# Patient Record
Sex: Female | Born: 1955 | Race: White | Hispanic: No | Marital: Married | Smoking: Former smoker
Health system: Southern US, Community
[De-identification: ages and names within clinical notes are randomized; demographics above are authoritative.]

## PROBLEM LIST (undated history)

## (undated) DIAGNOSIS — F17200 Nicotine dependence, unspecified, uncomplicated: Secondary | ICD-10-CM

## (undated) DIAGNOSIS — E785 Hyperlipidemia, unspecified: Secondary | ICD-10-CM

## (undated) DIAGNOSIS — M109 Gout, unspecified: Secondary | ICD-10-CM

## (undated) DIAGNOSIS — I7409 Other arterial embolism and thrombosis of abdominal aorta: Secondary | ICD-10-CM

## (undated) DIAGNOSIS — M810 Age-related osteoporosis without current pathological fracture: Secondary | ICD-10-CM

## (undated) DIAGNOSIS — E119 Type 2 diabetes mellitus without complications: Secondary | ICD-10-CM

## (undated) DIAGNOSIS — I10 Essential (primary) hypertension: Secondary | ICD-10-CM

## (undated) DIAGNOSIS — I350 Nonrheumatic aortic (valve) stenosis: Secondary | ICD-10-CM

## (undated) DIAGNOSIS — K5792 Diverticulitis of intestine, part unspecified, without perforation or abscess without bleeding: Secondary | ICD-10-CM

## (undated) DIAGNOSIS — E538 Deficiency of other specified B group vitamins: Secondary | ICD-10-CM

## (undated) DIAGNOSIS — G4730 Sleep apnea, unspecified: Secondary | ICD-10-CM

## (undated) DIAGNOSIS — K56609 Unspecified intestinal obstruction, unspecified as to partial versus complete obstruction: Secondary | ICD-10-CM

## (undated) DIAGNOSIS — R011 Cardiac murmur, unspecified: Secondary | ICD-10-CM

## (undated) DIAGNOSIS — M1990 Unspecified osteoarthritis, unspecified site: Secondary | ICD-10-CM

## (undated) HISTORY — DX: Sleep apnea, unspecified: G47.30

## (undated) HISTORY — DX: Age-related osteoporosis without current pathological fracture: M81.0

## (undated) HISTORY — DX: Deficiency of other specified B group vitamins: E53.8

## (undated) HISTORY — PX: OTHER SURGICAL HISTORY: SHX169

## (undated) HISTORY — DX: Hyperlipidemia, unspecified: E78.5

## (undated) HISTORY — DX: Nicotine dependence, unspecified, uncomplicated: F17.200

## (undated) HISTORY — PX: JOINT REPLACEMENT: SHX530

## (undated) HISTORY — DX: Essential (primary) hypertension: I10

## (undated) HISTORY — PX: ELBOW SURGERY: SHX618

## (undated) HISTORY — DX: Other arterial embolism and thrombosis of abdominal aorta: I74.09

## (undated) HISTORY — DX: Unspecified intestinal obstruction, unspecified as to partial versus complete obstruction: K56.609

## (undated) MED FILL — Ezetimibe Tab 10 MG: ORAL | Fill #4 | Status: CN

## (undated) MED FILL — Ezetimibe Tab 10 MG: ORAL | Fill #3 | Status: CN

---

## 1998-01-18 ENCOUNTER — Ambulatory Visit (HOSPITAL_COMMUNITY): Admission: RE | Admit: 1998-01-18 | Discharge: 1998-01-18 | Payer: Self-pay | Admitting: Family Medicine

## 1998-05-07 HISTORY — PX: OTHER SURGICAL HISTORY: SHX169

## 1998-07-07 ENCOUNTER — Encounter: Payer: Self-pay | Admitting: Vascular Surgery

## 1998-07-08 ENCOUNTER — Ambulatory Visit: Admission: RE | Admit: 1998-07-08 | Discharge: 1998-07-08 | Payer: Self-pay | Admitting: Vascular Surgery

## 1998-08-03 ENCOUNTER — Encounter: Payer: Self-pay | Admitting: Vascular Surgery

## 1998-08-03 ENCOUNTER — Inpatient Hospital Stay: Admission: RE | Admit: 1998-08-03 | Discharge: 1998-08-07 | Payer: Self-pay | Admitting: Vascular Surgery

## 1998-08-04 ENCOUNTER — Encounter: Payer: Self-pay | Admitting: Vascular Surgery

## 2000-06-24 ENCOUNTER — Emergency Department (HOSPITAL_COMMUNITY): Admission: EM | Admit: 2000-06-24 | Discharge: 2000-06-24 | Payer: Self-pay | Admitting: Emergency Medicine

## 2000-06-24 ENCOUNTER — Encounter: Payer: Self-pay | Admitting: Emergency Medicine

## 2002-05-03 ENCOUNTER — Inpatient Hospital Stay (HOSPITAL_COMMUNITY): Admission: EM | Admit: 2002-05-03 | Discharge: 2002-05-05 | Payer: Self-pay | Admitting: Emergency Medicine

## 2002-05-03 ENCOUNTER — Encounter: Payer: Self-pay | Admitting: Emergency Medicine

## 2002-05-04 ENCOUNTER — Encounter: Payer: Self-pay | Admitting: Surgery

## 2002-05-05 ENCOUNTER — Encounter: Payer: Self-pay | Admitting: Surgery

## 2002-07-17 ENCOUNTER — Other Ambulatory Visit: Admission: RE | Admit: 2002-07-17 | Discharge: 2002-07-17 | Payer: Self-pay | Admitting: Family Medicine

## 2002-08-02 ENCOUNTER — Ambulatory Visit (HOSPITAL_BASED_OUTPATIENT_CLINIC_OR_DEPARTMENT_OTHER): Admission: RE | Admit: 2002-08-02 | Discharge: 2002-08-02 | Payer: Self-pay | Admitting: Family Medicine

## 2002-09-21 ENCOUNTER — Ambulatory Visit (HOSPITAL_BASED_OUTPATIENT_CLINIC_OR_DEPARTMENT_OTHER): Admission: RE | Admit: 2002-09-21 | Discharge: 2002-09-21 | Payer: Self-pay | Admitting: Family Medicine

## 2004-08-02 ENCOUNTER — Encounter: Admission: RE | Admit: 2004-08-02 | Discharge: 2004-08-02 | Payer: Self-pay | Admitting: Orthopedic Surgery

## 2004-10-09 ENCOUNTER — Ambulatory Visit (HOSPITAL_COMMUNITY): Admission: RE | Admit: 2004-10-09 | Discharge: 2004-10-10 | Payer: Self-pay | Admitting: Orthopedic Surgery

## 2006-05-07 HISTORY — PX: EYE SURGERY: SHX253

## 2007-05-19 ENCOUNTER — Emergency Department (HOSPITAL_COMMUNITY): Admission: EM | Admit: 2007-05-19 | Discharge: 2007-05-19 | Payer: Self-pay | Admitting: Emergency Medicine

## 2007-05-21 ENCOUNTER — Emergency Department (HOSPITAL_COMMUNITY): Admission: EM | Admit: 2007-05-21 | Discharge: 2007-05-22 | Payer: Self-pay | Admitting: Emergency Medicine

## 2007-06-09 ENCOUNTER — Emergency Department (HOSPITAL_COMMUNITY): Admission: EM | Admit: 2007-06-09 | Discharge: 2007-06-09 | Payer: Self-pay | Admitting: Emergency Medicine

## 2007-07-09 ENCOUNTER — Other Ambulatory Visit: Admission: RE | Admit: 2007-07-09 | Discharge: 2007-07-09 | Payer: Self-pay | Admitting: Family Medicine

## 2008-06-07 ENCOUNTER — Encounter: Admission: RE | Admit: 2008-06-07 | Discharge: 2008-06-07 | Payer: Self-pay | Admitting: Family Medicine

## 2010-09-22 NOTE — H&P (Signed)
NAME:  Diane Giles, Diane Giles             ACCOUNT NO.:  [id number]   MEDICAL RECORD NO.:  [id number]                   PATIENT TYPE:  EMS   LOCATION:  ED                                   FACILITY:  [facility]   PHYSICIAN:  Thornton Park. Daphine Deutscher, M.D.             DATE OF BIRTH:  [birth year]   DATE OF ADMISSION:  05/03/2002  DATE OF DISCHARGE:                                HISTORY & PHYSICAL   CHIEF COMPLAINT:  Abdominal pain, nausea and vomiting.   HISTORY OF PRESENT ILLNESS:  Diane Giles is a 55 year old white female who  was seen in the emergency room initially coming in on Sunday, May 03, 2002 at about 10:00 a.m., with about a 24 hour history of abdominal pain,  nausea and vomiting. She has denied any flatus but has continued to have  burping and vomiting. By the time I saw her at 1500 hours, she had been  fairly well medicated and her pain was pretty well resolved. The patient was  described as generalized abdominal pain. It did not radiate into her back  and was associated with nausea and vomiting.   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 This was done by Dr. Waverly Ferrari.  Also she has a past history of tubal ligation. The open aortic bypass  followed a failed Stent placement. The patient at that time was having  claudication but denies any claudication at this time.   ALLERGIES:  No known drug allergies.   CURRENT MEDICATIONS:  None.   REVIEW OF SYSTEMS:  Negative for seizure problems, lung problems including  cough or pneumonia. Denies reflux. Denies chest pain. Denies history of  ulcer disease or bleeding per rectum or mouth. Denies any biliary tract  problems. Positive for kidney stones. Positive for previous vascular  occlusion. This is probably related to her past history of smoking. Denies  any recent unexplained weight loss. The patient has had some night sweats,  however.   PHYSICAL  EXAMINATION:  GENERAL: Slightly overweight white female.  HEENT: Head normocephalic. Eyes, sclera nonicteric. Pupils are equal, round,  and reactive to light and accommodation. Extraocular muscles intact. Nose  and throat examination unremarkable. Mucous membranes are slightly dry.  NECK: Supple.  CHEST: Clear to auscultation and percussion.  HEART: Sinus rhythm without murmur, rub, or gallop.  ABDOMEN: Mildly distended but soft. No rebound or guarding is noted. The  patient has a well healed midline incision without palpable hernia.  EXTREMITIES: Full range of motion without clubbing, cyanosis, or edema.  NEURO: Alert and oriented times three. Motor and sensory function are  grossly intact.   DIAGNOSTIC IMPRESSION:  CT scan was reviewed by me and does show some  distended loops of small bowel proximally with transition. There is some  fecal material in the distal sigmoid colon. Abdominal x-ray's show some  dilated loops of small bowel proximally.   LABORATORY DATA:  Includes hemoglobin of  14.4. WBC count 14,400.  Electrolytes normal with potassium of 4.7. Creatinine is 0.7. Liver function  studies are all within normal limits and amylase is normal at 21. UA is  negative.   IMPRESSION:  Probable partial small bowel obstruction possibly related to  her prior aortic surgery.   PLAN:  1. NG tube.  2. Fleet's enema to try to evaluate the sigmoid colon.  3. Observation.  4. Hydration.                                               Thornton Park Daphine Deutscher, M.D.    MBM/MEDQ  D:  05/03/2002  T:  05/03/2002  Job:  [id number]   cc:   Jethro Bastos, M.D.  [address]  [place]  [place] [address]  Fax: [phone]   Di Kindle. Edilia Bo, M.D.  [address]  [place]  [place] [address]  Fax: [phone]

## 2010-09-22 NOTE — Discharge Summary (Signed)
Diane Giles, Diane Giles             ACCOUNT NO.:  [id number]   MEDICAL RECORD NO.:  [id number]                   PATIENT TYPE:  INP   LOCATION:  0253                                 FACILITY:  [facility]   PHYSICIAN:  Thornton Park. Daphine Deutscher, M.D.             DATE OF BIRTH:  [birth year]   DATE OF ADMISSION:  05/03/2002  DATE OF DISCHARGE:  05/05/2002                                 DISCHARGE SUMMARY   ADMITTING CHIEF COMPLAINT:  Abdominal pain, nausea and vomiting.   DIAGNOSIS:  Probable gastroenteritis versus partial small-bowel obstruction.   DISCHARGE DIAGNOSES:  Probable gastroenteritis versus partial small-bowel  obstruction, resolved.   HOSPITAL COURSE:  For full details of admission history and physical, please  see the typed admission note.   Briefly, the patient is a 55 year old lady who was admitted through the  emergency room with a 24-hour history of abdominal pain, nausea and  vomiting.  Her past medical history was significant in that she has had a  previous aortic graft and bypass by Dr. Di Kindle. Edilia Bo.   The patient was placed on NG suction and observed.  X-rays taken on May 04, 2002 showed gas in the small bowel and in the colon and was  nondistended.  NG tube was removed and she was begun on clear liquids.  She  tolerated those fine.  Followup x-ray showed gas in the colon and the  patient continued to pass flatus.  The abdomen was flat.  Hemoglobin at the  time of discharge was 11.6 with a white count of 9700.  Impression is that this is a resolved gastroenteritis versus possible  partial small-bowel obstruction that has resolved.  The patient was apprised  of this and advised to stay on liquids for the next couple of days before  gradually resuming a regular diet.  She was also instructed to follow up  with Dr. Jethro Bastos at the [facility].   FINAL DIAGNOSIS:  Gastroenteritis, resolved, versus possible partial small-  bowel obstruction, resolved.   PLAN:  Home on full liquids, to advance as tolerated and follow up with Dr.  Dorothe Pea as needed.                                               Thornton Park Daphine Deutscher, M.D.    MBM/MEDQ  D:  05/05/2002  T:  05/05/2002  Job:  [id number]   cc:   Jethro Bastos, M.D.  [address]  [place]  [place] [address]  Fax: [phone]

## 2010-09-22 NOTE — Op Note (Signed)
Diane Giles, CERVIN NO.:  [id number]   MEDICAL RECORD NO.:  [id number]          PATIENT TYPE:  OIB   LOCATION:  2861                         FACILITY:  [facility]   PHYSICIAN:  Almedia Balls. Ranell Patrick, M.D. DATE OF BIRTH:  [birth year]   DATE OF PROCEDURE:  10/09/2004  DATE OF DISCHARGE:                                 OPERATIVE REPORT   PREOPERATIVE DIAGNOSIS:  Left elbow extensor carpi radialis brevis tear.   POSTOPERATIVE DIAGNOSIS:  Left elbow extensor carpi radialis brevis tear.   OPERATION PERFORMED:  Left elbow lateral epicondylar debridement with  extensor carpi radialis brevis repair, lateral extensor mechanism repair.   SURGEON:  Almedia Balls. Ranell Patrick, M.D.   ASSISTANT:  Donnie Coffin. Durwin Nora, P.A.   ANESTHESIA:  General.   ESTIMATED BLOOD LOSS:  Minimal.   FLUIDS REPLACED:  1200 cc crystalloid.   INSTRUMENT COUNT:  Correct.   COMPLICATIONS:  None.   ANTIBIOTICS:  Perioperative antibiotics were given.   TOURNIQUET TIME:  38 minutes.   INDICATIONS FOR PROCEDURE:  The patient is a 55 year old female who presents  with refractory left elbow pain.  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  The patient presents now for operative repair of her  lateral extensor mechanism and debridement of her lateral epicondyle.  She  has failed conservative management, risks and benefits  of surgery versus  nonsurgical treatment were discussed.  The patient desired surgical  treatment.  Informed consent obtained.   DESCRIPTION OF PROCEDURE:  After an adequate level of anesthesia was  achieved.  The patient was positioned supine on the operating table.  Radiolucent hand table was utilized.  After sterile prep and drape and  exsanguination of limb using Esmarch bandage, the tourniquet was elevated to  275 mmHg.  A longitudinal skin incision was created overlying the extensors  in the  lateral condyle.  Dissection carried down under loupe magnification,  through subcutaneous tissues.  The crossing superficial nerve branches were  identified and protected.  We identified the junction between the extensor  carpi radialis longus and extensor digitorum communis and incised that  revealing a partially torn extensor carpi radialis brevis with extensive  fibrofatty scar tissue present at the origin.  We debrided that and excised  that back to normal healthy tendinous attachment being careful to stay  anterior to the lateral ligament.  We also stayed out of the elbow joint as  that appeared pristine.  At least the capsule did from the superficial side.  We then prepared the epicondyle for reattachment with perforating the cortex  with a 1 mm drill bit in multiple places.  At this point we repaired the  common extensors in a pants over vest fashion taking the extensor digitorum  communis underneath the extensor digitorum longus again in a pants over vest  technique that placed healthy tendon directly down to bone.  This was done  with multiple interrupted mattress sutures utilizing 2-0 FiberWire suture.  At this point we closed the subcutaneous tissues using 2-0 Vicryl and 4-0  running Monocryl for skin.  Steri-Strips and sterile dressing applied  followed by a long arm splint with the wrist extended and the elbow about 80  degrees neutral forearm rotation.  The patient was taken to the recovery  room in stable condition.      SRN/MEDQ  D:  10/09/2004  T:  10/09/2004  Job:  [id number]

## 2011-01-24 LAB — DIFFERENTIAL
Basophils Absolute: 0
Basophils Absolute: 0.1
Basophils Relative: 0
Basophils Relative: 1
Eosinophils Absolute: 0.2
Eosinophils Absolute: 0.3
Eosinophils Relative: 2
Eosinophils Relative: 2
Lymphocytes Relative: 24
Lymphocytes Relative: 29
Lymphs Abs: 2.9
Lymphs Abs: 3.5
Monocytes Absolute: 0.6
Monocytes Absolute: 0.7
Monocytes Relative: 5
Monocytes Relative: 6
Neutro Abs: 7.5
Neutro Abs: 8.4 — ABNORMAL HIGH
Neutrophils Relative %: 63
Neutrophils Relative %: 69

## 2011-01-24 LAB — I-STAT 8, (EC8 V) (CONVERTED LAB)
Acid-base deficit: 1
BUN: 13
BUN: 7
Bicarbonate: 25.7 — ABNORMAL HIGH
Bicarbonate: 26.4 — ABNORMAL HIGH
Chloride: 107
Chloride: 110
Glucose, Bld: 92
Glucose, Bld: 98
HCT: 43
HCT: 46
Hemoglobin: 14.6
Hemoglobin: 15.6 — ABNORMAL HIGH
Operator id: 198171
Operator id: 257131
Potassium: 4
Potassium: 5.4 — ABNORMAL HIGH
Sodium: 137
Sodium: 139
TCO2: 27
TCO2: 28
pCO2, Ven: 46.7
pCO2, Ven: 48.1
pH, Ven: 7.336 — ABNORMAL HIGH
pH, Ven: 7.361 — ABNORMAL HIGH

## 2011-01-24 LAB — POCT CARDIAC MARKERS
CKMB, poc: <1 — ABNORMAL LOW
CKMB, poc: <1 — ABNORMAL LOW
Myoglobin, poc: 30.7
Myoglobin, poc: 41.6
Operator id: 146091
Operator id: 257131
Troponin i, poc: <0.05
Troponin i, poc: <0.05

## 2011-01-24 LAB — CBC
HCT: 39.9
HCT: 43.6
Hemoglobin: 13.6
Hemoglobin: 15.1 — ABNORMAL HIGH
MCHC: 34.2
MCHC: 34.6
MCV: 90.9
MCV: 91.6
Platelets: 336
Platelets: ADEQUATE
RBC: 4.35
RBC: 4.79
RDW: 13
RDW: 13.3
WBC: 11.9 — ABNORMAL HIGH
WBC: 12.1 — ABNORMAL HIGH

## 2011-01-24 LAB — POCT I-STAT CREATININE
Creatinine, Ser: 0.6
Creatinine, Ser: 0.9
Operator id: 198171
Operator id: 257131

## 2011-01-24 LAB — URINALYSIS, ROUTINE W REFLEX MICROSCOPIC
Bilirubin Urine: NEGATIVE
Glucose, UA: NEGATIVE
Hgb urine dipstick: NEGATIVE
Ketones, ur: NEGATIVE
Nitrite: NEGATIVE
Protein, ur: NEGATIVE
Specific Gravity, Urine: 1.004 — ABNORMAL LOW
Urobilinogen, UA: 0.2
pH: 7.5

## 2011-01-24 LAB — POTASSIUM: Potassium: 3.9

## 2011-01-26 LAB — CBC
HCT: 37.6
Hemoglobin: 13
MCHC: 34.5
MCV: 90.8
Platelets: 327
RBC: 4.14
RDW: 13.2
WBC: 10.6 — ABNORMAL HIGH

## 2011-01-26 LAB — DIFFERENTIAL
Basophils Absolute: 0.2 — ABNORMAL HIGH
Basophils Relative: 2 — ABNORMAL HIGH
Eosinophils Absolute: 0.2
Eosinophils Relative: 2
Lymphocytes Relative: 22
Lymphs Abs: 2.3
Monocytes Absolute: 0.5
Monocytes Relative: 5
Neutro Abs: 7.5
Neutrophils Relative %: 70

## 2011-01-26 LAB — URINALYSIS, ROUTINE W REFLEX MICROSCOPIC
Bilirubin Urine: NEGATIVE
Glucose, UA: NEGATIVE
Hgb urine dipstick: NEGATIVE
Ketones, ur: NEGATIVE
Nitrite: NEGATIVE
Protein, ur: NEGATIVE
Specific Gravity, Urine: 1.007
Urobilinogen, UA: 0.2
pH: 6.5

## 2011-01-26 LAB — COMPREHENSIVE METABOLIC PANEL
ALT: 15
AST: 14
Albumin: 3.2 — ABNORMAL LOW
Alkaline Phosphatase: 114
BUN: 8
CO2: 25
Calcium: 9.2
Chloride: 108
Creatinine, Ser: 0.52
GFR calc Af Amer: >60
GFR calc non Af Amer: >60
Glucose, Bld: 100 — ABNORMAL HIGH
Potassium: 4.2
Sodium: 140
Total Bilirubin: 0.4
Total Protein: 6

## 2011-01-26 LAB — CK: Total CK: 41

## 2011-01-26 LAB — SEDIMENTATION RATE: Sed Rate: 32 — ABNORMAL HIGH

## 2012-07-04 ENCOUNTER — Ambulatory Visit: Payer: 59

## 2012-07-04 ENCOUNTER — Ambulatory Visit (INDEPENDENT_AMBULATORY_CARE_PROVIDER_SITE_OTHER): Payer: 59 | Admitting: Family Medicine

## 2012-07-04 ENCOUNTER — Encounter: Payer: Self-pay | Admitting: Family Medicine

## 2012-07-04 VITALS — BP 150/72 | HR 94 | Temp 97.6°F | Resp 16 | Ht 60.0 in | Wt 211.0 lb

## 2012-07-04 DIAGNOSIS — F17200 Nicotine dependence, unspecified, uncomplicated: Secondary | ICD-10-CM

## 2012-07-04 DIAGNOSIS — M7061 Trochanteric bursitis, right hip: Secondary | ICD-10-CM

## 2012-07-04 DIAGNOSIS — M810 Age-related osteoporosis without current pathological fracture: Secondary | ICD-10-CM | POA: Insufficient documentation

## 2012-07-04 DIAGNOSIS — M549 Dorsalgia, unspecified: Secondary | ICD-10-CM

## 2012-07-04 DIAGNOSIS — M545 Low back pain: Secondary | ICD-10-CM

## 2012-07-04 DIAGNOSIS — Z1231 Encounter for screening mammogram for malignant neoplasm of breast: Secondary | ICD-10-CM

## 2012-07-04 DIAGNOSIS — G4733 Obstructive sleep apnea (adult) (pediatric): Secondary | ICD-10-CM

## 2012-07-04 DIAGNOSIS — Z0000 Encounter for general adult medical examination without abnormal findings: Secondary | ICD-10-CM

## 2012-07-04 HISTORY — DX: Nicotine dependence, unspecified, uncomplicated: F17.200

## 2012-07-04 LAB — CBC WITH DIFFERENTIAL/PLATELET
HCT: 39.6 % (ref 36.0–46.0)
Hemoglobin: 14.2 g/dL (ref 12.0–15.0)
Lymphocytes Relative: 27 % (ref 12–46)
Monocytes Absolute: 0.5 10*3/uL (ref 0.1–1.0)
Monocytes Relative: 5 % (ref 3–12)
Neutro Abs: 7.3 10*3/uL (ref 1.7–7.7)
WBC: 11.1 10*3/uL — ABNORMAL HIGH (ref 4.0–10.5)

## 2012-07-04 LAB — COMPREHENSIVE METABOLIC PANEL
ALT: 18 U/L (ref 0–35)
AST: 15 U/L (ref 0–37)
Albumin: 4 g/dL (ref 3.5–5.2)
BUN: 9 mg/dL (ref 6–23)
Calcium: 9.5 mg/dL (ref 8.4–10.5)
Chloride: 103 mEq/L (ref 96–112)
Potassium: 4.2 mEq/L (ref 3.5–5.3)

## 2012-07-04 LAB — TSH: TSH: 2.694 u[IU]/mL (ref 0.350–4.500)

## 2012-07-04 LAB — GLUCOSE, POCT (MANUAL RESULT ENTRY): POC Glucose: 82 mg/dl (ref 70–99)

## 2012-07-04 NOTE — Progress Notes (Signed)
Subjective:    Patient ID: Diane Giles, female    DOB: 1956/04/04, 57 y.o.   MRN: [id number]   Chief Complaint  Patient presents with  . Annual Exam    no pap    HPI  Last pap was 2 yrs at [facility] - was normal.  Will do next year. No h/o abnormal pap semar Also had mammogram and bone density tests done 2 yrs ago.  Was on daily forteo shots for about a yr then insurance changed and did not continue for the second yr or have bone density repeated. Colonoscopy was 2 yrs ago - removed a few polpys but not concerning - unsure when she needs repeat - prob 5 yrs.  No h/o prob w/ BP.  Is fasting now Smoking for 30 yrs Is trying vaporizer which works well at home and on weekend  - husband smokes as well as well as many people at work which is stressful.  No smoking in the house.  Insurance would cover wellbutrin and patches didn't work. Would like to loose weight  Is having hip pain in right - has been going on for over a yr - avoids walking as painful to go more than out to car.  Did have some back problems about 2 wks ago.  No falls or injuries - back just seized up. No h/o back problems or falls prev. No radiation down legs. No numbness or weakness. Back pain is getting much better but hip pain is really limiting her - esp her walking.  Did have her tetanus 2 yrs ago, never gets a flu shot, no h/o pneumovax or zoster.  Planning on a trip to [place] in about 1-2 mos so needs to change to a CPAP machine that is portable, can travel with her.  History reviewed. No pertinent family history. Osteoporosis in mother - possibly grandmother. Past Surgical History  Procedure Laterality Date  . Eye surgery  lasik  . Lower aortic bypass      per patient   No current outpatient prescriptions on file prior to visit.   No current facility-administered medications on file prior to visit.   No Known Allergies  History reviewed. No pertinent family history. History   Social History   . Marital Status: Married    Spouse Name: N/A    Number of Children: N/A  . Years of Education: N/A   Occupational History  . Tree surgeon    Social History Main Topics  . Smoking status: Current Every Day Smoker -- 1.00 packs/day    Types: Cigarettes  . Smokeless tobacco: None  . Alcohol Use: No  . Drug Use: No  . Sexually Active: Yes -- Female partner(s)   Other Topics Concern  . None   Social History Narrative   Married. Patient does not exercise. She has a Geographical information systems officer.    Review of Systems  Respiratory: Positive for apnea.   Musculoskeletal: Positive for myalgias, back pain, joint swelling, arthralgias and gait problem.  All other systems reviewed and are negative.       Objective:   Physical Exam  Constitutional: She is oriented to person, place, and time. She appears well-developed and well-nourished. No distress.  HENT:  Head: Normocephalic and atraumatic.  Right Ear: Tympanic membrane, external ear and ear canal normal.  Left Ear: Tympanic membrane, external ear and ear canal normal.  Nose: Nose normal. No mucosal edema or rhinorrhea.  Mouth/Throat: Uvula is midline, oropharynx is clear and moist  and mucous membranes are normal. No posterior oropharyngeal erythema.  Eyes: Conjunctivae and EOM are normal. Pupils are equal, round, and reactive to light. Right eye exhibits no discharge. Left eye exhibits no discharge. No scleral icterus.  Neck: Normal range of motion. Neck supple. No thyromegaly present.  Cardiovascular: Normal rate, regular rhythm, normal heart sounds and intact distal pulses.   Pulmonary/Chest: Effort normal and breath sounds normal. No respiratory distress.  Musculoskeletal: She exhibits edema and tenderness.       Right hip: She exhibits tenderness. She exhibits normal range of motion, normal strength, no swelling, no crepitus and no deformity.       Left hip: Normal.       Lumbar back: She exhibits decreased range of motion, tenderness,  bony tenderness, pain and spasm. She exhibits no swelling, no deformity and no laceration.  Severe point tenderness that replicates sxs with palpation of lateral greater trochanteric head.  Lymphadenopathy:    She has no cervical adenopathy.  Neurological: She is alert and oriented to person, place, and time. She exhibits normal muscle tone.  Skin: Skin is warm and dry. She is not diaphoretic. No erythema.  Psychiatric: She has a normal mood and affect. Her behavior is normal.      [facility] reading (PRIMARY) by  Dr. Clelia Croft. Osteoporosis. No acute lumbar abnormality. Some L5-S1 disc space narrowing.  Assessment & Plan:  elev BP - pt will check outside of office  Obstructive sleep apnea - resmed S9 AutoSet with H51 Humidifier with pressure setting 7-14 cm H2O prescribed so that pt can use while traveling  Osteoporosis, unspecified - repeat dexa  Morbid obesity -cbg nml  Routine general medical examination at a health care facility - Plan: POCT glucose (manual entry), Vitamin D 25 hydroxy, Comprehensive metabolic panel, Lipid panel, TSH, CBC with Differential, refer for mammogram, pap smear next yr, rec shingles vaccine at pharmacy.  Lumbago - suspect MSK - pt at high risk for vertebral compression fracture but luckily none seen on xray. Continue with rest, heat, gentle strethcing  Trochanteric bursitis - handout given for home exercises. Rec cortisone injection - pt will RTC for this on another date - will sched in 2 wks.  Tobacco abuse - Encouraged cessation - cont vaporizer.  No orders of the defined types were placed in this encounter.

## 2012-07-04 NOTE — Progress Notes (Signed)
Subjective:    Patient ID: Diane Giles, female    DOB: [birth year], 58 y.o.   MRN: [id number]  HPI    Review of Systems  Constitutional: Positive for activity change.  HENT: Negative.   Eyes: Negative.   Respiratory: Positive for apnea.   Cardiovascular: Negative.   Gastrointestinal: Negative.   Endocrine: Negative.   Genitourinary: Negative.   Neurological: Negative.   Hematological: Negative.   Psychiatric/Behavioral: Negative.        Objective:   Physical Exam        Assessment & Plan:

## 2012-07-04 NOTE — Patient Instructions (Addendum)
Check your blood pressure outside of the office.  If it is >140/90, please return to clinic so we can talk about trying medications.   Next time you are at the pharmacy, I recommend getting a shingles vaccine.  Keeping You Healthy  Get These Tests  Blood Pressure- Have your blood pressure checked by your healthcare provider at least once a year.  Normal blood pressure is 120/80.  Weight- Have your body mass index (BMI) calculated to screen for obesity.  BMI is a measure of body fat based on height and weight.  You can calculate your own BMI at https://www.west-esparza.com/  Cholesterol- Have your cholesterol checked every year.  Diabetes- Have your blood sugar checked every year if you have high blood pressure, high cholesterol, a family history of diabetes or if you are overweight.  Pap Smear- Have a pap smear every 1 to 3 years if you have been sexually active.  If you are older than 65 and recent pap smears have been normal you may not need additional pap smears.  In addition, if you have had a hysterectomy  For benign disease additional pap smears are not necessary.  Mammogram-Yearly mammograms are essential for early detection of breast cancer  Screening for Colon Cancer- Colonoscopy starting at age 45. Screening may begin sooner depending on your family history and other health conditions.  Follow up colonoscopy as directed by your Gastroenterologist.  Screening for Osteoporosis- Screening begins at age 51 with bone density scanning, sooner if you are at higher risk for developing Osteoporosis.  Get these medicines  Calcium with Vitamin D- Your body requires 1200-1500 mg of Calcium a day and [phone] IU of Vitamin D a day.  You can only absorb 500 mg of Calcium at a time therefore Calcium must be taken in 2 or 3 separate doses throughout the day.  Hormones- Hormone therapy has been associated with increased risk for certain cancers and heart disease.  Talk to your healthcare provider  about if you need relief from menopausal symptoms.  Aspirin- Ask your healthcare provider about taking Aspirin to prevent Heart Disease and Stroke.  Get these Immuniztions  Flu shot- Every fall  Pneumonia shot- Once after the age of 74; if you are younger ask your healthcare provider if you need a pneumonia shot.  Tetanus- Every ten years.  Zostavax- Once after the age of 71 to prevent shingles.  Take these steps  Don't smoke- Your healthcare provider can help you quit. For tips on how to quit, ask your healthcare provider or go to www.smokefree.gov or call 1-800 QUIT-NOW.  Be physically active- Exercise 5 days a week for a minimum of 30 minutes.  If you are not already physically active, start slow and gradually work up to 30 minutes of moderate physical activity.  Try walking, dancing, bike riding, swimming, etc.  Eat a healthy diet- Eat a variety of healthy foods such as fruits, vegetables, whole grains, low fat milk, low fat cheeses, yogurt, lean meats, chicken, fish, eggs, dried beans, tofu, etc.  For more information go to www.thenutritionsource.org  Dental visit- Brush and floss teeth twice daily; visit your dentist twice a year.  Eye exam- Visit your Optometrist or Ophthalmologist yearly.  Drink alcohol in moderation- Limit alcohol intake to one drink or less a day.  Never drink and drive.  Depression- Your emotional health is as important as your physical health.  If you're feeling down or losing interest in things you normally enjoy, please talk to your healthcare  provider.  Seat Belts- can save your life; always wear one  Smoke/Carbon Monoxide detectors- These detectors need to be installed on the appropriate level of your home.  Replace batteries at least once a year.  Violence- If anyone is threatening or hurting you, please tell your healthcare provider.  Living Will/ Health care power of attorney- Discuss with your healthcare provider and family.  Trochanteric  Bursitis You have hip pain due to trochanteric bursitis. Bursitis means that the sack near the outside of the hip is filled with fluid and inflamed. This sack is made up of protective soft tissue. The pain from trochanteric bursitis can be severe and keep you from sleep. It can radiate to the buttocks or down the outside of the thigh to the knee. The pain is almost always worse when rising from the seated or lying position and with walking. Pain can improve after you take a few steps. It happens more often in people with hip joint and lumbar spine problems, such as arthritis or previous surgery. Very rarely the trochanteric bursa can become infected, and antibiotics and/or surgery may be needed. Treatment often includes an injection of local anesthetic mixed with cortisone medicine. This medicine is injected into the area where it is most tender over the hip. Repeat injections may be necessary if the response to treatment is slow. You can apply ice packs over the tender area for 30 minutes every 2 hours for the next few days. Anti-inflammatory and/or narcotic pain medicine may also be helpful. Limit your activity for the next few days if the pain continues. See your caregiver in 5-10 days if you are not greatly improved.  SEEK IMMEDIATE MEDICAL CARE IF:  You develop severe pain, fever, or increased redness.  You have pain that radiates below the knee. EXERCISES STRETCHING EXERCISES - Trochantic Bursitis  These exercises may help you when beginning to rehabilitate your injury. Your symptoms may resolve with or without further involvement from your physician, physical therapist or athletic trainer. While completing these exercises, remember:   Restoring tissue flexibility helps normal motion to return to the joints. This allows healthier, less painful movement and activity.  An effective stretch should be held for at least 30 seconds.  A stretch should never be painful. You should only feel a gentle  lengthening or release in the stretched tissue. STRETCH  Iliotibial Band  On the floor or bed, lie on your side so your injured leg is on top. Bend your knee and grab your ankle.  Slowly bring your knee back so that your thigh is in line with your trunk. Keep your heel at your buttocks and gently arch your back so your head, shoulders and hips line up.  Slowly lower your leg so that your knee approaches the floor/bed until you feel a gentle stretch on the outside of your thigh. If you do not feel a stretch and your knee will not fall farther, place the heel of your opposite foot on top of your knee and pull your thigh down farther.  Hold this stretch for __________ seconds.  Repeat __________ times. Complete this exercise __________ times per day. STRETCH Hamstrings, Supine   Lie on your back. Loop a belt or towel over the ball of your foot as shown.  Straighten your knee and slowly pull on the belt to raise your injured leg. Do not allow the knee to bend. Keep your opposite leg flat on the floor.  Raise the leg until you feel a gentle  stretch behind your knee or thigh. Hold this position for __________ seconds.  Repeat __________ times. Complete this stretch __________ times per day. STRETCH - Quadriceps, Prone   Lie on your stomach on a firm surface, such as a bed or padded floor.  Bend your knee and grasp your ankle. If you are unable to reach, your ankle or pant leg, use a belt around your foot to lengthen your reach.  Gently pull your heel toward your buttocks. Your knee should not slide out to the side. You should feel a stretch in the front of your thigh and/or knee.  Hold this position for __________ seconds.  Repeat __________ times. Complete this stretch __________ times per day. STRETCHING - Hip Flexors, Lunge Half kneel with your knee on the floor and your opposite knee bent and directly over your ankle.  Keep good posture with your head over your shoulders. Tighten  your buttocks to point your tailbone downward; this will prevent your back from arching too much.  You should feel a gentle stretch in the front of your thigh and/or hip. If you do not feel any resistance, slightly slide your opposite foot forward and then slowly lunge forward so your knee once again lines up over your ankle. Be sure your tailbone remains pointed downward.  Hold this stretch for __________ seconds.  Repeat __________ times. Complete this stretch __________ times per day. STRETCH - Adductors, Lunge  While standing, spread your legs  Lean away from your injured leg by bending your opposite knee. You may rest your hands on your thigh for balance.  You should feel a stretch in your inner thigh. Hold for __________ seconds.  Repeat __________ times. Complete this exercise __________ times per day. Document Released: 05/31/2004 Document Revised: 07/16/2011 Document Reviewed: 08/05/2008 [facility] Patient Information 2013 [place], [place].   Joint Injection Care After Refer to this sheet in the next few days. These instructions provide you with information on caring for yourself after you have had a joint injection. Your caregiver also may give you more specific instructions. Your treatment has been planned according to current medical practices, but problems sometimes occur. Call your caregiver if you have any problems or questions after your procedure. After any type of joint injection, it is not uncommon to experience:  Soreness, swelling, or bruising around the injection site.  Mild numbness, tingling, or weakness around the injection site caused by the numbing medicine used before or with the injection. It also is possible to experience the following effects associated with the specific agent after injection:  Iodine-based contrast agents:  Allergic reaction (itching, hives, widespread redness, and swelling beyond the injection site).  Corticosteroids (These effects are  rare.):  Allergic reaction.  Increased blood sugar levels (If you have diabetes and you notice that your blood sugar levels have increased, notify your caregiver).  Increased blood pressure levels.  Mood swings.  Hyaluronic acid in the use of viscosupplementation.  Temporary heat or redness.  Temporary rash and itching.  Increased fluid accumulation in the injected joint. These effects all should resolve within a day after your procedure.  HOME CARE INSTRUCTIONS  Limit yourself to light activity the day of your procedure. Avoid lifting heavy objects, bending, stooping, or twisting.  Take prescription or over-the-counter pain medication as directed by your caregiver.  You may apply ice to your injection site to reduce pain and swelling the day of your procedure. Ice may be applied 3 to 4 times:  Put ice in a plastic  bag.  Place a towel between your skin and the bag.  Leave the ice on for no longer than 15 to 20 minutes each time. SEEK IMMEDIATE MEDICAL CARE IF:   Pain and swelling get worse rather than better or extend beyond the injection site.  Numbness does not go away.  Blood or fluid continues to leak from the injection site.  You have chest pain.  You have swelling of your face or tongue.  You have trouble breathing or you become dizzy.  You develop a fever, chills, or severe tenderness at the injection site that last longer than 1 day. MAKE SURE YOU:  Understand these instructions.  Watch your condition.  Get help right away if you are not doing well or if you get worse. Document Released: 01/04/2011 Document Revised: 07/16/2011 Document Reviewed: 01/04/2011 [facility] Patient Information 2013 [place], [place].

## 2012-07-05 LAB — VITAMIN D 25 HYDROXY (VIT D DEFICIENCY, FRACTURES): Vit D, 25-Hydroxy: 20 ng/mL — ABNORMAL LOW (ref 30–89)

## 2012-07-07 ENCOUNTER — Other Ambulatory Visit: Payer: Self-pay | Admitting: Family Medicine

## 2012-07-07 DIAGNOSIS — E559 Vitamin D deficiency, unspecified: Secondary | ICD-10-CM

## 2012-07-07 DIAGNOSIS — M810 Age-related osteoporosis without current pathological fracture: Secondary | ICD-10-CM

## 2012-07-07 MED ORDER — ERGOCALCIFEROL 1.25 MG (50000 UT) PO CAPS: 50000.0000 [IU] | ORAL_CAPSULE | ORAL | Status: DC

## 2012-07-18 ENCOUNTER — Ambulatory Visit (INDEPENDENT_AMBULATORY_CARE_PROVIDER_SITE_OTHER): Payer: 59 | Admitting: Family Medicine

## 2012-07-18 ENCOUNTER — Encounter: Payer: Self-pay | Admitting: Family Medicine

## 2012-07-18 VITALS — BP 146/78 | HR 103 | Temp 97.9°F | Resp 18 | Ht 58.5 in | Wt 206.6 lb

## 2012-07-18 DIAGNOSIS — M7061 Trochanteric bursitis, right hip: Secondary | ICD-10-CM

## 2012-07-18 DIAGNOSIS — M76899 Other specified enthesopathies of unspecified lower limb, excluding foot: Secondary | ICD-10-CM

## 2012-07-18 DIAGNOSIS — E559 Vitamin D deficiency, unspecified: Secondary | ICD-10-CM

## 2012-07-18 DIAGNOSIS — E785 Hyperlipidemia, unspecified: Secondary | ICD-10-CM | POA: Insufficient documentation

## 2012-07-18 DIAGNOSIS — I10 Essential (primary) hypertension: Secondary | ICD-10-CM | POA: Insufficient documentation

## 2012-07-18 DIAGNOSIS — E8881 Metabolic syndrome: Secondary | ICD-10-CM

## 2012-07-18 MED ORDER — CHLORTHALIDONE 25 MG PO TABS: 25.0000 mg | ORAL_TABLET | Freq: Every day | ORAL | Status: DC

## 2012-07-18 MED ORDER — METHYLPREDNISOLONE ACETATE 40 MG/ML IJ SUSP
40.0000 mg | Freq: Once | INTRAMUSCULAR | Status: AC
  Administered 2012-07-18: 40 mg via INTRA_ARTICULAR

## 2012-07-18 NOTE — Patient Instructions (Addendum)
You may want to consider starting a red yeast rice supplement with coenzyme q10 to help lower your cholesterol. The best thing to reduce your risk of heart disease is to stop smoking. Lets start you on a diuretic to lower your blood pressure. If your blood pressure is still running >130s or higher and your pulse is consistently in >90s, we may want to consider adding in the atenolol (which comes in a combo- pill with the diuretic chlorthalidone.)  Increase your diet in potassium since this can lower your K. Do as best as you can on your diet and hopefully increasing your exercise will help too.  If you are still having sig hip pains, lets think about getting you to PT or ortho. We will recheck your cholesterol at the beginning of July.  Foods Rich in Potassium Food / Potassium (mg)  Apricots, dried,  cup / 378 mg   Apricots, raw, 1 cup halves / 401 mg   Avocado,  / 487 mg   Banana, 1 large / 487 mg   Beef, lean, round, 3 oz / 202 mg   Cantaloupe, 1 cup cubes / 427 mg   Dates, medjool, 5 whole / 835 mg   Ham, cured, 3 oz / 212 mg   Lentils, dried,  cup / 458 mg   Lima beans, frozen,  cup / 258 mg   Orange, 1 large / 333 mg   Orange juice, 1 cup / 443 mg   Peaches, dried,  cup / 398 mg   Peas, split, cooked,  cup / 355 mg   Potato, boiled, 1 medium / 515 mg   Prunes, dried, uncooked,  cup / 318 mg   Raisins,  cup / 309 mg   Salmon, pink, raw, 3 oz / 275 mg   Sardines, canned , 3 oz / 338 mg   Tomato, raw, 1 medium / 292 mg   Tomato juice, 6 oz / 417 mg   [place], 3 oz / 349 mg  Document Released: 04/23/2005 Document Revised: 01/03/2011 Document Reviewed: 09/06/2008 [facility] Patient Information 2012 [place], [place].

## 2012-07-29 DIAGNOSIS — E8881 Metabolic syndrome: Secondary | ICD-10-CM | POA: Insufficient documentation

## 2012-07-29 DIAGNOSIS — E559 Vitamin D deficiency, unspecified: Secondary | ICD-10-CM | POA: Insufficient documentation

## 2012-07-29 NOTE — Progress Notes (Signed)
Subjective:    Patient ID: Diane Giles, female    DOB: 1955-12-08, 57 y.o.   MRN: [id number] Chief Complaint  Patient presents with  . Injections    HPI  Pt continues to have right hip pain, - esp bad with walking - really limiting her activity.  She is planning a HUGE [place] vacation in several wks and so we have decided to proceed with cortisone injection for trochanteric bursitis in hopes of more immediate relief. She has had injections into her knees prior so is somewhat familiar with the procedure.  Pt wonders if she should just start on a medication for the HPL - her LDL was 227.  She has done some research on a low chol diet like I recommended and has tried to make some changes but she is getting really sick of just eating veggies and chicken breasts - really misses red meat - used to eat a lot of it and really enjoyed it.  Past Medical History  Diagnosis Date  . Osteoporosis   . Sleep apnea    Current Outpatient Prescriptions on File Prior to Visit  Medication Sig Dispense Refill  . ergocalciferol (VITAMIN D2) 50000 UNITS capsule Take 1 capsule (50,000 Units total) by mouth once a week.  4 capsule  4   No current facility-administered medications on file prior to visit.   No Known Allergies   Review of Systems  Constitutional: Positive for activity change and fatigue. Negative for fever, chills, diaphoresis and appetite change.  Eyes: Negative for visual disturbance.  Respiratory: Negative for cough and shortness of breath.   Cardiovascular: Negative for chest pain, palpitations and leg swelling.  Genitourinary: Negative for decreased urine volume.  Musculoskeletal: Positive for myalgias, arthralgias and gait problem.  Neurological: Negative for syncope and headaches.  Hematological: Does not bruise/bleed easily.      BP 146/78  Pulse 103  Temp(Src) 97.9 F (36.6 C) (Oral)  Resp 18  Ht 4' 10.5" (1.486 m)  Wt 206 lb 9.6 oz (93.713 kg)  BMI 42.44 kg/m2  SpO2  96% Objective:   Physical Exam  Constitutional: She is oriented to person, place, and time. She appears well-developed and well-nourished. No distress.  HENT:  Head: Normocephalic and atraumatic.  Right Ear: External ear normal.  Left Ear: External ear normal.  Eyes: Conjunctivae are normal. No scleral icterus.  Neck: Normal range of motion. Neck supple. No thyromegaly present.  Cardiovascular: Normal rate, regular rhythm, normal heart sounds and intact distal pulses.   Pulmonary/Chest: Effort normal and breath sounds normal. No respiratory distress.  Musculoskeletal: She exhibits no edema.       Right hip: She exhibits tenderness and bony tenderness. She exhibits normal range of motion, no crepitus and no deformity.  Lymphadenopathy:    She has no cervical adenopathy.  Neurological: She is alert and oriented to person, place, and time.  Skin: Skin is warm and dry. She is not diaphoretic. No erythema.  Psychiatric: She has a normal mood and affect. Her behavior is normal.      Risks/benefits of injection reviewed and verbal consent obtained.  Patient positioned onto her left side with hips flexed to 45 deg and knees positioned at 90 deg flexion. Areas of most tenderness over lateral right trochanteric head identified by palpation and marked. Cleaned with betadine x 2 and alcohol x 1.  Anesthesia w/ ethyl chloride cold spray. Injected perpendicular to femur with 40mg  of DepoMedrol and 5cc of 1% plain lidocaine using 23g 1  1/2in needle without complications.  Unfortunately, this is the longest needle we hand and due to girth I was not able to touch bone with the needle. Pt tolerated procedure well. No EBL.     Assessment & Plan:  Trochanteric bursitis of right hip - Plan: methylPREDNISolone acetate (DEPO-MEDROL) injection 40 mg - Attempted bursa injection today with cortisone and lidocaine. Unfortunately, I am not sure how successful it will be as the needles that were available for use did  not allow me to reach bone thereby feeling confident that I had successfully entered the bursa. If pt continues to have pain, consider referral to ortho for injection vs PT.  Essential hypertension, benign - Plan: chlorthalidone (HYGROTON) 25 MG tablet - BP continues to be elev so start therapy with chlorthalidone. High k diet discussed. Check BP and bmp in several wks. Continue to monitor BP at home and call or RTC if over >140/90 or any symptomatic lows.  Hyperlipidemia - Pt has A LOT of potentional improvement in TLC she could make through diet, exercise, and smoking cessation.  I am reluctant to start her on a statin immed as she does not have any known heart disease and her glucose is fine so a statin may actually be more likely to trigger DM/glucose intolerance than it is likely to prevent a MI.  I want pt to cont to work on TLC and recheck lipids in 3-4 mos. Then we will again decide if she is a candidate for therapy. Currently, her Framingham score puts her at 16%.  Meds ordered this encounter  Medications  . chlorthalidone (HYGROTON) 25 MG tablet    Sig: Take 1 tablet (25 mg total) by mouth daily.    Dispense:  30 tablet    Refill:  3  . methylPREDNISolone acetate (DEPO-MEDROL) injection 40 mg    Sig:

## 2012-08-04 ENCOUNTER — Encounter: Payer: Self-pay | Admitting: Family Medicine

## 2012-08-09 ENCOUNTER — Other Ambulatory Visit: Payer: Self-pay | Admitting: Family Medicine

## 2012-08-09 ENCOUNTER — Other Ambulatory Visit: Payer: Self-pay | Admitting: *Deleted

## 2012-08-09 MED ORDER — ATENOLOL-CHLORTHALIDONE 50-25 MG PO TABS: 1.0000 | ORAL_TABLET | Freq: Every day | ORAL | Status: DC

## 2012-10-16 ENCOUNTER — Telehealth: Payer: Self-pay

## 2012-10-16 MED ORDER — ATENOLOL-CHLORTHALIDONE 50-25 MG PO TABS: 1.0000 | ORAL_TABLET | Freq: Every day | ORAL | Status: DC

## 2012-10-16 NOTE — Telephone Encounter (Signed)
Pt out of atenolol (bp med). Called today and scheduled next available appt with Dr. Clelia Croft for 7/11. Requesting one month refill  [facility]  Pt [phone]

## 2012-10-16 NOTE — Telephone Encounter (Signed)
Done

## 2012-10-17 NOTE — Telephone Encounter (Signed)
Notified pt RF sent. 

## 2012-10-17 NOTE — Telephone Encounter (Signed)
Tried to call pt. VM not set up on number pt left. Also called H # and it rang but no answ/no VM.

## 2012-11-14 ENCOUNTER — Ambulatory Visit (INDEPENDENT_AMBULATORY_CARE_PROVIDER_SITE_OTHER): Payer: 59 | Admitting: Family Medicine

## 2012-11-14 ENCOUNTER — Encounter: Payer: Self-pay | Admitting: Family Medicine

## 2012-11-14 VITALS — BP 130/84 | HR 63 | Temp 98.4°F | Resp 16 | Ht 59.0 in | Wt 188.0 lb

## 2012-11-14 DIAGNOSIS — I10 Essential (primary) hypertension: Secondary | ICD-10-CM

## 2012-11-14 DIAGNOSIS — E785 Hyperlipidemia, unspecified: Secondary | ICD-10-CM

## 2012-11-14 DIAGNOSIS — F17200 Nicotine dependence, unspecified, uncomplicated: Secondary | ICD-10-CM

## 2012-11-14 DIAGNOSIS — E8881 Metabolic syndrome: Secondary | ICD-10-CM

## 2012-11-14 DIAGNOSIS — E559 Vitamin D deficiency, unspecified: Secondary | ICD-10-CM

## 2012-11-14 DIAGNOSIS — M810 Age-related osteoporosis without current pathological fracture: Secondary | ICD-10-CM

## 2012-11-14 LAB — CBC
Hemoglobin: 14.3 g/dL (ref 12.0–15.0)
MCH: 31 pg (ref 26.0–34.0)
MCV: 88.1 fL (ref 78.0–100.0)
RBC: 4.61 MIL/uL (ref 3.87–5.11)

## 2012-11-14 MED ORDER — ATENOLOL-CHLORTHALIDONE 50-25 MG PO TABS: 1.0000 | ORAL_TABLET | Freq: Every day | ORAL | Status: DC

## 2012-11-14 NOTE — Progress Notes (Signed)
Subjective:    Patient ID: Diane Giles, female    DOB: [birth year], 57 y.o.   MRN: [id number]  HPI Saw Dr. Ranell Patrick who said she did have right trochanteric bursitis but that the additional hip pain was actually coming from her back - has started PT and she is doing really well. Still smoking about 1/2 ppd.  Just finished high dose vitamin D supp.  Not checking BP outside of the office. Doing well on low chol diet - lots of chicken and veggies.  Has lost over 20 lls since she started this.  Has not sched her mammogram and dexa scan  Yet.  About 3 yrs ago diagnosed w/ diverticulitis through ER visit but Wynelle Link she had 1 day of no appetite, nausea, horrible abd pains, and diarrhea, and then no BM since Sun (but not eating that much).  Got reminder that she needs to schedule for her colonscopy - last was 5 yrs ago.  Sees Dr. Elnoria Howard.  Past Medical History  Diagnosis Date  . Osteoporosis   . Sleep apnea    Current Outpatient Prescriptions on File Prior to Visit  Medication Sig Dispense Refill  . ergocalciferol (VITAMIN D2) 50000 UNITS capsule Take 1 capsule (50,000 Units total) by mouth once a week.  4 capsule  4   No current facility-administered medications on file prior to visit.   No Known Allergies  Review of Systems  Constitutional: Positive for activity change and appetite change. Negative for unexpected weight change.  Gastrointestinal: Positive for abdominal pain, constipation and abdominal distention. Negative for vomiting, blood in stool and anal bleeding.  Musculoskeletal: Positive for back pain and arthralgias. Negative for gait problem.      BP 130/84  Pulse 63  Temp(Src) 98.4 F (36.9 C) (Oral)  Resp 16  Ht 4\' 11"  (1.499 m)  Wt 188 lb (85.276 kg)  BMI 37.95 kg/m2  SpO2 97% Objective:   Physical Exam  Constitutional: She is oriented to person, place, and time. She appears well-developed and well-nourished. No distress.  HENT:  Head: Normocephalic and atraumatic.   Right Ear: External ear normal.  Left Ear: External ear normal.  Eyes: Conjunctivae are normal. No scleral icterus.  Neck: Normal range of motion. Neck supple. No thyromegaly present.  Cardiovascular: Normal rate, regular rhythm, normal heart sounds and intact distal pulses.   Pulmonary/Chest: Effort normal and breath sounds normal. No respiratory distress.  Musculoskeletal: She exhibits no edema.  Lymphadenopathy:    She has no cervical adenopathy.  Neurological: She is alert and oriented to person, place, and time.  Skin: Skin is warm and dry. She is not diaphoretic. No erythema.  Psychiatric: She has a normal mood and affect. Her behavior is normal.      Assessment & Plan:  Will plan to schedule bone density, mammogram, and colonoscopy.  Essential hypertension, benign - Plan: CBC, Comprehensive metabolic panel - much improved control on atenolol-chlorthalidone - cont.  Hyperlipidemia - Plan: Lipid panel - has been working very hard at [facility] and has lost a lot of weight - >20 lbs!! - check flp to see if she can cont w/ TLC or needs chol med.  Metabolic syndrome  Osteoporosis, unspecified - get dexa  Tobacco use disorder - encouraged cessation  Unspecified vitamin D deficiency - Plan: Vitamin D, 25-hydroxy  Meds ordered this encounter  Medications  . atenolol-chlorthalidone (TENORETIC) 50-25 MG per tablet    Sig: Take 1 tablet by mouth daily.    Dispense:  90 tablet  Refill:  1    Order Specific Question:  Supervising Provider    Answer:  Ethelda Chick [[id number]]

## 2012-11-14 NOTE — Patient Instructions (Signed)
Make your appointment for your bone density and mammogram before out next visit!

## 2012-11-15 LAB — COMPREHENSIVE METABOLIC PANEL
AST: 11 U/L (ref 0–37)
Albumin: 4.1 g/dL (ref 3.5–5.2)
Alkaline Phosphatase: 95 U/L (ref 39–117)
BUN: 9 mg/dL (ref 6–23)
Potassium: 3.6 mEq/L (ref 3.5–5.3)
Sodium: 138 mEq/L (ref 135–145)
Total Bilirubin: 0.3 mg/dL (ref 0.3–1.2)

## 2012-11-15 LAB — LIPID PANEL
Cholesterol: 280 mg/dL — ABNORMAL HIGH (ref 0–200)
Total CHOL/HDL Ratio: 8.5 Ratio
VLDL: 34 mg/dL (ref 0–40)

## 2012-11-17 ENCOUNTER — Encounter: Payer: Self-pay | Admitting: Family Medicine

## 2012-11-18 ENCOUNTER — Other Ambulatory Visit: Payer: Self-pay | Admitting: Family Medicine

## 2012-11-18 DIAGNOSIS — Z79899 Other long term (current) drug therapy: Secondary | ICD-10-CM

## 2012-11-18 MED ORDER — PRAVASTATIN SODIUM 40 MG PO TABS: 40.0000 mg | ORAL_TABLET | Freq: Every day | ORAL | Status: DC

## 2012-12-15 ENCOUNTER — Encounter: Payer: Self-pay | Admitting: Family Medicine

## 2012-12-25 LAB — HM COLONOSCOPY: HM Colonoscopy: 12

## 2012-12-30 ENCOUNTER — Telehealth: Payer: Self-pay

## 2012-12-30 DIAGNOSIS — E559 Vitamin D deficiency, unspecified: Secondary | ICD-10-CM

## 2012-12-30 NOTE — Telephone Encounter (Signed)
PT STATES SHE E-MAILED DR Clelia Croft THE OTHER DAY ABOUT A TEST SHE WANTED HER TO ORDER AND HASN'T HEARD FROM HER, THE TEST IS SCHEDULED FOR TOMORROW AND SHE REALLY NEED TO KNOW SOMETHING. PLEASE CALL [phone]

## 2012-12-30 NOTE — Telephone Encounter (Signed)
Put in order there is already order in for this, can you make sure the breast center has gotten them

## 2012-12-31 ENCOUNTER — Other Ambulatory Visit (INDEPENDENT_AMBULATORY_CARE_PROVIDER_SITE_OTHER): Payer: 59 | Admitting: *Deleted

## 2012-12-31 DIAGNOSIS — Z79899 Other long term (current) drug therapy: Secondary | ICD-10-CM

## 2012-12-31 NOTE — Progress Notes (Signed)
Patient here for labs only. 

## 2013-01-01 LAB — COMPREHENSIVE METABOLIC PANEL
ALT: 10 U/L (ref 0–35)
AST: 11 U/L (ref 0–37)
CO2: 31 mEq/L (ref 19–32)
Chloride: 100 mEq/L (ref 96–112)
Creat: 0.63 mg/dL (ref 0.50–1.10)
Potassium: 3.7 mEq/L (ref 3.5–5.3)
Total Bilirubin: 0.4 mg/dL (ref 0.3–1.2)
Total Protein: 6.7 g/dL (ref 6.0–8.3)

## 2013-03-12 ENCOUNTER — Other Ambulatory Visit: Payer: Self-pay

## 2013-03-18 ENCOUNTER — Encounter: Payer: Self-pay | Admitting: Family Medicine

## 2013-03-18 DIAGNOSIS — IMO0002 Reserved for concepts with insufficient information to code with codable children: Secondary | ICD-10-CM | POA: Insufficient documentation

## 2013-03-31 ENCOUNTER — Encounter: Payer: Self-pay | Admitting: Family Medicine

## 2013-05-01 ENCOUNTER — Encounter: Payer: Self-pay | Admitting: Family Medicine

## 2013-05-01 DIAGNOSIS — M810 Age-related osteoporosis without current pathological fracture: Secondary | ICD-10-CM

## 2013-05-29 ENCOUNTER — Encounter: Payer: Self-pay | Admitting: Family Medicine

## 2013-06-06 ENCOUNTER — Other Ambulatory Visit: Payer: Self-pay | Admitting: Family Medicine

## 2013-07-10 ENCOUNTER — Other Ambulatory Visit: Payer: Self-pay | Admitting: Physician Assistant

## 2013-07-13 ENCOUNTER — Encounter: Payer: Self-pay | Admitting: Family Medicine

## 2013-07-13 MED ORDER — PRAVASTATIN SODIUM 40 MG PO TABS
40.0000 mg | ORAL_TABLET | Freq: Every day | ORAL | Status: DC
Start: ? — End: 2014-03-01

## 2013-07-13 MED ORDER — ATENOLOL-CHLORTHALIDONE 50-25 MG PO TABS
1.0000 | ORAL_TABLET | Freq: Every day | ORAL | Status: DC
Start: ? — End: 2014-03-01

## 2013-07-13 NOTE — Telephone Encounter (Signed)
Dr Brigitte Pulse, do you want to RF until pt can see you in May, or have her come for f/up w/another provider?

## 2013-07-15 NOTE — Telephone Encounter (Signed)
Pt brought paperwork to office to be completed for a handicap placard, however she will need an office for a re-evaluation to have this completed.

## 2013-08-26 ENCOUNTER — Encounter (HOSPITAL_COMMUNITY): Payer: Self-pay | Admitting: Emergency Medicine

## 2013-08-26 ENCOUNTER — Emergency Department (HOSPITAL_COMMUNITY): Payer: 59

## 2013-08-26 ENCOUNTER — Inpatient Hospital Stay (HOSPITAL_COMMUNITY)
Admission: EM | Admit: 2013-08-26 | Discharge: 2013-08-28 | DRG: 389 | Disposition: A | Discharge status: Home / Self Care | Payer: 59 | Attending: Internal Medicine | Admitting: Internal Medicine

## 2013-08-26 DIAGNOSIS — G4733 Obstructive sleep apnea (adult) (pediatric): Secondary | ICD-10-CM

## 2013-08-26 DIAGNOSIS — K5732 Diverticulitis of large intestine without perforation or abscess without bleeding: Secondary | ICD-10-CM

## 2013-08-26 DIAGNOSIS — E785 Hyperlipidemia, unspecified: Secondary | ICD-10-CM

## 2013-08-26 DIAGNOSIS — R109 Unspecified abdominal pain: Secondary | ICD-10-CM | POA: Diagnosis present

## 2013-08-26 DIAGNOSIS — K56609 Unspecified intestinal obstruction, unspecified as to partial versus complete obstruction: Principal | ICD-10-CM | POA: Diagnosis present

## 2013-08-26 DIAGNOSIS — K5792 Diverticulitis of intestine, part unspecified, without perforation or abscess without bleeding: Secondary | ICD-10-CM | POA: Diagnosis present

## 2013-08-26 DIAGNOSIS — F17200 Nicotine dependence, unspecified, uncomplicated: Secondary | ICD-10-CM | POA: Diagnosis present

## 2013-08-26 DIAGNOSIS — I10 Essential (primary) hypertension: Secondary | ICD-10-CM | POA: Diagnosis present

## 2013-08-26 DIAGNOSIS — Z79899 Other long term (current) drug therapy: Secondary | ICD-10-CM

## 2013-08-26 DIAGNOSIS — K5712 Diverticulitis of small intestine without perforation or abscess without bleeding: Secondary | ICD-10-CM | POA: Diagnosis present

## 2013-08-26 DIAGNOSIS — D72829 Elevated white blood cell count, unspecified: Secondary | ICD-10-CM | POA: Diagnosis present

## 2013-08-26 DIAGNOSIS — E876 Hypokalemia: Secondary | ICD-10-CM | POA: Diagnosis present

## 2013-08-26 HISTORY — DX: Diverticulitis of intestine, part unspecified, without perforation or abscess without bleeding: K57.92

## 2013-08-26 LAB — CBC WITH DIFFERENTIAL/PLATELET
Basophils Absolute: 0 10*3/uL (ref 0.0–0.1)
Basophils Relative: 0 % (ref 0–1)
Eosinophils Absolute: 0 10*3/uL (ref 0.0–0.7)
Eosinophils Relative: 0 % (ref 0–5)
HCT: 44 % (ref 36.0–46.0)
HEMOGLOBIN: 15.3 g/dL — AB (ref 12.0–15.0)
LYMPHS ABS: 2.9 10*3/uL (ref 0.7–4.0)
LYMPHS PCT: 14 % (ref 12–46)
MCH: 31.3 pg (ref 26.0–34.0)
MCHC: 34.8 g/dL (ref 30.0–36.0)
MCV: 90 fL (ref 78.0–100.0)
MONOS PCT: 5 % (ref 3–12)
Monocytes Absolute: 1.1 10*3/uL — ABNORMAL HIGH (ref 0.1–1.0)
NEUTROS ABS: 16.7 10*3/uL — AB (ref 1.7–7.7)
NEUTROS PCT: 81 % — AB (ref 43–77)
PLATELETS: 325 10*3/uL (ref 150–400)
RBC: 4.89 MIL/uL (ref 3.87–5.11)
RDW: 13.1 % (ref 11.5–15.5)
WBC: 20.8 10*3/uL — AB (ref 4.0–10.5)

## 2013-08-26 LAB — URINALYSIS, ROUTINE W REFLEX MICROSCOPIC
BILIRUBIN URINE: NEGATIVE
Glucose, UA: NEGATIVE mg/dL
HGB URINE DIPSTICK: NEGATIVE
KETONES UR: NEGATIVE mg/dL
Leukocytes, UA: NEGATIVE
NITRITE: NEGATIVE
PH: 8 (ref 5.0–8.0)
Protein, ur: 30 mg/dL — AB
SPECIFIC GRAVITY, URINE: 1.016 (ref 1.005–1.030)
UROBILINOGEN UA: 0.2 mg/dL (ref 0.0–1.0)

## 2013-08-26 LAB — COMPREHENSIVE METABOLIC PANEL
ALK PHOS: 99 U/L (ref 39–117)
ALT: 13 U/L (ref 0–35)
AST: 11 U/L (ref 0–37)
Albumin: 3.8 g/dL (ref 3.5–5.2)
BILIRUBIN TOTAL: 0.3 mg/dL (ref 0.3–1.2)
BUN: 19 mg/dL (ref 6–23)
CHLORIDE: 95 meq/L — AB (ref 96–112)
CO2: 30 mEq/L (ref 19–32)
Calcium: 9.6 mg/dL (ref 8.4–10.5)
Creatinine, Ser: 0.65 mg/dL (ref 0.50–1.10)
GFR calc non Af Amer: >90 mL/min (ref >90)
GLUCOSE: 137 mg/dL — AB (ref 70–99)
POTASSIUM: 3.5 meq/L — AB (ref 3.7–5.3)
SODIUM: 139 meq/L (ref 137–147)
Total Protein: 7.2 g/dL (ref 6.0–8.3)

## 2013-08-26 LAB — URINE MICROSCOPIC-ADD ON

## 2013-08-26 LAB — LIPASE, BLOOD: LIPASE: 16 U/L (ref 11–59)

## 2013-08-26 MED ORDER — MORPHINE SULFATE 2 MG/ML IJ SOLN: 2.0000 mg | INTRAMUSCULAR | Status: DC | PRN

## 2013-08-26 MED ORDER — METRONIDAZOLE IN NACL 5-0.79 MG/ML-% IV SOLN: 500.0000 mg | Freq: Once | INTRAVENOUS | Status: DC

## 2013-08-26 MED ORDER — HEPARIN SODIUM (PORCINE) 5000 UNIT/ML IJ SOLN
5000.0000 [IU] | Freq: Three times a day (TID) | INTRAMUSCULAR | Status: DC
  Administered 2013-08-27 – 2013-08-28 (×4): 5000 [IU] via SUBCUTANEOUS
  Filled 2013-08-26 (×8): qty 1

## 2013-08-26 MED ORDER — SODIUM CHLORIDE 0.9 % IV BOLUS (SEPSIS)
1000.0000 mL | Freq: Once | INTRAVENOUS | Status: AC
  Administered 2013-08-26: 1000 mL via INTRAVENOUS

## 2013-08-26 MED ORDER — SODIUM CHLORIDE 0.9 % IV SOLN
40.0000 mg | Freq: Every day | INTRAVENOUS | Status: DC
  Administered 2013-08-27 (×2): 40 mg via INTRAVENOUS
  Filled 2013-08-26 (×3): qty 4

## 2013-08-26 MED ORDER — ONDANSETRON HCL 4 MG/2ML IJ SOLN
4.0000 mg | Freq: Once | INTRAMUSCULAR | Status: AC
  Administered 2013-08-26: 4 mg via INTRAVENOUS
  Filled 2013-08-26: qty 2

## 2013-08-26 MED ORDER — IOHEXOL 300 MG/ML  SOLN
100.0000 mL | Freq: Once | INTRAMUSCULAR | Status: AC | PRN
  Administered 2013-08-26: 100 mL via INTRAVENOUS

## 2013-08-26 MED ORDER — POTASSIUM CHLORIDE IN NACL 40-0.9 MEQ/L-% IV SOLN
INTRAVENOUS | Status: DC
Start: 2013-08-26 — End: 2013-08-28
  Administered 2013-08-27 – 2013-08-28 (×3): via INTRAVENOUS
  Filled 2013-08-26 (×4): qty 1000

## 2013-08-26 MED ORDER — CIPROFLOXACIN IN D5W 400 MG/200ML IV SOLN
400.0000 mg | Freq: Once | INTRAVENOUS | Status: AC
  Administered 2013-08-26: 400 mg via INTRAVENOUS
  Filled 2013-08-26: qty 200

## 2013-08-26 MED ORDER — HYDRALAZINE HCL 20 MG/ML IJ SOLN
10.0000 mg | Freq: Four times a day (QID) | INTRAMUSCULAR | Status: DC | PRN
  Filled 2013-08-26: qty 0.5

## 2013-08-26 MED ORDER — CIPROFLOXACIN IN D5W 400 MG/200ML IV SOLN
400.0000 mg | Freq: Two times a day (BID) | INTRAVENOUS | Status: DC
  Administered 2013-08-27 – 2013-08-28 (×3): 400 mg via INTRAVENOUS
  Filled 2013-08-26 (×5): qty 200

## 2013-08-26 MED ORDER — ACETAMINOPHEN 650 MG RE SUPP: 650.0000 mg | Freq: Four times a day (QID) | RECTAL | Status: DC | PRN

## 2013-08-26 MED ORDER — IOHEXOL 300 MG/ML  SOLN
50.0000 mL | Freq: Once | INTRAMUSCULAR | Status: AC | PRN
  Administered 2013-08-26: 50 mL via ORAL

## 2013-08-26 MED ORDER — ACETAMINOPHEN 325 MG PO TABS: 650.0000 mg | ORAL_TABLET | Freq: Four times a day (QID) | ORAL | Status: DC | PRN

## 2013-08-26 MED ORDER — ONDANSETRON HCL 4 MG/2ML IJ SOLN
4.0000 mg | Freq: Four times a day (QID) | INTRAMUSCULAR | Status: DC | PRN
  Administered 2013-08-27: 4 mg via INTRAVENOUS
  Filled 2013-08-26 (×2): qty 2

## 2013-08-26 MED ORDER — MORPHINE SULFATE 4 MG/ML IJ SOLN
4.0000 mg | Freq: Once | INTRAMUSCULAR | Status: AC
  Administered 2013-08-26: 4 mg via INTRAVENOUS
  Filled 2013-08-26: qty 1

## 2013-08-26 MED ORDER — MORPHINE SULFATE 2 MG/ML IJ SOLN
2.0000 mg | Freq: Once | INTRAMUSCULAR | Status: AC
  Administered 2013-08-26: 2 mg via INTRAVENOUS
  Filled 2013-08-26: qty 1

## 2013-08-26 MED ORDER — METRONIDAZOLE IN NACL 5-0.79 MG/ML-% IV SOLN
500.0000 mg | Freq: Three times a day (TID) | INTRAVENOUS | Status: DC
  Administered 2013-08-27 – 2013-08-28 (×5): 500 mg via INTRAVENOUS
  Filled 2013-08-26 (×7): qty 100

## 2013-08-26 MED ORDER — ONDANSETRON HCL 4 MG PO TABS: 4.0000 mg | ORAL_TABLET | Freq: Four times a day (QID) | ORAL | Status: DC | PRN

## 2013-08-26 NOTE — ED Notes (Signed)
US at bedside

## 2013-08-26 NOTE — ED Provider Notes (Signed)
CSN: [id number]     Arrival date & time 08/26/13  1828 History   First MD Initiated Contact with Patient 08/26/13 1843     Chief Complaint  Patient presents with  . Abdominal Pain     (Consider location/radiation/quality/duration/timing/severity/associated sxs/prior Treatment) HPI 58 year old female presents with abdominal pain that started about 9 hours ago. She points to her midline suprapubic area as the location of the main area of pain. She states it feels like when she had diverticulitis one to 2 years ago. The patient states she vomited twice, once after lunch. She's been having constant pain but it does fluctuate it seemed to worse. At this time is about a 7/10. She also feels like she's having "back labor pain". The patient has not really tried to keep down fluids. No diarrhea, blood in her stool, fevers, or dysuria.  Past Medical History  Diagnosis Date  . Osteoporosis   . Sleep apnea   . Diverticulitis    Past Surgical History  Procedure Laterality Date  . Eye surgery  lasik  . Lower aortic bypass      per patient   No family history on file. History  Substance Use Topics  . Smoking status: Current Every Day Smoker -- 1.00 packs/day    Types: Cigarettes  . Smokeless tobacco: Not on file  . Alcohol Use: No   OB History   Grav Para Term Preterm Abortions TAB SAB Ect Mult Living                 Review of Systems  Constitutional: Negative for fever.  Cardiovascular: Negative for chest pain.  Gastrointestinal: Positive for nausea, vomiting and abdominal pain. Negative for diarrhea, constipation and blood in stool.  Genitourinary: Negative for dysuria.  Musculoskeletal: Positive for back pain.  All other systems reviewed and are negative.     Allergies  Review of patient's allergies indicates no known allergies.  Home Medications   Prior to Admission medications   Medication Sig Start Date End Date Taking? Authorizing Provider  atenolol-chlorthalidone  (TENORETIC) 50-25 MG per tablet Take 1 tablet by mouth daily. PATIENT NEEDS OFFICE VISIT FOR ADDITIONAL REFILLS   Yes Theda Sers, PA-C  diclofenac (VOLTAREN) 75 MG EC tablet Take 75 mg by mouth 2 (two) times daily.   Yes Historical Provider, MD  pravastatin (PRAVACHOL) 40 MG tablet Take 1 tablet (40 mg total) by mouth daily. PATIENT NEEDS OFFICE VISIT FOR ADDITIONAL REFILLS   Yes Eleanore E Egan, PA-C   BP 142/57  Pulse 64  Temp(Src) 98.3 F (36.8 C) (Oral)  Resp 17  SpO2 96% Physical Exam  Nursing note and vitals reviewed. Constitutional: She is oriented to person, place, and time. She appears well-developed and well-nourished.  HENT:  Head: Normocephalic and atraumatic.  Right Ear: External ear normal.  Left Ear: External ear normal.  Nose: Nose normal.  Eyes: Right eye exhibits no discharge. Left eye exhibits no discharge.  Cardiovascular: Normal rate, regular rhythm and normal heart sounds.   Pulmonary/Chest: Effort normal and breath sounds normal.  Abdominal: Soft. She exhibits no distension. There is tenderness in the right upper quadrant, epigastric area and left upper quadrant.  Neurological: She is alert and oriented to person, place, and time.  Skin: Skin is warm and dry.    ED Course  Procedures (including critical care time) Labs Review Labs Reviewed  CBC WITH DIFFERENTIAL - Abnormal; Notable for the following:    WBC 20.8 (*)    Hemoglobin 15.3 (*)  Neutrophils Relative % 81 (*)    Neutro Abs 16.7 (*)    Monocytes Absolute 1.1 (*)    All other components within normal limits  COMPREHENSIVE METABOLIC PANEL - Abnormal; Notable for the following:    Potassium 3.5 (*)    Chloride 95 (*)    Glucose, Bld 137 (*)    All other components within normal limits  URINALYSIS, ROUTINE W REFLEX MICROSCOPIC - Abnormal; Notable for the following:    Protein, ur 30 (*)    All other components within normal limits  LIPASE, BLOOD  URINE MICROSCOPIC-ADD ON    Imaging  Review Ct Abdomen Pelvis W Contrast  08/26/2013   CLINICAL DATA:  Low abdominal and back pain.  EXAM: CT ABDOMEN AND PELVIS WITH CONTRAST  TECHNIQUE: Multidetector CT imaging of the abdomen and pelvis was performed using the standard protocol following bolus administration of intravenous contrast.  CONTRAST:  20mL OMNIPAQUE IOHEXOL 300 MG/ML SOLN, 153mL OMNIPAQUE IOHEXOL 300 MG/ML SOLN  COMPARISON:  05/03/2002 by report  FINDINGS: Minimal dependent atelectasis posteriorly in the lung bases. Unremarkable liver, gallbladder, spleen, adrenal glands, kidneys, pancreas. Patchy aortic plaque without stenosis or aneurysm. Small duodenal diverticulum. Stomach is physiologically distended by ingested material.  There are multiple distended mid small bowel loops in the mid abdomen with poor progression of the oral contrast material. Transition point is anteriorly in the right lower abdomen, without any discrete mass or other process. There is a mild regional mesenteric edema. The distal most loops of small bowel and colon are nondilated.  There are few scattered distal descending colon diverticula. There is a small amount of pelvic ascites. Urinary bladder is incompletely distended show normal appendix noted. Uterus and adnexal regions unremarkable. No free air. No adenopathy localized. No hydronephrosis on delayed scans. Regional bones unremarkable.  IMPRESSION: 1. Mid/distal small bowel obstruction, transition point right lower quadrant without evident etiology, suggesting adhesions.   Electronically Signed   By: Arne Cleveland M.D.   On: 08/26/2013 21:45   US Abdomen Limited  08/26/2013   CLINICAL DATA:  Right upper quadrant pain  EXAM: US ABDOMEN LIMITED - RIGHT UPPER QUADRANT  COMPARISON:  None.  FINDINGS: Gallbladder:  No wall thickening or pericholecystic fluid. A 1.0 cm gallstone is present at the neck. Comet tail artifact from the gallbladder wall is noted. Positive Murphy sign.  Common bile duct:  Diameter: 2  mm in caliber.  Liver:  Heterogeneous liver without definite focal mass.  IMPRESSION: Cholelithiasis.  Comet tail artifact from the gallbladder wall compatible with adenomyomatosis.  Heterogeneous liver compatible with diffuse hepatic parenchymal disease versus diffuse hepatic steatosis.  Positive Murphy sign. Correlate clinically as the need for nuclear medicine imaging to evaluate for acute cholecystitis.   Electronically Signed   By: Maryclare Bean M.D.   On: 08/26/2013 19:53     EKG Interpretation None      MDM   Final diagnoses:  SBO (small bowel obstruction)    Patient's evaluation start with ultrasound given that most of her tenderness is in the right upper quadrant. She has cholelithiasis with positive Murphy sign. Do this surgery was consult in. I discussed with Dr. Hassell Done who recommended CT. CT shows the small bowel obstruction. Given the degree of her white blood cell count, after discussion with the hospitalist on surgeon we'll cover with antibiotics for intra-abdominal infection causing this. We'll place NG tube and keep her n.p.o. and give her IV fluids. Surgery was in the morning and will admit to  the hospitalist.    Ephraim Hamburger, MD 08/26/13 2250

## 2013-08-26 NOTE — ED Notes (Signed)
Pt c/o lower abdominal pain that radiates to her lower back. Symptoms started this morning. Pt had a few episodes of n/v earlier. Denies nausea now. Pt states last BM today. Pt states pain is constant but varies in intensity. Pt has hx of diverticulitis and states this feels like the same pain. Pt alert, no acute distress. Skin warm and dry.

## 2013-08-27 ENCOUNTER — Encounter (HOSPITAL_COMMUNITY): Payer: Self-pay | Admitting: General Surgery

## 2013-08-27 ENCOUNTER — Inpatient Hospital Stay (HOSPITAL_COMMUNITY): Payer: 59

## 2013-08-27 DIAGNOSIS — R112 Nausea with vomiting, unspecified: Secondary | ICD-10-CM

## 2013-08-27 DIAGNOSIS — R109 Unspecified abdominal pain: Secondary | ICD-10-CM

## 2013-08-27 LAB — BASIC METABOLIC PANEL
BUN: 13 mg/dL (ref 6–23)
CO2: 29 meq/L (ref 19–32)
Calcium: 8.3 mg/dL — ABNORMAL LOW (ref 8.4–10.5)
Chloride: 100 mEq/L (ref 96–112)
Creatinine, Ser: 0.73 mg/dL (ref 0.50–1.10)
GFR calc Af Amer: >90 mL/min (ref >90)
GFR calc non Af Amer: >90 mL/min (ref >90)
Glucose, Bld: 128 mg/dL — ABNORMAL HIGH (ref 70–99)
Potassium: 3.9 mEq/L (ref 3.7–5.3)
SODIUM: 139 meq/L (ref 137–147)

## 2013-08-27 LAB — CBC
HEMATOCRIT: 41.1 % (ref 36.0–46.0)
Hemoglobin: 13.9 g/dL (ref 12.0–15.0)
MCH: 30.9 pg (ref 26.0–34.0)
MCHC: 33.8 g/dL (ref 30.0–36.0)
MCV: 91.3 fL (ref 78.0–100.0)
Platelets: 324 10*3/uL (ref 150–400)
RBC: 4.5 MIL/uL (ref 3.87–5.11)
RDW: 13.3 % (ref 11.5–15.5)
WBC: 18 10*3/uL — AB (ref 4.0–10.5)

## 2013-08-27 LAB — GLUCOSE, CAPILLARY: GLUCOSE-CAPILLARY: 208 mg/dL — AB (ref 70–99)

## 2013-08-27 NOTE — Progress Notes (Addendum)
Pt brought in her cpap unit from home with nasal mask and prefers to use it while here in the hospital.  No frays on cord or obvious defects noted.  Machine turned on and appears to be working correctly.  Call will be placed for [facility] to inspect in the morning.  Pt did not have humidity chamber with home unit.  Pt stated she doesn't need help with cpap tonight and can place it on herself at bedtime.  Pt was advised that RT is available should she need further assistance.

## 2013-08-27 NOTE — Progress Notes (Signed)
RT went to patients room to ask about her CPAP settings and patient said that she does not want to wear it tonight due to her NG tube. RT will place 2L O2 on patient when she is sleeping.

## 2013-08-27 NOTE — H&P (Signed)
[facility] History and Physical  Diane Giles HYQ:[id number] DOB: June 25, 1955 DOA: 08/26/2013  Referring physician: Dr. Criss Alvine  PCP: Diane Sorenson, MD   Chief Complaint: abd pain, nausea, vomiting  HPI: Diane Giles is a 58 y.o. female past medical history significant for hypertension, hyperlipidemia, diverticulosis (with history of diverticulitis in the past), sleep apnea and remote history of repair AAA; came to the hospital complaining of left lower quadrant abdominal pain with associated nausea and vomiting. Patient reports symptoms at the present for the last 36 hours and worsening. She denies any chest pain, shortness of breath, fever, chills, hematemesis, dysuria, melena, hematochezia or any other acute complaints. Patient reports to be unable to keep anything down secondary to ongoing pain and nausea/vomiting.  In the ED CT of abdomen demonstrated mild colonic inflammation with also signs of SBO affecting distal aspect of her small bowels; CBC with elevated WBC's (20.8K) and mild hypokalemia (K3.5). Patient reports last BM was on day of admission. Patient received pain meds, IV fluids and was started on cipro and flagyl. [facility] called to admit patient for further evaluation and treatment.  Of note, surgery service (Dr. Daphine Deutscher) consulted by ED for this moment recommendations were for NG tube placement, conservative management, [facility] admission and they will evaluate patient in am.    Review of Systems:  Negative except as mentioned on history of present illness  Past Medical History  Diagnosis Date  . Osteoporosis   . Sleep apnea   . Diverticulitis    Past Surgical History  Procedure Laterality Date  . Eye surgery  lasik  . Lower aortic bypass      per patient  . Elbow surgery Left approx 2010    tendon repair by Dr. Ranell Patrick   Social History:  reports that she has been smoking Cigarettes.  She has a 20 pack-year smoking history. She has never used smokeless  tobacco. She reports that she does not drink alcohol or use illicit drugs.  No Known Allergies  Family history: Positive for hypertension and smoking   Prior to Admission medications   Medication Sig Start Date End Date Taking? Authorizing Provider  atenolol-chlorthalidone (TENORETIC) 50-25 MG per tablet Take 1 tablet by mouth daily. PATIENT NEEDS OFFICE VISIT FOR ADDITIONAL REFILLS   Yes Godfrey Pick, PA-C  diclofenac (VOLTAREN) 75 MG EC tablet Take 75 mg by mouth 2 (two) times daily.   Yes Historical Provider, MD  pravastatin (PRAVACHOL) 40 MG tablet Take 1 tablet (40 mg total) by mouth daily. PATIENT NEEDS OFFICE VISIT FOR ADDITIONAL REFILLS   Yes Godfrey Pick, PA-C   Physical Exam: Filed Vitals:   08/26/13 1840  BP: 142/57  Pulse: 64  Temp: 98.3 F (36.8 C)  Resp: 17    BP 142/57  Pulse 64  Temp(Src) 98.3 F (36.8 C) (Oral)  Resp 17  Ht 4\' 11"  (1.499 m)  Wt 85.231 kg (187 lb 14.4 oz)  BMI 37.93 kg/m2  SpO2 96%  General:  Afebrile, uncomfortable due to pain in her abdomen; , awake and oriented x3 Eyes: PERRL, normal lids, irises & conjunctiva; no icterus, no nystagmus ENT: grossly normal hearing, lips & tongue; no erythema or exudates inside her mouth; no drainage out of her ears or nostrils Neck: no LAD, masses or thyromegaly; no bruits or JVD Cardiovascular: RRR, no m/r/g. No LE edema. Respiratory: CTA bilaterally, no w/r/r. Normal respiratory effort. Abdomen: soft, positive BS; patient with LLQ pain to palpation; NGT in place with minimal drainage  Skin: no rash or induration seen on limited exam Musculoskeletal: grossly normal tone BUE/BLE Psychiatric: grossly normal mood and affect, speech fluent and appropriate Neurologic: grossly non-focal.          Labs on Admission:  Basic Metabolic Panel:  Recent Labs Lab 08/26/13 1925  NA 139  K 3.5*  CL 95*  CO2 30  GLUCOSE 137*  BUN 19  CREATININE 0.65  CALCIUM 9.6   Liver Function Tests:  Recent  Labs Lab 08/26/13 1925  AST 11  ALT 13  ALKPHOS 99  BILITOT 0.3  PROT 7.2  ALBUMIN 3.8    Recent Labs Lab 08/26/13 1925  LIPASE 16   CBC:  Recent Labs Lab 08/26/13 1925  WBC 20.8*  NEUTROABS 16.7*  HGB 15.3*  HCT 44.0  MCV 90.0  PLT 325    Radiological Exams on Admission: Ct Abdomen Pelvis W Contrast  08/26/2013   CLINICAL DATA:  Low abdominal and back pain.  EXAM: CT ABDOMEN AND PELVIS WITH CONTRAST  TECHNIQUE: Multidetector CT imaging of the abdomen and pelvis was performed using the standard protocol following bolus administration of intravenous contrast.  CONTRAST:  50mL OMNIPAQUE IOHEXOL 300 MG/ML SOLN, OMNIPAQUE IOHEXOL 300 MG/ML SOLN  COMPARISON:  05/03/2002 by report  FINDINGS: Minimal dependent atelectasis posteriorly in the lung bases. Unremarkable liver, gallbladder, spleen, adrenal glands, kidneys, pancreas. Patchy aortic plaque without stenosis or aneurysm. Small duodenal diverticulum. Stomach is physiologically distended by ingested material.  There are multiple distended mid small bowel loops in the mid abdomen with poor progression of the oral contrast material. Transition point is anteriorly in the right lower abdomen, without any discrete mass or other process. There is a mild regional mesenteric edema. The distal most loops of small bowel and colon are nondilated.  There are few scattered distal descending colon diverticula. There is a small amount of pelvic ascites. Urinary bladder is incompletely distended show normal appendix noted. Uterus and adnexal regions unremarkable. No free air. No adenopathy localized. No hydronephrosis on delayed scans. Regional bones unremarkable.  IMPRESSION: 1. Mid/distal small bowel obstruction, transition point right lower quadrant without evident etiology, suggesting adhesions.   Electronically Signed   By: Oley Balm M.D.   On: 08/26/2013 21:45   US Abdomen Limited  08/26/2013   CLINICAL DATA:  Right upper quadrant  pain  EXAM: US ABDOMEN LIMITED - RIGHT UPPER QUADRANT  COMPARISON:  None.  FINDINGS: Gallbladder:  No wall thickening or pericholecystic fluid. A 1.0 cm gallstone is present at the neck. Comet tail artifact from the gallbladder wall is noted. Positive Murphy sign.  Common bile duct:  Diameter: 2 mm in caliber.  Liver:  Heterogeneous liver without definite focal mass.  IMPRESSION: Cholelithiasis.  Comet tail artifact from the gallbladder wall compatible with adenomyomatosis.  Heterogeneous liver compatible with diffuse hepatic parenchymal disease versus diffuse hepatic steatosis.  Positive Murphy sign. Correlate clinically as the need for nuclear medicine imaging to evaluate for acute cholecystitis.   Electronically Signed   By: Maryclare Bean M.D.   On: 08/26/2013 19:53    EKG:  None  Assessment/Plan 1-abdominal pain, nausea/vomiting: Patient presenting with left lower quadrant pain that radiates to make abdomen and right flank. Workup demonstrated colonic inflammation and SBO. Also with leukocytosis (WBCs in 21,000 range) -Due to ongoing nausea/vomiting NG tube was placed in the ED following surgery service recommendation -Will keep patient n.p.o. -Admit to MedSurg bed -Will provide IV fluid resuscitation and electrolytes repletion -Start empirical treatment for presumed  diverticulitis with Flagyl and Cipro (given leukocytosis and inflammation in colonic area) -PRN antiemetics and analgesics -Will repeat abdominal x-ray in a.m. to follow SBO -will change meds to IV -IV pepcid for GI prophylaxis  2-SBO: Plan is for conservative management as mentioned above. Surgery service has been consulted and will see patient in the morning. Will follow further recommendations.  3-Obstructive sleep apnea: will continue CPAP  4-Tobacco use disorder: Smoking cessation has been provided. Patient declined use of nicotine patch  5-Essential hypertension, benign: While n.p.o. will use as needed hydralazine. BP  currently stable.  6-Hyperlipidemia: Plan is to continue statins once able to take by mouth meds.  7-presumed diverticulitis and Leukocytosis: Patient with history of diverticulitis in the past and some colonic inflammation appreciated on CT of abdomen also with mesenteric edema. -Bowel rest has been initiated -IV Cipro and Flagyl -PRN antiemetics and analgesics  -IV fluid resuscitation    DVT and GI prophylaxis: Heparin and Pepcid   Surgery consulted by ED (Dr. Daphine Deutscher; plan is for surgical service see patient in am) please follow on this consultation   Code Status: Full Family Communication: husband at bedside  Disposition Plan: Med-surg bed, inpatient, LOS > 2 midnights  Time spent: 55 minutes  Vassie Loll [facility] Pager [phone]

## 2013-08-27 NOTE — Progress Notes (Signed)
Patient asleep, spouse in room no needs verbalized at this time

## 2013-08-27 NOTE — Consult Note (Signed)
Seen, agree with above.  Pt does not appear to have bowel obstruction.   Advance diet as tolerated.

## 2013-08-27 NOTE — Consult Note (Signed)
Diane Giles 1955/06/04  [id number].   Requesting MD: Dr. Vassie Loll Chief Complaint/Reason for Consult: SBO HPI: This is a pleasant relatively healthy, 58 yo female who began having some mid abdominal pain yesterday around 10am.  The pain did not migrate anywhere, except to her back.  She felt like she was having "back labor."  She had one episode of nausea and vomiting around 1430pm yesterday.  Denies diarrhea, fevers.  She is still passing flatus and had a normal BM yesterday.  She went to the [facility] last night for evaluation.  She had a CT scan that questioned a small bowel obstruction and maybe some mild inflammation of her sigmoid colon.  She was admitted by medicine and an NGT was placed.  We have been asked to see her for further recommendations.  ROS: Please see HPI, otherwise all other systems are negative.  History reviewed. No pertinent family history.  Past Medical History  Diagnosis Date  . Osteoporosis   . Sleep apnea   . Diverticulitis     Past Surgical History  Procedure Laterality Date  . Eye surgery  lasik  . Lower aortic bypass      per patient  . Elbow surgery Left approx 2010    tendon repair by Dr. Ranell Patrick    Social History:  reports that she has been smoking Cigarettes.  She has a 20 pack-year smoking history. She has never used smokeless tobacco. She reports that she does not drink alcohol or use illicit drugs.  Allergies: No Known Allergies  Medications Prior to Admission  Medication Sig Dispense Refill  . atenolol-chlorthalidone (TENORETIC) 50-25 MG per tablet Take 1 tablet by mouth daily. PATIENT NEEDS OFFICE VISIT FOR ADDITIONAL REFILLS  30 tablet  1  . diclofenac (VOLTAREN) 75 MG EC tablet Take 75 mg by mouth 2 (two) times daily.      . pravastatin (PRAVACHOL) 40 MG tablet Take 1 tablet (40 mg total) by mouth daily. PATIENT NEEDS OFFICE VISIT FOR ADDITIONAL REFILLS  30 tablet  1    Blood pressure 134/51, pulse 59, temperature 98.1 F (36.7 C),  temperature source Oral, resp. rate 18, height 4\' 11"  (1.499 m), weight 187 lb 14.4 oz (85.231 kg), SpO2 97.00%. Physical Exam: General: pleasant, WD, WN, obese white female who is laying in bed in NAD HEENT: head is normocephalic, atraumatic.  Sclera are noninjected.  PERRL.  Ears and nose without any masses or lesions.  Mouth is pink and moist Heart: regular, rate, and rhythm.  Normal s1,s2. No obvious murmurs, gallops, or rubs noted.  Palpable radial and pedal pulses bilaterally Lungs: CTAB, no wheezes, rhonchi, or rales noted.  Respiratory effort nonlabored Abd: soft, minimal RLQ tenderness, no guarding, rebound, ND, +BS, no masses, hernias, or organomegaly.  No NGT output MS: all 4 extremities are symmetrical with no cyanosis, clubbing, or edema. Skin: warm and dry with no masses, lesions, or rashes Psych: A&Ox3 with an appropriate affect.    Results for orders placed during the hospital encounter of 08/26/13 (from the past 48 hour(s))  CBC WITH DIFFERENTIAL     Status: Abnormal   Collection Time    08/26/13  7:25 PM      Result Value Ref Range   WBC 20.8 (*) 4.0 - 10.5 K/uL   RBC 4.89  3.87 - 5.11 MIL/uL   Hemoglobin 15.3 (*) 12.0 - 15.0 g/dL   HCT 91.4  78.2 - 95.6 %   MCV 90.0  78.0 - 100.0 fL  MCH 31.3  26.0 - 34.0 pg   MCHC 34.8  30.0 - 36.0 g/dL   RDW 16.1  09.6 - 04.5 %   Platelets 325  150 - 400 K/uL   Neutrophils Relative % 81 (*) 43 - 77 %   Neutro Abs 16.7 (*) 1.7 - 7.7 K/uL   Lymphocytes Relative 14  12 - 46 %   Lymphs Abs 2.9  0.7 - 4.0 K/uL   Monocytes Relative 5  3 - 12 %   Monocytes Absolute 1.1 (*) 0.1 - 1.0 K/uL   Eosinophils Relative 0  0 - 5 %   Eosinophils Absolute 0.0  0.0 - 0.7 K/uL   Basophils Relative 0  0 - 1 %   Basophils Absolute 0.0  0.0 - 0.1 K/uL  COMPREHENSIVE METABOLIC PANEL     Status: Abnormal   Collection Time    08/26/13  7:25 PM      Result Value Ref Range   Sodium 139  137 - 147 mEq/L   Potassium 3.5 (*) 3.7 - 5.3 mEq/L    Chloride 95 (*) 96 - 112 mEq/L   CO2 30  19 - 32 mEq/L   Glucose, Bld 137 (*) 70 - 99 mg/dL   BUN 19  6 - 23 mg/dL   Creatinine, Ser 4.09  0.50 - 1.10 mg/dL   Calcium 9.6  8.4 - 81.1 mg/dL   Total Protein 7.2  6.0 - 8.3 g/dL   Albumin 3.8  3.5 - 5.2 g/dL   AST 11  0 - 37 U/L   ALT 13  0 - 35 U/L   Alkaline Phosphatase 99  39 - 117 U/L   Total Bilirubin 0.3  0.3 - 1.2 mg/dL   GFR calc non Af Amer >90  >90 mL/min   GFR calc Af Amer >90  >90 mL/min   Comment: (NOTE)     The eGFR has been calculated using the CKD EPI equation.     This calculation has not been validated in all clinical situations.     eGFR's persistently <90 mL/min signify possible Chronic Kidney     Disease.  LIPASE, BLOOD     Status: None   Collection Time    08/26/13  7:25 PM      Result Value Ref Range   Lipase 16  11 - 59 U/L  URINALYSIS, ROUTINE W REFLEX MICROSCOPIC     Status: Abnormal   Collection Time    08/26/13  8:52 PM      Result Value Ref Range   Color, Urine YELLOW  YELLOW   APPearance CLEAR  CLEAR   Specific Gravity, Urine 1.016  1.005 - 1.030   pH 8.0  5.0 - 8.0   Glucose, UA NEGATIVE  NEGATIVE mg/dL   Hgb urine dipstick NEGATIVE  NEGATIVE   Bilirubin Urine NEGATIVE  NEGATIVE   Ketones, ur NEGATIVE  NEGATIVE mg/dL   Protein, ur 30 (*) NEGATIVE mg/dL   Urobilinogen, UA 0.2  0.0 - 1.0 mg/dL   Nitrite NEGATIVE  NEGATIVE   Leukocytes, UA NEGATIVE  NEGATIVE  URINE MICROSCOPIC-ADD ON     Status: None   Collection Time    08/26/13  8:52 PM      Result Value Ref Range   Squamous Epithelial / LPF RARE  RARE  BASIC METABOLIC PANEL     Status: Abnormal   Collection Time    08/27/13  5:00 AM      Result Value Ref Range  Sodium 139  137 - 147 mEq/L   Potassium 3.9  3.7 - 5.3 mEq/L   Chloride 100  96 - 112 mEq/L   CO2 29  19 - 32 mEq/L   Glucose, Bld 128 (*) 70 - 99 mg/dL   BUN 13  6 - 23 mg/dL   Creatinine, Ser 4.01  0.50 - 1.10 mg/dL   Calcium 8.3 (*) 8.4 - 10.5 mg/dL   GFR calc non Af Amer  >90  >90 mL/min   GFR calc Af Amer >90  >90 mL/min   Comment: (NOTE)     The eGFR has been calculated using the CKD EPI equation.     This calculation has not been validated in all clinical situations.     eGFR's persistently <90 mL/min signify possible Chronic Kidney     Disease.  CBC     Status: Abnormal   Collection Time    08/27/13  5:00 AM      Result Value Ref Range   WBC 18.0 (*) 4.0 - 10.5 K/uL   RBC 4.50  3.87 - 5.11 MIL/uL   Hemoglobin 13.9  12.0 - 15.0 g/dL   HCT 02.7  25.3 - 66.4 %   MCV 91.3  78.0 - 100.0 fL   MCH 30.9  26.0 - 34.0 pg   MCHC 33.8  30.0 - 36.0 g/dL   RDW 40.3  47.4 - 25.9 %   Platelets 324  150 - 400 K/uL  GLUCOSE, CAPILLARY     Status: Abnormal   Collection Time    08/27/13  7:45 AM      Result Value Ref Range   Glucose-Capillary 208 (*) 70 - 99 mg/dL   Abd 1 View (kub)  5/63/8756   CLINICAL DATA:  Followup small bowel obstruction  EXAM: ABDOMEN - 1 VIEW  COMPARISON:  CT, 08/26/2013  FINDINGS: There is mild small bowel dilation centrally, but overall improvement compared to the previous day's CT. Air is seen in the colon and rectum.  Vascular clips seen in the central abdomen are stable. Soft tissues are otherwise unremarkable.  IMPRESSION: Improved small bowel obstruction.   Electronically Signed   By: Amie Portland M.D.   On: 08/27/2013 08:46   Ct Abdomen Pelvis W Contrast  08/26/2013   CLINICAL DATA:  Low abdominal and back pain.  EXAM: CT ABDOMEN AND PELVIS WITH CONTRAST  TECHNIQUE: Multidetector CT imaging of the abdomen and pelvis was performed using the standard protocol following bolus administration of intravenous contrast.  CONTRAST:  50mL OMNIPAQUE IOHEXOL 300 MG/ML SOLN, OMNIPAQUE IOHEXOL 300 MG/ML SOLN  COMPARISON:  05/03/2002 by report  FINDINGS: Minimal dependent atelectasis posteriorly in the lung bases. Unremarkable liver, gallbladder, spleen, adrenal glands, kidneys, pancreas. Patchy aortic plaque without stenosis or aneurysm. Small  duodenal diverticulum. Stomach is physiologically distended by ingested material.  There are multiple distended mid small bowel loops in the mid abdomen with poor progression of the oral contrast material. Transition point is anteriorly in the right lower abdomen, without any discrete mass or other process. There is a mild regional mesenteric edema. The distal most loops of small bowel and colon are nondilated.  There are few scattered distal descending colon diverticula. There is a small amount of pelvic ascites. Urinary bladder is incompletely distended show normal appendix noted. Uterus and adnexal regions unremarkable. No free air. No adenopathy localized. No hydronephrosis on delayed scans. Regional bones unremarkable.  IMPRESSION: 1. Mid/distal small bowel obstruction, transition point right lower quadrant without  evident etiology, suggesting adhesions.   Electronically Signed   By: Oley Balm M.D.   On: 08/26/2013 21:45   US Abdomen Limited  08/26/2013   CLINICAL DATA:  Right upper quadrant pain  EXAM: US ABDOMEN LIMITED - RIGHT UPPER QUADRANT  COMPARISON:  None.  FINDINGS: Gallbladder:  No wall thickening or pericholecystic fluid. A 1.0 cm gallstone is present at the neck. Comet tail artifact from the gallbladder wall is noted. Positive Murphy sign.  Common bile duct:  Diameter: 2 mm in caliber.  Liver:  Heterogeneous liver without definite focal mass.  IMPRESSION: Cholelithiasis.  Comet tail artifact from the gallbladder wall compatible with adenomyomatosis.  Heterogeneous liver compatible with diffuse hepatic parenchymal disease versus diffuse hepatic steatosis.  Positive Murphy sign. Correlate clinically as the need for nuclear medicine imaging to evaluate for acute cholecystitis.   Electronically Signed   By: Maryclare Bean M.D.   On: 08/26/2013 19:53       Assessment/Plan 1. Abdominal pain, nausea/vomiting 2. ?PSBO vs gastroenteritis  Plan: 1. The patient has had repeat abdominal films  today that reveal a normal bowel gas pattern.  She is continuing to pass flatus and has had no NGT output.  We will dc her NGT and allow clear liquids.  Due to rapid improvement, wonder if patient had more of an enteritis type picture as opposed to a true bowel obstruction.  There are no plans for surgical intervention.  Her diet can be advanced as tolerated and discharged when felt stable by medicine.  We will sign off.  Thank you for this consultation.  Letha Cape 08/27/2013, 12:57 PM Pager: [phone]

## 2013-08-27 NOTE — Progress Notes (Signed)
Progress Note   Diane Giles NWG:[id number] DOB: [birth year] DOA: 08/26/2013 PCP: Norberto Sorenson, MD   Brief Narrative:   Diane ALDERFER is an 58 y.o. female with a PMH of hypertension, hyperlipidemia, diverticulosis/diverticulitis, OSA, and remote history of repair of AAA who was admitted 08/27/13 with abdominal pain, nausea and vomiting. Upon evaluation in the ED, CT scan of the abdomen demonstrated mild colonic inflammation with SBO and leukocytosis. Dr. Daphine Deutscher of surgery was consulted with recommendations for placement of an NG tube and conservative management.  Assessment/Plan:   Principal Problem: SBO (small bowel obstruction) / diverticulitis with nausea, vomiting and abdominal pain and leukocytosis  Being treated for empiric diverticulitis with Cipro and Flagyl given colonic inflammation seen on CT scan and leukocytosis.  Continue NG gastric decompression given findings consistent with possible small bowel obstruction.  Followup abdominal films.  Continue IV fluids, anti-emetics and analgesics as needed. Continue Pepcid. Active Problems: Obstructive sleep apnea  Continue nocturnal CPAP. Tobacco use disorder  Counseled. Declined nicotine patch. Essential hypertension, benign  Oral medications on hold. Hydralazine as needed for elevated blood pressure ordered. Hyperlipidemia  Statins currently on hold. Resume unable to take medications by mouth. DVT Prophylaxis  Continue heparin.  Code Status: Full. Family Communication: Updated at bedside. Disposition Plan: Home when stable.   IV Access:    Peripheral IV   Procedures:    None.   Medical Consultants:    None.   Other Consultants:    None.   Anti-Infectives:    Cipro 08/26/13--->  Flagyl 08/26/13--->  Subjective:    Diane Giles denies nausea, vomiting.  No NG drainage in canister.  Some flatus, no BMs.  No abdominal pain.  Objective:    Filed Vitals:   08/26/13 1840  08/26/13 2300 08/26/13 2330 08/27/13 0527  BP: 142/57 136/55  134/51  Pulse: 64 61  59  Temp: 98.3 F (36.8 C) 97.7 F (36.5 C)  98.1 F (36.7 C)  TempSrc: Oral Oral  Oral  Resp: 17 20  18   Height:  4\' 11"  (1.499 m) 4\' 11"  (1.499 m)   Weight:  85.231 kg (187 lb 14.4 oz) 85.231 kg (187 lb 14.4 oz)   SpO2: 96% 94%  97%    Intake/Output Summary (Last 24 hours) at 08/27/13 0836 Last data filed at 08/27/13 0400  Gross per 24 hour  Intake   2644 ml  Output      1 ml  Net   2643 ml    Exam: Gen:  NAD Cardiovascular:  RRR, No M/R/G Respiratory:  Lungs CTAB Gastrointestinal:  Abdomen soft, NT/ND, + BS Extremities:  No C/E/C   Data Reviewed:    Labs: Basic Metabolic Panel:  Recent Labs Lab 08/26/13 1925 08/27/13 0500  NA 139 139  K 3.5* 3.9  CL 95* 100  CO2 30 29  GLUCOSE 137* 128*  BUN 19 13  CREATININE 0.65 0.73  CALCIUM 9.6 8.3*   GFR Estimated Creatinine Clearance: 70.8 ml/min (by C-G formula based on Cr of 0.73). Liver Function Tests:  Recent Labs Lab 08/26/13 1925  AST 11  ALT 13  ALKPHOS 99  BILITOT 0.3  PROT 7.2  ALBUMIN 3.8    Recent Labs Lab 08/26/13 1925  LIPASE 16    CBC:  Recent Labs Lab 08/26/13 1925 08/27/13 0500  WBC 20.8* 18.0*  NEUTROABS 16.7*  --   HGB 15.3* 13.9  HCT 44.0 41.1  MCV 90.0 91.3  PLT 325 324   CBG:  Recent Labs Lab 08/27/13 0745  GLUCAP 208*   Sepsis Labs:  Recent Labs Lab 08/26/13 1925 08/27/13 0500  WBC 20.8* 18.0*   Microbiology No results found for this or any previous visit (from the past 240 hour(s)).   Radiographs/Studies:   Ct Abdomen Pelvis W Contrast  08/26/2013   CLINICAL DATA:  Low abdominal and back pain.  EXAM: CT ABDOMEN AND PELVIS WITH CONTRAST  TECHNIQUE: Multidetector CT imaging of the abdomen and pelvis was performed using the standard protocol following bolus administration of intravenous contrast.  CONTRAST:  50mL OMNIPAQUE IOHEXOL 300 MG/ML SOLN, OMNIPAQUE  IOHEXOL 300 MG/ML SOLN  COMPARISON:  05/03/2002 by report  FINDINGS: Minimal dependent atelectasis posteriorly in the lung bases. Unremarkable liver, gallbladder, spleen, adrenal glands, kidneys, pancreas. Patchy aortic plaque without stenosis or aneurysm. Small duodenal diverticulum. Stomach is physiologically distended by ingested material.  There are multiple distended mid small bowel loops in the mid abdomen with poor progression of the oral contrast material. Transition point is anteriorly in the right lower abdomen, without any discrete mass or other process. There is a mild regional mesenteric edema. The distal most loops of small bowel and colon are nondilated.  There are few scattered distal descending colon diverticula. There is a small amount of pelvic ascites. Urinary bladder is incompletely distended show normal appendix noted. Uterus and adnexal regions unremarkable. No free air. No adenopathy localized. No hydronephrosis on delayed scans. Regional bones unremarkable.  IMPRESSION: 1. Mid/distal small bowel obstruction, transition point right lower quadrant without evident etiology, suggesting adhesions.   Electronically Signed   By: Oley Balm M.D.   On: 08/26/2013 21:45   US Abdomen Limited  08/26/2013   CLINICAL DATA:  Right upper quadrant pain  EXAM: US ABDOMEN LIMITED - RIGHT UPPER QUADRANT  COMPARISON:  None.  FINDINGS: Gallbladder:  No wall thickening or pericholecystic fluid. A 1.0 cm gallstone is present at the neck. Comet tail artifact from the gallbladder wall is noted. Positive Murphy sign.  Common bile duct:  Diameter: 2 mm in caliber.  Liver:  Heterogeneous liver without definite focal mass.  IMPRESSION: Cholelithiasis.  Comet tail artifact from the gallbladder wall compatible with adenomyomatosis.  Heterogeneous liver compatible with diffuse hepatic parenchymal disease versus diffuse hepatic steatosis.  Positive Murphy sign. Correlate clinically as the need for nuclear medicine  imaging to evaluate for acute cholecystitis.   Electronically Signed   By: Maryclare Bean M.D.   On: 08/26/2013 19:53    Medications:   . ciprofloxacin  400 mg Intravenous Q12H  . famotidine (PEPCID) IV  40 mg Intravenous QHS  . heparin  5,000 Units Subcutaneous 3 times per day  . metronidazole  500 mg Intravenous Q8H   Continuous Infusions: . 0.9 % NaCl with KCl 40 mEq / L 75 mL/hr at 08/27/13 0008    Time spent: 35 minutes with > 50% of time discussing current diagnostic test results, clinical impression and plan of care.    LOS: 1 day   Maryruth Bun Rama  [facility] Pager [phone]. If unable to reach me by pager, please call my cell phone at [phone].  *Please refer to [email or web address], password [email or web address] to get updated schedule on who will round on this patient, as hospitalists switch teams weekly. If 7PM-7AM, please contact night-coverage at [email or web address], password [email or web address] for any overnight needs.  08/27/2013, 8:36 AM    **Disclaimer: This note was dictated with voice recognition software. Similar sounding words can inadvertently be transcribed and this note  may contain transcription errors which may not have been corrected upon publication of note.**

## 2013-08-28 DIAGNOSIS — F17200 Nicotine dependence, unspecified, uncomplicated: Secondary | ICD-10-CM

## 2013-08-28 LAB — CBC
HCT: 36.7 % (ref 36.0–46.0)
Hemoglobin: 12.4 g/dL (ref 12.0–15.0)
MCH: 31.6 pg (ref 26.0–34.0)
MCHC: 33.8 g/dL (ref 30.0–36.0)
MCV: 93.4 fL (ref 78.0–100.0)
PLATELETS: 276 10*3/uL (ref 150–400)
RBC: 3.93 MIL/uL (ref 3.87–5.11)
RDW: 13.4 % (ref 11.5–15.5)
WBC: 10.3 10*3/uL (ref 4.0–10.5)

## 2013-08-28 LAB — BASIC METABOLIC PANEL
BUN: 9 mg/dL (ref 6–23)
CHLORIDE: 101 meq/L (ref 96–112)
CO2: 25 mEq/L (ref 19–32)
CREATININE: 0.58 mg/dL (ref 0.50–1.10)
Calcium: 8.3 mg/dL — ABNORMAL LOW (ref 8.4–10.5)
GFR calc non Af Amer: >90 mL/min (ref >90)
Glucose, Bld: 86 mg/dL (ref 70–99)
Potassium: 4.1 mEq/L (ref 3.7–5.3)
Sodium: 136 mEq/L — ABNORMAL LOW (ref 137–147)

## 2013-08-28 MED ORDER — CIPROFLOXACIN HCL 500 MG PO TABS: 500.0000 mg | ORAL_TABLET | Freq: Two times a day (BID) | ORAL | Status: DC

## 2013-08-28 MED ORDER — METRONIDAZOLE 500 MG PO TABS: 500.0000 mg | ORAL_TABLET | Freq: Three times a day (TID) | ORAL | Status: DC

## 2013-08-28 NOTE — Discharge Summary (Signed)
Physician Discharge Summary  Diane Giles UYQ:[id number] DOB: [birth year] DOA: 08/26/2013  PCP: Norberto Sorenson, MD  Admit date: 08/26/2013 Discharge date: 08/28/2013   Recommendations for Outpatient Follow-Up:   1. F/U with PCP for non-resolution of symptoms.   Discharge Diagnosis:   Principal Problem:    Diverticulitis with nausea, vomiting, abdominal pain and leukocytosis Active Problems:    Obstructive sleep apnea    Tobacco use disorder    Essential hypertension, benign    Hyperlipidemia    Leukocytosis    Abdominal pain  Discharge Condition: Improved.  Diet recommendation: Low sodium, heart healthy.     History of Present Illness:   Diane Giles is an 59 y.o. female with a PMH of hypertension, hyperlipidemia, diverticulosis/diverticulitis, OSA, and remote history of repair of AAA who was admitted 08/27/13 with abdominal pain, nausea and vomiting. Upon evaluation in the ED, CT scan of the abdomen demonstrated mild colonic inflammation with SBO and leukocytosis. Dr. Daphine Deutscher of surgery was consulted with recommendations for placement of an NG tube and conservative management.  Hospital Course by Problem:   Principal Problem:  SBO (small bowel obstruction) / diverticulitis with nausea, vomiting and abdominal pain and leukocytosis  Treated for empiric diverticulitis with Cipro and Flagyl given colonic inflammation seen on CT scan and leukocytosis.  Initially placed on bowel rest/NG decompression. Followup abdominal films 08/27/13 showed a normal bowel gas pattern, so the NG tube was discontinued and her diet was advanced.  Tolerated advancement of diet with resolution of symptoms, D/C home on 7 day course of Cipro/Flagyl. Active Problems:  Obstructive sleep apnea  Continue nocturnal CPAP. Tobacco use disorder  Counseled. Declined nicotine patch. Essential hypertension, benign  Oral medications on hold. Resume at discharge. Hyperlipidemia  Statins currently on  hold. Resume at discharge.  Procedures:    None.   Medical Consultants:    Dr. Almond Lint, Surgery.   Discharge Exam:   Filed Vitals:   08/28/13 0541  BP: 123/68  Pulse: 58  Temp: 97.4 F (36.3 C)  Resp: 20   Filed Vitals:   08/27/13 0527 08/27/13 1400 08/27/13 2130 08/28/13 0541  BP: 134/51 119/72 136/53 123/68  Pulse: 59 55 58 58  Temp: 98.1 F (36.7 C) 98 F (36.7 C) 98 F (36.7 C) 97.4 F (36.3 C)  TempSrc: Oral Oral Oral Oral  Resp: 18 16 18 20   Height:      Weight:      SpO2: 97% 95% 96% 97%    Gen:  NAD Cardiovascular:  RRR, No M/R/G Respiratory: Lungs CTAB Gastrointestinal: Abdomen soft, NT/ND with normal active bowel sounds. Extremities: No C/E/C    Discharge Instructions:   Discharge Orders   Future Orders Complete By Expires   Call MD for:  persistant nausea and vomiting  As directed    Call MD for:  severe uncontrolled pain  As directed    Call MD for:  temperature >100.4  As directed    Diet - low sodium heart healthy  As directed    Discharge instructions  As directed    Increase activity slowly  As directed        Medication List         atenolol-chlorthalidone 50-25 MG per tablet  Commonly known as:  TENORETIC  Take 1 tablet by mouth daily. PATIENT NEEDS OFFICE VISIT FOR ADDITIONAL REFILLS     ciprofloxacin 500 MG tablet  Commonly known as:  CIPRO  Take 1 tablet (500 mg total) by mouth 2 (  two) times daily.     diclofenac 75 MG EC tablet  Commonly known as:  VOLTAREN  Take 75 mg by mouth 2 (two) times daily.     metroNIDAZOLE 500 MG tablet  Commonly known as:  FLAGYL  Take 1 tablet (500 mg total) by mouth 3 (three) times daily.     pravastatin 40 MG tablet  Commonly known as:  PRAVACHOL  Take 1 tablet (40 mg total) by mouth daily. PATIENT NEEDS OFFICE VISIT FOR ADDITIONAL REFILLS          The results of significant diagnostics from this hospitalization (including imaging, microbiology, ancillary and laboratory)  are listed below for reference.     Significant Diagnostic Studies:   Radiographs: Abd 1 View (kub)  08/27/2013   CLINICAL DATA:  Followup small bowel obstruction  EXAM: ABDOMEN - 1 VIEW  COMPARISON:  CT, 08/26/2013  FINDINGS: There is mild small bowel dilation centrally, but overall improvement compared to the previous day's CT. Air is seen in the colon and rectum.  Vascular clips seen in the central abdomen are stable. Soft tissues are otherwise unremarkable.  IMPRESSION: Improved small bowel obstruction.   Electronically Signed   By: Amie Portland M.D.   On: 08/27/2013 08:46   Ct Abdomen Pelvis W Contrast  08/26/2013   CLINICAL DATA:  Low abdominal and back pain.  EXAM: CT ABDOMEN AND PELVIS WITH CONTRAST  TECHNIQUE: Multidetector CT imaging of the abdomen and pelvis was performed using the standard protocol following bolus administration of intravenous contrast.  CONTRAST:  50mL OMNIPAQUE IOHEXOL 300 MG/ML SOLN, OMNIPAQUE IOHEXOL 300 MG/ML SOLN  COMPARISON:  05/03/2002 by report  FINDINGS: Minimal dependent atelectasis posteriorly in the lung bases. Unremarkable liver, gallbladder, spleen, adrenal glands, kidneys, pancreas. Patchy aortic plaque without stenosis or aneurysm. Small duodenal diverticulum. Stomach is physiologically distended by ingested material.  There are multiple distended mid small bowel loops in the mid abdomen with poor progression of the oral contrast material. Transition point is anteriorly in the right lower abdomen, without any discrete mass or other process. There is a mild regional mesenteric edema. The distal most loops of small bowel and colon are nondilated.  There are few scattered distal descending colon diverticula. There is a small amount of pelvic ascites. Urinary bladder is incompletely distended show normal appendix noted. Uterus and adnexal regions unremarkable. No free air. No adenopathy localized. No hydronephrosis on delayed scans. Regional bones  unremarkable.  IMPRESSION: 1. Mid/distal small bowel obstruction, transition point right lower quadrant without evident etiology, suggesting adhesions.   Electronically Signed   By: Oley Balm M.D.   On: 08/26/2013 21:45   US Abdomen Limited  08/26/2013   CLINICAL DATA:  Right upper quadrant pain  EXAM: US ABDOMEN LIMITED - RIGHT UPPER QUADRANT  COMPARISON:  None.  FINDINGS: Gallbladder:  No wall thickening or pericholecystic fluid. A 1.0 cm gallstone is present at the neck. Comet tail artifact from the gallbladder wall is noted. Positive Murphy sign.  Common bile duct:  Diameter: 2 mm in caliber.  Liver:  Heterogeneous liver without definite focal mass.  IMPRESSION: Cholelithiasis.  Comet tail artifact from the gallbladder wall compatible with adenomyomatosis.  Heterogeneous liver compatible with diffuse hepatic parenchymal disease versus diffuse hepatic steatosis.  Positive Murphy sign. Correlate clinically as the need for nuclear medicine imaging to evaluate for acute cholecystitis.   Electronically Signed   By: Maryclare Bean M.D.   On: 08/26/2013 19:53    Labs:  Basic Metabolic  Panel:  Recent Labs Lab 08/26/13 1925 08/27/13 0500 08/28/13 0908  NA 139 139 136*  K 3.5* 3.9 4.1  CL 95* 100 101  CO2 30 29 25   GLUCOSE 137* 128* 86  BUN 19 13 9   CREATININE 0.65 0.73 0.58  CALCIUM 9.6 8.3* 8.3*   GFR Estimated Creatinine Clearance: 70.8 ml/min (by C-G formula based on Cr of 0.58). Liver Function Tests:  Recent Labs Lab 08/26/13 1925  AST 11  ALT 13  ALKPHOS 99  BILITOT 0.3  PROT 7.2  ALBUMIN 3.8    Recent Labs Lab 08/26/13 1925  LIPASE 16   CBC:  Recent Labs Lab 08/26/13 1925 08/27/13 0500 08/28/13 0908  WBC 20.8* 18.0* 10.3  NEUTROABS 16.7*  --   --   HGB 15.3* 13.9 12.4  HCT 44.0 41.1 36.7  MCV 90.0 91.3 93.4  PLT 325 324 276   CBG:  Recent Labs Lab 08/27/13 0745  GLUCAP 208*   Microbiology No results found for this or any previous visit (from the  past 240 hour(s)).  Time coordinating discharge: 35 minutes.  SignedMaryruth Bun [place]  Pager [phone] [facility] 08/28/2013, 2:26 PM

## 2013-08-28 NOTE — Discharge Instructions (Signed)
Diverticulitis  Small pockets or "bubbles" can develop in the wall of the intestine. Diverticulitis is when those pockets become infected and inflamed. This causes stomach pain (usually on the left side).  HOME CARE   Take all medicine as told by your doctor.   Try a clear liquid diet (broth, tea, or water) for as long as told by your doctor.   Keep all follow-up visits with your doctor.   You may be put on a low-fiber diet once you start feeling better. Here are foods that have low-fiber:   White breads, cereals, rice, and pasta.   Cooked fruits and vegetables or soft fresh fruits and vegetables without the skin.   Ground or well-cooked tender beef, ham, veal, lamb, pork, or poultry.   Eggs and seafood.   After you are doing well on the low-fiber diet, you may be put on a high-fiber diet. Here are ways to increase your fiber:   Choose whole-grain breads, cereals, pasta, and brown rice.   Choose fruits and vegetables with skin on. Do not overcook the vegetables.   Choose nuts, seeds, legumes, dried peas, beans, and lentils.   Look for food products that have more than 3 grams of fiber per serving on the food label.  GET HELP RIGHT AWAY IF:   Your pain does not get better or gets worse.   You have trouble eating food.   You are not pooping (having bowel movements) like normal.   You have a temperature by mouth above 102 F (38.9 C), not controlled by medicine.   You keep throwing up (vomiting).   You have bloody or black, tarry poop (stools).   You are getting worse and not better.  MAKE SURE YOU:    Understand these instructions.   Will watch your condition.   Will get help right away if you are not doing well or get worse.  Document Released: 10/10/2007 Document Revised: 07/16/2011 Document Reviewed: 03/14/2009  [employer] Patient Information 2014 [employer].

## 2013-08-28 NOTE — Progress Notes (Signed)
Pt discharged home; discharge instructions explained to patient; patient verbalized understanding discharge instructions.

## 2014-02-19 ENCOUNTER — Other Ambulatory Visit: Payer: Self-pay

## 2014-03-01 ENCOUNTER — Ambulatory Visit (INDEPENDENT_AMBULATORY_CARE_PROVIDER_SITE_OTHER): Payer: 59 | Admitting: Family Medicine

## 2014-03-01 VITALS — BP 138/92 | HR 90 | Temp 98.6°F | Resp 16 | Ht 59.0 in | Wt 194.4 lb

## 2014-03-01 DIAGNOSIS — G44209 Tension-type headache, unspecified, not intractable: Secondary | ICD-10-CM

## 2014-03-01 DIAGNOSIS — I10 Essential (primary) hypertension: Secondary | ICD-10-CM

## 2014-03-01 DIAGNOSIS — E785 Hyperlipidemia, unspecified: Secondary | ICD-10-CM

## 2014-03-01 DIAGNOSIS — Z720 Tobacco use: Secondary | ICD-10-CM

## 2014-03-01 DIAGNOSIS — Z2821 Immunization not carried out because of patient refusal: Secondary | ICD-10-CM

## 2014-03-01 DIAGNOSIS — Z1329 Encounter for screening for other suspected endocrine disorder: Secondary | ICD-10-CM

## 2014-03-01 LAB — COMPLETE METABOLIC PANEL WITHOUT GFR
Albumin: 4.3 g/dL (ref 3.5–5.2)
Potassium: 4.4 meq/L (ref 3.5–5.3)
Total Bilirubin: 0.4 mg/dL (ref 0.2–1.2)

## 2014-03-01 LAB — LIPID PANEL
Cholesterol: 313 mg/dL — ABNORMAL HIGH (ref 0–200)
HDL: 41 mg/dL (ref >39)
LDL Cholesterol: 233 mg/dL — ABNORMAL HIGH (ref 0–99)
Total CHOL/HDL Ratio: 7.6 ratio
Triglycerides: 197 mg/dL — ABNORMAL HIGH (ref <150)
VLDL: 39 mg/dL (ref 0–40)

## 2014-03-01 LAB — POCT CBC
Granulocyte percent: 71.8 % (ref 37–80)
HCT, POC: 45.5 % (ref 37.7–47.9)
Hemoglobin: 14.7 g/dL (ref 12.2–16.2)
Lymph, poc: 2.7 (ref 0.6–3.4)
MCH, POC: 29.9 pg (ref 27–31.2)
MCHC: 32.2 g/dL (ref 31.8–35.4)
MCV: 92.7 fL (ref 80–97)
MID (cbc): 0.7 (ref 0–0.9)
MPV: 7.2 fL (ref 0–99.8)
POC Granulocyte: 8.5 — AB (ref 2–6.9)
POC LYMPH PERCENT: 22.3 %L (ref 10–50)
POC MID %: 5.9 %M (ref 0–12)
Platelet Count, POC: 347 10*3/uL (ref 142–424)
RBC: 4.91 M/uL (ref 4.04–5.48)
RDW, POC: 13.6 %
WBC: 11.9 10*3/uL — AB (ref 4.6–10.2)

## 2014-03-01 LAB — COMPLETE METABOLIC PANEL WITH GFR
ALT: 14 U/L (ref 0–35)
AST: 12 U/L (ref 0–37)
Alkaline Phosphatase: 99 U/L (ref 39–117)
BUN: 12 mg/dL (ref 6–23)
CO2: 24 mEq/L (ref 19–32)
Calcium: 9.5 mg/dL (ref 8.4–10.5)
Chloride: 104 mEq/L (ref 96–112)
Creat: 0.62 mg/dL (ref 0.50–1.10)
GFR, Est African American: >89 mL/min
GFR, Est Non African American: >89 mL/min
Glucose, Bld: 102 mg/dL — ABNORMAL HIGH (ref 70–99)
Sodium: 136 mEq/L (ref 135–145)
Total Protein: 7.4 g/dL (ref 6.0–8.3)

## 2014-03-01 LAB — TSH: TSH: 1.835 u[IU]/mL (ref 0.350–4.500)

## 2014-03-01 MED ORDER — ATENOLOL-CHLORTHALIDONE 50-25 MG PO TABS: 1.0000 | ORAL_TABLET | Freq: Every day | ORAL | Status: DC

## 2014-03-01 MED ORDER — PRAVASTATIN SODIUM 40 MG PO TABS: 40.0000 mg | ORAL_TABLET | Freq: Every day | ORAL | Status: DC

## 2014-03-01 NOTE — Progress Notes (Signed)
Chief Complaint:  Chief Complaint  Patient presents with  . Anxiety    for over a week---feels like her heart beating faster lately--dull headache    HPI: Diane Giles is a 58 y.o. 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 She has frontal stress HA, deneis rashes, confusion, neck rigidity, n/v/abd pain, vision cahnges She is in front of the screen a lot. She feels stressed She is here though primarily to get refills on her medications, HTN and Hyperlipidemia She states she has been feeling a pulsatile feeling/buzzing on the left side of neck for the last few days. She is a smoker She has no other sxs, ie temporal pain, confusion/AMS She has HTN and also high cholesterol , has not been taking meds, she has been out of meds since May, when she is taking her meds it is in the 110-20s She has osteoporosis and has right hip pain, she needs a hip replacement, she uses a cane   Past Medical History  Diagnosis Date  . Osteoporosis   . Sleep apnea   . Diverticulitis   . Hypertension    Past Surgical History  Procedure Laterality Date  . Eye surgery  lasik  . Lower aortic bypass      per patient  . Elbow surgery Left approx 2010    tendon repair by Dr. Veverly Fells   History   Social History  . Marital Status: Married    Spouse Name: N/A    Number of Children: N/A  . Years of Education: N/A   Occupational History  . Art gallery manager    Social History Main Topics  . Smoking status: Current Every Day Smoker -- 0.50 packs/day for 40 years    Types: Cigarettes  . Smokeless tobacco: Never Used  . Alcohol Use: No  . Drug Use: No  . Sexual Activity: Yes    Partners: Male   Other Topics Concern  . None   Social History Narrative   Married. Patient does not exercise. She has a Gaffer.    History reviewed. No pertinent family history. No Known Allergies Prior to Admission medications   Medication Sig Start Date End Date Taking? Authorizing Provider  atenolol-chlorthalidone (TENORETIC) 50-25 MG per tablet Take 1 tablet by mouth daily. PATIENT NEEDS OFFICE VISIT FOR ADDITIONAL REFILLS    Theda Sers, PA-C  ciprofloxacin (CIPRO) 500 MG tablet Take 1 tablet (500 mg total) by mouth 2 (two) times daily. 08/28/13   Venetia Maxon Rama, MD  diclofenac (VOLTAREN) 75 MG EC tablet Take 75 mg by mouth 2 (two) times daily.    Historical Provider, MD  metroNIDAZOLE (FLAGYL) 500 MG tablet Take 1 tablet (500 mg total) by mouth 3 (three) times daily. 08/28/13   Venetia Maxon Rama, MD  pravastatin (PRAVACHOL) 40 MG tablet Take 1 tablet (40 mg total) by mouth daily. PATIENT NEEDS OFFICE VISIT FOR ADDITIONAL REFILLS    Theda Sers, PA-C     ROS: The patient denies fevers, chills, night sweats, unintentional weight loss, chest pain, palpitations, wheezing, dyspnea on exertion, nausea, vomiting, abdominal pain, dysuria, hematuria, melena, numbness, weakness, or tingling.   All other systems have been reviewed and were otherwise negative with the exception of those mentioned in the  HPI and as above.    PHYSICAL EXAM: Filed Vitals:   03/01/14 0910  BP: 138/92  Pulse: 90  Temp: 98.6 F (37 C)  Resp: 16   Filed Vitals:   03/01/14 0910  Height: 4\' 11"  (1.499 m)  Weight: 194 lb 6.4 oz (88.179 kg)   Body mass index is 39.24 kg/(m^2).  General: Alert, no acute distress HEENT:  Normocephalic, atraumatic, oropharynx patent. EOMI, PERRLA, fundo exam normal Cardiovascular:  Regular rate and rhythm, no rubs murmurs or gallops.  No appreciable Carotid bruits, radial pulse intact. No pedal edema.  Respiratory: Clear to auscultation bilaterally.  No wheezes, rales, or rhonchi.  No cyanosis, no use of accessory musculature GI: No organomegaly, abdomen is soft and non-tender, positive bowel sounds.   No masses. Skin: No rashes. Neurologic: Facial musculature symmetric. Psychiatric: Patient is appropriate throughout our interaction. Lymphatic: No cervical lymphadenopathy Musculoskeletal: Gait intact with cane  UE and Baylon Santelli strength NL    LABS: Results for orders placed in visit on 03/01/14  TSH      Result Value Ref Range   TSH 1.835  0.350 - 4.500 uIU/mL  LIPID PANEL      Result Value Ref Range   Cholesterol 313 (*) 0 - 200 mg/dL   Triglycerides 197 (*) <150 mg/dL   HDL 41  >39 mg/dL   Total CHOL/HDL Ratio 7.6     VLDL 39  0 - 40 mg/dL   LDL Cholesterol 233 (*) 0 - 99 mg/dL  COMPLETE METABOLIC PANEL WITH GFR      Result Value Ref Range   Sodium 136  135 - 145 mEq/L   Potassium 4.4  3.5 - 5.3 mEq/L   Chloride 104  96 - 112 mEq/L   CO2 24  19 - 32 mEq/L   Glucose, Bld 102 (*) 70 - 99 mg/dL   BUN 12  6 - 23 mg/dL   Creat 0.62  0.50 - 1.10 mg/dL   Total Bilirubin 0.4  0.2 - 1.2 mg/dL   Alkaline Phosphatase 99  39 - 117 U/L   AST 12  0 - 37 U/L   ALT 14  0 - 35 U/L   Total Protein 7.4  6.0 - 8.3 g/dL   Albumin 4.3  3.5 - 5.2 g/dL   Calcium 9.5  8.4 - 10.5 mg/dL   GFR, Est African American >89     GFR, Est Non African American >89    POCT CBC      Result Value Ref Range   WBC 11.9 (*) 4.6 - 10.2 K/uL   Lymph, poc 2.7  0.6 - 3.4   POC LYMPH PERCENT 22.3  10 - 50 %L   MID (cbc) 0.7  0 - 0.9   POC MID % 5.9  0 - 12 %M   POC Granulocyte 8.5 (*) 2 - 6.9   Granulocyte percent 71.8  37 - 80 %G   RBC 4.91  4.04 - 5.48 M/uL   Hemoglobin 14.7  12.2 - 16.2 g/dL   HCT, POC 45.5  37.7 - 47.9 %   MCV 92.7  80 - 97 fL   MCH, POC 29.9  27 - 31.2 pg   MCHC 32.2  31.8 - 35.4 g/dL   RDW, POC 13.6     Platelet Count, POC 347  142 - 424 K/uL   MPV 7.2  0 - 99.8 fL     EKG/XRAY:   Primary read interpreted by Dr. Marin Comment at [facility].  ASSESSMENT/PLAN: Encounter Diagnoses  Name Primary?  . Essential hypertension Yes  . Hyperlipidemia   . Tobacco use   . Tension-type headache, not  intractable, unspecified chronicity pattern   . Screening for hypothyroidism   . Influenza vaccination declined     Labs pending Refilled HTN and Statin meds If continues to have pulsatile sentation then will get carotid doppler, hx of smoking and circulation issues in the past?  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 Patient is aware that all medications have potential sideeffects and we are unable to predict every sideeffect or drug-drug interaction that may occur.  Lalonnie Shaffer, [place], DO 03/02/2014 5:14 PM

## 2014-05-14 ENCOUNTER — Encounter: Payer: Self-pay | Admitting: Family Medicine

## 2014-05-14 ENCOUNTER — Ambulatory Visit (INDEPENDENT_AMBULATORY_CARE_PROVIDER_SITE_OTHER): Payer: 59 | Admitting: Family Medicine

## 2014-05-14 VITALS — BP 111/72 | HR 76 | Temp 98.0°F | Resp 16 | Ht 59.5 in | Wt 199.0 lb

## 2014-05-14 DIAGNOSIS — E559 Vitamin D deficiency, unspecified: Secondary | ICD-10-CM

## 2014-05-14 DIAGNOSIS — E785 Hyperlipidemia, unspecified: Secondary | ICD-10-CM

## 2014-05-14 DIAGNOSIS — M8580 Other specified disorders of bone density and structure, unspecified site: Secondary | ICD-10-CM

## 2014-05-14 DIAGNOSIS — R102 Pelvic and perineal pain: Secondary | ICD-10-CM

## 2014-05-14 DIAGNOSIS — M1990 Unspecified osteoarthritis, unspecified site: Secondary | ICD-10-CM

## 2014-05-14 DIAGNOSIS — R609 Edema, unspecified: Secondary | ICD-10-CM

## 2014-05-14 DIAGNOSIS — Z0000 Encounter for general adult medical examination without abnormal findings: Secondary | ICD-10-CM

## 2014-05-14 DIAGNOSIS — B9689 Other specified bacterial agents as the cause of diseases classified elsewhere: Secondary | ICD-10-CM

## 2014-05-14 DIAGNOSIS — F17200 Nicotine dependence, unspecified, uncomplicated: Secondary | ICD-10-CM

## 2014-05-14 DIAGNOSIS — M1610 Unilateral primary osteoarthritis, unspecified hip: Secondary | ICD-10-CM

## 2014-05-14 DIAGNOSIS — I10 Essential (primary) hypertension: Secondary | ICD-10-CM

## 2014-05-14 DIAGNOSIS — N760 Acute vaginitis: Secondary | ICD-10-CM

## 2014-05-14 DIAGNOSIS — Z79899 Other long term (current) drug therapy: Secondary | ICD-10-CM

## 2014-05-14 DIAGNOSIS — B373 Candidiasis of vulva and vagina: Secondary | ICD-10-CM

## 2014-05-14 DIAGNOSIS — A499 Bacterial infection, unspecified: Secondary | ICD-10-CM

## 2014-05-14 DIAGNOSIS — B3731 Acute candidiasis of vulva and vagina: Secondary | ICD-10-CM

## 2014-05-14 DIAGNOSIS — H6123 Impacted cerumen, bilateral: Secondary | ICD-10-CM

## 2014-05-14 DIAGNOSIS — Z720 Tobacco use: Secondary | ICD-10-CM

## 2014-05-14 LAB — POCT URINALYSIS DIPSTICK
BILIRUBIN UA: NEGATIVE
GLUCOSE UA: NEGATIVE
Ketones, UA: NEGATIVE
LEUKOCYTES UA: NEGATIVE
Nitrite, UA: NEGATIVE
Protein, UA: 100
Spec Grav, UA: 1.015
UROBILINOGEN UA: 1
pH, UA: 7.5

## 2014-05-14 LAB — COMPLETE METABOLIC PANEL WITH GFR
ALT: 11 U/L (ref 0–35)
AST: 12 U/L (ref 0–37)
Albumin: 4.1 g/dL (ref 3.5–5.2)
Alkaline Phosphatase: 120 U/L — ABNORMAL HIGH (ref 39–117)
BUN: 10 mg/dL (ref 6–23)
CO2: 29 meq/L (ref 19–32)
Calcium: 9.7 mg/dL (ref 8.4–10.5)
Chloride: 99 mEq/L (ref 96–112)
Creat: 0.59 mg/dL (ref 0.50–1.10)
Glucose, Bld: 97 mg/dL (ref 70–99)
POTASSIUM: 4 meq/L (ref 3.5–5.3)
Sodium: 137 mEq/L (ref 135–145)
Total Bilirubin: 0.5 mg/dL (ref 0.2–1.2)
Total Protein: 7.1 g/dL (ref 6.0–8.3)

## 2014-05-14 LAB — POCT WET PREP WITH KOH
KOH Prep POC: POSITIVE
Trichomonas, UA: NEGATIVE
Yeast Wet Prep HPF POC: POSITIVE

## 2014-05-14 MED ORDER — PRAVASTATIN SODIUM 80 MG PO TABS: 80.0000 mg | ORAL_TABLET | Freq: Every day | ORAL | Status: DC

## 2014-05-14 MED ORDER — NABUMETONE 500 MG PO TABS: 500.0000 mg | ORAL_TABLET | Freq: Two times a day (BID) | ORAL | Status: DC | PRN

## 2014-05-14 MED ORDER — HYDROCORTISONE-ACETIC ACID 1-2 % OT SOLN: 3.0000 [drp] | Freq: Three times a day (TID) | OTIC | Status: DC

## 2014-05-14 MED ORDER — FUROSEMIDE 20 MG PO TABS: 20.0000 mg | ORAL_TABLET | Freq: Every day | ORAL | Status: DC

## 2014-05-14 NOTE — Patient Instructions (Addendum)
Make sure you are getting in at least 1223m of calcium daily (normally in two divided doses - calcium supplements - gummies are great for this) as well as 1000-2000u of vitamin D daily.  You will be due for your next bone scan in August 2014. You should try some compression hose or socks - likely a medium 20-30 mmHg - you can find these on-line or at a pharmacy or medical supply store.  Put them on in the morning and take off at night. Try keeping your feet elevated overnight. Do NOT use any over-the-counter medications without checking with either me or your pharmacist. Start a fish oil or omega-3 supplement daily. THe lasix will decrease the potassium in your blood which will cause muscle aches so make sure you are getting plenty of potassium in your diet.  Potassium Content of Foods Potassium is a mineral found in many foods and drinks. It helps keep fluids and minerals balanced in your body and affects how steadily your heart beats. Potassium also helps control your blood pressure and keep your muscles and nervous system healthy. Certain health conditions and medicines may change the balance of potassium in your body. When this happens, you can help balance your level of potassium through the foods that you do or do not eat. Your health care provider or dietitian may recommend an amount of potassium that you should have each day. The following lists of foods provide the amount of potassium (in parentheses) per serving in each item. HIGH IN POTASSIUM  The following foods and beverages have 200 mg or more of potassium per serving:  Apricots, 2 raw or 5 dry (200 mg).  Artichoke, 1 medium (345 mg).  Avocado, raw,  each (245 mg).  Banana, 1 medium (425 mg).  Beans, lima, or baked beans, canned,  cup (280 mg).  Beans, white, canned,  cup (595 mg).  Beef roast, 3 oz (320 mg).  Beef, ground, 3 oz (270 mg).  Beets, raw or cooked,  cup (260 mg).  Bran muffin, 2 oz (300 mg).  Broccoli,   cup (230 mg).  Brussels sprouts,  cup (250 mg).  Cantaloupe,  cup (215 mg).  Cereal, 100% bran,  cup (200-400 mg).  Cheeseburger, single, fast food, 1 each (225-400 mg).  Chicken, 3 oz (220 mg).  Clams, canned, 3 oz (535 mg).  Crab, 3 oz (225 mg).  Dates, 5 each (270 mg).  Dried beans and peas,  cup (300-475 mg).  Figs, dried, 2 each (260 mg).  Fish: halibut, tuna, cod, snapper, 3 oz (480 mg).  Fish: salmon, haddock, swordfish, perch, 3 oz (300 mg).  Fish, tuna, canned 3 oz (200 mg).  FP[place]fries, fast food, 3 oz (470 mg).  Granola with fruit and nuts,  cup (200 mg).  Grapefruit juice,  cup (200 mg).  Greens, beet,  cup (655 mg).  Honeydew melon,  cup (200 mg).  Kale, raw, 1 cup (300 mg).  Kiwi, 1 medium (240 mg).  Kohlrabi, rutabaga, parsnips,  cup (280 mg).  Lentils,  cup (365 mg).  Mango, 1 each (325 mg).  Milk, chocolate, 1 cup (420 mg).  Milk: nonfat, low-fat, whole, buttermilk, 1 cup (350-380 mg).  Molasses, 1 Tbsp (295 mg).  Mushrooms,  cup (280) mg.  Nectarine, 1 each (275 mg).  Nuts: almonds, peanuts, hazelnuts, BBolivia cashew, mixed, 1 oz (200 mg).  Nuts, pistachios, 1 oz (295 mg).  Orange, 1 each (240 mg).  Orange juice,  cup (235 mg).  Papaya, medium,  fruit (390 mg).  Peanut butter, chunky, 2 Tbsp (240 mg).  Peanut butter, smooth, 2 Tbsp (210 mg).  Pear, 1 medium (200 mg).  Pomegranate, 1 whole (400 mg).  Pomegranate juice,  cup (215 mg).  Pork, 3 oz (350 mg).  Potato chips, salted, 1 oz (465 mg).  Potato, baked with skin, 1 medium (925 mg).  Potatoes, boiled,  cup (255 mg).  Potatoes, mashed,  cup (330 mg).  Prune juice,  cup (370 mg).  Prunes, 5 each (305 mg).  Pudding, chocolate,  cup (230 mg).  Pumpkin, canned,  cup (250 mg).  Raisins, seedless,  cup (270 mg).  Seeds, sunflower or pumpkin, 1 oz (240 mg).  Soy milk, 1 cup (300 mg).  Spinach,  cup (420 mg).  Spinach, canned,   cup (370 mg).  Sweet potato, baked with skin, 1 medium (450 mg).  Swiss chard,  cup (480 mg).  Tomato or vegetable juice,  cup (275 mg).  Tomato sauce or puree,  cup (400-550 mg).  Tomato, raw, 1 medium (290 mg).  Tomatoes, canned,  cup (200-300 mg).  [place], 3 oz (250 mg).  Wheat germ, 1 oz (250 mg).  Winter squash,  cup (250 mg).  Yogurt, plain or fruited, 6 oz (260-435 mg).  Zucchini,  cup (220 mg). MODERATE IN POTASSIUM The following foods and beverages have 50-200 mg of potassium per serving:  Apple, 1 each (150 mg).  Apple juice,  cup (150 mg).  Applesauce,  cup (90 mg).  Apricot nectar,  cup (140 mg).  Asparagus, small spears,  cup or 6 spears (155 mg).  Bagel, cinnamon raisin, 1 each (130 mg).  Bagel, egg or plain, 4 in., 1 each (70 mg).  Beans, green,  cup (90 mg).  Beans, yellow,  cup (190 mg).  Beer, regular, 12 oz (100 mg).  Beets, canned,  cup (125 mg).  Blackberries,  cup (115 mg).  Blueberries,  cup (60 mg).  Bread, whole wheat, 1 slice (70 mg).  Broccoli, raw,  cup (145 mg).  Cabbage,  cup (150 mg).  Carrots, cooked or raw,  cup (180 mg).  Cauliflower, raw,  cup (150 mg).  Celery, raw,  cup (155 mg).  Cereal, bran flakes, cup (120-150 mg).  Cheese, cottage,  cup (110 mg).  Cherries, 10 each (150 mg).  Chocolate, 1 oz bar (165 mg).  Coffee, brewed 6 oz (90 mg).  Corn,  cup or 1 ear (195 mg).  Cucumbers,  cup (80 mg).  Egg, large, 1 each (60 mg).  Eggplant,  cup (60 mg).  Endive, raw, cup (80 mg).  English muffin, 1 each (65 mg).  Fish, orange roughy, 3 oz (150 mg).  Frankfurter, beef or pork, 1 each (75 mg).  Fruit cocktail,  cup (115 mg).  Grape juice,  cup (170 mg).  Grapefruit,  fruit (175 mg).  Grapes,  cup (155 mg).  Greens: kale, turnip, collard,  cup (110-150 mg).  Ice cream or frozen yogurt, chocolate,  cup (175 mg).  Ice cream or frozen yogurt, vanilla,   cup (120-150 mg).  Lemons, limes, 1 each (80 mg).  Lettuce, all types, 1 cup (100 mg).  Mixed vegetables,  cup (150 mg).  Mushrooms, raw,  cup (110 mg).  Nuts: walnuts, pecans, or macadamia, 1 oz (125 mg).  Oatmeal,  cup (80 mg).  Okra,  cup (110 mg).  Onions, raw,  cup (120 mg).  Peach, 1 each (185 mg).  Peaches, canned,  cup (  120 mg).  Pears, canned,  cup (120 mg).  Peas, green, frozen,  cup (90 mg).  Peppers, green,  cup (130 mg).  Peppers, red,  cup (160 mg).  Pineapple juice,  cup (165 mg).  Pineapple, fresh or canned,  cup (100 mg).  Plums, 1 each (105 mg).  Pudding, vanilla,  cup (150 mg).  Raspberries,  cup (90 mg).  Rhubarb,  cup (115 mg).  Rice, wild,  cup (80 mg).  Shrimp, 3 oz (155 mg).  Spinach, raw, 1 cup (170 mg).  Strawberries,  cup (125 mg).  Summer squash  cup (175-200 mg).  Swiss chard, raw, 1 cup (135 mg).  Tangerines, 1 each (140 mg).  Tea, brewed, 6 oz (65 mg).  Turnips,  cup (140 mg).  Watermelon,  cup (85 mg).  Wine, red, table, 5 oz (180 mg).  Wine, white, table, 5 oz (100 mg). LOW IN POTASSIUM The following foods and beverages have less than 50 mg of potassium per serving.  Bread, white, 1 slice (30 mg).  Carbonated beverages, 12 oz (less than 5 mg).  Cheese, 1 oz (20-30 mg).  Cranberries,  cup (45 mg).  Cranberry juice cocktail,  cup (20 mg).  Fats and oils, 1 Tbsp (less than 5 mg).  Hummus, 1 Tbsp (32 mg).  Nectar: papaya, mango, or pear,  cup (35 mg).  Rice, white or brown,  cup (50 mg).  Spaghetti or macaroni,  cup cooked (30 mg).  Tortilla, flour or corn, 1 each (50 mg).  Waffle, 4 in., 1 each (50 mg).  Water chestnuts,  cup (40 mg). Document Released: 12/05/2004 Document Revised: 04/28/2013 Document Reviewed: 03/20/2013 [facility] Patient Information 2015 [place], [place]. This information is not intended to replace advice given to you by your health care provider. Make  sure you discuss any questions you have with your health care provider.   Hypokalemia Hypokalemia means that the amount of potassium in the blood is lower than normal.Potassium is a chemical, called an electrolyte, that helps regulate the amount of fluid in the body. It also stimulates muscle contraction and helps nerves function properly.Most of the body's potassium is inside of cells, and only a very small amount is in the blood. Because the amount in the blood is so small, minor changes can be life-threatening. CAUSES  Antibiotics.  Diarrhea or vomiting.  Using laxatives too much, which can cause diarrhea.  Chronic kidney disease.  Water pills (diuretics).  Eating disorders (bulimia).  Low magnesium level.  Sweating a lot. SIGNS AND SYMPTOMS  Weakness.  Constipation.  Fatigue.  Muscle cramps.  Mental confusion.  Skipped heartbeats or irregular heartbeat (palpitations).  Tingling or numbness. DIAGNOSIS  Your health care provider can diagnose hypokalemia with blood tests. In addition to checking your potassium level, your health care provider may also check other lab tests. TREATMENT Hypokalemia can be treated with potassium supplements taken by mouth or adjustments in your current medicines. If your potassium level is very low, you may need to get potassium through a vein (IV) and be monitored in the hospital. A diet high in potassium is also helpful. Foods high in potassium are:  Nuts, such as peanuts and pistachios.  Seeds, such as sunflower seeds and pumpkin seeds.  Peas, lentils, and lima beans.  Whole grain and bran cereals and breads.  Fresh fruit and vegetables, such as apricots, avocado, bananas, cantaloupe, kiwi, oranges, tomatoes, asparagus, and potatoes.  Orange and tomato juices.  Red meats.  Fruit yogurt. HOME CARE INSTRUCTIONS  Take all medicines as prescribed by your health care provider.  Maintain a healthy diet by including nutritious  food, such as fruits, vegetables, nuts, whole grains, and lean meats.  If you are taking a laxative, be sure to follow the directions on the label. SEEK MEDICAL CARE IF:  Your weakness gets worse.  You feel your heart pounding or racing.  You are vomiting or having diarrhea.  You are diabetic and having trouble keeping your blood glucose in the normal range. SEEK IMMEDIATE MEDICAL CARE IF:  You have chest pain, shortness of breath, or dizziness.  You are vomiting or having diarrhea for more than 2 days.  You faint. MAKE SURE YOU:   Understand these instructions.  Will watch your condition.  Will get help right away if you are not doing well or get worse. Document Released: 04/23/2005 Document Revised: 02/11/2013 Document Reviewed: 10/24/2012 [facility] Patient Information 2015 [place], [place]. This information is not intended to replace advice given to you by your health care provider. Make sure you discuss any questions you have with your health care provider.    Bone Health Our bones do many things. They provide structure, protect organs, anchor muscles, and store calcium. Adequate calcium in your diet and weight-bearing physical activity help build strong bones, improve bone amounts, and may reduce the risk of weakening of bones (osteoporosis) later in life. PEAK BONE MASS By age 61, the average woman has acquired most of her skeletal bone mass. A large decline occurs in older adults which increases the risk of osteoporosis. In women this occurs around the time of menopause. It is important for young girls to reach their peak bone mass in order to maintain bone health throughout life. A person with high bone mass as a young adult will be more likely to have a higher bone mass later in life. Not enough calcium consumption and physical activity early on could result in a failure to achieve optimum bone mass in adulthood. OSTEOPOROSIS Osteoporosis is a disease of the bones. It is  defined as low bone mass with deterioration of bone structure. Osteoporosis leads to an increase risk of fractures with falls. These fractures commonly happen in the wrist, hip, and spine. While men and women of all ages and background can develop osteoporosis, some of the risk factors for osteoporosis are:  Female.  White.  Postmenopausal.  Older adults.  Small in body size.  Eating a diet low in calcium.  Physically inactive.  Smoking.  Use of some medications.  Family history. CALCIUM Calcium is a mineral needed by the body for healthy bones, teeth, and proper function of the heart, muscles, and nerves. The body cannot produce calcium so it must be absorbed through food. Good sources of calcium include:  Dairy products (low fat or nonfat milk, cheese, and yogurt).  Dark green leafy vegetables (bok choy and broccoli).  Calcium fortified foods (orange juice, cereal, bread, soy beverages, and tofu products).  Nuts (almonds). Recommended amounts of calcium vary for individuals. RECOMMENDED CALCIUM INTAKES Age and Amount in mg per day  Children 1 to 3 years / 700 mg  Children 4 to 8 years / 1,000 mg  Children 9 to 13 years / 1,300 mg  Teens 14 to 18 years / 1,300 mg  Adults 19 to 50 years / 1,000 mg  Adult women 51 to 70 years / 1,200 mg  Adults 71 years and older / 1,200 mg  Pregnant and breastfeeding teens / 1,300 mg  Pregnant  and breastfeeding adults / 1,000 mg Vitamin D also plays an important role in healthy bone development. Vitamin D helps in the absorption of calcium. WEIGHT-BEARING PHYSICAL ACTIVITY Regular physical activity has many positive health benefits. Benefits include strong bones. Weight-bearing physical activity early in life is important in reaching peak bone mass. Weight-bearing physical activities cause muscles and bones to work against gravity. Some examples of weight bearing physical activities include:  Walking, jogging, or  running.  [employer].  Jumping rope.  Dancing.  Soccer.  Tennis or Racquetball.  Stair climbing.  Basketball.  Hiking.  Weight lifting.  Aerobic fitness classes. Including weight-bearing physical activity into an exercise plan is a great way to keep bones healthy. Adults: Engage in at least 30 minutes of moderate physical activity on most, preferably all, days of the week. Children: Engage in at least 60 minutes of moderate physical activity on most, preferably all, days of the week. FOR MORE INFORMATION [place] Web designer, Soil scientist for [employer] and Promotion: www.cnpp.usda.[place]: EquipmentWeekly.com.ee Document Released: 07/14/2003 Document Revised: 08/18/2012 Document Reviewed: 10/13/2008 [facility] Patient Information 2015 [place], [place]. This information is not intended to replace advice given to you by your health care provider. Make sure you discuss any questions you have with your health care provider.    Exercise to Stay Healthy Exercise helps you become and stay healthy. EXERCISE IDEAS AND TIPS Choose exercises that:  You enjoy.  Fit into your day. You do not need to exercise really hard to be healthy. You can do exercises at a slow or medium level and stay healthy. You can:  Stretch before and after working out.  Try yoga, Pilates, or tai chi.  Lift weights.  Walk fast, swim, jog, run, climb stairs, bicycle, dance, or rollerskate.  Take aerobic classes. Exercises that burn about 150 calories:  Running 1  miles in 15 minutes.  Playing volleyball for 45 to 60 minutes.  Washing and waxing a car for 45 to 60 minutes.  Playing touch football for 45 minutes.  Walking 1  miles in 35 minutes.  Pushing a stroller 1  miles in 30 minutes.  Playing basketball for 30 minutes.  Raking leaves for 30 minutes.  Bicycling 5 miles in 30 minutes.  Walking 2 miles in 30 minutes.  Dancing for 30  minutes.  Shoveling snow for 15 minutes.  Swimming laps for 20 minutes.  Walking up stairs for 15 minutes.  Bicycling 4 miles in 15 minutes.  Gardening for 30 to 45 minutes.  Jumping rope for 15 minutes.  Washing windows or floors for 45 to 60 minutes. Document Released: 05/26/2010 Document Revised: 07/16/2011 Document Reviewed: 05/26/2010 [facility] Patient Information 2015 [place], [place]. This information is not intended to replace advice given to you by your health care provider. Make sure you discuss any questions you have with your health care provider. '  Health Maintenance Adopting a healthy lifestyle and getting preventive care can go a long way to promote health and wellness. Talk with your health care provider about what schedule of regular examinations is right for you. This is a good chance for you to check in with your provider about disease prevention and staying healthy. In between checkups, there are plenty of things you can do on your own. Experts have done a lot of research about which lifestyle changes and preventive measures are most likely to keep you healthy. Ask your health care provider for more information. WEIGHT AND DIET  Eat a  healthy diet  Be sure to include plenty of vegetables, fruits, low-fat dairy products, and lean protein.  Do not eat a lot of foods high in solid fats, added sugars, or salt.  Get regular exercise. This is one of the most important things you can do for your health.  Most adults should exercise for at least 150 minutes each week. The exercise should increase your heart rate and make you sweat (moderate-intensity exercise).  Most adults should also do strengthening exercises at least twice a week. This is in addition to the moderate-intensity exercise.  Maintain a healthy weight  Body mass index (BMI) is a measurement that can be used to identify possible weight problems. It estimates body fat based on height and weight. Your  health care provider can help determine your BMI and help you achieve or maintain a healthy weight.  For females 48 years of age and older:   A BMI below 18.5 is considered underweight.  A BMI of 18.5 to 24.9 is normal.  A BMI of 25 to 29.9 is considered overweight.  A BMI of 30 and above is considered obese.  Watch levels of cholesterol and blood lipids  You should start having your blood tested for lipids and cholesterol at 61 years of age, then have this test every 5 years.  You may need to have your cholesterol levels checked more often if:  Your lipid or cholesterol levels are high.  You are older than 60 years of age.  You are at high risk for heart disease.  CANCER SCREENING   Lung Cancer  Lung cancer screening is recommended for adults 64-7 years old who are at high risk for lung cancer because of a history of smoking.  A yearly low-dose CT scan of the lungs is recommended for people who:  Currently smoke.  Have quit within the past 15 years.  Have at least a 30-pack-year history of smoking. A pack year is smoking an average of one pack of cigarettes a day for 1 year.  Yearly screening should continue until it has been 15 years since you quit.  Yearly screening should stop if you develop a health problem that would prevent you from having lung cancer treatment.  Breast Cancer  Practice breast self-awareness. This means understanding how your breasts normally appear and feel.  It also means doing regular breast self-exams. Let your health care provider know about any changes, no matter how small.  If you are in your 20s or 30s, you should have a clinical breast exam (CBE) by a health care provider every 1-3 years as part of a regular health exam.  If you are 8 or older, have a CBE every year. Also consider having a breast X-ray (mammogram) every year.  If you have a family history of breast cancer, talk to your health care provider about genetic  screening.  If you are at high risk for breast cancer, talk to your health care provider about having an MRI and a mammogram every year.  Breast cancer gene (BRCA) assessment is recommended for women who have family members with BRCA-related cancers. BRCA-related cancers include:  Breast.  Ovarian.  Tubal.  Peritoneal cancers.  Results of the assessment will determine the need for genetic counseling and BRCA1 and BRCA2 testing. Cervical Cancer Routine pelvic examinations to screen for cervical cancer are no longer recommended for nonpregnant women who are considered low risk for cancer of the pelvic organs (ovaries, uterus, and vagina) and who do not  have symptoms. A pelvic examination may be necessary if you have symptoms including those associated with pelvic infections. Ask your health care provider if a screening pelvic exam is right for you.   The Pap test is the screening test for cervical cancer for women who are considered at risk.  If you had a hysterectomy for a problem that was not cancer or a condition that could lead to cancer, then you no longer need Pap tests.  If you are older than 65 years, and you have had normal Pap tests for the past 10 years, you no longer need to have Pap tests.  If you have had past treatment for cervical cancer or a condition that could lead to cancer, you need Pap tests and screening for cancer for at least 20 years after your treatment.  If you no longer get a Pap test, assess your risk factors if they change (such as having a new sexual partner). This can affect whether you should start being screened again.  Some women have medical problems that increase their chance of getting cervical cancer. If this is the case for you, your health care provider may recommend more frequent screening and Pap tests.  The human papillomavirus (HPV) test is another test that may be used for cervical cancer screening. The HPV test looks for the virus that can  cause cell changes in the cervix. The cells collected during the Pap test can be tested for HPV.  The HPV test can be used to screen women 38 years of age and older. Getting tested for HPV can extend the interval between normal Pap tests from three to five years.  An HPV test also should be used to screen women of any age who have unclear Pap test results.  After 62 years of age, women should have HPV testing as often as Pap tests.  Colorectal Cancer  This type of cancer can be detected and often prevented.  Routine colorectal cancer screening usually begins at 61 years of age and continues through 61 years of age.  Your health care provider may recommend screening at an earlier age if you have risk factors for colon cancer.  Your health care provider may also recommend using home test kits to check for hidden blood in the stool.  A small camera at the end of a tube can be used to examine your colon directly (sigmoidoscopy or colonoscopy). This is done to check for the earliest forms of colorectal cancer.  Routine screening usually begins at age 4.  Direct examination of the colon should be repeated every 5-10 years through 61 years of age. However, you may need to be screened more often if early forms of precancerous polyps or small growths are found. Skin Cancer  Check your skin from head to toe regularly.  Tell your health care provider about any new moles or changes in moles, especially if there is a change in a mole's shape or color.  Also tell your health care provider if you have a mole that is larger than the size of a pencil eraser.  Always use sunscreen. Apply sunscreen liberally and repeatedly throughout the day.  Protect yourself by wearing long sleeves, pants, a wide-brimmed hat, and sunglasses whenever you are outside. HEART DISEASE, DIABETES, AND HIGH BLOOD PRESSURE   Have your blood pressure checked at least every 1-2 years. High blood pressure causes heart  disease and increases the risk of stroke.  If you are between 55 years and  72 years old, ask your health care provider if you should take aspirin to prevent strokes.  Have regular diabetes screenings. This involves taking a blood sample to check your fasting blood sugar level.  If you are at a normal weight and have a low risk for diabetes, have this test once every three years after 61 years of age.  If you are overweight and have a high risk for diabetes, consider being tested at a younger age or more often. PREVENTING INFECTION  Hepatitis B  If you have a higher risk for hepatitis B, you should be screened for this virus. You are considered at high risk for hepatitis B if:  You were born in a country where hepatitis B is common. Ask your health care provider which countries are considered high risk.  Your parents were born in a high-risk country, and you have not been immunized against hepatitis B (hepatitis B vaccine).  You have HIV or AIDS.  You use needles to inject street drugs.  You live with someone who has hepatitis B.  You have had sex with someone who has hepatitis B.  You get hemodialysis treatment.  You take certain medicines for conditions, including cancer, organ transplantation, and autoimmune conditions. Hepatitis C  Blood testing is recommended for:  Everyone born from [birth year] through 1967.  Anyone with known risk factors for hepatitis C. Sexually transmitted infections (STIs)  You should be screened for sexually transmitted infections (STIs) including gonorrhea and chlamydia if:  You are sexually active and are younger than 61 years of age.  You are older than 61 years of age and your health care provider tells you that you are at risk for this type of infection.  Your sexual activity has changed since you were last screened and you are at an increased risk for chlamydia or gonorrhea. Ask your health care provider if you are at risk.  If you do not have  HIV, but are at risk, it may be recommended that you take a prescription medicine daily to prevent HIV infection. This is called pre-exposure prophylaxis (PrEP). You are considered at risk if:  You are sexually active and do not regularly use condoms or know the HIV status of your partner(s).  You take drugs by injection.  You are sexually active with a partner who has HIV. Talk with your health care provider about whether you are at high risk of being infected with HIV. If you choose to begin PrEP, you should first be tested for HIV. You should then be tested every 3 months for as long as you are taking PrEP.  PREGNANCY   If you are premenopausal and you may become pregnant, ask your health care provider about preconception counseling.  If you may become pregnant, take 400 to 800 micrograms (mcg) of folic acid every day.  If you want to prevent pregnancy, talk to your health care provider about birth control (contraception). OSTEOPOROSIS AND MENOPAUSE   Osteoporosis is a disease in which the bones lose minerals and strength with aging. This can result in serious bone fractures. Your risk for osteoporosis can be identified using a bone density scan.  If you are 5 years of age or older, or if you are at risk for osteoporosis and fractures, ask your health care provider if you should be screened.  Ask your health care provider whether you should take a calcium or vitamin D supplement to lower your risk for osteoporosis.  Menopause may have certain physical symptoms  and risks.  Hormone replacement therapy may reduce some of these symptoms and risks. Talk to your health care provider about whether hormone replacement therapy is right for you.  HOME CARE INSTRUCTIONS   Schedule regular health, dental, and eye exams.  Stay current with your immunizations.   Do not use any tobacco products including cigarettes, chewing tobacco, or electronic cigarettes.  If you are pregnant, do not  drink alcohol.  If you are breastfeeding, limit how much and how often you drink alcohol.  Limit alcohol intake to no more than 1 drink per day for nonpregnant women. One drink equals 12 ounces of beer, 5 ounces of wine, or 1 ounces of hard liquor.  Do not use street drugs.  Do not share needles.  Ask your health care provider for help if you need support or information about quitting drugs.  Tell your health care provider if you often feel depressed.  Tell your health care provider if you have ever been abused or do not feel safe at home. Document Released: 11/06/2010 Document Revised: 09/07/2013 Document Reviewed: 03/25/2013 [facility] Patient Information 2015 [place], [place]. This information is not intended to replace advice given to you by your health care provider. Make sure you discuss any questions you have with your health care provider. Cerumen Impaction A cerumen impaction is when the wax in your ear forms a plug. This plug usually causes reduced hearing. Sometimes it also causes an earache or dizziness. Removing a cerumen impaction can be difficult and painful. The wax sticks to the ear canal. The canal is sensitive and bleeds easily. If you try to remove a heavy wax buildup with a cotton tipped swab, you may push it in further. Irrigation with water, suction, and small ear curettes may be used to clear out the wax. If the impaction is fixed to the skin in the ear canal, ear drops may be needed for a few days to loosen the wax. People who build up a lot of wax frequently can use ear wax removal products available in your local drugstore. SEEK MEDICAL CARE IF:  You develop an earache, increased hearing loss, or marked dizziness. Document Released: 05/31/2004 Document Revised: 07/16/2011 Document Reviewed: 07/21/2009 [facility] Patient Information 2015 [place], [place]. This information is not intended to replace advice given to you by your health care provider. Make sure you discuss any  questions you have with your health care provider.

## 2014-05-14 NOTE — Progress Notes (Signed)
Subjective:  This chart was scribed for Norberto Sorenson, MD by Carl Best, Medical Scribe. This patient was seen in Room 25 and the patient's care was started at 1:31 PM.   Patient ID: Diane Giles, female    DOB: December 03, 1955, 59 y.o.   MRN: [id number] Chief Complaint  Patient presents with  . Annual Exam  . Gynecologic Exam    HPI HPI Comments: Diane Giles is a 59 y.o. female who presents to the [facility] for an annual exam.  She was seen three months ago by Dr. Conley Rolls and was doing well at that time.  Her orthopedist is Dr. Vallery Sa and she is scheduled for a hip replacement this year.  She had an MRI done at her last visit where Dr. Vallery Sa diagnosed her with torn cartilage in her hip.  He is in the process of reviewing her records to determine if she is eligible for an arthroscopic surgery rather than a hip replacement.  She had a Dexa scan done in August 2014 which revealed severe osteopenia.  She was very close to developing osteoporosis and elevated FRAX.  However she had improved significantly since 2009 so she was recommended to continue Calcium, Vitamin D, and regular weightbearing exercises with repeat scan in August 2016.    She is also complaining of a faint odor of sebaceous material coming from her left ear.  She is concerned that this may be another cyst or she has a cerumen impaction.  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    She takes Pravastatin 40mg  nightly and Atenolol.  She denies urinary symptoms but states that she has noticed increased swelling to her legs bilaterally.  She is not taking any medications for joint pain.  She does not take Forteo.  She is not taking any calcium or Vitamin D.  She does not use any OTC arthritic pain medications.   She had a colonoscopy in 2014 and she was told to return every 3 years.  She does not have a history of hysterectomy and she does not remember when her last pap smear was.  She does not have a  history of abnormal pap smears.  Her last Tdap was in 2012.  She has not received a pneumonia or flu shot this year.  She has not received a shingles vaccine.    She is still smoking.  She has a history of OSA and uses her CPAP regularly.  She denies SOB when sleeping as an associated symptom.  She sleeps with only one pillow.  She has a history of Vitamin D deficiency as of 2 years ago which was treated with an increase to 55.    Past Medical History  Diagnosis Date  . Osteoporosis   . Sleep apnea   . Diverticulitis   . Hypertension    Past Surgical History  Procedure Laterality Date  . Eye surgery  lasik  . Lower aortic bypass      per patient  . Elbow surgery Left approx 2010    tendon repair by Dr. Ranell Patrick   History reviewed. No pertinent family history. History   Social History  . Marital Status: Married    Spouse Name: N/A    Number of Children: N/A  . Years of Education: N/A   Occupational History  . Tree surgeon    Social History Main Topics  . Smoking status: Current Every Day Smoker -- 0.50 packs/day for  40 years    Types: Cigarettes  . Smokeless tobacco: Never Used  . Alcohol Use: No  . Drug Use: No  . Sexual Activity:    Partners: Male   Other Topics Concern  . Not on file   Social History Narrative   Married. Patient does not exercise. She has a Geographical information systems officer.   No Known Allergies  Review of Systems  Cardiovascular: Positive for leg swelling.  Genitourinary: Negative for dysuria, urgency, hematuria, decreased urine volume, vaginal bleeding, enuresis and difficulty urinating.  Musculoskeletal: Positive for arthralgias and gait problem.    Objective:  BP 111/72 mmHg  Pulse 76  Temp(Src) 98 F (36.7 C)  Resp 16  Ht 4' 11.5" (1.511 m)  Wt 199 lb (90.266 kg)  BMI 39.54 kg/m2  SpO2 96%  Physical Exam  Constitutional: She is oriented to person, place, and time. She appears well-developed and well-nourished.  HENT:  Head: Normocephalic and  atraumatic.  Right Ear: Hearing, tympanic membrane, external ear and ear canal normal.  Left Ear: Hearing, tympanic membrane, external ear and ear canal normal.  Cerumen present in both ears.  Bilateral cerumen impaction failed with [place].  I removed with forceps and curet bilaterally.   Eyes: EOM are normal.  Neck: Normal range of motion. Neck supple. No thyromegaly present.  Cardiovascular: Normal rate, regular rhythm, S1 normal, S2 normal and normal heart sounds.  Exam reveals no gallop and no friction rub.   No murmur heard. Pulmonary/Chest: Effort normal. No respiratory distress. She has decreased breath sounds. She has no wheezes. She has no rhonchi. She has no rales.  Musculoskeletal: Normal range of motion. She exhibits edema (1+ pitting edema bilaterally).  Macular erythema on bilateral inner, upper thighs and perineum.    Lymphadenopathy:    She has no cervical adenopathy.  Neurological: She is alert and oriented to person, place, and time.  Skin: Skin is warm and dry.  Psychiatric: She has a normal mood and affect. Her behavior is normal.  Nursing note and vitals reviewed.    Assessment & Plan:   Routine general medical examination at a health care facility - Plan: Vit D  25 hydroxy (rtn osteoporosis monitoring), COMPLETE METABOLIC PANEL WITH GFR, Pap IG and HPV (high risk) DNA detection, CANCELED: TSH  Hyperlipidemia - Plan: POCT glycosylated hemoglobin (Hb A1C), pravastatin (PRAVACHOL) 80 MG tablet - increase pravastatin from 40 to 80. Start omega-3 supp like fish oil.  Tobacco use disorder -  Encouraged cessation  Pelvic pain in female - Plan: POCT Wet Prep with KOH, POCT urinalysis dipstick, US Pelvis Complete, US Transvaginal Non-OB  Essential hypertension, benign - Plan: POCT glycosylated hemoglobin (Hb A1C)  Encounter for long-term (current) use of medications - Plan: POCT glycosylated hemoglobin (Hb A1C)  Peripheral edema - on chlorthalidone 25 so cont, add in  low dose lasix 20 w/ high K diet. Recheck bmp at f/u. Recommend compression hose/socks during day.  Increase water, decrease salt.  Hip arthritis - followed by Dr. Charlann Boxer - receiving another cortisone injection next mo then reports he is deciding whether she should have arthroscopy for cartilage clean up vs hip replacement. Using cane at all times - ok of needs handicap placard. Pain severely limiting exercise - discuss poss pilates, water therapy, reclining bike at f/u  Morbid obesity  Cerumen impaction, bilateral - removed by curette and alligator forceps by myself bilaterally after lavage failed. Start vosol-hc for pain and drainage from canal pt is complaining of. Reviewed steam/nasal saline treatments  for eustachian tube dysfunction  Bacterial vaginosis - flagyl 500 bid x 7d  Vaginal yeast infection - fluconazole and topical external nystatin powder  Vit D def - start high dose replacement x 6 mos - recheck at f/u  Osteopenia - due for repeat dexa 12/2014. Restart calcium/vit D supp and try weight bearing exercise.  Meds ordered this encounter  Medications  . furosemide (LASIX) 20 MG tablet    Sig: Take 1 tablet (20 mg total) by mouth daily.    Dispense:  30 tablet    Refill:  3  . nabumetone (RELAFEN) 500 MG tablet    Sig: Take 1 tablet (500 mg total) by mouth 2 (two) times daily as needed for mild pain.    Dispense:  60 tablet    Refill:  2  . pravastatin (PRAVACHOL) 80 MG tablet    Sig: Take 1 tablet (80 mg total) by mouth daily.    Dispense:  90 tablet    Refill:  1  . acetic acid-hydrocortisone (VOSOL-HC) otic solution    Sig: Place 3 drops into both ears 3 (three) times daily.    Dispense:  10 mL    Refill:  0  . metroNIDAZOLE (FLAGYL) 500 MG tablet    Sig: Take 1 tablet (500 mg total) by mouth 2 (two) times daily.    Dispense:  14 tablet    Refill:  0  . fluconazole (DIFLUCAN) 150 MG tablet    Sig: Take 1 tablet (150 mg total) by mouth once. Repeat if needed after  3days.    Dispense:  2 tablet    Refill:  0    I personally performed the services described in this documentation, which was scribed in my presence. The recorded information has been reviewed and considered, and addended by me as needed.  Norberto Sorenson, MD MPH   Results for orders placed or performed in visit on 05/14/14  Vit D  25 hydroxy (rtn osteoporosis monitoring)  Result Value Ref Range   Vit D, 25-Hydroxy 17 (L) 30 - 100 ng/mL  COMPLETE METABOLIC PANEL WITH GFR  Result Value Ref Range   Sodium 137 135 - 145 mEq/L   Potassium 4.0 3.5 - 5.3 mEq/L   Chloride 99 96 - 112 mEq/L   CO2 29 19 - 32 mEq/L   Glucose, Bld 97 70 - 99 mg/dL   BUN 10 6 - 23 mg/dL   Creat 1.61 0.96 - 0.45 mg/dL   Total Bilirubin 0.5 0.2 - 1.2 mg/dL   Alkaline Phosphatase 120 (H) 39 - 117 U/L   AST 12 0 - 37 U/L   ALT 11 0 - 35 U/L   Total Protein 7.1 6.0 - 8.3 g/dL   Albumin 4.1 3.5 - 5.2 g/dL   Calcium 9.7 8.4 - 40.9 mg/dL   GFR, Est African American >89 mL/min   GFR, Est Non African American >89 mL/min  POCT Wet Prep with KOH  Result Value Ref Range   Trichomonas, UA Negative    Clue Cells Wet Prep HPF POC 15-25    Epithelial Wet Prep HPF POC 15-25    Yeast Wet Prep HPF POC positive    Bacteria Wet Prep HPF POC 4+    RBC Wet Prep HPF POC 0-3    WBC Wet Prep HPF POC 2-7    KOH Prep POC Positive   POCT urinalysis dipstick  Result Value Ref Range   Color, UA Yellow    Clarity, UA Clear  Glucose, UA neg    Bilirubin, UA neg    Ketones, UA neg    Spec Grav, UA 1.015    Blood, UA mod    pH, UA 7.5    Protein, UA 100    Urobilinogen, UA 1.0    Nitrite, UA neg    Leukocytes, UA Negative

## 2014-05-15 LAB — VITAMIN D 25 HYDROXY (VIT D DEFICIENCY, FRACTURES): Vit D, 25-Hydroxy: 17 ng/mL — ABNORMAL LOW (ref 30–100)

## 2014-05-15 MED ORDER — NYSTATIN 100000 UNIT/GM EX POWD: 5.0000 g | Freq: Every day | CUTANEOUS | Status: DC

## 2014-05-15 MED ORDER — METRONIDAZOLE 500 MG PO TABS: 500.0000 mg | ORAL_TABLET | Freq: Two times a day (BID) | ORAL | Status: DC

## 2014-05-15 MED ORDER — FLUCONAZOLE 150 MG PO TABS: 150.0000 mg | ORAL_TABLET | Freq: Once | ORAL | Status: DC

## 2014-05-15 MED ORDER — VITAMIN D (ERGOCALCIFEROL) 1.25 MG (50000 UNIT) PO CAPS: 50000.0000 [IU] | ORAL_CAPSULE | ORAL | Status: DC

## 2014-05-17 NOTE — Addendum Note (Signed)
Addended by: Constance Goltz on: 05/17/2014 12:10 PM   Modules accepted: Orders

## 2014-05-18 ENCOUNTER — Other Ambulatory Visit (INDEPENDENT_AMBULATORY_CARE_PROVIDER_SITE_OTHER): Payer: 59 | Admitting: Radiology

## 2014-05-18 ENCOUNTER — Encounter: Payer: Self-pay | Admitting: Family Medicine

## 2014-05-18 DIAGNOSIS — E119 Type 2 diabetes mellitus without complications: Secondary | ICD-10-CM | POA: Insufficient documentation

## 2014-05-18 DIAGNOSIS — H6123 Impacted cerumen, bilateral: Secondary | ICD-10-CM

## 2014-05-18 DIAGNOSIS — E785 Hyperlipidemia, unspecified: Secondary | ICD-10-CM

## 2014-05-18 DIAGNOSIS — I10 Essential (primary) hypertension: Secondary | ICD-10-CM

## 2014-05-18 LAB — POCT GLYCOSYLATED HEMOGLOBIN (HGB A1C): Hemoglobin A1C: 6.2

## 2014-05-18 NOTE — Progress Notes (Signed)
Pt here for labs only. 

## 2014-05-19 LAB — PAP IG AND HPV HIGH-RISK: HPV DNA High Risk: NOT DETECTED

## 2014-05-21 ENCOUNTER — Ambulatory Visit
Admission: RE | Admit: 2014-05-21 | Discharge: 2014-05-21 | Disposition: A | Discharge status: Home / Self Care | Payer: 59 | Source: Ambulatory Visit | Attending: Family Medicine | Admitting: Family Medicine

## 2014-05-21 DIAGNOSIS — R102 Pelvic and perineal pain: Secondary | ICD-10-CM

## 2014-05-24 ENCOUNTER — Encounter: Payer: Self-pay | Admitting: Family Medicine

## 2014-05-24 ENCOUNTER — Other Ambulatory Visit: Payer: Self-pay | Admitting: Family Medicine

## 2014-05-24 DIAGNOSIS — N949 Unspecified condition associated with female genital organs and menstrual cycle: Secondary | ICD-10-CM

## 2014-05-24 DIAGNOSIS — R9389 Abnormal findings on diagnostic imaging of other specified body structures: Secondary | ICD-10-CM

## 2014-06-22 ENCOUNTER — Encounter: Payer: Self-pay | Admitting: Family Medicine

## 2014-06-28 ENCOUNTER — Encounter: Payer: Self-pay | Admitting: Family Medicine

## 2014-07-16 NOTE — Patient Instructions (Addendum)
Diane Giles  07/16/2014   Your procedure is scheduled on: Tuesday 07/27/14  Report to [facility] [address]  Entrance and follow signs to               [place] at 07:05 AM.  Call this number if you have problems the morning of surgery [phone]   Remember: Please bring CPAP mask and tubing on day of surgery.   Do not eat food or drink liquids :After Midnight.                               You may not have any metal on your body including hair pins and              piercings  Do not wear jewelry, make-up, lotions, powders or perfumes.             Do not wear nail polish.  Do not shave  48 hours prior to surgery.              Men may shave face and neck.  Do not bring valuables to the hospital. [place].  Contacts, dentures or bridgework may not be worn into surgery.  Leave suitcase in the car. After surgery it may be brought to your room.              Please read over the following fact sheets you were given: MRSA information _____________________________________________________________________            [facility] - Preparing for Surgery Before surgery, you can play an important role.  Because skin is not sterile, your skin needs to be as free of germs as possible.  You can reduce the number of germs on your skin by washing with CHG (chlorahexidine gluconate) soap before surgery.  CHG is an antiseptic cleaner which kills germs and bonds with the skin to continue killing germs even after washing. Please DO NOT use if you have an allergy to CHG or antibacterial soaps.  If your skin becomes reddened/irritated stop using the CHG and inform your nurse when you arrive at Short Stay. Do not shave (including legs and underarms) for at least 48 hours prior to the first CHG shower.  You may shave your face/neck. Please follow these instructions carefully:  1.  Shower with CHG Soap the night before surgery and  the  morning of Surgery.  2.  If you choose to wash your hair, wash your hair first as usual with your  normal  shampoo.  3.  After you shampoo, rinse your hair and body thoroughly to remove the  shampoo.                            4.  Use CHG as you would any other liquid soap.  You can apply chg directly  to the skin and wash                       Gently with a scrungie or clean washcloth.  5.  Apply the CHG Soap to your body ONLY FROM THE NECK DOWN.   Do not use on face/ open  Wound or open sores. Avoid contact with eyes, ears mouth and genitals (private parts).                       Wash face,  Genitals (private parts) with your normal soap.             6.  Wash thoroughly, paying special attention to the area where your surgery  will be performed.  7.  Thoroughly rinse your body with warm water from the neck down.  8.  DO NOT shower/wash with your normal soap after using and rinsing off  the CHG Soap.                9.  Pat yourself dry with a clean towel.            10.  Wear clean pajamas.            11.  Place clean sheets on your bed the night of your first shower and do not  sleep with pets. Day of Surgery : Do not apply any lotions/deodorants the morning of surgery.  Please wear clean clothes to the hospital/surgery center.  FAILURE TO FOLLOW THESE INSTRUCTIONS MAY RESULT IN THE CANCELLATION OF YOUR SURGERY PATIENT SIGNATURE_________________________________  NURSE SIGNATURE__________________________________  ________________________________________________________________________  WHAT IS A BLOOD TRANSFUSION? Blood Transfusion Information  A transfusion is the replacement of blood or some of its parts. Blood is made up of multiple cells which provide different functions.  Red blood cells carry oxygen and are used for blood loss replacement.  White blood cells fight against infection.  Platelets control bleeding.  Plasma helps clot blood.  Other  blood products are available for specialized needs, such as hemophilia or other clotting disorders. BEFORE THE TRANSFUSION  Who gives blood for transfusions?   Healthy volunteers who are fully evaluated to make sure their blood is safe. This is blood bank blood. Transfusion therapy is the safest it has ever been in the practice of medicine. Before blood is taken from a donor, a complete history is taken to make sure that person has no history of diseases nor engages in risky social behavior (examples are intravenous drug use or sexual activity with multiple partners). The donor's travel history is screened to minimize risk of transmitting infections, such as malaria. The donated blood is tested for signs of infectious diseases, such as HIV and hepatitis. The blood is then tested to be sure it is compatible with you in order to minimize the chance of a transfusion reaction. If you or a relative donates blood, this is often done in anticipation of surgery and is not appropriate for emergency situations. It takes many days to process the donated blood. RISKS AND COMPLICATIONS Although transfusion therapy is very safe and saves many lives, the main dangers of transfusion include:  1. Getting an infectious disease. 2. Developing a transfusion reaction. This is an allergic reaction to something in the blood you were given. Every precaution is taken to prevent this. The decision to have a blood transfusion has been considered carefully by your caregiver before blood is given. Blood is not given unless the benefits outweigh the risks. AFTER THE TRANSFUSION  Right after receiving a blood transfusion, you will usually feel much better and more energetic. This is especially true if your red blood cells have gotten low (anemic). The transfusion raises the level of the red blood cells which carry oxygen, and this usually causes an energy increase.  The  nurse administering the transfusion will monitor you carefully  for complications. HOME CARE INSTRUCTIONS  No special instructions are needed after a transfusion. You may find your energy is better. Speak with your caregiver about any limitations on activity for underlying diseases you may have. SEEK MEDICAL CARE IF:   Your condition is not improving after your transfusion.  You develop redness or irritation at the intravenous (IV) site. SEEK IMMEDIATE MEDICAL CARE IF:  Any of the following symptoms occur over the next 12 hours:  Shaking chills.  You have a temperature by mouth above 102 F (38.9 C), not controlled by medicine.  Chest, back, or muscle pain.  People around you feel you are not acting correctly or are confused.  Shortness of breath or difficulty breathing.  Dizziness and fainting.  You get a rash or develop hives.  You have a decrease in urine output.  Your urine turns a dark color or changes to pink, red, or brown. Any of the following symptoms occur over the next 10 days:  You have a temperature by mouth above 102 F (38.9 C), not controlled by medicine.  Shortness of breath.  Weakness after normal activity.  The white part of the eye turns yellow (jaundice).  You have a decrease in the amount of urine or are urinating less often.  Your urine turns a dark color or changes to pink, red, or brown. Document Released: 04/20/2000 Document Revised: 07/16/2011 Document Reviewed: 12/08/2007 ExitCare Patient Information 2014 [place].  _______________________________________________________________________  Incentive Spirometer  An incentive spirometer is a tool that can help keep your lungs clear and active. This tool measures how well you are filling your lungs with each breath. Taking long deep breaths may help reverse or decrease the chance of developing breathing (pulmonary) problems (especially infection) following:  A long period of time when you are unable to move or be active. BEFORE THE PROCEDURE    If the spirometer includes an indicator to show your best effort, your nurse or respiratory therapist will set it to a desired goal.  If possible, sit up straight or lean slightly forward. Try not to slouch.  Hold the incentive spirometer in an upright position. INSTRUCTIONS FOR USE  3. Sit on the edge of your bed if possible, or sit up as far as you can in bed or on a chair. 4. Hold the incentive spirometer in an upright position. 5. Breathe out normally. 6. Place the mouthpiece in your mouth and seal your lips tightly around it. 7. Breathe in slowly and as deeply as possible, raising the piston or the ball toward the top of the column. 8. Hold your breath for 3-5 seconds or for as long as possible. Allow the piston or ball to fall to the bottom of the column. 9. Remove the mouthpiece from your mouth and breathe out normally. 10. Rest for a few seconds and repeat Steps 1 through 7 at least 10 times every 1-2 hours when you are awake. Take your time and take a few normal breaths between deep breaths. 11. The spirometer may include an indicator to show your best effort. Use the indicator as a goal to work toward during each repetition. 12. After each set of 10 deep breaths, practice coughing to be sure your lungs are clear. If you have an incision (the cut made at the time of surgery), support your incision when coughing by placing a pillow or rolled up towels firmly against it. Once you are able to get  out of bed, walk around indoors and cough well. You may stop using the incentive spirometer when instructed by your caregiver.  RISKS AND COMPLICATIONS  Take your time so you do not get dizzy or light-headed.  If you are in pain, you may need to take or ask for pain medication before doing incentive spirometry. It is harder to take a deep breath if you are having pain. AFTER USE  Rest and breathe slowly and easily.  It can be helpful to keep track of a log of your progress. Your caregiver  can provide you with a simple table to help with this. If you are using the spirometer at home, follow these instructions: [place] IF:   You are having difficultly using the spirometer.  You have trouble using the spirometer as often as instructed.  Your pain medication is not giving enough relief while using the spirometer.  You develop fever of 100.5 F (38.1 C) or higher. SEEK IMMEDIATE MEDICAL CARE IF:   You cough up bloody sputum that had not been present before.  You develop fever of 102 F (38.9 C) or greater.  You develop worsening pain at or near the incision site. MAKE SURE YOU:   Understand these instructions.  Will watch your condition.  Will get help right away if you are not doing well or get worse. Document Released: 09/03/2006 Document Revised: 07/16/2011 Document Reviewed: 11/04/2006 [facility] Patient Information 2014 [place], [place].   ________________________________________________________________________

## 2014-07-16 NOTE — Progress Notes (Signed)
Surgery clearance note 05/14/14 Dr. Brigitte Pulse stating to repeat CBC, CMET and EKG prior to surgery. Note on chart.

## 2014-07-19 ENCOUNTER — Encounter (HOSPITAL_COMMUNITY): Payer: Self-pay

## 2014-07-19 ENCOUNTER — Encounter (HOSPITAL_COMMUNITY)
Admission: RE | Admit: 2014-07-19 | Discharge: 2014-07-19 | Disposition: A | Discharge status: Home / Self Care | Payer: 59 | Source: Ambulatory Visit | Attending: Orthopedic Surgery | Admitting: Orthopedic Surgery

## 2014-07-19 DIAGNOSIS — Z01810 Encounter for preprocedural cardiovascular examination: Secondary | ICD-10-CM | POA: Insufficient documentation

## 2014-07-19 DIAGNOSIS — Z01812 Encounter for preprocedural laboratory examination: Secondary | ICD-10-CM | POA: Insufficient documentation

## 2014-07-19 HISTORY — DX: Unspecified osteoarthritis, unspecified site: M19.90

## 2014-07-19 LAB — SURGICAL PCR SCREEN
MRSA, PCR: NEGATIVE
STAPHYLOCOCCUS AUREUS: NEGATIVE

## 2014-07-19 LAB — PROTIME-INR
INR: 0.87 (ref 0.00–1.49)
Prothrombin Time: 12 seconds (ref 11.6–15.2)

## 2014-07-19 LAB — CBC
HCT: 40.7 % (ref 36.0–46.0)
HEMOGLOBIN: 13.7 g/dL (ref 12.0–15.0)
MCH: 31.5 pg (ref 26.0–34.0)
MCHC: 33.7 g/dL (ref 30.0–36.0)
MCV: 93.6 fL (ref 78.0–100.0)
Platelets: 261 10*3/uL (ref 150–400)
RBC: 4.35 MIL/uL (ref 3.87–5.11)
RDW: 13.1 % (ref 11.5–15.5)
WBC: 11.1 10*3/uL — ABNORMAL HIGH (ref 4.0–10.5)

## 2014-07-19 LAB — URINE MICROSCOPIC-ADD ON

## 2014-07-19 LAB — URINALYSIS, ROUTINE W REFLEX MICROSCOPIC
Bilirubin Urine: NEGATIVE
Glucose, UA: NEGATIVE mg/dL
Ketones, ur: NEGATIVE mg/dL
Leukocytes, UA: NEGATIVE
Nitrite: NEGATIVE
PH: 7.5 (ref 5.0–8.0)
Protein, ur: NEGATIVE mg/dL
SPECIFIC GRAVITY, URINE: 1.011 (ref 1.005–1.030)
Urobilinogen, UA: 0.2 mg/dL (ref 0.0–1.0)

## 2014-07-19 LAB — COMPREHENSIVE METABOLIC PANEL
ALT: 19 U/L (ref 0–35)
ANION GAP: 6 (ref 5–15)
AST: 18 U/L (ref 0–37)
Albumin: 3.7 g/dL (ref 3.5–5.2)
Alkaline Phosphatase: 99 U/L (ref 39–117)
BILIRUBIN TOTAL: 0.4 mg/dL (ref 0.3–1.2)
BUN: 15 mg/dL (ref 6–23)
CO2: 31 mmol/L (ref 19–32)
Calcium: 9 mg/dL (ref 8.4–10.5)
Chloride: 102 mmol/L (ref 96–112)
Creatinine, Ser: 0.75 mg/dL (ref 0.50–1.10)
GFR calc non Af Amer: 90 mL/min — ABNORMAL LOW (ref >90)
GLUCOSE: 153 mg/dL — AB (ref 70–99)
POTASSIUM: 3.6 mmol/L (ref 3.5–5.1)
SODIUM: 139 mmol/L (ref 135–145)
Total Protein: 7.1 g/dL (ref 6.0–8.3)

## 2014-07-19 LAB — APTT: aPTT: 29 seconds (ref 24–37)

## 2014-07-22 NOTE — H&P (Signed)
TOTAL HIP ADMISSION H&P  Patient is admitted for right total hip arthroplasty, anterior approach.  Subjective:  Chief Complaint:  Right hip primary OA / pain  HPI: Diane Giles, 59 y.o. female, has a history of pain and functional disability in the right hip(s) due to arthritis and patient has failed non-surgical conservative treatments for greater than 12 weeks to include NSAID's and/or analgesics, corticosteriod injections, use of assistive devices and activity modification.  Onset of symptoms was gradual starting 4+ years ago with gradually worsening course since that time.The patient noted no past surgery on the right hip(s).  Patient currently rates pain in the right hip at 10 out of 10 with activity. Patient has worsening of pain with activity and weight bearing, trendelenberg gait, pain that interfers with activities of daily living, pain with passive range of motion, crepitus and joint swelling. Patient has evidence of periarticular osteophytes and joint space narrowing by imaging studies. This condition presents safety issues increasing the risk of falls.  There is no current active infection.  Risks, benefits and expectations were discussed with the patient.  Risks including but not limited to the risk of anesthesia, blood clots, nerve damage, blood vessel damage, failure of the prosthesis, infection and up to and including death.  Patient understand the risks, benefits and expectations and wishes to proceed with surgery.   PCP: Delman Cheadle, MD  D/C Plans:      Home with HHPT  Post-op Meds:       No Rx given  Tranexamic Acid:      To be given - IV   Decadron:      Is to be given  FYI:     ASA post-op  Norco post-op    Patient Active Problem List   Diagnosis Date Noted  . Prediabetes 05/18/2014  . Diverticulitis 08/26/2013  . Leukocytosis 08/26/2013  . Abdominal pain 08/26/2013  . DDD (degenerative disc disease) 03/18/2013  . Metabolic syndrome 81/82/9937  . Unspecified  vitamin D deficiency 07/29/2012  . Essential hypertension, benign 07/18/2012  . Hyperlipidemia 07/18/2012  . Obstructive sleep apnea 07/04/2012  . Osteoporosis, unspecified 07/04/2012  . Morbid obesity 07/04/2012  . Tobacco use disorder 07/04/2012  . Trochanteric bursitis of right hip 07/04/2012   Past Medical History  Diagnosis Date  . Osteoporosis   . Sleep apnea   . Diverticulitis   . Hypertension   . Arthritis     Past Surgical History  Procedure Laterality Date  . Lower aortic bypass  2000    per patient  . Elbow surgery Left ~2007    tendon repair by Dr. Veverly Fells  . Eye surgery Bilateral 2008    lasik    No prescriptions prior to admission   Allergies  Allergen Reactions  . Codeine Itching    History  Substance Use Topics  . Smoking status: Current Every Day Smoker -- 0.50 packs/day for 40 years    Types: Cigarettes  . Smokeless tobacco: Never Used  . Alcohol Use: Yes     Comment: very rare       Review of Systems  Constitutional: Negative.   HENT: Negative.   Eyes: Negative.   Respiratory: Negative.   Cardiovascular: Negative.   Gastrointestinal: Negative.   Genitourinary: Negative.   Musculoskeletal: Positive for joint pain.  Skin: Negative.   Neurological: Negative.   Endo/Heme/Allergies: Negative.   Psychiatric/Behavioral: Negative.     Objective:  Physical Exam  Constitutional: She is oriented to person, place, and time. She appears  well-developed and well-nourished.  HENT:  Head: Normocephalic and atraumatic.  Eyes: Pupils are equal, round, and reactive to light.  Neck: Neck supple. No JVD present. No tracheal deviation present. No thyromegaly present.  Cardiovascular: Normal rate, regular rhythm, normal heart sounds and intact distal pulses.   Respiratory: Effort normal and breath sounds normal. No stridor. No respiratory distress. She has no wheezes.  GI: Soft. There is no tenderness. There is no guarding.  Musculoskeletal:       Right  hip: She exhibits decreased range of motion, decreased strength, tenderness and bony tenderness. She exhibits no swelling, no deformity and no laceration.  Lymphadenopathy:    She has no cervical adenopathy.  Neurological: She is alert and oriented to person, place, and time.  Skin: Skin is warm and dry.  Psychiatric: She has a normal mood and affect.      Labs:  Estimated body mass index is 39.54 kg/(m^2) as calculated from the following:   Height as of 05/14/14: 4' 11.5" (1.511 m).   Weight as of 05/14/14: 90.266 kg (199 lb).   Imaging Review Plain radiographs demonstrate severe degenerative joint disease of the right hip(s). The bone quality appears to be good for age and reported activity level.  Assessment/Plan:  End stage arthritis, right hip(s)  The patient history, physical examination, clinical judgement of the provider and imaging studies are consistent with end stage degenerative joint disease of the right hip(s) and total hip arthroplasty is deemed medically necessary. The treatment options including medical management, injection therapy, arthroscopy and arthroplasty were discussed at length. The risks and benefits of total hip arthroplasty were presented and reviewed. The risks due to aseptic loosening, infection, stiffness, dislocation/subluxation,  thromboembolic complications and other imponderables were discussed.  The patient acknowledged the explanation, agreed to proceed with the plan and consent was signed. Patient is being admitted for inpatient treatment for surgery, pain control, PT, OT, prophylactic antibiotics, VTE prophylaxis, progressive ambulation and ADL's and discharge planning.The patient is planning to be discharged home with home health services.      West Pugh Kirat Mezquita   PA-C  07/22/2014, 10:02 AM

## 2014-07-27 ENCOUNTER — Inpatient Hospital Stay (HOSPITAL_COMMUNITY): Payer: 59 | Admitting: Anesthesiology

## 2014-07-27 ENCOUNTER — Encounter (HOSPITAL_COMMUNITY): Payer: Self-pay

## 2014-07-27 ENCOUNTER — Inpatient Hospital Stay (HOSPITAL_COMMUNITY): Payer: 59

## 2014-07-27 ENCOUNTER — Inpatient Hospital Stay (HOSPITAL_COMMUNITY)
Admission: RE | Admit: 2014-07-27 | Discharge: 2014-07-28 | DRG: 470 | Disposition: A | Discharge status: Home Health | Payer: 59 | Source: Ambulatory Visit | Attending: Orthopedic Surgery | Admitting: Orthopedic Surgery

## 2014-07-27 ENCOUNTER — Encounter (HOSPITAL_COMMUNITY)
Admission: RE | Disposition: A | Discharge status: Home Health | Payer: Self-pay | Source: Ambulatory Visit | Attending: Orthopedic Surgery

## 2014-07-27 DIAGNOSIS — Z6841 Body Mass Index (BMI) 40.0 and over, adult: Secondary | ICD-10-CM

## 2014-07-27 DIAGNOSIS — F17210 Nicotine dependence, cigarettes, uncomplicated: Secondary | ICD-10-CM | POA: Diagnosis present

## 2014-07-27 DIAGNOSIS — M810 Age-related osteoporosis without current pathological fracture: Secondary | ICD-10-CM | POA: Diagnosis present

## 2014-07-27 DIAGNOSIS — E785 Hyperlipidemia, unspecified: Secondary | ICD-10-CM | POA: Diagnosis present

## 2014-07-27 DIAGNOSIS — G4733 Obstructive sleep apnea (adult) (pediatric): Secondary | ICD-10-CM | POA: Diagnosis present

## 2014-07-27 DIAGNOSIS — I10 Essential (primary) hypertension: Secondary | ICD-10-CM | POA: Diagnosis present

## 2014-07-27 DIAGNOSIS — M1611 Unilateral primary osteoarthritis, right hip: Principal | ICD-10-CM | POA: Diagnosis present

## 2014-07-27 DIAGNOSIS — Z01812 Encounter for preprocedural laboratory examination: Secondary | ICD-10-CM

## 2014-07-27 DIAGNOSIS — Z96649 Presence of unspecified artificial hip joint: Secondary | ICD-10-CM

## 2014-07-27 DIAGNOSIS — M25551 Pain in right hip: Secondary | ICD-10-CM | POA: Diagnosis present

## 2014-07-27 HISTORY — PX: TOTAL HIP ARTHROPLASTY: SHX124

## 2014-07-27 LAB — TYPE AND SCREEN
ABO/RH(D): A NEG
ANTIBODY SCREEN: NEGATIVE

## 2014-07-27 LAB — ABO/RH: ABO/RH(D): A NEG

## 2014-07-27 SURGERY — ARTHROPLASTY, HIP, TOTAL, ANTERIOR APPROACH
Anesthesia: Spinal | Site: Hip | Laterality: Right

## 2014-07-27 MED ORDER — DEXAMETHASONE SODIUM PHOSPHATE 10 MG/ML IJ SOLN
10.0000 mg | Freq: Once | INTRAMUSCULAR | Status: AC
  Administered 2014-07-27: 10 mg via INTRAVENOUS

## 2014-07-27 MED ORDER — EPHEDRINE SULFATE 50 MG/ML IJ SOLN
INTRAMUSCULAR | Status: AC
  Filled 2014-07-27: qty 1

## 2014-07-27 MED ORDER — POTASSIUM CHLORIDE 2 MEQ/ML IV SOLN
100.0000 mL/h | INTRAVENOUS | Status: DC
  Administered 2014-07-27 – 2014-07-28 (×2): 100 mL/h via INTRAVENOUS
  Filled 2014-07-27 (×4): qty 1000

## 2014-07-27 MED ORDER — SODIUM CHLORIDE 0.9 % IR SOLN
Status: DC | PRN
  Administered 2014-07-27: 1000 mL

## 2014-07-27 MED ORDER — ASPIRIN EC 325 MG PO TBEC
325.0000 mg | DELAYED_RELEASE_TABLET | Freq: Two times a day (BID) | ORAL | Status: DC
  Administered 2014-07-28: 325 mg via ORAL
  Filled 2014-07-27 (×3): qty 1

## 2014-07-27 MED ORDER — SODIUM CHLORIDE 0.9 % IJ SOLN
INTRAMUSCULAR | Status: AC
Start: 2014-07-27 — End: 2014-07-27
  Filled 2014-07-27: qty 10

## 2014-07-27 MED ORDER — PROPOFOL 10 MG/ML IV EMUL
INTRAVENOUS | Status: DC | PRN
  Administered 2014-07-27: 25 mg via INTRAVENOUS

## 2014-07-27 MED ORDER — CEFAZOLIN SODIUM-DEXTROSE 2-3 GM-% IV SOLR
2.0000 g | Freq: Four times a day (QID) | INTRAVENOUS | Status: AC
  Administered 2014-07-27 (×2): 2 g via INTRAVENOUS
  Filled 2014-07-27 (×2): qty 50

## 2014-07-27 MED ORDER — FERROUS SULFATE 325 (65 FE) MG PO TABS
325.0000 mg | ORAL_TABLET | Freq: Three times a day (TID) | ORAL | Status: DC
  Filled 2014-07-27 (×5): qty 1

## 2014-07-27 MED ORDER — LIDOCAINE HCL (CARDIAC) 20 MG/ML IV SOLN
INTRAVENOUS | Status: AC
Start: 2014-07-27 — End: 2014-07-27
  Filled 2014-07-27: qty 5

## 2014-07-27 MED ORDER — MENTHOL 3 MG MT LOZG: 1.0000 | LOZENGE | OROMUCOSAL | Status: DC | PRN

## 2014-07-27 MED ORDER — ALUM & MAG HYDROXIDE-SIMETH 200-200-20 MG/5ML PO SUSP: 30.0000 mL | ORAL | Status: DC | PRN

## 2014-07-27 MED ORDER — PHENYLEPHRINE HCL 10 MG/ML IJ SOLN
10.0000 mg | INTRAVENOUS | Status: DC | PRN
  Administered 2014-07-27: 20 ug/min via INTRAVENOUS

## 2014-07-27 MED ORDER — MIDAZOLAM HCL 5 MG/5ML IJ SOLN
INTRAMUSCULAR | Status: DC | PRN
  Administered 2014-07-27 (×2): 1 mg via INTRAVENOUS

## 2014-07-27 MED ORDER — ATENOLOL 50 MG PO TABS
50.0000 mg | ORAL_TABLET | Freq: Once | ORAL | Status: AC
  Administered 2014-07-27: 50 mg via ORAL
  Filled 2014-07-27: qty 1

## 2014-07-27 MED ORDER — DEXAMETHASONE SODIUM PHOSPHATE 10 MG/ML IJ SOLN
10.0000 mg | Freq: Once | INTRAMUSCULAR | Status: AC
  Administered 2014-07-28: 10 mg via INTRAVENOUS
  Filled 2014-07-27: qty 1

## 2014-07-27 MED ORDER — PHENOL 1.4 % MT LIQD
1.0000 | OROMUCOSAL | Status: DC | PRN
  Filled 2014-07-27: qty 177

## 2014-07-27 MED ORDER — DIPHENHYDRAMINE HCL 25 MG PO CAPS: 25.0000 mg | ORAL_CAPSULE | Freq: Four times a day (QID) | ORAL | Status: DC | PRN

## 2014-07-27 MED ORDER — HYDROMORPHONE HCL 1 MG/ML IJ SOLN
0.2500 mg | INTRAMUSCULAR | Status: DC | PRN
  Administered 2014-07-27 (×2): 0.5 mg via INTRAVENOUS

## 2014-07-27 MED ORDER — LACTATED RINGERS IV SOLN
INTRAVENOUS | Status: DC
  Administered 2014-07-27: 1000 mL via INTRAVENOUS

## 2014-07-27 MED ORDER — HYDROCODONE-ACETAMINOPHEN 7.5-325 MG PO TABS
1.0000 | ORAL_TABLET | ORAL | Status: DC
  Administered 2014-07-27: 1 via ORAL
  Administered 2014-07-27 (×2): 2 via ORAL
  Administered 2014-07-28: 1 via ORAL
  Administered 2014-07-28: 2 via ORAL
  Filled 2014-07-27 (×2): qty 2
  Filled 2014-07-27: qty 1
  Filled 2014-07-27 (×2): qty 2

## 2014-07-27 MED ORDER — CEFAZOLIN SODIUM-DEXTROSE 2-3 GM-% IV SOLR
2.0000 g | INTRAVENOUS | Status: AC
  Administered 2014-07-27: 2 g via INTRAVENOUS

## 2014-07-27 MED ORDER — POLYETHYLENE GLYCOL 3350 17 G PO PACK
17.0000 g | PACK | Freq: Two times a day (BID) | ORAL | Status: DC
  Administered 2014-07-27: 17 g via ORAL

## 2014-07-27 MED ORDER — PROPOFOL 10 MG/ML IV BOLUS
INTRAVENOUS | Status: AC
  Filled 2014-07-27: qty 20

## 2014-07-27 MED ORDER — METHOCARBAMOL 1000 MG/10ML IJ SOLN
500.0000 mg | Freq: Four times a day (QID) | INTRAVENOUS | Status: DC | PRN
  Administered 2014-07-27: 500 mg via INTRAVENOUS
  Filled 2014-07-27 (×2): qty 5

## 2014-07-27 MED ORDER — PRAVASTATIN SODIUM 80 MG PO TABS
80.0000 mg | ORAL_TABLET | Freq: Every day | ORAL | Status: DC
  Administered 2014-07-27: 80 mg via ORAL
  Filled 2014-07-27 (×2): qty 1

## 2014-07-27 MED ORDER — METHOCARBAMOL 500 MG PO TABS
500.0000 mg | ORAL_TABLET | Freq: Four times a day (QID) | ORAL | Status: DC | PRN
  Administered 2014-07-27 – 2014-07-28 (×2): 500 mg via ORAL
  Filled 2014-07-27 (×2): qty 1

## 2014-07-27 MED ORDER — DEXAMETHASONE SODIUM PHOSPHATE 10 MG/ML IJ SOLN
INTRAMUSCULAR | Status: AC
Start: 2014-07-27 — End: 2014-07-27
  Filled 2014-07-27: qty 1

## 2014-07-27 MED ORDER — CELECOXIB 200 MG PO CAPS
200.0000 mg | ORAL_CAPSULE | Freq: Two times a day (BID) | ORAL | Status: DC
  Administered 2014-07-27: 200 mg via ORAL
  Filled 2014-07-27 (×3): qty 1

## 2014-07-27 MED ORDER — LACTATED RINGERS IV SOLN
INTRAVENOUS | Status: DC | PRN
  Administered 2014-07-27 (×2): via INTRAVENOUS

## 2014-07-27 MED ORDER — PROMETHAZINE HCL 25 MG/ML IJ SOLN: 6.2500 mg | INTRAMUSCULAR | Status: DC | PRN

## 2014-07-27 MED ORDER — NICOTINE 14 MG/24HR TD PT24
14.0000 mg | MEDICATED_PATCH | Freq: Every day | TRANSDERMAL | Status: DC | PRN
Start: 2014-07-27 — End: 2014-07-28
  Filled 2014-07-27: qty 1

## 2014-07-27 MED ORDER — CHLORTHALIDONE 25 MG PO TABS
25.0000 mg | ORAL_TABLET | Freq: Every day | ORAL | Status: DC
  Filled 2014-07-27 (×2): qty 1

## 2014-07-27 MED ORDER — CEFAZOLIN SODIUM-DEXTROSE 2-3 GM-% IV SOLR
INTRAVENOUS | Status: AC
  Filled 2014-07-27: qty 50

## 2014-07-27 MED ORDER — BISACODYL 10 MG RE SUPP: 10.0000 mg | Freq: Every day | RECTAL | Status: DC | PRN

## 2014-07-27 MED ORDER — METOCLOPRAMIDE HCL 10 MG PO TABS: 5.0000 mg | ORAL_TABLET | Freq: Three times a day (TID) | ORAL | Status: DC | PRN

## 2014-07-27 MED ORDER — ONDANSETRON HCL 4 MG/2ML IJ SOLN
INTRAMUSCULAR | Status: DC | PRN
  Administered 2014-07-27: 4 mg via INTRAVENOUS

## 2014-07-27 MED ORDER — ATENOLOL 50 MG PO TABS
50.0000 mg | ORAL_TABLET | Freq: Every day | ORAL | Status: DC
  Filled 2014-07-27 (×2): qty 1

## 2014-07-27 MED ORDER — FUROSEMIDE 20 MG PO TABS
20.0000 mg | ORAL_TABLET | Freq: Every day | ORAL | Status: DC
  Filled 2014-07-27: qty 1

## 2014-07-27 MED ORDER — ONDANSETRON HCL 4 MG/2ML IJ SOLN: 4.0000 mg | Freq: Four times a day (QID) | INTRAMUSCULAR | Status: DC | PRN

## 2014-07-27 MED ORDER — PHENYLEPHRINE HCL 10 MG/ML IJ SOLN
INTRAMUSCULAR | Status: AC
  Filled 2014-07-27: qty 1

## 2014-07-27 MED ORDER — METOCLOPRAMIDE HCL 5 MG/ML IJ SOLN: 5.0000 mg | Freq: Three times a day (TID) | INTRAMUSCULAR | Status: DC | PRN

## 2014-07-27 MED ORDER — MIDAZOLAM HCL 2 MG/2ML IJ SOLN
INTRAMUSCULAR | Status: AC
Start: 2014-07-27 — End: 2014-07-27
  Filled 2014-07-27: qty 2

## 2014-07-27 MED ORDER — LACTATED RINGERS IV SOLN
INTRAVENOUS | Status: DC
  Administered 2014-07-27: 15:00:00 via INTRAVENOUS

## 2014-07-27 MED ORDER — ATENOLOL-CHLORTHALIDONE 50-25 MG PO TABS: 1.0000 | ORAL_TABLET | Freq: Every day | ORAL | Status: DC

## 2014-07-27 MED ORDER — EPHEDRINE SULFATE 50 MG/ML IJ SOLN
INTRAMUSCULAR | Status: DC | PRN
  Administered 2014-07-27 (×2): 10 mg via INTRAVENOUS

## 2014-07-27 MED ORDER — ONDANSETRON HCL 4 MG PO TABS: 4.0000 mg | ORAL_TABLET | Freq: Four times a day (QID) | ORAL | Status: DC | PRN

## 2014-07-27 MED ORDER — PROPOFOL INFUSION 10 MG/ML OPTIME
INTRAVENOUS | Status: DC | PRN
  Administered 2014-07-27: 75 ug/kg/min via INTRAVENOUS

## 2014-07-27 MED ORDER — HYDROMORPHONE HCL 1 MG/ML IJ SOLN
INTRAMUSCULAR | Status: AC
  Filled 2014-07-27: qty 1

## 2014-07-27 MED ORDER — PRAVASTATIN SODIUM 80 MG PO TABS
80.0000 mg | ORAL_TABLET | Freq: Every day | ORAL | Status: DC
  Filled 2014-07-27: qty 1

## 2014-07-27 MED ORDER — TRANEXAMIC ACID 100 MG/ML IV SOLN
1000.0000 mg | Freq: Once | INTRAVENOUS | Status: AC
  Administered 2014-07-27: 1000 mg via INTRAVENOUS
  Filled 2014-07-27: qty 10

## 2014-07-27 MED ORDER — DOCUSATE SODIUM 100 MG PO CAPS
100.0000 mg | ORAL_CAPSULE | Freq: Two times a day (BID) | ORAL | Status: DC
  Administered 2014-07-27: 100 mg via ORAL

## 2014-07-27 MED ORDER — HYDROMORPHONE HCL 1 MG/ML IJ SOLN
0.5000 mg | INTRAMUSCULAR | Status: DC | PRN
  Administered 2014-07-27 (×2): 0.5 mg via INTRAVENOUS
  Filled 2014-07-27 (×2): qty 1

## 2014-07-27 MED ORDER — ONDANSETRON HCL 4 MG/2ML IJ SOLN
INTRAMUSCULAR | Status: AC
  Filled 2014-07-27: qty 2

## 2014-07-27 MED ORDER — MAGNESIUM CITRATE PO SOLN: 1.0000 | Freq: Once | ORAL | Status: AC | PRN

## 2014-07-27 MED ORDER — BUPIVACAINE IN DEXTROSE 0.75-8.25 % IT SOLN
INTRATHECAL | Status: DC | PRN
  Administered 2014-07-27: 1.8 mL via INTRATHECAL

## 2014-07-27 MED ORDER — MEPERIDINE HCL 50 MG/ML IJ SOLN: 6.2500 mg | INTRAMUSCULAR | Status: DC | PRN

## 2014-07-27 SURGICAL SUPPLY — 52 items
BAG DECANTER FOR FLEXI CONT (MISCELLANEOUS) IMPLANT
BAG SPEC THK2 15X12 ZIP CLS (MISCELLANEOUS) ×1
BAG ZIPLOCK 12X15 (MISCELLANEOUS) ×2 IMPLANT
CAPT HIP TOTAL 2 ×2 IMPLANT
COVER PERINEAL POST (MISCELLANEOUS) ×3 IMPLANT
DRAPE C-ARM 42X120 X-RAY (DRAPES) ×3 IMPLANT
DRAPE STERI IOBAN 125X83 (DRAPES) ×3 IMPLANT
DRAPE U-SHAPE 47X51 STRL (DRAPES) ×9 IMPLANT
DRSG AQUACEL AG ADV 3.5X10 (GAUZE/BANDAGES/DRESSINGS) ×3 IMPLANT
DURAPREP 26ML APPLICATOR (WOUND CARE) ×3 IMPLANT
ELECT BLADE TIP CTD 4 INCH (ELECTRODE) ×3 IMPLANT
ELECT PENCIL ROCKER SW 15FT (MISCELLANEOUS) ×2 IMPLANT
ELECT REM PT RETURN 15FT ADLT (MISCELLANEOUS) ×2 IMPLANT
ELECT REM PT RETURN 9FT ADLT (ELECTROSURGICAL)
ELECTRODE REM PT RTRN 9FT ADLT (ELECTROSURGICAL) ×1 IMPLANT
FACESHIELD WRAPAROUND (MASK) ×9 IMPLANT
FACESHIELD WRAPAROUND OR TEAM (MASK) ×4 IMPLANT
GLOVE BIO SURGEON STRL SZ7 (GLOVE) ×2 IMPLANT
GLOVE BIOGEL PI IND STRL 7.0 (GLOVE) IMPLANT
GLOVE BIOGEL PI IND STRL 7.5 (GLOVE) ×1 IMPLANT
GLOVE BIOGEL PI IND STRL 8 (GLOVE) IMPLANT
GLOVE BIOGEL PI IND STRL 8.5 (GLOVE) ×1 IMPLANT
GLOVE BIOGEL PI INDICATOR 7.0 (GLOVE) ×2
GLOVE BIOGEL PI INDICATOR 7.5 (GLOVE) ×6
GLOVE BIOGEL PI INDICATOR 8 (GLOVE) ×2
GLOVE BIOGEL PI INDICATOR 8.5 (GLOVE) ×2
GLOVE ECLIPSE 7.0 STRL STRAW (GLOVE) ×2 IMPLANT
GLOVE ECLIPSE 8.0 STRL XLNG CF (GLOVE) ×6 IMPLANT
GLOVE ORTHO TXT STRL SZ7.5 (GLOVE) ×3 IMPLANT
GLOVE SURG SS PI 8.0 STRL IVOR (GLOVE) ×2 IMPLANT
GOWN SPEC L3 XXLG W/TWL (GOWN DISPOSABLE) ×3 IMPLANT
GOWN STRL REIN 2XL XLG LVL4 (GOWN DISPOSABLE) ×2 IMPLANT
GOWN STRL REUS W/TWL LRG LVL3 (GOWN DISPOSABLE) ×7 IMPLANT
HOLDER FOLEY CATH W/STRAP (MISCELLANEOUS) ×3 IMPLANT
KIT BASIN OR (CUSTOM PROCEDURE TRAY) ×3 IMPLANT
LIQUID BAND (GAUZE/BANDAGES/DRESSINGS) ×3 IMPLANT
NDL SAFETY ECLIPSE 18X1.5 (NEEDLE) IMPLANT
NEEDLE HYPO 18GX1.5 SHARP (NEEDLE)
PACK TOTAL JOINT (CUSTOM PROCEDURE TRAY) ×3 IMPLANT
PEN SKIN MARKING BROAD (MISCELLANEOUS) ×3 IMPLANT
SAW OSC TIP CART 19.5X105X1.3 (SAW) ×3 IMPLANT
SUT MNCRL AB 4-0 PS2 18 (SUTURE) ×3 IMPLANT
SUT VIC AB 1 CT1 36 (SUTURE) ×9 IMPLANT
SUT VIC AB 2-0 CT1 27 (SUTURE) ×6
SUT VIC AB 2-0 CT1 TAPERPNT 27 (SUTURE) ×2 IMPLANT
SUT VLOC 180 0 24IN GS25 (SUTURE) ×3 IMPLANT
SYR 50ML LL SCALE MARK (SYRINGE) IMPLANT
TOWEL OR 17X26 10 PK STRL BLUE (TOWEL DISPOSABLE) ×3 IMPLANT
TOWEL OR NON WOVEN STRL DISP B (DISPOSABLE) ×2 IMPLANT
TRAY FOLEY CATH 14FRSI W/METER (CATHETERS) ×3 IMPLANT
WATER STERILE IRR 1500ML POUR (IV SOLUTION) ×3 IMPLANT
YANKAUER SUCT BULB TIP NO VENT (SUCTIONS) ×1 IMPLANT

## 2014-07-27 NOTE — Progress Notes (Signed)
Husband has CPAP mask and hose

## 2014-07-27 NOTE — Evaluation (Signed)
Physical Therapy Evaluation Patient Details Name: Diane Giles MRN: [id number] DOB: 28-Oct-1955 Today's Date: 07/27/2014   History of Present Illness  59 yo female s/p R THA-DA 07/27/14.   Clinical Impression  On eval POD 0, pt required Min assist for mobility-able to ambulate ~45 feet with RW. Discussed d/c plan-pt plans to return home with husband's assistance. Recommend HHPT.     Follow Up Recommendations Home health PT    Equipment Recommendations  Rolling walker with 5" wheels    Recommendations for Other Services OT consult     Precautions / Restrictions Restrictions Weight Bearing Restrictions: No RLE Weight Bearing: Weight bearing as tolerated      Mobility  Bed Mobility Overal bed mobility: Needs Assistance Bed Mobility: Supine to Sit     Supine to sit: Min assist     General bed mobility comments: Assist for R LE.   Transfers Overall transfer level: Needs assistance Equipment used: Rolling walker (2 wheeled) Transfers: Sit to/from Stand Sit to Stand: Min assist         General transfer comment: Assist to rise, stabilize, control descent. VCs safety, technique, hand placement  Ambulation/Gait Ambulation/Gait assistance: Min assist Ambulation Distance (Feet): 45 Feet Assistive device: Rolling walker (2 wheeled) Gait Pattern/deviations: Step-to pattern;Step-through pattern;Decreased stride length     General Gait Details: Assist to stabilize intermittently. VCs safety, sequence.   Stairs            Wheelchair Mobility    Modified Rankin (Stroke Patients Only)       Balance                                             Pertinent Vitals/Pain Pain Assessment: 0-10 Pain Score: 4  Pain Location: R hip/thigh area Pain Descriptors / Indicators: Aching;Sore Pain Intervention(s): Monitored during session;Ice applied;Repositioned    Home Living Family/patient expects to be discharged to:: Private residence Living  Arrangements: Spouse/significant other Available Help at Discharge: Family Type of Home: House Home Access: Stairs to enter Entrance Stairs-Rails: Doctor, general practice of Steps: 3 Home Layout: Able to live on main level with bedroom/bathroom;Two level Home Equipment: Cane - single point      Prior Function Level of Independence: Independent with assistive device(s)         Comments: using cane     Hand Dominance        Extremity/Trunk Assessment   Upper Extremity Assessment: Defer to OT evaluation           Lower Extremity Assessment: RLE deficits/detail RLE Deficits / Details: moves ankle well. At least: hip flex 2/5, hip abd/add 2/5    Cervical / Trunk Assessment: Normal  Communication   Communication: No difficulties  Cognition Arousal/Alertness: Awake/alert Behavior During Therapy: WFL for tasks assessed/performed Overall Cognitive Status: Within Functional Limits for tasks assessed                      General Comments      Exercises        Assessment/Plan    PT Assessment Patient needs continued PT services  PT Diagnosis Difficulty walking;Acute pain   PT Problem List Decreased strength;Decreased range of motion;Decreased balance;Decreased mobility;Obesity;Decreased knowledge of use of DME;Pain  PT Treatment Interventions DME instruction;Gait training;Stair training;Functional mobility training;Therapeutic activities;Therapeutic exercise;Patient/family education;Balance training   PT Goals (Current goals can be found in  the Care Plan section) Acute Rehab PT Goals Patient Stated Goal: home. PT Goal Formulation: With patient Time For Goal Achievement: 08/03/14 Potential to Achieve Goals: Good    Frequency 7X/week   Barriers to discharge        Co-evaluation               End of Session   Activity Tolerance: Patient tolerated treatment well Patient left: in chair;with call bell/phone within reach            Time: 1308-6578 PT Time Calculation (min) (ACUTE ONLY): 9 min   Charges:   PT Evaluation $Initial PT Evaluation Tier I: 1 Procedure     PT G Codes:        Rebeca Alert, MPT Pager: [phone]

## 2014-07-27 NOTE — Anesthesia Procedure Notes (Signed)
Spinal Patient location during procedure: OR Start time: 07/27/2014 9:34 AM End time: 07/27/2014 9:38 AM Staffing Resident/CRNA: Arvie Villarruel G Performed by: resident/CRNA  Preanesthetic Checklist Completed: patient identified, site marked, surgical consent, pre-op evaluation, timeout performed, IV checked, risks and benefits discussed and monitors and equipment checked Spinal Block Patient position: sitting Prep: Betadine Patient monitoring: heart rate, continuous pulse ox and blood pressure Approach: midline Location: L2-3 Injection technique: single-shot Needle Needle type: Sprotte  Needle gauge: 24 G Needle length: 9 cm Needle insertion depth: 6 cm Assessment Sensory level: T6

## 2014-07-27 NOTE — Progress Notes (Signed)
Per PT- needs CPAP unit only (brought mask and circuit). PT needs CPAP order.

## 2014-07-27 NOTE — Transfer of Care (Signed)
Immediate Anesthesia Transfer of Care Note  Patient: Diane Giles  Procedure(s) Performed: Procedure(s): RIGHT TOTAL HIP ARTHROPLASTY ANTERIOR APPROACH (Right)  Patient Location: PACU  Anesthesia Type:Spinal  Level of Consciousness: awake, alert  and oriented  Airway & Oxygen Therapy: Patient Spontanous Breathing and Patient connected to face mask oxygen  Post-op Assessment: Report given to RN and Post -op Vital signs reviewed and stable  Post vital signs: Reviewed and stable  Last Vitals:  Filed Vitals:   07/27/14 1132  BP:   Pulse: 81  Temp:   Resp:     Complications: No apparent anesthesia complications

## 2014-07-27 NOTE — Anesthesia Preprocedure Evaluation (Addendum)
Anesthesia Evaluation    Airway        Dental   Pulmonary sleep apnea , Current Smoker,          Cardiovascular hypertension, Pt. on medications     Neuro/Psych    GI/Hepatic   Endo/Other    Renal/GU      Musculoskeletal   Abdominal   Peds  Hematology   Anesthesia Other Findings   Reproductive/Obstetrics                            Anesthesia Physical Anesthesia Plan  ASA: III  Anesthesia Plan: Spinal   Post-op Pain Management:    Induction:   Airway Management Planned: Simple Face Mask  Additional Equipment:   Intra-op Plan:   Post-operative Plan:   Informed Consent: I have reviewed the patients History and Physical, chart, labs and discussed the procedure including the risks, benefits and alternatives for the proposed anesthesia with the patient or authorized representative who has indicated his/her understanding and acceptance.   Dental advisory given  Plan Discussed with: CRNA  Anesthesia Plan Comments:         Anesthesia Quick Evaluation

## 2014-07-27 NOTE — Op Note (Signed)
NAME:  Diane Giles                ACCOUNT NO.: [id number]      MEDICAL RECORD NO.: [id number]      FACILITY:  [facility]      PHYSICIAN:  Durene Romans D  DATE OF BIRTH:  15-Sep-1955     DATE OF PROCEDURE:  07/27/2014                                 OPERATIVE REPORT         PREOPERATIVE DIAGNOSIS: Right  hip osteoarthritis.      POSTOPERATIVE DIAGNOSIS:  Right hip osteoarthritis.      PROCEDURE:  Right total hip replacement through an anterior approach   utilizing DePuy THR system, component size 48mm pinnacle cup, a size 32+4 neutral   Altrex liner, a size 1 Hi Tri Lock stem with a 32+1 delta ceramic   ball.      SURGEON:  Madlyn Frankel. Charlann Boxer, M.D.      ASSISTANT:  Lanney Gins, PA-C      ANESTHESIA:  Spinal.      SPECIMENS:  None.      COMPLICATIONS:  None.      BLOOD LOSS:  300 cc     DRAINS:  None.      INDICATION OF THE PROCEDURE:  Diane Giles is a 59 y.o. female who had   presented to office for evaluation of right hip pain.  Radiographs revealed   progressive degenerative changes with bone-on-bone   articulation to the  hip joint.  The patient had painful limited range of   motion significantly affecting their overall quality of life.  The patient was failing to    respond to conservative measures, and at this point was ready   to proceed with more definitive measures.  The patient has noted progressive   degenerative changes in his hip, progressive problems and dysfunction   with regarding the hip prior to surgery.  Consent was obtained for   benefit of pain relief.  Specific risk of infection, DVT, component   failure, dislocation, need for revision surgery, as well discussion of   the anterior versus posterior approach were reviewed.  Consent was   obtained for benefit of anterior pain relief through an anterior   approach.      PROCEDURE IN DETAIL:  The patient was brought to operative theater.   Once adequate anesthesia,  preoperative antibiotics, 2gm of Ancef, 1gm of Tranexamic Acid, and 10mg  of Decadron administered.   The patient was positioned supine on the OSI Hanna table.  Once adequate   padding of boney process was carried out, we had predraped out the hip, and  used fluoroscopy to confirm orientation of the pelvis and position.      The right hip was then prepped and draped from proximal iliac crest to   mid thigh with shower curtain technique.      Time-out was performed identifying the patient, planned procedure, and   extremity.     An incision was then made 2 cm distal and lateral to the   anterior superior iliac spine extending over the orientation of the   tensor fascia lata muscle and sharp dissection was carried down to the   fascia of the muscle and protractor placed in the soft tissues.      The fascia was then  incised.  The muscle belly was identified and swept   laterally and retractor placed along the superior neck.  Following   cauterization of the circumflex vessels and removing some pericapsular   fat, a second cobra retractor was placed on the inferior neck.  A third   retractor was placed on the anterior acetabulum after elevating the   anterior rectus.  A L-capsulotomy was along the line of the   superior neck to the trochanteric fossa, then extended proximally and   distally.  Tag sutures were placed and the retractors were then placed   intracapsular.  We then identified the trochanteric fossa and   orientation of my neck cut, confirmed this radiographically   and then made a neck osteotomy with the femur on traction.  The femoral   head was removed without difficulty or complication.  Traction was let   off and retractors were placed posterior and anterior around the   acetabulum.      The labrum and foveal tissue were debrided.  I began reaming with a 43mm   reamer and reamed up to 47mm reamer with good bony bed preparation and a 48mm   cup was chosen.  The final 48mm  Pinnacle cup was then impacted under fluoroscopy  to confirm the depth of penetration and orientation with respect to   abduction.  A screw was placed followed by the hole eliminator.  The final   32+4 neutral Altrex liner was impacted with good visualized rim fit.  The cup was positioned anatomically within the acetabular portion of the pelvis.      At this point, the femur was rolled at 80 degrees.  Further capsule was   released off the inferior aspect of the femoral neck.  I then   released the superior capsule proximally.  The hook was placed laterally   along the femur and elevated manually and held in position with the bed   hook.  The leg was then extended and adducted with the leg rolled to 100   degrees of external rotation.  Once the proximal femur was fully   exposed, I used a box osteotome to set orientation.  I then began   broaching with the starting chili pepper broach and passed this by hand and then broached up to 1.  With the 1 broach in place I chose a high offset neck and did a trial reduction.  The offset was appropriate, leg lengths   appeared to be equal, confirmed radiographically.   Given these findings, I went ahead and dislocated the hip, repositioned all   retractors and positioned the right hip in the extended and abducted position.  The final 1 Hi Tri Lock stem was   chosen and it was impacted down to the level of neck cut.  Based on this   and the trial reduction, a 32+1 delta ceramic ball was chosen and   impacted onto a clean and dry trunnion, and the hip was reduced.  The   hip had been irrigated throughout the case again at this point.  I did   reapproximate the superior capsular leaflet to the anterior leaflet   using #1 Vicryl.  The fascia of the   tensor fascia lata muscle was then reapproximated using #1 Vicryl and #0 V-lock sutures.  The   remaining wound was closed with 2-0 Vicryl and running 4-0 Monocryl.   The hip was cleaned, dried, and dressed  sterilely using Dermabond and   Aquacel  dressing.  She was then brought   to recovery room in stable condition tolerating the procedure well.    Lanney Gins, PA-C was present for the entirety of the case involved from   preoperative positioning, perioperative retractor management, general   facilitation of the case, as well as primary wound closure as assistant.            Madlyn Frankel Charlann Boxer, M.D.        07/27/2014 11:06 AM

## 2014-07-27 NOTE — Interval H&P Note (Signed)
History and Physical Interval Note:  07/27/2014 8:30 AM  Diane Giles  has presented today for surgery, with the diagnosis of right hip osteoarthritis  The various methods of treatment have been discussed with the patient and family. After consideration of risks, benefits and other options for treatment, the patient has consented to  Procedure(s): RIGHT TOTAL HIP ARTHROPLASTY ANTERIOR APPROACH (Right) as a surgical intervention .  The patient's history has been reviewed, patient examined, no change in status, stable for surgery.  I have reviewed the patient's chart and labs.  Questions were answered to the patient's satisfaction.     Mauri Pole

## 2014-07-27 NOTE — Progress Notes (Signed)
RT placed patient on CPAP. Patient is on home setting of auto 7-14 cmH2O. RT added sterile water for humidification. 2 Liters oxygen is bleed into tubing. Patient is tolerating well at this time. RT will assess and monitor as needed.

## 2014-07-27 NOTE — Anesthesia Postprocedure Evaluation (Signed)
  Anesthesia Post-op Note  Patient: Diane Giles  Procedure(s) Performed: Procedure(s) (LRB): RIGHT TOTAL HIP ARTHROPLASTY ANTERIOR APPROACH (Right)  Patient Location: PACU  Anesthesia Type: Spinal  Level of Consciousness: awake and alert   Airway and Oxygen Therapy: Patient Spontanous Breathing  Post-op Pain: mild  Post-op Assessment: Post-op Vital signs reviewed, Patient's Cardiovascular Status Stable, Respiratory Function Stable, Patent Airway and No signs of Nausea or vomiting  Last Vitals:  Filed Vitals:   07/27/14 1700  BP: 126/44  Pulse: 74  Temp: 36.6 C  Resp: 16    Post-op Vital Signs: stable   Complications: No apparent anesthesia complications

## 2014-07-28 LAB — CBC
HCT: 31 % — ABNORMAL LOW (ref 36.0–46.0)
Hemoglobin: 10.3 g/dL — ABNORMAL LOW (ref 12.0–15.0)
MCH: 31.1 pg (ref 26.0–34.0)
MCHC: 33.2 g/dL (ref 30.0–36.0)
MCV: 93.7 fL (ref 78.0–100.0)
Platelets: 222 10*3/uL (ref 150–400)
RBC: 3.31 MIL/uL — ABNORMAL LOW (ref 3.87–5.11)
RDW: 13.3 % (ref 11.5–15.5)
WBC: 15.3 10*3/uL — ABNORMAL HIGH (ref 4.0–10.5)

## 2014-07-28 LAB — BASIC METABOLIC PANEL
ANION GAP: 10 (ref 5–15)
BUN: 21 mg/dL (ref 6–23)
CALCIUM: 8.1 mg/dL — AB (ref 8.4–10.5)
CHLORIDE: 99 mmol/L (ref 96–112)
CO2: 25 mmol/L (ref 19–32)
CREATININE: 0.65 mg/dL (ref 0.50–1.10)
Glucose, Bld: 236 mg/dL — ABNORMAL HIGH (ref 70–99)
Potassium: 3.8 mmol/L (ref 3.5–5.1)
Sodium: 134 mmol/L — ABNORMAL LOW (ref 135–145)

## 2014-07-28 MED ORDER — DOCUSATE SODIUM 100 MG PO CAPS: 100.0000 mg | ORAL_CAPSULE | Freq: Two times a day (BID) | ORAL | Status: DC

## 2014-07-28 MED ORDER — POLYETHYLENE GLYCOL 3350 17 G PO PACK: 17.0000 g | PACK | Freq: Two times a day (BID) | ORAL | Status: DC

## 2014-07-28 MED ORDER — FERROUS SULFATE 325 (65 FE) MG PO TABS: 325.0000 mg | ORAL_TABLET | Freq: Three times a day (TID) | ORAL | Status: DC

## 2014-07-28 MED ORDER — HYDROCODONE-ACETAMINOPHEN 7.5-325 MG PO TABS: 1.0000 | ORAL_TABLET | ORAL | Status: DC | PRN

## 2014-07-28 MED ORDER — ASPIRIN 325 MG PO TBEC: 325.0000 mg | DELAYED_RELEASE_TABLET | Freq: Two times a day (BID) | ORAL | Status: AC

## 2014-07-28 MED ORDER — METHOCARBAMOL 500 MG PO TABS: 500.0000 mg | ORAL_TABLET | Freq: Four times a day (QID) | ORAL | Status: DC | PRN

## 2014-07-28 MED ORDER — SODIUM CHLORIDE 0.9 % IV BOLUS (SEPSIS)
500.0000 mL | Freq: Once | INTRAVENOUS | Status: AC
  Administered 2014-07-28: 500 mL via INTRAVENOUS

## 2014-07-28 NOTE — Discharge Instructions (Signed)

## 2014-07-28 NOTE — Evaluation (Signed)
Occupational Therapy Evaluation Patient Details Name: Diane Giles MRN: [id number] DOB: 13-Jun-1955 Today's Date: 07/28/2014    History of Present Illness 59 yo female s/p R THA-DA 07/27/14.    Clinical Impression   Pt doing well. Already dressed for d/c. Educated on safety with getting dressed, showering, and toileting. 3in1 in room and discussed how to use and adjust. All education completed and spouse practiced assisting pt into bathroom and to the toilet and back to chair. All OT needs addressed for d/c.    Follow Up Recommendations  No OT follow up;Supervision/Assistance - 24 hour    Equipment Recommendations  3 in 1 bedside comode (in room)    Recommendations for Other Services       Precautions / Restrictions Restrictions Weight Bearing Restrictions: No RLE Weight Bearing: Weight bearing as tolerated      Mobility Bed Mobility               General bed mobility comments: pt oob in recliner  Transfers Overall transfer level: Needs assistance Equipment used: Rolling walker (2 wheeled) Transfers: Sit to/from Stand Sit to Stand: Min guard         General transfer comment: close guard for safety and verbal cues for hand placement.    Balance                                            ADL Overall ADL's : Needs assistance/impaired Eating/Feeding: Independent;Sitting   Grooming: Wash/dry hands;Min guard;Standing   Upper Body Bathing: Set up;Sitting   Lower Body Bathing: Minimal assistance;Sit to/from stand   Upper Body Dressing : Set up;Sitting   Lower Body Dressing: Minimal assistance;Sit to/from stand   Toilet Transfer: Min guard;Ambulation;Cueing for safety;BSC;RW   Toileting- Clothing Manipulation and Hygiene: Min guard;Sit to/from stand         General ADL Comments: Pt states spouse will assist with LB self care. educated on sequence for LB dressing and need for walker to be in front of her already by the time she  stands up. Educated on 3in1 and how to adjust for placement of toilet and also use as BSC at night if desired. Pt has a tub so educateed on use of 3in1 in tub if it will fit and how to sit back on 3in1 from outside of tub to bathe. She has a handheld shower. Also discussed option of a tubseat and where to obtain/coverage. Husband and pt verbalize understanding of tub DME options and can sponge bathe iniitally also if needed.      Vision     Perception     Praxis      Pertinent Vitals/Pain Pain Assessment: 0-10 Pain Score: 1  Pain Location: R hip Pain Descriptors / Indicators: Sore Pain Intervention(s): Repositioned     Hand Dominance     Extremity/Trunk Assessment Upper Extremity Assessment Upper Extremity Assessment: Overall WFL for tasks assessed           Communication Communication Communication: No difficulties   Cognition Arousal/Alertness: Awake/alert Behavior During Therapy: WFL for tasks assessed/performed Overall Cognitive Status: Within Functional Limits for tasks assessed                     General Comments       Exercises       Shoulder Instructions      Home Living Family/patient expects  to be discharged to:: Private residence Living Arrangements: Spouse/significant other Available Help at Discharge: Family Type of Home: House Home Access: Stairs to enter Secretary/administrator of Steps: 3 Entrance Stairs-Rails: Right;Left Home Layout: Able to live on main level with bedroom/bathroom;Two level     Bathroom Shower/Tub: Chief Strategy Officer: Standard     Home Equipment: Cane - single point          Prior Functioning/Environment Level of Independence: Independent with assistive device(s)        Comments: using cane    OT Diagnosis: Generalized weakness   OT Problem List:     OT Treatment/Interventions:      OT Goals(Current goals can be found in the care plan section) Acute Rehab OT Goals Patient Stated  Goal: home. OT Goal Formulation: With patient/family  OT Frequency:     Barriers to D/C:            Co-evaluation              End of Session Equipment Utilized During Treatment: Rolling walker  Activity Tolerance: Patient tolerated treatment well Patient left: in chair;with call bell/phone within reach;with family/visitor present   Time: 1140-1153 OT Time Calculation (min): 13 min Charges:  OT General Charges $OT Visit: 1 Procedure OT Evaluation $Initial OT Evaluation Tier I: 1 Procedure G-Codes:    Lennox Laity  [phone] 07/28/2014, 12:02 PM

## 2014-07-28 NOTE — Progress Notes (Addendum)
Physical Therapy Treatment Patient Details Name: Diane Giles MRN: [id number] DOB: 07/03/1955 Today's Date: 07/28/2014    History of Present Illness 59 yo female s/p R THA-DA 07/27/14.     PT Comments    Progressing very well with mobility. All education completed. Ready to d/c from PT standpoint. Pt reports mild lightheadedness during session but tolerated activity well.   Follow Up Recommendations  Home health PT     Equipment Recommendations  Rolling walker with 5" wheels    Recommendations for Other Services OT consult     Precautions / Restrictions Restrictions Weight Bearing Restrictions: No    Mobility  Bed Mobility               General bed mobility comments: pt oob in recliner  Transfers Overall transfer level: Needs assistance Equipment used: Rolling walker (2 wheeled) Transfers: Sit to/from Stand Sit to Stand: Min guard         General transfer comment: close guard for safety. VCs hand placement  Ambulation/Gait Ambulation/Gait assistance: Min guard Ambulation Distance (Feet): 115 Feet Assistive device: Rolling walker (2 wheeled) Gait Pattern/deviations: Step-through pattern;Decreased stride length;Antalgic     General Gait Details: close guard for safety.    Stairs Stairs: Yes Stairs assistance: Min assist Stair Management: Step to pattern;Forwards;One rail Left Number of Stairs: 2 General stair comments: One rail L, 1 HHA right. VCs safety, technique, seqeunce.   Wheelchair Mobility    Modified Rankin (Stroke Patients Only)       Balance                                    Cognition Arousal/Alertness: Awake/alert Behavior During Therapy: WFL for tasks assessed/performed Overall Cognitive Status: Within Functional Limits for tasks assessed                      Exercises Total Joint Exercises Ankle Circles/Pumps: AROM;Both;10 reps;Seated Quad Sets: AROM;Both;10 reps;Seated Heel Slides:  AAROM;Right;10 reps;Seated Hip ABduction/ADduction: AAROM;Right;10 reps;Seated    General Comments        Pertinent Vitals/Pain Pain Assessment: 0-10 Pain Score: 5  Pain Location: R hip/thigh area Pain Descriptors / Indicators: Aching;Sore Pain Intervention(s): Monitored during session;Ice applied;Repositioned    Home Living                      Prior Function            PT Goals (current goals can now be found in the care plan section) Progress towards PT goals: Progressing toward goals    Frequency  7X/week    PT Plan Current plan remains appropriate    Co-evaluation             End of Session Equipment Utilized During Treatment: Gait belt Activity Tolerance: Patient tolerated treatment well Patient left: in chair;with call bell/phone within reach;with family/visitor present     Time: 1448-1856 PT Time Calculation (min) (ACUTE ONLY): 18 min  Charges:  $Gait Training: 8-22 mins                    G Codes:      Weston Anna, MPT Pager: [phone]

## 2014-07-28 NOTE — Care Management Note (Signed)
    Page 1 of 2   07/28/2014     1:32:22 PM CARE MANAGEMENT NOTE 07/28/2014  Patient:  Diane Giles,Diane Giles   Account Number:  [id number]  Date Initiated:  07/28/2014  Documentation initiated by:  [facility]  Subjective/Objective Assessment:   adm: RIGHT TOTAL HIP ARTHROPLASTY ANTERIOR APPROACH (Right)     Action/Plan:   discharge planning   Anticipated DC Date:  07/28/2014   Anticipated DC Plan:  [place]  CM consult      [facility] Choice  HOME HEALTH   Choice offered to / List presented to:  [name] Patient   DME arranged  3-N-1  Vassie Moselle      DME agency  [place] arranged  [place]   Status of service:  Completed, signed off Medicare Important Message given?   (If response is "NO", the following Medicare IM given date fields will be blank) Date Medicare IM given:   Medicare IM given by:   Date Additional Medicare IM given:   Additional Medicare IM given by:    Discharge Disposition:  [place]  Per UR Regulation:  Reviewed for med. necessity/level of care/duration of stay  If discussed at [place] of Stay Meetings, dates discussed:    Comments:  07/28/14 08:45 Cm met with pt in room to offer choice ofhome health agency.  Pt chooses [facility] to render HHPT.  Address and contact information verified by pt.  CM called [facility] DME rep, Lecretia to please deliver the rolling walker and 3n1 to the room prior to discharge.  Referral emailed to [facility] rep, TIm for HHPT.  No other Cm needs were communicated.  Mariane Masters, BSn, [place].

## 2014-07-28 NOTE — Progress Notes (Signed)
     Subjective: 1 Day Post-Op Procedure(s) (LRB): RIGHT TOTAL HIP ARTHROPLASTY ANTERIOR APPROACH (Right)   Patient reports pain as mild, pain controlled. No events throughout the night. Ready to be discharged home if she continues to do well.   Objective:   VITALS:   Filed Vitals:   07/28/14  BP: 97/42  Pulse: 72  Temp: 98.1 F (36.7 C)   Resp:     Dorsiflexion/Plantar flexion intact Incision: dressing C/D/I No cellulitis present Compartment soft  LABS  Recent Labs  07/28/14 0420  HGB 10.3*  HCT 31.0*  WBC 15.3*  PLT 222     Recent Labs  07/28/14 0420  NA 134*  K 3.8  BUN 21  CREATININE 0.65  GLUCOSE 236*     Assessment/Plan: 1 Day Post-Op Procedure(s) (LRB): RIGHT TOTAL HIP ARTHROPLASTY ANTERIOR APPROACH (Right) Foley cath d/c'ed Advance diet Up with therapy D/C IV fluids Discharge home with home health  Follow up in 2 weeks at [facility]. Follow up with OLIN,Jorma Tassinari D in 2 weeks.  Contact information:  [facility] [address], [place] [address] [phone]    Morbid Obesity (BMI >40)  Estimated body mass index is 41.7 kg/(m^2) as calculated from the following:   Height as of this encounter: 4' 10.5" (1.486 m).   Weight as of this encounter: 92.08 kg (203 lb). Patient also counseled that weight may inhibit the healing process Patient counseled that losing weight will help with future health issues        Diane Giles. Diane Giles   PAC  07/28/2014, 8:38 AM

## 2014-07-30 NOTE — Discharge Summary (Signed)
Physician Discharge Summary  Patient ID: Diane Giles MRN: [id number] DOB/AGE: 61-May-1957 61 y.o.  Admit date: 07/27/2014 Discharge date: 07/28/2014   Procedures:  Procedure(s) (LRB): RIGHT TOTAL HIP ARTHROPLASTY ANTERIOR APPROACH (Right)  Attending Physician:  Dr. Durene Romans   Admission Diagnoses:   Right hip primary OA / pain  Discharge Diagnoses:  Principal Problem:   S/P right THA, AA Active Problems:   Morbid obesity  Past Medical History  Diagnosis Date  . Osteoporosis   . Sleep apnea   . Diverticulitis   . Hypertension   . Arthritis     HPI:    Diane Giles, 59 y.o. female, has a history of pain and functional disability in the right hip(s) due to arthritis and patient has failed non-surgical conservative treatments for greater than 12 weeks to include NSAID's and/or analgesics, corticosteriod injections, use of assistive devices and activity modification. Onset of symptoms was gradual starting 4+ years ago with gradually worsening course since that time.The patient noted no past surgery on the right hip(s). Patient currently rates pain in the right hip at 10 out of 10 with activity. Patient has worsening of pain with activity and weight bearing, trendelenberg gait, pain that interfers with activities of daily living, pain with passive range of motion, crepitus and joint swelling. Patient has evidence of periarticular osteophytes and joint space narrowing by imaging studies. This condition presents safety issues increasing the risk of falls. There is no current active infection. Risks, benefits and expectations were discussed with the patient. Risks including but not limited to the risk of anesthesia, blood clots, nerve damage, blood vessel damage, failure of the prosthesis, infection and up to and including death. Patient understand the risks, benefits and expectations and wishes to proceed with surgery.   PCP: Norberto Sorenson, MD   Discharged Condition:  good  Hospital Course:  Patient underwent the above stated procedure on 07/27/2014. Patient tolerated the procedure well and brought to the recovery room in good condition and subsequently to the floor.  POD #1 BP: 97/42 ; Pulse: 72 ; Temp: 98.1 F (36.7 C)  Patient reports pain as mild, pain controlled. No events throughout the night. Ready to be discharged home. Dorsiflexion/plantar flexion intact, incision: dressing C/D/I, no cellulitis present and compartment soft.   LABS  Basename    HGB  10.3  HCT  31.0    Discharge Exam: General appearance: alert, cooperative and no distress Extremities: Homans sign is negative, no sign of DVT, no edema, redness or tenderness in the calves or thighs and no ulcers, gangrene or trophic changes  Disposition: Home with follow up in 2 weeks   Follow-up Information    Follow up with Shelda Pal, MD. Schedule an appointment as soon as possible for a visit in 2 weeks.   Specialty:  Orthopedic Surgery   Contact information:   [address] [place] [address] [phone]       Follow up with [facility].   Why:  home health physical therapy   Contact information:   [address] [place] [address] [phone]       Follow up with [facility].   Why:  3n1 (over the commode seat) and rolling walker   Contact information:   [address] [place] [address] [phone]       Discharge Instructions    Call MD / Call 911    Complete by:  As directed  If you experience chest pain or shortness of breath, CALL 911 and be transported to the hospital emergency room.  If you develope a fever above 101 F, pus (white drainage) or increased drainage or redness at the wound, or calf pain, call your surgeon's office.     Change dressing    Complete by:  As directed   Maintain surgical dressing until follow up in the clinic. If the edges start to pull up, may reinforce with  tape. If the dressing is no longer working, may remove and cover with gauze and tape, but must keep the area dry and clean.  Call with any questions or concerns.     Constipation Prevention    Complete by:  As directed   Drink plenty of fluids.  Prune juice may be helpful.  You may use a stool softener, such as Colace (over the counter) 100 mg twice a day.  Use MiraLax (over the counter) for constipation as needed.     Diet - low sodium heart healthy    Complete by:  As directed      Discharge instructions    Complete by:  As directed   Maintain surgical dressing until follow up in the clinic. If the edges start to pull up, may reinforce with tape. If the dressing is no longer working, may remove and cover with gauze and tape, but must keep the area dry and clean.  Follow up in 2 weeks at [facility]. Call with any questions or concerns.     Increase activity slowly as tolerated    Complete by:  As directed      TED hose    Complete by:  As directed   Use stockings (TED hose) for 2 weeks on both leg(s).  You may remove them at night for sleeping.     Weight bearing as tolerated    Complete by:  As directed   Laterality:  right  Extremity:  Lower             Medication List    STOP taking these medications        fluconazole 150 MG tablet  Commonly known as:  DIFLUCAN     metroNIDAZOLE 500 MG tablet  Commonly known as:  FLAGYL     nabumetone 500 MG tablet  Commonly known as:  RELAFEN     nystatin 100000 UNIT/GM Powd      TAKE these medications        acetic acid-hydrocortisone otic solution  Commonly known as:  VOSOL-HC  Place 3 drops into both ears 3 (three) times daily.     aspirin 325 MG EC tablet  Take 1 tablet (325 mg total) by mouth 2 (two) times daily.     atenolol-chlorthalidone 50-25 MG per tablet  Commonly known as:  TENORETIC  Take 1 tablet by mouth daily.     docusate sodium 100 MG capsule  Commonly known as:  COLACE  Take 1 capsule (100  mg total) by mouth 2 (two) times daily.     ferrous sulfate 325 (65 FE) MG tablet  Take 1 tablet (325 mg total) by mouth 3 (three) times daily after meals.     furosemide 20 MG tablet  Commonly known as:  LASIX  Take 1 tablet (20 mg total) by mouth daily.     HYDROcodone-acetaminophen 7.5-325 MG per tablet  Commonly known as:  NORCO  Take 1-2 tablets by mouth every 4 (four) hours as needed for moderate  pain.     methocarbamol 500 MG tablet  Commonly known as:  ROBAXIN  Take 1 tablet (500 mg total) by mouth every 6 (six) hours as needed for muscle spasms.     OMEGA 3 PO  Take 1 capsule by mouth 2 (two) times daily.     polyethylene glycol packet  Commonly known as:  MIRALAX / GLYCOLAX  Take 17 g by mouth 2 (two) times daily.     pravastatin 80 MG tablet  Commonly known as:  PRAVACHOL  Take 1 tablet (80 mg total) by mouth daily.     Vitamin D (Ergocalciferol) 50000 UNITS Caps capsule  Commonly known as:  DRISDOL  Take 1 capsule (50,000 Units total) by mouth every 7 (seven) days.         Signed: Anastasio Auerbach. Dona Walby   PA-C  07/30/2014, 12:13 PM

## 2014-08-29 ENCOUNTER — Other Ambulatory Visit: Payer: Self-pay | Admitting: Family Medicine

## 2014-09-16 ENCOUNTER — Other Ambulatory Visit: Payer: Self-pay | Admitting: Family Medicine

## 2014-09-17 ENCOUNTER — Encounter: Payer: Self-pay | Admitting: Family Medicine

## 2014-09-17 ENCOUNTER — Ambulatory Visit (INDEPENDENT_AMBULATORY_CARE_PROVIDER_SITE_OTHER): Payer: 59 | Admitting: Family Medicine

## 2014-09-17 VITALS — BP 127/76 | HR 90 | Temp 98.6°F | Resp 16 | Ht 59.0 in | Wt 200.6 lb

## 2014-09-17 DIAGNOSIS — E559 Vitamin D deficiency, unspecified: Secondary | ICD-10-CM | POA: Diagnosis not present

## 2014-09-17 DIAGNOSIS — M810 Age-related osteoporosis without current pathological fracture: Secondary | ICD-10-CM

## 2014-09-17 DIAGNOSIS — Z79899 Other long term (current) drug therapy: Secondary | ICD-10-CM

## 2014-09-17 DIAGNOSIS — R7303 Prediabetes: Secondary | ICD-10-CM

## 2014-09-17 DIAGNOSIS — D509 Iron deficiency anemia, unspecified: Secondary | ICD-10-CM

## 2014-09-17 DIAGNOSIS — G4733 Obstructive sleep apnea (adult) (pediatric): Secondary | ICD-10-CM | POA: Diagnosis not present

## 2014-09-17 DIAGNOSIS — Z720 Tobacco use: Secondary | ICD-10-CM | POA: Diagnosis not present

## 2014-09-17 DIAGNOSIS — R7309 Other abnormal glucose: Secondary | ICD-10-CM

## 2014-09-17 DIAGNOSIS — E785 Hyperlipidemia, unspecified: Secondary | ICD-10-CM | POA: Diagnosis not present

## 2014-09-17 DIAGNOSIS — E8881 Metabolic syndrome: Secondary | ICD-10-CM | POA: Diagnosis not present

## 2014-09-17 DIAGNOSIS — F17200 Nicotine dependence, unspecified, uncomplicated: Secondary | ICD-10-CM

## 2014-09-17 DIAGNOSIS — I10 Essential (primary) hypertension: Secondary | ICD-10-CM

## 2014-09-17 LAB — VITAMIN D 25 HYDROXY (VIT D DEFICIENCY, FRACTURES): VIT D 25 HYDROXY: 41 ng/mL (ref 30–100)

## 2014-09-17 LAB — LIPID PANEL
CHOL/HDL RATIO: 4.3 ratio
Cholesterol: 186 mg/dL (ref 0–200)
HDL: 43 mg/dL — ABNORMAL LOW (ref >=46)
LDL Cholesterol: 110 mg/dL — ABNORMAL HIGH (ref 0–99)
Triglycerides: 163 mg/dL — ABNORMAL HIGH (ref <150)
VLDL: 33 mg/dL (ref 0–40)

## 2014-09-17 LAB — COMPREHENSIVE METABOLIC PANEL
ALT: 14 U/L (ref 0–35)
AST: 12 U/L (ref 0–37)
Albumin: 3.9 g/dL (ref 3.5–5.2)
Alkaline Phosphatase: 109 U/L (ref 39–117)
BUN: 17 mg/dL (ref 6–23)
CO2: 28 mEq/L (ref 19–32)
Calcium: 9.1 mg/dL (ref 8.4–10.5)
Chloride: 100 mEq/L (ref 96–112)
Creat: 0.6 mg/dL (ref 0.50–1.10)
Glucose, Bld: 145 mg/dL — ABNORMAL HIGH (ref 70–99)
POTASSIUM: 3.8 meq/L (ref 3.5–5.3)
Sodium: 138 mEq/L (ref 135–145)
Total Bilirubin: 0.3 mg/dL (ref 0.2–1.2)
Total Protein: 6.9 g/dL (ref 6.0–8.3)

## 2014-09-17 LAB — POCT GLYCOSYLATED HEMOGLOBIN (HGB A1C): HEMOGLOBIN A1C: 6.4

## 2014-09-17 LAB — FERRITIN: FERRITIN: 87 ng/mL (ref 10–291)

## 2014-09-17 MED ORDER — HYDROCODONE-ACETAMINOPHEN 7.5-325 MG PO TABS: 1.0000 | ORAL_TABLET | ORAL | Status: DC | PRN

## 2014-09-17 MED ORDER — METHOCARBAMOL 500 MG PO TABS: 500.0000 mg | ORAL_TABLET | Freq: Four times a day (QID) | ORAL | Status: DC | PRN

## 2014-09-17 MED ORDER — METFORMIN HCL 500 MG PO TABS: 500.0000 mg | ORAL_TABLET | Freq: Two times a day (BID) | ORAL | Status: DC

## 2014-09-17 MED ORDER — ATENOLOL-CHLORTHALIDONE 50-25 MG PO TABS: 1.0000 | ORAL_TABLET | Freq: Every day | ORAL | Status: DC

## 2014-09-17 MED ORDER — FUROSEMIDE 20 MG PO TABS: 20.0000 mg | ORAL_TABLET | Freq: Every day | ORAL | Status: DC

## 2014-09-17 NOTE — Progress Notes (Signed)
Subjective:    Patient ID: Diane Giles, female    DOB: [birth year], 59 y.o.   MRN: [id number] This chart was scribed for Diane Sorenson, MD by Diane Giles, Medical Scribe. This patient was seen in Room 27 and the patient's care was started at 10:17 AM.   HPI HPI Comments: Diane Giles is a 59 y.o. female with a history of morbid obesity, hypertension, hyperlipidemia, and prediabetes who presents to the [facility] for a follow-up.   General Health/Diet: Patient states she has been doing well overall in the past 4 months. She states she has not made any significant diet or exercise changes. Her reported diet includes chicken and vegetables; she has been trying to cut back on potatoes to less than 1/3 of the meal. She does have pool access in her backyard. Patient is still smoking and does not plan on quitting.  Right Total Hip Arthroplasty: She did have a right hip replacement surgery 2 months ago, but the recovery has not been as quick as she would like. She did finish 2 weeks of physical therapy at home, but she is still doing the home exercises daily. Patient still has some difficulty alternating legs and climbing stairs, but she has not had any problems with driving. She also cannot do a total leg lift. Sometimes, she feels as if her legs may give out as her strength has not completely returned. Patient reports taking a hydrocodone about every 3 days. She thinks the methocarbamol did help her some; she is currently out of the medication. 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  Hypertension: Patient has been measuring her blood pressure outside of the office and has been getting good results, similar to today's reading (127/76).   Vitamins/Supplements: She has been taking vitamin D and calcium supplements. She is no longer taking the iron supplement. Patient does report eating potassium in her diet  (bananas).  Past Surgical History  Procedure Laterality Date  . Lower aortic bypass  2000    per patient  . Elbow surgery Left ~2007    tendon repair by Dr. Ranell Patrick  . Eye surgery Bilateral 2008    lasik  . Total hip arthroplasty Right 07/27/2014    Procedure: RIGHT TOTAL HIP ARTHROPLASTY ANTERIOR APPROACH;  Surgeon: Durene Romans, MD;  Location: [facility];  Service: Orthopedics;  Laterality: Right;     Review of Systems  Constitutional: Positive for activity change, fatigue and unexpected weight change. Negative for fever, chills and appetite change.  Gastrointestinal: Negative for nausea, vomiting and abdominal pain.  Genitourinary: Negative for dysuria and frequency.  Musculoskeletal: Positive for myalgias, back pain, joint swelling, arthralgias and gait problem.  Skin: Negative for color change, rash and wound.  Neurological: Positive for weakness. Negative for dizziness, tremors, light-headedness and numbness.  Psychiatric/Behavioral: Positive for sleep disturbance.       Objective:  BP 127/76 mmHg  Pulse 90  Temp(Src) 98.6 F (37 C) (Oral)  Resp 16  Ht 4\' 11"  (1.499 m)  Wt 200 lb 9.6 oz (90.992 kg)  BMI 40.49 kg/m2  SpO2 95%  Physical Exam  Constitutional: She is oriented to person, place, and time. She appears well-developed and well-nourished. No distress.  HENT:  Head: Normocephalic and atraumatic.  Mouth/Throat: Oropharynx is clear and moist. No oropharyngeal exudate.  Eyes: Pupils are equal, round, and reactive to light.  Neck: Neck  supple.  Cardiovascular: Normal rate, regular rhythm and S2 normal.   Murmur heard.  Systolic murmur is present with a grade of 1/6  1/6 systolic murmur, left upper sternal border.  Pulmonary/Chest: Effort normal and breath sounds normal. No respiratory distress. She has no wheezes. She has no rales.  CTAB.  Musculoskeletal: She exhibits no edema.  Lymphadenopathy:    She has no cervical adenopathy.  Neurological: She is alert and  oriented to person, place, and time. No cranial nerve deficit.  Skin: Skin is warm and dry. No rash noted.  Psychiatric: She has a normal mood and affect. Her behavior is normal.  Vitals reviewed.         Assessment & Plan:   1. Vitamin D deficiency   2. Prediabetes   3. Hyperlipidemia LDL goal <100   4. Polypharmacy   5. Metabolic syndrome   6. Tobacco use disorder   7. Obstructive sleep apnea   8. Essential hypertension, benign   9. Osteoporosis   10. Anemia, iron deficiency     Orders Placed This Encounter  Procedures  . Lipid panel    Order Specific Question:  Has the patient fasted?    Answer:  Yes  . Comprehensive metabolic panel    Order Specific Question:  Has the patient fasted?    Answer:  Yes  . Vit D  25 hydroxy (rtn osteoporosis monitoring)  . Ferritin  . POCT glycosylated hemoglobin (Hb A1C)    Meds ordered this encounter  Medications  . methocarbamol (ROBAXIN) 500 MG tablet    Sig: Take 1 tablet (500 mg total) by mouth every 6 (six) hours as needed for muscle spasms.    Dispense:  50 tablet    Refill:  0  . furosemide (LASIX) 20 MG tablet    Sig: Take 1 tablet (20 mg total) by mouth daily.    Dispense:  90 tablet    Refill:  1  . atenolol-chlorthalidone (TENORETIC) 50-25 MG per tablet    Sig: Take 1 tablet by mouth daily.    Dispense:  90 tablet    Refill:  1  . HYDROcodone-acetaminophen (NORCO) 7.5-325 MG per tablet    Sig: Take 1-2 tablets by mouth every 4 (four) hours as needed for moderate pain.    Dispense:  100 tablet    Refill:  0  . metFORMIN (GLUCOPHAGE) 500 MG tablet    Sig: Take 1 tablet (500 mg total) by mouth 2 (two) times daily with a meal.    Dispense:  180 tablet    Refill:  1    I personally performed the services described in this documentation, which was scribed in my presence. The recorded information has been reviewed and considered, and addended by me as needed.  Diane Sorenson, MD MPH  Results for orders placed or  performed in visit on 09/17/14  Lipid panel  Result Value Ref Range   Cholesterol 186 0 - 200 mg/dL   Triglycerides 409 (H) <150 mg/dL   HDL 43 (L) >=81 mg/dL   Total CHOL/HDL Ratio 4.3 Ratio   VLDL 33 0 - 40 mg/dL   LDL Cholesterol 191 (H) 0 - 99 mg/dL  Comprehensive metabolic panel  Result Value Ref Range   Sodium 138 135 - 145 mEq/L   Potassium 3.8 3.5 - 5.3 mEq/L   Chloride 100 96 - 112 mEq/L   CO2 28 19 - 32 mEq/L   Glucose, Bld 145 (H) 70 - 99 mg/dL   BUN  17 6 - 23 mg/dL   Creat 8.29 5.62 - 1.30 mg/dL   Total Bilirubin 0.3 0.2 - 1.2 mg/dL   Alkaline Phosphatase 109 39 - 117 U/L   AST 12 0 - 37 U/L   ALT 14 0 - 35 U/L   Total Protein 6.9 6.0 - 8.3 g/dL   Albumin 3.9 3.5 - 5.2 g/dL   Calcium 9.1 8.4 - 86.5 mg/dL  Vit D  25 hydroxy (rtn osteoporosis monitoring)  Result Value Ref Range   Vit D, 25-Hydroxy 41 30 - 100 ng/mL  Ferritin  Result Value Ref Range   Ferritin 87 10 - 291 ng/mL  POCT glycosylated hemoglobin (Hb A1C)  Result Value Ref Range   Hemoglobin A1C 6.4

## 2014-09-17 NOTE — Patient Instructions (Signed)
Nicotine Addiction Nicotine can act as both a stimulant (excites/activates) and a sedative (calms/quiets). Immediately after exposure to nicotine, there is a "kick" caused in part by the drug's stimulation of the adrenal glands and resulting discharge of adrenaline (epinephrine). The rush of adrenaline stimulates the body and causes a sudden release of sugar. This means that smokers are always slightly hyperglycemic. Hyperglycemic means that the blood sugar is high, just like in diabetics. Nicotine also decreases the amount of insulin which helps control sugar levels in the body. There is an increase in blood pressure, breathing, and the rate of heart beats.  In addition, nicotine indirectly causes a release of dopamine in the brain that controls pleasure and motivation. A similar reaction is seen with other drugs of abuse, such as cocaine and heroin. This dopamine release is thought to cause the pleasurable sensations when smoking. In some different cases, nicotine can also create a calming effect, depending on sensitivity of the smoker's nervous system and the dose of nicotine taken. WHAT HAPPENS WHEN NICOTINE IS TAKEN FOR LONG PERIODS OF TIME?  Long-term use of nicotine results in addiction. It is difficult to stop.  Repeated use of nicotine creates tolerance. Higher doses of nicotine are needed to get the "kick." When nicotine use is stopped, withdrawal may last a month or more. Withdrawal may begin within a few hours after the last cigarette. Symptoms peak within the first few days and may lessen within a few weeks. For some people, however, symptoms may last for months or longer. Withdrawal symptoms include:   Irritability.  Craving.  Learning and attention deficits.  Sleep disturbances.  Increased appetite. Craving for tobacco may last for 6 months or longer. Many behaviors done while using nicotine can also play a part in the severity of withdrawal symptoms. For some people, the feel,  smell, and sight of a cigarette and the ritual of obtaining, handling, lighting, and smoking the cigarette are closely linked with the pleasure of smoking. When stopped, they also miss the related behaviors which make the withdrawal or craving worse. While nicotine gum and patches may lessen the drug aspects of withdrawal, cravings often persist. WHAT ARE THE MEDICAL CONSEQUENCES OF NICOTINE USE?  Nicotine addiction accounts for one-third of all cancers. The top cancer caused by tobacco is lung cancer. Lung cancer is the number one cancer killer of both men and women.  Smoking is also associated with cancers of the:  Mouth.  Pharynx.  Larynx.  Esophagus.  Stomach.  Pancreas.  Cervix.  Kidney.  Ureter.  Bladder.  Smoking also causes lung diseases such as lasting (chronic) bronchitis and emphysema.  It worsens asthma in adults and children.  Smoking increases the risk of heart disease, including:  Stroke.  Heart attack.  Vascular disease.  Aneurysm.  Passive or secondary smoke can also increase medical risks including:  Asthma in children.  Sudden Infant Death Syndrome (SIDS).  Additionally, dropped cigarettes are the leading cause of residential fire fatalities.  Nicotine poisoning has been reported from accidental ingestion of tobacco products by children and pets. Death usually results in a few minutes from respiratory failure (when a person stops breathing) caused by paralysis. TREATMENT   Medication. Nicotine replacement medicines such as nicotine gum and the patch are used to stop smoking. These medicines gradually lower the dosage of nicotine in the body. These medicines do not contain the carbon monoxide and other toxins found in tobacco smoke.  Hypnotherapy.  Relaxation therapy.  Nicotine Anonymous (a 12-step support   program). Find times and locations in your local yellow pages. Document Released: 12/28/2003 Document Revised: 07/16/2011 Document  Reviewed: 06/19/2013 [employer] Patient Information 2015 [employer]. This information is not intended to replace advice given to you by your health care provider. Make sure you discuss any questions you have with your health care provider.  

## 2014-11-27 ENCOUNTER — Other Ambulatory Visit: Payer: Self-pay | Admitting: Family Medicine

## 2014-12-11 ENCOUNTER — Other Ambulatory Visit: Payer: Self-pay | Admitting: Family Medicine

## 2014-12-22 ENCOUNTER — Other Ambulatory Visit: Payer: Self-pay | Admitting: Family Medicine

## 2014-12-30 ENCOUNTER — Other Ambulatory Visit: Payer: Self-pay | Admitting: Family Medicine

## 2015-01-19 NOTE — Progress Notes (Signed)
HPI    Review of Systems     Objective:   Physical Exam     Subjective:    Patient ID: Diane Giles, female    DOB: Jan 10, 1956, 59 y.o.   MRN: [id number]  Chief Complaint  Patient presents with  . Follow-up  . Hypertension  . Withdrawal    hydrocodone  . Insomnia    HPI HPI Comments: Diane Giles is a 59 y.o. female with a history of morbid obesity, hypertension, hyperlipidemia, and prediabetes who presents to the [facility] for a 4 mo follow-up of her chronic medical problems.   Metabolic syndrome/pre-DM/obesity:  Patient states she has been doing well overall in the past 4 months. She states she has not made any significant diet or exercise changes.  She does have pool access in her backyard. A1c 4 mos prior was 6.4 so rec she metformin but it was causing diarrhea initially which resolved but then just started feeling weird but unsure if the metformin was cause. She willo restarty it today   Tobacco use:   Patient is still smoking and does not plan on quitting.  Work is hugely stressful for her - she is going to retire in Feb and so will then consider it - isw looking for traveling a lot.  Right Total Hip Arthroplasty: She did have a right hip replacement surgery 6 months ago - she still has a tiny scab but is defintely much better. Patient reports taking a hydrocodone every night. She notices she can go about 300 ft rather than 10 before developing pain.  She is not longer needing the methocarbamol.  She had been taking 1 hydrocodone every night for months after her hip.  It had been bothering her that she was still needing the medicine so about a week ago she stopped it but she has been having severe w/d sxs - can push through during the day when she is busy but at night she has been really restless, jittery, moody.  She noticed that she wasn't sleeping well  Hypertension: Patient has not been measuring her blood pressure outside of the office.    Vitamins/Supplements: She has been taking vitamin D supplements but no ca or iron.  Patient does report eating potassium in her diet (bananas).  Vit D 4 mos ago was nml though she does have a h/o vit D def.  Is taking once wkly high dose vit D.  Calcium was borderline low at last OV so if persists today needs further eval.  Does not need iron supp - ferritin nml 4 mos prior despite mild post-op anemia after hip.  Taking asa and fish oil reg.  Hyperlipidemia: Lipid panel HUGELY improved at last visit with LDL 233 ->110 and T Chol 313 -> 186.  Pt is fasting this morning.  Past Medical History  Diagnosis Date  . Osteoporosis   . Sleep apnea   . Diverticulitis   . Hypertension   . Arthritis    Past Surgical History  Procedure Laterality Date  . Lower aortic bypass  2000    per patient  . Elbow surgery Left ~2007    tendon repair by Dr. Ranell Patrick  . Eye surgery Bilateral 2008    lasik  . Total hip arthroplasty Right 07/27/2014    Procedure: RIGHT TOTAL HIP ARTHROPLASTY ANTERIOR APPROACH;  Surgeon: Durene Romans, MD;  Location: [facility];  Service: Orthopedics;  Laterality: Right;   Current Outpatient Prescriptions on File Prior to  Visit  Medication Sig Dispense Refill  . metFORMIN (GLUCOPHAGE) 500 MG tablet Take 1 tablet (500 mg total) by mouth 2 (two) times daily with a meal. 180 tablet 1  . Omega-3 Fatty Acids (OMEGA 3 PO) Take 1 capsule by mouth 2 (two) times daily.    . pravastatin (PRAVACHOL) 80 MG tablet TAKE ONE TABLET AT BEDTIME. 30 tablet 2   No current facility-administered medications on file prior to visit.   Allergies  Allergen Reactions  . Codeine Itching   Family History  Problem Relation Age of Onset  . Cancer Father    Social History   Social History  . Marital Status: Married    Spouse Name: N/A  . Number of Children: N/A  . Years of Education: N/A   Occupational History  . Tree surgeon    Social History Main Topics  . Smoking status: Current Every Day  Smoker -- 0.50 packs/day for 40 years    Types: Cigarettes  . Smokeless tobacco: Never Used  . Alcohol Use: Yes     Comment: very rare  . Drug Use: No  . Sexual Activity:    Partners: Male   Other Topics Concern  . None   Social History Narrative   Married. Patient does not exercise. She has a Geographical information systems officer.     Review of Systems  Constitutional: Positive for activity change, fatigue and unexpected weight change. Negative for fever, chills and appetite change.  Gastrointestinal: Negative for nausea, vomiting and abdominal pain.  Genitourinary: Negative for dysuria and frequency.  Musculoskeletal: Positive for myalgias, back pain, joint swelling, arthralgias and gait problem.  Skin: Negative for color change, rash and wound.  Neurological: Positive for weakness. Negative for dizziness, tremors, light-headedness and numbness.  Psychiatric/Behavioral: Positive for sleep disturbance.       Objective:  BP 108/71 mmHg  Pulse 67  Temp(Src) 98.2 F (36.8 C)  Resp 16  Ht 4\' 11"  (1.499 m)  Wt 196 lb (88.905 kg)  BMI 39.57 kg/m2   Physical Exam  Constitutional: She is oriented to person, place, and time. She appears well-developed and well-nourished. No distress.  HENT:  Head: Normocephalic and atraumatic.  Mouth/Throat: Oropharynx is clear and moist. No oropharyngeal exudate.  Eyes: Pupils are equal, round, and reactive to light.  Neck: Neck supple.  Cardiovascular: Normal rate, regular rhythm and S2 normal.   Murmur heard.  Systolic murmur is present with a grade of 1/6  1/6 systolic murmur, left upper sternal border.  Pulmonary/Chest: Effort normal and breath sounds normal. No respiratory distress. She has no wheezes. She has no rales.  CTAB.  Musculoskeletal: She exhibits no edema.  Lymphadenopathy:    She has no cervical adenopathy.  Neurological: She is alert and oriented to person, place, and time. No cranial nerve deficit.  Skin: Skin is warm and dry. No rash  noted.  Psychiatric: She has a normal mood and affect. Her behavior is normal.  Vitals reviewed.         Assessment & Plan:  sched CPE for next visit after 05/15/15. Zostavax rx given today.     1. Essential hypertension, benign   2. Obstructive sleep apnea   3. Prediabetes - a1c up to 6.5 today.  Refill metformin whenever pharmacy req - pt has plenty at home  4. Osteoporosis   5. Trochanteric bursitis of right hip   6. Morbid obesity   7. Tobacco use disorder - rec low dose screening lung CT  8. Hyperlipidemia - LDL  sig improved on pravastatin 80 but still not at goal now that a1c has reached DM range of 6.8 so change to atorvastatin 40.  9. Metabolic syndrome   10. Anemia, iron deficiency   11. Screening for STD (sexually transmitted disease)   12. Need for shingles vaccine   13. Vitamin D deficiency - Can stop wkly vit D, switch to otc    Orders Placed This Encounter  Procedures  . Lipid panel    Order Specific Question:  Has the patient fasted?    Answer:  Yes  . CBC  . Comprehensive metabolic panel    Order Specific Question:  Has the patient fasted?    Answer:  Yes  . Ferritin  . Hepatitis C antibody  . HIV antibody  . Vit D  25 hydroxy (rtn osteoporosis monitoring)  . POCT glycosylated hemoglobin (Hb A1C)    Meds ordered this encounter  Medications  . zoster vaccine live, PF, (ZOSTAVAX) 24401 UNT/0.65ML injection    Sig: Inject 19,400 Units into the skin once.    Dispense:  1 each    Refill:  0  . traZODone (DESYREL) 50 MG tablet    Sig: Take 0.5-1 tablets (25-50 mg total) by mouth at bedtime as needed for sleep.    Dispense:  30 tablet    Refill:  0  . furosemide (LASIX) 20 MG tablet    Sig: Take 1 tablet (20 mg total) by mouth daily.    Dispense:  90 tablet    Refill:  1  . atenolol-chlorthalidone (TENORETIC) 50-25 MG per tablet    Sig: Take 1 tablet by mouth daily.    Dispense:  90 tablet    Refill:  3  . blood glucose meter kit and supplies     Sig: Dispense based on patient and insurance preference. Use as directed once a day. (DX: R73.9)    Dispense:  1 each    Refill:  0    Order Specific Question:  Number of strips    Answer:  18    Order Specific Question:  Number of lancets    Answer:  100  . Lancets MISC    Sig: Use as directed once a day. (DX: R73.9)    Dispense:  100 each    Refill:  2  . glucose blood (GLUCOSE METER TEST) test strip    Sig: Use as instructed once a day. (DX:R73.9)    Dispense:  50 each    Refill:  2     Norberto Sorenson, MD MPH  Results for orders placed or performed in visit on 01/20/15  Lipid panel  Result Value Ref Range   Cholesterol 189 125 - 200 mg/dL   Triglycerides 027 (H) <150 mg/dL   HDL 39 (L) >=25 mg/dL   Total CHOL/HDL Ratio 4.8 <=5.0 Ratio   VLDL 36 (H) <30 mg/dL   LDL Cholesterol 366 <440 mg/dL  CBC  Result Value Ref Range   WBC 9.0 4.0 - 10.5 K/uL   RBC 4.29 3.87 - 5.11 MIL/uL   Hemoglobin 13.8 12.0 - 15.0 g/dL   HCT 34.7 42.5 - 95.6 %   MCV 88.3 78.0 - 100.0 fL   MCH 32.2 26.0 - 34.0 pg   MCHC 36.4 (H) 30.0 - 36.0 g/dL   RDW 38.7 56.4 - 33.2 %   Platelets 403 (H) 150 - 400 K/uL   MPV 9.7 8.6 - 12.4 fL  Comprehensive metabolic panel  Result Value Ref Range  Sodium 138 135 - 146 mmol/L   Potassium 3.5 3.5 - 5.3 mmol/L   Chloride 103 98 - 110 mmol/L   CO2 28 20 - 31 mmol/L   Glucose, Bld 131 (H) 65 - 99 mg/dL   BUN 17 7 - 25 mg/dL   Creat 1.61 0.96 - 0.45 mg/dL   Total Bilirubin 0.4 0.2 - 1.2 mg/dL   Alkaline Phosphatase 93 33 - 130 U/L   AST 11 10 - 35 U/L   ALT 11 6 - 29 U/L   Total Protein 6.5 6.1 - 8.1 g/dL   Albumin 3.9 3.6 - 5.1 g/dL   Calcium 40.9 8.6 - 81.1 mg/dL  Ferritin  Result Value Ref Range   Ferritin 83 10 - 291 ng/mL  Hepatitis C antibody  Result Value Ref Range   HCV Ab NEGATIVE NEGATIVE  HIV antibody  Result Value Ref Range   HIV 1&2 Ab, 4th Generation NONREACTIVE NONREACTIVE  Vit D  25 hydroxy (rtn osteoporosis monitoring)  Result Value  Ref Range   Vit D, 25-Hydroxy 50 30 - 100 ng/mL  POCT glycosylated hemoglobin (Hb A1C)  Result Value Ref Range   Hemoglobin A1C 6.5

## 2015-01-19 NOTE — Patient Instructions (Signed)
[facility] is now offering annual lung cancer screening by low-dose CT scan.  This is covered for qualifying patients and your insurance will be checked before the procedure.  Call the lung cancer screening nurse navigators at [phone] to learn more about this and get scheduled.  

## 2015-01-20 ENCOUNTER — Encounter: Payer: Self-pay | Admitting: Family Medicine

## 2015-01-20 ENCOUNTER — Ambulatory Visit (INDEPENDENT_AMBULATORY_CARE_PROVIDER_SITE_OTHER): Payer: 59 | Admitting: Family Medicine

## 2015-01-20 VITALS — BP 108/71 | HR 67 | Temp 98.2°F | Resp 16 | Ht 59.0 in | Wt 196.0 lb

## 2015-01-20 DIAGNOSIS — I10 Essential (primary) hypertension: Secondary | ICD-10-CM

## 2015-01-20 DIAGNOSIS — M7061 Trochanteric bursitis, right hip: Secondary | ICD-10-CM | POA: Diagnosis not present

## 2015-01-20 DIAGNOSIS — D509 Iron deficiency anemia, unspecified: Secondary | ICD-10-CM | POA: Diagnosis not present

## 2015-01-20 DIAGNOSIS — M810 Age-related osteoporosis without current pathological fracture: Secondary | ICD-10-CM | POA: Diagnosis not present

## 2015-01-20 DIAGNOSIS — R7309 Other abnormal glucose: Secondary | ICD-10-CM | POA: Diagnosis not present

## 2015-01-20 DIAGNOSIS — G4733 Obstructive sleep apnea (adult) (pediatric): Secondary | ICD-10-CM

## 2015-01-20 DIAGNOSIS — R7303 Prediabetes: Secondary | ICD-10-CM

## 2015-01-20 DIAGNOSIS — Z720 Tobacco use: Secondary | ICD-10-CM

## 2015-01-20 DIAGNOSIS — E785 Hyperlipidemia, unspecified: Secondary | ICD-10-CM

## 2015-01-20 DIAGNOSIS — Z23 Encounter for immunization: Secondary | ICD-10-CM

## 2015-01-20 DIAGNOSIS — Z113 Encounter for screening for infections with a predominantly sexual mode of transmission: Secondary | ICD-10-CM | POA: Diagnosis not present

## 2015-01-20 DIAGNOSIS — E8881 Metabolic syndrome: Secondary | ICD-10-CM

## 2015-01-20 DIAGNOSIS — E559 Vitamin D deficiency, unspecified: Secondary | ICD-10-CM

## 2015-01-20 DIAGNOSIS — F17200 Nicotine dependence, unspecified, uncomplicated: Secondary | ICD-10-CM

## 2015-01-20 LAB — LIPID PANEL
Cholesterol: 189 mg/dL (ref 125–200)
HDL: 39 mg/dL — AB (ref >=46)
LDL CALC: 114 mg/dL (ref <130)
TRIGLYCERIDES: 181 mg/dL — AB (ref <150)
Total CHOL/HDL Ratio: 4.8 Ratio (ref <=5.0)
VLDL: 36 mg/dL — ABNORMAL HIGH (ref <30)

## 2015-01-20 LAB — HIV ANTIBODY (ROUTINE TESTING W REFLEX): HIV: NONREACTIVE

## 2015-01-20 LAB — CBC
HEMATOCRIT: 37.9 % (ref 36.0–46.0)
Hemoglobin: 13.8 g/dL (ref 12.0–15.0)
MCH: 32.2 pg (ref 26.0–34.0)
MCHC: 36.4 g/dL — ABNORMAL HIGH (ref 30.0–36.0)
MCV: 88.3 fL (ref 78.0–100.0)
MPV: 9.7 fL (ref 8.6–12.4)
PLATELETS: 403 10*3/uL — AB (ref 150–400)
RBC: 4.29 MIL/uL (ref 3.87–5.11)
RDW: 13.8 % (ref 11.5–15.5)
WBC: 9 10*3/uL (ref 4.0–10.5)

## 2015-01-20 LAB — COMPREHENSIVE METABOLIC PANEL
ALT: 11 U/L (ref 6–29)
AST: 11 U/L (ref 10–35)
Albumin: 3.9 g/dL (ref 3.6–5.1)
Alkaline Phosphatase: 93 U/L (ref 33–130)
BUN: 17 mg/dL (ref 7–25)
CALCIUM: 10 mg/dL (ref 8.6–10.4)
CHLORIDE: 103 mmol/L (ref 98–110)
CO2: 28 mmol/L (ref 20–31)
Creat: 0.69 mg/dL (ref 0.50–0.99)
Glucose, Bld: 131 mg/dL — ABNORMAL HIGH (ref 65–99)
POTASSIUM: 3.5 mmol/L (ref 3.5–5.3)
Sodium: 138 mmol/L (ref 135–146)
TOTAL PROTEIN: 6.5 g/dL (ref 6.1–8.1)
Total Bilirubin: 0.4 mg/dL (ref 0.2–1.2)

## 2015-01-20 LAB — HEPATITIS C ANTIBODY: HCV AB: NEGATIVE

## 2015-01-20 LAB — FERRITIN: Ferritin: 83 ng/mL (ref 10–291)

## 2015-01-20 LAB — POCT GLYCOSYLATED HEMOGLOBIN (HGB A1C): Hemoglobin A1C: 6.5

## 2015-01-20 MED ORDER — BLOOD GLUCOSE METER KIT: PACK | Status: DC

## 2015-01-20 MED ORDER — GLUCOSE BLOOD VI STRP: ORAL_STRIP | Status: DC

## 2015-01-20 MED ORDER — FUROSEMIDE 20 MG PO TABS: 20.0000 mg | ORAL_TABLET | Freq: Every day | ORAL | Status: DC

## 2015-01-20 MED ORDER — TRAZODONE HCL 50 MG PO TABS: 25.0000 mg | ORAL_TABLET | Freq: Every evening | ORAL | Status: DC | PRN

## 2015-01-20 MED ORDER — LANCETS MISC: Status: DC

## 2015-01-20 MED ORDER — ATENOLOL-CHLORTHALIDONE 50-25 MG PO TABS: 1.0000 | ORAL_TABLET | Freq: Every day | ORAL | Status: DC

## 2015-01-20 MED ORDER — ZOSTER VACCINE LIVE 19400 UNT/0.65ML ~~LOC~~ SOLR: 0.6500 mL | Freq: Once | SUBCUTANEOUS | Status: DC

## 2015-01-21 ENCOUNTER — Ambulatory Visit: Payer: 59 | Admitting: Family Medicine

## 2015-01-21 LAB — VITAMIN D 25 HYDROXY (VIT D DEFICIENCY, FRACTURES): Vit D, 25-Hydroxy: 50 ng/mL (ref 30–100)

## 2015-02-02 MED ORDER — ATORVASTATIN CALCIUM 40 MG PO TABS: 40.0000 mg | ORAL_TABLET | Freq: Every day | ORAL | Status: DC

## 2015-06-15 NOTE — Patient Instructions (Addendum)
Diane Giles is now offering annual lung cancer screening by low-dose CT scan.  This is covered for qualifying patients and your insurance will be checked before the procedure.  Call the lung cancer screening nurse navigators at [phone] to learn more about this and get scheduled.  Call [facility] to get your mammogram and DEXA bone scan set up - you are due!  Call [facility] to schedule an appointment. [facility] [address] [place], [place] [address] [phone] Dr. Quincy Simmonds, Essie Christine - all great women gynecologists!  Get an eye exam this year.  If you want to go to the podiatrist to see about custom shoes or orthotics. See a nutritionist. - these are all things that your insurance will usually cover when you have diabetes and I know you are going to get rid of the DM so max out your benefits while you can and then get rid of the diabetes.  I"m sure retirement will help.\  Blood Glucose Monitoring, Adult Monitoring your blood glucose (also know as blood sugar) helps you to manage your diabetes. It also helps you and your health care provider monitor your diabetes and determine how well your treatment plan is working. WHY SHOULD YOU MONITOR YOUR BLOOD GLUCOSE?  It can help you understand how food, exercise, and medicine affect your blood glucose.  It allows you to know what your blood glucose is at any given moment. You can quickly tell if you are having low blood glucose (hypoglycemia) or high blood glucose (hyperglycemia).  It can help you and your health care provider know how to adjust your medicines.  It can help you understand how to manage an illness or adjust medicine for exercise. WHEN SHOULD YOU TEST? Your health care provider will help you decide how often you should check your blood glucose. This may depend on the type of diabetes you have, your diabetes control, or the types of medicines you are taking. Be sure to write  down all of your blood glucose readings so that this information can be reviewed with your health care provider. See below for examples of testing times that your health care provider may suggest. Type 1 Diabetes  Test at least 2 times per day if your diabetes is well controlled, if you are using an insulin pump, or if you perform multiple daily injections.  If your diabetes is not well controlled or if you are sick, you may need to test more often.  It is a good idea to also test:  Before every insulin injection.  Before and after exercise.  Between meals and 2 hours after a meal.  Occasionally between 2:00 a.m. and 3:00 a.m. Type 2 Diabetes  If you are taking insulin, test at least 2 times per day. However, it is best to test before every insulin injection.  If you take medicines by mouth (orally), test 2 times a day.  If you are on a controlled diet, test once a day.  If your diabetes is not well controlled or if you are sick, you may need to monitor more often. HOW TO MONITOR YOUR BLOOD GLUCOSE Supplies Needed  Blood glucose meter.  Test strips for your meter. Each meter has its own strips. You must use the strips that go with your own meter.  A pricking needle (lancet).  A device that holds the lancet (lancing device).  A journal or log book to write down your results. Procedure  Wash your hands  with soap and water. Alcohol is not preferred.  Prick the side of your finger (not the tip) with the lancet.  Gently milk the finger until a small drop of blood appears.  Follow the instructions that come with your meter for inserting the test strip, applying blood to the strip, and using your blood glucose meter. Other Areas to Get Blood for Testing Some meters allow you to use other areas of your body (other than your finger) to test your blood. These areas are called alternative sites. The most common alternative sites are:  The forearm.  The thigh.  The back area  of the lower leg.  The palm of the hand. The blood flow in these areas is slower. Therefore, the blood glucose values you get may be delayed, and the numbers are different from what you would get from your fingers. Do not use alternative sites if you think you are having hypoglycemia. Your reading will not be accurate. Always use a finger if you are having hypoglycemia. Also, if you cannot feel your lows (hypoglycemia unawareness), always use your fingers for your blood glucose checks. ADDITIONAL TIPS FOR GLUCOSE MONITORING  Do not reuse lancets.  Always carry your supplies with you.  All blood glucose meters have a 24-hour "hotline" number to call if you have questions or need help.  Adjust (calibrate) your blood glucose meter with a control solution after finishing a few boxes of strips. BLOOD GLUCOSE RECORD KEEPING It is a good idea to keep a daily record or log of your blood glucose readings. Most glucose meters, if not all, keep your glucose records stored in the meter. Some meters come with the ability to download your records to your home computer. Keeping a record of your blood glucose readings is especially helpful if you are wanting to look for patterns. Make notes to go along with the blood glucose readings because you might forget what happened at that exact time. Keeping good records helps you and your health care provider to work together to achieve good diabetes management.    This information is not intended to replace advice given to you by your health care provider. Make sure you discuss any questions you have with your health care provider.   Document Released: 04/26/2003 Document Revised: 05/14/2014 Document Reviewed: 09/15/2012 Elsevier Interactive Patient Education 2016 [employer].   Diabetes Mellitus and Food It is important for you to manage your blood sugar (glucose) level. Your blood glucose level can be greatly affected by what you eat. Eating healthier foods in  the appropriate amounts throughout the day at about the same time each day will help you control your blood glucose level. It can also help slow or prevent worsening of your diabetes mellitus. Healthy eating may even help you improve the level of your blood pressure and reach or maintain a healthy weight.  General recommendations for healthful eating and cooking habits include:  Eating meals and snacks regularly. Avoid going long periods of time without eating to lose weight.  Eating a diet that consists mainly of plant-based foods, such as fruits, vegetables, nuts, legumes, and whole grains.  Using low-heat cooking methods, such as baking, instead of high-heat cooking methods, such as deep frying. Work with your dietitian to make sure you understand how to use the Nutrition Facts information on food labels. HOW CAN FOOD AFFECT ME? Carbohydrates Carbohydrates affect your blood glucose level more than any other type of food. Your dietitian will help you determine how many  carbohydrates to eat at each meal and teach you how to count carbohydrates. Counting carbohydrates is important to keep your blood glucose at a healthy level, especially if you are using insulin or taking certain medicines for diabetes mellitus. Alcohol Alcohol can cause sudden decreases in blood glucose (hypoglycemia), especially if you use insulin or take certain medicines for diabetes mellitus. Hypoglycemia can be a life-threatening condition. Symptoms of hypoglycemia (sleepiness, dizziness, and disorientation) are similar to symptoms of having too much alcohol.  If your health care provider has given you approval to drink alcohol, do so in moderation and use the following guidelines:  Women should not have more than one drink per day, and men should not have more than two drinks per day. One drink is equal to:  12 oz of beer.  5 oz of wine.  1 oz of hard liquor.  Do not drink on an empty stomach.  Keep yourself  hydrated. Have water, diet soda, or unsweetened iced tea.  Regular soda, juice, and other mixers might contain a lot of carbohydrates and should be counted. WHAT FOODS ARE NOT RECOMMENDED? As you make food choices, it is important to remember that all foods are not the same. Some foods have fewer nutrients per serving than other foods, even though they might have the same number of calories or carbohydrates. It is difficult to get your body what it needs when you eat foods with fewer nutrients. Examples of foods that you should avoid that are high in calories and carbohydrates but low in nutrients include:  Trans fats (most processed foods list trans fats on the Nutrition Facts label).  Regular soda.  Juice.  Candy.  Sweets, such as cake, pie, doughnuts, and cookies.  Fried foods. WHAT FOODS CAN I EAT? Eat nutrient-rich foods, which will nourish your body and keep you healthy. The food you should eat also will depend on several factors, including:  The calories you need.  The medicines you take.  Your weight.  Your blood glucose level.  Your blood pressure level.  Your cholesterol level. You should eat a variety of foods, including:  Protein.  Lean cuts of meat.  Proteins low in saturated fats, such as fish, egg whites, and beans. Avoid processed meats.  Fruits and vegetables.  Fruits and vegetables that may help control blood glucose levels, such as apples, mangoes, and yams.  Dairy products.  Choose fat-free or low-fat dairy products, such as milk, yogurt, and cheese.  Grains, bread, pasta, and rice.  Choose whole grain products, such as multigrain bread, whole oats, and brown rice. These foods may help control blood pressure.  Fats.  Foods containing healthful fats, such as nuts, avocado, olive oil, canola oil, and fish. DOES EVERYONE WITH DIABETES MELLITUS HAVE THE SAME MEAL PLAN? Because every person with diabetes mellitus is different, there is not one  meal plan that works for everyone. It is very important that you meet with a dietitian who will help you create a meal plan that is just right for you.   This information is not intended to replace advice given to you by your health care provider. Make sure you discuss any questions you have with your health care provider.   Document Released: 01/18/2005 Document Revised: 05/14/2014 Document Reviewed: 03/20/2013 Elsevier Interactive Patient Education 2016 [employer].   Complementary and Alternative Medical Therapies for Diabetes Complementary and alternative medicines are health care practices or products that are not always accepted as part of routine medicine. Complementary medicine  is used along with routine medicine (medical therapy). Alternative medicine can sometimes be used instead of routine medicine. Some people use these methods to treat diabetes. While some of these therapies may be effective, others may not be. Some may even be harmful. Patients using these methods need to tell their caregiver. It is important to let your caregivers know what you are doing. Some of these therapies are discussed below. For more information, talk with your caregiver. THERAPIES Acupuncture Acupuncture is done by a professional who inserts needles into certain points on the skin. Some scientists believe that this triggers the release of the body's natural painkillers. It has been shown to relieve long-term (chronic) pain. This may help patients with painful nerve damage caused by diabetes. Biofeedback Biofeedback helps a person become more aware of the body's response to pain. It also helps you learn to deal with the pain. This alternative therapy focuses on relaxation and stress-reduction techniques. Thinking of peaceful mental images (guided imagery) is one technique. Some people believe these images can ease their condition. MEDICATIONS Chromium Several studies report that chromium supplements may  improve diabetes control. Chromium helps insulin improve its action. Research is not yet certain. Supplements have not been recommended or approved. Caution is needed if you have kidney (renal) problems. Ginseng There are several types of ginseng plants. American ginseng is used for diabetes studies. Those studies have shown some glucose-lowering effects. Those effects have been seen with fasting and after-meal blood glucose levels. They have also been seen in A1c levels (average blood glucose levels over a 80-month period). More long-term studies are needed before recommendations for use of ginseng can be made. Magnesium Experts have studied the relationship between magnesium and diabetes for many years. But it is not yet fully understood. Studies suggest that a low amount of magnesium may make blood glucose control worse in type 2 diabetes. Research also shows that a low amount may contribute to certain diabetes complications. One study showed that people who consume more magnesium had less risk of type 2 diabetes. Eating whole grains, nuts, and green leafy vegetables raises the magnesium level. Vanadium Vanadium is a compound found in tiny amounts in plants and animals. Early studies showed that vanadium improved blood glucose levels in animals with type 1 and type 2 diabetes. One study found that when given vanadium, those with diabetes were able to decrease their insulin dosage. Researchers still need to learn how it works in the body to discover any side effects, and to find safe dosages. Cinnamon There have been a couple of studies that seem to indicate cinnamon decreases insulin resistance and increases insulin production. By doing so, it may lower blood glucose. Exact doses are unknown, but it may work best when used in combination with other diabetes medicines.   This information is not intended to replace advice given to you by your health care provider. Make sure you discuss any questions you  have with your health care provider.   Document Released: 02/18/2007 Document Revised: 07/16/2011 Document Reviewed: 03/03/2009 Elsevier Interactive Patient Education [employer].

## 2015-06-15 NOTE — Progress Notes (Signed)
Subjective:    Patient ID: Diane Giles, female    DOB: Jun 30, 1955, 60 y.o.   MRN: [id number] Chief Complaint  Patient presents with  . Annual Exam    pt stated she didnt need a pap until she was 60 yr old    HPI  Diane Giles is a delightful 60 yo woman here today for her full physical.  I last saw pt 5 mos prior and her last CPE was with me 1 year prior.  She did have a Right total hip replacement this past year.  Hip is doing better.  HTN: on atenolol, chlorthalidone 25 and taking lasix every day. No myalgias and gets potassium in her diet.  HPL: Switched from pravastatin 80 to atorvastatin 40 qhs at last visit 5 mos prior in effort of getting to goal LDL <100. Taking fish oil bid and a baby asa 81 mg.  Tob use: 1/2 ppd x 40 yrs, wants to quit - maybe after retirement this yr? Pt was given screening lung CT info at last visit.   Metabolic syndrome/obesity/pre-DM:  a1c was 6.4 so she tried metformin but had side effects and stopped so a1c increased to 6.5 at last visit 5 mos prior so pt did not restart metformin and has not made any  Diet changes.  Osteoporosis with vitamin D def: severe osteopenia on DEXA 12/2012 but improved since prior in 2009 so working on ca/vit D and weightbearing. Pt did treat vitamin D def with high dose and last checked was nml at 50 so switched back to otc.  Is still taking one otc tab daily but doesn't no quantity. Not taking calcium as she is drinking at least 3 glasses of milk each day - skin to 2%.   OSA: On cpap regularly  Preventative health: Pap - no h/o abnml. PaP and HR HPV neg 05/2014 so repeat at 60 yo and if normal no additional indicated. Last yr had some tenderness on pelvic exam so obtained US which showed a thickened endometrium and recommended endometrial biopsy so pt was referred to gyn but pt never scheduled.  She was unable to contact them . SHe would like to make an appointment for the gynecologist again. Colonoscopy in 2014 - repeat in  3 yrs - done by Dr. Elnoria Howard, pt does not have an appointment but will schedule. tdap was 2012, declines flu,  Gave shingles vaccine rx at last visit  - pt did not have done. Neg HIV and Hep C 4 mos prior EKG: 07/19/14 Going to retire in 2 wks then go on trip to [place] - 5 kids - 3 in [place] in in [place] but being reassigned to [place], 1 in [place] where she grew up but hasn't been back much since 2003 when her dada passed - painful.   Visual Acuity Screening   Right eye Left eye Both eyes  Without correction: 20/40 20/40 20/40   With correction:      Past Medical History  Diagnosis Date  . Osteoporosis   . Sleep apnea   . Diverticulitis   . Hypertension   . Arthritis    Past Surgical History  Procedure Laterality Date  . Lower aortic bypass  2000    per patient  . Elbow surgery Left ~2007    tendon repair by Dr. Ranell Patrick  . Eye surgery Bilateral 2008    lasik  . Total hip arthroplasty Right 07/27/2014    Procedure: RIGHT TOTAL HIP ARTHROPLASTY ANTERIOR APPROACH;  Surgeon: Molli Hazard  Charlann Boxer, MD;  Location: [facility];  Service: Orthopedics;  Laterality: Right;   Current Outpatient Prescriptions on File Prior to Visit  Medication Sig Dispense Refill  . atenolol-chlorthalidone (TENORETIC) 50-25 MG per tablet Take 1 tablet by mouth daily. 90 tablet 3  . blood glucose meter kit and supplies Dispense based on patient and insurance preference. Use as directed once a day. (DX: R73.9) 1 each 0  . glucose blood (GLUCOSE METER TEST) test strip Use as instructed once a day. (DX:R73.9) 50 each 2  . Lancets MISC Use as directed once a day. (DX: R73.9) 100 each 2  . Omega-3 Fatty Acids (OMEGA 3 PO) Take 1 capsule by mouth 2 (two) times daily.     No current facility-administered medications on file prior to visit.   Allergies  Allergen Reactions  . Codeine Itching   Family History  Problem Relation Age of Onset  . Cancer Father    Social History   Social History  . Marital Status: Married    Spouse  Name: N/A  . Number of Children: N/A  . Years of Education: N/A   Occupational History  . Tree surgeon    Social History Main Topics  . Smoking status: Current Every Day Smoker -- 0.50 packs/day for 40 years    Types: Cigarettes  . Smokeless tobacco: Never Used  . Alcohol Use: Yes     Comment: very rare  . Drug Use: No  . Sexual Activity:    Partners: Male   Other Topics Concern  . None   Social History Narrative   Married. Patient does not exercise. She has a Geographical information systems officer.    Review of Systems  All other systems reviewed and are negative.      Objective:  BP 105/70 mmHg  Pulse 74  Temp(Src) 97.9 F (36.6 C)  Resp 16  Ht 4\' 11"  (1.499 m)  Wt 199 lb (90.266 kg)  BMI 40.17 kg/m2  Physical Exam  Constitutional: She is oriented to person, place, and time. She appears well-developed and well-nourished. No distress.  HENT:  Head: Normocephalic and atraumatic.  Right Ear: Tympanic membrane, external ear and ear canal normal.  Left Ear: Tympanic membrane, external ear and ear canal normal.  Nose: Nose normal. No mucosal edema or rhinorrhea.  Mouth/Throat: Uvula is midline, oropharynx is clear and moist and mucous membranes are normal. No posterior oropharyngeal erythema.  Eyes: Conjunctivae and EOM are normal. Pupils are equal, round, and reactive to light. Right eye exhibits no discharge. Left eye exhibits no discharge. No scleral icterus.  Neck: Normal range of motion. Neck supple. No thyromegaly present.  Cardiovascular: Normal rate, regular rhythm, normal heart sounds and intact distal pulses.   Pulmonary/Chest: Effort normal and breath sounds normal. No respiratory distress.  Abdominal: Soft. Bowel sounds are normal. There is no tenderness.  Genitourinary: No breast swelling, tenderness, discharge or bleeding.  Musculoskeletal: She exhibits no edema.  Lymphadenopathy:    She has no cervical adenopathy.  Neurological: She is alert and oriented to person,  place, and time. She has normal reflexes.  Skin: Skin is warm and dry. She is not diaphoretic. No erythema.  Psychiatric: She has a normal mood and affect. Her behavior is normal.          Assessment & Plan:  cmp, lipid, ua, a1c, tsh (no cbc as was nml 4 mos prior) Due for repeat dexa after 12/2014 I'm not sure pt will qualify for lung cancer screening CT but advised to  call to check - gave # Reminded to get shingles vaccine Thickened endometrium on pelvic US last yr, referred to gyn for sampling but pt did not go. Repeat pelvic and just pap this yr as well as bimanual - if nml could repeat US vs gyn referral - if abnml exam -> gyn referral. Due for colonoscopy   DEXA, colnosocpy, pap/pelvic, gyn, zostavax, lung CT Pt will sched for dexa and mamm\o - done at [facility] - pt will sched Print shingles vaccine  1. Annual physical exam   2. Screening for cardiovascular, respiratory, and genitourinary diseases   3. Screening for breast cancer   4. Screening for cervical cancer   5. Routine screening for STI (sexually transmitted infection)   6. Screening for deficiency anemia   7. Screening for thyroid disorder   8. Tobacco abuse   9. Type 2 diabetes mellitus without complication, without long-term current use of insulin (HCC)   10. Osteoporosis   11. Screening for colorectal cancer   12. Metabolic syndrome   13. Hyperlipidemia - refilled lipitor though LDL still slightly above goal of <100  14. Thickened endometrium   15. Essential hypertension, benign   16. Need for shingles vaccine     Orders Placed This Encounter  Procedures  . Comprehensive metabolic panel    Order Specific Question:  Has the patient fasted?    Answer:  Yes  . Lipid panel    Order Specific Question:  Has the patient fasted?    Answer:  Yes  . TSH  . Microalbumin/Creatinine Ratio, Urine  . Ambulatory referral to diabetic education    Referral Priority:  Routine    Referral Type:  Consultation    Referral  Reason:  Specialty Services Required    Number of Visits Requested:  1  . Ambulatory referral to Gynecology    Referral Priority:  Routine    Referral Type:  Consultation    Referral Reason:  Specialty Services Required    Requested Specialty:  Gynecology    Number of Visits Requested:  1  . Ambulatory referral to Ophthalmology    Referral Priority:  Routine    Referral Type:  Consultation    Referral Reason:  Specialty Services Required    Requested Specialty:  Ophthalmology    Number of Visits Requested:  1  . POCT urinalysis dipstick  . POCT glycosylated hemoglobin (Hb A1C)    Meds ordered this encounter  Medications  . zoster vaccine live, PF, (ZOSTAVAX) 41324 UNT/0.65ML injection    Sig: Inject 19,400 Units into the skin once.    Dispense:  1 each    Refill:  0  . metFORMIN (GLUCOPHAGE) 500 MG tablet    Sig: Take 1 tablet (500 mg total) by mouth 2 (two) times daily with a meal.    Dispense:  180 tablet    Refill:  1  . furosemide (LASIX) 20 MG tablet    Sig: Take 1 tablet (20 mg total) by mouth daily.    Dispense:  90 tablet    Refill:  1  . atorvastatin (LIPITOR) 40 MG tablet    Sig: Take 1 tablet (40 mg total) by mouth daily at 6 PM.    Dispense:  90 tablet    Refill:  1     Norberto Sorenson, MD MPH  Results for orders placed or performed in visit on 06/16/15  Comprehensive metabolic panel  Result Value Ref Range   Sodium 139 135 - 146 mmol/L   Potassium  3.3 (L) 3.5 - 5.3 mmol/L   Chloride 100 98 - 110 mmol/L   CO2 25 20 - 31 mmol/L   Glucose, Bld 154 (H) 65 - 99 mg/dL   BUN 16 7 - 25 mg/dL   Creat 1.61 0.96 - 0.45 mg/dL   Total Bilirubin 0.5 0.2 - 1.2 mg/dL   Alkaline Phosphatase 137 (H) 33 - 130 U/L   AST 12 10 - 35 U/L   ALT 16 6 - 29 U/L   Total Protein 6.5 6.1 - 8.1 g/dL   Albumin 3.8 3.6 - 5.1 g/dL   Calcium 9.1 8.6 - 40.9 mg/dL  Lipid panel  Result Value Ref Range   Cholesterol 186 125 - 200 mg/dL   Triglycerides 811 (H) <150 mg/dL   HDL 34 (L) >=91  mg/dL   Total CHOL/HDL Ratio 5.5 (H) <=5.0 Ratio   VLDL 43 (H) <30 mg/dL   LDL Cholesterol 478 <295 mg/dL  TSH  Result Value Ref Range   TSH 2.10 mIU/L  Microalbumin/Creatinine Ratio, Urine  Result Value Ref Range   Creatinine, Urine 10 (L) 20 - 320 mg/dL   Microalb, Ur 2.9 Not estab mg/dL   Microalb Creat Ratio 290 (H) <30 mcg/mg creat  POCT glycosylated hemoglobin (Hb A1C)  Result Value Ref Range   Hemoglobin A1C 7.0

## 2015-06-16 ENCOUNTER — Encounter: Payer: Self-pay | Admitting: Family Medicine

## 2015-06-16 ENCOUNTER — Ambulatory Visit (INDEPENDENT_AMBULATORY_CARE_PROVIDER_SITE_OTHER): Payer: 59 | Admitting: Family Medicine

## 2015-06-16 VITALS — BP 105/70 | HR 74 | Temp 97.9°F | Resp 16 | Ht 59.0 in | Wt 199.0 lb

## 2015-06-16 DIAGNOSIS — Z1383 Encounter for screening for respiratory disorder NEC: Secondary | ICD-10-CM

## 2015-06-16 DIAGNOSIS — Z136 Encounter for screening for cardiovascular disorders: Secondary | ICD-10-CM | POA: Diagnosis not present

## 2015-06-16 DIAGNOSIS — Z130 Encounter for screening for diseases of the blood and blood-forming organs and certain disorders involving the immune mechanism: Secondary | ICD-10-CM

## 2015-06-16 DIAGNOSIS — Z1329 Encounter for screening for other suspected endocrine disorder: Secondary | ICD-10-CM

## 2015-06-16 DIAGNOSIS — R938 Abnormal findings on diagnostic imaging of other specified body structures: Secondary | ICD-10-CM

## 2015-06-16 DIAGNOSIS — M810 Age-related osteoporosis without current pathological fracture: Secondary | ICD-10-CM

## 2015-06-16 DIAGNOSIS — R7303 Prediabetes: Secondary | ICD-10-CM

## 2015-06-16 DIAGNOSIS — E119 Type 2 diabetes mellitus without complications: Secondary | ICD-10-CM

## 2015-06-16 DIAGNOSIS — Z1389 Encounter for screening for other disorder: Secondary | ICD-10-CM

## 2015-06-16 DIAGNOSIS — Z113 Encounter for screening for infections with a predominantly sexual mode of transmission: Secondary | ICD-10-CM

## 2015-06-16 DIAGNOSIS — Z0000 Encounter for general adult medical examination without abnormal findings: Secondary | ICD-10-CM | POA: Diagnosis not present

## 2015-06-16 DIAGNOSIS — Z23 Encounter for immunization: Secondary | ICD-10-CM

## 2015-06-16 DIAGNOSIS — Z124 Encounter for screening for malignant neoplasm of cervix: Secondary | ICD-10-CM

## 2015-06-16 DIAGNOSIS — E8881 Metabolic syndrome: Secondary | ICD-10-CM

## 2015-06-16 DIAGNOSIS — E785 Hyperlipidemia, unspecified: Secondary | ICD-10-CM

## 2015-06-16 DIAGNOSIS — I10 Essential (primary) hypertension: Secondary | ICD-10-CM

## 2015-06-16 DIAGNOSIS — Z1212 Encounter for screening for malignant neoplasm of rectum: Secondary | ICD-10-CM

## 2015-06-16 DIAGNOSIS — Z720 Tobacco use: Secondary | ICD-10-CM

## 2015-06-16 DIAGNOSIS — Z1239 Encounter for other screening for malignant neoplasm of breast: Secondary | ICD-10-CM

## 2015-06-16 DIAGNOSIS — Z1211 Encounter for screening for malignant neoplasm of colon: Secondary | ICD-10-CM

## 2015-06-16 DIAGNOSIS — R9389 Abnormal findings on diagnostic imaging of other specified body structures: Secondary | ICD-10-CM

## 2015-06-16 LAB — COMPREHENSIVE METABOLIC PANEL
ALT: 16 U/L (ref 6–29)
AST: 12 U/L (ref 10–35)
Albumin: 3.8 g/dL (ref 3.6–5.1)
Alkaline Phosphatase: 137 U/L — ABNORMAL HIGH (ref 33–130)
BILIRUBIN TOTAL: 0.5 mg/dL (ref 0.2–1.2)
BUN: 16 mg/dL (ref 7–25)
CALCIUM: 9.1 mg/dL (ref 8.6–10.4)
CO2: 25 mmol/L (ref 20–31)
CREATININE: 0.63 mg/dL (ref 0.50–0.99)
Chloride: 100 mmol/L (ref 98–110)
GLUCOSE: 154 mg/dL — AB (ref 65–99)
Potassium: 3.3 mmol/L — ABNORMAL LOW (ref 3.5–5.3)
SODIUM: 139 mmol/L (ref 135–146)
Total Protein: 6.5 g/dL (ref 6.1–8.1)

## 2015-06-16 LAB — LIPID PANEL
Cholesterol: 186 mg/dL (ref 125–200)
HDL: 34 mg/dL — AB (ref >=46)
LDL CALC: 109 mg/dL (ref <130)
Total CHOL/HDL Ratio: 5.5 Ratio — ABNORMAL HIGH (ref <=5.0)
Triglycerides: 215 mg/dL — ABNORMAL HIGH (ref <150)
VLDL: 43 mg/dL — ABNORMAL HIGH (ref <30)

## 2015-06-16 LAB — POCT GLYCOSYLATED HEMOGLOBIN (HGB A1C): HEMOGLOBIN A1C: 7

## 2015-06-16 LAB — TSH: TSH: 2.1 m[IU]/L

## 2015-06-16 MED ORDER — FUROSEMIDE 20 MG PO TABS: 20.0000 mg | ORAL_TABLET | Freq: Every day | ORAL | Status: DC

## 2015-06-16 MED ORDER — ZOSTER VACCINE LIVE 19400 UNT/0.65ML ~~LOC~~ SOLR: 0.6500 mL | Freq: Once | SUBCUTANEOUS | Status: DC

## 2015-06-16 MED ORDER — METFORMIN HCL 500 MG PO TABS: 500.0000 mg | ORAL_TABLET | Freq: Two times a day (BID) | ORAL | Status: DC

## 2015-06-17 ENCOUNTER — Telehealth: Payer: Self-pay | Admitting: Obstetrics and Gynecology

## 2015-06-17 LAB — MICROALBUMIN / CREATININE URINE RATIO
CREATININE, URINE: 10 mg/dL — AB (ref 20–320)
MICROALB/CREAT RATIO: 290 ug/mg{creat} — AB (ref <30)
Microalb, Ur: 2.9 mg/dL

## 2015-06-17 MED ORDER — ATORVASTATIN CALCIUM 40 MG PO TABS: 40.0000 mg | ORAL_TABLET | Freq: Every day | ORAL | Status: DC

## 2015-06-17 NOTE — Telephone Encounter (Signed)
Called and left a message for patient to call back to schedule a new patient doctor referral. °

## 2015-06-21 NOTE — Telephone Encounter (Signed)
Called and left a message for patient to call back to schedule a new patient doctor referral. °

## 2015-06-22 NOTE — Telephone Encounter (Signed)
Called and left a message for patient to call back to schedule a new patient doctor referral. °

## 2015-06-27 ENCOUNTER — Encounter: Payer: Self-pay | Admitting: Family Medicine

## 2015-07-06 ENCOUNTER — Encounter: Payer: Self-pay | Admitting: Obstetrics and Gynecology

## 2015-07-06 ENCOUNTER — Ambulatory Visit (INDEPENDENT_AMBULATORY_CARE_PROVIDER_SITE_OTHER): Payer: Commercial Managed Care - HMO | Admitting: Obstetrics and Gynecology

## 2015-07-06 VITALS — BP 136/70 | HR 88 | Resp 16 | Ht 58.5 in | Wt 197.6 lb

## 2015-07-06 DIAGNOSIS — R9389 Abnormal findings on diagnostic imaging of other specified body structures: Secondary | ICD-10-CM

## 2015-07-06 DIAGNOSIS — R938 Abnormal findings on diagnostic imaging of other specified body structures: Secondary | ICD-10-CM | POA: Diagnosis not present

## 2015-07-06 NOTE — Progress Notes (Signed)
Patient ID: Diane Giles, female   DOB: 08/13/1955, 60 y.o.   MRN: [id number] GYNECOLOGY  VISIT   HPI: 60 y.o.   Married  Caucasian  female   [id number] with Patient's last menstrual period was 05/07/2005 (approximate).   here for evaluation of abnormal pelvic ultrasound revealing thickened endometrium 05-21-14.   Ultrasound was done due to tenderness with pelvic exam last year.  No bleeding or spotting.  No prior HRT ever.  No GYN problems in the past.   CLINICAL DATA: Generalized pelvic pain on physical exam  EXAM: TRANSABDOMINAL AND TRANSVAGINAL ULTRASOUND OF PELVIS  TECHNIQUE: Both transabdominal and transvaginal ultrasound examinations of the pelvis were performed. Transabdominal technique was performed for global imaging of the pelvis including uterus, ovaries, adnexal regions, and pelvic cul-de-sac. It was necessary to proceed with endovaginal exam following the transabdominal exam to visualize the endometrium and ovaries.  COMPARISON: CT 08/26/2013  FINDINGS: Uterus  Measurements: 7.6 x 4.3 x 4.0 cm. No fibroids or other mass visualized.  Endometrium  Thickness: Approximately 9 mm, not well visualized. Punctate echogenicities likely indicate calcifications. Trace fluid within the endometrial canal.  Right ovary  Measurements: Not visualized. No adnexal mass  Left ovary  Measurements: Not well visualized. No adnexal mass  Other findings  No free fluid.  IMPRESSION: No acute abnormality. Endometrium not well visualized. Endometrial thickness is considered abnormal for an asymptomatic post-menopausal female. Endometrial sampling should be considered to exclude carcinoma.   Electronically Signed  By: Gretchen Green M.D.  On: 05/21/2014 15:50  Just retired yesterday.  Plans to travel in [place].   PCP - Dr. Eva Shaw.  GYNECOLOGIC HISTORY: Patient's last menstrual period was 05/07/2005  (approximate). Contraception:Postmenopausal Menopausal hormone therapy: none Last mammogram: 04-17-14 Asymmetrial density in Left breast and advised further views:[facility]-- Tells me she had a follow up 3D mammogram done and result was normal.  Next mammogram is due at the end of March 2017.  Last pap smear: 05-14-14 Neg:Neg HR HPV        OB History    Gravida Para Term Preterm AB TAB SAB Ectopic Multiple Living   5 5 5       5         Patient Active Problem List   Diagnosis Date Noted  . 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 Prediabetes 05/18/2014  . Diverticulitis 08/26/2013  . Leukocytosis 08/26/2013  . Abdominal pain 08/26/2013  . DDD (degenerative disc disease) 03/18/2013  . Metabolic syndrome 07/29/2012  . Vitamin D deficiency 07/29/2012  . Essential hypertension, benign 07/18/2012  . Hyperlipidemia 07/18/2012  . Obstructive sleep apnea 07/04/2012  . Osteoporosis 07/04/2012  . Morbid obesity (HCC) 07/04/2012  . Tobacco use disorder 07/04/2012  . Trochanteric bursitis of right hip 07/04/2012    Past Medical History  Diagnosis Date  . Osteoporosis   . Sleep apnea   . Diverticulitis   . Hypertension   . Arthritis     Past Surgical History  Procedure Laterality Date  . Lower aortic bypass  2000    per patient  . Elbow surgery Left ~2007    tendon repair by Dr. Norris  . Eye surgery Bilateral 2008    lasik  . Total hip arthroplasty Right 07/27/2014    Procedure: RIGHT TOTAL HIP ARTHROPLASTY ANTERIOR APPROACH;  Surgeon: Matthew Olin, MD;  Location: [facility];  Service: Orthopedics;  Laterality: Right;    Current Outpatient Prescriptions  Medication Sig Dispense Refill  . atenolol-chlorthalidone (TENORETIC) 50-25 MG per tablet   Patient ID: Diane Giles, female   DOB: [birth year], 60 y.o.   MRN: [id number] GYNECOLOGY  VISIT   HPI: 60 y.o.   Married  Caucasian  female   [phone] with Patient's last menstrual period was 05/07/2005 (approximate).   here for evaluation of abnormal pelvic ultrasound revealing thickened endometrium 05-21-14.   Ultrasound was done due to tenderness with pelvic exam last year.  No bleeding or spotting.  No prior HRT ever.  No GYN problems in the past.   CLINICAL DATA: Generalized pelvic pain on physical exam  EXAM: TRANSABDOMINAL AND TRANSVAGINAL ULTRASOUND OF PELVIS  TECHNIQUE: Both transabdominal and transvaginal ultrasound examinations of the pelvis were performed. Transabdominal technique was performed for global imaging of the pelvis including uterus, ovaries, adnexal regions, and pelvic cul-de-sac. It was necessary to proceed with endovaginal exam following the transabdominal exam to visualize the endometrium and ovaries.  COMPARISON: CT 08/26/2013  FINDINGS: Uterus  Measurements: 7.6 x 4.3 x 4.0 cm. No fibroids or other mass visualized.  Endometrium  Thickness: Approximately 9 mm, not well visualized. Punctate echogenicities likely indicate calcifications. Trace fluid within the endometrial canal.  Right ovary  Measurements: Not visualized. No adnexal mass  Left ovary  Measurements: Not well visualized. No adnexal mass  Other findings  No free fluid.  IMPRESSION: No acute abnormality. Endometrium not well visualized. Endometrial thickness is considered abnormal for an asymptomatic post-menopausal female. Endometrial sampling should be considered to exclude carcinoma.   Electronically Signed  By: Conchita Paris M.D.  On: 05/21/2014 15:50  Just retired yesterday.  Plans to travel in [place].   PCP - Dr. Delman Cheadle.  GYNECOLOGIC HISTORY: Patient's last menstrual period was 05/07/2005  (approximate). Contraception:Postmenopausal Menopausal hormone therapy: none Last mammogram: 04-17-14 Asymmetrial density in Left breast and advised further views:[facility]-- Tells me she had a follow up 3D mammogram done and result was normal.  Next mammogram is due at the end of March 2017.  Last pap smear: 05-14-14 Neg:Neg HR HPV        OB History    Gravida Para Term Preterm AB TAB SAB Ectopic Multiple Living   _0 Patient Active Problem List   Diagnosis Date Noted  . 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 Prediabetes 05/18/2014  . Diverticulitis 08/26/2013  . Leukocytosis 08/26/2013  . Abdominal pain 08/26/2013  . DDD (degenerative disc disease) 03/18/2013  . Metabolic syndrome 09/38/1829  . Vitamin D deficiency 07/29/2012  . Essential hypertension, benign 07/18/2012  . Hyperlipidemia 07/18/2012  . Obstructive sleep apnea 07/04/2012  . Osteoporosis 07/04/2012  . Morbid obesity ([facility]) 07/04/2012  . Tobacco use disorder 07/04/2012  . Trochanteric bursitis of right hip 07/04/2012    Past Medical History  Diagnosis Date  . Osteoporosis   . Sleep apnea   . Diverticulitis   . Hypertension   . Arthritis     Past Surgical History  Procedure Laterality Date  . Lower aortic bypass  2000    per patient  . Elbow surgery Left ~2007    tendon repair by Dr. Veverly Fells  . Eye surgery Bilateral 2008    las[facility]  . Total hip arthroplasty Right 07/27/2014    Procedure: RIGHT TOTAL HIP ARTHROPLASTY ANTERIOR APPROACH;  Surgeon: Paralee Cancel, MD;  Location: [facility];  Service: Orthopedics;  Laterality: Right;    Current Outpatient Prescriptions  Medication Sig Dispense Refill  . atenolol-chlorthalidone (TENORETIC) 50-25 MG per tablet  Patient ID: Diane Giles, female   DOB: [birth year], 60 y.o.   MRN: [id number] GYNECOLOGY  VISIT   HPI: 60 y.o.   Married  Caucasian  female   [phone] with Patient's last menstrual period was 05/07/2005 (approximate).   here for evaluation of abnormal pelvic ultrasound revealing thickened endometrium 05-21-14.   Ultrasound was done due to tenderness with pelvic exam last year.  No bleeding or spotting.  No prior HRT ever.  No GYN problems in the past.   CLINICAL DATA: Generalized pelvic pain on physical exam  EXAM: TRANSABDOMINAL AND TRANSVAGINAL ULTRASOUND OF PELVIS  TECHNIQUE: Both transabdominal and transvaginal ultrasound examinations of the pelvis were performed. Transabdominal technique was performed for global imaging of the pelvis including uterus, ovaries, adnexal regions, and pelvic cul-de-sac. It was necessary to proceed with endovaginal exam following the transabdominal exam to visualize the endometrium and ovaries.  COMPARISON: CT 08/26/2013  FINDINGS: Uterus  Measurements: 7.6 x 4.3 x 4.0 cm. No fibroids or other mass visualized.  Endometrium  Thickness: Approximately 9 mm, not well visualized. Punctate echogenicities likely indicate calcifications. Trace fluid within the endometrial canal.  Right ovary  Measurements: Not visualized. No adnexal mass  Left ovary  Measurements: Not well visualized. No adnexal mass  Other findings  No free fluid.  IMPRESSION: No acute abnormality. Endometrium not well visualized. Endometrial thickness is considered abnormal for an asymptomatic post-menopausal female. Endometrial sampling should be considered to exclude carcinoma.   Electronically Signed  By: Conchita Paris M.D.  On: 05/21/2014 15:50  Just retired yesterday.  Plans to travel in [place].   PCP - Dr. Delman Cheadle.  GYNECOLOGIC HISTORY: Patient's last menstrual period was 05/07/2005  (approximate). Contraception:Postmenopausal Menopausal hormone therapy: none Last mammogram: 04-17-14 Asymmetrial density in Left breast and advised further views:[facility]-- Tells me she had a follow up 3D mammogram done and result was normal.  Next mammogram is due at the end of March 2017.  Last pap smear: 05-14-14 Neg:Neg HR HPV        OB History    Gravida Para Term Preterm AB TAB SAB Ectopic Multiple Living   _0 Patient Active Problem List   Diagnosis Date Noted  . 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 Prediabetes 05/18/2014  . Diverticulitis 08/26/2013  . Leukocytosis 08/26/2013  . Abdominal pain 08/26/2013  . DDD (degenerative disc disease) 03/18/2013  . Metabolic syndrome 09/38/1829  . Vitamin D deficiency 07/29/2012  . Essential hypertension, benign 07/18/2012  . Hyperlipidemia 07/18/2012  . Obstructive sleep apnea 07/04/2012  . Osteoporosis 07/04/2012  . Morbid obesity ([facility]) 07/04/2012  . Tobacco use disorder 07/04/2012  . Trochanteric bursitis of right hip 07/04/2012    Past Medical History  Diagnosis Date  . Osteoporosis   . Sleep apnea   . Diverticulitis   . Hypertension   . Arthritis     Past Surgical History  Procedure Laterality Date  . Lower aortic bypass  2000    per patient  . Elbow surgery Left ~2007    tendon repair by Dr. Veverly Fells  . Eye surgery Bilateral 2008    [facility]  . Total hip arthroplasty Right 07/27/2014    Procedure: RIGHT TOTAL HIP ARTHROPLASTY ANTERIOR APPROACH;  Surgeon: Paralee Cancel, MD;  Location: [facility];  Service: Orthopedics;  Laterality: Right;    Current Outpatient Prescriptions  Medication Sig Dispense Refill  . atenolol-chlorthalidone (TENORETIC) 50-25 MG per tablet

## 2015-07-06 NOTE — Patient Instructions (Signed)
Endometrial Biopsy Endometrial biopsy is a procedure in which a tissue sample is taken from inside the uterus. The tissue sample is then looked at under a microscope to see if the tissue is normal or abnormal. The endometrium is the lining of the uterus. This procedure helps determine where you are in your menstrual cycle and how hormone levels are affecting the lining of the uterus. This procedure may also be used to evaluate uterine bleeding or to diagnose endometrial cancer, tuberculosis, polyps, or inflammatory conditions.  LET YOUR HEALTH CARE PROVIDER KNOW ABOUT:  Any allergies you have.  All medicines you are taking, including vitamins, herbs, eye drops, creams, and over-the-counter medicines.  Previous problems you or members of your family have had with the use of anesthetics.  Any blood disorders you have.  Previous surgeries you have had.  Medical conditions you have.  Possibility of pregnancy. RISKS AND COMPLICATIONS Generally, this is a safe procedure. However, as with any procedure, complications can occur. Possible complications include:  Bleeding.  Pelvic infection.  Puncture of the uterine wall with the biopsy device (rare). BEFORE THE PROCEDURE   Keep a record of your menstrual cycles as directed by your health care provider. You may need to schedule your procedure for a specific time in your cycle.  You may want to bring a sanitary pad to wear home after the procedure.  Arrange for someone to drive you home after the procedure if you will be given a medicine to help you relax (sedative). PROCEDURE   You may be given a sedative to relax you.  You will lie on an exam table with your feet and legs supported as in a pelvic exam.  Your health care provider will insert an instrument (speculum) into your vagina to see your cervix.  Your cervix will be cleansed with an antiseptic solution. A medicine (local anesthetic) will be used to numb the cervix.  A forceps  instrument (tenaculum) will be used to hold your cervix steady for the biopsy.  A thin, rodlike instrument (uterine sound) will be inserted through your cervix to determine the length of your uterus and the location where the biopsy sample will be removed.  A thin, flexible tube (catheter) will be inserted through your cervix and into the uterus. The catheter is used to collect the biopsy sample from your endometrial tissue.  The catheter and speculum will then be removed, and the tissue sample will be sent to a lab for examination. AFTER THE PROCEDURE  You will rest in a recovery area until you are ready to go home.  You may have mild cramping and a small amount of vaginal bleeding for a few days after the procedure. This is normal.  Make sure you find out how to get your test results.   This information is not intended to replace advice given to you by your health care provider. Make sure you discuss any questions you have with your health care provider.   Document Released: 08/24/2004 Document Revised: 12/24/2012 Document Reviewed: 10/08/2012 Elsevier Interactive Patient Education 2016 [employer]  

## 2015-07-07 ENCOUNTER — Telehealth: Payer: Self-pay | Admitting: Obstetrics and Gynecology

## 2015-07-07 NOTE — Telephone Encounter (Signed)
Called patient to review benefits for a recommended procedure. Left Voicemail requesting a call back. °

## 2015-07-08 NOTE — Telephone Encounter (Signed)
Patient returned call. Reviewed benefits for sonohysterogram and endometrial biopsy. Patient understood and agreeable. Patient ready to schedule. Patient scheduled 07/21/15 with Dr Quincy Simmonds. Pt aware of arrival date and time. Pt aware of 72 hours cancellation policy with [id number] fee. No further questions. Ok to close

## 2015-07-11 ENCOUNTER — Encounter: Payer: Self-pay | Admitting: Family Medicine

## 2015-07-19 LAB — HM DIABETES EYE EXAM

## 2015-07-21 ENCOUNTER — Ambulatory Visit (INDEPENDENT_AMBULATORY_CARE_PROVIDER_SITE_OTHER): Payer: Commercial Managed Care - HMO

## 2015-07-21 ENCOUNTER — Encounter: Payer: Self-pay | Admitting: Obstetrics and Gynecology

## 2015-07-21 ENCOUNTER — Ambulatory Visit (INDEPENDENT_AMBULATORY_CARE_PROVIDER_SITE_OTHER): Payer: Commercial Managed Care - HMO | Admitting: Obstetrics and Gynecology

## 2015-07-21 ENCOUNTER — Other Ambulatory Visit: Payer: Self-pay | Admitting: Obstetrics and Gynecology

## 2015-07-21 DIAGNOSIS — R9389 Abnormal findings on diagnostic imaging of other specified body structures: Secondary | ICD-10-CM

## 2015-07-21 DIAGNOSIS — R938 Abnormal findings on diagnostic imaging of other specified body structures: Secondary | ICD-10-CM

## 2015-07-21 NOTE — Patient Instructions (Signed)

## 2015-07-21 NOTE — Progress Notes (Signed)
Subjective  60 y.o. G8P5005 Married Caucasian female here for sonohysterogram and endometrial biopsy in follow up to pelvic ultrasound noting thickened endometrium. Ultrasound was ordered for tenderness with pelvic exam performed by PCP.  EMS measured 9 mm.  Patient's last menstrual period was 05/07/2005 (approximate).   Not taking HRT.  Denies vaginal bleeding.   Objective  Technique:  Both transabdominal and transvaginal ultrasound examinations of the pelvis were performed. Transabdominal technique was performed for global imaging of the pelvis including uterus, ovaries, adnexal regions, and pelvic cul-de-sac. It was necessary to proceed with endovaginal exam following the abdominal ultrasound.  Transabdominal exam to visualize the endometrium and adnexa.  Color and duplex Doppler ultrasound was utilized to evaluate blood flow to the ovaries.   Pelvic ultrasound images and report reviewed with patient.  Uterus - no masses. EMS - 6.58 mm. Ovaries - normal. Free fluid - no        Technique:  Both transabdominal and transvaginal ultrasound examinations of the pelvis were performed. Transabdominal technique was performed for global imaging of the pelvis including uterus, ovaries, adnexal regions, and pelvic cul-de-sac. It was necessary to proceed with endovaginal exam following the abdominal ultrasound.  Transabdominal exam to visualize the endometrium and adnexa.  Color and duplex Doppler ultrasound was utilized to evaluate blood flow to the ovaries.    Procedure - sonohysterogram Consent performed. Speculum placed in vagina. Sterile prep of cervix with Hibiclens. Cannula placed inside endometrial cavity without difficulty. Speculum removed. Sterile saline injected.     No         filling defect noted.  EMS measured 10 mm. Cannula removed. No complication.   Procedure - endometrial biopsy Consent performed. Speculum place in vagina.  Sterile prep of cervix with   hibiclens Tenaculum to anterior cervical lip.   Pipelle placed to       8   cm without difficulty twice. Tissue obtained and sent to pathology. Speculum removed.  No complications. Minimal EBL.  Assessment  Thickened endometrium confirmed on sonohysterogram.  Follow up EMB results.  Instructions and precautions given. Discussed that pathology report will evaluate for polyps, hyperplasia and malignancy.  Discussed possible recommendations after pathology report back - observation and report if PMB occurs, dilation and curettage, progesterone tx, and hysterectomy.   ___15____ minutes face to face time of which over 50% was spent in counseling.   After visit summary to patient.  Patient felt a little nauseous after procedure.  Given a cool cloth on neck.  Nausea passed.  Ok to return home.

## 2015-07-25 LAB — IPS OTHER TISSUE BIOPSY

## 2015-07-28 ENCOUNTER — Telehealth: Payer: Self-pay | Admitting: Obstetrics and Gynecology

## 2015-07-28 ENCOUNTER — Other Ambulatory Visit: Payer: Self-pay | Admitting: Obstetrics and Gynecology

## 2015-07-28 DIAGNOSIS — R9389 Abnormal findings on diagnostic imaging of other specified body structures: Secondary | ICD-10-CM | POA: Insufficient documentation

## 2015-07-28 MED ORDER — MEDROXYPROGESTERONE ACETATE 10 MG PO TABS
10.0000 mg | ORAL_TABLET | Freq: Every day | ORAL | Status: DC
Start: 2015-07-28 — End: 2015-08-31

## 2015-07-28 NOTE — Telephone Encounter (Signed)
Phone call to discuss endometrial biopsy results. Weakly proliferative endometrium noted.  No atypia or malignancy.   Will proceed with Provera 10 mg po q d for 10 days per month for 3 months for reduction of risk of endometrial hyperplasia. Explained she may have vaginal bleeding, breast tenderness, constipation symptoms. Then return in 3 months for a repeat endometrial biopsy.  Weight loss recommended.  Questions invited and answered.

## 2015-08-31 ENCOUNTER — Other Ambulatory Visit: Payer: Self-pay | Admitting: Obstetrics and Gynecology

## 2015-08-31 DIAGNOSIS — R9389 Abnormal findings on diagnostic imaging of other specified body structures: Secondary | ICD-10-CM

## 2015-08-31 NOTE — Telephone Encounter (Signed)
Please schedule a repeat endometrial biopsy with me for July 2017.  I am placing an order.  I am refilling her Provera which she is to take monthly.  She is aware that the plan is to do the follow up EMB.

## 2015-08-31 NOTE — Telephone Encounter (Signed)
Medication refill request: Medroxyprogesterone (provera) 10mg  Last AEX:  05/14/14 Dr. Quincy Simmonds Next AEX: 10/27/15 Last MMG (if hormonal medication request): 07/07/15 BIRADS2 benign Refill authorized: 07/28/15 #10 w/2 refills today please advise

## 2015-09-01 NOTE — Telephone Encounter (Signed)
Left message to call Kaitlyn at [phone]. 

## 2015-09-01 NOTE — Telephone Encounter (Signed)
Spoke with patient. Advised of message as seen below from [place]. She is agreeable and verbalizes understanding. Appointment for endometrial biopsy scheduled for 11/16/2015 at 8:15 am with Dr.Silva. She is agreeable to date and time. Order placed for precert.  Routing to provider for final review. Patient agreeable to disposition. Will close encounter.

## 2015-09-01 NOTE — Addendum Note (Signed)
Addended by: Jasmine Awe on: 09/01/2015 09:28 AM   Modules accepted: Orders

## 2015-09-12 ENCOUNTER — Other Ambulatory Visit: Payer: Self-pay | Admitting: Family Medicine

## 2015-09-13 ENCOUNTER — Other Ambulatory Visit: Payer: Self-pay | Admitting: Family Medicine

## 2015-10-21 DIAGNOSIS — M7611 Psoas tendinitis, right hip: Secondary | ICD-10-CM | POA: Insufficient documentation

## 2015-10-26 NOTE — Progress Notes (Signed)
Subjective:    Patient ID: Diane Giles, female    DOB: [birth year], 60 y.o.   MRN: [id number] Chief Complaint  Patient presents with  . follow up diabetes     HPI  Diane Giles is a 60 yo woman here today to follow up on her chronic medical conditions.  I last saw her 4 mos prior for her CPE.  DMII:  Recent diagnosis, hgba1c was 6.4-6.5 but at last visit 4 mos prior Hgba1c increased to 7.0 so pt started on metformin 500 bid. microalb done 06/2015 was mildly elev. On  optho visit? (referred at last vsiti).  Has not been checking cbgs - does not want to pick her finger.  Taking asa. Seen at [facility] in Feb 2017.  She has been going to the gym 4-5x/wk but her hip is holding her back - she has had no sig improvement in hip pain since her hip replacement. Ortho here had nothing else to offer her so she went to [facility] who is started a specific PT and injections. She feels she underwent the hip replacement unnecessarily. She cannot do bike or elipitical but can do stairs, squats, and weight lifting but she still can't walk.  HTN: On lasix 20, atenolol, and chlorthalidone 25.  HPL: above goal of LDL<100 with LDL 109 and non-hdl of 152. On lipitor 40. Taking fish oil bid and baby asa 81mg .  Tob abuse: 1/2 ppd x 40 yrs but was considering quitting since she retired. get screening lung ct?  OSA: on cpap regularly - needs replacement for masks/tubing. Osteopenia with h/o vit D def: vit D 50 9/16, takes otc vit D daily and drinks >3 glasses milk/d. Did not get zostavax due to cost. Pt retired in Feb.  Past Medical History  Diagnosis Date  . Osteoporosis   . Sleep apnea   . Diverticulitis   . Hypertension   . Arthritis    Past Surgical History  Procedure Laterality Date  . Lower aortic bypass  2000    per patient  . Elbow surgery Left ~2007    tendon repair by Dr. Ranell Patrick  . Eye surgery Bilateral 2008    lasik  . 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 blood glucose meter kit and supplies Dispense based on patient and insurance preference. Use as directed once a day. (DX: R73.9) 1 each 0  . glucose blood (GLUCOSE METER TEST) test strip Use as instructed once a day. (DX:R73.9) 50 each 2  . Lancets MISC Use as directed once a day. (DX: R73.9) 100 each 2  . medroxyPROGESTERone (PROVERA) 10 MG tablet Take 1 tablet (10 mg total) by mouth daily. Take for 10 days each month. 10 tablet 2  . Omega-3 Fatty Acids (OMEGA 3 PO) Take 1 capsule by mouth 2 (two) times daily.     No current facility-administered medications on file prior to visit.   Allergies  Allergen Reactions  . Codeine Itching   Family History  Problem Relation Age of Onset  . Cancer Father 67    Dec age 56 with pancreatic Ca  . Migraines Mother   . Thyroid disease Mother     hypothyroid   Social History   Social History  . Marital Status: Married  Spouse Name: N/A  . Number of Children: N/A  . Years of Education: N/A   Occupational History  . Tree surgeon    Social History Main Topics  . Smoking status: Current Every Day Smoker -- 0.50 packs/day for 40 years    Types: Cigarettes  . Smokeless tobacco: Never Used  . Alcohol Use: 0.0 oz/week    0 Standard drinks or equivalent per week     Comment: very rare--1/month  . Drug Use: No  . Sexual Activity:    Partners: Male    Birth Control/ Protection: Post-menopausal   Other Topics Concern  . None   Social History Narrative   Married. Patient does not exercise. She has a Geographical information systems officer.   Depression screen [facility] 2/9 10/27/2015 06/16/2015 01/20/2015 09/17/2014 05/14/2014  Decreased Interest 0 0 0 0 0  Down, Depressed, Hopeless 0 0 0 0 1  PHQ - 2 Score 0 0 0 0 1     Review of Systems    Constitutional: Positive for activity change (increased). Negative for fever, chills, diaphoresis, appetite change, fatigue and unexpected weight change.  Eyes: Negative for visual disturbance.  Respiratory: Negative for cough and shortness of breath.   Cardiovascular: Positive for leg swelling. Negative for chest pain and palpitations.  Genitourinary: Negative for decreased urine volume.  Musculoskeletal: Positive for myalgias, arthralgias and gait problem. Negative for joint swelling, neck pain and neck stiffness.  Neurological: Negative for syncope and headaches.  Hematological: Does not bruise/bleed easily.  Psychiatric/Behavioral: Positive for sleep disturbance. Negative for dysphoric mood.       Objective:  BP 115/62 mmHg  Pulse 82  Temp(Src) 97.6 F (36.4 C) (Oral)  Resp 16  Ht 4\' 11"  (1.499 m)  Wt 186 lb (84.369 kg)  BMI 37.55 kg/m2  LMP 05/07/2005 (Approximate)  Physical Exam  Constitutional: She is oriented to person, place, and time. She appears well-developed and well-nourished. No distress.  HENT:  Head: Normocephalic and atraumatic.  Right Ear: External ear normal.  Left Ear: External ear normal.  Eyes: Conjunctivae are normal. No scleral icterus.  Neck: Normal range of motion. Neck supple. No thyromegaly present.  Cardiovascular: Normal rate, regular rhythm, normal heart sounds and intact distal pulses.   Pulmonary/Chest: Effort normal and breath sounds normal. No respiratory distress.  Musculoskeletal: She exhibits no edema.  Lymphadenopathy:    She has no cervical adenopathy.  Neurological: She is alert and oriented to person, place, and time.  Skin: Skin is warm and dry. She is not diaphoretic. No erythema.  Psychiatric: She has a normal mood and affect. Her behavior is normal.          Assessment & Plan:   Send order for DEXA to [facility]  1. Type 2 diabetes mellitus without complication, without long-term current use of insulin (HCC)  - a1c much  improved at 6.4, cont metformin 500 bid. Doing great with weightloss, may want to consider increasing dose if weightloss plateaus soon.  2. Essential hypertension, benign   3. Hyperlipidemia - LDL at goal <100, cont lipitor 40  4. Tobacco use disorder   5. Obstructive sleep apnea   6. Osteoporosis   7. Vitamin D deficiency   8. Morbid obesity due to excess calories (HCC)     Orders Placed This Encounter  Procedures  . DG Bone Density    Please send to [facility].    Standing Status: Future     Number of Occurrences:      Standing Expiration Date: 12/25/2016  Order Specific Question:  Reason for Exam (SYMPTOM  OR DIAGNOSIS REQUIRED)    Answer:  f/u osteopenia, post-menopausal estrogen deficiency    Order Specific Question:  Preferred imaging location?    Answer:  External  . Comprehensive metabolic panel    Order Specific Question:  Has the patient fasted?    Answer:  Yes  . Lipid panel    Order Specific Question:  Has the patient fasted?    Answer:  Yes  . POCT glycosylated hemoglobin (Hb A1C)    Meds ordered this encounter  Medications  . atenolol-chlorthalidone (TENORETIC) 50-25 MG tablet    Sig: Take 1 tablet by mouth daily.    Dispense:  90 tablet    Refill:  3  . furosemide (LASIX) 20 MG tablet    Sig: Take 1 tablet (20 mg total) by mouth daily.    Dispense:  90 tablet    Refill:  3  . metFORMIN (GLUCOPHAGE) 500 MG tablet    Sig: Take 1 tablet (500 mg total) by mouth 2 (two) times daily with a meal.    Dispense:  180 tablet    Refill:  3  . atorvastatin (LIPITOR) 40 MG tablet    Sig: Take 1 tablet (40 mg total) by mouth daily at 6 PM.    Dispense:  90 tablet    Refill:  3     Norberto Sorenson, M.D.  [facility] [address] [place], [place] [address] [phone] phone [phone] fax  10/28/2015 12:35 PM

## 2015-10-27 ENCOUNTER — Ambulatory Visit (INDEPENDENT_AMBULATORY_CARE_PROVIDER_SITE_OTHER): Payer: Commercial Managed Care - HMO | Admitting: Family Medicine

## 2015-10-27 ENCOUNTER — Encounter: Payer: Self-pay | Admitting: Family Medicine

## 2015-10-27 VITALS — BP 115/62 | HR 82 | Temp 97.6°F | Resp 16 | Ht 59.0 in | Wt 186.0 lb

## 2015-10-27 DIAGNOSIS — E785 Hyperlipidemia, unspecified: Secondary | ICD-10-CM | POA: Diagnosis not present

## 2015-10-27 DIAGNOSIS — I10 Essential (primary) hypertension: Secondary | ICD-10-CM

## 2015-10-27 DIAGNOSIS — F17200 Nicotine dependence, unspecified, uncomplicated: Secondary | ICD-10-CM

## 2015-10-27 DIAGNOSIS — M810 Age-related osteoporosis without current pathological fracture: Secondary | ICD-10-CM

## 2015-10-27 DIAGNOSIS — E119 Type 2 diabetes mellitus without complications: Secondary | ICD-10-CM

## 2015-10-27 DIAGNOSIS — G4733 Obstructive sleep apnea (adult) (pediatric): Secondary | ICD-10-CM | POA: Diagnosis not present

## 2015-10-27 DIAGNOSIS — E559 Vitamin D deficiency, unspecified: Secondary | ICD-10-CM

## 2015-10-27 LAB — LIPID PANEL
Cholesterol: 168 mg/dL (ref 125–200)
HDL: 39 mg/dL — AB (ref >=46)
LDL CALC: 97 mg/dL (ref <130)
TRIGLYCERIDES: 159 mg/dL — AB (ref <150)
Total CHOL/HDL Ratio: 4.3 Ratio (ref <=5.0)
VLDL: 32 mg/dL — ABNORMAL HIGH (ref <30)

## 2015-10-27 LAB — COMPREHENSIVE METABOLIC PANEL
ALK PHOS: 118 U/L (ref 33–130)
ALT: 15 U/L (ref 6–29)
AST: 14 U/L (ref 10–35)
Albumin: 3.8 g/dL (ref 3.6–5.1)
BILIRUBIN TOTAL: 0.5 mg/dL (ref 0.2–1.2)
BUN: 15 mg/dL (ref 7–25)
CHLORIDE: 100 mmol/L (ref 98–110)
CO2: 28 mmol/L (ref 20–31)
CREATININE: 0.75 mg/dL (ref 0.50–0.99)
Calcium: 9.3 mg/dL (ref 8.6–10.4)
Glucose, Bld: 128 mg/dL — ABNORMAL HIGH (ref 65–99)
Potassium: 3.9 mmol/L (ref 3.5–5.3)
SODIUM: 140 mmol/L (ref 135–146)
TOTAL PROTEIN: 6.4 g/dL (ref 6.1–8.1)

## 2015-10-27 LAB — POCT GLYCOSYLATED HEMOGLOBIN (HGB A1C): HEMOGLOBIN A1C: 6.4

## 2015-10-27 MED ORDER — ATENOLOL-CHLORTHALIDONE 50-25 MG PO TABS: 1.0000 | ORAL_TABLET | Freq: Every day | ORAL | Status: DC

## 2015-10-27 MED ORDER — FUROSEMIDE 20 MG PO TABS: 20.0000 mg | ORAL_TABLET | Freq: Every day | ORAL | Status: DC

## 2015-10-27 MED ORDER — METFORMIN HCL 500 MG PO TABS: 500.0000 mg | ORAL_TABLET | Freq: Two times a day (BID) | ORAL | Status: DC

## 2015-10-27 NOTE — Patient Instructions (Addendum)
Diane Giles is now offering annual lung cancer screening by low-dose CT scan.  This is covered for qualifying patients and your insurance will be checked before the procedure.  Call the lung cancer screening nurse navigators at [phone] to learn more about this and get scheduled.         IF you received an x-ray today, you will receive an invoice from [facility]. Please contact [facility] at [phone] with questions or concerns regarding your invoice.   IF you received labwork today, you will receive an invoice from [facility]. Please contact [facility] at [phone] with questions or concerns regarding your invoice.   Our billing staff will not be able to assist you with questions regarding bills from these companies.  You will be contacted with the lab results as soon as they are available. The fastest way to get your results is to activate your My Chart account. Instructions are located on the last page of this paperwork. If you have not heard from us regarding the results in 2 weeks, please contact this office.      

## 2015-10-28 MED ORDER — ATORVASTATIN CALCIUM 40 MG PO TABS: 40.0000 mg | ORAL_TABLET | Freq: Every day | ORAL | Status: DC

## 2015-11-05 HISTORY — PX: DILATION AND CURETTAGE OF UTERUS: SHX78

## 2015-11-16 ENCOUNTER — Encounter: Payer: Self-pay | Admitting: Obstetrics and Gynecology

## 2015-11-16 ENCOUNTER — Ambulatory Visit (INDEPENDENT_AMBULATORY_CARE_PROVIDER_SITE_OTHER): Payer: Commercial Managed Care - HMO | Admitting: Obstetrics and Gynecology

## 2015-11-16 DIAGNOSIS — R938 Abnormal findings on diagnostic imaging of other specified body structures: Secondary | ICD-10-CM

## 2015-11-16 DIAGNOSIS — R9389 Abnormal findings on diagnostic imaging of other specified body structures: Secondary | ICD-10-CM

## 2015-11-16 NOTE — Patient Instructions (Signed)

## 2015-11-16 NOTE — Progress Notes (Signed)
GYNECOLOGY  VISIT   HPI: 60 y.o.   Married  Caucasian  female   [phone] with Patient's last menstrual period was 05/07/2005 (approximate).   here for Endo biopsy  Hx of thickened endometrium and weakly proliferative endometrium on EMB in March 2017.   Did have bleeding with the first course of Provera.  None since.  Has done tx for 3 months.  Had cramping.   Wishes for lidocaine today.   GYNECOLOGIC HISTORY: Patient's last menstrual period was 05/07/2005 (approximate). Contraception:  Pot-menopausal  Menopausal hormone therapy:  None Last mammogram:  05/06/14 BIRADS2:benign . Has appt today  Last pap smear:   05/14/14 Neg. HR HPV:neg        OB History    Gravida Para Term Preterm AB TAB SAB Ectopic Multiple Living   5 5 5       5          Patient Active Problem List   Diagnosis Date Noted  . Endometrial thickening on ultra sound 07/28/2015  . 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 Type 2 diabetes mellitus ([place]) 05/18/2014  . Diverticulitis 08/26/2013  . Leukocytosis 08/26/2013  . Abdominal pain 08/26/2013  . DDD (degenerative disc disease) 03/18/2013  . Metabolic syndrome 84/16/6063  . Vitamin D deficiency 07/29/2012  . Essential hypertension, benign 07/18/2012  . Hyperlipidemia 07/18/2012  . Obstructive sleep apnea 07/04/2012  . Osteoporosis 07/04/2012  . Morbid obesity ([place]) 07/04/2012  . Tobacco use disorder 07/04/2012  . Trochanteric bursitis of right hip 07/04/2012    Past Medical History  Diagnosis Date  . Osteoporosis   . Sleep apnea   . Diverticulitis   . Hypertension   . Arthritis     Past Surgical History  Procedure Laterality Date  . Lower aortic bypass  2000    per patient  . Elbow surgery Left ~2007    tendon repair by Dr. Veverly Fells  . Eye surgery Bilateral 2008    lasik  . Total hip arthroplasty Right 07/27/2014    Procedure: RIGHT TOTAL HIP ARTHROPLASTY ANTERIOR APPROACH;  Surgeon: Paralee Cancel, MD;  Location: [facility];  Service: Orthopedics;   Laterality: Right;    Current Outpatient Prescriptions  Medication Sig Dispense Refill  . atenolol-chlorthalidone (TENORETIC) 50-25 MG tablet Take 1 tablet by mouth daily. 90 tablet 3  . atorvastatin (LIPITOR) 40 MG tablet Take 1 tablet (40 mg total) by mouth daily at 6 PM. 90 tablet 3  . blood glucose meter kit and supplies Dispense based on patient and insurance preference. Use as directed once a day. (DX: R73.9) 1 each 0  . furosemide (LASIX) 20 MG tablet Take 1 tablet (20 mg total) by mouth daily. 90 tablet 3  . glucose blood (GLUCOSE METER TEST) test strip Use as instructed once a day. (DX:R73.9) 50 each 2  . Lancets MISC Use as directed once a day. (DX: R73.9) 100 each 2  . metFORMIN (GLUCOPHAGE) 500 MG tablet Take 1 tablet (500 mg total) by mouth 2 (two) times daily with a meal. 180 tablet 3  . Omega-3 Fatty Acids (OMEGA 3 PO) Take 1 capsule by mouth 2 (two) times daily.     No current facility-administered medications for this visit.     ALLERGIES: Codeine  Family History  Problem Relation Age of Onset  . Cancer Father 24    Dec age 62 with pancreatic Ca  . Migraines Mother   . Thyroid disease Mother     hypothyroid    Social History  Social History  . Marital Status: Married    Spouse Name: N/A  . Number of Children: N/A  . Years of Education: N/A   Occupational History  . Art gallery manager    Social History Main Topics  . Smoking status: Current Every Day Smoker -- 0.50 packs/day for 40 years    Types: Cigarettes  . Smokeless tobacco: Never Used  . Alcohol Use: 0.0 oz/week    0 Standard drinks or equivalent per week     Comment: very rare--1/month  . Drug Use: No  . Sexual Activity:    Partners: Male    Birth Control/ Protection: Post-menopausal   Other Topics Concern  . Not on file   Social History Narrative   Married. Patient does not exercise. She has a Gaffer.    ROS:  Pertinent items are noted in HPI.  PHYSICAL EXAMINATION:    BP  100/60 mmHg  Pulse 76  Resp 16  Ht 4' 11"  (1.499 m)  Wt 189 lb (85.73 kg)  BMI 38.15 kg/m2  LMP 05/07/2005 (Approximate)    General appearance: alert, cooperative and appears stated age  Pelvic: External genitalia:  no lesions              Urethra:  normal appearing urethra with no masses, tenderness or lesions              Bartholins and Skenes: normal                 Vagina: normal appearing vagina with normal color and discharge, no lesions              Cervix: no lesions       Bimanual Exam:  Uterus:  normal size, contour, position, consistency, mobility, non-tender              Adnexa: normal adnexa and no mass, fullness, tenderness           Procedure - endometrial biopsy Consent performed. Speculum place in vagina.  Sterile prep of cervix with  Hibiclens. Tenaculum to anterior cervical lip. Paracervical block with 10 cc 1% lidocaine _______________ yes. Lot [id number], expiration 07/05/16.  Pipelle placed to     8     cm without difficulty twice. Tissue obtained and sent to pathology. Speculum removed.  No complications. Minimal EBL.  Chaperone was present for exam.  ASSESSMENT  Thickened endometrium in menopause.  Proliferative endometrium tx with 3 months of Provera.   PLAN  Discussed rationale for tx and goal to reduce risk of endometrial hyperplasia and cancer.  Follow up EMB.  Final plan to follow.  Call for any bleeding in menopause.  Mammogram today.    An After Visit Summary was printed and given to the patient.  __10____ minutes face to face time of which over 50% was spent in counseling.

## 2015-11-18 LAB — IPS OTHER TISSUE BIOPSY

## 2015-11-24 ENCOUNTER — Telehealth: Payer: Self-pay | Admitting: Emergency Medicine

## 2015-11-24 DIAGNOSIS — R9389 Abnormal findings on diagnostic imaging of other specified body structures: Secondary | ICD-10-CM

## 2015-11-24 MED ORDER — PROGESTERONE MICRONIZED 200 MG PO CAPS: 200.0000 mg | ORAL_CAPSULE | Freq: Every day | ORAL | Status: DC

## 2015-11-24 NOTE — Telephone Encounter (Signed)
Call to patient and she is given message from Dr. Quincy Simmonds and agrees to plan as directed by Dr. Quincy Simmonds.  She states she has not taken Provera since June. She would like to try Prometrium and side effect of possible somnolence discussed. Patient is scheduled for follow up Endometrial biopsy 02/29/16 at 1000 and order placed.   Prometrium sent to [facility].  Questions answered and patient will call back with any vaginal bleeding or any concerns.  Routing to provider for final review. Patient agreeable to disposition. Will close encounter.

## 2015-11-24 NOTE — Telephone Encounter (Signed)
-----   Message from Nunzio Cobbs, MD sent at 11/23/2015  5:15 PM EDT ----- Please inform patient of EMB results showing polypoid endometrium with weakly proliferative endometrium.   The Provera does not appear to have made a change in the microscopic appearance of the tissue of the uterine cavity.  (She had a negative sonohysterogram already.)  I am recommending make a change to the following: Norethindrone 5 mg po bid for 3 months OR Prometrium 200 mg po q hs for 3 months.   The main difference is that the Prometrium can cause somnolence which some patients like and some do not.   I am recommending a repeat endometrial biopsy in 3 months.  Order will need to be placed and appointment scheduled after speaking with the patient.  Please call for any vaginal bleeding.  Cc- Marisa Sprinkles

## 2015-11-25 ENCOUNTER — Telehealth: Payer: Self-pay | Admitting: Emergency Medicine

## 2015-11-25 NOTE — Telephone Encounter (Signed)
Spoke with patient after reviewing with Dr. Quincy Simmonds.  Patient will start prometrium tonight as planned. Consult with Dr. Quincy Simmonds scheduled for 11/30/15 as patient is out of town currently.  She is advised to call back with any heavy bleeding or concerning symptoms. Advised patient to call back or seek immediate medical care if bleeding worsens or soaking through 1 pad/tampon per hour for two hours or if becomes symptomatic with sob, chest pain, fatigued, lightheaded, or weakness.  Patient verbalized understanding.   Routing to provider for final review. Patient agreeable to disposition. Will close encounter.

## 2015-11-25 NOTE — Telephone Encounter (Signed)
Patient calling to advise that she started having vaginal bleeding today. Describes bleeding "like a period." No heavy bleeding, no pain.  Starting prometrium tonight.  She is calling to make sure nothing else needed at this time.  Advised will review with Dr. Quincy Simmonds and return call with any new instructions.  Patient agreeable.

## 2015-11-30 ENCOUNTER — Ambulatory Visit (INDEPENDENT_AMBULATORY_CARE_PROVIDER_SITE_OTHER): Payer: Commercial Managed Care - HMO | Admitting: Obstetrics and Gynecology

## 2015-11-30 VITALS — BP 118/78 | HR 76 | Ht 59.0 in | Wt 184.2 lb

## 2015-11-30 DIAGNOSIS — N950 Postmenopausal bleeding: Secondary | ICD-10-CM | POA: Diagnosis not present

## 2015-11-30 NOTE — Progress Notes (Signed)
GYNECOLOGY  VISIT   HPI: 60 y.o.   Married  Caucasian  female   [phone] with Patient's last menstrual period was 05/07/2005 (approximate).   here for follow up after endometrial biopsy and recent postmenopausal bleeding.    History of thickened endometrium noted on ultrasound by outside provider. EMB in follow up showed proliferative endometrium.  Was treated with 3 months of Provera for proliferative endometrium which did not produce any change. Had follow up EMB showing proliferative and polypoid endometrium.  Rx for Prometrium given but had spontaneous vaginal bleeding prior to starting Prometrium.  Bleeding has now stopped. She was told it was ok to start the Prometrium. No side effects except night sweats.   Hx of aortic bypass in 2000. Surgery was done here in [place]. No cardiologist.  Hx elevated blood sugar.  Uses tobacco.   Right hip arthroplasty in 2016.   PCP is Dr. Brigitte Pulse.  GYNECOLOGIC HISTORY: Patient's last menstrual period was 05/07/2005 (approximate). Contraception:  Postmenopausal Menopausal hormone therapy:  Prometrium 255m Last mammogram:  10/2015 normal per patient:[facility] Last pap smear:  05/14/14 Neg. HR HPV:neg          OB History    Gravida Para Term Preterm AB Living   5 5 5     5    SAB TAB Ectopic Multiple Live Births                     Patient Active Problem List   Diagnosis Date Noted  . Endometrial thickening on ultra sound 07/28/2015  . 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 Type 2 diabetes mellitus ([facility] 05/18/2014  . Diverticulitis 08/26/2013  . Leukocytosis 08/26/2013  . Abdominal pain 08/26/2013  . DDD (degenerative disc disease) 03/18/2013  . Metabolic syndrome 050/53/9767 . Vitamin D deficiency 07/29/2012  . Essential hypertension, benign 07/18/2012  . Hyperlipidemia 07/18/2012  . Obstructive sleep apnea 07/04/2012  . Osteoporosis 07/04/2012  . Morbid obesity ([facility] 07/04/2012  . Tobacco use disorder 07/04/2012  . Trochanteric bursitis of  right hip 07/04/2012    Past Medical History:  Diagnosis Date  . Arthritis   . Diverticulitis   . Hypertension   . Osteoporosis   . Sleep apnea     Past Surgical History:  Procedure Laterality Date  . ELBOW SURGERY Left ~2007   tendon repair by Dr. NVeverly Fells . EYE SURGERY Bilateral 2008   lasik  . lower aortic bypass  2000   per patient  . TOTAL HIP ARTHROPLASTY Right 07/27/2014   Procedure: RIGHT TOTAL HIP ARTHROPLASTY ANTERIOR APPROACH;  Surgeon: MParalee Cancel MD;  Location: [facility];  Service: Orthopedics;  Laterality: Right;    Current Outpatient Prescriptions  Medication Sig Dispense Refill  . atenolol-chlorthalidone (TENORETIC) 50-25 MG tablet Take 1 tablet by mouth daily. 90 tablet 3  . atorvastatin (LIPITOR) 40 MG tablet Take 1 tablet (40 mg total) by mouth daily at 6 PM. 90 tablet 3  . blood glucose meter kit and supplies Dispense based on patient and insurance preference. Use as directed once a day. (DX: R73.9) 1 each 0  . furosemide (LASIX) 20 MG tablet Take 1 tablet (20 mg total) by mouth daily. 90 tablet 3  . glucose blood (GLUCOSE METER TEST) test strip Use as instructed once a day. (DX:R73.9) 50 each 2  . Lancets MISC Use as directed once a day. (DX: R73.9) 100 each 2  . metFORMIN (GLUCOPHAGE) 500 MG tablet Take 1 tablet (500 mg total) by mouth  2 (two) times daily with a meal. 180 tablet 3  . Omega-3 Fatty Acids (OMEGA 3 PO) Take 1 capsule by mouth 2 (two) times daily.    . progesterone (PROMETRIUM) 200 MG capsule Take 1 capsule (200 mg total) by mouth at bedtime. 90 capsule 0   No current facility-administered medications for this visit.      ALLERGIES: Codeine  Family History  Problem Relation Age of Onset  . Cancer Father 69    Dec age 2 with pancreatic Ca  . Migraines Mother   . Thyroid disease Mother     hypothyroid    Social History   Social History  . Marital status: Married    Spouse name: N/A  . Number of children: N/A  . Years of education:  N/A   Occupational History  . Art gallery manager    Social History Main Topics  . Smoking status: Current Every Day Smoker    Packs/day: 0.50    Years: 40.00    Types: Cigarettes  . Smokeless tobacco: Never Used  . Alcohol use 0.0 oz/week     Comment: very rare--1/month  . Drug use: No  . Sexual activity: Yes    Partners: Male    Birth control/ protection: Post-menopausal   Other Topics Concern  . Not on file   Social History Narrative   Married. Patient does not exercise. She has a Gaffer.    ROS:  Pertinent items are noted in HPI.  PHYSICAL EXAMINATION:    BP 118/78 (BP Location: Right Arm, Patient Position: Sitting, Cuff Size: Normal)   Pulse 76   Ht 4' 11"  (1.499 m)   Wt 184 lb 3.2 oz (83.6 kg)   LMP 05/07/2005 (Approximate)   BMI 37.20 kg/m     General appearance: alert, cooperative and appears stated age Head: Normocephalic, without obvious abnormality, atraumatic Neck: no adenopathy, supple, symmetrical, trachea midline and thyroid normal to inspection and palpation Lungs: clear to auscultation bilaterally   Heart: regular rate and rhythm Abdomen: incision(s):Yes.  , ___vertical midline incision_________  soft, non-tender, no masses,  no organomegaly Extremities: extremities normal, atraumatic, no cyanosis or edema Skin: Skin color, texture, turgor normal. No rashes or lesions Lymph nodes: Cervical, supraclavicular, and axillary nodes normal. No abnormal inguinal nodes palpated Neurologic: Grossly normal  Pelvic: External genitalia:  no lesions              Urethra:  normal appearing urethra with no masses, tenderness or lesions              Bartholins and Skenes: normal                 Vagina: normal appearing vagina with normal color and discharge, no lesions              Cervix: no lesions      Bimanual Exam:  Uterus:  normal size, contour, position, consistency, mobility, non-tender              Adnexa: no mass, fullness, tenderness           Chaperone was present for exam.  ASSESSMENT  Postmenopausal bleeding.  Polypoid and proliferative endometrium. Hx aortic bypass.  DM. Smoker. Hx sleep apnea.  PLAN  Discussion of postmenopausal bleeding. Discussion of hysteroscopy with Myosure polypectomy, dilation and curettage.  Risks, benefits, and alternatives reviewed. Risks include but are not limited to bleeding, infection, damage to surrounding organs including uterine perforation requiring hospitalization and laparoscopy, reaction to anesthesia, DVT,  PE, death, need for further treatment, hysterectomy or medical therapy.   Surgical expectations and recovery discussed.  Patient wishes to proceed. Will have preop clearance from Dr. Brigitte Pulse.  Will need anesthesia preop appt.    An After Visit Summary was printed and given to the patient.  _25_____ minutes face to face time of which over 50% was spent in counseling.

## 2015-11-30 NOTE — Patient Instructions (Signed)
We will call to get authorization to proceed forward with a hysteroscopy with polyp removal and dilation and curettage.

## 2015-12-01 ENCOUNTER — Encounter: Payer: Self-pay | Admitting: Obstetrics and Gynecology

## 2015-12-01 ENCOUNTER — Telehealth: Payer: Self-pay | Admitting: *Deleted

## 2015-12-01 DIAGNOSIS — N950 Postmenopausal bleeding: Secondary | ICD-10-CM | POA: Insufficient documentation

## 2015-12-02 NOTE — Telephone Encounter (Signed)
Call to patient regarding surgery planning. Left message to call back.

## 2015-12-06 NOTE — Telephone Encounter (Signed)
Called patient to review benefits for a recommended procedure. Left Voicemail requesting a call back. °

## 2015-12-07 NOTE — Telephone Encounter (Signed)
Call to patient to discuss surgery date options. Patient requests to schedule toward end of the month. States she last saw PCP 1-2 months ago. Has not contacted them regarding surgical clearance. Advised will schedule surgery and call her back with confirmation and instructions regarding surgery clearance.

## 2015-12-08 NOTE — Telephone Encounter (Signed)
Called patient and went over pre-opt instructions. I also mailed her a copy of what we went over.She was advised to be at [facility] for her surgery on 12-27-15 @ 7:30 am.  She voiced understanding. She is scheduled for 01-11-16 @ 2:30pm with Dr. Quincy Simmonds for her 2 week post opt. Patient agreed to this date and time.

## 2015-12-11 ENCOUNTER — Telehealth: Payer: Self-pay | Admitting: Family Medicine

## 2015-12-11 NOTE — Telephone Encounter (Signed)
Ob/gyn nurse sent staff message requesting surgical clearance prior to pts D&C/hysteroscopy. scheduled for 12/27/15. Diane Giles is at average surgical risk.  Her hemoglobin a1c is 6.4 1 mo prior and her aortic bypass was in 2000. Since that time she has successfully undergone multiple surgical procedures.  Her health status has not appreciably changed since she underwent Rt hip replacement 18 mos prior. HTN well controlled. Pt is not on any anticoagulants, fine to hold asa for 5d prior to surgery. I would advise repeating her EKG prior rot surgery to ensure still normal. Dr. Brigitte Pulse

## 2015-12-13 ENCOUNTER — Telehealth: Payer: Self-pay | Admitting: Obstetrics and Gynecology

## 2015-12-13 NOTE — Telephone Encounter (Signed)
Patient is wanting to cancel surgery scheduled for Aug 22.

## 2015-12-13 NOTE — Telephone Encounter (Signed)
I do recommend procedure.  Patient has postmenopausal bleeding and an expected polyp.   I would see about the possibility of an outpatient procedure at [facility].

## 2015-12-13 NOTE — Telephone Encounter (Signed)
Call to patient. States she is canceling her surgery due to finiancal cost at hospital. Discussed importance of thorough and complete evaluation of PMB to rule out precancerous or even cancerous cells. Patient states she "understands all that" but cannot proceed. Advised that this is really not an optional procedure and will review with Dr Quincy Simmonds for additional recommendations. Also reminded that hospital does make payment arrangements.  Patient states "it may not be the best decision but it is the only decision."   Please advise.

## 2015-12-14 NOTE — Telephone Encounter (Signed)
Kim from the pre-surgery center called to confirm the patient's surgery is cancelled.

## 2015-12-14 NOTE — Telephone Encounter (Signed)
Call to patient. Reinforced recommendations from Dr Quincy Simmonds for procedure. Clarification provided from business office. Patient agreeable to proceed as scheduled. Called back to [facility] as [facility] to have her provide information to patient regarding account.   Routing to provider for final review. Patient agreeable to disposition. Will close encounter.

## 2015-12-14 NOTE — Telephone Encounter (Signed)
Call from [place] at Pre services center. Clarified patients OOP cost and they are willing to bill patient with no upfront cost.  Call to patient, left message to call back.

## 2015-12-15 ENCOUNTER — Encounter (HOSPITAL_COMMUNITY)
Admission: RE | Admit: 2015-12-15 | Discharge: 2015-12-15 | Disposition: A | Discharge status: Home / Self Care | Payer: Commercial Managed Care - HMO | Source: Ambulatory Visit | Attending: Obstetrics and Gynecology | Admitting: Obstetrics and Gynecology

## 2015-12-15 ENCOUNTER — Other Ambulatory Visit: Payer: Self-pay

## 2015-12-15 ENCOUNTER — Encounter (HOSPITAL_COMMUNITY): Payer: Self-pay

## 2015-12-15 DIAGNOSIS — Z01812 Encounter for preprocedural laboratory examination: Secondary | ICD-10-CM | POA: Insufficient documentation

## 2015-12-15 HISTORY — DX: Type 2 diabetes mellitus without complications: E11.9

## 2015-12-15 LAB — BASIC METABOLIC PANEL
Anion gap: 9 (ref 5–15)
BUN: 20 mg/dL (ref 6–20)
CALCIUM: 9.2 mg/dL (ref 8.9–10.3)
CHLORIDE: 96 mmol/L — AB (ref 101–111)
CO2: 30 mmol/L (ref 22–32)
CREATININE: 1.08 mg/dL — AB (ref 0.44–1.00)
GFR, EST NON AFRICAN AMERICAN: 54 mL/min — AB (ref >60)
Glucose, Bld: 124 mg/dL — ABNORMAL HIGH (ref 65–99)
Potassium: 3.2 mmol/L — ABNORMAL LOW (ref 3.5–5.1)
SODIUM: 135 mmol/L (ref 135–145)

## 2015-12-15 LAB — CBC
HCT: 39.9 % (ref 36.0–46.0)
Hemoglobin: 13.6 g/dL (ref 12.0–15.0)
MCH: 31.4 pg (ref 26.0–34.0)
MCHC: 34.1 g/dL (ref 30.0–36.0)
MCV: 92.1 fL (ref 78.0–100.0)
PLATELETS: 370 10*3/uL (ref 150–400)
RBC: 4.33 MIL/uL (ref 3.87–5.11)
RDW: 14 % (ref 11.5–15.5)
WBC: 14.5 10*3/uL — AB (ref 4.0–10.5)

## 2015-12-15 NOTE — Patient Instructions (Signed)
Your procedure is scheduled on:12/27/15  Enter through the Main Entrance at :7:30 am Pick up desk phone and dial [phone] and inform us of your arrival.  Please call [phone] if you have any problems the morning of surgery.  Remember: Do not eat food or drink liquids, including water, after midnight:Monday   You may brush your teeth the morning of surgery.  Take these meds the morning of surgery with a sip of water:Atenolol- Chlorthalidone  DO NOT wear jewelry, eye make-up, lipstick,body lotion, or dark fingernail polish.  (Polished toes are ok) You may wear deodorant.   Patients discharged on the day of surgery will not be allowed to drive home. Wear loose fitting, comfortable clothes for your ride home.

## 2015-12-15 NOTE — Pre-Procedure Instructions (Signed)
CBC and BMP results routed to Dr. Quincy Simmonds

## 2015-12-16 ENCOUNTER — Other Ambulatory Visit: Payer: Self-pay | Admitting: Obstetrics and Gynecology

## 2015-12-16 MED ORDER — POTASSIUM CHLORIDE ER 20 MEQ PO TBCR: 20.0000 meq | EXTENDED_RELEASE_TABLET | Freq: Every day | ORAL | 0 refills | Status: DC

## 2015-12-20 ENCOUNTER — Encounter: Payer: Self-pay | Admitting: Family Medicine

## 2015-12-23 ENCOUNTER — Telehealth: Payer: Self-pay | Admitting: *Deleted

## 2015-12-23 NOTE — Telephone Encounter (Signed)
-----   Message from Shawnee Knapp, MD sent at 12/11/2015 12:51 PM EDT ----- Regarding: RE: Surgery clearance Ms. Diane Giles is at average surgical risk.  Her hemoglobin a1c is 6.4 1 mo prior and her aortic bypass was in 2000. Since that time she has successfully undergone multiple surgical procedures.  Her health status has not appreciably changed since she underwent hip replacement 18 mos prior. I would advise repeating her EKG prior rot surgery to ensure still normal. Dr. Brigitte Pulse ----- Message ----- From: Huey Romans, RN Sent: 12/07/2015   4:53 PM To: Shawnee Knapp, MD Subject: Surgery clearance                              Dr Brigitte Pulse,  Dr Quincy Simmonds is planning a Hysteroscopy/Myosure with polyp resection and D&C for this patient due to PMB and polypoid endometrium. Due to her aortic bypass and elevated blood glucose, we need clearance for her surgery. She was last seen in your office June 2017. Would you like her to come back in for an office visit to be cleared? Projected surgery date is 12-27-15 at [facility].   Thank you,  Lamont Snowball, RN

## 2015-12-23 NOTE — Telephone Encounter (Signed)
Surgery clearance. Copy faxed to [facility] department.   Routing to provider for final review.  Will close encounter.

## 2015-12-27 ENCOUNTER — Encounter (HOSPITAL_COMMUNITY)
Admission: RE | Disposition: A | Discharge status: Home / Self Care | Payer: Self-pay | Source: Ambulatory Visit | Attending: Obstetrics and Gynecology

## 2015-12-27 ENCOUNTER — Encounter (HOSPITAL_COMMUNITY): Payer: Self-pay

## 2015-12-27 ENCOUNTER — Ambulatory Visit (HOSPITAL_COMMUNITY): Payer: Commercial Managed Care - HMO | Admitting: Anesthesiology

## 2015-12-27 ENCOUNTER — Ambulatory Visit (HOSPITAL_COMMUNITY)
Admission: RE | Admit: 2015-12-27 | Discharge: 2015-12-27 | Disposition: A | Discharge status: Home / Self Care | Payer: Commercial Managed Care - HMO | Source: Ambulatory Visit | Attending: Obstetrics and Gynecology | Admitting: Obstetrics and Gynecology

## 2015-12-27 DIAGNOSIS — E119 Type 2 diabetes mellitus without complications: Secondary | ICD-10-CM | POA: Insufficient documentation

## 2015-12-27 DIAGNOSIS — N950 Postmenopausal bleeding: Secondary | ICD-10-CM | POA: Diagnosis not present

## 2015-12-27 DIAGNOSIS — I10 Essential (primary) hypertension: Secondary | ICD-10-CM | POA: Insufficient documentation

## 2015-12-27 DIAGNOSIS — N840 Polyp of corpus uteri: Secondary | ICD-10-CM | POA: Insufficient documentation

## 2015-12-27 DIAGNOSIS — R938 Abnormal findings on diagnostic imaging of other specified body structures: Secondary | ICD-10-CM | POA: Diagnosis not present

## 2015-12-27 DIAGNOSIS — G4733 Obstructive sleep apnea (adult) (pediatric): Secondary | ICD-10-CM | POA: Insufficient documentation

## 2015-12-27 DIAGNOSIS — Z7984 Long term (current) use of oral hypoglycemic drugs: Secondary | ICD-10-CM | POA: Diagnosis not present

## 2015-12-27 DIAGNOSIS — F17210 Nicotine dependence, cigarettes, uncomplicated: Secondary | ICD-10-CM | POA: Insufficient documentation

## 2015-12-27 DIAGNOSIS — Z885 Allergy status to narcotic agent status: Secondary | ICD-10-CM | POA: Insufficient documentation

## 2015-12-27 DIAGNOSIS — Z96641 Presence of right artificial hip joint: Secondary | ICD-10-CM | POA: Insufficient documentation

## 2015-12-27 HISTORY — PX: DILATATION & CURETTAGE/HYSTEROSCOPY WITH MYOSURE: SHX6511

## 2015-12-27 LAB — GLUCOSE, CAPILLARY
Glucose-Capillary: 136 mg/dL — ABNORMAL HIGH (ref 65–99)
Glucose-Capillary: 108 mg/dL — ABNORMAL HIGH (ref 65–99)

## 2015-12-27 SURGERY — DILATATION & CURETTAGE/HYSTEROSCOPY WITH MYOSURE
Anesthesia: General | Site: Vagina

## 2015-12-27 MED ORDER — LIDOCAINE HCL 1 % IJ SOLN
INTRAMUSCULAR | Status: DC | PRN
Start: 2015-12-27 — End: 2015-12-27
  Administered 2015-12-27: 10 mL

## 2015-12-27 MED ORDER — HYDROMORPHONE HCL 1 MG/ML IJ SOLN: 0.2500 mg | INTRAMUSCULAR | Status: DC | PRN

## 2015-12-27 MED ORDER — ONDANSETRON HCL 4 MG/2ML IJ SOLN
INTRAMUSCULAR | Status: DC | PRN
  Administered 2015-12-27: 4 mg via INTRAVENOUS

## 2015-12-27 MED ORDER — DEXAMETHASONE SODIUM PHOSPHATE 4 MG/ML IJ SOLN
INTRAMUSCULAR | Status: DC | PRN
  Administered 2015-12-27: 4 mg via INTRAVENOUS

## 2015-12-27 MED ORDER — SCOPOLAMINE 1 MG/3DAYS TD PT72
MEDICATED_PATCH | TRANSDERMAL | Status: AC
  Administered 2015-12-27: 1.5 mg via TRANSDERMAL
  Filled 2015-12-27: qty 1

## 2015-12-27 MED ORDER — SODIUM CHLORIDE 0.9 % IR SOLN
Status: DC | PRN
  Administered 2015-12-27: 3000 mL

## 2015-12-27 MED ORDER — SCOPOLAMINE 1 MG/3DAYS TD PT72
1.0000 | MEDICATED_PATCH | Freq: Once | TRANSDERMAL | Status: DC
  Administered 2015-12-27: 1.5 mg via TRANSDERMAL

## 2015-12-27 MED ORDER — KETOROLAC TROMETHAMINE 30 MG/ML IJ SOLN
INTRAMUSCULAR | Status: DC | PRN
  Administered 2015-12-27: 30 mg via INTRAVENOUS

## 2015-12-27 MED ORDER — MIDAZOLAM HCL 2 MG/2ML IJ SOLN
INTRAMUSCULAR | Status: AC
  Filled 2015-12-27: qty 2

## 2015-12-27 MED ORDER — MIDAZOLAM HCL 5 MG/5ML IJ SOLN
INTRAMUSCULAR | Status: DC | PRN
  Administered 2015-12-27: 2 mg via INTRAVENOUS

## 2015-12-27 MED ORDER — DEXAMETHASONE SODIUM PHOSPHATE 4 MG/ML IJ SOLN
INTRAMUSCULAR | Status: AC
  Filled 2015-12-27: qty 1

## 2015-12-27 MED ORDER — LACTATED RINGERS IV SOLN: INTRAVENOUS | Status: DC

## 2015-12-27 MED ORDER — LIDOCAINE HCL (CARDIAC) 20 MG/ML IV SOLN
INTRAVENOUS | Status: DC | PRN
  Administered 2015-12-27: 100 mg via INTRAVENOUS

## 2015-12-27 MED ORDER — ONDANSETRON HCL 4 MG/2ML IJ SOLN: 4.0000 mg | Freq: Once | INTRAMUSCULAR | Status: DC | PRN

## 2015-12-27 MED ORDER — FENTANYL CITRATE (PF) 100 MCG/2ML IJ SOLN
INTRAMUSCULAR | Status: AC
  Filled 2015-12-27: qty 2

## 2015-12-27 MED ORDER — LACTATED RINGERS IV SOLN
INTRAVENOUS | Status: DC
  Administered 2015-12-27 (×2): via INTRAVENOUS

## 2015-12-27 MED ORDER — PROPOFOL 10 MG/ML IV BOLUS
INTRAVENOUS | Status: AC
  Filled 2015-12-27: qty 20

## 2015-12-27 MED ORDER — FENTANYL CITRATE (PF) 100 MCG/2ML IJ SOLN
INTRAMUSCULAR | Status: DC | PRN
  Administered 2015-12-27 (×2): 25 ug via INTRAVENOUS
  Administered 2015-12-27: 50 ug via INTRAVENOUS

## 2015-12-27 MED ORDER — PROPOFOL 10 MG/ML IV BOLUS
INTRAVENOUS | Status: DC | PRN
  Administered 2015-12-27: 150 mg via INTRAVENOUS

## 2015-12-27 MED ORDER — ONDANSETRON HCL 4 MG/2ML IJ SOLN
INTRAMUSCULAR | Status: AC
  Filled 2015-12-27: qty 2

## 2015-12-27 MED ORDER — MEPERIDINE HCL 25 MG/ML IJ SOLN: 6.2500 mg | INTRAMUSCULAR | Status: DC | PRN

## 2015-12-27 MED ORDER — LIDOCAINE HCL 1 % IJ SOLN
INTRAMUSCULAR | Status: AC
  Filled 2015-12-27: qty 20

## 2015-12-27 MED ORDER — IBUPROFEN 800 MG PO TABS: 800.0000 mg | ORAL_TABLET | Freq: Three times a day (TID) | ORAL | 0 refills | Status: DC | PRN

## 2015-12-27 SURGICAL SUPPLY — 21 items
CANISTER SUCT 3000ML (MISCELLANEOUS) ×3 IMPLANT
CATH ROBINSON RED A/P 16FR (CATHETERS) ×3 IMPLANT
CLOTH BEACON ORANGE TIMEOUT ST (SAFETY) ×3 IMPLANT
CONTAINER PREFILL 10% NBF 60ML (FORM) ×4 IMPLANT
DEVICE MYOSURE LITE (MISCELLANEOUS) IMPLANT
DEVICE MYOSURE REACH (MISCELLANEOUS) IMPLANT
ELECT REM PT RETURN 9FT ADLT (ELECTROSURGICAL)
ELECTRODE REM PT RTRN 9FT ADLT (ELECTROSURGICAL) IMPLANT
FILTER ARTHROSCOPY CONVERTOR (FILTER) ×3 IMPLANT
GLOVE BIO SURGEON STRL SZ 6.5 (GLOVE) ×2 IMPLANT
GLOVE BIO SURGEONS STRL SZ 6.5 (GLOVE) ×1
GLOVE BIOGEL PI IND STRL 7.0 (GLOVE) ×2 IMPLANT
GLOVE BIOGEL PI INDICATOR 7.0 (GLOVE) ×4
GOWN STRL REUS W/TWL LRG LVL3 (GOWN DISPOSABLE) ×6 IMPLANT
PACK VAGINAL MINOR WOMEN LF (CUSTOM PROCEDURE TRAY) ×3 IMPLANT
PAD OB MATERNITY 4.3X12.25 (PERSONAL CARE ITEMS) ×3 IMPLANT
SEAL ROD LENS SCOPE MYOSURE (ABLATOR) ×3 IMPLANT
TOWEL OR 17X24 6PK STRL BLUE (TOWEL DISPOSABLE) ×6 IMPLANT
TUBING AQUILEX INFLOW (TUBING) ×3 IMPLANT
TUBING AQUILEX OUTFLOW (TUBING) ×3 IMPLANT
WATER STERILE IRR 1000ML POUR (IV SOLUTION) ×3 IMPLANT

## 2015-12-27 NOTE — Transfer of Care (Signed)
Immediate Anesthesia Transfer of Care Note  Patient: Diane Giles  Procedure(s) Performed: Procedure(s): DILATATION & CURETTAGE/HYSTEROSCOPY (N/A)  Patient Location: PACU  Anesthesia Type:General  Level of Consciousness: awake, alert  and oriented  Airway & Oxygen Therapy: Patient Spontanous Breathing and Patient connected to nasal cannula oxygen  Post-op Assessment: Report given to RN and Post -op Vital signs reviewed and stable  Post vital signs: Reviewed and stable  Last Vitals:  Vitals:   12/27/15 0801 12/27/15 1012  BP: 114/72 (!) 130/51  Pulse: 79 85  Temp: 36.6 C (P) 36.9 C    Last Pain:  Vitals:   12/27/15 0801  TempSrc: Oral         Complications: No apparent anesthesia complications

## 2015-12-27 NOTE — Anesthesia Preprocedure Evaluation (Signed)
Anesthesia Evaluation  Patient identified by MRN, date of birth, ID band Patient awake    Reviewed: Allergy & Precautions, NPO status , Patient's Chart, lab work & pertinent test results  Airway Mallampati: I  TM Distance: >3 FB Neck ROM: Full    Dental   Pulmonary sleep apnea , Current Smoker,    Pulmonary exam normal        Cardiovascular hypertension, Pt. on medications + Peripheral Vascular Disease  Normal cardiovascular exam     Neuro/Psych    GI/Hepatic   Endo/Other  diabetes, Type 2  Renal/GU      Musculoskeletal   Abdominal   Peds  Hematology   Anesthesia Other Findings   Reproductive/Obstetrics                             Anesthesia Physical Anesthesia Plan  ASA: III  Anesthesia Plan: General   Post-op Pain Management:    Induction: Intravenous  Airway Management Planned: LMA  Additional Equipment:   Intra-op Plan:   Post-operative Plan: Extubation in OR  Informed Consent: I have reviewed the patients History and Physical, chart, labs and discussed the procedure including the risks, benefits and alternatives for the proposed anesthesia with the patient or authorized representative who has indicated his/her understanding and acceptance.     Plan Discussed with: CRNA and Surgeon  Anesthesia Plan Comments:         Anesthesia Quick Evaluation

## 2015-12-27 NOTE — Brief Op Note (Signed)
12/27/2015  10:09 AM  PATIENT:  Diane Giles  60 y.o. female  PRE-OPERATIVE DIAGNOSIS:  Post Menpausal Bleeding, polypoid endometium   POST-OPERATIVE DIAGNOSIS:  Post Menpausal Bleeding, polypoid endometium   PROCEDURE:  HYSTEROSCOPY WITH DILATION AND CURETTAGE.  SURGEON:  Surgeon(s) and Role:    * Jubal Rademaker E Yisroel Ramming, MD - Primary  PHYSICIAN ASSISTANT: NA  ASSISTANTS: none   ANESTHESIA:   paracervical block and LMA.  EBL:  Total I/O In: -  Out: 55 [Urine:50; Blood:5]  Normal saline deficit:  85 cc.   BLOOD ADMINISTERED:none  DRAINS: none   LOCAL MEDICATIONS USED:  LIDOCAINE   SPECIMEN:  Source of Specimen:  endometrial curettings  DISPOSITION OF SPECIMEN:  PATHOLOGY  COUNTS:  YES  TOURNIQUET:  * No tourniquets in log *  DICTATION: .Note written in EPIC  PLAN OF CARE: Discharge to home after PACU  PATIENT DISPOSITION:  PACU - hemodynamically stable.   Delay start of Pharmacological VTE agent (>24hrs) due to surgical blood loss or risk of bleeding: not applicable

## 2015-12-27 NOTE — H&P (Signed)
Office Visit   11/30/2015 Aris[place], MD  Obstetrics and Gynecology   Postmenopausal bleeding  Dx   Follow-up ; Referred by Shawnee Knapp, MD  Reason for Visit   Additional Documentation   Vitals:   BP 118/78 (BP Location: Right Arm, Patient Position: Sitting, Cuff Size: Normal)   Pulse 76   Ht _0  (1.499 m)   Wt 184 lb 3.2 oz (83.6 kg)   LMP 05/07/2005 (Approximate)   BMI 37.20 kg/m   BSA 1.87 m   Flowsheets:   Custom Formula Data,   MEWS Score,   Anthropometrics     Encounter Info:   Billing Info,   History,   Allergies,   Detailed Report     All Notes   Progress Notes by Nunzio Cobbs, MD at 11/30/2015 9:30 AM   Author: Nunzio Cobbs, MD Author Type: Physician Filed: 12/01/2015 5:51 AM  Note Status: Signed Cosign: Cosign Not Required Encounter Date: 11/30/2015  Editor: Nunzio Cobbs, MD (Physician)  Prior Versions: 1. Lowella Fairy, CMA (Certified Medical Assistant) at 11/30/2015 9:54 AM - Sign at close encounter    GYNECOLOGY  VISIT   HPI: 60 y.o.   Married  Caucasian  female   [phone] with Patient's last menstrual period was 05/07/2005 (approximate).   here for follow up after endometrial biopsy and recent postmenopausal bleeding.    History of thickened endometrium noted on ultrasound by outside provider. EMB in follow up showed proliferative endometrium.  Was treated with 3 months of Provera for proliferative endometrium which did not produce any change. Had follow up EMB showing proliferative and polypoid endometrium.  Rx for Prometrium given but had spontaneous vaginal bleeding prior to starting Prometrium.  Bleeding has now stopped. She was told it was ok to start the Prometrium. No side effects except night sweats.   Hx of aortic bypass in 2000. Surgery was done here in [place]. No cardiologist.  Hx elevated blood sugar.  Uses tobacco.   Right hip arthroplasty in  2016.   PCP is Dr. Brigitte Pulse.  GYNECOLOGIC HISTORY: Patient's last menstrual period was 05/07/2005 (approximate). Contraception:  Postmenopausal Menopausal hormone therapy:  Prometrium 249m Last mammogram:  10/2015 normal per patient:[facility] Last pap smear: 05/14/14 Neg. HR HPV:neg                  OB History    Gravida Para Term Preterm AB Living   _1 SAB TAB Ectopic Multiple Live Births                         Patient Active Problem List   Diagnosis Date Noted  . Endometrial thickening on ultra sound 07/28/2015  . 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 Type 2 diabetes mellitus ([facility] 05/18/2014  . Diverticulitis 08/26/2013  . Leukocytosis 08/26/2013  . Abdominal pain 08/26/2013  . DDD (degenerative disc disease) 03/18/2013  . Metabolic syndrome 031/51/7616 . Vitamin D deficiency 07/29/2012  . Essential hypertension, benign 07/18/2012  . Hyperlipidemia 07/18/2012  . Obstructive sleep apnea 07/04/2012  . Osteoporosis 07/04/2012  . Morbid obesity ([facility] 07/04/2012  . Tobacco use disorder 07/04/2012  . Trochanteric bursitis of right hip 07/04/2012        Past Medical History:  Diagnosis Date  . Arthritis   . Diverticulitis   . Hypertension   .  Osteoporosis   . Sleep apnea          Past Surgical History:  Procedure Laterality Date  . ELBOW SURGERY Left ~2007   tendon repair by Dr. Veverly Fells  . EYE SURGERY Bilateral 2008   lasik  . lower aortic bypass  2000   per patient  . TOTAL HIP ARTHROPLASTY Right 07/27/2014   Procedure: RIGHT TOTAL HIP ARTHROPLASTY ANTERIOR APPROACH;  Surgeon: Paralee Cancel, MD;  Location: [facility];  Service: Orthopedics;  Laterality: Right;          Current Outpatient Prescriptions  Medication Sig Dispense Refill  . atenolol-chlorthalidone (TENORETIC) 50-25 MG tablet Take 1 tablet by mouth daily. 90 tablet 3  . atorvastatin (LIPITOR) 40 MG tablet Take 1 tablet (40 mg total) by mouth daily at 6 PM. 90 tablet 3  .  blood glucose meter kit and supplies Dispense based on patient and insurance preference. Use as directed once a day. (DX: R73.9) 1 each 0  . furosemide (LASIX) 20 MG tablet Take 1 tablet (20 mg total) by mouth daily. 90 tablet 3  . glucose blood (GLUCOSE METER TEST) test strip Use as instructed once a day. (DX:R73.9) 50 each 2  . Lancets MISC Use as directed once a day. (DX: R73.9) 100 each 2  . metFORMIN (GLUCOPHAGE) 500 MG tablet Take 1 tablet (500 mg total) by mouth 2 (two) times daily with a meal. 180 tablet 3  . Omega-3 Fatty Acids (OMEGA 3 PO) Take 1 capsule by mouth 2 (two) times daily.    . progesterone (PROMETRIUM) 200 MG capsule Take 1 capsule (200 mg total) by mouth at bedtime. 90 capsule 0   No current facility-administered medications for this visit.      ALLERGIES: Codeine        Family History  Problem Relation Age of Onset  . Cancer Father 54    Dec age 52 with pancreatic Ca  . Migraines Mother   . Thyroid disease Mother     hypothyroid    Social History        Social History  . Marital status: Married    Spouse name: N/A  . Number of children: N/A  . Years of education: N/A       Occupational History  . Art gallery manager          Social History Main Topics  . Smoking status: Current Every Day Smoker    Packs/day: 0.50    Years: 40.00    Types: Cigarettes  . Smokeless tobacco: Never Used  . Alcohol use 0.0 oz/week     Comment: very rare--1/month  . Drug use: No  . Sexual activity: Yes    Partners: Male    Birth control/ protection: Post-menopausal       Other Topics Concern  . Not on file      Social History Narrative   Married. Patient does not exercise. She has a Gaffer.    ROS:  Pertinent items are noted in HPI.  PHYSICAL EXAMINATION:    BP 118/78 (BP Location: Right Arm, Patient Position: Sitting, Cuff Size: Normal)   Pulse 76   Ht _0  (1.499 m)   Wt 184 lb 3.2 oz (83.6 kg)   LMP  05/07/2005 (Approximate)   BMI 37.20 kg/m     General appearance: alert, cooperative and appears stated age Head: Normocephalic, without obvious abnormality, atraumatic Neck: no adenopathy, supple, symmetrical, trachea midline and thyroid normal to inspection and palpation Lungs: clear to  auscultation bilaterally   Heart: regular rate and rhythm Abdomen: incision(s):Yes.  , ___vertical midline incision_________  soft, non-tender, no masses,  no organomegaly Extremities: extremities normal, atraumatic, no cyanosis or edema Skin: Skin color, texture, turgor normal. No rashes or lesions Lymph nodes: Cervical, supraclavicular, and axillary nodes normal. No abnormal inguinal nodes palpated Neurologic: Grossly normal  Pelvic: External genitalia:  no lesions              Urethra:  normal appearing urethra with no masses, tenderness or lesions              Bartholins and Skenes: normal                 Vagina: normal appearing vagina with normal color and discharge, no lesions              Cervix: no lesions      Bimanual Exam:  Uterus:  normal size, contour, position, consistency, mobility, non-tender              Adnexa: no mass, fullness, tenderness          Chaperone was present for exam.  ASSESSMENT  Postmenopausal bleeding.  Polypoid and proliferative endometrium. Hx aortic bypass.  DM. Smoker. Hx sleep apnea.  PLAN  Discussion of postmenopausal bleeding. Discussion of hysteroscopy with Myosure polypectomy, dilation and curettage.  Risks, benefits, and alternatives reviewed. Risks include but are not limited to bleeding, infection, damage to surrounding organs including uterine perforation requiring hospitalization and laparoscopy, reaction to anesthesia, DVT, PE, death, need for further treatment, hysterectomy or medical therapy.   Surgical expectations and recovery discussed.  Patient wishes to proceed. Will have preop clearance from Dr. Brigitte Pulse.  Will need anesthesia  preop appt.    An After Visit Summary was printed and given to the patient.  _25_____ minutes face to face time of which over 50% was spent in counseling.

## 2015-12-27 NOTE — Op Note (Signed)
OPERATIVE REPORT   PREOPERATIVE DIAGNOSES:   Postmenopausal bleeding, polypoid endometrium.  POSTOPERATIVE DIAGNOSES:   Postmenopausal bleeding, polypoid endometrium.  PROCEDURE:  Hysteroscopy with dilation and curettage    SURGEON:  Lenard Galloway, MD  ANESTHESIA:  LMA, paracervical block with 10 mL of 1% lidocaine.  IV FLUIDS:     EBL:  5 cc.  URINE OUTPUT:  50 cc.  NORMAL SALINE DEFICIT:  85 cc.  COMPLICATIONS:  None.  INDICATIONS FOR THE PROCEDURE:    The patient is a 60 year old P94 Caucasian female with thickened endometrium and benign proliferative change noted on endometrial biopsy.  The patient was treated with Provera for 3 months and had repeat endometrial biopsy showing no change.  She then developed spontaneous postmenopausal bleeding.  A plan is now made to proceed with a hysteroscopy with dilation and curettage, after risks, benefits and alternatives were reviewed.  FINDINGS:  Exam under anesthesia revealed a small midposition uterus.  The uterus was sounded to 8 cm. Hysteroscopy showed no intracavitary masses.  Endometrial currettings were scant.  SPECIMENS:  Endometrial curettings were sent to Pathology.  PROCEDURE IN DETAIL:  The patient was reidentified in the preoperative hold area.  She received TED hose and PAS stockings for DVT prophylaxis.  In the operating room, the patient was placed in the dorsal lithotomy position and then an LMA anesthetic was introduced.  The patient's lower abdomen, vagina and perineum were sterilely prepped with Betadine and the  patient's bladder was catheterized of urine.  She was sterilly draped  An exam under anesthesia was performed.  A speculum was placed inside the vagina and a single-tooth tenaculum was placed on the anterior cervical lip.  A paracervical block was performed with a total of 10 mL of 1% lidocaine plain.  The uterus was sounded. The cervix was dilated to a #23 Pratt  dilator.  The MyoSure hysteroscope was then inserted inside the uterine cavity under the continuous infusion of normal saline solution.  Findings are as noted above.  The MyoSure hysteroscope was removed.  The sharp and serrated curettes were introduced  into the uterine cavity and the endometrium was curetted in all 4 quadrants.  A minimal amount of endometrial curettings was obtained.  This specimen was sent to Pathology.  The single-tooth tenaculum which had been placed on the anterior cervical lip was removed.  Hemostasis was good, and all of the vaginal instruments  were removed.  The patient was awakened and escorted to the recovery room in stable condition after she was cleansed with Betadine.  There were no complications to the procedure.  All needle, instrument and sponge counts were correct.  Lenard Galloway, MD

## 2015-12-27 NOTE — Anesthesia Postprocedure Evaluation (Signed)
Anesthesia Post Note  Patient: Diane Giles  Procedure(s) Performed: Procedure(s) (LRB): DILATATION & CURETTAGE/HYSTEROSCOPY (N/A)  Patient location during evaluation: PACU Anesthesia Type: General Level of consciousness: awake and alert Pain management: pain level controlled Vital Signs Assessment: post-procedure vital signs reviewed and stable Respiratory status: spontaneous breathing, nonlabored ventilation, respiratory function stable and patient connected to nasal cannula oxygen Cardiovascular status: blood pressure returned to baseline and stable Postop Assessment: no signs of nausea or vomiting Anesthetic complications: no     Last Vitals:  Vitals:   12/27/15 1115 12/27/15 1130  BP: (!) 123/58 113/65  Pulse: 74 65  Resp: 10 17  Temp:  36.7 C    Last Pain:  Vitals:   12/27/15 0801  TempSrc: Oral   Pain Goal: Patients Stated Pain Goal: 5 (12/27/15 1130)               Cherise Fedder DAVID

## 2015-12-27 NOTE — Anesthesia Procedure Notes (Signed)
Procedure Name: LMA Insertion Date/Time: 12/27/2015 9:42 AM Performed by: Bufford Spikes Pre-anesthesia Checklist: Patient identified, Emergency Drugs available, Suction available, Patient being monitored and Timeout performed Patient Re-evaluated:Patient Re-evaluated prior to inductionOxygen Delivery Method: Circle system utilized Preoxygenation: Pre-oxygenation with 100% oxygen Intubation Type: IV induction Ventilation: Mask ventilation without difficulty LMA: LMA inserted LMA Size: 4.0 Tube type: Oral Number of attempts: 1 Placement Confirmation: positive ETCO2,  CO2 detector and breath sounds checked- equal and bilateral Tube secured with: Tape Dental Injury: Teeth and Oropharynx as per pre-operative assessment

## 2015-12-27 NOTE — Progress Notes (Signed)
Update to History and Physical  Patient took potassium supplement due to a low level prior to surgery.  Patient examined.  Ok to proceed with surgery.

## 2015-12-27 NOTE — Discharge Instructions (Signed)
Hysteroscopy, Care After Refer to this sheet in the next few weeks. These instructions provide you with information on caring for yourself after your procedure. Your health care provider may also give you more specific instructions. Your treatment has been planned according to current medical practices, but problems sometimes occur. Call your health care provider if you have any problems or questions after your procedure.  WHAT TO EXPECT AFTER THE PROCEDURE After your procedure, it is typical to have the following:  You may have some cramping. This normally lasts for a couple days.  You may have bleeding. This can vary from light spotting for a few days to menstrual-like bleeding for 3-7 days. HOME CARE INSTRUCTIONS  Rest for the first 1-2 days after the procedure.  Only take over-the-counter or prescription medicines as directed by your health care provider. Do not take aspirin. It can increase the chances of bleeding.  Take showers instead of baths for 2 weeks or as directed by your health care provider.  Do not drive for 24 hours or as directed.  Do not drink alcohol while taking pain medicine.  Do not use tampons, douche, or have sexual intercourse for 2 weeks or until your health care provider says it is okay.  Take your temperature twice a day for 4-5 days. Write it down each time.  Follow your health care provider's advice about diet, exercise, and lifting.  If you develop constipation, you may:  Take a mild laxative if your health care provider approves.  Add bran foods to your diet.  Drink enough fluids to keep your urine clear or pale yellow.  Try to have someone with you or available to you for the first 24-48 hours, especially if you were given a general anesthetic.  Follow up with your health care provider as directed. SEEK MEDICAL CARE IF:  You feel dizzy or lightheaded.  You feel sick to your stomach (nauseous).  You have abnormal vaginal discharge.  You  have a rash.  You have pain that is not controlled with medicine. SEEK IMMEDIATE MEDICAL CARE IF:  You have bleeding that is heavier than a normal menstrual period.  You have a fever.  You have increasing cramps or pain, not controlled with medicine.  You have new belly (abdominal) pain.  You pass out.  You have pain in the tops of your shoulders (shoulder strap areas).  You have shortness of breath.   This information is not intended to replace advice given to you by your health care provider. Make sure you discuss any questions you have with your health care provider.   Document Released: 02/11/2013 Document Reviewed: 02/11/2013 Elsevier Interactive Patient Education 2016 Elsevier Inc.  Dilation and Curettage or Vacuum Curettage, Care After Refer to this sheet in the next few weeks. These instructions provide you with information on caring for yourself after your procedure. Your health care provider may also give you more specific instructions. Your treatment has been planned according to current medical practices, but problems sometimes occur. Call your health care provider if you have any problems or questions after your procedure. WHAT TO EXPECT AFTER THE PROCEDURE After your procedure, it is typical to have light cramping and bleeding. This may last for 2 days to 2 weeks after the procedure. HOME CARE INSTRUCTIONS   Do not drive for 24 hours.  Wait 1 week before returning to strenuous activities.  Take your temperature 2 times a day for 4 days and write it down. Provide these temperatures to  your health care provider if you develop a fever.  Avoid long periods of standing.  Avoid heavy lifting, pushing, or pulling. Do not lift anything heavier than 10 pounds (4.5 kg).  Limit stair climbing to once or twice a day.  Take rest periods often.  You may resume your usual diet.  Drink enough fluids to keep your urine clear or pale yellow.  Your usual bowel function  should return. If you have constipation, you may:  Take a mild laxative with permission from your health care provider.  Add fruit and bran to your diet.  Drink more fluids.  Take showers instead of baths until your health care provider gives you permission to take baths.  Do not go swimming or use a hot tub until your health care provider approves.  Try to have someone with you or available to you the first 24-48 hours, especially if you were given a general anesthetic.  Do not douche, use tampons, or have sex (intercourse) for 2 weeks after the procedure.  Only take over-the-counter or prescription medicines as directed by your health care provider. Do not take aspirin. It can cause bleeding.  Follow up with your health care provider as directed. SEEK MEDICAL CARE IF:   You have increasing cramps or pain that is not relieved with medicine.  You have abdominal pain that does not seem to be related to the same area of earlier cramping and pain.  You have bad smelling vaginal discharge.  You have a rash.  You are having problems with any medicine. SEEK IMMEDIATE MEDICAL CARE IF:   You have bleeding that is heavier than a normal menstrual period.  You have a fever.  You have chest pain.  You have shortness of breath.  You feel dizzy or feel like fainting.  You pass out.  You have pain in your shoulder strap area.  You have heavy vaginal bleeding with or without blood clots. MAKE SURE YOU:   Understand these instructions.  Will watch your condition.  Will get help right away if you are not doing well or get worse.   This information is not intended to replace advice given to you by your health care provider. Make sure you discuss any questions you have with your health care provider.   Document Released: 04/20/2000 Document Revised: 04/28/2013 Document Reviewed: 11/20/2012 Elsevier Interactive Patient Education [employer].

## 2015-12-29 ENCOUNTER — Telehealth: Payer: Self-pay

## 2015-12-29 NOTE — Telephone Encounter (Signed)
Called patient at [phone] to discuss pathology results. Left message to call me back.

## 2015-12-29 NOTE — Telephone Encounter (Signed)
Spoke with patient and notified of results. 

## 2015-12-29 NOTE — Telephone Encounter (Signed)
-----   Message from Nunzio Cobbs, MD sent at 12/29/2015  8:10 AM EDT ----- Please share result of the dilation and curettage surgical specimen which showed a benign endometrial polyp.  No hyperplasia or malignancy were seen.  She does not need to take Prometrium or Provera.  Keep post op appointment.

## 2016-01-03 ENCOUNTER — Encounter (HOSPITAL_COMMUNITY): Payer: Self-pay | Admitting: Obstetrics and Gynecology

## 2016-01-11 ENCOUNTER — Encounter: Payer: Self-pay | Admitting: Obstetrics and Gynecology

## 2016-01-11 ENCOUNTER — Ambulatory Visit (INDEPENDENT_AMBULATORY_CARE_PROVIDER_SITE_OTHER): Payer: Commercial Managed Care - HMO | Admitting: Obstetrics and Gynecology

## 2016-01-11 VITALS — BP 110/60 | HR 76 | Ht 59.0 in | Wt 188.0 lb

## 2016-01-11 DIAGNOSIS — N840 Polyp of corpus uteri: Secondary | ICD-10-CM

## 2016-01-11 DIAGNOSIS — Z9889 Other specified postprocedural states: Secondary | ICD-10-CM

## 2016-01-11 NOTE — Progress Notes (Signed)
GYNECOLOGY  VISIT   HPI: 60 y.o.   Married  Caucasian  female   [phone] with Patient's last menstrual period was 05/07/2005 (approximate).   here for 2 week follow up DILATATION & CURETTAGE/HYSTEROSCOPY (N/A Vagina ).  Did well with surgery.  No pain or bleeding afterward.   Final path - benign endometrial polyp.  No proliferation or hyperplasia.   Will follow up with PCP for potassium recheck.  Had low K level which was treated prior to surgery.  Is still taking KCl.  GYNECOLOGIC HISTORY: Patient's last menstrual period was 05/07/2005 (approximate). Contraception: Postmenopausal Menopausal hormone therapy:  none Last mammogram: 11-16-15 3D/Density A/benign vascular calcifications both breasts/Neg/BiRads2:[facility] Last pap smear:   05-14-14 Neg:Neg HR HPV        OB History    Gravida Para Term Preterm AB Living   _0 SAB TAB Ectopic Multiple Live Births                     Patient Active Problem List   Diagnosis Date Noted  . Postmenopausal bleeding 12/01/2015  . Endometrial thickening on ultra sound 07/28/2015  . 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 Type 2 diabetes mellitus ([place]) 05/18/2014  . Diverticulitis 08/26/2013  . Leukocytosis 08/26/2013  . Abdominal pain 08/26/2013  . DDD (degenerative disc disease) 03/18/2013  . Metabolic syndrome 35/45/6256  . Vitamin D deficiency 07/29/2012  . Essential hypertension, benign 07/18/2012  . Hyperlipidemia 07/18/2012  . Obstructive sleep apnea 07/04/2012  . Osteoporosis 07/04/2012  . Morbid obesity ([place]) 07/04/2012  . Tobacco use disorder 07/04/2012  . Trochanteric bursitis of right hip 07/04/2012    Past Medical History:  Diagnosis Date  . Arthritis   . Diabetes mellitus without complication ([place])    per pt she is pre- diabetic  . Diverticulitis   . Hypertension   . Osteoporosis   . Sleep apnea     Past Surgical History:  Procedure Laterality Date  . DILATATION & CURETTAGE/HYSTEROSCOPY WITH MYOSURE N/A  12/27/2015   Procedure: DILATATION & CURETTAGE/HYSTEROSCOPY;  Surgeon: Nunzio Cobbs, MD;  Location: [facility];  Service: Gynecology;  Laterality: N/A;  . ELBOW SURGERY Left ~2007   tendon repair by Dr. Veverly Fells  . EYE SURGERY Bilateral 2008   lasik  . lower aortic bypass  2000   per patient  . TOTAL HIP ARTHROPLASTY Right 07/27/2014   Procedure: RIGHT TOTAL HIP ARTHROPLASTY ANTERIOR APPROACH;  Surgeon: Paralee Cancel, MD;  Location: [facility];  Service: Orthopedics;  Laterality: Right;    Current Outpatient Prescriptions  Medication Sig Dispense Refill  . aspirin 81 MG chewable tablet Chew 81 mg by mouth daily.    Marland Kitchen atenolol-chlorthalidone (TENORETIC) 50-25 MG tablet Take 1 tablet by mouth daily. 90 tablet 3  . atorvastatin (LIPITOR) 40 MG tablet Take 1 tablet (40 mg total) by mouth daily at 6 PM. 90 tablet 3  . blood glucose meter kit and supplies Dispense based on patient and insurance preference. Use as directed once a day. (DX: R73.9) 1 each 0  . furosemide (LASIX) 20 MG tablet Take 1 tablet (20 mg total) by mouth daily. 90 tablet 3  . glucose blood (GLUCOSE METER TEST) test strip Use as instructed once a day. (DX:R73.9) 50 each 2  . ibuprofen (ADVIL,MOTRIN) 800 MG tablet Take 1 tablet (800 mg total) by mouth every 8 (eight) hours as needed. 30 tablet 0  . Lancets MISC Use  as directed once a day. (DX: R73.9) 100 each 2  . metFORMIN (GLUCOPHAGE) 500 MG tablet Take 1 tablet (500 mg total) by mouth 2 (two) times daily with a meal. 180 tablet 3  . Omega-3 Fatty Acids (OMEGA 3 PO) Take 1 capsule by mouth 2 (two) times daily.    . potassium chloride 20 MEQ TBCR Take 20 mEq by mouth daily. 30 tablet 0   No current facility-administered medications for this visit.      ALLERGIES: Codeine  Family History  Problem Relation Age of Onset  . Cancer Father 71    Dec age 8 with pancreatic Ca  . Migraines Mother   . Thyroid disease Mother     hypothyroid    Social History   Social  History  . Marital status: Married    Spouse name: N/A  . Number of children: N/A  . 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 Smoking status: Current Every Day Smoker    Packs/day: 0.50    Years: 40.00    Types: Cigarettes  . Smokeless tobacco: Never Used  . Alcohol use 0.0 oz/week     Comment: very rare--1/month  . Drug use: No  . Sexual activity: Yes    Partners: Male    Birth control/ protection: Post-menopausal   Other Topics Concern  . Not on file   Social History Narrative   Married. Patient does not exercise. She has a Gaffer.    ROS:  Pertinent items are noted in HPI.  PHYSICAL EXAMINATION:    BP 110/60 (BP Location: Right Arm, Patient Position: Sitting, Cuff Size: Large)   Pulse 76   Ht _0  (1.499 m)   Wt 188 lb (85.3 kg)   LMP 05/07/2005 (Approximate)   BMI 37.97 kg/m     General appearance: alert, cooperative and appears stated age  Pelvic: External genitalia:  no lesions              Urethra:  normal appearing urethra with no masses, tenderness or lesions              Bartholins and Skenes: normal                 Vagina: normal appearing vagina with normal color and discharge, no lesions              Cervix: no lesions                Bimanual Exam:  Uterus:  normal size, contour, position, consistency, mobility, non-tender              Adnexa: no mass, fullness, tenderness       Chaperone was present for exam.  ASSESSMENT  Status post hysteroscopy with dilation and curettage. Benign endometrial polyp.  No hyperplasia, proliferation, or malignancy seen.  Hypokalemia.   PLAN  Surgical procedure, findings, and pathology report reviewed with patient.  OK to stop with progesterone.  Call for any recurrent post menopausal bleeding.  Will see PCP for K recheck.  Follow up here prn.    An After Visit Summary was printed and given to the patient.  __10____ minutes face to  face time of which over 50% was spent in counseling.

## 2016-02-24 ENCOUNTER — Telehealth: Payer: Self-pay | Admitting: Family Medicine

## 2016-02-24 NOTE — Telephone Encounter (Signed)
lmom to call and reschedule his appointment that he had with Brigitte Pulse on 04-26-2016 she will not be in the office that day

## 2016-02-27 DIAGNOSIS — M461 Sacroiliitis, not elsewhere classified: Secondary | ICD-10-CM | POA: Insufficient documentation

## 2016-02-27 DIAGNOSIS — R1031 Right lower quadrant pain: Secondary | ICD-10-CM | POA: Insufficient documentation

## 2016-02-29 ENCOUNTER — Ambulatory Visit (INDEPENDENT_AMBULATORY_CARE_PROVIDER_SITE_OTHER): Payer: Commercial Managed Care - HMO | Admitting: Obstetrics and Gynecology

## 2016-02-29 ENCOUNTER — Encounter: Payer: Self-pay | Admitting: Obstetrics and Gynecology

## 2016-02-29 VITALS — BP 100/62 | HR 70 | Ht 59.0 in | Wt 186.8 lb

## 2016-02-29 DIAGNOSIS — R9389 Abnormal findings on diagnostic imaging of other specified body structures: Secondary | ICD-10-CM

## 2016-02-29 DIAGNOSIS — Z8742 Personal history of other diseases of the female genital tract: Secondary | ICD-10-CM

## 2016-02-29 DIAGNOSIS — R938 Abnormal findings on diagnostic imaging of other specified body structures: Secondary | ICD-10-CM

## 2016-02-29 NOTE — Progress Notes (Signed)
GYNECOLOGY  VISIT   HPI: 60 y.o.   Married  Caucasian  female   [phone] with Patient's last menstrual period was 05/07/2005 (approximate).   here for follow up.  Had hysteroscopy with dilation and curettage for hx thickened endometrium, proliferative endometrium on EMB, and postmenopausal bleeding.  Surgical pathology - Scant endometrium.  Benign endometrial polyp.  No proliferation noted.   No bleeding since prior to surgery.   GYNECOLOGIC HISTORY: Patient's last menstrual period was 05/07/2005 (approximate). Contraception:  Postmenopausal Menopausal hormone therapy:  none Last mammogram:   11-16-15 3D/Density A/benign vascular calcifications both breasts/Neg/BiRads2:[facility] Last pap smear:  05-14-14 Neg:Neg HR HPV          OB History    Gravida Para Term Preterm AB Living   5 5 5     5    SAB TAB Ectopic Multiple Live Births                     Patient Active Problem List   Diagnosis Date Noted  . Postmenopausal bleeding 12/01/2015  . Endometrial thickening on ultra sound 07/28/2015  . 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 Type 2 diabetes mellitus ([place]) 05/18/2014  . Diverticulitis 08/26/2013  . Leukocytosis 08/26/2013  . Abdominal pain 08/26/2013  . DDD (degenerative disc disease) 03/18/2013  . Metabolic syndrome 61/44/3154  . Vitamin D deficiency 07/29/2012  . Essential hypertension, benign 07/18/2012  . Hyperlipidemia 07/18/2012  . Obstructive sleep apnea 07/04/2012  . Osteoporosis 07/04/2012  . Morbid obesity ([place]) 07/04/2012  . Tobacco use disorder 07/04/2012  . Trochanteric bursitis of right hip 07/04/2012    Past Medical History:  Diagnosis Date  . Arthritis   . Diabetes mellitus without complication ([place])    per pt she is pre- diabetic  . Diverticulitis   . Hypertension   . Osteoporosis   . Sleep apnea     Past Surgical History:  Procedure Laterality Date  . DILATATION & CURETTAGE/HYSTEROSCOPY WITH MYOSURE N/A 12/27/2015   Procedure: DILATATION &  CURETTAGE/HYSTEROSCOPY;  Surgeon: Nunzio Cobbs, MD;  Location: [facility];  Service: Gynecology;  Laterality: N/A;  . ELBOW SURGERY Left ~2007   tendon repair by Dr. Veverly Fells  . EYE SURGERY Bilateral 2008   lasik  . lower aortic bypass  2000   per patient  . TOTAL HIP ARTHROPLASTY Right 07/27/2014   Procedure: RIGHT TOTAL HIP ARTHROPLASTY ANTERIOR APPROACH;  Surgeon: Paralee Cancel, MD;  Location: [facility];  Service: Orthopedics;  Laterality: Right;    Current Outpatient Prescriptions  Medication Sig Dispense Refill  . aspirin 81 MG chewable tablet Chew 81 mg by mouth daily.    Marland Kitchen atenolol-chlorthalidone (TENORETIC) 50-25 MG tablet Take 1 tablet by mouth daily. 90 tablet 3  . atorvastatin (LIPITOR) 40 MG tablet Take 1 tablet (40 mg total) by mouth daily at 6 PM. 90 tablet 3  . blood glucose meter kit and supplies Dispense based on patient and insurance preference. Use as directed once a day. (DX: R73.9) 1 each 0  . furosemide (LASIX) 20 MG tablet Take 1 tablet (20 mg total) by mouth daily. 90 tablet 3  . glucose blood (GLUCOSE METER TEST) test strip Use as instructed once a day. (DX:R73.9) 50 each 2  . ibuprofen (ADVIL,MOTRIN) 800 MG tablet Take 1 tablet (800 mg total) by mouth every 8 (eight) hours as needed. 30 tablet 0  . Lancets MISC Use as directed once a day. (DX: R73.9) 100 each 2  . metFORMIN (  GLUCOPHAGE) 500 MG tablet Take 1 tablet (500 mg total) by mouth 2 (two) times daily with a meal. 180 tablet 3  . Omega-3 Fatty Acids (OMEGA 3 PO) Take 1 capsule by mouth 2 (two) times daily.    . potassium chloride 20 MEQ TBCR Take 20 mEq by mouth daily. 30 tablet 0   No current facility-administered medications for this visit.      ALLERGIES: Codeine  Family History  Problem Relation Age of Onset  . Cancer Father 59    Dec age 64 with pancreatic Ca  . Migraines Mother   . Thyroid disease Mother     hypothyroid    Social History   Social History  . Marital status: Married     Spouse name: N/A  . Number of children: N/A  . 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 Smoking status: Current Every Day Smoker    Packs/day: 0.50    Years: 40.00    Types: Cigarettes  . Smokeless tobacco: Never Used  . Alcohol use 0.0 oz/week     Comment: very rare--1/month  . Drug use: No  . Sexual activity: Yes    Partners: Male    Birth control/ protection: Post-menopausal   Other Topics Concern  . Not on file   Social History Narrative   Married. Patient does not exercise. She has a Gaffer.    ROS:  Pertinent items are noted in HPI.  PHYSICAL EXAMINATION:    BP 100/62 (BP Location: Right Arm, Patient Position: Sitting, Cuff Size: Normal)   Pulse 70   Ht 4' 11"  (1.499 m)   Wt 186 lb 12.8 oz (84.7 kg)   LMP 05/07/2005 (Approximate)   BMI 37.73 kg/m     General appearance: alert, cooperative and appears stated age   ASSESSMENT  Status post hysteroscopy with dilation and curettage.   PLAN  Surgical findings, procedure, and pathology reviewed.  No progesterone tx needed.  Follow up prn.  Patient will see her PCP for her routine exam and may do her GYN follow up there.  Patient was educated to call for any future postmenopausal bleeding.  She is in agreement.   An After Visit Summary was printed and given to the patient.

## 2016-04-26 ENCOUNTER — Ambulatory Visit: Payer: Commercial Managed Care - HMO | Admitting: Family Medicine

## 2016-05-09 NOTE — Progress Notes (Signed)
Subjective:    Patient ID: Diane Giles, female    DOB: October 24, 1955, 61 y.o.   MRN: [id number] Chief Complaint  Patient presents with  . Follow-up    6 month follow up    HPI  Diane Giles is a 61 yo woman who is here for a 6 mo follow-up on her chronic medical conditions. She has had to go [facility] and tried a huge # of procedures and nothing has helped her hip pain so they tried the SI injection.  They pain does not occur with sitting or standing but starts after she walks 50 ft and worsens in her right groin/anterior and she has to stop walking and then wait for sev mos for it to resolve.  infreq she feels that her hip flexor is spasmsed or locked into a position and will last for 1/2 day and nothing she cando - moving wiggling, walking won't help. When severe it radiates laterally.  Started on a roller coaster 5 years ago. When she travels she has to go in a wheelchair.  She   This is copied from her last visit here 6 mos prior. DMII:  Recent diagnosis, hgba1c was 6.4-6.5 but at last visit 4 mos prior Hgba1c increased to 7.0 so pt started on metformin 500 bid. microalb done 06/2015 was mildly elev.  optho visit to [place] optho with Dr. Sinda Du.  Has not been checking cbgs - does not want to pick her finger.  Taking asa.   She has been going to the gym 4-5x/wk but her hip is holding her back - she has had no sig improvement in hip pain since her hip replacement. Ortho here had nothing else to offer her so she went to [facility] who is started a specific PT and injections. She feels she underwent the hip replacement unnecessarily. She cannot do bike or elipitical but can do stairs, squats, and weight lifting but she still can't walk.  HTN: On lasix 20, atenolol, and chlorthalidone 25.  HPL: above goal of LDL<100 with LDL 109 and non-hdl of 152. On lipitor 40. Taking fish oil bid and baby asa 81mg .  Tob abuse: 1/2 ppd x 40 yrs but was considering quitting since she retired. get screening lung  ct?  OSA: on cpap regularly. Osteopenia with h/o vit D def: vit D 50 9/16, takes otc vit D daily and drinks >3 glasses milk/d. Did not get zostavax due to cost. Pt retired in Feb 2017  Depression screen [facility] 2/9 05/10/2016 10/27/2015 06/16/2015 01/20/2015 09/17/2014  Decreased Interest 0 0 0 0 0  Down, Depressed, Hopeless 3 0 0 0 0  PHQ - 2 Score 3 0 0 0 0  Altered sleeping 0 - - - -  Tired, decreased energy 1 - - - -  Change in appetite 0 - - - -  Feeling bad or failure about yourself  1 - - - -  Trouble concentrating 0 - - - -  Moving slowly or fidgety/restless 0 - - - -  Suicidal thoughts 0 - - - -  PHQ-9 Score 5 - - - -  Difficult doing work/chores Somewhat difficult - - - -   Past Medical History:  Diagnosis Date  . Arthritis   . Diabetes mellitus without complication (HCC)    per pt she is pre- diabetic  . Diverticulitis   . Hypertension   . Osteoporosis   . Sleep apnea    Past Surgical History:  Procedure Laterality Date  .  DILATATION & CURETTAGE/HYSTEROSCOPY WITH MYOSURE N/A 12/27/2015   Procedure: DILATATION & CURETTAGE/HYSTEROSCOPY;  Surgeon: Patton Salles, MD;  Location: [facility];  Service: Gynecology;  Laterality: N/A;  . DILATION AND CURETTAGE OF UTERUS  11/2015  . ELBOW SURGERY Left ~2007   tendon repair by Dr. Ranell Patrick  . EYE SURGERY Bilateral 2008   lasik  . lower aortic bypass  2000   per patient  . TOTAL HIP ARTHROPLASTY Right 07/27/2014   Procedure: RIGHT TOTAL HIP ARTHROPLASTY ANTERIOR APPROACH;  Surgeon: Durene Romans, MD;  Location: [facility];  Service: Orthopedics;  Laterality: Right;   Current Outpatient Prescriptions on File Prior to Visit  Medication Sig Dispense Refill  . aspirin 81 MG chewable tablet Chew 81 mg by mouth daily.    Marland Kitchen atenolol-chlorthalidone (TENORETIC) 50-25 MG tablet Take 1 tablet by mouth daily. 90 tablet 3  . blood glucose meter kit and supplies Dispense based on patient and insurance preference. Use as directed once a day. (DX:  R73.9) 1 each 0  . furosemide (LASIX) 20 MG tablet Take 1 tablet (20 mg total) by mouth daily. 90 tablet 3  . glucose blood (GLUCOSE METER TEST) test strip Use as instructed once a day. (DX:R73.9) 50 each 2  . Lancets MISC Use as directed once a day. (DX: R73.9) 100 each 2  . Omega-3 Fatty Acids (OMEGA 3 PO) Take 1 capsule by mouth 2 (two) times daily.    . potassium chloride 20 MEQ TBCR Take 20 mEq by mouth daily. 30 tablet 0   No current facility-administered medications on file prior to visit.    Allergies  Allergen Reactions  . Codeine Itching   Family History  Problem Relation Age of Onset  . Cancer Father 67    Dec age 35 with pancreatic Ca  . Migraines Mother   . Thyroid disease Mother     hypothyroid   Social History   Social History  . Marital status: Married    Spouse name: N/A  . Number of children: N/A  . 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 Smoking status: Current Every Day Smoker    Packs/day: 0.50    Years: 40.00    Types: Cigarettes  . Smokeless tobacco: Never Used  . Alcohol use 0.0 oz/week     Comment: very rare--1/month  . Drug use: No  . Sexual activity: Yes    Partners: Male    Birth control/ protection: Post-menopausal   Other Topics Concern  . None   Social History Narrative   Married. Patient does not exercise. She has a Geographical information systems officer.    . Review of Systems  Constitutional: Positive for fatigue. Negative for appetite change, chills, diaphoresis and fever.  Eyes: Negative for visual disturbance.  Respiratory: Negative for cough and shortness of breath.   Cardiovascular: Positive for leg swelling. Negative for chest pain and palpitations.  Genitourinary: Negative for decreased urine volume.  Musculoskeletal: Positive for arthralgias, gait problem and myalgias. Negative for back pain and joint swelling.  Neurological: Negative for syncope and headaches.  Hematological:  Does not bruise/bleed easily.   See hpi    Objective:   Physical Exam  Constitutional: She is oriented to person, place, and time. She appears well-developed and well-nourished. No distress.  HENT:  Head: Normocephalic and atraumatic.  Right Ear: External ear normal.  Left Ear: External ear normal.  Eyes: Conjunctivae are normal. No  scleral icterus.  Neck: Normal range of motion. Neck supple. No thyromegaly present.  Cardiovascular: Normal rate, regular rhythm, normal heart sounds and intact distal pulses.   Pulmonary/Chest: Effort normal and breath sounds normal. No respiratory distress.  Musculoskeletal: She exhibits no edema.  Lymphadenopathy:    She has no cervical adenopathy.  Neurological: She is alert and oriented to person, place, and time.  Skin: Skin is warm and dry. She is not diaphoretic. No erythema.  Psychiatric: She has a normal mood and affect. Her behavior is normal.   Diabetic Foot Exam - Simple   Simple Foot Form Diabetic Foot exam was performed with the following findings:  Yes 05/10/2016 10:23 AM  Visual Inspection Sensation Testing Pulse Check Comments        BP 133/69 (BP Location: Left Arm, Patient Position: Sitting, Cuff Size: Small)   Pulse 74   Temp 97.9 F (36.6 C) (Oral)   Ht 4\' 11"  (1.499 m)   Wt 186 lb (84.4 kg)   LMP 05/07/2005 (Approximate)   BMI 37.57 kg/m   Assessment & Plan:  a1c needs pneumovax - we are out so pt did NOT get today, address at f/u. Needs foot exam  She has not had inflammatory factors tested but has had studies were done on her synovial fluid which much did not show any crystals or cells CRP done in August and September and [facility] was mildly elevated at 2-2.3 with normal of less than 0.6 with sedimentation rate elevated at 66-70 If inflammatory levels are still elevated,will try a rheumatology referral.  1. Type 2 diabetes mellitus without complication, without long-term current use of insulin (HCC) - increase  metformin from 500 bid to 1000 bid.  Asked pt to RTC to discuss additional med treatment as a1c was 16.9 today up from 7.0 at last check. Refer to diabetic education  2. Essential hypertension, benign - + microalbumin - start lisinopril.  3. Obstructive sleep apnea   4. Tobacco use disorder - refer for screening lung cancer ct  5. Morbid obesity (HCC)   6. Hyperlipidemia, unspecified hyperlipidemia type   7. Pain of right hip joint - inflammatory levels mildly elevated likely due to uncontrolled dm, tobacco use, and obesity but as alk phos is now elevated as well I do think it is worth getting a 3rd opinion - will refer to rheum. nml 3 phase bone scan in Aug at [facility] and last alk phos was June and was normal.    Orders Placed This Encounter  Procedures  . Pneumococcal polysaccharide vaccine 23-valent greater than or equal to 2yo subcutaneous/IM  . CBC  . Comprehensive metabolic panel    Order Specific Question:   Has the patient fasted?    Answer:   Yes  . Lipid panel    Order Specific Question:   Has the patient fasted?    Answer:   Yes  . Microalbumin/Creatinine Ratio, Urine  . Sedimentation Rate  . C-reactive protein  . Ambulatory Referral for Lung Cancer Scre    Referral Priority:   Routine    Referral Type:   Consultation    Referral Reason:   Specialty Services Required    Number of Visits Requested:   1  . Care order/instruction:    AVS - please print after vaccine is given and let pt go  . POCT glycosylated hemoglobin (Hb A1C)  . HM DIABETES FOOT EXAM    Meds ordered this encounter  Medications  . metFORMIN (GLUCOPHAGE) 1000 MG tablet  Sig: Take 1 tablet (1,000 mg total) by mouth 2 (two) times daily with a meal.    Dispense:  180 tablet    Refill:  3    I personally performed the services described in this documentation, which was scribed in my presence. The recorded information has been reviewed and considered, and addended by me as needed.   Norberto Sorenson, M.D.    [facility] [address] [place], [place] [address] [phone] phone [phone] fax  05/20/16 12:41 AM  Results for orders placed or performed in visit on 05/10/16  CBC  Result Value Ref Range   WBC 14.7 (H) 3.4 - 10.8 x10E3/uL   RBC 4.46 3.77 - 5.28 x10E6/uL   Hemoglobin 14.1 11.1 - 15.9 g/dL   Hematocrit 53.6 64.4 - 46.6 %   MCV 92 79 - 97 fL   MCH 31.6 26.6 - 33.0 pg   MCHC 34.3 31.5 - 35.7 g/dL   RDW 03.4 74.2 - 59.5 %   Platelets 428 (H) 150 - 379 x10E3/uL  Comprehensive metabolic panel  Result Value Ref Range   Glucose 150 (H) 65 - 99 mg/dL   BUN 13 8 - 27 mg/dL   Creatinine, Ser 6.38 0.57 - 1.00 mg/dL   GFR calc non Af Amer 86 >59 mL/min/1.73   GFR calc Af Amer 99 >59 mL/min/1.73   BUN/Creatinine Ratio 17 12 - 28   Sodium 140 134 - 144 mmol/L   Potassium 3.5 3.5 - 5.2 mmol/L   Chloride 94 (L) 96 - 106 mmol/L   CO2 25 18 - 29 mmol/L   Calcium 9.9 8.7 - 10.3 mg/dL   Total Protein 6.9 6.0 - 8.5 g/dL   Albumin 4.1 3.6 - 4.8 g/dL   Globulin, Total 2.8 1.5 - 4.5 g/dL   Albumin/Globulin Ratio 1.5 1.2 - 2.2   Bilirubin Total 0.4 0.0 - 1.2 mg/dL   Alkaline Phosphatase 175 (H) 39 - 117 IU/L   AST 12 0 - 40 IU/L   ALT 17 0 - 32 IU/L  Lipid panel  Result Value Ref Range   Cholesterol, Total 179 100 - 199 mg/dL   Triglycerides 756 (H) 0 - 149 mg/dL   HDL 38 (L) >43 mg/dL   VLDL Cholesterol Cal 48 (H) 5 - 40 mg/dL   LDL Calculated 93 0 - 99 mg/dL   Chol/HDL Ratio 4.7 (H) 0.0 - 4.4 ratio units  Microalbumin/Creatinine Ratio, Urine  Result Value Ref Range   Creatinine, Urine 11.6 Not Estab. mg/dL   Albumin, Urine 32.9 Not Estab. ug/mL   Microalb/Creat Ratio 623.3 (H) 0.0 - 30.0 mg/g creat  Sedimentation Rate  Result Value Ref Range   Sed Rate 43 (H) 0 - 40 mm/hr  C-reactive protein  Result Value Ref Range   CRP 21.3 (H) 0.0 - 4.9 mg/L  POCT glycosylated hemoglobin (Hb A1C)  Result Value Ref Range   Hemoglobin A1C 16.9

## 2016-05-10 ENCOUNTER — Encounter: Payer: Self-pay | Admitting: Family Medicine

## 2016-05-10 ENCOUNTER — Ambulatory Visit (INDEPENDENT_AMBULATORY_CARE_PROVIDER_SITE_OTHER): Payer: Commercial Managed Care - HMO | Admitting: Family Medicine

## 2016-05-10 VITALS — BP 133/69 | HR 74 | Temp 97.9°F | Ht 59.0 in | Wt 186.0 lb

## 2016-05-10 DIAGNOSIS — M25551 Pain in right hip: Secondary | ICD-10-CM

## 2016-05-10 DIAGNOSIS — E119 Type 2 diabetes mellitus without complications: Secondary | ICD-10-CM | POA: Diagnosis not present

## 2016-05-10 DIAGNOSIS — G4733 Obstructive sleep apnea (adult) (pediatric): Secondary | ICD-10-CM | POA: Diagnosis not present

## 2016-05-10 DIAGNOSIS — I10 Essential (primary) hypertension: Secondary | ICD-10-CM | POA: Diagnosis not present

## 2016-05-10 DIAGNOSIS — E785 Hyperlipidemia, unspecified: Secondary | ICD-10-CM

## 2016-05-10 DIAGNOSIS — F17200 Nicotine dependence, unspecified, uncomplicated: Secondary | ICD-10-CM

## 2016-05-10 LAB — POCT GLYCOSYLATED HEMOGLOBIN (HGB A1C): HEMOGLOBIN A1C: 16.9

## 2016-05-10 MED ORDER — METFORMIN HCL 1000 MG PO TABS: 1000.0000 mg | ORAL_TABLET | Freq: Two times a day (BID) | ORAL | 3 refills | Status: DC

## 2016-05-10 NOTE — Patient Instructions (Addendum)
   IF you received an x-ray today, you will receive an invoice from [facility]. Please contact [facility] at [phone] with questions or concerns regarding your invoice.   IF you received labwork today, you will receive an invoice from [facility]. Please contact [facility] at [phone] with questions or concerns regarding your invoice.   Our billing staff will not be able to assist you with questions regarding bills from these companies.  You will be contacted with the lab results as soon as they are available. The fastest way to get your results is to activate your My Chart account. Instructions are located on the last page of this paperwork. If you have not heard from [place] regarding the results in 2 weeks, please contact this office.    Call Dr. Jeralyn Ruths office I think it would be an excellent idea to see if he would do a diagnostic injection into your right hip to see if you still obtain the same level of pain relief immediately after as you did prior to your hip replacement. I imagine this was likely from the numbing lidocaine medication he used and so likely would not even need to inject any other steroid.  Checkout Dr. Anselmo Rod and the [facility] website.  These both would be good clinics to start investigating alternative treatments and diagnoses of your functional hip pain. It looks like in August and September at [facility] they check your inflammatory levels which were moderately elevated. A variety of things can cause this other than joint pain but at times it could mean there is an rheumatologic or autoimmune causes of joint pain. We will recheck these again today. If they're still elevated we should do additional autoimmune testing and even consider a rheumatologic evaluation as a cause for your pain.  Increase your metformin from 500 twice a day to 1000 twice a day. He may double up on your current dose.  Stop your Lipitor 40 for 1 month to see if it  improves your hip pain. If you get significant improvement please call me. If you do not then restart your Lipitor.

## 2016-05-11 LAB — COMPREHENSIVE METABOLIC PANEL
ALBUMIN: 4.1 g/dL (ref 3.6–4.8)
ALK PHOS: 175 IU/L — AB (ref 39–117)
ALT: 17 IU/L (ref 0–32)
AST: 12 IU/L (ref 0–40)
Albumin/Globulin Ratio: 1.5 (ref 1.2–2.2)
BILIRUBIN TOTAL: 0.4 mg/dL (ref 0.0–1.2)
BUN / CREAT RATIO: 17 (ref 12–28)
BUN: 13 mg/dL (ref 8–27)
CHLORIDE: 94 mmol/L — AB (ref 96–106)
CO2: 25 mmol/L (ref 18–29)
Calcium: 9.9 mg/dL (ref 8.7–10.3)
Creatinine, Ser: 0.75 mg/dL (ref 0.57–1.00)
GFR calc Af Amer: 99 mL/min/{1.73_m2} (ref >59)
GFR calc non Af Amer: 86 mL/min/{1.73_m2} (ref >59)
GLOBULIN, TOTAL: 2.8 g/dL (ref 1.5–4.5)
Glucose: 150 mg/dL — ABNORMAL HIGH (ref 65–99)
POTASSIUM: 3.5 mmol/L (ref 3.5–5.2)
SODIUM: 140 mmol/L (ref 134–144)
Total Protein: 6.9 g/dL (ref 6.0–8.5)

## 2016-05-11 LAB — MICROALBUMIN / CREATININE URINE RATIO
Creatinine, Urine: 11.6 mg/dL
MICROALBUM., U, RANDOM: 72.3 ug/mL
Microalb/Creat Ratio: 623.3 mg/g creat — ABNORMAL HIGH (ref 0.0–30.0)

## 2016-05-11 LAB — CBC
HEMATOCRIT: 41.1 % (ref 34.0–46.6)
HEMOGLOBIN: 14.1 g/dL (ref 11.1–15.9)
MCH: 31.6 pg (ref 26.6–33.0)
MCHC: 34.3 g/dL (ref 31.5–35.7)
MCV: 92 fL (ref 79–97)
Platelets: 428 10*3/uL — ABNORMAL HIGH (ref 150–379)
RBC: 4.46 x10E6/uL (ref 3.77–5.28)
RDW: 13.9 % (ref 12.3–15.4)
WBC: 14.7 10*3/uL — ABNORMAL HIGH (ref 3.4–10.8)

## 2016-05-11 LAB — LIPID PANEL
CHOLESTEROL TOTAL: 179 mg/dL (ref 100–199)
Chol/HDL Ratio: 4.7 ratio units — ABNORMAL HIGH (ref 0.0–4.4)
HDL: 38 mg/dL — ABNORMAL LOW (ref >39)
LDL Calculated: 93 mg/dL (ref 0–99)
Triglycerides: 239 mg/dL — ABNORMAL HIGH (ref 0–149)
VLDL Cholesterol Cal: 48 mg/dL — ABNORMAL HIGH (ref 5–40)

## 2016-05-11 LAB — C-REACTIVE PROTEIN: CRP: 21.3 mg/L — AB (ref 0.0–4.9)

## 2016-05-11 LAB — SEDIMENTATION RATE: Sed Rate: 43 mm/hr — ABNORMAL HIGH (ref 0–40)

## 2016-05-20 ENCOUNTER — Encounter: Payer: Self-pay | Admitting: Family Medicine

## 2016-05-20 MED ORDER — LISINOPRIL 10 MG PO TABS
10.0000 mg | ORAL_TABLET | Freq: Every day | ORAL | 0 refills | Status: DC
Start: 2016-05-20 — End: 2016-08-17

## 2016-05-20 MED ORDER — ATORVASTATIN CALCIUM 40 MG PO TABS: 80.0000 mg | ORAL_TABLET | Freq: Every day | ORAL | 1 refills | Status: DC

## 2016-05-29 NOTE — Progress Notes (Addendum)
Subjective:    Patient ID: Diane Giles, female    DOB: 19-May-1955, 61 y.o.   MRN: [id number] Chief Complaint  Patient presents with  . Follow-up    new medication, lisinopril    HPI  Diane Giles is a 61 yo woman who is here for a 2 wk recheck.  She reports she is doing great.  DMII: 2 wks prior hgba1c returned at 16.9 up from 7.0 6 mos prior.  Metformin was increased from 500 bid to 1000 bid and referred to DM ed. + microalb so was started on lisinopril. She has started checking her sugars  Right hip pain: She actually had a right hip replacement for this pain but unfortunately it did not help at all. Her local orthopedist referred her to [facility] for a second opinion since no etiology has been found - she has been seen by ortho and spine there with SI joint injection, detailed imaging, etc still w/o poss diagnosis or relief. Pt did relate that the only time she had experienced complete relief of the hip pain was when Dr. Ethelene Hal had done an injection of anesthetic under fluoroscopy which would only last for sev hours. I suggested that she see if this could be done again as a diagnostic technique - to see if anything had changed since her hip replacements. I also wonder if pt would benefit from a more non-tranditional technique since she seems to have run up against some brick walls.  Dr. Charlann Boxer going back.   Inflammatory factors were very elevated though this is likely due to the severe acute hyperglycemia.  They were checked due to persistent right hip pain that pt has been suffering with. CRP done in August and September and [facility] was mildly elevated at 2-2.3 with normal of less than 0.6 with sedimentation rate elevated at 66-70. Pt has had studies were done on her synovial fluid which much did not show any crystals or cells.  Alk phos is now starting to be elev (normal in June, nml 3 phase bone scan in Aug at [facility]) so placed rheum referral.   Tob use: referred for screening lung ca  ct  Obesity:  HTN:  HPL:   Past Medical History:  Diagnosis Date  . Arthritis   . Diabetes mellitus without complication (HCC)    per pt she is pre- diabetic  . Diverticulitis   . Hypertension   . Osteoporosis   . Sleep apnea    Past Surgical History:  Procedure Laterality Date  . DILATATION & CURETTAGE/HYSTEROSCOPY WITH MYOSURE N/A 12/27/2015   Procedure: DILATATION & CURETTAGE/HYSTEROSCOPY;  Surgeon: Patton Salles, MD;  Location: [facility];  Service: Gynecology;  Laterality: N/A;  . DILATION AND CURETTAGE OF UTERUS  11/2015  . ELBOW SURGERY Left ~2007   tendon repair by Dr. Ranell Patrick  . EYE SURGERY Bilateral 2008   lasik  . lower aortic bypass  2000   per patient  . TOTAL HIP ARTHROPLASTY Right 07/27/2014   Procedure: RIGHT TOTAL HIP ARTHROPLASTY ANTERIOR APPROACH;  Surgeon: Durene Romans, MD;  Location: [facility];  Service: Orthopedics;  Laterality: Right;   Current Outpatient Prescriptions on File Prior to Visit  Medication Sig Dispense Refill  . aspirin 81 MG chewable tablet Chew 81 mg by mouth daily.    Marland Kitchen atenolol-chlorthalidone (TENORETIC) 50-25 MG tablet Take 1 tablet by mouth daily. 90 tablet 3  . atorvastatin (LIPITOR) 40 MG tablet Take 2 tablets (80 mg total) by mouth daily at 6  PM. 90 tablet 1  . blood glucose meter kit and supplies Dispense based on patient and insurance preference. Use as directed once a day. (DX: R73.9) 1 each 0  . furosemide (LASIX) 20 MG tablet Take 1 tablet (20 mg total) by mouth daily. 90 tablet 3  . glucose blood (GLUCOSE METER TEST) test strip Use as instructed once a day. (DX:R73.9) 50 each 2  . Lancets MISC Use as directed once a day. (DX: R73.9) 100 each 2  . lisinopril (PRINIVIL,ZESTRIL) 10 MG tablet Take 1 tablet (10 mg total) by mouth daily. 90 tablet 0  . metFORMIN (GLUCOPHAGE) 1000 MG tablet Take 1 tablet (1,000 mg total) by mouth 2 (two) times daily with a meal. 180 tablet 3  . Omega-3 Fatty Acids (OMEGA 3 PO) Take 1 capsule by  mouth 2 (two) times daily.     No current facility-administered medications on file prior to visit.    Allergies  Allergen Reactions  . Codeine Itching   Family History  Problem Relation Age of Onset  . Cancer Father 25    Dec age 63 with pancreatic Ca  . Migraines Mother   . Thyroid disease Mother     hypothyroid   Social History   Social History  . Marital status: Married    Spouse name: N/A  . Number of children: N/A  . 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 Smoking status: Current Every Day Smoker    Packs/day: 0.50    Years: 40.00    Types: Cigarettes  . Smokeless tobacco: Never Used  . Alcohol use 0.0 oz/week     Comment: very rare--1/month  . Drug use: No  . Sexual activity: Yes    Partners: Male    Birth control/ protection: Post-menopausal   Other Topics Concern  . None   Social History Narrative   Married. Patient does not exercise. She has a Geographical information systems officer.   Depression screen [facility] 2/9 05/31/2016 05/10/2016 10/27/2015 06/16/2015 01/20/2015  Decreased Interest 0 0 0 0 0  Down, Depressed, Hopeless 0 3 0 0 0  PHQ - 2 Score 0 3 0 0 0  Altered sleeping - 0 - - -  Tired, decreased energy - 1 - - -  Change in appetite - 0 - - -  Feeling bad or failure about yourself  - 1 - - -  Trouble concentrating - 0 - - -  Moving slowly or fidgety/restless - 0 - - -  Suicidal thoughts - 0 - - -  PHQ-9 Score - 5 - - -  Difficult doing work/chores - Somewhat difficult - - -     Review of Systems  Constitutional: Positive for fatigue. Negative for appetite change, chills, diaphoresis and fever.  Eyes: Negative for visual disturbance.  Respiratory: Negative for cough and shortness of breath.   Cardiovascular: Negative for chest pain, palpitations and leg swelling.  Genitourinary: Negative for decreased urine volume.  Musculoskeletal: Positive for arthralgias and gait problem.  Neurological: Negative for  syncope and headaches.  Hematological: Does not bruise/bleed easily.       Objective:   Physical Exam  Constitutional: She is oriented to person, place, and time. She appears well-developed and well-nourished. No distress.  HENT:  Head: Normocephalic and atraumatic.  Right Ear: External ear normal.  Left Ear: External ear normal.  Eyes: Conjunctivae are normal. No scleral icterus.  Neck: Normal range of motion.  Neck supple. No thyromegaly present.  Cardiovascular: Normal rate, regular rhythm, normal heart sounds and intact distal pulses.   Pulmonary/Chest: Effort normal and breath sounds normal. No respiratory distress.  Musculoskeletal: She exhibits no edema.  Lymphadenopathy:    She has no cervical adenopathy.  Neurological: She is alert and oriented to person, place, and time.  Skin: Skin is warm and dry. She is not diaphoretic. No erythema.  Psychiatric: She has a normal mood and affect. Her behavior is normal.      BP 132/78   Pulse 70   Temp 98.6 F (37 C) (Oral)   Resp 16   Ht 4\' 11"  (1.499 m)   Wt 186 lb (84.4 kg)   LMP 05/07/2005 (Approximate)   SpO2 96%   BMI 37.57 kg/m      Assessment & Plan:  Needs pneumovax-23 today as we were out of stock at her last visit. Send zostavax rx to pharm. Glucose, fructosamine, bmp - CHANGED TO CMP TO RECHECK ALK PHOS  SUSPECT RECENT A1C WAS ERROR.  A1C WAS ENTERED AT 16.9 but glucose was 150. I suspect this should have been 6.9 - RECHECK IN SEND OUT   1. Type 2 diabetes mellitus with hyperglycemia, without long-term current use of insulin (HCC) - pt is doing well on increased metformin 1g bid from 500mg  bid. Checking cbgs and all have been good. Cont current regimen, will check fructosamine to confirm a1c results due to recent in-house a1c that was likely error  2. Status post right hip replacement - has appt with Dr. Charlann Boxer who did her surgery, has not checked out any alternative routes I suggested yet  3. Morbid obesity  (HCC)   4. Essential hypertension, benign - ok to refill lisinopril x 6 mos whenever requested  5. Medication monitoring encounter - does not take K with her lasix; gets a lot of high K foods in diet  6. Chronic right hip pain - no role for further attempted inj per Dr. Ethelene Hal; no improvement in sxs when she did trial off of lipitor and off of lasix so will restart these  7. Microalbuminuria due to type 2 diabetes mellitus (HCC) - started lisinopril, tolerating well, recheck Cr    Orders Placed This Encounter  Procedures  . Pneumococcal polysaccharide vaccine 23-valent greater than or equal to 2yo subcutaneous/IM  . Fructosamine  . Hemoglobin A1c  . Comprehensive metabolic panel  . Care order/instruction:    Scheduling Instructions:     Complete orders FOR PNEUMOVAX, AVS PRINTED and go.  Marland Kitchen POCT glucose (manual entry)    Norberto Sorenson, M.D.  [facility] [address] [place], [place] [address] [phone] phone [phone] fax  06/02/16 11:10 PM

## 2016-05-31 ENCOUNTER — Ambulatory Visit (INDEPENDENT_AMBULATORY_CARE_PROVIDER_SITE_OTHER): Payer: Commercial Managed Care - HMO | Admitting: Family Medicine

## 2016-05-31 ENCOUNTER — Encounter: Payer: Self-pay | Admitting: Family Medicine

## 2016-05-31 VITALS — BP 132/78 | HR 70 | Temp 98.6°F | Resp 16 | Ht 59.0 in | Wt 186.0 lb

## 2016-05-31 DIAGNOSIS — M25551 Pain in right hip: Secondary | ICD-10-CM | POA: Diagnosis not present

## 2016-05-31 DIAGNOSIS — G8929 Other chronic pain: Secondary | ICD-10-CM | POA: Diagnosis not present

## 2016-05-31 DIAGNOSIS — Z96641 Presence of right artificial hip joint: Secondary | ICD-10-CM

## 2016-05-31 DIAGNOSIS — I10 Essential (primary) hypertension: Secondary | ICD-10-CM

## 2016-05-31 DIAGNOSIS — R809 Proteinuria, unspecified: Secondary | ICD-10-CM

## 2016-05-31 DIAGNOSIS — E1129 Type 2 diabetes mellitus with other diabetic kidney complication: Secondary | ICD-10-CM

## 2016-05-31 DIAGNOSIS — Z23 Encounter for immunization: Secondary | ICD-10-CM | POA: Diagnosis not present

## 2016-05-31 DIAGNOSIS — E1165 Type 2 diabetes mellitus with hyperglycemia: Secondary | ICD-10-CM | POA: Diagnosis not present

## 2016-05-31 DIAGNOSIS — Z5181 Encounter for therapeutic drug level monitoring: Secondary | ICD-10-CM

## 2016-05-31 LAB — GLUCOSE, POCT (MANUAL RESULT ENTRY): POC Glucose: 95 mg/dl (ref 70–99)

## 2016-05-31 NOTE — Patient Instructions (Addendum)
IF you received an x-ray today, you will receive an invoice from [facility]. Please contact [facility] at [phone] with questions or concerns regarding your invoice.   IF you received labwork today, you will receive an invoice from [facility]. Please contact [facility] at [phone] with questions or concerns regarding your invoice.   Our billing staff will not be able to assist you with questions regarding bills from these companies.  You will be contacted with the lab results as soon as they are available. The fastest way to get your results is to activate your My Chart account. Instructions are located on the last page of this paperwork. If you have not heard from [place] regarding the results in 2 weeks, please contact this office.    Potassium Content of Foods Potassium is a mineral found in many foods and drinks. It helps keep fluids and minerals balanced in your body and affects how steadily your heart beats. Potassium also helps control your blood pressure and keep your muscles and nervous system healthy. Certain health conditions and medicines may change the balance of potassium in your body. When this happens, you can help balance your level of potassium through the foods that you do or do not eat. Your health care provider or dietitian may recommend an amount of potassium that you should have each day. The following lists of foods provide the amount of potassium (in parentheses) per serving in each item. High in potassium The following foods and beverages have 200 mg or more of potassium per serving:  Apricots, 2 raw or 5 dry (200 mg).  Artichoke, 1 medium (345 mg).  Avocado, raw,  each (245 mg).  Banana, 1 medium (425 mg).  Beans, lima, or baked beans, canned,  cup (280 mg).  Beans, white, canned,  cup (595 mg).  Beef roast, 3 oz (320 mg).  Beef, ground, 3 oz (270 mg).  Beets, raw or cooked,  cup (260 mg).  Bran muffin, 2 oz (300  mg).  Broccoli,  cup (230 mg).  Brussels sprouts,  cup (250 mg).  Cantaloupe,  cup (215 mg).  Cereal, 100% bran,  cup (200-400 mg).  Cheeseburger, single, fast food, 1 each (225-400 mg).  Chicken, 3 oz (220 mg).  Clams, canned, 3 oz (535 mg).  Crab, 3 oz (225 mg).  Dates, 5 each (270 mg).  Dried beans and peas,  cup (300-475 mg).  Figs, dried, 2 each (260 mg).  Fish: halibut, tuna, cod, snapper, 3 oz (480 mg).  Fish: salmon, haddock, swordfish, perch, 3 oz (300 mg).  Fish, tuna, canned 3 oz (200 mg).  [place] fries, fast food, 3 oz (470 mg).  Granola with fruit and nuts,  cup (200 mg).  Grapefruit juice,  cup (200 mg).  Greens, beet,  cup (655 mg).  Honeydew melon,  cup (200 mg).  Kale, raw, 1 cup (300 mg).  Kiwi, 1 medium (240 mg).  Kohlrabi, rutabaga, parsnips,  cup (280 mg).  Lentils,  cup (365 mg).  Mango, 1 each (325 mg).  Milk, chocolate, 1 cup (420 mg).  Milk: nonfat, low-fat, whole, buttermilk, 1 cup (350-380 mg).  Molasses, 1 Tbsp (295 mg).  Mushrooms,  cup (280) mg.  Nectarine, 1 each (275 mg).  Nuts: almonds, peanuts, hazelnuts, [place], cashew, mixed, 1 oz (200 mg).  Nuts, pistachios, 1 oz (295 mg).  Orange, 1 each (240 mg).  Orange juice,  cup (235 mg).  Papaya, medium,  fruit (390 mg).  Peanut butter,  chunky, 2 Tbsp (240 mg).  Peanut butter, smooth, 2 Tbsp (210 mg).  Pear, 1 medium (200 mg).  Pomegranate, 1 whole (400 mg).  Pomegranate juice,  cup (215 mg).  Pork, 3 oz (350 mg).  Potato chips, salted, 1 oz (465 mg).  Potato, baked with skin, 1 medium (925 mg).  Potatoes, boiled,  cup (255 mg).  Potatoes, mashed,  cup (330 mg).  Prune juice,  cup (370 mg).  Prunes, 5 each (305 mg).  Pudding, chocolate,  cup (230 mg).  Pumpkin, canned,  cup (250 mg).  Raisins, seedless,  cup (270 mg).  Seeds, sunflower or pumpkin, 1 oz (240 mg).  Soy milk, 1 cup (300 mg).  Spinach,  cup (420  mg).  Spinach, canned,  cup (370 mg).  Sweet potato, baked with skin, 1 medium (450 mg).  Swiss chard,  cup (480 mg).  Tomato or vegetable juice,  cup (275 mg).  Tomato sauce or puree,  cup (400-550 mg).  Tomato, raw, 1 medium (290 mg).  Tomatoes, canned,  cup (200-300 mg).  [place], 3 oz (250 mg).  Wheat germ, 1 oz (250 mg).  Winter squash,  cup (250 mg).  Yogurt, plain or fruited, 6 oz (260-435 mg).  Zucchini,  cup (220 mg). Moderate in potassium The following foods and beverages have 50-200 mg of potassium per serving:  Apple, 1 each (150 mg).  Apple juice,  cup (150 mg).  Applesauce,  cup (90 mg).  Apricot nectar,  cup (140 mg).  Asparagus, small spears,  cup or 6 spears (155 mg).  Bagel, cinnamon raisin, 1 each (130 mg).  Bagel, egg or plain, 4 in., 1 each (70 mg).  Beans, green,  cup (90 mg).  Beans, yellow,  cup (190 mg).  Beer, regular, 12 oz (100 mg).  Beets, canned,  cup (125 mg).  Blackberries,  cup (115 mg).  Blueberries,  cup (60 mg).  Bread, whole wheat, 1 slice (70 mg).  Broccoli, raw,  cup (145 mg).  Cabbage,  cup (150 mg).  Carrots, cooked or raw,  cup (180 mg).  Cauliflower, raw,  cup (150 mg).  Celery, raw,  cup (155 mg).  Cereal, bran flakes, cup (120-150 mg).  Cheese, cottage,  cup (110 mg).  Cherries, 10 each (150 mg).  Chocolate, 1 oz bar (165 mg).  Coffee, brewed 6 oz (90 mg).  Corn,  cup or 1 ear (195 mg).  Cucumbers,  cup (80 mg).  Egg, large, 1 each (60 mg).  Eggplant,  cup (60 mg).  Endive, raw, cup (80 mg).  English muffin, 1 each (65 mg).  Fish, orange roughy, 3 oz (150 mg).  Frankfurter, beef or pork, 1 each (75 mg).  Fruit cocktail,  cup (115 mg).  Grape juice,  cup (170 mg).  Grapefruit,  fruit (175 mg).  Grapes,  cup (155 mg).  Greens: kale, turnip, collard,  cup (110-150 mg).  Ice cream or frozen yogurt, chocolate,  cup (175 mg).  Ice cream or  frozen yogurt, vanilla,  cup (120-150 mg).  Lemons, limes, 1 each (80 mg).  Lettuce, all types, 1 cup (100 mg).  Mixed vegetables,  cup (150 mg).  Mushrooms, raw,  cup (110 mg).  Nuts: walnuts, pecans, or macadamia, 1 oz (125 mg).  Oatmeal,  cup (80 mg).  Okra,  cup (110 mg).  Onions, raw,  cup (120 mg).  Peach, 1 each (185 mg).  Peaches, canned,  cup (120 mg).  Pears, canned,  cup (120 mg).  Peas, green, frozen,  cup (90 mg).  Peppers, green,  cup (130 mg).  Peppers, red,  cup (160 mg).  Pineapple juice,  cup (165 mg).  Pineapple, fresh or canned,  cup (100 mg).  Plums, 1 each (105 mg).  Pudding, vanilla,  cup (150 mg).  Raspberries,  cup (90 mg).  Rhubarb,  cup (115 mg).  Rice, wild,  cup (80 mg).  Shrimp, 3 oz (155 mg).  Spinach, raw, 1 cup (170 mg).  Strawberries,  cup (125 mg).  Summer squash  cup (175-200 mg).  Swiss chard, raw, 1 cup (135 mg).  Tangerines, 1 each (140 mg).  Tea, brewed, 6 oz (65 mg).  Turnips,  cup (140 mg).  Watermelon,  cup (85 mg).  Wine, red, table, 5 oz (180 mg).  Wine, white, table, 5 oz (100 mg). Low in potassium The following foods and beverages have less than 50 mg of potassium per serving.  Bread, white, 1 slice (30 mg).  Carbonated beverages, 12 oz (less than 5 mg).  Cheese, 1 oz (20-30 mg).  Cranberries,  cup (45 mg).  Cranberry juice cocktail,  cup (20 mg).  Fats and oils, 1 Tbsp (less than 5 mg).  Hummus, 1 Tbsp (32 mg).  Nectar: papaya, mango, or pear,  cup (35 mg).  Rice, white or brown,  cup (50 mg).  Spaghetti or macaroni,  cup cooked (30 mg).  Tortilla, flour or corn, 1 each (50 mg).  Waffle, 4 in., 1 each (50 mg).  Water chestnuts,  cup (40 mg). This information is not intended to replace advice given to you by your health care provider. Make sure you discuss any questions you have with your health care provider. Document Released: 12/05/2004 Document  Revised: 09/29/2015 Document Reviewed: 03/20/2013 Elsevier Interactive Patient Education  2017 [employer].

## 2016-06-01 LAB — COMPREHENSIVE METABOLIC PANEL
A/G RATIO: 1.8 (ref 1.2–2.2)
ALT: 15 IU/L (ref 0–32)
AST: 13 IU/L (ref 0–40)
Albumin: 4.2 g/dL (ref 3.6–4.8)
Alkaline Phosphatase: 139 IU/L — ABNORMAL HIGH (ref 39–117)
BILIRUBIN TOTAL: 0.3 mg/dL (ref 0.0–1.2)
BUN/Creatinine Ratio: 19 (ref 12–28)
BUN: 12 mg/dL (ref 8–27)
CALCIUM: 9.5 mg/dL (ref 8.7–10.3)
CHLORIDE: 98 mmol/L (ref 96–106)
CO2: 22 mmol/L (ref 18–29)
Creatinine, Ser: 0.64 mg/dL (ref 0.57–1.00)
GFR calc Af Amer: 111 mL/min/{1.73_m2} (ref >59)
GFR, EST NON AFRICAN AMERICAN: 96 mL/min/{1.73_m2} (ref >59)
GLOBULIN, TOTAL: 2.4 g/dL (ref 1.5–4.5)
Glucose: 99 mg/dL (ref 65–99)
POTASSIUM: 4.3 mmol/L (ref 3.5–5.2)
SODIUM: 140 mmol/L (ref 134–144)
Total Protein: 6.6 g/dL (ref 6.0–8.5)

## 2016-06-01 LAB — HEMOGLOBIN A1C
ESTIMATED AVERAGE GLUCOSE: 134 mg/dL
HEMOGLOBIN A1C: 6.3 % — AB (ref 4.8–5.6)

## 2016-06-01 LAB — FRUCTOSAMINE: Fructosamine: 187 umol/L (ref 0–285)

## 2016-06-20 ENCOUNTER — Other Ambulatory Visit: Payer: Self-pay | Admitting: Acute Care

## 2016-06-20 DIAGNOSIS — F17210 Nicotine dependence, cigarettes, uncomplicated: Principal | ICD-10-CM

## 2016-07-04 ENCOUNTER — Ambulatory Visit (INDEPENDENT_AMBULATORY_CARE_PROVIDER_SITE_OTHER)
Admission: RE | Admit: 2016-07-04 | Discharge: 2016-07-04 | Disposition: A | Discharge status: Home / Self Care | Payer: Commercial Managed Care - HMO | Source: Ambulatory Visit | Attending: Acute Care | Admitting: Acute Care

## 2016-07-04 ENCOUNTER — Ambulatory Visit (INDEPENDENT_AMBULATORY_CARE_PROVIDER_SITE_OTHER): Payer: Commercial Managed Care - HMO | Admitting: Acute Care

## 2016-07-04 ENCOUNTER — Encounter: Payer: Self-pay | Admitting: Acute Care

## 2016-07-04 DIAGNOSIS — Z87891 Personal history of nicotine dependence: Secondary | ICD-10-CM

## 2016-07-04 DIAGNOSIS — F17210 Nicotine dependence, cigarettes, uncomplicated: Secondary | ICD-10-CM | POA: Diagnosis not present

## 2016-07-04 NOTE — Progress Notes (Signed)
Shared Decision Making Visit Lung Cancer Screening Program [phone])   Eligibility:  Age 61 y.o.  Pack Years Smoking History Calculation 40 pack year smoking history (# packs/per year x # years smoked)  Recent History of coughing up blood  no  Unexplained weight loss? no ( >Than 15 pounds within the last 6 months )  Prior History Lung / other cancer no (Diagnosis within the last 5 years already requiring surveillance chest CT Scans).  Smoking Status Current Smoker  Former Smokers: Years since quit:NA  Quit Date: NA  Visit Components:  Discussion included one or more decision making aids. yes  Discussion included risk/benefits of screening. yes  Discussion included potential follow up diagnostic testing for abnormal scans. yes  Discussion included meaning and risk of over diagnosis. yes  Discussion included meaning and risk of False Positives. yes  Discussion included meaning of total radiation exposure. yes  Counseling Included:  Importance of adherence to annual lung cancer LDCT screening. yes  Impact of comorbidities on ability to participate in the program. yes  Ability and willingness to under diagnostic treatment. yes  Smoking Cessation Counseling:  Current Smokers:   Discussed importance of smoking cessation. yes  Information about tobacco cessation classes and interventions provided to patient. yes  Patient provided with "ticket" for LDCT Scan. yes  Symptomatic Patient. no  Counseling  Diagnosis Code: Tobacco Use Z72.0  Asymptomatic Patient yes  Counseling (Intermediate counseling: > three minutes counseling) [id number]  Former Smokers:   Discussed the importance of maintaining cigarette abstinence. yes  Diagnosis Code: Personal History of Nicotine Dependence. [id number]  Information about tobacco cessation classes and interventions provided to patient. Yes  Patient provided with "ticket" for LDCT Scan. yes  Written Order for Lung Cancer  Screening with LDCT placed in Epic. Yes (CT Chest Lung Cancer Screening Low Dose W/O CM) [id number] Z12.2-Screening of respiratory organs Z87.891-Personal history of nicotine dependence  I have spent 25 minutes of face to face time with Ms. Knueppel discussing the risks and benefits of lung cancer screening. We viewed a power point together that explained in detail the above noted topics. We paused at intervals to allow for questions to be asked and answered to ensure understanding.We discussed that the single most powerful action that she can take to decrease her risk of developing lung cancer is to quit smoking. We discussed whether or not she is ready to commit to setting a quit date. We discussed options for tools to aid in quitting smoking including nicotine replacement therapy, non-nicotine medications, support groups, Quit Smart classes, and behavior modification. We discussed that often times setting smaller, more achievable goals, such as eliminating 1 cigarette a day for a week and then 2 cigarettes a day for a week can be helpful in slowly decreasing the number of cigarettes smoked. This allows for a sense of accomplishment as well as providing a clinical benefit. I gave her the " Be Stronger Than Your Excuses" card with contact information for community resources, classes, free nicotine replacement therapy, and access to mobile apps, text messaging, and on-line smoking cessation help. I have also given Ms. Karstens my card and contact information in the event she needs to contact me. We discussed the time and location of the scan, and that either June Leap, CMA, or I will call with the results within 24-48 hours of receiving them. I have provided her with a copy of the power point we viewed  as a resource in the event they need reinforcement of  the concepts we discussed today in the office. The patient verbalized understanding of all of  the above and had no further questions upon leaving the office.  They have my contact information in the event they have any further questions. We also discussed that we have found a hight incidence of CAD on these scans. Ms. Nason is currently taking Lipitor. We discussed that we will fax the results of the scan to her PCP. She verbalized understanding of the above and had no further questions upon completion of the visit.  Magdalen Spatz, NP  07/04/2016

## 2016-07-10 ENCOUNTER — Telehealth: Payer: Self-pay | Admitting: Acute Care

## 2016-07-10 DIAGNOSIS — F17210 Nicotine dependence, cigarettes, uncomplicated: Principal | ICD-10-CM

## 2016-07-10 NOTE — Telephone Encounter (Signed)
Spoke with pt regarding ct results.   Pt verbalized that Eric Form, NP had left pt a message with CT results. Copy sent to Dr Delman Cheadle.  Order placed for 1 yr f/u low dose ct.

## 2016-08-17 ENCOUNTER — Other Ambulatory Visit: Payer: Self-pay | Admitting: Family Medicine

## 2016-09-14 ENCOUNTER — Other Ambulatory Visit: Payer: Self-pay | Admitting: Physician Assistant

## 2016-09-14 ENCOUNTER — Other Ambulatory Visit: Payer: Self-pay | Admitting: Family Medicine

## 2016-09-14 NOTE — Telephone Encounter (Signed)
Pt has appt sched on 5/31 - 3 wks. Needs repeat LFTs and fasting lipids since this was a new dose increase 4 mos prior.

## 2016-09-26 ENCOUNTER — Other Ambulatory Visit: Payer: Self-pay | Admitting: *Deleted

## 2016-09-26 DIAGNOSIS — I723 Aneurysm of iliac artery: Secondary | ICD-10-CM

## 2016-10-03 DIAGNOSIS — J432 Centrilobular emphysema: Secondary | ICD-10-CM | POA: Insufficient documentation

## 2016-10-03 NOTE — Progress Notes (Signed)
Subjective:    Patient ID: Diane Giles, female    DOB: January 07, 1956, 61 y.o.   MRN: [id number]    Chief Complaint  Patient presents with  . Diabetes  . Follow-up    HPI  Diane Giles is a 61 yo woman who is here for a 4 mo f/u on her chronic medical conditions inc DMII.  DMII:  Metformin was increased from 500 bid to 1000 bid 05/10/2016. Pt declined to schedule the DM ed referral. + microalb 05/10/16 so was started on lisinopril. Checking her sugars occ (rare) and sees an average that she feels is a little high but lost has 6-7 lbs but not exercising so has made some good adjustments in diet by adding more vegetables.  Asa 81.  Optho - had laser surgery on Rt eye with Dr. Markus Giles so following with him for a lasik update recently - last seen a month ago - and told her eyes look good and she had a baseline expected cataract  07/19/15 Dr. Sinda Giles at [facility]. Lab Results  Component Value Date   HGBA1C 6.3 (H) 05/31/2016   HGBA1C 6.9  05/10/2016   HGBA1C 6.4 10/27/2015   HGBA1C 7.0 06/16/2015   HGBA1C 6.5 01/20/2015     Right hip pain: She actually had a right hip replacement for this pain but unfortunately it did not help at all. Her local orthopedist Dr. Charlann Giles referred her to [facility] for a second opinion since no etiology has been found - she has been seen by ortho and spine there with SI joint injection, detailed imaging, etc still w/o poss diagnosis or relief. Pt did relate that the only time she had experienced complete relief of the hip pain was when Dr. Ethelene Giles had done an injection of anesthetic under fluoroscopy which would only last for sev hours but can no longer have this done since hip has been replaced there would be no reason to put cortisone into an artificial joint space.  Did trial off of both lipitor and lasix to see if these might be contributing to pain but were not so restarted. Pt also went back to see Dr. Charlann Giles to re-eval plan since [facility] seemingly had nothing to  offer.  Inflammatory factors were checked due to persistent right hip pain with which pt has been suffering and were elevated so referred pt to [facility]. Pt has had studies were done on her synovial fluid which much did not show any crystals or cells.  Alk phos is now starting to be elev (normal in June, nml 3 phase bone scan in Aug at [facility]) but was sig improved to mild elevation on recheck 3 wks later.  Lab Results  Component Value Date   CRP 21.3 (H) 05/10/2016  nml <5 so this I actually likely equivalent to piror elevations seen last year when CRP done in August and September and [facility] was mildly elevated at 2-2.3 with normal of less than 0.6   Lab Results  Component Value Date   ESRSEDRATE 43 (H) 05/10/2016   ESRSEDRATE 32 (H) 06/09/2007  with sedimentation rate elevated at 66-70 at [facility] last year  Tob use: Had her screening lung CT - no nodules but did so centrilobular emphysema. Currently at 1/2 ppd but seriously considering quitting for the first time in a long time. Prior when she tried she substituted physical activity. She does not want to take more pills or do the patch. She is not sure she is willing to try  the gum but consider.   Obesity:  HTN: Lisinopril 10, atenolol-chlorthalidone 50-25, lasix 20 does not take K with her lasix; gets a lot of high K foods in diet. No cramps. Does not check outside office.  HLD: goal LDL <70 so increased atorvastatin from 40 to 80 in Jan 2018.   Lab Results  Component Value Date   LDLCALC 93 05/10/2016   LDLCALC 97 10/27/2015   LDLCALC 109 06/16/2015     CAD: On asa 81, bb, acei, statin  OSA:  COPD: new diagnosis, will to do spirometry.  Past Medical History:  Diagnosis Date  . Arthritis   . Diabetes mellitus without complication (HCC)    per pt she is pre- diabetic  . Diverticulitis   . Hypertension   . Osteoporosis   . Sleep apnea    Past Surgical History:  Procedure Laterality Date  . DILATATION &  CURETTAGE/HYSTEROSCOPY WITH MYOSURE N/A 12/27/2015   Procedure: DILATATION & CURETTAGE/HYSTEROSCOPY;  Surgeon: Diane Salles, MD;  Location: [facility];  Service: Gynecology;  Laterality: N/A;  . DILATION AND CURETTAGE OF UTERUS  11/2015  . ELBOW SURGERY Left ~2007   tendon repair by Dr. Ranell Giles  . EYE SURGERY Bilateral 2008   lasik  . lower aortic bypass  2000   per patient  . TOTAL HIP ARTHROPLASTY Right 07/27/2014   Procedure: RIGHT TOTAL HIP ARTHROPLASTY ANTERIOR APPROACH;  Surgeon: Diane Romans, MD;  Location: [facility];  Service: Orthopedics;  Laterality: Right;   Current Outpatient Prescriptions on File Prior to Visit  Medication Sig Dispense Refill  . aspirin 81 MG chewable tablet Chew 81 mg by mouth daily.    Marland Kitchen atenolol-chlorthalidone (TENORETIC) 50-25 MG tablet TAKE 1 TABLET EACH DAY. 90 tablet 1  . atorvastatin (LIPITOR) 80 MG tablet TAKE 1 TABLET AT 6PM. 30 tablet 0  . blood glucose meter kit and supplies Dispense based on patient and insurance preference. Use as directed once a day. (DX: R73.9) 1 each 0  . furosemide (LASIX) 20 MG tablet Take 1 tablet (20 mg total) by mouth daily. 90 tablet 3  . glucose blood (GLUCOSE METER TEST) test strip Use as instructed once a day. (DX:R73.9) 50 each 2  . Lancets MISC Use as directed once a day. (DX: R73.9) 100 each 2  . lisinopril (PRINIVIL,ZESTRIL) 10 MG tablet TAKE 1 TABLET ONCE DAILY. 90 tablet 0  . metFORMIN (GLUCOPHAGE) 1000 MG tablet Take 1 tablet (1,000 mg total) by mouth 2 (two) times daily with a meal. 180 tablet 3  . Omega-3 Fatty Acids (OMEGA 3 PO) Take 1 capsule by mouth 2 (two) times daily.     No current facility-administered medications on file prior to visit.    Allergies  Allergen Reactions  . Codeine Itching   Family History  Problem Relation Age of Onset  . Cancer Father 51       Dec age 38 with pancreatic Ca  . Migraines Mother   . Thyroid disease Mother        hypothyroid   Social History   Social  History  . Marital status: Married    Spouse name: N/A  . Number of children: N/A  . 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 Smoking status: Current Every Day Smoker    Packs/day: 1.00    Years: 40.00    Types: Cigarettes  . Smokeless tobacco: Never Used  Comment: Currently smoking 1/2 ppd  . Alcohol use 0.0 oz/week     Comment: very rare--1/month  . Drug use: No  . Sexual activity: Yes    Partners: Male    Birth control/ protection: Post-menopausal   Other Topics Concern  . None   Social History Narrative   Married. Patient does not exercise. She has a Geographical information systems officer.   Depression screen [facility] 2/9 10/04/2016 05/31/2016 05/10/2016 10/27/2015 06/16/2015  Decreased Interest 0 0 0 0 0  Down, Depressed, Hopeless 0 0 3 0 0  PHQ - 2 Score 0 0 3 0 0  Altered sleeping - - 0 - -  Tired, decreased energy - - 1 - -  Change in appetite - - 0 - -  Feeling bad or failure about yourself  - - 1 - -  Trouble concentrating - - 0 - -  Moving slowly or fidgety/restless - - 0 - -  Suicidal thoughts - - 0 - -  PHQ-9 Score - - 5 - -  Difficult doing work/chores - - Somewhat difficult - -     Review of Systems  Constitutional: Positive for activity change. Negative for appetite change, fever and unexpected weight change.  Gastrointestinal: Negative for abdominal distention, abdominal pain, constipation, diarrhea, nausea and vomiting.  see hpi     Objective:   Physical Exam  Constitutional: She is oriented to person, place, and time. She appears well-developed and well-nourished. No distress.  HENT:  Head: Normocephalic and atraumatic.  Right Ear: External ear normal.  Left Ear: External ear normal.  Eyes: Conjunctivae are normal. No scleral icterus.  Neck: Normal range of motion. Neck supple. No thyromegaly present.  Cardiovascular: Normal rate, regular rhythm, normal heart sounds and intact distal pulses.   Pulmonary/Chest:  Effort normal and breath sounds normal. No respiratory distress.  Musculoskeletal: She exhibits no edema.  Lymphadenopathy:    She has no cervical adenopathy.  Neurological: She is alert and oriented to person, place, and time.  Skin: Skin is warm and dry. She is not diaphoretic. No erythema.  Psychiatric: She has a normal mood and affect. Her behavior is normal.    Diabetic Foot Exam - Simple   Simple Foot Form Visual Inspection No deformities, no ulcerations, no other skin breakdown bilaterally:  Yes Sensation Testing Intact to touch and monofilament testing bilaterally:  Yes Pulse Check Posterior Tibialis and Dorsalis pulse intact bilaterally:  Yes Comments Pt has some swelling in both feet      BP (!) 126/56 (BP Location: Left Arm, Patient Position: Sitting, Cuff Size: Large)   Pulse 96   Temp 97.7 F (36.5 C) (Oral)   Resp 16   Ht 4' 10.82" (1.494 m)   Wt 179 lb 12.8 oz (81.6 kg)   LMP 05/07/2005 (Approximate)   SpO2 95%   BMI 36.54 kg/m     Office Spirometry Results: FEV1: 1.73 liters FVC: 2.37 liters FEV1/FVC: 73 % FEV % Predicted: 0.73 % FeF 25-75: 1.21 liters  Assessment & Plan:  Recommend spirometry to assess lung fxn since emphysema seen on CT.    1. Type 2 diabetes mellitus with hyperglycemia, without long-term current use of insulin (HCC)   2. Centrilobular emphysema (HCC) - new diagnosis from CT scan, spirometry results confirm - did not get to discuss w/ pt in office or see print out/curves - hopefully will be scanned in by f/u.  3. Essential hypertension, benign   4. Chronic right hip pain   5. Morbid obesity (  HCC)   6. Leukocytosis, unspecified type   7. Hyperlipidemia, unspecified hyperlipidemia type   8. Osteoporosis without current pathological fracture, unspecified osteoporosis type - reminded pt that she still needs repeat DEXA  9. Tobacco use disorder - pt consider smoking cessation for the first time! Encouraged trial of chantix and pt  will give Kandice Robinsons a call to discuss options as well.    Orders Placed This Encounter  Procedures  . CBC with Differential/Platelet  . Pathologist smear review  . Comprehensive metabolic panel    Order Specific Question:   Has the patient fasted?    Answer:   Yes  . Lipid panel    Order Specific Question:   Has the patient fasted?    Answer:   Yes  . Sedimentation Rate  . C-reactive protein  . Pathologist smear review  . Care order/instruction:    Scheduling Instructions:     Spirometry IF NEB IS ORDERED PLEASE DO A SPIROMETRY BEFORE AND AFTER.  . Care order/instruction:    Scheduling Instructions:     Complete orders, AVS and go.  . Care order/instruction:    Scheduling Instructions:     Recheck BP  . POCT glycosylated hemoglobin (Hb A1C)  . HM Diabetes Foot Exam    Meds ordered this encounter  Medications  . Zoster Vac Recomb Adjuvanted [facility]) injection    Sig: Inject 0.5 mLs into the muscle once. Repeat in 2-6 months    Dispense:  0.5 mL    Refill:  1     Norberto Sorenson, M.D.  Primary Care at [facility] [address] [place], [place] [address] [phone] phone [phone] fax  10/06/16 9:38 PM  Results for orders placed or performed in visit on 10/04/16  Comprehensive metabolic panel  Result Value Ref Range   Glucose 131 (H) 65 - 99 mg/dL   BUN 24 8 - 27 mg/dL   Creatinine, Ser 1.32 0.57 - 1.00 mg/dL   GFR calc non Af Amer 86 >59 mL/min/1.73   GFR calc Af Amer 100 >59 mL/min/1.73   BUN/Creatinine Ratio 32 (H) 12 - 28   Sodium 140 134 - 144 mmol/L   Potassium 3.8 3.5 - 5.2 mmol/L   Chloride 97 96 - 106 mmol/L   CO2 23 18 - 29 mmol/L   Calcium 9.6 8.7 - 10.3 mg/dL   Total Protein 6.7 6.0 - 8.5 g/dL   Albumin 4.1 3.6 - 4.8 g/dL   Globulin, Total 2.6 1.5 - 4.5 g/dL   Albumin/Globulin Ratio 1.6 1.2 - 2.2   Bilirubin Total 0.3 0.0 - 1.2 mg/dL   Alkaline Phosphatase 153 (H) 39 - 117 IU/L   AST 12 0 - 40 IU/L   ALT 15 0 - 32 IU/L  Lipid  panel  Result Value Ref Range   Cholesterol, Total 154 100 - 199 mg/dL   Triglycerides 440 (H) 0 - 149 mg/dL   HDL 37 (L) >10 mg/dL   VLDL Cholesterol Cal 33 5 - 40 mg/dL   LDL Calculated 84 0 - 99 mg/dL   Chol/HDL Ratio 4.2 0.0 - 4.4 ratio  Sedimentation Rate  Result Value Ref Range   Sed Rate 20 0 - 40 mm/hr  C-reactive protein  Result Value Ref Range   CRP 16.6 (H) 0.0 - 4.9 mg/L  Pathologist smear review  Result Value Ref Range   Path Rev WBC Comment    Path Rev RBC WILL FOLLOW    Path Rev PLTs  WILL FOLLOW    PATH INTERP BLD-IMP WILL FOLLOW    PATHOLOGIST NAME WILL FOLLOW    WBC 14.2 (H) 3.4 - 10.8 x10E3/uL   RBC 4.20 3.77 - 5.28 x10E6/uL   Hemoglobin 13.1 11.1 - 15.9 g/dL   Hematocrit 16.1 09.6 - 46.6 %   MCV 92 79 - 97 fL   MCH 31.2 26.6 - 33.0 pg   MCHC 33.9 31.5 - 35.7 g/dL   RDW 04.5 40.9 - 81.1 %   Platelets 428 (H) 150 - 379 x10E3/uL   Neutrophils 66 Not Estab. %   Lymphs 27 Not Estab. %   Monocytes 5 Not Estab. %   Eos 2 Not Estab. %   Basos 0 Not Estab. %   Neutrophils Absolute 9.3 (H) 1.4 - 7.0 x10E3/uL   Lymphocytes Absolute 3.8 (H) 0.7 - 3.1 x10E3/uL   Monocytes Absolute 0.7 0.1 - 0.9 x10E3/uL   EOS (ABSOLUTE) 0.3 0.0 - 0.4 x10E3/uL   Basophils Absolute 0.0 0.0 - 0.2 x10E3/uL   Immature Granulocytes 0 Not Estab. %   Immature Grans (Abs) 0.0 0.0 - 0.1 x10E3/uL  POCT glycosylated hemoglobin (Hb A1C)  Result Value Ref Range   Hemoglobin A1C 6.4

## 2016-10-04 ENCOUNTER — Ambulatory Visit (INDEPENDENT_AMBULATORY_CARE_PROVIDER_SITE_OTHER): Payer: Commercial Managed Care - HMO | Admitting: Family Medicine

## 2016-10-04 ENCOUNTER — Encounter: Payer: Self-pay | Admitting: Family Medicine

## 2016-10-04 VITALS — BP 126/56 | HR 96 | Temp 97.7°F | Resp 16 | Ht 58.82 in | Wt 179.8 lb

## 2016-10-04 DIAGNOSIS — M25551 Pain in right hip: Secondary | ICD-10-CM

## 2016-10-04 DIAGNOSIS — I10 Essential (primary) hypertension: Secondary | ICD-10-CM

## 2016-10-04 DIAGNOSIS — M810 Age-related osteoporosis without current pathological fracture: Secondary | ICD-10-CM

## 2016-10-04 DIAGNOSIS — D72829 Elevated white blood cell count, unspecified: Secondary | ICD-10-CM | POA: Diagnosis not present

## 2016-10-04 DIAGNOSIS — J432 Centrilobular emphysema: Secondary | ICD-10-CM | POA: Diagnosis not present

## 2016-10-04 DIAGNOSIS — F17200 Nicotine dependence, unspecified, uncomplicated: Secondary | ICD-10-CM

## 2016-10-04 DIAGNOSIS — D473 Essential (hemorrhagic) thrombocythemia: Secondary | ICD-10-CM

## 2016-10-04 DIAGNOSIS — E1165 Type 2 diabetes mellitus with hyperglycemia: Secondary | ICD-10-CM

## 2016-10-04 DIAGNOSIS — D75839 Thrombocytosis, unspecified: Secondary | ICD-10-CM

## 2016-10-04 DIAGNOSIS — G8929 Other chronic pain: Secondary | ICD-10-CM

## 2016-10-04 DIAGNOSIS — E785 Hyperlipidemia, unspecified: Secondary | ICD-10-CM

## 2016-10-04 LAB — POCT GLYCOSYLATED HEMOGLOBIN (HGB A1C): Hemoglobin A1C: 6.4

## 2016-10-04 MED ORDER — ZOSTER VAC RECOMB ADJUVANTED 50 MCG/0.5ML IM SUSR: 0.5000 mL | Freq: Once | INTRAMUSCULAR | 1 refills | Status: AC

## 2016-10-04 NOTE — Patient Instructions (Addendum)
Remember that you are due for your DEXA bone scan which is done at the breast center. Please call the breast center at [place] at [phone] to schedule.  Consider Chantix. Call me if you would like a prescription. Call Sarah to discuss this and other options.   IF you received an x-ray today, you will receive an invoice from [facility]. Please contact [facility] at [phone] with questions or concerns regarding your invoice.   IF you received labwork today, you will receive an invoice from [facility]. Please contact [facility] at [phone] with questions or concerns regarding your invoice.   Our billing staff will not be able to assist you with questions regarding bills from these companies.  You will be contacted with the lab results as soon as they are available. The fastest way to get your results is to activate your My Chart account. Instructions are located on the last page of this paperwork. If you have not heard from [place] regarding the results in 2 weeks, please contact this office.     Steps to Quit Smoking Smoking tobacco can be harmful to your health and can affect almost every organ in your body. Smoking puts you, and those around you, at risk for developing many serious chronic diseases. Quitting smoking is difficult, but it is one of the best things that you can do for your health. It is never too late to quit. What are the benefits of quitting smoking? When you quit smoking, you lower your risk of developing serious diseases and conditions, such as:  Lung cancer or lung disease, such as COPD.  Heart disease.  Stroke.  Heart attack.  Infertility.  Osteoporosis and bone fractures.  Additionally, symptoms such as coughing, wheezing, and shortness of breath may get better when you quit. You may also find that you get sick less often because your body is stronger at fighting off colds and infections. If you are pregnant, quitting smoking can  help to reduce your chances of having a baby of low birth weight. How do I get ready to quit? When you decide to quit smoking, create a plan to make sure that you are successful. Before you quit:  Pick a date to quit. Set a date within the next two weeks to give you time to prepare.  Write down the reasons why you are quitting. Keep this list in places where you will see it often, such as on your bathroom mirror or in your car or wallet.  Identify the people, places, things, and activities that make you want to smoke (triggers) and avoid them. Make sure to take these actions: ? Throw away all cigarettes at home, at work, and in your car. ? Throw away smoking accessories, such as Scientist, research (medical). ? Clean your car and make sure to empty the ashtray. ? Clean your home, including curtains and carpets.  Tell your family, friends, and coworkers that you are quitting. Support from your loved ones can make quitting easier.  Talk with your health care provider about your options for quitting smoking.  Find out what treatment options are covered by your health insurance.  What strategies can I use to quit smoking? Talk with your healthcare provider about different strategies to quit smoking. Some strategies include:  Quitting smoking altogether instead of gradually lessening how much you smoke over a period of time. Research shows that quitting "cold [place]" is more successful than gradually quitting.  Attending in-person counseling to help you build problem-solving  skills. You are more likely to have success in quitting if you attend several counseling sessions. Even short sessions of 10 minutes can be effective.  Finding resources and support systems that can help you to quit smoking and remain smoke-free after you quit. These resources are most helpful when you use them often. They can include: ? Online chats with a Social worker. ? Telephone quitlines. ? Clinical research associate. ? Support groups or group counseling. ? Text messaging programs. ? Mobile phone applications.  Taking medicines to help you quit smoking. (If you are pregnant or breastfeeding, talk with your health care provider first.) Some medicines contain nicotine and some do not. Both types of medicines help with cravings, but the medicines that include nicotine help to relieve withdrawal symptoms. Your health care provider may recommend: ? Nicotine patches, gum, or lozenges. ? Nicotine inhalers or sprays. ? Non-nicotine medicine that is taken by mouth.  Talk with your health care provider about combining strategies, such as taking medicines while you are also receiving in-person counseling. Using these two strategies together makes you more likely to succeed in quitting than if you used either strategy on its own. If you are pregnant or breastfeeding, talk with your health care provider about finding counseling or other support strategies to quit smoking. Do not take medicine to help you quit smoking unless told to do so by your health care provider. What things can I do to make it easier to quit? Quitting smoking might feel overwhelming at first, but there is a lot that you can do to make it easier. Take these important actions:  Reach out to your family and friends and ask that they support and encourage you during this time. Call telephone quitlines, reach out to support groups, or work with a counselor for support.  Ask people who smoke to avoid smoking around you.  Avoid places that trigger you to smoke, such as bars, parties, or smoke-break areas at work.  Spend time around people who do not smoke.  Lessen stress in your life, because stress can be a smoking trigger for some people. To lessen stress, try: ? Exercising regularly. ? Deep-breathing exercises. ? Yoga. ? Meditating. ? Performing a body scan. This involves closing your eyes, scanning your body from head to toe, and  noticing which parts of your body are particularly tense. Purposefully relax the muscles in those areas.  Download or purchase mobile phone or tablet apps (applications) that can help you stick to your quit plan by providing reminders, tips, and encouragement. There are many free apps, such as QuitGuide from the [employer] Office manager for Disease Control and Prevention). You can find other support for quitting smoking (smoking cessation) through smokefree.gov and other websites.  How will I feel when I quit smoking? Within the first 24 hours of quitting smoking, you may start to feel some withdrawal symptoms. These symptoms are usually most noticeable 2-3 days after quitting, but they usually do not last beyond 2-3 weeks. Changes or symptoms that you might experience include:  Mood swings.  Restlessness, anxiety, or irritation.  Difficulty concentrating.  Dizziness.  Strong cravings for sugary foods in addition to nicotine.  Mild weight gain.  Constipation.  Nausea.  Coughing or a sore throat.  Changes in how your medicines work in your body.  A depressed mood.  Difficulty sleeping (insomnia).  After the first 2-3 weeks of quitting, you may start to notice more positive results, such as:  Improved sense of smell and taste.  Decreased coughing and sore throat.  Slower heart rate.  Lower blood pressure.  Clearer skin.  The ability to breathe more easily.  Fewer sick days.  Quitting smoking is very challenging for most people. Do not get discouraged if you are not successful the first time. Some people need to make many attempts to quit before they achieve long-term success. Do your best to stick to your quit plan, and talk with your health care provider if you have any questions or concerns. This information is not intended to replace advice given to you by your health care provider. Make sure you discuss any questions you have with your health care provider. Document Released:  04/17/2001 Document Revised: 12/20/2015 Document Reviewed: 09/07/2014 Elsevier Interactive Patient Education  2017 [employer].  Varenicline oral tablets What is this medicine? VARENICLINE (var EN i kleen) is used to help people quit smoking. It can reduce the symptoms caused by stopping smoking. It is used with a patient support program recommended by your physician. This medicine may be used for other purposes; ask your health care provider or pharmacist if you have questions. COMMON BRAND NAME(S): Chantix What should I tell my health care provider before I take this medicine? They need to know if you have any of these conditions: -bipolar disorder, depression, schizophrenia or other mental illness -heart disease -if you often drink alcohol -kidney disease -peripheral vascular disease -seizures -stroke -suicidal thoughts, plans, or attempt; a previous suicide attempt by you or a family member -an unusual or allergic reaction to varenicline, other medicines, foods, dyes, or preservatives -pregnant or trying to get pregnant -breast-feeding How should I use this medicine? Take this medicine by mouth after eating. Take with a full glass of water. Follow the directions on the prescription label. Take your doses at regular intervals. Do not take your medicine more often than directed. There are 3 ways you can use this medicine to help you quit smoking; talk to your health care professional to decide which plan is right for you: 1) you can choose a quit date and start this medicine 1 week before the quit date, or, 2) you can start taking this medicine before you choose a quit date, and then pick a quit date between day 8 and 35 days of treatment, or, 3) if you are not sure that you are able or willing to quit smoking right away, start taking this medicine and slowly decrease the amount you smoke as directed by your health care professional with the goal of being cigarette-free by week 12 of  treatment. Stick to your plan; ask about support groups or other ways to help you remain cigarette-free. If you are motivated to quit smoking and did not succeed during a previous attempt with this medicine for reasons other than side effects, or if you returned to smoking after this treatment, speak with your health care professional about whether another course of this medicine may be right for you. A special MedGuide will be given to you by the pharmacist with each prescription and refill. Be sure to read this information carefully each time. Talk to your pediatrician regarding the use of this medicine in children. This medicine is not approved for use in children. Overdosage: If you think you have taken too much of this medicine contact a poison control center or emergency room at once. NOTE: This medicine is only for you. Do not share this medicine with others. What if I miss a dose? If you miss a dose,  take it as soon as you can. If it is almost time for your next dose, take only that dose. Do not take double or extra doses. What may interact with this medicine? -alcohol or any product that contains alcohol -insulin -other stop smoking aids -theophylline -warfarin This list may not describe all possible interactions. Give your health care provider a list of all the medicines, herbs, non-prescription drugs, or dietary supplements you use. Also tell them if you smoke, drink alcohol, or use illegal drugs. Some items may interact with your medicine. What should I watch for while using this medicine? Visit your doctor or health care professional for regular check ups. Ask for ongoing advice and encouragement from your doctor or healthcare professional, friends, and family to help you quit. If you smoke while on this medication, quit again Your mouth may get dry. Chewing sugarless gum or sucking hard candy, and drinking plenty of water may help. Contact your doctor if the problem does not go away or  is severe. You may get drowsy or dizzy. Do not drive, use machinery, or do anything that needs mental alertness until you know how this medicine affects you. Do not stand or sit up quickly, especially if you are an older patient. This reduces the risk of dizzy or fainting spells. Sleepwalking can happen during treatment with this medicine, and can sometimes lead to behavior that is harmful to you, other people, or property. Stop taking this medicine and tell your doctor if you start sleepwalking or have other unusual sleep-related activity. Decrease the amount of alcoholic beverages that you drink during treatment with this medicine until you know if this medicine affects your ability to tolerate alcohol. Some people have experienced increased drunkenness (intoxication), unusual or sometimes aggressive behavior, or no memory of things that have happened (amnesia) during treatment with this medicine. The use of this medicine may increase the chance of suicidal thoughts or actions. Pay special attention to how you are responding while on this medicine. Any worsening of mood, or thoughts of suicide or dying should be reported to your health care professional right away. What side effects may I notice from receiving this medicine? Side effects that you should report to your doctor or health care professional as soon as possible: -allergic reactions like skin rash, itching or hives, swelling of the face, lips, tongue, or throat -acting aggressive, being angry or violent, or acting on dangerous impulses -breathing problems -changes in vision -chest pain or chest tightness -confusion, trouble speaking or understanding -new or worsening depression, anxiety, or panic attacks -extreme increase in activity and talking (mania) -fast, irregular heartbeat -feeling faint or lightheaded, falls -fever -pain in legs when walking -problems with balance, talking, walking -redness, blistering, peeling or loosening of  the skin, including inside the mouth -ringing in ears -seeing or hearing things that aren't there (hallucinations) -seizures -sleepwalking -sudden numbness or weakness of the face, arm or leg -thoughts about suicide or dying, or attempts to commit suicide -trouble passing urine or change in the amount of urine -unusual bleeding or bruising -unusually weak or tired Side effects that usually do not require medical attention (report to your doctor or health care professional if they continue or are bothersome): -constipation -headache -nausea, vomiting -strange dreams -stomach gas -trouble sleeping This list may not describe all possible side effects. Call your doctor for medical advice about side effects. You may report side effects to FDA at 1-800-FDA-1088. Where should I keep my medicine? Keep out of the reach  of children. Store at room temperature between 15 and 30 degrees C (59 and 86 degrees F). Throw away any unused medicine after the expiration date. NOTE: This sheet is a summary. It may not cover all possible information. If you have questions about this medicine, talk to your doctor, pharmacist, or health care provider.  2018 Elsevier/Gold Standard (2015-01-06 16:14:23)

## 2016-10-08 LAB — COMPREHENSIVE METABOLIC PANEL
A/G RATIO: 1.6 (ref 1.2–2.2)
ALK PHOS: 153 IU/L — AB (ref 39–117)
ALT: 15 IU/L (ref 0–32)
AST: 12 IU/L (ref 0–40)
Albumin: 4.1 g/dL (ref 3.6–4.8)
BUN/Creatinine Ratio: 32 — ABNORMAL HIGH (ref 12–28)
BUN: 24 mg/dL (ref 8–27)
Bilirubin Total: 0.3 mg/dL (ref 0.0–1.2)
CHLORIDE: 97 mmol/L (ref 96–106)
CO2: 23 mmol/L (ref 18–29)
CREATININE: 0.74 mg/dL (ref 0.57–1.00)
Calcium: 9.6 mg/dL (ref 8.7–10.3)
GFR calc Af Amer: 100 mL/min/{1.73_m2} (ref >59)
GFR calc non Af Amer: 86 mL/min/{1.73_m2} (ref >59)
GLOBULIN, TOTAL: 2.6 g/dL (ref 1.5–4.5)
Glucose: 131 mg/dL — ABNORMAL HIGH (ref 65–99)
POTASSIUM: 3.8 mmol/L (ref 3.5–5.2)
SODIUM: 140 mmol/L (ref 134–144)
Total Protein: 6.7 g/dL (ref 6.0–8.5)

## 2016-10-08 LAB — PATHOLOGIST SMEAR REVIEW
BASOS ABS: 0 10*3/uL (ref 0.0–0.2)
BASOS: 0 %
EOS (ABSOLUTE): 0.3 10*3/uL (ref 0.0–0.4)
EOS: 2 %
Hematocrit: 38.6 % (ref 34.0–46.6)
Hemoglobin: 13.1 g/dL (ref 11.1–15.9)
Immature Grans (Abs): 0 10*3/uL (ref 0.0–0.1)
Immature Granulocytes: 0 %
Lymphocytes Absolute: 3.8 10*3/uL — ABNORMAL HIGH (ref 0.7–3.1)
Lymphs: 27 %
MCH: 31.2 pg (ref 26.6–33.0)
MCHC: 33.9 g/dL (ref 31.5–35.7)
MCV: 92 fL (ref 79–97)
MONOCYTES: 5 %
Monocytes Absolute: 0.7 10*3/uL (ref 0.1–0.9)
NEUTROS ABS: 9.3 10*3/uL — AB (ref 1.4–7.0)
Neutrophils: 66 %
PATH REV RBC: NORMAL
Platelets: 428 10*3/uL — ABNORMAL HIGH (ref 150–379)
RBC: 4.2 x10E6/uL (ref 3.77–5.28)
RDW: 14 % (ref 12.3–15.4)
WBC: 14.2 10*3/uL — AB (ref 3.4–10.8)

## 2016-10-08 LAB — SEDIMENTATION RATE: SED RATE: 20 mm/h (ref 0–40)

## 2016-10-08 LAB — LIPID PANEL
CHOLESTEROL TOTAL: 154 mg/dL (ref 100–199)
Chol/HDL Ratio: 4.2 ratio (ref 0.0–4.4)
HDL: 37 mg/dL — ABNORMAL LOW (ref >39)
LDL Calculated: 84 mg/dL (ref 0–99)
TRIGLYCERIDES: 166 mg/dL — AB (ref 0–149)
VLDL Cholesterol Cal: 33 mg/dL (ref 5–40)

## 2016-10-08 LAB — C-REACTIVE PROTEIN: CRP: 16.6 mg/L — AB (ref 0.0–4.9)

## 2016-10-15 ENCOUNTER — Other Ambulatory Visit: Payer: Self-pay | Admitting: Family Medicine

## 2016-10-16 ENCOUNTER — Other Ambulatory Visit: Payer: Self-pay | Admitting: Physician Assistant

## 2016-10-16 ENCOUNTER — Other Ambulatory Visit: Payer: Self-pay | Admitting: Family Medicine

## 2016-10-18 NOTE — Addendum Note (Signed)
Addended by: Shawnee Knapp on: 10/18/2016 04:46 PM   Modules accepted: Orders

## 2016-11-05 ENCOUNTER — Encounter: Payer: Self-pay | Admitting: Vascular Surgery

## 2016-11-12 ENCOUNTER — Ambulatory Visit (INDEPENDENT_AMBULATORY_CARE_PROVIDER_SITE_OTHER)
Admission: RE | Admit: 2016-11-12 | Discharge: 2016-11-12 | Disposition: A | Discharge status: Home / Self Care | Payer: Commercial Managed Care - HMO | Source: Ambulatory Visit | Attending: Vascular Surgery | Admitting: Vascular Surgery

## 2016-11-12 ENCOUNTER — Ambulatory Visit (HOSPITAL_COMMUNITY)
Admission: RE | Admit: 2016-11-12 | Discharge: 2016-11-12 | Disposition: A | Discharge status: Home / Self Care | Payer: Commercial Managed Care - HMO | Source: Ambulatory Visit | Attending: Vascular Surgery | Admitting: Vascular Surgery

## 2016-11-12 DIAGNOSIS — I723 Aneurysm of iliac artery: Secondary | ICD-10-CM | POA: Diagnosis not present

## 2016-11-14 ENCOUNTER — Encounter: Payer: Self-pay | Admitting: Vascular Surgery

## 2016-11-14 ENCOUNTER — Ambulatory Visit (INDEPENDENT_AMBULATORY_CARE_PROVIDER_SITE_OTHER): Payer: Commercial Managed Care - HMO | Admitting: Vascular Surgery

## 2016-11-14 ENCOUNTER — Other Ambulatory Visit: Payer: Self-pay | Admitting: *Deleted

## 2016-11-14 ENCOUNTER — Encounter: Payer: Self-pay | Admitting: *Deleted

## 2016-11-14 VITALS — BP 128/70 | HR 76 | Temp 98.4°F | Resp 16 | Ht <= 58 in | Wt 174.0 lb

## 2016-11-14 DIAGNOSIS — Z48812 Encounter for surgical aftercare following surgery on the circulatory system: Secondary | ICD-10-CM

## 2016-11-14 DIAGNOSIS — I739 Peripheral vascular disease, unspecified: Secondary | ICD-10-CM

## 2016-11-14 NOTE — Progress Notes (Signed)
Patient name: Diane Giles MRN: [id number] DOB: Aug 11, 1955 Sex: female   REASON FOR CONSULT:    Hip pain. The physician requesting the consult is Dr. Charlann Boxer.  HPI:   Diane Giles is a pleasant 61 y.o. female, who underwent an aortoiliac bypass in 2000. The patient has been having hip pain and is sent for vascular consultation. This patient is undergone previous aortoiliac bypass. I have reviewed the op note from March 2000. 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 The proximal anastomosis was end to end.  She states that she has had pain in the right groin which is brought on by ambulation and relieved with rest. This has been going on for 5-1/2 years. This occurs after walking 20-30 minutes. She denies any history of hip, thigh, or calf claudication. Her symptoms are only on the right side. Her symptoms were present before her hip surgery. In addition the location of her pain is not at the site of her incision for her hip replacement.  She denies any history of rest pain or nonhealing ulcers.  Her risk factors for peripheral vascular disease include diabetes, hypertension, hypercholesterolemia, and tobacco use. She denies any family history of premature cardiovascular disease.  Past Medical History:  Diagnosis Date  . Arthritis   . Diabetes mellitus without complication (HCC)    per pt she is pre- diabetic  . Diverticulitis   . Hypertension   . Osteoporosis   . Sleep apnea     Family History  Problem Relation Age of Onset  . Cancer Father 24       Dec age 35 with pancreatic Ca  . Migraines Mother   . Thyroid disease Mother        hypothyroid    SOCIAL HISTORY: Social History   Social History  . Marital status: Married    Spouse name: N/A  . Number of children: N/A  . 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 Smoking status:  Current Every Day Smoker    Packs/day: 1.00    Years: 40.00    Types: Cigarettes  . Smokeless tobacco: Never Used     Comment: Currently smoking 1/2 ppd  . Alcohol use 0.0 oz/week     Comment: very rare--1/month  . Drug use: No  . Sexual activity: Yes    Partners: Male    Birth control/ protection: Post-menopausal   Other Topics Concern  . Not on file   Social History Narrative   Married. Patient does not exercise. She has a Geographical information systems officer.    Allergies  Allergen Reactions  . Codeine Itching    Current Outpatient Prescriptions  Medication Sig Dispense Refill  . aspirin 81 MG chewable tablet Chew 81 mg by mouth daily.    Marland Kitchen atenolol-chlorthalidone (TENORETIC) 50-25 MG tablet TAKE 1 TABLET EACH DAY. 90 tablet 1  . atorvastatin (LIPITOR) 80 MG tablet TAKE 1 TABLET AT 6PM. 90 tablet 1  . blood glucose meter kit and supplies Dispense based on patient and insurance preference. Use as directed once a day. (DX: R73.9) 1 each 0  . furosemide (LASIX) 20 MG tablet TAKE 1 TABLET ONCE DAILY. 90 tablet 1  . glucose blood (GLUCOSE METER TEST) test strip Use as instructed once a day. (DX:R73.9) 50 each 2  . Lancets MISC Use as directed once a  day. (DX: R73.9) 100 each 2  . lisinopril (PRINIVIL,ZESTRIL) 10 MG tablet TAKE 1 TABLET ONCE DAILY. 90 tablet 3  . metFORMIN (GLUCOPHAGE) 1000 MG tablet Take 1 tablet (1,000 mg total) by mouth 2 (two) times daily with a meal. 180 tablet 3  . Omega-3 Fatty Acids (OMEGA 3 PO) Take 1 capsule by mouth 2 (two) times daily.     No current facility-administered medications for this visit.     REVIEW OF SYSTEMS:  [X]  denotes positive finding, [ ]  denotes negative finding Cardiac  Comments:  Chest pain or chest pressure:    Shortness of breath upon exertion:    Short of breath when lying flat:    Irregular heart rhythm:        Vascular    Pain in calf, thigh, or hip brought on by ambulation: X   Pain in feet at night that wakes you up from your sleep:      Blood clot in your veins:    Leg swelling:         Pulmonary    Oxygen at home:    Productive cough:     Wheezing:         Neurologic    Sudden weakness in arms or legs:     Sudden numbness in arms or legs:     Sudden onset of difficulty speaking or slurred speech:    Temporary loss of vision in one eye:     Problems with dizziness:         Gastrointestinal    Blood in stool:     Vomited blood:         Genitourinary    Burning when urinating:     Blood in urine:        Psychiatric    Major depression:         Hematologic    Bleeding problems:    Problems with blood clotting too easily:        Skin    Rashes or ulcers:        Constitutional    Fever or chills:     PHYSICAL EXAM:   Vitals:   11/14/16 0908  Weight: 174 lb (78.9 kg)  Height: 4\' 10"  (1.473 m)    GENERAL: The patient is a well-nourished female, in no acute distress. The vital signs are documented above. CARDIAC: There is a regular rate and rhythm.  VASCULAR: I do not detect carotid bruits. On the right side she does have a palpable femoral pulse. This may be slightly diminished. I cannot palpate pedal pulses on the right. On the left side she has a palpable femoral, popliteal, and pedal pulses. She has no significant lower extremity swelling. PULMONARY: There is good air exchange bilaterally without wheezing or rales. ABDOMEN: Soft and non-tender with normal pitched bowel sounds.  MUSCULOSKELETAL: There are no major deformities or cyanosis. NEUROLOGIC: No focal weakness or paresthesias are detected. SKIN: There are no ulcers or rashes noted. PSYCHIATRIC: The patient has a normal affect.  DATA:    AORTOILIAC DUPLEX: I have reviewed the aortoiliac duplex Was done on 11/12/2016. This shows that there are monophasic Doppler signals in the right limb of the graft with elevated velocities in the proximal right limb of the graft. There are biphasic signals in the left limb of the graft. These findings  suggest a greater than 50% stenosis in the proximal right limb of the aortoiliac bypass graft with no significant stenosis in the left  limb.  ARTERIAL DOPPLER STUDY: I have reviewed the arterial Doppler study.  On the right side there are monophasic Doppler signals in the dorsalis pedis and posterior tibial positions with an ABI 50%. To pressure on the right is 16 mmHg.  On the left side there is a biphasic peroneal signal, triphasic posterior tibial signal, and no dorsalis pedis signal. ABI on the left is 96%. To pressure on the left is 74 mmHg.  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 This could potentially be secondary to atherosclerosis or intimal hyperplasia. If this were symptomatic I would expect her to have hip and thigh claudication, not pain in her groin. We performed her aortoiliac bypass through an abdominal incision and she did not have groin incisions. In addition duplex did not show any evidence of aneurysmal disease in the right groin. I do not think that the pain is in any way related to her hip replacement given that she was having pain along before her hip or placement.   Given the drop in ABIs on the right with a proximal stenosis, I have recommended that we proceed with arteriography. I think the risk of not evaluating this further is if this progresses she could potentially occlude the right limb of her aortoiliac bypass graft. Given her comorbidities and obesity she is not an ideal candidate for an open procedure but hopefully if there is a stenosis present here that appears to be significant, this might potentially be addressed with angioplasty. I have discussed the indications for the procedure and potential complications and she is agreeable to proceed. She has a trip coming up so this is scheduled for  12/03/2016.  Waverly Ferrari [facility] [phone]

## 2016-11-20 ENCOUNTER — Encounter: Payer: Self-pay | Admitting: Physician Assistant

## 2016-11-20 ENCOUNTER — Ambulatory Visit (INDEPENDENT_AMBULATORY_CARE_PROVIDER_SITE_OTHER): Payer: 59 | Admitting: Physician Assistant

## 2016-11-20 VITALS — BP 92/62 | HR 81 | Temp 98.2°F | Resp 17 | Ht 60.5 in | Wt 176.0 lb

## 2016-11-20 DIAGNOSIS — Z8669 Personal history of other diseases of the nervous system and sense organs: Secondary | ICD-10-CM | POA: Diagnosis not present

## 2016-11-20 DIAGNOSIS — H6983 Other specified disorders of Eustachian tube, bilateral: Secondary | ICD-10-CM | POA: Diagnosis not present

## 2016-11-20 MED ORDER — PREDNISONE 20 MG PO TABS: 40.0000 mg | ORAL_TABLET | Freq: Every day | ORAL | 0 refills | Status: DC

## 2016-11-20 NOTE — Patient Instructions (Addendum)
I would like you to take the medication as prescribed. I am referring your to ear nose and throat.  If this improved 100%, we can cancel.  But for now, it may be advantageous to proceed with specialist before these symptoms worsen.  Please await this contact.  Eustachian Tube Dysfunction The eustachian tube connects the middle ear to the back of the nose. It regulates air pressure in the middle ear by allowing air to move between the ear and nose. It also helps to drain fluid from the middle ear space. When the eustachian tube does not function properly, air pressure, fluid, or both can build up in the middle ear. Eustachian tube dysfunction can affect one or both ears. What are the causes? This condition happens when the eustachian tube becomes blocked or cannot open normally. This may result from:  Ear infections.  Colds and other upper respiratory infections.  Allergies.  Irritation, such as from cigarette smoke or acid from the stomach coming up into the esophagus (gastroesophageal reflux).  Sudden changes in air pressure, such as from descending in an airplane.  Abnormal growths in the nose or throat, such as nasal polyps, tumors, or enlarged tissue at the back of the throat (adenoids).  What increases the risk? This condition may be more likely to develop in people who smoke and people who are overweight. Eustachian tube dysfunction may also be more likely to develop in children, especially children who have:  Certain birth defects of the mouth, such as cleft palate.  Large tonsils and adenoids.  What are the signs or symptoms? Symptoms of this condition may include:  A feeling of fullness in the ear.  Ear pain.  Clicking or popping noises in the ear.  Ringing in the ear.  Hearing loss.  Loss of balance.  Symptoms may get worse when the air pressure around you changes, such as when you travel to an area of high elevation or fly on an airplane. How is this  diagnosed? This condition may be diagnosed based on:  Your symptoms.  A physical exam of your ear, nose, and throat.  Tests, such as those that measure: ? The movement of your eardrum (tympanogram). ? Your hearing (audiometry).  How is this treated? Treatment depends on the cause and severity of your condition. If your symptoms are mild, you may be able to relieve your symptoms by moving air into ("popping") your ears. If you have symptoms of fluid in your ears, treatment may include:  Decongestants.  Antihistamines.  Nasal sprays or ear drops that contain medicines that reduce swelling (steroids).  In some cases, you may need to have a procedure to drain the fluid in your eardrum (myringotomy). In this procedure, a small tube is placed in the eardrum to:  Drain the fluid.  Restore the air in the middle ear space.  Follow these instructions at home:  Take over-the-counter and prescription medicines only as told by your health care provider.  Use techniques to help pop your ears as recommended by your health care provider. These may include: ? Chewing gum. ? Yawning. ? Frequent, forceful swallowing. ? Closing your mouth, holding your nose closed, and gently blowing as if you are trying to blow air out of your nose.  Do not do any of the following until your health care provider approves: ? Travel to high altitudes. ? Fly in airplanes. ? Work in a Pension scheme manager or room. ? Scuba dive.  Keep your ears dry. Dry your ears  completely after showering or bathing.  Do not smoke.  Keep all follow-up visits as told by your health care provider. This is important. Contact a health care provider if:  Your symptoms do not go away after treatment.  Your symptoms come back after treatment.  You are unable to pop your ears.  You have: ? A fever. ? Pain in your ear. ? Pain in your head or neck. ? Fluid draining from your ear.  Your hearing suddenly changes.  You become  very dizzy.  You lose your balance. This information is not intended to replace advice given to you by your health care provider. Make sure you discuss any questions you have with your health care provider. Document Released: 05/20/2015 Document Revised: 09/29/2015 Document Reviewed: 05/12/2014 Elsevier Interactive Patient Education  2018 [employer].     IF you received an x-ray today, you will receive an invoice from [facility]. Please contact [facility] at [phone] with questions or concerns regarding your invoice.   IF you received labwork today, you will receive an invoice from [place] [facility]. Please contact [facility] at [phone] with questions or concerns regarding your invoice.   Our billing staff will not be able to assist you with questions regarding bills from these companies.  You will be contacted with the lab results as soon as they are available. The fastest way to get your results is to activate your My Chart account. Instructions are located on the last page of this paperwork. If you have not heard from [place] regarding the results in 2 weeks, please contact this office.

## 2016-11-22 NOTE — Progress Notes (Signed)
PRIMARY CARE AT [facility] [address] [place] [address] [phone]  Date:  11/20/2016   Name:  Diane Giles   DOB:  Jun 17, 1955   MRN:  [id number]  PCP:  Sherren Mocha, MD    History of Present Illness:  Diane Giles is a 61 y.o. female patient who presents to PCP with  Chief Complaint  Patient presents with  . hearing problems     --patient reports difficulty hearing, ear discomfort, and ear drainage. --initial symptoms started 2 weeks ago with cold symptoms.  She was congested with ear pain.  Pain progressively worsened 10 days ago and then relieved after drainage from her left ear.  She has noted that her hearing has not improved.  There is no pain.  The right ear feels clogged as well.  No fever.  No sore throat or cough.    Patient Active Problem List   Diagnosis Date Noted  . Centrilobular emphysema (HCC) 10/03/2016  . Postmenopausal bleeding 12/01/2015  . Endometrial thickening on ultra sound 07/28/2015  . 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 Type 2 diabetes mellitus (HCC) 05/18/2014  . Diverticulitis 08/26/2013  . Leukocytosis 08/26/2013  . Abdominal pain 08/26/2013  . DDD (degenerative disc disease) 03/18/2013  . Metabolic syndrome 07/29/2012  . Essential hypertension, benign 07/18/2012  . Hyperlipidemia 07/18/2012  . Obstructive sleep apnea 07/04/2012  . Osteoporosis 07/04/2012  . Morbid obesity (HCC) 07/04/2012  . Tobacco use disorder 07/04/2012  . Trochanteric bursitis of right hip 07/04/2012    Past Medical History:  Diagnosis Date  . Arthritis   . Diabetes mellitus without complication (HCC)    per pt she is pre- diabetic  . Diverticulitis   . Hypertension   . Osteoporosis   . Sleep apnea     Past Surgical History:  Procedure Laterality Date  . DILATATION & CURETTAGE/HYSTEROSCOPY WITH MYOSURE N/A 12/27/2015   Procedure: DILATATION & CURETTAGE/HYSTEROSCOPY;  Surgeon: Patton Salles, MD;  Location: [facility];  Service: Gynecology;   Laterality: N/A;  . DILATION AND CURETTAGE OF UTERUS  11/2015  . ELBOW SURGERY Left ~2007   tendon repair by Dr. Ranell Patrick  . EYE SURGERY Bilateral 2008   lasik  . lower aortic bypass  2000   per patient  . TOTAL HIP ARTHROPLASTY Right 07/27/2014   Procedure: RIGHT TOTAL HIP ARTHROPLASTY ANTERIOR APPROACH;  Surgeon: Durene Romans, MD;  Location: [facility];  Service: Orthopedics;  Laterality: Right;    Social History  Substance Use Topics  . Smoking status: Current Every Day Smoker    Packs/day: 1.00    Years: 40.00    Types: Cigarettes  . Smokeless tobacco: Never Used     Comment: Currently smoking 1/2 ppd  . Alcohol use 0.0 oz/week     Comment: very rare--1/month    Family History  Problem Relation Age of Onset  . Cancer Father 71       Dec age 29 with pancreatic Ca  . Migraines Mother   . Thyroid disease Mother        hypothyroid    Allergies  Allergen Reactions  . Codeine Itching    Medication list has been reviewed and updated.  Current Outpatient Prescriptions on File Prior to Visit  Medication Sig Dispense Refill  . aspirin 81 MG chewable tablet Chew 81 mg by mouth daily.    Marland Kitchen atenolol-chlorthalidone (TENORETIC) 50-25 MG tablet TAKE 1 TABLET EACH DAY. 90 tablet 1  . atorvastatin (LIPITOR) 80  MG tablet TAKE 1 TABLET AT 6PM. 90 tablet 1  . blood glucose meter kit and supplies Dispense based on patient and insurance preference. Use as directed once a day. (DX: R73.9) 1 each 0  . furosemide (LASIX) 20 MG tablet TAKE 1 TABLET ONCE DAILY. 90 tablet 1  . glucose blood (GLUCOSE METER TEST) test strip Use as instructed once a day. (DX:R73.9) 50 each 2  . Lancets MISC Use as directed once a day. (DX: R73.9) 100 each 2  . lisinopril (PRINIVIL,ZESTRIL) 10 MG tablet TAKE 1 TABLET ONCE DAILY. 90 tablet 3  . metFORMIN (GLUCOPHAGE) 1000 MG tablet Take 1 tablet (1,000 mg total) by mouth 2 (two) times daily with a meal. 180 tablet 3  . Omega-3 Fatty Acids (OMEGA 3 PO) Take 1 capsule by  mouth 2 (two) times daily.     No current facility-administered medications on file prior to visit.     ROS ROS otherwise unremarkable unless listed above.  Physical Examination: BP 92/62   Pulse 81   Temp 98.2 F (36.8 C) (Oral)   Resp 17   Ht 5' 0.5" (1.537 m)   Wt 176 lb (79.8 kg)   LMP 05/07/2005 (Approximate)   SpO2 98%   BMI 33.81 kg/m  Ideal Body Weight: Weight in (lb) to have BMI = 25: 129.9  Physical Exam  Constitutional: She is oriented to person, place, and time. She appears well-developed and well-nourished. No distress.  HENT:  Head: Normocephalic and atraumatic.  Right Ear: External ear and ear canal normal.  Left Ear: External ear and ear canal normal.  Nose: Mucosal edema present. No rhinorrhea. Right sinus exhibits no maxillary sinus tenderness and no frontal sinus tenderness. Left sinus exhibits no maxillary sinus tenderness and no frontal sinus tenderness.  Mouth/Throat: No uvula swelling. No oropharyngeal exudate, posterior oropharyngeal edema or posterior oropharyngeal erythema.  Right TM appears normal. Left tm: with wet mucus/cerumen in the canal.  Tm is without acute perforation, but appears to be slightly more translucent at the 1 oclock region.   Ac>bc Lateralization to the left affected ear.   Eyes: Pupils are equal, round, and reactive to light. Conjunctivae and EOM are normal.  Cardiovascular: Normal rate and regular rhythm.  Exam reveals no gallop, no distant heart sounds and no friction rub.   No murmur heard. Pulmonary/Chest: Effort normal. No respiratory distress. She has no decreased breath sounds. She has no wheezes. She has no rhonchi.  Lymphadenopathy:       Head (right side): No submandibular, no tonsillar, no preauricular and no posterior auricular adenopathy present.       Head (left side): No submandibular, no tonsillar, no preauricular and no posterior auricular adenopathy present.  Neurological: She is alert and oriented to person,  place, and time.  Skin: She is not diaphoretic.  Psychiatric: She has a normal mood and affect. Her behavior is normal.     Assessment and Plan: Diane Giles is a 61 y.o. female who is here today for cc of ear pain Likely perforation.  Will treat for possible eustachian tube dysfunction and inflammation.  Given prednisone.  bp is well controlled at this time.  Urged bp medication use. At this time referral to ent placed. Hx of perforation of tympanic membrane - Plan: predniSONE (DELTASONE) 20 MG tablet, Ambulatory referral to ENT  Eustachian tube dysfunction, bilateral - Plan: predniSONE (DELTASONE) 20 MG tablet, Ambulatory referral to ENT  Trena Platt, PA-C [facility] 7/19/20188:01 AM

## 2016-11-25 ENCOUNTER — Encounter (HOSPITAL_COMMUNITY): Payer: Self-pay | Admitting: Emergency Medicine

## 2016-11-25 ENCOUNTER — Emergency Department (HOSPITAL_COMMUNITY)
Admission: EM | Admit: 2016-11-25 | Discharge: 2016-11-25 | Disposition: A | Discharge status: Home / Self Care | Payer: 59 | Attending: Emergency Medicine | Admitting: Emergency Medicine

## 2016-11-25 DIAGNOSIS — E119 Type 2 diabetes mellitus without complications: Secondary | ICD-10-CM | POA: Diagnosis not present

## 2016-11-25 DIAGNOSIS — Z79899 Other long term (current) drug therapy: Secondary | ICD-10-CM | POA: Diagnosis not present

## 2016-11-25 DIAGNOSIS — Z7984 Long term (current) use of oral hypoglycemic drugs: Secondary | ICD-10-CM | POA: Insufficient documentation

## 2016-11-25 DIAGNOSIS — F17210 Nicotine dependence, cigarettes, uncomplicated: Secondary | ICD-10-CM | POA: Insufficient documentation

## 2016-11-25 DIAGNOSIS — H6121 Impacted cerumen, right ear: Secondary | ICD-10-CM

## 2016-11-25 DIAGNOSIS — I10 Essential (primary) hypertension: Secondary | ICD-10-CM | POA: Insufficient documentation

## 2016-11-25 DIAGNOSIS — H9203 Otalgia, bilateral: Secondary | ICD-10-CM | POA: Diagnosis present

## 2016-11-25 DIAGNOSIS — H6122 Impacted cerumen, left ear: Secondary | ICD-10-CM | POA: Insufficient documentation

## 2016-11-25 DIAGNOSIS — H9202 Otalgia, left ear: Secondary | ICD-10-CM

## 2016-11-25 NOTE — ED Notes (Signed)
Pt left prior to receiving dc paperwork. Pt did not notify staff. Discharge vitals were not able to be obtained

## 2016-11-25 NOTE — ED Provider Notes (Signed)
[facility] DEPT Provider Note   CSN: [id number] Arrival date & time: 11/25/16  2055     History   Chief Complaint Chief Complaint  Patient presents with  . Otalgia    HPI Diane Giles is a 61 y.o. female presenting with bilateral ear pain. She reports being seen at urgent care and getting ears flushed and given prednisone. She initially had drainage which has resolved. She reports associated hearing loss and fullness in that ear since onset. She has an appointment scheduled with ENT for follow up on Wednesday. She denies pain, but feels a fullness, "swiching around" especially in the left ear and now a foreign body sensation/fullness in the right ear. No fever, chills or other symptoms.    HPI  Past Medical History:  Diagnosis Date  . Arthritis   . Diabetes mellitus without complication ([place])    per pt she is pre- diabetic  . Diverticulitis   . Hypertension   . Osteoporosis   . Sleep apnea     Patient Active Problem List   Diagnosis Date Noted  . Centrilobular emphysema ([place]) 10/03/2016  . Postmenopausal bleeding 12/01/2015  . Endometrial thickening on ultra sound 07/28/2015  . 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 Type 2 diabetes mellitus ([place]) 05/18/2014  . Diverticulitis 08/26/2013  . Leukocytosis 08/26/2013  . Abdominal pain 08/26/2013  . DDD (degenerative disc disease) 03/18/2013  . Metabolic syndrome 02/28/8526  . Essential hypertension, benign 07/18/2012  . Hyperlipidemia 07/18/2012  . Obstructive sleep apnea 07/04/2012  . Osteoporosis 07/04/2012  . Morbid obesity ([place]) 07/04/2012  . Tobacco use disorder 07/04/2012  . Trochanteric bursitis of right hip 07/04/2012    Past Surgical History:  Procedure Laterality Date  . DILATATION & CURETTAGE/HYSTEROSCOPY WITH MYOSURE N/A 12/27/2015   Procedure: DILATATION & CURETTAGE/HYSTEROSCOPY;  Surgeon: Nunzio Cobbs, MD;  Location: [facility];  Service: Gynecology;  Laterality: N/A;  . DILATION AND  CURETTAGE OF UTERUS  11/2015  . ELBOW SURGERY Left ~2007   tendon repair by Dr. Veverly Fells  . EYE SURGERY Bilateral 2008   lasik  . lower aortic bypass  2000   per patient  . TOTAL HIP ARTHROPLASTY Right 07/27/2014   Procedure: RIGHT TOTAL HIP ARTHROPLASTY ANTERIOR APPROACH;  Surgeon: Paralee Cancel, MD;  Location: [facility];  Service: Orthopedics;  Laterality: Right;    OB History    Gravida Para Term Preterm AB Living   5 5 5     5    SAB TAB Ectopic Multiple Live Births                   Home Medications    Prior to Admission medications   Medication Sig Start Date End Date Taking? Authorizing Provider  aspirin 81 MG chewable tablet Chew 81 mg by mouth daily.    [provider]  atenolol-chlorthalidone (TENORETIC) 50-25 MG tablet TAKE 1 TABLET EACH DAY. 09/14/16   Shawnee Knapp, MD  atorvastatin (LIPITOR) 80 MG tablet TAKE 1 TABLET AT 6PM. 10/15/16   Shawnee Knapp, MD  blood glucose meter kit and supplies Dispense based on patient and insurance preference. Use as directed once a day. (DX: R73.9) 01/20/15   Shawnee Knapp, MD  furosemide (LASIX) 20 MG tablet TAKE 1 TABLET ONCE DAILY. 10/18/16   Shawnee Knapp, MD  glucose blood (GLUCOSE METER TEST) test strip Use as instructed once a day. (DX:R73.9) 01/20/15   Shawnee Knapp, MD  Lancets MISC Use as directed  once a day. (DX: R73.9) 01/20/15   Shawnee Knapp, MD  lisinopril (PRINIVIL,ZESTRIL) 10 MG tablet TAKE 1 TABLET ONCE DAILY. 10/18/16   Shawnee Knapp, MD  metFORMIN (GLUCOPHAGE) 1000 MG tablet Take 1 tablet (1,000 mg total) by mouth 2 (two) times daily with a meal. 05/10/16   Shawnee Knapp, MD  Omega-3 Fatty Acids (OMEGA 3 PO) Take 1 capsule by mouth 2 (two) times daily.    [provider]  predniSONE (DELTASONE) 20 MG tablet Take 2 tablets (40 mg total) by mouth daily with breakfast. 11/20/16   Joretta Bachelor, PA    Family History Family History  Problem Relation Age of Onset  . Cancer Father 63       Dec age 77 with pancreatic Ca  .  Migraines Mother   . Thyroid disease Mother        hypothyroid    Social History Social History  Substance Use Topics  . Smoking status: Current Every Day Smoker    Packs/day: 0.50    Years: 40.00    Types: Cigarettes  . Smokeless tobacco: Never Used     Comment: Currently smoking 1/2 ppd  . Alcohol use 0.0 oz/week     Comment: very rare--1/month     Allergies   Codeine   Review of Systems Review of Systems  Constitutional: Negative for chills and fever.  HENT: Positive for hearing loss and tinnitus. Negative for congestion, ear discharge, ear pain, facial swelling, sinus pain, sinus pressure and sore throat.        Patient reported one episode of whistling in her left ear which did not last long and resolved  Respiratory: Negative for cough.   Cardiovascular: Negative for chest pain.  Gastrointestinal: Negative for nausea and vomiting.  Musculoskeletal: Negative for myalgias, neck pain and neck stiffness.  Skin: Negative for color change, pallor, rash and wound.     Physical Exam Updated Vital Signs BP 98/61 (BP Location: Right Arm)   Pulse 77   Temp 98.2 F (36.8 C) (Oral)   Resp 16   Ht 4' 10"  (1.473 m)   Wt 79.8 kg (176 lb)   LMP 05/07/2005 (Approximate)   SpO2 99%   BMI 36.78 kg/m   Physical Exam  Constitutional: She appears well-developed and well-nourished. No distress.  Afebrile, nontoxic-appearing, sitting comfortably in chair in no acute distress.  HENT:  Head: Normocephalic and atraumatic.  Right Ear: External ear normal. No drainage. No foreign bodies. No mastoid tenderness. Tympanic membrane is not perforated, not retracted and not bulging.  Left Ear: External ear normal. No drainage. No foreign bodies. No mastoid tenderness. Tympanic membrane is not perforated, not retracted and not bulging.  Mouth/Throat: Oropharynx is clear and moist. No oropharyngeal exudate.  Eyes: Conjunctivae and EOM are normal. Right eye exhibits no discharge. Left eye  exhibits no discharge.  Neck: Normal range of motion. Neck supple.  Cardiovascular: Normal rate, regular rhythm and normal heart sounds.   No murmur heard. Pulmonary/Chest: Effort normal and breath sounds normal. No respiratory distress. She has no wheezes. She has no rales.  Abdominal: She exhibits no distension.  Musculoskeletal: Normal range of motion. She exhibits no edema.  Lymphadenopathy:    She has no cervical adenopathy.  Neurological: She is alert.  Skin: Skin is warm and dry. No rash noted. She is not diaphoretic. No erythema. No pallor.  Psychiatric: She has a normal mood and affect.  Nursing note and vitals reviewed.    ED  Treatments / Results  Labs (all labs ordered are listed, but only abnormal results are displayed) Labs Reviewed - No data to display  EKG  EKG Interpretation None       Radiology No results found.  Procedures Procedures (including critical care time) Ceruminosis is noted.  Wax is removed by syringing and manual debridement. Instructions for home care to prevent wax buildup are given.  Medications Ordered in ED Medications - No data to display   Initial Impression / Assessment and Plan / ED Course  I have reviewed the triage vital signs and the nursing notes.  Pertinent labs & imaging results that were available during my care of the patient were reviewed by me and considered in my medical decision making (see chart for details).     No auricular lymphadenopathy or swelling/erythema behind the ear. No pain with palpation of the tragus or pulling of the pinna. tempanic membranes are normal appearing and canals did not appear infected.  Patient was discussed with Dr. Wilson Singer, who agrees with exam findings.  Patient reported significant improvement in symptoms of the right ear after removing wax.  No antibiotics indicated at this time but advised patient to keep her appointment with ENT for follow-up. Patient is afebrile, nontoxic and  well-appearing prior to discharge.  Discussed strict return precautions and advised to return to the emergency department if experiencing any new or worsening symptoms. Instructions were understood and patient agreed with discharge plan.  Final Clinical Impressions(s) / ED Diagnoses   Final diagnoses:  Impacted cerumen of right ear  Left ear pain    New Prescriptions Discharge Medication List as of 11/25/2016 11:22 PM       Emeline General, PA-C 11/26/16 [id number]    Virgel Manifold, MD 12/02/16 1218

## 2016-11-25 NOTE — Discharge Instructions (Signed)
As dicussed your ear canal did not appear infected nor did your tempanic membranes. Follow up as scheduled with ENT.

## 2016-11-25 NOTE — ED Notes (Signed)
Pt from home with complaints of pressure and pain in bilateral ear pain that has been present x about 1 week. Pt states left ear pain began first followed by right ear. Pt states she had ear flushed by PCP. Pt has appointment with ENT but it is not for 2 weeks. Pt states she could not wait till then. Pt's left ear appears clear, but right ear has some waxy build up

## 2016-12-03 ENCOUNTER — Encounter (HOSPITAL_COMMUNITY): Payer: Self-pay | Admitting: Vascular Surgery

## 2016-12-03 ENCOUNTER — Other Ambulatory Visit: Payer: Self-pay | Admitting: Vascular Surgery

## 2016-12-03 ENCOUNTER — Ambulatory Visit (HOSPITAL_COMMUNITY)
Admission: RE | Admit: 2016-12-03 | Discharge: 2016-12-03 | Disposition: A | Discharge status: Home / Self Care | Payer: 59 | Source: Ambulatory Visit | Attending: Vascular Surgery | Admitting: Vascular Surgery

## 2016-12-03 ENCOUNTER — Telehealth: Payer: Self-pay | Admitting: Vascular Surgery

## 2016-12-03 ENCOUNTER — Encounter (HOSPITAL_COMMUNITY)
Admission: RE | Disposition: A | Discharge status: Home / Self Care | Payer: Self-pay | Source: Ambulatory Visit | Attending: Vascular Surgery

## 2016-12-03 DIAGNOSIS — T82858A Stenosis of vascular prosthetic devices, implants and grafts, initial encounter: Secondary | ICD-10-CM | POA: Diagnosis not present

## 2016-12-03 DIAGNOSIS — Z7984 Long term (current) use of oral hypoglycemic drugs: Secondary | ICD-10-CM | POA: Diagnosis not present

## 2016-12-03 DIAGNOSIS — Z6835 Body mass index (BMI) 35.0-35.9, adult: Secondary | ICD-10-CM | POA: Diagnosis not present

## 2016-12-03 DIAGNOSIS — I70211 Atherosclerosis of native arteries of extremities with intermittent claudication, right leg: Secondary | ICD-10-CM | POA: Insufficient documentation

## 2016-12-03 DIAGNOSIS — E669 Obesity, unspecified: Secondary | ICD-10-CM | POA: Insufficient documentation

## 2016-12-03 DIAGNOSIS — Z7982 Long term (current) use of aspirin: Secondary | ICD-10-CM | POA: Diagnosis not present

## 2016-12-03 DIAGNOSIS — M1990 Unspecified osteoarthritis, unspecified site: Secondary | ICD-10-CM | POA: Diagnosis not present

## 2016-12-03 DIAGNOSIS — Y831 Surgical operation with implant of artificial internal device as the cause of abnormal reaction of the patient, or of later complication, without mention of misadventure at the time of the procedure: Secondary | ICD-10-CM | POA: Diagnosis not present

## 2016-12-03 DIAGNOSIS — I10 Essential (primary) hypertension: Secondary | ICD-10-CM | POA: Insufficient documentation

## 2016-12-03 DIAGNOSIS — M810 Age-related osteoporosis without current pathological fracture: Secondary | ICD-10-CM | POA: Diagnosis not present

## 2016-12-03 DIAGNOSIS — G4730 Sleep apnea, unspecified: Secondary | ICD-10-CM | POA: Insufficient documentation

## 2016-12-03 DIAGNOSIS — I7409 Other arterial embolism and thrombosis of abdominal aorta: Secondary | ICD-10-CM

## 2016-12-03 DIAGNOSIS — I70401 Unspecified atherosclerosis of autologous vein bypass graft(s) of the extremities, right leg: Secondary | ICD-10-CM | POA: Diagnosis not present

## 2016-12-03 DIAGNOSIS — E1151 Type 2 diabetes mellitus with diabetic peripheral angiopathy without gangrene: Secondary | ICD-10-CM | POA: Diagnosis not present

## 2016-12-03 DIAGNOSIS — F17210 Nicotine dependence, cigarettes, uncomplicated: Secondary | ICD-10-CM | POA: Insufficient documentation

## 2016-12-03 HISTORY — PX: PERIPHERAL VASCULAR INTERVENTION: CATH118257

## 2016-12-03 HISTORY — DX: Other arterial embolism and thrombosis of abdominal aorta: I74.09

## 2016-12-03 HISTORY — PX: ABDOMINAL AORTOGRAM W/LOWER EXTREMITY: CATH118223

## 2016-12-03 LAB — POCT I-STAT, CHEM 8
BUN: 18 mg/dL (ref 6–20)
CALCIUM ION: 1.17 mmol/L (ref 1.15–1.40)
CHLORIDE: 101 mmol/L (ref 101–111)
Creatinine, Ser: 0.6 mg/dL (ref 0.44–1.00)
GLUCOSE: 152 mg/dL — AB (ref 65–99)
HCT: 38 % (ref 36.0–46.0)
Hemoglobin: 12.9 g/dL (ref 12.0–15.0)
Potassium: 3.3 mmol/L — ABNORMAL LOW (ref 3.5–5.1)
SODIUM: 139 mmol/L (ref 135–145)
TCO2: 25 mmol/L (ref 0–100)

## 2016-12-03 LAB — POCT ACTIVATED CLOTTING TIME
ACTIVATED CLOTTING TIME: 219 s
ACTIVATED CLOTTING TIME: 197 s
Activated Clotting Time: 186 seconds

## 2016-12-03 LAB — GLUCOSE, CAPILLARY
Glucose-Capillary: 145 mg/dL — ABNORMAL HIGH (ref 65–99)
Glucose-Capillary: 142 mg/dL — ABNORMAL HIGH (ref 65–99)

## 2016-12-03 SURGERY — ABDOMINAL AORTOGRAM W/LOWER EXTREMITY
Anesthesia: LOCAL

## 2016-12-03 MED ORDER — LIDOCAINE HCL (PF) 1 % IJ SOLN
INTRAMUSCULAR | Status: AC
  Filled 2016-12-03: qty 30

## 2016-12-03 MED ORDER — HEPARIN (PORCINE) IN NACL 2-0.9 UNIT/ML-% IJ SOLN
INTRAMUSCULAR | Status: AC
  Filled 2016-12-03: qty 1000

## 2016-12-03 MED ORDER — SODIUM CHLORIDE 0.9 % IV SOLN: 1.0000 mL/kg/h | INTRAVENOUS | Status: DC

## 2016-12-03 MED ORDER — MIDAZOLAM HCL 2 MG/2ML IJ SOLN
INTRAMUSCULAR | Status: AC
Start: 2016-12-03 — End: ?
  Filled 2016-12-03: qty 2

## 2016-12-03 MED ORDER — FENTANYL CITRATE (PF) 100 MCG/2ML IJ SOLN
INTRAMUSCULAR | Status: AC
  Filled 2016-12-03: qty 2

## 2016-12-03 MED ORDER — HEPARIN SODIUM (PORCINE) 1000 UNIT/ML IJ SOLN
INTRAMUSCULAR | Status: DC | PRN
  Administered 2016-12-03: 6000 [IU] via INTRAVENOUS

## 2016-12-03 MED ORDER — POTASSIUM CHLORIDE CRYS ER 20 MEQ PO TBCR
20.0000 meq | EXTENDED_RELEASE_TABLET | Freq: Once | ORAL | Status: AC
  Administered 2016-12-03: 20 meq via ORAL
  Filled 2016-12-03: qty 1

## 2016-12-03 MED ORDER — SODIUM CHLORIDE 0.9 % IV SOLN
INTRAVENOUS | Status: DC
  Administered 2016-12-03: 07:00:00 via INTRAVENOUS

## 2016-12-03 MED ORDER — CLOPIDOGREL BISULFATE 75 MG PO TABS
ORAL_TABLET | ORAL | Status: DC | PRN
  Administered 2016-12-03: 150 mg via ORAL

## 2016-12-03 MED ORDER — CLOPIDOGREL BISULFATE 75 MG PO TABS
ORAL_TABLET | ORAL | Status: AC
  Filled 2016-12-03: qty 2

## 2016-12-03 MED ORDER — CLOPIDOGREL BISULFATE 75 MG PO TABS: 75.0000 mg | ORAL_TABLET | Freq: Every day | ORAL | 5 refills | Status: DC

## 2016-12-03 MED ORDER — MIDAZOLAM HCL 2 MG/2ML IJ SOLN
INTRAMUSCULAR | Status: DC | PRN
  Administered 2016-12-03: 1 mg via INTRAVENOUS

## 2016-12-03 MED ORDER — POTASSIUM CHLORIDE CRYS ER 20 MEQ PO TBCR
EXTENDED_RELEASE_TABLET | ORAL | Status: AC
  Filled 2016-12-03: qty 1

## 2016-12-03 MED ORDER — LIDOCAINE HCL (PF) 1 % IJ SOLN
INTRAMUSCULAR | Status: DC | PRN
  Administered 2016-12-03: 30 mL via INTRADERMAL

## 2016-12-03 MED ORDER — FENTANYL CITRATE (PF) 100 MCG/2ML IJ SOLN
INTRAMUSCULAR | Status: DC | PRN
  Administered 2016-12-03: 50 ug via INTRAVENOUS

## 2016-12-03 MED ORDER — IODIXANOL 320 MG/ML IV SOLN
INTRAVENOUS | Status: DC | PRN
  Administered 2016-12-03: 170 mL via INTRAVENOUS

## 2016-12-03 MED ORDER — HEPARIN SODIUM (PORCINE) 1000 UNIT/ML IJ SOLN
INTRAMUSCULAR | Status: AC
  Filled 2016-12-03: qty 1

## 2016-12-03 SURGICAL SUPPLY — 18 items
BALLN MUSTANG 10X20X75 (BALLOONS) ×3
BALLN MUSTANG 9X20X75 (BALLOONS) ×3
BALLOON MUSTANG 10X20X75 (BALLOONS) IMPLANT
BALLOON MUSTANG 9X20X75 (BALLOONS) IMPLANT
CATH ANGIO 5F BER2 65CM (CATHETERS) ×1 IMPLANT
CATH ANGIO 5F PIGTAIL 65CM (CATHETERS) ×1 IMPLANT
DEVICE CONTINUOUS FLUSH (MISCELLANEOUS) ×1 IMPLANT
KIT ENCORE 26 ADVANTAGE (KITS) ×1 IMPLANT
KIT MICROINTRODUCER STIFF 5F (SHEATH) ×1 IMPLANT
KIT PV (KITS) ×3 IMPLANT
SHEATH BRITE TIP 7FR 35CM (SHEATH) ×1 IMPLANT
SHEATH PINNACLE 5F 10CM (SHEATH) ×1 IMPLANT
STENT VIABAHN 7X39X80 VBX (Permanent Stent) ×1 IMPLANT
SYR MEDRAD MARK V 150ML (SYRINGE) ×3 IMPLANT
TAPE RADIOPAQUE TURBO (MISCELLANEOUS) ×1 IMPLANT
TRANSDUCER W/STOPCOCK (MISCELLANEOUS) ×3 IMPLANT
TRAY PV CATH (CUSTOM PROCEDURE TRAY) ×3 IMPLANT
WIRE HITORQ VERSACORE ST 145CM (WIRE) ×1 IMPLANT

## 2016-12-03 NOTE — Telephone Encounter (Signed)
Sched labs 12/26/16 at 4:00 and MD 01/02/17 at 10:00. Spoke to pt.

## 2016-12-03 NOTE — Interval H&P Note (Signed)
History and Physical Interval Note:  12/03/2016 7:21 AM  Diane Giles  has presented today for surgery, with the diagnosis of pvd - claudication  The various methods of treatment have been discussed with the patient and family. After consideration of risks, benefits and other options for treatment, the patient has consented to  Procedure(s): Abdominal Aortogram w/Lower Extremity (N/A) as a surgical intervention .  The patient's history has been reviewed, patient examined, no change in status, stable for surgery.  I have reviewed the patient's chart and labs.  Questions were answered to the patient's satisfaction.     [name]

## 2016-12-03 NOTE — H&P (View-Only) (Signed)
Patient name: Diane Giles MRN: [id number] DOB: Aug 11, 1955 Sex: female   REASON FOR CONSULT:    Hip pain. The physician requesting the consult is Dr. Charlann Boxer.  HPI:   Diane Giles is a pleasant 61 y.o. female, who underwent an aortoiliac bypass in 2000. The patient has been having hip pain and is sent for vascular consultation. This patient is undergone previous aortoiliac bypass. I have reviewed the op note from March 2000. 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 The proximal anastomosis was end to end.  She states that she has had pain in the right groin which is brought on by ambulation and relieved with rest. This has been going on for 5-1/2 years. This occurs after walking 20-30 minutes. She denies any history of hip, thigh, or calf claudication. Her symptoms are only on the right side. Her symptoms were present before her hip surgery. In addition the location of her pain is not at the site of her incision for her hip replacement.  She denies any history of rest pain or nonhealing ulcers.  Her risk factors for peripheral vascular disease include diabetes, hypertension, hypercholesterolemia, and tobacco use. She denies any family history of premature cardiovascular disease.  Past Medical History:  Diagnosis Date  . Arthritis   . Diabetes mellitus without complication (HCC)    per pt she is pre- diabetic  . Diverticulitis   . Hypertension   . Osteoporosis   . Sleep apnea     Family History  Problem Relation Age of Onset  . Cancer Father 24       Dec age 35 with pancreatic Ca  . Migraines Mother   . Thyroid disease Mother        hypothyroid    SOCIAL HISTORY: Social History   Social History  . Marital status: Married    Spouse name: N/A  . Number of children: N/A  . 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 Smoking status:  Current Every Day Smoker    Packs/day: 1.00    Years: 40.00    Types: Cigarettes  . Smokeless tobacco: Never Used     Comment: Currently smoking 1/2 ppd  . Alcohol use 0.0 oz/week     Comment: very rare--1/month  . Drug use: No  . Sexual activity: Yes    Partners: Male    Birth control/ protection: Post-menopausal   Other Topics Concern  . Not on file   Social History Narrative   Married. Patient does not exercise. She has a Geographical information systems officer.    Allergies  Allergen Reactions  . Codeine Itching    Current Outpatient Prescriptions  Medication Sig Dispense Refill  . aspirin 81 MG chewable tablet Chew 81 mg by mouth daily.    Marland Kitchen atenolol-chlorthalidone (TENORETIC) 50-25 MG tablet TAKE 1 TABLET EACH DAY. 90 tablet 1  . atorvastatin (LIPITOR) 80 MG tablet TAKE 1 TABLET AT 6PM. 90 tablet 1  . blood glucose meter kit and supplies Dispense based on patient and insurance preference. Use as directed once a day. (DX: R73.9) 1 each 0  . furosemide (LASIX) 20 MG tablet TAKE 1 TABLET ONCE DAILY. 90 tablet 1  . glucose blood (GLUCOSE METER TEST) test strip Use as instructed once a day. (DX:R73.9) 50 each 2  . Lancets MISC Use as directed once a  day. (DX: R73.9) 100 each 2  . lisinopril (PRINIVIL,ZESTRIL) 10 MG tablet TAKE 1 TABLET ONCE DAILY. 90 tablet 3  . metFORMIN (GLUCOPHAGE) 1000 MG tablet Take 1 tablet (1,000 mg total) by mouth 2 (two) times daily with a meal. 180 tablet 3  . Omega-3 Fatty Acids (OMEGA 3 PO) Take 1 capsule by mouth 2 (two) times daily.     No current facility-administered medications for this visit.     REVIEW OF SYSTEMS:  [X]  denotes positive finding, [ ]  denotes negative finding Cardiac  Comments:  Chest pain or chest pressure:    Shortness of breath upon exertion:    Short of breath when lying flat:    Irregular heart rhythm:        Vascular    Pain in calf, thigh, or hip brought on by ambulation: X   Pain in feet at night that wakes you up from your sleep:      Blood clot in your veins:    Leg swelling:         Pulmonary    Oxygen at home:    Productive cough:     Wheezing:         Neurologic    Sudden weakness in arms or legs:     Sudden numbness in arms or legs:     Sudden onset of difficulty speaking or slurred speech:    Temporary loss of vision in one eye:     Problems with dizziness:         Gastrointestinal    Blood in stool:     Vomited blood:         Genitourinary    Burning when urinating:     Blood in urine:        Psychiatric    Major depression:         Hematologic    Bleeding problems:    Problems with blood clotting too easily:        Skin    Rashes or ulcers:        Constitutional    Fever or chills:     PHYSICAL EXAM:   Vitals:   11/14/16 0908  Weight: 174 lb (78.9 kg)  Height: 4\' 10"  (1.473 m)    GENERAL: The patient is a well-nourished female, in no acute distress. The vital signs are documented above. CARDIAC: There is a regular rate and rhythm.  VASCULAR: I do not detect carotid bruits. On the right side she does have a palpable femoral pulse. This may be slightly diminished. I cannot palpate pedal pulses on the right. On the left side she has a palpable femoral, popliteal, and pedal pulses. She has no significant lower extremity swelling. PULMONARY: There is good air exchange bilaterally without wheezing or rales. ABDOMEN: Soft and non-tender with normal pitched bowel sounds.  MUSCULOSKELETAL: There are no major deformities or cyanosis. NEUROLOGIC: No focal weakness or paresthesias are detected. SKIN: There are no ulcers or rashes noted. PSYCHIATRIC: The patient has a normal affect.  DATA:    AORTOILIAC DUPLEX: I have reviewed the aortoiliac duplex Was done on 11/12/2016. This shows that there are monophasic Doppler signals in the right limb of the graft with elevated velocities in the proximal right limb of the graft. There are biphasic signals in the left limb of the graft. These findings  suggest a greater than 50% stenosis in the proximal right limb of the aortoiliac bypass graft with no significant stenosis in the left  limb.  ARTERIAL DOPPLER STUDY: I have reviewed the arterial Doppler study.  On the right side there are monophasic Doppler signals in the dorsalis pedis and posterior tibial positions with an ABI 50%. To pressure on the right is 16 mmHg.  On the left side there is a biphasic peroneal signal, triphasic posterior tibial signal, and no dorsalis pedis signal. ABI on the left is 96%. To pressure on the left is 74 mmHg.  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 This could potentially be secondary to atherosclerosis or intimal hyperplasia. If this were symptomatic I would expect her to have hip and thigh claudication, not pain in her groin. We performed her aortoiliac bypass through an abdominal incision and she did not have groin incisions. In addition duplex did not show any evidence of aneurysmal disease in the right groin. I do not think that the pain is in any way related to her hip replacement given that she was having pain along before her hip or placement.   Given the drop in ABIs on the right with a proximal stenosis, I have recommended that we proceed with arteriography. I think the risk of not evaluating this further is if this progresses she could potentially occlude the right limb of her aortoiliac bypass graft. Given her comorbidities and obesity she is not an ideal candidate for an open procedure but hopefully if there is a stenosis present here that appears to be significant, this might potentially be addressed with angioplasty. I have discussed the indications for the procedure and potential complications and she is agreeable to proceed. She has a trip coming up so this is scheduled for  12/03/2016.  Waverly Ferrari [facility] [place] [phone]

## 2016-12-03 NOTE — Op Note (Signed)
NAME: Diane Giles    MRN: [id number] DOB: March 29, 1956    DATE OF OPERATION: 12/03/2016  PREOP DIAGNOSIS:    Right iliac artery stenosis  POSTOP DIAGNOSIS:    Same  PROCEDURE:    1. Ultrasound-guided access to the right common femoral artery 2. Aortogram with bilateral iliac arteriogram 3. Angioplasty and stenting of an anastomotic stenosis between the right limb of an aortoiliac graft and the right common iliac artery 4. Post dilatation with 9 mm x 20 mm balloon and 10 mm x 20 mm balloon 5. Bilateral lower extremity runoff  SURGEON: Di Kindle. Edilia Bo, MD, FACS  ANESTHESIA: Local with sedation   EBL: Minimal  INDICATIONS:    Diane Giles is a 61 y.o. female who had an aortobiiliac graft in 2000. She presented with right lower extremity claudication and a drop in her ABI to 50%. She was found to have a stenosis in the right limb of her aortoiliac graft. She present for arteriography and possible dimension.  FINDINGS:    There was an 80% stenosis where the right limb of the aortoiliac graft was anastomosed to the common iliac artery. This was successfully ballooned and stented with a VBX Gore covered stent. The initial stenosis was 80%. At the completion was no residual stenosis.  1. There are single renal arteries bilaterally with no significant renal artery stenosis then identified. 2. The aortoiliac by iliac bypass graft is widely patent. There is a 80% stenosis where the right limb of the aortic graft is sewn into into the common iliac artery. This was successfully ballooned and stented as described above. 3. Below this, bilateral external iliac arteries, common femoral arteries, deep femoral arteries, superficial femoral arteries, popliteal arteries, anterior tibial arteries, peroneal arteries, and posterior tibial arteries are patent.  TECHNIQUE:    The patient was taken to the peripheral vascular lab and was sedated. The period of conscious sedation was 63  minutes minutes.  During that time period, I was present face-to-face 100% of the time.  The patient was administered 1 mg of Versed and 50 g of fentanyl.. The patient's heart rate, blood pressure, and oxygen saturation were monitored by the nurse continuously during the procedure.  Both groins were prepped and draped in usual sterile fashion. Under ultrasound guidance, after the skin was anesthetized, the right common femoral artery was cannulated with a micro-puncture needle and micropuncture sheath introduced over a wire. This was exchanged for a 5 French sheath over a versa core wire. A pigtail catheter was positioned at the L1 vertebral body and flush aortogram obtained. The catheter was in position above the bifurcation and oblique iliac projection was obtained. I had also obtained a lateral projection.  There was an 80% stenosis where the right limb of the aortoiliac graft was anastomosed to the common iliac artery. I elected to address this with a covered stent. The 5 French sheath was exchanged for a long 6 [place] sheath over a wire. The patient was then heparinized. ACT was 219. I then selected a 7 mm x 39 mmVBX Gore covered stent. This was positioned across the stenosis and the balloon inflated to nominal pressure for 1 minute. Given the post stenotic dilatation I then went back with a 9 mm x 20 mm balloon to conform the distal aspect of the stent to the larger common iliac artery. This was inflated to nominal pressure. I then shot a film which showed still some size mismatch and went back with a 10  mm x 20 mm balloon and the distal aspect of the stent was ballooned. Completion film showed an excellent result.  Pigtail catheter was then positioned in the infrarenal aorta and bilateral lower extremity runoff films were obtained. At the completion of the procedure, the pigtail catheter was removed over a wire. The patient was transferred to the holding area for removal of the sheath. No immediate  complications were noted.  The patient received a total of 170 cc of Visipaque throughout the procedure.   Waverly Ferrari, MD, FACS [facility]  DATE OF DICTATION:   12/03/2016

## 2016-12-03 NOTE — Telephone Encounter (Signed)
-----   Message from Mena Goes, RN sent at 12/03/2016 11:02 AM EDT ----- Regarding: 3-4 weeks w/ ABI   ----- Message ----- From: Angelia Mould, MD Sent: 12/03/2016   9:19 AM To: [facility] Charge Pool Subject: charge and f/u                                 PROCEDURE:   1. Ultrasound-guided access to the right common femoral artery 2. Aortogram with bilateral iliac arteriogram 3. Angioplasty and stenting of an anastomotic stenosis between the right limb of an aortoiliac graft and the right common iliac artery 4. Post dilatation with 9 mm x 20 mm balloon and 10 mm x 20 mm balloon 5. Bilateral lower extremity runoff  SURGEON: Judeth Cornfield. Scot Dock, MD, FACS  She will need a follow up visit in 3-4 weeks with ABIs. Thank you. CD

## 2016-12-03 NOTE — Progress Notes (Signed)
K+3.3 Anderson Malta RN cath lab aware, no orders followed.

## 2016-12-03 NOTE — Discharge Instructions (Signed)

## 2016-12-03 NOTE — Progress Notes (Signed)
Site area: RFA Site Prior to Removal:  Level 0 Pressure Applied For:20 minutes Manual:   yes Patient Status During Pull:  stable Post Pull Site:  Level0 Post Pull Instructions Given:  yes Post Pull Pulses Present: yes Dressing Applied:  Sterile occlusive dressing Bedrest begins @ 10am Comments:patient tolerated well.

## 2016-12-04 MED FILL — Heparin Sodium (Porcine) 2 Unit/ML in Sodium Chloride 0.9%: INTRAMUSCULAR | Qty: 1000 | Status: AC

## 2016-12-14 NOTE — Addendum Note (Signed)
Addended by: Lianne Cure A on: 12/14/2016 03:59 PM   Modules accepted: Orders

## 2016-12-17 ENCOUNTER — Encounter: Payer: Self-pay | Admitting: Family Medicine

## 2016-12-17 ENCOUNTER — Encounter: Payer: Self-pay | Admitting: Vascular Surgery

## 2016-12-18 ENCOUNTER — Ambulatory Visit (HOSPITAL_COMMUNITY)
Admission: RE | Admit: 2016-12-18 | Discharge: 2016-12-18 | Disposition: A | Discharge status: Home / Self Care | Payer: 59 | Source: Ambulatory Visit | Attending: Vascular Surgery | Admitting: Vascular Surgery

## 2016-12-18 DIAGNOSIS — Z48812 Encounter for surgical aftercare following surgery on the circulatory system: Secondary | ICD-10-CM

## 2016-12-18 DIAGNOSIS — I739 Peripheral vascular disease, unspecified: Secondary | ICD-10-CM | POA: Diagnosis not present

## 2016-12-21 ENCOUNTER — Encounter: Payer: Self-pay | Admitting: Vascular Surgery

## 2016-12-26 ENCOUNTER — Encounter (HOSPITAL_COMMUNITY): Payer: Commercial Managed Care - HMO

## 2017-01-02 ENCOUNTER — Encounter: Payer: Self-pay | Admitting: Vascular Surgery

## 2017-01-02 ENCOUNTER — Ambulatory Visit (INDEPENDENT_AMBULATORY_CARE_PROVIDER_SITE_OTHER): Payer: 59 | Admitting: Vascular Surgery

## 2017-01-02 VITALS — BP 113/64 | HR 80 | Temp 97.2°F | Ht 59.0 in | Wt 174.8 lb

## 2017-01-02 DIAGNOSIS — I7409 Other arterial embolism and thrombosis of abdominal aorta: Secondary | ICD-10-CM | POA: Diagnosis not present

## 2017-01-02 DIAGNOSIS — Z48812 Encounter for surgical aftercare following surgery on the circulatory system: Secondary | ICD-10-CM

## 2017-01-02 NOTE — Progress Notes (Signed)
Patient name: Diane Giles MRN: [id number] DOB: [birth year] Sex: female  REASON FOR VISIT:    Follow up after angioplasty and stenting of iliac artery stenosis.  HPI:   Diane Giles is a pleasant 61 y.o. female who had an aortobiiliac bypass in 2000. She presented with right lower extremity claudication and her drop in her ABI to 50%. She was found to have a stenosis in the right limb of her aortoiliac graft.  She was taken to the peripheral vascular lab on 12/03/2016 and underwent angioplasty and stenting of an anastomotic stenosis between the right limb of her aortoiliac graft and the right common iliac artery. I used a 7 mm x 39 mm VBX Gore covered stent. There was a poststenotic dilatation and I therefore went back with a 9 mm x 20 mm balloon to conform the distal aspect of the stent to the larger common iliac artery.  She comes for her first outpatient visit. She is doing amazingly well and her symptoms have completely resolved. She is very Adult nurse. She is trying to quit smoking and is going to get a prescription for Chantix.  Current Outpatient Prescriptions  Medication Sig Dispense Refill  . aspirin 81 MG chewable tablet Chew 81 mg by mouth daily.    Marland Kitchen atenolol-chlorthalidone (TENORETIC) 50-25 MG tablet TAKE 1 TABLET EACH DAY. 90 tablet 1  . atorvastatin (LIPITOR) 80 MG tablet TAKE 1 TABLET AT 6PM. 90 tablet 1  . blood glucose meter kit and supplies Dispense based on patient and insurance preference. Use as directed once a day. (DX: R73.9) 1 each 0  . clopidogrel (PLAVIX) 75 MG tablet Take 1 tablet (75 mg total) by mouth daily. 30 tablet 5  . furosemide (LASIX) 20 MG tablet TAKE 1 TABLET ONCE DAILY. 90 tablet 1  . glucose blood (GLUCOSE METER TEST) test strip Use as instructed once a day. (DX:R73.9) 50 each 2  . Lancets MISC Use as directed once a day. (DX: R73.9) 100 each 2  . lisinopril (PRINIVIL,ZESTRIL) 10 MG tablet TAKE 1 TABLET ONCE DAILY. 90 tablet 3  . metFORMIN  (GLUCOPHAGE) 1000 MG tablet Take 1 tablet (1,000 mg total) by mouth 2 (two) times daily with a meal. 180 tablet 3  . Omega-3 Fatty Acids (OMEGA 3 PO) Take 1 capsule by mouth 2 (two) times daily.     No current facility-administered medications for this visit.     REVIEW OF SYSTEMS:  [X]  denotes positive finding, [ ]  denotes negative finding Cardiac  Comments:  Chest pain or chest pressure:    Shortness of breath upon exertion:    Short of breath when lying flat:    Irregular heart rhythm:    Constitutional    Fever or chills:     PHYSICAL EXAM:   Vitals:   01/02/17 1015  Weight: 174 lb 12.8 oz (79.3 kg)  Height: 4\' 11"  (1.499 m)    GENERAL: The patient is a well-nourished female, in no acute distress. The vital signs are documented above. CARDIOVASCULAR: There is a regular rate and rhythm. PULMONARY: There is good air exchange bilaterally without wheezing or rales. She has palpable femoral pulses. Both feet are warm and well-perfused.  DATA:   ARTERIAL DOPPLER STUDY: I reviewed her arterial Doppler study that was done on 12/18/2016. This shows triphasic Doppler signals in the dorsalis pedis and posterior tibial positions bilaterally with an ABI 100% on the right and 98% on the left.  MEDICAL ISSUES:   STATUS POST ANGIOPLASTY  AND STENTING OF THE RIGHT LIMB OF AORTOBIILIAC BYPASS GRAFT: The patient is doing very well status post angioplasty of an anastomotic stenosis with a covered stent. Her symptoms have completely resolved. We have discussed the importance of tobacco cessation. I have encouraged her to stay as active as possible. We have discussed the importance of nutrition. We'll see her back in 6 months with ABIs and a duplex of her aortoiliac system. She knows to call sooner if she has problems.  Waverly Ferrari [facility] [place] [phone]

## 2017-01-09 NOTE — Addendum Note (Signed)
Addended by: Lianne Cure A on: 01/09/2017 01:17 PM   Modules accepted: Orders

## 2017-02-03 NOTE — Progress Notes (Signed)
 Subjective:    Patient ID: Diane Giles, female    DOB: 07/12/1955, 61 y.o.   MRN: [id number]    No chief complaint on file.   HPI  Diane Giles is a 61 yo woman who is here for a 4 mo f/u on her chronic medical conditions inc DMII.  DMII: Diagnosed .   Lab Results  Component Value Date   HGBA1C 6.4 10/04/2016   HGBA1C 6.3 (H) 05/31/2016   HGBA1C 6.9  05/10/2016   CBGs: fasting a.m. ; after meal  ; not checking sugars as doesn't like pricking finger. No hypoglycemic episodes.  Meter type: has a butterfly on it Diet:  Pt declined DM education prior. Has started doing a protein shake for breakfast Exercising: Has started going back to gym and doing 1-2 miles on the treadmill!! Has been enjoying chasing the grandkids more  DM Med Regimen: metformin 1g bid w/ great med compliance Prior changes: 05/10/16 - metformin 500 bid increased   eGFR:  Baseline Cr: 0.74  Last checked 10/04/16. Microalb: 05/10/16 +. lisinopril started 05/10/16 - but has developed dry cough in the past 2 mos - more in a.m. or late at night. No other URI or pulm sxs. Lipids:  10/04/16 LDL 84,  non-HDL 117.  Last levels were improving from prior (93; 141 respectively). On statin. Taking asa 81 qd - and now started on plavix for 6 mos w/ very easy bruising.  Optho: h/o lasik surgery on Rt eye with Dr. Stonecipher so seen in f/u periodically - last 08/2016 - told her eyes look good though baseline expected cataract.  07/19/15 Dr. Bradley Bowen at [facility]. Feet: Monofilament exam done 10/04/2016. Denies any no problems.  Not seen by podiatry prior.  Immunizations:  Influenza: refuses  Pneumovax-23: 05/31/2016  Right hip pain: Had protracted course including Rt hip replacement, mult speciality eval inc ortho and spine inc referral to [facility], SI joint injection, detailed imaging, etc still w/o poss diagnosis or relief. However, this spring found she had peripheral vascular disease that was causing it.   PAD: On plavix 75. Saw  Dr. Dickson 7/11 with Rt lower ext worsening proximal stenosis so had abd aortogram/arteriography 7/30 w/ angioplasty and stenting which resolved her Rt hip and RLExt pain entirely almost immed. H/o aortoiliac bypass graft. Recheck in 6 mos.  Inflammatory factors were checked due to persistent right hip pain - were elevated so referred pt to [facility]. Synovial fluid analysis was neg for crystals or cells.   Lab Results  Component Value Date   CRP 16.6 (H) 10/04/2016   CRP 21.3 (H) 05/10/2016   nml <5. Due to difference in normal ranges, this elevation is approx equivalent to prior CRP elevations 8-01/2016 and was mildly elevated at 2-2.3 with normal of less than 0.6 done at [facility] 2017 Lab Results  Component Value Date   ESRSEDRATE 20 10/04/2016   ESRSEDRATE 43 (H) 05/10/2016   ESRSEDRATE 32 (H) 06/09/2007  And ESR elevated at 66-70 at [facility] last year  Alk phos is now starting to be elev (normal in June, nml 3 phase bone scan in Aug at [facility]) but was sig improved to mild elevation on recheck 3 wks later.  Lab Results  Component Value Date   ALKPHOS 153 (H) 10/04/2016   ALKPHOS 139 (H) 05/31/2016   ALKPHOS 175 (H) 05/10/2016   ALKPHOS 118 10/27/2015   Tob use: Had her screening lung CT 07/04/16 - no nodules but did show centrilobular emphysema. Currently   Subjective:    Patient ID: Diane Giles, female    DOB: [birth year], 61 y.o.   MRN: [id number]    No chief complaint on file.   HPI  Diane Giles is a 61 yo woman who is here for a 4 mo f/u on her chronic medical conditions inc DMII.  DMII: Diagnosed .   Lab Results  Component Value Date   HGBA1C 6.4 10/04/2016   HGBA1C 6.3 (H) 05/31/2016   HGBA1C 6.9  05/10/2016   CBGs: fasting a.m. ; after meal  ; not checking sugars as doesn't like pricking finger. No hypoglycemic episodes.  Meter type: has a butterfly on it Diet:  Pt declined DM education prior. Has started doing a protein shake for breakfast Exercising: Has started going back to gym and doing 1-2 miles on the treadmill!! Has been enjoying chasing the grandkids more  DM Med Regimen: metformin 1g bid w/ great med compliance Prior changes: 05/10/16 - metformin 500 bid increased   eGFR:  Baseline Cr: 0.74  Last checked 10/04/16. Microalb: 05/10/16 +. lisinopril started 05/10/16 - but has developed dry cough in the past 2 mos - more in a.m. or late at night. No other URI or pulm sxs. Lipids:  10/04/16 LDL 84,  non-HDL 117.  Last levels were improving from prior (93; 141 respectively). On statin. Taking asa 81 qd - and now started on plavix for 6 mos w/ very easy bruising.  Optho: h/o lasik surgery on Rt eye with Dr. Lucita Ferrara so seen in f/u periodically - last 08/2016 - told her eyes look good though baseline expected cataract.  07/19/15 Dr. Jola Schmidt at [facility] Feet: Monofilament exam done 10/04/2016. Denies any no problems.  Not seen by podiatry prior.  Immunizations:  Influenza: refuses  Pneumovax-23: 05/31/2016  Right hip pain: Had protracted course including Rt hip replacement, mult speciality eval inc ortho and spine inc referral to [facility], SI joint injection, detailed imaging, etc still w/o poss diagnosis or relief. However, this spring found she had peripheral vascular disease that was causing it.   PAD: On plavix 75. Saw  Dr. Scot Dock 7/11 with Rt lower ext worsening proximal stenosis so had abd aortogram/arteriography 7/30 w/ angioplasty and stenting which resolved her Rt hip and RLExt pain entirely almost immed. H/o aortoiliac bypass graft. Recheck in 6 mos.  Inflammatory factors were checked due to persistent right hip pain - were elevated so referred pt to [facility]. Synovial fluid analysis was neg for crystals or cells.   Lab Results  Component Value Date   CRP 16.6 (H) 10/04/2016   CRP 21.3 (H) 05/10/2016   nml <5. Due to difference in normal ranges, this elevation is approx equivalent to prior CRP elevations 8-01/2016 and was mildly elevated at 2-2.3 with normal of less than 0.6 done at [facility] 2017 Lab Results  Component Value Date   ESRSEDRATE 20 10/04/2016   ESRSEDRATE 43 (H) 05/10/2016   ESRSEDRATE 32 (H) 06/09/2007  And ESR elevated at 66-70 at [facility] last year  Alk phos is now starting to be elev (normal in June, nml 3 phase bone scan in Aug at [facility]) but was sig improved to mild elevation on recheck 3 wks later.  Lab Results  Component Value Date   ALKPHOS 153 (H) 10/04/2016   ALKPHOS 139 (H) 05/31/2016   ALKPHOS 175 (H) 05/10/2016   ALKPHOS 118 10/27/2015   Tob use: Had her screening lung CT 07/04/16 - no nodules but did show centrilobular emphysema. Currently   Subjective:    Patient ID: Elaijah C Trentham, female    DOB: 12/23/1955, 61 y.o.   MRN: [id number]    No chief complaint on file.   HPI  Ayerim is a 61 yo woman who is here for a 4 mo f/u on her chronic medical conditions inc DMII.  DMII: Diagnosed .   Lab Results  Component Value Date   HGBA1C 6.4 10/04/2016   HGBA1C 6.3 (H) 05/31/2016   HGBA1C 6.9  05/10/2016   CBGs: fasting a.m. ; after meal  ; not checking sugars as doesn't like pricking finger. No hypoglycemic episodes.  Meter type: has a butterfly on it Diet:  Pt declined DM education prior. Has started doing a protein shake for breakfast Exercising: Has started going back to gym and doing 1-2 miles on the treadmill!! Has been enjoying chasing the grandkids more  DM Med Regimen: metformin 1g bid w/ great med compliance Prior changes: 05/10/16 - metformin 500 bid increased   eGFR:  Baseline Cr: 0.74  Last checked 10/04/16. Microalb: 05/10/16 +. lisinopril started 05/10/16 - but has developed dry cough in the past 2 mos - more in a.m. or late at night. No other URI or pulm sxs. Lipids:  10/04/16 LDL 84,  non-HDL 117.  Last levels were improving from prior (93; 141 respectively). On statin. Taking asa 81 qd - and now started on plavix for 6 mos w/ very easy bruising.  Optho: h/o lasik surgery on Rt eye with Dr. Stonecipher so seen in f/u periodically - last 08/2016 - told her eyes look good though baseline expected cataract.  07/19/15 Dr. Bradley Bowen at [facility]. Feet: Monofilament exam done 10/04/2016. Denies any no problems.  Not seen by podiatry prior.  Immunizations:  Influenza: refuses  Pneumovax-23: 05/31/2016  Right hip pain: Had protracted course including Rt hip replacement, mult speciality eval inc ortho and spine inc referral to [facility], SI joint injection, detailed imaging, etc still w/o poss diagnosis or relief. However, this spring found she had peripheral vascular disease that was causing it.   PAD: On plavix 75. Saw  Dr. Dickson 7/11 with Rt lower ext worsening proximal stenosis so had abd aortogram/arteriography 7/30 w/ angioplasty and stenting which resolved her Rt hip and RLExt pain entirely almost immed. H/o aortoiliac bypass graft. Recheck in 6 mos.  Inflammatory factors were checked due to persistent right hip pain - were elevated so referred pt to [facility]. Synovial fluid analysis was neg for crystals or cells.   Lab Results  Component Value Date   CRP 16.6 (H) 10/04/2016   CRP 21.3 (H) 05/10/2016   nml <5. Due to difference in normal ranges, this elevation is approx equivalent to prior CRP elevations 8-01/2016 and was mildly elevated at 2-2.3 with normal of less than 0.6 done at [facility] 2017 Lab Results  Component Value Date   ESRSEDRATE 20 10/04/2016   ESRSEDRATE 43 (H) 05/10/2016   ESRSEDRATE 32 (H) 06/09/2007  And ESR elevated at 66-70 at [facility] last year  Alk phos is now starting to be elev (normal in June, nml 3 phase bone scan in Aug at [facility]) but was sig improved to mild elevation on recheck 3 wks later.  Lab Results  Component Value Date   ALKPHOS 153 (H) 10/04/2016   ALKPHOS 139 (H) 05/31/2016   ALKPHOS 175 (H) 05/10/2016   ALKPHOS 118 10/27/2015   Tob use: Had her screening lung CT 07/04/16 - no nodules but did show centrilobular emphysema. Currently   Subjective:    Patient ID: Diane Giles, female    DOB: 1955/07/10, 61 y.o.   MRN: [id number]    No chief complaint on file.   HPI  Diane Giles is a 61 yo woman who is here for a 4 mo f/u on her chronic medical conditions inc DMII.  DMII: Diagnosed .   Lab Results  Component Value Date   HGBA1C 6.4 10/04/2016   HGBA1C 6.3 (H) 05/31/2016   HGBA1C 6.9  05/10/2016   CBGs: fasting a.m. ; after meal  ; not checking sugars as doesn't like pricking finger. No hypoglycemic episodes.  Meter type: has a butterfly on it Diet:  Pt declined DM education prior. Has started doing a protein shake for breakfast Exercising: Has started going back to gym and doing 1-2 miles on the treadmill!! Has been enjoying chasing the grandkids more  DM Med Regimen: metformin 1g bid w/ great med compliance Prior changes: 05/10/16 - metformin 500 bid increased   eGFR:  Baseline Cr: 0.74  Last checked 10/04/16. Microalb: 05/10/16 +. lisinopril started 05/10/16 - but has developed dry cough in the past 2 mos - more in a.m. or late at night. No other URI or pulm sxs. Lipids:  10/04/16 LDL 84,  non-HDL 117.  Last levels were improving from prior (93; 141 respectively). On statin. Taking asa 81 qd - and now started on plavix for 6 mos w/ very easy bruising.  Optho: h/o lasik surgery on Rt eye with Dr. Lucita Ferrara so seen in f/u periodically - last 08/2016 - told her eyes look good though baseline expected cataract.  07/19/15 Dr. Jola Schmidt at [facility]. Feet: Monofilament exam done 10/04/2016. Denies any no problems.  Not seen by podiatry prior.  Immunizations:  Influenza: refuses  Pneumovax-23: 05/31/2016  Right hip pain: Had protracted course including Rt hip replacement, mult speciality eval inc ortho and spine inc referral to [facility], SI joint injection, detailed imaging, etc still w/o poss diagnosis or relief. However, this spring found she had peripheral vascular disease that was causing it.   PAD: On plavix 75. Saw  Dr. Scot Dock 7/11 with Rt lower ext worsening proximal stenosis so had abd aortogram/arteriography 7/30 w/ angioplasty and stenting which resolved her Rt hip and RLExt pain entirely almost immed. H/o aortoiliac bypass graft. Recheck in 6 mos.  Inflammatory factors were checked due to persistent right hip pain - were elevated so referred pt to [facility]. Synovial fluid analysis was neg for crystals or cells.   Lab Results  Component Value Date   CRP 16.6 (H) 10/04/2016   CRP 21.3 (H) 05/10/2016   nml <5. Due to difference in normal ranges, this elevation is approx equivalent to prior CRP elevations 8-01/2016 and was mildly elevated at 2-2.3 with normal of less than 0.6 done at [facility] 2017 Lab Results  Component Value Date   ESRSEDRATE 20 10/04/2016   ESRSEDRATE 43 (H) 05/10/2016   ESRSEDRATE 32 (H) 06/09/2007  And ESR elevated at 66-70 at [facility] last year  Alk phos is now starting to be elev (normal in June, nml 3 phase bone scan in Aug at [facility]) but was sig improved to mild elevation on recheck 3 wks later.  Lab Results  Component Value Date   ALKPHOS 153 (H) 10/04/2016   ALKPHOS 139 (H) 05/31/2016   ALKPHOS 175 (H) 05/10/2016   ALKPHOS 118 10/27/2015   Tob use: Had her screening lung CT 07/04/16 - no nodules but did show centrilobular emphysema. Currently  Subjective:    Patient ID: Diane Giles, female    DOB: 1955/07/10, 61 y.o.   MRN: [id number]    No chief complaint on file.   HPI  Diane Giles is a 61 yo woman who is here for a 4 mo f/u on her chronic medical conditions inc DMII.  DMII: Diagnosed .   Lab Results  Component Value Date   HGBA1C 6.4 10/04/2016   HGBA1C 6.3 (H) 05/31/2016   HGBA1C 6.9  05/10/2016   CBGs: fasting a.m. ; after meal  ; not checking sugars as doesn't like pricking finger. No hypoglycemic episodes.  Meter type: has a butterfly on it Diet:  Pt declined DM education prior. Has started doing a protein shake for breakfast Exercising: Has started going back to gym and doing 1-2 miles on the treadmill!! Has been enjoying chasing the grandkids more  DM Med Regimen: metformin 1g bid w/ great med compliance Prior changes: 05/10/16 - metformin 500 bid increased   eGFR:  Baseline Cr: 0.74  Last checked 10/04/16. Microalb: 05/10/16 +. lisinopril started 05/10/16 - but has developed dry cough in the past 2 mos - more in a.m. or late at night. No other URI or pulm sxs. Lipids:  10/04/16 LDL 84,  non-HDL 117.  Last levels were improving from prior (93; 141 respectively). On statin. Taking asa 81 qd - and now started on plavix for 6 mos w/ very easy bruising.  Optho: h/o lasik surgery on Rt eye with Dr. Lucita Ferrara so seen in f/u periodically - last 08/2016 - told her eyes look good though baseline expected cataract.  07/19/15 Dr. Jola Schmidt at [facility]. Feet: Monofilament exam done 10/04/2016. Denies any no problems.  Not seen by podiatry prior.  Immunizations:  Influenza: refuses  Pneumovax-23: 05/31/2016  Right hip pain: Had protracted course including Rt hip replacement, mult speciality eval inc ortho and spine inc referral to [facility], SI joint injection, detailed imaging, etc still w/o poss diagnosis or relief. However, this spring found she had peripheral vascular disease that was causing it.   PAD: On plavix 75. Saw  Dr. Scot Dock 7/11 with Rt lower ext worsening proximal stenosis so had abd aortogram/arteriography 7/30 w/ angioplasty and stenting which resolved her Rt hip and RLExt pain entirely almost immed. H/o aortoiliac bypass graft. Recheck in 6 mos.  Inflammatory factors were checked due to persistent right hip pain - were elevated so referred pt to [facility]. Synovial fluid analysis was neg for crystals or cells.   Lab Results  Component Value Date   CRP 16.6 (H) 10/04/2016   CRP 21.3 (H) 05/10/2016   nml <5. Due to difference in normal ranges, this elevation is approx equivalent to prior CRP elevations 8-01/2016 and was mildly elevated at 2-2.3 with normal of less than 0.6 done at [facility] 2017 Lab Results  Component Value Date   ESRSEDRATE 20 10/04/2016   ESRSEDRATE 43 (H) 05/10/2016   ESRSEDRATE 32 (H) 06/09/2007  And ESR elevated at 66-70 at [facility] last year  Alk phos is now starting to be elev (normal in June, nml 3 phase bone scan in Aug at [facility]) but was sig improved to mild elevation on recheck 3 wks later.  Lab Results  Component Value Date   ALKPHOS 153 (H) 10/04/2016   ALKPHOS 139 (H) 05/31/2016   ALKPHOS 175 (H) 05/10/2016   ALKPHOS 118 10/27/2015   Tob use: Had her screening lung CT 07/04/16 - no nodules but did show centrilobular emphysema. Currently

## 2017-02-04 ENCOUNTER — Ambulatory Visit (INDEPENDENT_AMBULATORY_CARE_PROVIDER_SITE_OTHER): Payer: 59 | Admitting: Family Medicine

## 2017-02-04 ENCOUNTER — Encounter: Payer: Self-pay | Admitting: Family Medicine

## 2017-02-04 VITALS — BP 130/82 | HR 87 | Temp 98.3°F | Resp 18 | Ht 58.75 in | Wt 174.6 lb

## 2017-02-04 DIAGNOSIS — J432 Centrilobular emphysema: Secondary | ICD-10-CM

## 2017-02-04 DIAGNOSIS — E785 Hyperlipidemia, unspecified: Secondary | ICD-10-CM

## 2017-02-04 DIAGNOSIS — I10 Essential (primary) hypertension: Secondary | ICD-10-CM | POA: Diagnosis not present

## 2017-02-04 DIAGNOSIS — F17200 Nicotine dependence, unspecified, uncomplicated: Secondary | ICD-10-CM | POA: Diagnosis not present

## 2017-02-04 DIAGNOSIS — D473 Essential (hemorrhagic) thrombocythemia: Secondary | ICD-10-CM

## 2017-02-04 DIAGNOSIS — M810 Age-related osteoporosis without current pathological fracture: Secondary | ICD-10-CM | POA: Diagnosis not present

## 2017-02-04 DIAGNOSIS — D72829 Elevated white blood cell count, unspecified: Secondary | ICD-10-CM | POA: Diagnosis not present

## 2017-02-04 DIAGNOSIS — Z5181 Encounter for therapeutic drug level monitoring: Secondary | ICD-10-CM | POA: Diagnosis not present

## 2017-02-04 DIAGNOSIS — E1165 Type 2 diabetes mellitus with hyperglycemia: Secondary | ICD-10-CM

## 2017-02-04 DIAGNOSIS — D75839 Thrombocytosis, unspecified: Secondary | ICD-10-CM

## 2017-02-04 LAB — COMPREHENSIVE METABOLIC PANEL
A/G RATIO: 1.9 (ref 1.2–2.2)
ALBUMIN: 4.3 g/dL (ref 3.6–4.8)
ALK PHOS: 124 IU/L — AB (ref 39–117)
ALT: 30 IU/L (ref 0–32)
AST: 23 IU/L (ref 0–40)
BILIRUBIN TOTAL: 0.4 mg/dL (ref 0.0–1.2)
BUN / CREAT RATIO: 19 (ref 12–28)
BUN: 17 mg/dL (ref 8–27)
CHLORIDE: 96 mmol/L (ref 96–106)
CO2: 26 mmol/L (ref 20–29)
Calcium: 9.9 mg/dL (ref 8.7–10.3)
Creatinine, Ser: 0.9 mg/dL (ref 0.57–1.00)
GFR calc non Af Amer: 68 mL/min/{1.73_m2} (ref >59)
GFR, EST AFRICAN AMERICAN: 79 mL/min/{1.73_m2} (ref >59)
Globulin, Total: 2.3 g/dL (ref 1.5–4.5)
Glucose: 143 mg/dL — ABNORMAL HIGH (ref 65–99)
POTASSIUM: 3.7 mmol/L (ref 3.5–5.2)
SODIUM: 138 mmol/L (ref 134–144)
TOTAL PROTEIN: 6.6 g/dL (ref 6.0–8.5)

## 2017-02-04 LAB — LIPID PANEL
CHOLESTEROL TOTAL: 146 mg/dL (ref 100–199)
Chol/HDL Ratio: 4.1 ratio (ref 0.0–4.4)
HDL: 36 mg/dL — ABNORMAL LOW (ref >39)
LDL Calculated: 67 mg/dL (ref 0–99)
Triglycerides: 214 mg/dL — ABNORMAL HIGH (ref 0–149)
VLDL Cholesterol Cal: 43 mg/dL — ABNORMAL HIGH (ref 5–40)

## 2017-02-04 LAB — POCT CBC
GRANULOCYTE PERCENT: 73.9 % (ref 37–80)
HEMATOCRIT: 37.6 % — AB (ref 37.7–47.9)
Hemoglobin: 12.7 g/dL (ref 12.2–16.2)
Lymph, poc: 2 (ref 0.6–3.4)
MCH: 31.1 pg (ref 27–31.2)
MCHC: 33.8 g/dL (ref 31.8–35.4)
MCV: 92.2 fL (ref 80–97)
MID (CBC): 1 — AB (ref 0–0.9)
MPV: 6.7 fL (ref 0–99.8)
PLATELET COUNT, POC: 380 10*3/uL (ref 142–424)
POC GRANULOCYTE: 8.4 — AB (ref 2–6.9)
POC LYMPH %: 17.4 % (ref 10–50)
POC MID %: 8.7 %M (ref 0–12)
RBC: 4.08 M/uL (ref 4.04–5.48)
RDW, POC: 14 %
WBC: 11.4 10*3/uL — AB (ref 4.6–10.2)

## 2017-02-04 LAB — POCT GLYCOSYLATED HEMOGLOBIN (HGB A1C): Hemoglobin A1C: 6.5

## 2017-02-04 MED ORDER — VARENICLINE TARTRATE 0.5 MG X 11 & 1 MG X 42 PO MISC: ORAL | 0 refills | Status: DC

## 2017-02-04 MED ORDER — LOSARTAN POTASSIUM 25 MG PO TABS: 25.0000 mg | ORAL_TABLET | Freq: Every day | ORAL | 1 refills | Status: DC

## 2017-02-04 MED ORDER — ATENOLOL-CHLORTHALIDONE 50-25 MG PO TABS: ORAL_TABLET | ORAL | 1 refills | Status: DC

## 2017-02-04 NOTE — Patient Instructions (Addendum)
IF you received an x-ray today, you will receive an invoice from [facility]. Please contact [facility] at [phone] with questions or concerns regarding your invoice.   IF you received labwork today, you will receive an invoice from [facility]. Please contact [facility] at [phone] with questions or concerns regarding your invoice.   Our billing staff will not be able to assist you with questions regarding bills from these companies.  You will be contacted with the lab results as soon as they are available. The fastest way to get your results is to activate your My Chart account. Instructions are located on the last page of this paperwork. If you have not heard from [place] regarding the results in 2 weeks, please contact this office.      Steps to Quit Smoking Smoking tobacco can be harmful to your health and can affect almost every organ in your body. Smoking puts you, and those around you, at risk for developing many serious chronic diseases. Quitting smoking is difficult, but it is one of the best things that you can do for your health. It is never too late to quit. What are the benefits of quitting smoking? When you quit smoking, you lower your risk of developing serious diseases and conditions, such as:  Lung cancer or lung disease, such as COPD.  Heart disease.  Stroke.  Heart attack.  Infertility.  Osteoporosis and bone fractures.  Additionally, symptoms such as coughing, wheezing, and shortness of breath may get better when you quit. You may also find that you get sick less often because your body is stronger at fighting off colds and infections. If you are pregnant, quitting smoking can help to reduce your chances of having a baby of low birth weight. How do I get ready to quit? When you decide to quit smoking, create a plan to make sure that you are successful. Before you quit:  Pick a date to quit. Set a date within the next two weeks to give you  time to prepare.  Write down the reasons why you are quitting. Keep this list in places where you will see it often, such as on your bathroom mirror or in your car or wallet.  Identify the people, places, things, and activities that make you want to smoke (triggers) and avoid them. Make sure to take these actions: ? Throw away all cigarettes at home, at work, and in your car. ? Throw away smoking accessories, such as Scientist, research (medical). ? Clean your car and make sure to empty the ashtray. ? Clean your home, including curtains and carpets.  Tell your family, friends, and coworkers that you are quitting. Support from your loved ones can make quitting easier.  Talk with your health care provider about your options for quitting smoking.  Find out what treatment options are covered by your health insurance.  What strategies can I use to quit smoking? Talk with your healthcare provider about different strategies to quit smoking. Some strategies include:  Quitting smoking altogether instead of gradually lessening how much you smoke over a period of time. Research shows that quitting "cold [place]" is more successful than gradually quitting.  Attending in-person counseling to help you build problem-solving skills. You are more likely to have success in quitting if you attend several counseling sessions. Even short sessions of 10 minutes can be effective.  Finding resources and support systems that can help you to quit smoking and remain smoke-free after you quit. These resources  are most helpful when you use them often. They can include: ? Online chats with a Social worker. ? Telephone quitlines. ? Careers information officer. ? Support groups or group counseling. ? Text messaging programs. ? Mobile phone applications.  Taking medicines to help you quit smoking. (If you are pregnant or breastfeeding, talk with your health care provider first.) Some medicines contain nicotine and some do not.  Both types of medicines help with cravings, but the medicines that include nicotine help to relieve withdrawal symptoms. Your health care provider may recommend: ? Nicotine patches, gum, or lozenges. ? Nicotine inhalers or sprays. ? Non-nicotine medicine that is taken by mouth.  Talk with your health care provider about combining strategies, such as taking medicines while you are also receiving in-person counseling. Using these two strategies together makes you more likely to succeed in quitting than if you used either strategy on its own. If you are pregnant or breastfeeding, talk with your health care provider about finding counseling or other support strategies to quit smoking. Do not take medicine to help you quit smoking unless told to do so by your health care provider. What things can I do to make it easier to quit? Quitting smoking might feel overwhelming at first, but there is a lot that you can do to make it easier. Take these important actions:  Reach out to your family and friends and ask that they support and encourage you during this time. Call telephone quitlines, reach out to support groups, or work with a counselor for support.  Ask people who smoke to avoid smoking around you.  Avoid places that trigger you to smoke, such as bars, parties, or smoke-break areas at work.  Spend time around people who do not smoke.  Lessen stress in your life, because stress can be a smoking trigger for some people. To lessen stress, try: ? Exercising regularly. ? Deep-breathing exercises. ? Yoga. ? Meditating. ? Performing a body scan. This involves closing your eyes, scanning your body from head to toe, and noticing which parts of your body are particularly tense. Purposefully relax the muscles in those areas.  Download or purchase mobile phone or tablet apps (applications) that can help you stick to your quit plan by providing reminders, tips, and encouragement. There are many free apps,  such as QuitGuide from the [employer] Office manager for Disease Control and Prevention). You can find other support for quitting smoking (smoking cessation) through smokefree.gov and other websites.  How will I feel when I quit smoking? Within the first 24 hours of quitting smoking, you may start to feel some withdrawal symptoms. These symptoms are usually most noticeable 2-3 days after quitting, but they usually do not last beyond 2-3 weeks. Changes or symptoms that you might experience include:  Mood swings.  Restlessness, anxiety, or irritation.  Difficulty concentrating.  Dizziness.  Strong cravings for sugary foods in addition to nicotine.  Mild weight gain.  Constipation.  Nausea.  Coughing or a sore throat.  Changes in how your medicines work in your body.  A depressed mood.  Difficulty sleeping (insomnia).  After the first 2-3 weeks of quitting, you may start to notice more positive results, such as:  Improved sense of smell and taste.  Decreased coughing and sore throat.  Slower heart rate.  Lower blood pressure.  Clearer skin.  The ability to breathe more easily.  Fewer sick days.  Quitting smoking is very challenging for most people. Do not get discouraged if you are not successful  the first time. Some people need to make many attempts to quit before they achieve long-term success. Do your best to stick to your quit plan, and talk with your health care provider if you have any questions or concerns. This information is not intended to replace advice given to you by your health care provider. Make sure you discuss any questions you have with your health care provider. Document Released: 04/17/2001 Document Revised: 12/20/2015 Document Reviewed: 09/07/2014 Elsevier Interactive Patient Education  2017 [place] with Quitting Smoking Quitting smoking is a physical and mental challenge. You will face cravings, withdrawal symptoms, and temptation. Before  quitting, work with your health care provider to make a plan that can help you cope. Preparation can help you quit and keep you from giving in. How can I cope with cravings? Cravings usually last for 5-10 minutes. If you get through it, the craving will pass. Consider taking the following actions to help you cope with cravings:  Keep your mouth busy: ? Chew sugar-free gum. ? Suck on hard candies or a straw. ? Brush your teeth.  Keep your hands and body busy: ? Immediately change to a different activity when you feel a craving. ? Squeeze or play with a ball. ? Do an activity or a hobby, like making bead jewelry, practicing needlepoint, or working with wood. ? Mix up your normal routine. ? Take a short exercise break. Go for a quick walk or run up and down stairs. ? Spend time in public places where smoking is not allowed.  Focus on doing something kind or helpful for someone else.  Call a friend or family member to talk during a craving.  Join a support group.  Call a quit line, such as 1-800-QUIT-NOW.  Talk with your health care provider about medicines that might help you cope with cravings and make quitting easier for you.  How can I deal with withdrawal symptoms? Your body may experience negative effects as it tries to get used to not having nicotine in the system. These effects are called withdrawal symptoms. They may include:  Feeling hungrier than normal.  Trouble concentrating.  Irritability.  Trouble sleeping.  Feeling depressed.  Restlessness and agitation.  Craving a cigarette.  To manage withdrawal symptoms:  Avoid places, people, and activities that trigger your cravings.  Remember why you want to quit.  Get plenty of sleep.  Avoid coffee and other caffeinated drinks. These may worsen some of your symptoms.  How can I handle social situations? Social situations can be difficult when you are quitting smoking, especially in the first few weeks. To  manage this, you can:  Avoid parties, bars, and other social situations where people might be smoking.  Avoid alcohol.  Leave right away if you have the urge to smoke.  Explain to your family and friends that you are quitting smoking. Ask for understanding and support.  Plan activities with friends or family where smoking is not an option.  What are some ways I can cope with stress? Wanting to smoke may cause stress, and stress can make you want to smoke. Find ways to manage your stress. Relaxation techniques can help. For example:  Breathe slowly and deeply, in through your nose and out through your mouth.  Listen to soothing, relaxing music.  Talk with a family member or friend about your stress.  Light a candle.  Soak in a bath or take a shower.  Think about a peaceful place.  What are  some ways I can prevent weight gain? Be aware that many people gain weight after they quit smoking. However, not everyone does. To keep from gaining weight, have a plan in place before you quit and stick to the plan after you quit. Your plan should include:  Having healthy snacks. When you have a craving, it may help to: ? Eat plain popcorn, crunchy carrots, celery, or other cut vegetables. ? Chew sugar-free gum.  Changing how you eat: ? Eat small portion sizes at meals. ? Eat 4-6 small meals throughout the day instead of 1-2 large meals a day. ? Be mindful when you eat. Do not watch television or do other things that might distract you as you eat.  Exercising regularly: ? Make time to exercise each day. If you do not have time for a long workout, do short bouts of exercise for 5-10 minutes several times a day. ? Do some form of strengthening exercise, like weight lifting, and some form of aerobic exercise, like running or swimming.  Drinking plenty of water or other low-calorie or no-calorie drinks. Drink 6-8 glasses of water daily, or as much as instructed by your health care  provider.  Summary  Quitting smoking is a physical and mental challenge. You will face cravings, withdrawal symptoms, and temptation to smoke again. Preparation can help you as you go through these challenges.  You can cope with cravings by keeping your mouth busy (such as by chewing gum), keeping your body and hands busy, and making calls to family, friends, or a helpline for people who want to quit smoking.  You can cope with withdrawal symptoms by avoiding places where people smoke, avoiding drinks with caffeine, and getting plenty of rest.  Ask your health care provider about the different ways to prevent weight gain, avoid stress, and handle social situations. This information is not intended to replace advice given to you by your health care provider. Make sure you discuss any questions you have with your health care provider. Document Released: 04/20/2016 Document Revised: 04/20/2016 Document Reviewed: 04/20/2016 Elsevier Interactive Patient Education  [employer].

## 2017-02-06 MED ORDER — ATORVASTATIN CALCIUM 80 MG PO TABS: ORAL_TABLET | ORAL | 1 refills | Status: DC

## 2017-03-01 ENCOUNTER — Other Ambulatory Visit: Payer: Self-pay | Admitting: Family Medicine

## 2017-03-04 ENCOUNTER — Encounter: Payer: Self-pay | Admitting: Family Medicine

## 2017-03-08 ENCOUNTER — Telehealth: Payer: Self-pay | Admitting: Family Medicine

## 2017-03-08 MED ORDER — VARENICLINE TARTRATE 1 MG PO TABS: 1.0000 mg | ORAL_TABLET | Freq: Two times a day (BID) | ORAL | 4 refills | Status: DC

## 2017-03-08 NOTE — Telephone Encounter (Signed)
Pharmacy states that they have been sending a request for Chantix for a week now and pt need to be on this tomorrow

## 2017-03-08 NOTE — Telephone Encounter (Signed)
Sent in and pt alerted by [employer]

## 2017-04-12 ENCOUNTER — Other Ambulatory Visit: Payer: Self-pay | Admitting: Family Medicine

## 2017-05-13 ENCOUNTER — Other Ambulatory Visit: Payer: Self-pay | Admitting: Family Medicine

## 2017-05-19 ENCOUNTER — Other Ambulatory Visit: Payer: Self-pay | Admitting: Family Medicine

## 2017-06-10 ENCOUNTER — Ambulatory Visit: Payer: 59 | Admitting: Physician Assistant

## 2017-06-13 ENCOUNTER — Other Ambulatory Visit: Payer: Self-pay | Admitting: Family Medicine

## 2017-07-05 ENCOUNTER — Ambulatory Visit (INDEPENDENT_AMBULATORY_CARE_PROVIDER_SITE_OTHER)
Admission: RE | Admit: 2017-07-05 | Discharge: 2017-07-05 | Disposition: A | Discharge status: Home / Self Care | Payer: 59 | Source: Ambulatory Visit | Attending: Acute Care | Admitting: Acute Care

## 2017-07-05 DIAGNOSIS — F17210 Nicotine dependence, cigarettes, uncomplicated: Secondary | ICD-10-CM | POA: Diagnosis not present

## 2017-07-08 ENCOUNTER — Other Ambulatory Visit: Payer: Self-pay | Admitting: Acute Care

## 2017-07-08 DIAGNOSIS — Z87891 Personal history of nicotine dependence: Secondary | ICD-10-CM

## 2017-07-08 DIAGNOSIS — Z122 Encounter for screening for malignant neoplasm of respiratory organs: Secondary | ICD-10-CM

## 2017-07-10 ENCOUNTER — Ambulatory Visit (HOSPITAL_COMMUNITY)
Admission: RE | Admit: 2017-07-10 | Discharge: 2017-07-10 | Disposition: A | Discharge status: Home / Self Care | Payer: 59 | Source: Ambulatory Visit | Attending: Vascular Surgery | Admitting: Vascular Surgery

## 2017-07-10 ENCOUNTER — Ambulatory Visit: Payer: 59 | Admitting: Vascular Surgery

## 2017-07-10 ENCOUNTER — Ambulatory Visit (INDEPENDENT_AMBULATORY_CARE_PROVIDER_SITE_OTHER)
Admission: RE | Admit: 2017-07-10 | Discharge: 2017-07-10 | Disposition: A | Discharge status: Home / Self Care | Payer: 59 | Source: Ambulatory Visit | Attending: Vascular Surgery | Admitting: Vascular Surgery

## 2017-07-10 DIAGNOSIS — Z9889 Other specified postprocedural states: Secondary | ICD-10-CM | POA: Diagnosis not present

## 2017-07-10 DIAGNOSIS — I771 Stricture of artery: Secondary | ICD-10-CM | POA: Diagnosis not present

## 2017-07-10 DIAGNOSIS — I7409 Other arterial embolism and thrombosis of abdominal aorta: Secondary | ICD-10-CM

## 2017-07-10 DIAGNOSIS — R9439 Abnormal result of other cardiovascular function study: Secondary | ICD-10-CM | POA: Diagnosis not present

## 2017-07-10 DIAGNOSIS — I998 Other disorder of circulatory system: Secondary | ICD-10-CM | POA: Diagnosis not present

## 2017-07-11 ENCOUNTER — Other Ambulatory Visit: Payer: Self-pay | Admitting: Family Medicine

## 2017-07-11 NOTE — Telephone Encounter (Signed)
LOV 02/04/17    06/10/17 Cancelled appt  Dr. Brigitte Pulse  [facility].   [place], [place]  Lasix and Metformin

## 2017-07-31 ENCOUNTER — Ambulatory Visit: Payer: 59 | Admitting: Vascular Surgery

## 2017-07-31 ENCOUNTER — Encounter: Payer: Self-pay | Admitting: Vascular Surgery

## 2017-07-31 ENCOUNTER — Other Ambulatory Visit: Payer: Self-pay

## 2017-07-31 VITALS — BP 138/62 | HR 61 | Temp 97.4°F | Resp 16 | Ht <= 58 in | Wt 184.0 lb

## 2017-07-31 DIAGNOSIS — I7409 Other arterial embolism and thrombosis of abdominal aorta: Secondary | ICD-10-CM

## 2017-07-31 NOTE — Progress Notes (Signed)
Patient name: Diane Giles MRN: [id number] DOB: 1956/01/16 Sex: female  REASON FOR VISIT:   Follow-up of aortoiliac occlusive disease.  HPI:   Diane Giles is a pleasant 62 y.o. female who underwent an aortobiiliac bypass in 2000.  She was noted to have a stenosis in the right limb of her graft.  On 12/03/2016 she underwent angioplasty and stenting of an anastomotic stenosis between the right limb of her aortoiliac graft and the right common iliac artery.  I used a 7 mm x 39 mm VBX Gore covered stent.  When I saw her last on 01/02/2017, she was doing well.  She had a Doppler study on 12/18/2016 which showed triphasic Doppler signals in the dorsalis pedis and posterior tibial positions with ABIs 100% bilaterally.  She comes in for a 74-month follow-up visit.  Since I saw her last, she quit smoking in October 2018.  She has been on some weight since this.  She took Chantix which helped her with tobacco cessation.  She denies any claudication, rest pain, or nonhealing ulcers.  She has not been walking as much recently because of the weather.  She denies any history of stroke, TIAs, expressive or receptive aphasia or amaurosis fugax.  Past Medical History:  Diagnosis Date  . Arthritis   . Diabetes mellitus without complication (HCC)    per pt she is pre- diabetic  . Diverticulitis   . Hypertension   . Osteoporosis   . Sleep apnea     Family History  Problem Relation Age of Onset  . Cancer Father 43       Dec age 18 with pancreatic Ca  . Migraines Mother   . Thyroid disease Mother        hypothyroid    SOCIAL HISTORY:  Social History   Tobacco Use  . Smoking status: Former Smoker    Years: 40.00    Types: Cigarettes    Last attempt to quit: 02/23/2017    Years since quitting: 0.4  . Smokeless tobacco: Never Used  Substance Use Topics  . Alcohol use: Yes    Alcohol/week: 0.0 oz    Comment: very rare--1/month    Allergies  Allergen Reactions  . Codeine Itching     Current Outpatient Medications  Medication Sig Dispense Refill  . aspirin 81 MG chewable tablet Chew 81 mg by mouth daily.    Marland Kitchen atenolol-chlorthalidone (TENORETIC) 50-25 MG tablet TAKE 1 TABLET EACH DAY. 90 tablet 1  . atorvastatin (LIPITOR) 80 MG tablet TAKE 1 TABLET AT 6PM. 90 tablet 1  . blood glucose meter kit and supplies Dispense based on patient and insurance preference. Use as directed once a day. (DX: R73.9) 1 each 0  . furosemide (LASIX) 20 MG tablet TAKE 1 TABLET IN THE MORNING. 30 tablet 0  . glucose blood (GLUCOSE METER TEST) test strip Use as instructed once a day. (DX:R73.9) 50 each 2  . Lancets MISC Use as directed once a day. (DX: R73.9) 100 each 2  . losartan (COZAAR) 25 MG tablet Take 1 tablet (25 mg total) by mouth daily. 90 tablet 1  . metFORMIN (GLUCOPHAGE) 1000 MG tablet Take 1 tablet (1,000 mg total) by mouth 2 (two) times daily with a meal. 180 tablet 3  . metFORMIN (GLUCOPHAGE) 500 MG tablet TAKE 2 TABLETS TWICE DAILY WITH MEALS. 120 tablet 0  . Omega-3 Fatty Acids (OMEGA 3 PO) Take 1 capsule by mouth 2 (two) times daily.     No current  facility-administered medications for this visit.     REVIEW OF SYSTEMS:  [X]  denotes positive finding, [ ]  denotes negative finding Cardiac  Comments:  Chest pain or chest pressure:    Shortness of breath upon exertion:    Short of breath when lying flat:    Irregular heart rhythm:        Vascular    Pain in calf, thigh, or hip brought on by ambulation:    Pain in feet at night that wakes you up from your sleep:     Blood clot in your veins:    Leg swelling:         Pulmonary    Oxygen at home:    Productive cough:     Wheezing:         Neurologic    Sudden weakness in arms or legs:     Sudden numbness in arms or legs:     Sudden onset of difficulty speaking or slurred speech:    Temporary loss of vision in one eye:     Problems with dizziness:         Gastrointestinal    Blood in stool:     Vomited blood:          Genitourinary    Burning when urinating:     Blood in urine:        Psychiatric    Major depression:         Hematologic    Bleeding problems:    Problems with blood clotting too easily:        Skin    Rashes or ulcers:        Constitutional    Fever or chills:     PHYSICAL EXAM:   Vitals:   07/31/17 0938  BP: 138/62  Pulse: 61  Resp: 16  Temp: (!) 97.4 F (36.3 C)  TempSrc: Oral  SpO2: 99%  Weight: 184 lb (83.5 kg)  Height: 4\' 10"  (1.473 m)    GENERAL: The patient is a well-nourished female, in no acute distress. The vital signs are documented above. CARDIAC: There is a regular rate and rhythm.  VASCULAR: She has bilateral carotid bruits. She has palpable femoral pulses although difficult to palpate because of her weight. I cannot palpate pedal pulses but both feet are warm and well-perfused. PULMONARY: There is good air exchange bilaterally without wheezing or rales. ABDOMEN: Soft and non-tender with normal pitched bowel sounds.  MUSCULOSKELETAL: There are no major deformities or cyanosis. NEUROLOGIC: No focal weakness or paresthesias are detected. SKIN: There are no ulcers or rashes noted. PSYCHIATRIC: The patient has a normal affect.  DATA:    DUPLEX ABDOMINAL AORTA AND ILIACS: I have independently interpreted her duplex that was done on 07/10/2017.  There were some mildly elevated velocities in the proximal limbs of her aortobiiliac graft.  On the left peak systolic velocity was 191 cm/s.  On the right peak systolic velocity was 247 cm/s.  ARTERIAL DOPPLER STUDY: I have independently interpreted her arterial Doppler study that was done on 07/10/2017.  The patient had triphasic Doppler signals bilaterally with an ABI of 96% on the right and 100% on the left.  MEDICAL ISSUES:   AORTOILIAC OCCLUSIVE DISEASE: This patient is undergone previous aortoiliac bypass grafting and most recently placement of a covered stent in a stenosis at the anastomosis of the  right limb of her aortoiliac graft to her common iliac artery.  This is widely patent.  She has some  mildly elevated velocities in the proximal limbs of her graft which may be related to the change in size of the graft.  Regardless she has triphasic Doppler signals and normal ABIs in both feet.  We will continue to follow her graft closely and I schedule ABIs and a duplex in 6 months.  I will see her back at that time.  I congratulated her on her successful tobacco cessation.  I have encouraged her to walk as much as possible.  We also discussed the importance of nutrition.  CAROTID BRUITS: She does have carotid bruits.  I recommended that we obtain a carotid duplex scan however she was unable to stay today.  She would prefer to have this done at her next visit given that she is asymptomatic I think this is reasonable.  She is on aspirin and is on a statin.  Waverly Ferrari [facility] [phone]

## 2017-08-02 ENCOUNTER — Other Ambulatory Visit: Payer: Self-pay

## 2017-08-02 DIAGNOSIS — I723 Aneurysm of iliac artery: Secondary | ICD-10-CM

## 2017-08-02 DIAGNOSIS — I739 Peripheral vascular disease, unspecified: Secondary | ICD-10-CM

## 2017-08-02 DIAGNOSIS — I6529 Occlusion and stenosis of unspecified carotid artery: Secondary | ICD-10-CM

## 2017-08-02 DIAGNOSIS — I7409 Other arterial embolism and thrombosis of abdominal aorta: Secondary | ICD-10-CM

## 2017-08-12 ENCOUNTER — Other Ambulatory Visit: Payer: Self-pay | Admitting: Family Medicine

## 2017-08-12 NOTE — Telephone Encounter (Signed)
Patient called, left detailed VM to call and schedule a follow up appointment for this month to evaluate diabetes, as noted in the last OV note to return in Feb, 2019, which was a cancelled appointment by patient.

## 2017-08-13 NOTE — Telephone Encounter (Signed)
Patient called, left VM to return call to the office to schedule an appointment before medications can be refilled.

## 2017-08-14 ENCOUNTER — Encounter: Payer: Self-pay | Admitting: Physician Assistant

## 2017-08-14 ENCOUNTER — Other Ambulatory Visit: Payer: Self-pay | Admitting: Family Medicine

## 2017-08-14 NOTE — Telephone Encounter (Signed)
Furosemide refill Last OV: 02/04/17 Last Refill:07/12/17 #30 tabs Pharmacy:[facility]  PCP: Delman Cheadle MD 'last CMET 02/04/17  Atenolol-chlorthalidone refill Last OV: 02/04/17 Last Refill:02/04/17 #90 tabs 1 RF [facility]  Metformin refill Last OV: 02/04/17 Last Refill:07/12/17 Pharmacy:[facility]  losartan refill Last OV: 02/04/17 Last Refill:02/04/17 Pharmacy:[facility]  atorvastatin refill Last OV: 02/04/17 Last Refill:02/06/17 Pharmacy:[facility]

## 2017-08-16 ENCOUNTER — Other Ambulatory Visit: Payer: Self-pay

## 2017-08-16 ENCOUNTER — Ambulatory Visit: Payer: 59 | Admitting: Family Medicine

## 2017-08-16 ENCOUNTER — Encounter: Payer: Self-pay | Admitting: Family Medicine

## 2017-08-16 VITALS — BP 122/60 | HR 74 | Temp 97.9°F | Resp 18 | Ht <= 58 in | Wt 186.4 lb

## 2017-08-16 DIAGNOSIS — M818 Other osteoporosis without current pathological fracture: Secondary | ICD-10-CM

## 2017-08-16 DIAGNOSIS — D72829 Elevated white blood cell count, unspecified: Secondary | ICD-10-CM | POA: Diagnosis not present

## 2017-08-16 DIAGNOSIS — E1165 Type 2 diabetes mellitus with hyperglycemia: Secondary | ICD-10-CM

## 2017-08-16 LAB — CBC WITH DIFFERENTIAL/PLATELET
BASOS ABS: 0 10*3/uL (ref 0.0–0.2)
BASOS: 0 %
EOS (ABSOLUTE): 0.3 10*3/uL (ref 0.0–0.4)
Eos: 4 %
HEMOGLOBIN: 11.5 g/dL (ref 11.1–15.9)
Hematocrit: 34.4 % (ref 34.0–46.6)
IMMATURE GRANS (ABS): 0 10*3/uL (ref 0.0–0.1)
Immature Granulocytes: 0 %
LYMPHS ABS: 2.2 10*3/uL (ref 0.7–3.1)
Lymphs: 27 %
MCH: 30.7 pg (ref 26.6–33.0)
MCHC: 33.4 g/dL (ref 31.5–35.7)
MCV: 92 fL (ref 79–97)
MONOCYTES: 5 %
Monocytes Absolute: 0.4 10*3/uL (ref 0.1–0.9)
NEUTROS ABS: 5.2 10*3/uL (ref 1.4–7.0)
Neutrophils: 64 %
Platelets: 325 10*3/uL (ref 150–379)
RBC: 3.74 x10E6/uL — ABNORMAL LOW (ref 3.77–5.28)
RDW: 13.2 % (ref 12.3–15.4)
WBC: 8.2 10*3/uL (ref 3.4–10.8)

## 2017-08-16 LAB — COMPREHENSIVE METABOLIC PANEL
A/G RATIO: 1.7 (ref 1.2–2.2)
ALBUMIN: 4 g/dL (ref 3.6–4.8)
ALT: 18 IU/L (ref 0–32)
AST: 15 IU/L (ref 0–40)
Alkaline Phosphatase: 167 IU/L — ABNORMAL HIGH (ref 39–117)
BILIRUBIN TOTAL: 0.4 mg/dL (ref 0.0–1.2)
BUN / CREAT RATIO: 24 (ref 12–28)
BUN: 22 mg/dL (ref 8–27)
CALCIUM: 9.5 mg/dL (ref 8.7–10.3)
CHLORIDE: 98 mmol/L (ref 96–106)
CO2: 24 mmol/L (ref 20–29)
Creatinine, Ser: 0.93 mg/dL (ref 0.57–1.00)
GFR, EST AFRICAN AMERICAN: 75 mL/min/{1.73_m2} (ref >59)
GFR, EST NON AFRICAN AMERICAN: 65 mL/min/{1.73_m2} (ref >59)
Globulin, Total: 2.4 g/dL (ref 1.5–4.5)
Glucose: 111 mg/dL — ABNORMAL HIGH (ref 65–99)
POTASSIUM: 4.1 mmol/L (ref 3.5–5.2)
Sodium: 141 mmol/L (ref 134–144)
TOTAL PROTEIN: 6.4 g/dL (ref 6.0–8.5)

## 2017-08-16 LAB — POCT GLYCOSYLATED HEMOGLOBIN (HGB A1C): Hemoglobin A1C: 6.2

## 2017-08-16 MED ORDER — ATENOLOL-CHLORTHALIDONE 50-25 MG PO TABS: ORAL_TABLET | ORAL | 3 refills | Status: DC

## 2017-08-16 MED ORDER — FUROSEMIDE 20 MG PO TABS: 20.0000 mg | ORAL_TABLET | Freq: Every morning | ORAL | 1 refills | Status: DC

## 2017-08-16 MED ORDER — ATORVASTATIN CALCIUM 80 MG PO TABS: 80.0000 mg | ORAL_TABLET | Freq: Every day | ORAL | 3 refills | Status: DC

## 2017-08-16 MED ORDER — LOSARTAN POTASSIUM 25 MG PO TABS: ORAL_TABLET | ORAL | 3 refills | Status: DC

## 2017-08-16 MED ORDER — METFORMIN HCL 500 MG PO TABS: ORAL_TABLET | ORAL | 3 refills | Status: DC

## 2017-08-16 NOTE — Patient Instructions (Addendum)
   IF you received an x-ray today, you will receive an invoice from [facility]. Please contact [facility] at [phone] with questions or concerns regarding your invoice.   IF you received labwork today, you will receive an invoice from [facility]. Please contact [facility] at [phone] with questions or concerns regarding your invoice.   Our billing staff will not be able to assist you with questions regarding bills from these companies.  You will be contacted with the lab results as soon as they are available. The fastest way to get your results is to activate your My Chart account. Instructions are located on the last page of this paperwork. If you have not heard from us regarding the results in 2 weeks, please contact this office.     Exercising to Lose Weight Exercising can help you to lose weight. In order to lose weight through exercise, you need to do vigorous-intensity exercise. You can tell that you are exercising with vigorous intensity if you are breathing very hard and fast and cannot hold a conversation while exercising. Moderate-intensity exercise helps to maintain your current weight. You can tell that you are exercising at a moderate level if you have a higher heart rate and faster breathing, but you are still able to hold a conversation. How often should I exercise? Choose an activity that you enjoy and set realistic goals. Your health care provider can help you to make an activity plan that works for you. Exercise regularly as directed by your health care provider. This may include:  Doing resistance training twice each week, such as: ? Push-ups. ? Sit-ups. ? Lifting weights. ? Using resistance bands.  Doing a given intensity of exercise for a given amount of time. Choose from these options: ? 150 minutes of moderate-intensity exercise every week. ? 75 minutes of vigorous-intensity exercise every week. ? A mix of moderate-intensity and  vigorous-intensity exercise every week.  Children, pregnant women, people who are out of shape, people who are overweight, and older adults may need to consult a health care provider for individual recommendations. If you have any sort of medical condition, be sure to consult your health care provider before starting a new exercise program. What are some activities that can help me to lose weight?  Walking at a rate of at least 4.5 miles an hour.  Jogging or running at a rate of 5 miles per hour.  Biking at a rate of at least 10 miles per hour.  Lap swimming.  Roller-skating or in-line skating.  Cross-country skiing.  Vigorous competitive sports, such as football, basketball, and soccer.  Jumping rope.  Aerobic dancing. How can I be more active in my day-to-day activities?  Use the stairs instead of the elevator.  Take a walk during your lunch break.  If you drive, park your car farther away from work or school.  If you take public transportation, get off one stop early and walk the rest of the way.  Make all of your phone calls while standing up and walking around.  Get up, stretch, and walk around every 30 minutes throughout the day. What guidelines should I follow while exercising?  Do not exercise so much that you hurt yourself, feel dizzy, or get very short of breath.  Consult your health care provider prior to starting a new exercise program.  Wear comfortable clothes and shoes with good support.  Drink plenty of water while you exercise to prevent dehydration or heat stroke. Body water is   lost during exercise and must be replaced.  Work out until you breathe faster and your heart beats faster. This information is not intended to replace advice given to you by your health care provider. Make sure you discuss any questions you have with your health care provider. Document Released: 05/26/2010 Document Revised: 09/29/2015 Document Reviewed: 09/24/2013 Elsevier  Interactive Patient Education  2018 [employer].  [employer] Loss Diet    Restaurants  Eating out at restaurants has become a way of life for most of [place].  On an average basis, Americans eat more than 25% of their meals away from home.  When eating a meal at a restaurant, it is important to plan and think ahead about what healthy food choices are available. In addition, it is important to think that every meal out is a special occasion and to practice portion control.  Most restaurants serve portions that are 2-3 times bigger than what is considered normal.  There are several ways to control these portion sizes:  Share the meal with another person, have half of the meal bagged "to go" before eating, or eat half and leave the rest behind.  If the bread basket is your weakness, ask that it not be served.  Portion sizes also pertain to drinks. Water, diet soda, and unsweetened iced tea are the best calorie free beverages available. Multiple refills of regular soda and alcoholic beverages greatly increase total calorie intake.   The following items offer smart, low calore choice when eating at a restaurant as well as the appropriate serving sizes:  Fast Food: Fried chicken sandwich with lettuce and tomato, mayonnaise, cheese and [place] fries should be avoided.  Many salads at fast food restaurants have more calories and fat than a hamburger.  Make sure salad has grilled meat only with no cheese  Or nuts.  Fat free or light dressings should always be used.  [place] Food: 1 cup egg drop soup or 1 cup of Chinese vegetables with shrimp or tofin.  Avoid all fried food, including fried rice.  One half to 1 cup steamed white rice is also acceptable.   [place] Food: 1 small bean burrito or 2 chicken fajitas without cheese, sour cream and guacamole.  Fat free or light sour cream is allowed.  [place] Food: Spaghetti with [facility] (1 cup spaghetti, 1/2 cup sauce).  Cram sauces (alfredo) and fried foods (chicken,  eggplant, veal parmesan) should be avoided.  [place] Food: Sushi and teriyaki fish or chicken are healthy options served with steamed vegetables.  Brunch Buffet: 1 cup fruit salad, 1 cup oatmeal, 2 pancakes with light syrup, 1/2 cup scrambled eggs, toast with jelly.  Biscuits and gravy, bacon and sausage should be avoided.  [place] bacon is acceptable.  Commitment- Commitment is a critical part of any successful project and is the difference between failure and success.  It involves focusing on your goal seriously and using will power to get through the rough times.  This allows you to discover inner strengths and develop a confident and an in control new you.   Dieting is not an easy task and there are bound to be rough times.  However, if your commitment to yourself and your weight loss is strong, you will be successful in achieving your goal.  Don't break your promise to yourself.    Diet Modification:  The main components of a healthy diet include 3 small meals with 3 healthy snakes throughout each day.  Your should not go more  than 4 hours without eating a small snack or meal.  This helps avoid "starvation" and decreased the urge to choose unhealthy foods. The following guidelines should be followed through the day:  Focus on portion size and control   It is important to read nutrition labels so that you are aware of the appropriate serving size  For those foods that do not have a nutrition label, you can generally use your had as a guide for the appropriate servings sizes.  Fist 1 cup or medium whole fruit  Thumb (tip to base) 1 ounce of meat or chees  Thumb tip (tip to 1st joint) 1 tablespoon  Fingertip (tip to 1st joint) 1 teaspoon  Cupped hand 1-2 ounces of nuts, pretzels, popcorn  Palm (minus fingers) 3 ounces of meat, fish or poultry    Eat breakfast daily to increase metabolism and prevent loss of control later in the day.  Research has repeatedly shown that patients who eat  breakfast daily lose more weight and keep the weight off more effectively than patients who skip breakfast.  One serving of high fiber cereal (> 3 grams of fiber) with 1 cup of skim milk.  One serving of old fashioned oatmeal ( not instant) made with skim milk.  May add 1/2 cup chopped fruit an sugar substitute for added flavor.  Two egg omelet made with egg beaters. May add 1 tablespoon reduced fat cheese and unlimited vegetables.  One slice whole grain toast with 1 teaspoon Smart Beat margarine.  Avoid fruit juice since it has too many sugars and too little fiber. Coffee and tea fine with sugar substitute and skim or fat free dairy creamer.  Mid morning snack-choosing one of the following will help keep the craving under control.  One piece of fruit-apple, pear, banana, 1 cup of grapes  One half cup 2% cottage cheese  One container of fat free yogurt  One low fat mozzarella cheese stick  1 teaspoon natural peanut butter on celery sticks  1-2 cups air popped popcorn sprayed no more than twice with fat free margarine spray (May only have one per day)    Lunch  Start with an all vegetable salad ( no cheese or croutons with 1 teaspoon fat free or light dressing salad dressing.  Many patients find it convenient to eat a prepared meal such as Healthy [place], [employer], [employer], [employer].  It is important to alternate these prepared meals with homemade meals due to their high salt content.  See dinner options for homemade meal ideas.  May add 1/2 cup rice to meal if prepared meal is not adequate  Piece of fruit.  Mid Afternoon Snack-Same as mid morning snack  Dinner  May pick one serving from each of the following categories (derived from [employer] best selling book, :"Body for Live").  Start with an all vegetable salad (no cheese or croutons) with 1 teaspoon of fat free or light salad dressing or 1 cup low fat broth based soup Good Proteins (Palm-size portion  4oz) Good Carbohydrates (Fist-sized portion) Good Vegetables  Baked or grilled chicken breast Baked Potato Broccoli  [place] Breast Sweet Potato Asparagus  Lean ground [place] Yam Lettuce  Fish (baked, broiled, grilled, blackened allowed) [employer] ( 1/2-1 cup) Carrots  Tuna (Fresh or canned in water) Wild rice (1/2-1 cup) Cauliflower  Crab Pasta ( 1 cup whole grain) Green Beans  Shrimp Oatmeal Green Peppers  Top round steak Beans(red, pinto, black) Mushrooms   Top sirloin  steak Corn [place]  Lean ground beef Strawberries Tomato  Low-fat cottage cheese Whole wheat bread (1 slice regular 2 slice light) Artichoke    Cabbage    Zucchini    Cucumber    Onion    Prepare these foods using low-fat techniques such as grilling, baking, broiling, blackening meats and steaming vegetables.  Avoid canned vegetables and fruits to decrease sodium intake.  Fresh and froze produce offer the most nutritional value  Evening Snack or "Dessert"  1/2 cup skim milk with 1 package of No sugar added hot cocoa mix  1 serving of Weight Watchers frozen desserts, Fudgesicle bar, Skinny cow ice cream treats  1 cup Sugar Free Jell-O or instant pudding topped with 1 teaspoon fat free whipped topping    1-2 cups air popped popcorn sprayed no more than twice with Fat Free Margarine Spray (may only have once per day)  Water  Eight glasses of water daily (lemon, sugar substitute, and crystal light accepted)  All regular sodas and sweetened tea are NOT allowed.  This results in taking in too many empty calories.  Two diet sodas per day are allowed as are an unlimited amount of decaffeinated coffee and tea as long as sugar substitute (Splenda, Sweet and Low, Equal) only are used for sweetening.  Water naturally suppresses the appetite and helps the body metabolize stored fat  Drinking enough water is the best treatment for fluid retention (unless you have been diagnosed with kidney deficiency).  When the body  gets less water, it senses it as a threat and holds on to the water.  It is stored in places such as the feet, legs and hands.  By drinking plenty of water, the problem of water, the problem of water retention is solved and weight loss occurs.  The above diet modifications are based on the following principles:  25-35 grams of fiber daily (found in vegetables, whole grain, oats, beans and fruits)  Choose healthy carbohydrates that are rich in fiber, vitamins, and minerals (wheat bread vs white bread, wheat pasta vs regular pasta)  Healthy fats: Choose canola or olive oil if you must use oil to cook with.  Pam cooking spray is a good substitute.  Smart Choice margarine is a healthy alternative to other margarine and butter options.  Baked goods and fried foods should be avoided to decrease calorie intake harmful trans fatty acids and saturated fat absorb in the body.  Healthy Proteins: Americans consume far too much red meat (high amount of saturated fat). Healthier sources of protein are baked or broiled fish,poultry (without skin), beans, low-fat cottage cheese, egg whites and egg substitutes.  If you choose red meat, pick lean cuts such as top round, to sirloin or round,  The following will be implemented to help assist you in achieving successful weight loss on a weekly basis:  Food Diary: Keeping a record of all food you have eaten on a daily basis helps to make you aware of your food choices and helps determine the amount you have eaten.  It also serves as a great reference to look back to see what you did well on during the weeks you were most successful.  Weekly Meal Plan: Plan your meals and go grocery shopping on a weekly basis.  Those patients that plan their meals are far more successful on reaching their goals than other patients.  Without a plan, it is easy to fall back into old eating habits.  Weekly weigh in: Patients who  report weekly for weight checks do better than those that try  to diet without help.  Knowing your weight will be checked weekly helps keep you on track as well as staying motivated by the encouragement received on a weekly basis.    Exercise  You will need to begin exercising at least 5 days a week.  If you are just beginning an exercise program, start slowly at 15-20 minutes and gradually work up to a longer period of time.  If you are tolerating the exercise well you can increase by 5 minutes each day.  You may use any form of exercise you wish (walking, aerobics, treadmill, stair stepper, elliptical machine).  If you can walk two miles in 30-40 minutes,  You are at a good pace for a successful start.   Do not over do it!!  Make sure you can carry on a conversation with someone or able to whistle while you exercise.  Once you adjust your pace and are breathing normally, step up your pace as you can tolerate it.   The following facilities in [place] area offer gym memberships:  [employer] gym: Only $9.00 a month  [employer]: [phone]  [facility]: [phone] ($29.00 per month)  [facility] [phone]  Medication  When medically appropriate, you will be prescribed a medication to control your appetite.  This will allow you to help follow the dietary recommendations.  Only one prescription for a 30 day supply can be given per visit, so do not misplace your prescription or medication.  It is important to remember that the purpose of the medication is to help bring your appetite under control and will be used for a short time.  You need to change your daily habits if you are to be successful in this program. If side effects such as severe headaches, nausea, and vomiting or skin rash develop, discontinue your medication and call your healthcare provider.

## 2017-08-16 NOTE — Progress Notes (Signed)
Subjective:  By signing my name below, I, [name], attest that this documentation has been prepared under the direction and in the presence of Norberto Sorenson, MD Electronically Signed: Charline Bills, ED Scribe 08/16/2017 at 10:35 AM.   Patient ID: Diane Giles, female    DOB: 10-Mar-1956, 62 y.o.   MRN: [id number]  Chief Complaint  Patient presents with  . Diabetes  . Follow-up   HPI Diane Giles is a 62 y.o. female who presents to Primary Care at [facility] for f/u on DM. Last lipids 6 months prior, LDL at goal but non-HDL 10 over. On Lipitor 80. Was smoking 0.5 ppd, started Chantix and was able to stop. Last A1C 6.5. H/o persistently elevated WBCs and platelets, had almost resolved to normal at last visit. 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 Prior elevated alkaline phosphatase was continuing to trend towards normal as hip was healing.  Pt states that quitting smoking 02/23/17 and it was "the easiest thing she has ever done". She took Chantix for a little over 2 months but kept forgetting to take it so she quit taking it. Her husband still smokes but is very supportive and doesn't smoke around her. She has noticed some weight gain since quitting smoking but she has started walking on the treadmill at the gym with her husband for 1.5-2 miles 3 days/wk. States her kitchen is being remodeled for a few months so she would like to hold off of weight loss medication at this time.  HTN Denies cough, leg swelling.  DM Pt is taking Metformin 2 500 mg tabs bid. Denies myalgias. Optho: last visit was 2 months ago for Lasik f/u but pt reports she had a full retinal exam done at that time.  Past Medical History:  Diagnosis Date  . Arthritis   . Diabetes mellitus without complication (HCC)    per pt she is pre- diabetic  . Diverticulitis   . Hypertension   . Osteoporosis   . Sleep apnea    Current Outpatient Medications on File  Prior to Visit  Medication Sig Dispense Refill  . aspirin 81 MG chewable tablet Chew 81 mg by mouth daily.    Marland Kitchen atenolol-chlorthalidone (TENORETIC) 50-25 MG tablet TAKE 1 TABLET IN THE MORNING. 30 tablet 0  . atorvastatin (LIPITOR) 80 MG tablet TAKE 1 TABLET AT 6PM. 30 tablet 0  . blood glucose meter kit and supplies Dispense based on patient and insurance preference. Use as directed once a day. (DX: R73.9) 1 each 0  . furosemide (LASIX) 20 MG tablet TAKE 1 TABLET IN THE MORNING. 30 tablet 0  . glucose blood (GLUCOSE METER TEST) test strip Use as instructed once a day. (DX:R73.9) 50 each 2  . Lancets MISC Use as directed once a day. (DX: R73.9) 100 each 2  . losartan (COZAAR) 25 MG tablet TAKE 1 TABLET IN THE P.M. 30 tablet 0  . metFORMIN (GLUCOPHAGE) 1000 MG tablet Take 1 tablet (1,000 mg total) by mouth 2 (two) times daily with a meal. 180 tablet 3  . metFORMIN (GLUCOPHAGE) 500 MG tablet TAKE 2 TABLETS TWICE DAILY WITH MEALS. 120 tablet 0  . Omega-3 Fatty Acids (OMEGA 3 PO) Take 1 capsule by mouth 2 (two) times daily.     No current facility-administered medications on file prior to visit.    Allergies  Allergen Reactions  . Codeine Itching   Review of Systems  Respiratory: Negative for cough.   Cardiovascular: Negative for leg swelling.  Musculoskeletal: Negative for myalgias.      Objective:   Physical Exam  Constitutional: She is oriented to person, place, and time. She appears well-developed and well-nourished. No distress.  HENT:  Head: Normocephalic and atraumatic.  Eyes: Conjunctivae and EOM are normal.  Neck: Neck supple. No tracheal deviation present. No thyromegaly present.  Cardiovascular: Normal rate and regular rhythm.  Murmur heard.  Systolic (pansystolic murmur throughout) murmur is present. Pulmonary/Chest: Effort normal and breath sounds normal. No respiratory distress.  Musculoskeletal: Normal range of motion.  Lymphadenopathy:    She has no cervical  adenopathy.  Neurological: She is alert and oriented to person, place, and time.  Skin: Skin is warm and dry.  Psychiatric: She has a normal mood and affect. Her behavior is normal.  Nursing note and vitals reviewed.  BP 122/60 (BP Location: Left Arm, Patient Position: Sitting, Cuff Size: Large)   Pulse 74   Temp 97.9 F (36.6 C) (Oral)   Resp 18   Ht 4\' 10"  (1.473 m)   Wt 186 lb 6.4 oz (84.6 kg)   LMP 05/07/2005 (Approximate)   SpO2 97%   BMI 38.96 kg/m     Results for orders placed or performed in visit on 08/16/17  POCT glycosylated hemoglobin (Hb A1C)  Result Value Ref Range   Hemoglobin A1C 6.2    Assessment & Plan:  Vit D checked twice 3 yrs prior at 40s-50s. 1. Type 2 diabetes mellitus with hyperglycemia, without long-term current use of insulin (HCC)   2. Leukocytosis, unspecified type   3. Other osteoporosis without current pathological fracture     Orders Placed This Encounter  Procedures  . Comprehensive metabolic panel  . CBC with Differential/Platelet  . Microalbumin/Creatinine Ratio, Urine  . Microalbumin/Creatinine Ratio, Urine    Standing Status:   Future    Standing Expiration Date:   10/16/2017  . POCT glycosylated hemoglobin (Hb A1C)  . HM DIABETES FOOT EXAM    Meds ordered this encounter  Medications  . atenolol-chlorthalidone (TENORETIC) 50-25 MG tablet    Sig: TAKE 1 TABLET IN THE MORNING.    Dispense:  90 tablet    Refill:  3  . atorvastatin (LIPITOR) 80 MG tablet    Sig: Take 1 tablet (80 mg total) by mouth daily at 6 PM.    Dispense:  90 tablet    Refill:  3  . furosemide (LASIX) 20 MG tablet    Sig: Take 1 tablet (20 mg total) by mouth every morning.    Dispense:  90 tablet    Refill:  1  . losartan (COZAAR) 25 MG tablet    Sig: TAKE 1 TABLET IN THE P.M.    Dispense:  90 tablet    Refill:  3  . metFORMIN (GLUCOPHAGE) 500 MG tablet    Sig: TAKE 2 TABLETS TWICE DAILY WITH MEALS.    Dispense:  360 tablet    Refill:  3    I  personally performed the services described in this documentation, which was scribed in my presence. The recorded information has been reviewed and considered, and addended by me as needed.   Norberto Sorenson, M.D.  Primary Care at [facility] [address] [place], [place] [address] [phone] phone [phone] fax  09/20/17 5:32 PM

## 2017-09-20 ENCOUNTER — Other Ambulatory Visit: Payer: Self-pay

## 2017-09-20 ENCOUNTER — Encounter: Payer: Self-pay | Admitting: Family Medicine

## 2017-09-20 ENCOUNTER — Ambulatory Visit: Payer: 59 | Admitting: Family Medicine

## 2017-09-20 VITALS — BP 138/70 | HR 78 | Temp 98.3°F | Resp 18 | Ht <= 58 in | Wt 191.8 lb

## 2017-09-20 DIAGNOSIS — K5732 Diverticulitis of large intestine without perforation or abscess without bleeding: Secondary | ICD-10-CM

## 2017-09-20 DIAGNOSIS — R1032 Left lower quadrant pain: Secondary | ICD-10-CM | POA: Diagnosis not present

## 2017-09-20 LAB — POCT CBC
Granulocyte percent: 66.4 %G (ref 37–80)
HEMATOCRIT: 37.3 % — AB (ref 37.7–47.9)
HEMOGLOBIN: 12.2 g/dL (ref 12.2–16.2)
LYMPH, POC: 3.6 — AB (ref 0.6–3.4)
MCH, POC: 29.4 pg (ref 27–31.2)
MCHC: 32.8 g/dL (ref 31.8–35.4)
MCV: 89.5 fL (ref 80–97)
MID (cbc): 0.5 (ref 0–0.9)
MPV: 7.1 fL (ref 0–99.8)
PLATELET COUNT, POC: 425 10*3/uL — AB (ref 142–424)
POC GRANULOCYTE: 8.2 — AB (ref 2–6.9)
POC LYMPH %: 29.2 % (ref 10–50)
POC MID %: 4.4 %M (ref 0–12)
RBC: 4.16 M/uL (ref 4.04–5.48)
RDW, POC: 13.2 %
WBC: 12.4 10*3/uL — AB (ref 4.6–10.2)

## 2017-09-20 LAB — POCT URINALYSIS DIP (MANUAL ENTRY)
BILIRUBIN UA: NEGATIVE mg/dL
Bilirubin, UA: NEGATIVE
Blood, UA: NEGATIVE
Glucose, UA: NEGATIVE mg/dL
LEUKOCYTES UA: NEGATIVE
Nitrite, UA: NEGATIVE
PROTEIN UA: NEGATIVE mg/dL
SPEC GRAV UA: 1.01 (ref 1.010–1.025)
UROBILINOGEN UA: 0.2 U/dL
pH, UA: 5.5 (ref 5.0–8.0)

## 2017-09-20 LAB — HEMOCCULT GUIAC POC 1CARD (OFFICE): FECAL OCCULT BLD: NEGATIVE

## 2017-09-20 MED ORDER — ONDANSETRON HCL 4 MG PO TABS: 4.0000 mg | ORAL_TABLET | Freq: Three times a day (TID) | ORAL | 0 refills | Status: DC | PRN

## 2017-09-20 MED ORDER — CIPROFLOXACIN HCL 500 MG PO TABS: 500.0000 mg | ORAL_TABLET | Freq: Two times a day (BID) | ORAL | 0 refills | Status: DC

## 2017-09-20 MED ORDER — FLUCONAZOLE 150 MG PO TABS: 150.0000 mg | ORAL_TABLET | Freq: Once | ORAL | 0 refills | Status: AC

## 2017-09-20 MED ORDER — METRONIDAZOLE 500 MG PO TABS: 500.0000 mg | ORAL_TABLET | Freq: Three times a day (TID) | ORAL | 0 refills | Status: DC

## 2017-09-20 NOTE — Patient Instructions (Addendum)
IF you received an x-ray today, you will receive an invoice from [facility] Rad[facility]. Please contact [facility] at [phone] with questions or concerns regarding your invoice.   IF you received labwork today, you will receive an invoice from [facility]. Please contact [facility] at [phone] with questions or concerns regarding your invoice.   Our billing staff will not be able to assist you with questions regarding bills from these companies.  You will be contacted with the lab results as soon as they are available. The fastest way to get your results is to activate your My Chart account. Instructions are located on the last page of this paperwork. If you have not heard from [place] regarding the results in 2 weeks, please contact this office.    Full Liquid Diet A full liquid diet may be used:  To help you transition from a clear liquid diet to a soft diet.  When your body is healing and can only tolerate foods that are easy to digest.  Before or after certain a procedure, test, or surgery (such as stomach or intestinal surgeries).  If you have trouble swallowing or chewing.  A full liquid diet includes fluids and foods that are liquid or will become liquid at room temperature. The full liquid diet gives you the proteins, fluids, salts, and minerals that you need for energy. If you continue this diet for more than 72 hours, talk to your health care provider about how many calories you need to consume. If you continue the diet for more than 5 days, talk to your health care provider about taking a multivitamin or a nutritional supplement. What do I need to know about a full liquid diet?  You may have any liquid.  You may have any food that becomes a liquid at room temperature. The food is considered a liquid if it can be poured off a spoon at room temperature.  Drink one serving of citrus or vitamin C-enriched fruit juice daily. What foods can I eat? Grains Any grain  food that can be pureed in soup (such as crackers, pasta, and rice). Hot cereal (such as farina or oatmeal) that has been blended. Talk to your health care provider or dietitian about these foods. Vegetables Pulp-free tomato or vegetable juice. Vegetables pureed in soup. Fruits Fruit juice, including nectars and juices with pulp. Meats and Other Protein Sources Eggs in custard, eggnog mix, and eggs used in ice cream or pudding. Strained meats, like in baby food, may be allowed. Consult your health care provider. Dairy Milk and milk-based beverages, including milk shakes and instant breakfast mixes. Smooth yogurt. Pureed cottage cheese. Avoid these foods if they are not well tolerated. Beverages All beverages, including liquid nutritional supplements. Ask your health care provider if you can have carbonated beverages. They may not be well tolerated. Condiments Iodized salt, pepper, spices, and flavorings. Cocoa powder. Vinegar, ketchup, yellow mustard, smooth sauces (such as hollandaise, cheese sauce, or white sauce), and soy sauce. Sweets and Desserts Custard, smooth pudding. Flavored gelatin. Tapioca, junket. Plain ice cream, sherbet, fruit ices. Frozen ice pops, frozen fudge pops, pudding pops, and other frozen bars with cream. Syrups, including chocolate syrup. Sugar, honey, jelly. Fats and Oils Margarine, butter, cream, sour cream, and oils. Other Broth and cream soups. Strained, broth-based soups. The items listed above may not be a complete list of recommended foods or beverages. Contact your dietitian for more options. What foods can I not eat? Grains All breads. Grains are not  allowed unless they are pureed into soup. Vegetables Vegetables are not allowed unless they are juiced, or cooked and pureed into soup. Fruits Fruits are not allowed unless they are juiced. Meats and Other Protein Sources Any meat or fish. Cooked or raw eggs. Nut butters. Dairy Cheese. Condiments Stone  ground mustards. Fats and Oils Fats that are coarse or chunky. Sweets and Desserts Ice cream or other frozen desserts that have any solids in them or on top, such as nuts, chocolate chips, and pieces of cookies. Cakes. Cookies. Candy. Others Soups with chunks or pieces in them. The items listed above may not be a complete list of foods and beverages to avoid. Contact your dietitian for more information. This information is not intended to replace advice given to you by your health care provider. Make sure you discuss any questions you have with your health care provider. Document Released: 04/23/2005 Document Revised: 09/29/2015 Document Reviewed: 02/26/2013 Elsevier Interactive Patient Education  2017 Elsevier Inc.  Diverticulitis Diverticulitis is infection or inflammation of small pouches (diverticula) in the colon that form due to a condition called diverticulosis. Diverticula can trap stool (feces) and bacteria, causing infection and inflammation. Diverticulitis may cause severe stomach pain and diarrhea. It may lead to tissue damage in the colon that causes bleeding. The diverticula may also burst (rupture) and cause infected stool to enter other areas of the abdomen. Complications of diverticulitis can include:  Bleeding.  Severe infection.  Severe pain.  Rupture (perforation) of the colon.  Blockage (obstruction) of the colon.  What are the causes? This condition is caused by stool becoming trapped in the diverticula, which allows bacteria to grow in the diverticula. This leads to inflammation and infection. What increases the risk? You are more likely to develop this condition if:  You have diverticulosis. The risk for diverticulosis increases if: ? You are overweight or obese. ? You use tobacco products. ? You do not get enough exercise.  You eat a diet that does not include enough fiber. High-fiber foods include fruits, vegetables, beans, nuts, and whole  grains.  What are the signs or symptoms? Symptoms of this condition may include:  Pain and tenderness in the abdomen. The pain is normally located on the left side of the abdomen, but it may occur in other areas.  Fever and chills.  Bloating.  Cramping.  Nausea.  Vomiting.  Changes in bowel routines.  Blood in your stool.  How is this diagnosed? This condition is diagnosed based on:  Your medical history.  A physical exam.  Tests to make sure there is nothing else causing your condition. These tests may include: ? Blood tests. ? Urine tests. ? Imaging tests of the abdomen, including X-rays, ultrasounds, MRIs, or CT scans.  How is this treated? Most cases of this condition are mild and can be treated at home. Treatment may include:  Taking over-the-counter pain medicines.  Following a clear liquid diet.  Taking antibiotic medicines by mouth.  Rest.  More severe cases may need to be treated at a hospital. Treatment may include:  Not eating or drinking.  Taking prescription pain medicine.  Receiving antibiotic medicines through an IV tube.  Receiving fluids and nutrition through an IV tube.  Surgery.  When your condition is under control, your health care provider may recommend that you have a colonoscopy. This is an exam to look at the entire large intestine. During the exam, a lubricated, bendable tube is inserted into the anus and then passed into  the rectum, colon, and other parts of the large intestine. A colonoscopy can show how severe your diverticula are and whether something else may be causing your symptoms. Follow these instructions at home: Medicines  Take over-the-counter and prescription medicines only as told by your health care provider. These include fiber supplements, probiotics, and stool softeners.  If you were prescribed an antibiotic medicine, take it as told by your health care provider. Do not stop taking the antibiotic even if you  start to feel better.  Do not drive or use heavy machinery while taking prescription pain medicine. General instructions  Follow a full liquid diet or another diet as directed by your health care provider. After your symptoms improve, your health care provider may tell you to change your diet. He or she may recommend that you eat a diet that contains at least 25 g (25 grams) of fiber daily. Fiber makes it easier to pass stool. Healthy sources of fiber include: ? Berries. One cup contains 4-8 grams of fiber. ? Beans or lentils. One half cup contains 5-8 grams of fiber. ? Green vegetables. One cup contains 4 grams of fiber.  Exercise for at least 30 minutes, 3 times each week. You should exercise hard enough to raise your heart rate and break a sweat.  Keep all follow-up visits as told by your health care provider. This is important. You may need a colonoscopy. Contact a health care provider if:  Your pain does not improve.  You have a hard time drinking or eating food.  Your bowel movements do not return to normal. Get help right away if:  Your pain gets worse.  Your symptoms do not get better with treatment.  Your symptoms suddenly get worse.  You have a fever.  You vomit more than one time.  You have stools that are bloody, black, or tarry. Summary  Diverticulitis is infection or inflammation of small pouches (diverticula) in the colon that form due to a condition called diverticulosis. Diverticula can trap stool (feces) and bacteria, causing infection and inflammation.  You are at higher risk for this condition if you have diverticulosis and you eat a diet that does not include enough fiber.  Most cases of this condition are mild and can be treated at home. More severe cases may need to be treated at a hospital.  When your condition is under control, your health care provider may recommend that you have an exam called a colonoscopy. This exam can show how severe your  diverticula are and whether something else may be causing your symptoms. This information is not intended to replace advice given to you by your health care provider. Make sure you discuss any questions you have with your health care provider. Document Released: 01/31/2005 Document Revised: 05/26/2016 Document Reviewed: 05/26/2016 Elsevier Interactive Patient Education  2018 [place] Meal Plan A soft-food meal plan includes foods that are safe and easy to swallow. This meal plan typically is used:  If you are having trouble chewing or swallowing foods.  As a transition meal plan after only having had liquid meals for a long period.  What do I need to know about the soft-food meal plan? A soft-food meal plan includes tender foods that are soft and easy to chew and swallow. In most cases, bite-sized pieces of food are easier to swallow. A bite-sized piece is about  inch or smaller. Foods in this plan do not need to be ground or pureed. Foods that are very  hard, crunchy, or sticky should be avoided. Also, breads, cereals, yogurts, and desserts with nuts, seeds, or fruits should be avoided. What foods can I eat? Grains Rice and wild rice. Moist bread, dressing, pasta, and noodles. Well-moistened dry or cooked cereals, such as farina (cooked wheat cereal), oatmeal, or grits. Biscuits, breads, muffins, pancakes, and waffles that have been well moistened. Vegetables Shredded lettuce. Cooked, tender vegetables, including potatoes without skins. Vegetable juices. Broths or creamed soups made with vegetables that are not stringy or chewy. Strained tomatoes (without seeds). Fruits Canned or well-cooked fruits. Soft (ripe), peeled fresh fruits, such as peaches, nectarines, kiwi, cantaloupe, honeydew melon, and watermelon (without seeds). Soft berries with small seeds, such as strawberries. Fruit juices (without pulp). Meats and Other Protein Sources Moist, tender, lean beef. Mutton. Lamb.  Veal. Chicken. [place]. Liver. Ham. Fish without bones. Eggs. Dairy Milk, milk drinks, and cream. Plain cream cheese and cottage cheese. Plain yogurt. Sweets/Desserts Flavored gelatin desserts. Custard. Plain ice cream, frozen yogurt, sherbet, milk shakes, and malts. Plain cakes and cookies. Plain hard candy. Other Butter, margarine (without trans fat), and cooking oils. Mayonnaise. Cream sauces. Mild spices, salt, and sugar. Syrup, molasses, honey, and jelly. The items listed above may not be a complete list of recommended foods or beverages. Contact your dietitian for more options. What foods are not recommended? Grains Dry bread, toast, crackers that have not been moistened. Coarse or dry cereals, such as bran, granola, and shredded wheat. Tough or chewy crusty breads, such as [place] bread or baguettes. Vegetables Corn. Raw vegetables except shredded lettuce. Cooked vegetables that are tough or stringy. Tough, crisp, fried potatoes and potato skins. Fruits Fresh fruits with skins or seeds or both, such as apples, pears, or grapes. Stringy, high-pulp fruits, such as papaya, pineapple, coconut, or mango. Fruit leather, fruit roll-ups, and all dried fruits. Meats and Other Protein Sources Sausages and hot dogs. Meats with gristle. Fish with bones. Nuts, seeds, and chunky peanut or other nut butters. Sweets/Desserts Cakes or cookies that are very dry or chewy. The items listed above may not be a complete list of foods and beverages to avoid. Contact your dietitian for more information. This information is not intended to replace advice given to you by your health care provider. Make sure you discuss any questions you have with your health care provider. Document Released: 07/31/2007 Document Revised: 09/29/2015 Document Reviewed: 03/20/2013 Elsevier Interactive Patient Education  2017 [employer].

## 2017-09-20 NOTE — Progress Notes (Signed)
By signing my name below, I, Mayer Masker, attest that this documentation has been prepared under the direction and in the presence of Brigitte Pulse Laurey Arrow, MD. Electronically Signed: Mayer Masker, Medical Scribe 09/20/2017 at 6:16 PM.  Subjective:    Patient ID: Diane Giles, female    DOB: 1955/12/19, 62 y.o.   MRN: [id number] Chief Complaint  Patient presents with  . Stomach Pain    Almost x2 days, Pt states she is experiencing stomach spasms.     HPI Diane Giles is a 62 y.o. female who presents to Primary Care at [facility] complaining of waxing/waning left lower abdominal pain for 2 days that has gradually improved. She describes this as a 'stomach spasm'. Last bowel movement was this morning. She has mild occasional nausea. No recent abx use.   She denies fever, melena, chills, hematochezia, constipation, vomiting, and vaginal discharge.  She also complains of vaginal itching. She started putting a vaginal suppository (monostat) last night.   She has a h/o diverticulitis with small bowel obstruction and she has had these symptoms before. She does not feel bad enough to go to the hospital. Her pain is improved when she stands.   Her blood sugars have been normal recently.   Past Medical History:  Diagnosis Date  . Aortoiliac occlusive disease ([place]) 12/03/2016  . Arthritis   . Diabetes mellitus without complication ([place])    per pt she is pre- diabetic  . Diverticulitis   . Hypertension   . Osteoporosis   . Sleep apnea    Past Surgical History:  Procedure Laterality Date  . ABDOMINAL AORTOGRAM W/LOWER EXTREMITY N/A 12/03/2016   Procedure: Abdominal Aortogram w/Lower Extremity;  Surgeon: Angelia Mould, MD;  Location: [place] [facility];  Service: Cardiovascular;  Laterality: N/A;  . DILATATION & CURETTAGE/HYSTEROSCOPY WITH MYOSURE N/A 12/27/2015   Procedure: DILATATION & CURETTAGE/HYSTEROSCOPY;  Surgeon: Nunzio Cobbs, MD;  Location: [place] [facility];  Service: Gynecology;   Laterality: N/A;  . DILATION AND CURETTAGE OF UTERUS  11/2015  . ELBOW SURGERY Left ~2007   tendon repair by Dr. Veverly Fells  . EYE SURGERY Bilateral 2008   lasik  . lower aortic bypass  2000   per patient  . PERIPHERAL VASCULAR INTERVENTION  12/03/2016   Procedure: Peripheral Vascular Intervention;  Surgeon: Angelia Mould, MD;  Location: [place] [facility];  Service: Cardiovascular;;  . TOTAL HIP ARTHROPLASTY Right 07/27/2014   Procedure: RIGHT TOTAL HIP ARTHROPLASTY ANTERIOR APPROACH;  Surgeon: Paralee Cancel, MD;  Location: [facility];  Service: Orthopedics;  Laterality: Right;   Current Outpatient Medications on File Prior to Visit  Medication Sig Dispense Refill  . aspirin 81 MG chewable tablet Chew 81 mg by mouth daily.    Marland Kitchen atenolol-chlorthalidone (TENORETIC) 50-25 MG tablet TAKE 1 TABLET IN THE MORNING. 90 tablet 3  . atorvastatin (LIPITOR) 80 MG tablet Take 1 tablet (80 mg total) by mouth daily at 6 PM. 90 tablet 3  . blood glucose meter kit and supplies Dispense based on patient and insurance preference. Use as directed once a day. (DX: R73.9) 1 each 0  . glucose blood (GLUCOSE METER TEST) test strip Use as instructed once a day. (DX:R73.9) 50 each 2  . Lancets MISC Use as directed once a day. (DX: R73.9) 100 each 2  . losartan (COZAAR) 25 MG tablet TAKE 1 TABLET IN THE P.M. 90 tablet 3  . metFORMIN (GLUCOPHAGE) 1000 MG tablet Take 1 tablet (1,000 mg total) by mouth  By signing my name below, I, Mayer Masker, attest that this documentation has been prepared under the direction and in the presence of Brigitte Pulse Laurey Arrow, MD. Electronically Signed: Mayer Masker, Medical Scribe 09/20/2017 at 6:16 PM.  Subjective:    Patient ID: Diane Giles, female    DOB: 1955/12/19, 62 y.o.   MRN: [id number] Chief Complaint  Patient presents with  . Stomach Pain    Almost x2 days, Pt states she is experiencing stomach spasms.     HPI Diane Giles is a 62 y.o. female who presents to Primary Care at [facility] complaining of waxing/waning left lower abdominal pain for 2 days that has gradually improved. She describes this as a 'stomach spasm'. Last bowel movement was this morning. She has mild occasional nausea. No recent abx use.   She denies fever, melena, chills, hematochezia, constipation, vomiting, and vaginal discharge.  She also complains of vaginal itching. She started putting a vaginal suppository (monostat) last night.   She has a h/o diverticulitis with small bowel obstruction and she has had these symptoms before. She does not feel bad enough to go to the hospital. Her pain is improved when she stands.   Her blood sugars have been normal recently.   Past Medical History:  Diagnosis Date  . Aortoiliac occlusive disease ([place]) 12/03/2016  . Arthritis   . Diabetes mellitus without complication ([place])    per pt she is pre- diabetic  . Diverticulitis   . Hypertension   . Osteoporosis   . Sleep apnea    Past Surgical History:  Procedure Laterality Date  . ABDOMINAL AORTOGRAM W/LOWER EXTREMITY N/A 12/03/2016   Procedure: Abdominal Aortogram w/Lower Extremity;  Surgeon: Angelia Mould, MD;  Location: [place] [facility];  Service: Cardiovascular;  Laterality: N/A;  . DILATATION & CURETTAGE/HYSTEROSCOPY WITH MYOSURE N/A 12/27/2015   Procedure: DILATATION & CURETTAGE/HYSTEROSCOPY;  Surgeon: Nunzio Cobbs, MD;  Location: [place] [facility];  Service: Gynecology;   Laterality: N/A;  . DILATION AND CURETTAGE OF UTERUS  11/2015  . ELBOW SURGERY Left ~2007   tendon repair by Dr. Veverly Fells  . EYE SURGERY Bilateral 2008   lasik  . lower aortic bypass  2000   per patient  . PERIPHERAL VASCULAR INTERVENTION  12/03/2016   Procedure: Peripheral Vascular Intervention;  Surgeon: Angelia Mould, MD;  Location: [place] [facility];  Service: Cardiovascular;;  . TOTAL HIP ARTHROPLASTY Right 07/27/2014   Procedure: RIGHT TOTAL HIP ARTHROPLASTY ANTERIOR APPROACH;  Surgeon: Paralee Cancel, MD;  Location: [facility];  Service: Orthopedics;  Laterality: Right;   Current Outpatient Medications on File Prior to Visit  Medication Sig Dispense Refill  . aspirin 81 MG chewable tablet Chew 81 mg by mouth daily.    Marland Kitchen atenolol-chlorthalidone (TENORETIC) 50-25 MG tablet TAKE 1 TABLET IN THE MORNING. 90 tablet 3  . atorvastatin (LIPITOR) 80 MG tablet Take 1 tablet (80 mg total) by mouth daily at 6 PM. 90 tablet 3  . blood glucose meter kit and supplies Dispense based on patient and insurance preference. Use as directed once a day. (DX: R73.9) 1 each 0  . glucose blood (GLUCOSE METER TEST) test strip Use as instructed once a day. (DX:R73.9) 50 each 2  . Lancets MISC Use as directed once a day. (DX: R73.9) 100 each 2  . losartan (COZAAR) 25 MG tablet TAKE 1 TABLET IN THE P.M. 90 tablet 3  . metFORMIN (GLUCOPHAGE) 1000 MG tablet Take 1 tablet (1,000 mg total) by mouth  By signing my name below, I, Mayer Masker, attest that this documentation has been prepared under the direction and in the presence of Brigitte Pulse Laurey Arrow, MD. Electronically Signed: Mayer Masker, Medical Scribe 09/20/2017 at 6:16 PM.  Subjective:    Patient ID: Diane Giles, female    DOB: 05-18-1955, 63 y.o.   MRN: [id number] Chief Complaint  Patient presents with  . Stomach Pain    Almost x2 days, Pt states she is experiencing stomach spasms.     HPI Diane Giles is a 62 y.o. female who presents to Primary Care at [facility] complaining of waxing/waning left lower abdominal pain for 2 days that has gradually improved. She describes this as a 'stomach spasm'. Last bowel movement was this morning. She has mild occasional nausea. No recent abx use.   She denies fever, melena, chills, hematochezia, constipation, vomiting, and vaginal discharge.  She also complains of vaginal itching. She started putting a vaginal suppository (monostat) last night.   She has a h/o diverticulitis with small bowel obstruction and she has had these symptoms before. She does not feel bad enough to go to the hospital. Her pain is improved when she stands.   Her blood sugars have been normal recently.   Past Medical History:  Diagnosis Date  . Aortoiliac occlusive disease ([place]) 12/03/2016  . Arthritis   . Diabetes mellitus without complication ([place])    per pt she is pre- diabetic  . Diverticulitis   . Hypertension   . Osteoporosis   . Sleep apnea    Past Surgical History:  Procedure Laterality Date  . ABDOMINAL AORTOGRAM W/LOWER EXTREMITY N/A 12/03/2016   Procedure: Abdominal Aortogram w/Lower Extremity;  Surgeon: Angelia Mould, MD;  Location: [place] [facility];  Service: Cardiovascular;  Laterality: N/A;  . DILATATION & CURETTAGE/HYSTEROSCOPY WITH MYOSURE N/A 12/27/2015   Procedure: DILATATION & CURETTAGE/HYSTEROSCOPY;  Surgeon: Nunzio Cobbs, MD;  Location: [place] [facility];  Service: Gynecology;   Laterality: N/A;  . DILATION AND CURETTAGE OF UTERUS  11/2015  . ELBOW SURGERY Left ~2007   tendon repair by Dr. Veverly Fells  . EYE SURGERY Bilateral 2008   lasik  . lower aortic bypass  2000   per patient  . PERIPHERAL VASCULAR INTERVENTION  12/03/2016   Procedure: Peripheral Vascular Intervention;  Surgeon: Angelia Mould, MD;  Location: [place] [facility];  Service: Cardiovascular;;  . TOTAL HIP ARTHROPLASTY Right 07/27/2014   Procedure: RIGHT TOTAL HIP ARTHROPLASTY ANTERIOR APPROACH;  Surgeon: Paralee Cancel, MD;  Location: [facility];  Service: Orthopedics;  Laterality: Right;   Current Outpatient Medications on File Prior to Visit  Medication Sig Dispense Refill  . aspirin 81 MG chewable tablet Chew 81 mg by mouth daily.    Marland Kitchen atenolol-chlorthalidone (TENORETIC) 50-25 MG tablet TAKE 1 TABLET IN THE MORNING. 90 tablet 3  . atorvastatin (LIPITOR) 80 MG tablet Take 1 tablet (80 mg total) by mouth daily at 6 PM. 90 tablet 3  . blood glucose meter kit and supplies Dispense based on patient and insurance preference. Use as directed once a day. (DX: R73.9) 1 each 0  . glucose blood (GLUCOSE METER TEST) test strip Use as instructed once a day. (DX:R73.9) 50 each 2  . Lancets MISC Use as directed once a day. (DX: R73.9) 100 each 2  . losartan (COZAAR) 25 MG tablet TAKE 1 TABLET IN THE P.M. 90 tablet 3  . metFORMIN (GLUCOPHAGE) 1000 MG tablet Take 1 tablet (1,000 mg total) by mouth

## 2017-09-21 LAB — COMPREHENSIVE METABOLIC PANEL
ALK PHOS: 158 IU/L — AB (ref 39–117)
ALT: 26 IU/L (ref 0–32)
AST: 21 IU/L (ref 0–40)
Albumin/Globulin Ratio: 1.5 (ref 1.2–2.2)
Albumin: 4.2 g/dL (ref 3.6–4.8)
BUN/Creatinine Ratio: 21 (ref 12–28)
BUN: 18 mg/dL (ref 8–27)
Bilirubin Total: 0.3 mg/dL (ref 0.0–1.2)
CHLORIDE: 100 mmol/L (ref 96–106)
CO2: 23 mmol/L (ref 20–29)
Calcium: 10.6 mg/dL — ABNORMAL HIGH (ref 8.7–10.3)
Creatinine, Ser: 0.87 mg/dL (ref 0.57–1.00)
GFR calc Af Amer: 81 mL/min/{1.73_m2} (ref >59)
GFR calc non Af Amer: 71 mL/min/{1.73_m2} (ref >59)
GLUCOSE: 88 mg/dL (ref 65–99)
Globulin, Total: 2.8 g/dL (ref 1.5–4.5)
POTASSIUM: 4 mmol/L (ref 3.5–5.2)
Sodium: 142 mmol/L (ref 134–144)
Total Protein: 7 g/dL (ref 6.0–8.5)

## 2017-09-21 LAB — LIPASE: Lipase: 25 U/L (ref 14–72)

## 2017-09-23 ENCOUNTER — Ambulatory Visit: Payer: 59 | Admitting: Family Medicine

## 2017-09-23 ENCOUNTER — Encounter: Payer: Self-pay | Admitting: Family Medicine

## 2017-09-23 ENCOUNTER — Other Ambulatory Visit: Payer: Self-pay

## 2017-09-23 ENCOUNTER — Ambulatory Visit (INDEPENDENT_AMBULATORY_CARE_PROVIDER_SITE_OTHER): Payer: 59

## 2017-09-23 VITALS — BP 130/60 | HR 72 | Temp 98.1°F | Resp 18 | Ht <= 58 in | Wt 187.8 lb

## 2017-09-23 DIAGNOSIS — E1165 Type 2 diabetes mellitus with hyperglycemia: Secondary | ICD-10-CM

## 2017-09-23 DIAGNOSIS — K5732 Diverticulitis of large intestine without perforation or abscess without bleeding: Secondary | ICD-10-CM | POA: Diagnosis not present

## 2017-09-23 DIAGNOSIS — R739 Hyperglycemia, unspecified: Secondary | ICD-10-CM

## 2017-09-23 LAB — POCT CBC
GRANULOCYTE PERCENT: 72.2 % (ref 37–80)
HEMATOCRIT: 37.5 % — AB (ref 37.7–47.9)
Hemoglobin: 12.6 g/dL (ref 12.2–16.2)
LYMPH, POC: 2.6 (ref 0.6–3.4)
MCH, POC: 30.4 pg (ref 27–31.2)
MCHC: 33.5 g/dL (ref 31.8–35.4)
MCV: 90.6 fL (ref 80–97)
MID (cbc): 0.4 (ref 0–0.9)
MPV: 7.1 fL (ref 0–99.8)
POC GRANULOCYTE: 7.7 — AB (ref 2–6.9)
POC LYMPH PERCENT: 24.1 %L (ref 10–50)
POC MID %: 3.7 %M (ref 0–12)
Platelet Count, POC: 409 10*3/uL (ref 142–424)
RBC: 4.14 M/uL (ref 4.04–5.48)
RDW, POC: 13 %
WBC: 10.7 10*3/uL — AB (ref 4.6–10.2)

## 2017-09-23 LAB — GLUCOSE, POCT (MANUAL RESULT ENTRY): POC GLUCOSE: 101 mg/dL — AB (ref 70–99)

## 2017-09-23 NOTE — Patient Instructions (Addendum)
IF you received an x-ray today, you will receive an invoice from [facility]. Please contact [facility] at [phone] with questions or concerns regarding your invoice.   IF you received labwork today, you will receive an invoice from [facility]. Please contact [facility] at [phone] with questions or concerns regarding your invoice.   Our billing staff will not be able to assist you with questions regarding bills from these companies.  You will be contacted with the lab results as soon as they are available. The fastest way to get your results is to activate your My Chart account. Instructions are located on the last page of this paperwork. If you have not heard from [place] regarding the results in 2 weeks, please contact this office.     Soft-Food Meal Plan A soft-food meal plan includes foods that are safe and easy to swallow. This meal plan typically is used:  If you are having trouble chewing or swallowing foods.  As a transition meal plan after only having had liquid meals for a long period.  What do I need to know about the soft-food meal plan? A soft-food meal plan includes tender foods that are soft and easy to chew and swallow. In most cases, bite-sized pieces of food are easier to swallow. A bite-sized piece is about  inch or smaller. Foods in this plan do not need to be ground or pureed. Foods that are very hard, crunchy, or sticky should be avoided. Also, breads, cereals, yogurts, and desserts with nuts, seeds, or fruits should be avoided. What foods can I eat? Grains Rice and wild rice. Moist bread, dressing, pasta, and noodles. Well-moistened dry or cooked cereals, such as farina (cooked wheat cereal), oatmeal, or grits. Biscuits, breads, muffins, pancakes, and waffles that have been well moistened. Vegetables Shredded lettuce. Cooked, tender vegetables, including potatoes without skins. Vegetable juices. Broths or creamed soups made with vegetables  that are not stringy or chewy. Strained tomatoes (without seeds). Fruits Canned or well-cooked fruits. Soft (ripe), peeled fresh fruits, such as peaches, nectarines, kiwi, cantaloupe, honeydew melon, and watermelon (without seeds). Soft berries with small seeds, such as strawberries. Fruit juices (without pulp). Meats and Other Protein Sources Moist, tender, lean beef. Mutton. Lamb. Veal. Chicken. [place]. Liver. Ham. Fish without bones. Eggs. Dairy Milk, milk drinks, and cream. Plain cream cheese and cottage cheese. Plain yogurt. Sweets/Desserts Flavored gelatin desserts. Custard. Plain ice cream, frozen yogurt, sherbet, milk shakes, and malts. Plain cakes and cookies. Plain hard candy. Other Butter, margarine (without trans fat), and cooking oils. Mayonnaise. Cream sauces. Mild spices, salt, and sugar. Syrup, molasses, honey, and jelly. The items listed above may not be a complete list of recommended foods or beverages. Contact your dietitian for more options. What foods are not recommended? Grains Dry bread, toast, crackers that have not been moistened. Coarse or dry cereals, such as bran, granola, and shredded wheat. Tough or chewy crusty breads, such as [place] bread or baguettes. Vegetables Corn. Raw vegetables except shredded lettuce. Cooked vegetables that are tough or stringy. Tough, crisp, fried potatoes and potato skins. Fruits Fresh fruits with skins or seeds or both, such as apples, pears, or grapes. Stringy, high-pulp fruits, such as papaya, pineapple, coconut, or mango. Fruit leather, fruit roll-ups, and all dried fruits. Meats and Other Protein Sources Sausages and hot dogs. Meats with gristle. Fish with bones. Nuts, seeds, and chunky peanut or other nut butters. Sweets/Desserts Cakes or cookies that are very dry or chewy. The items listed  above may not be a complete list of foods and beverages to avoid. Contact your dietitian for more information. This information is not  intended to replace advice given to you by your health care provider. Make sure you discuss any questions you have with your health care provider. Document Released: 07/31/2007 Document Revised: 09/29/2015 Document Reviewed: 03/20/2013 Elsevier Interactive Patient Education  2017 Elsevier Inc.  Diverticulitis Diverticulitis is infection or inflammation of small pouches (diverticula) in the colon that form due to a condition called diverticulosis. Diverticula can trap stool (feces) and bacteria, causing infection and inflammation. Diverticulitis may cause severe stomach pain and diarrhea. It may lead to tissue damage in the colon that causes bleeding. The diverticula may also burst (rupture) and cause infected stool to enter other areas of the abdomen. Complications of diverticulitis can include:  Bleeding.  Severe infection.  Severe pain.  Rupture (perforation) of the colon.  Blockage (obstruction) of the colon.  What are the causes? This condition is caused by stool becoming trapped in the diverticula, which allows bacteria to grow in the diverticula. This leads to inflammation and infection. What increases the risk? You are more likely to develop this condition if:  You have diverticulosis. The risk for diverticulosis increases if: ? You are overweight or obese. ? You use tobacco products. ? You do not get enough exercise.  You eat a diet that does not include enough fiber. High-fiber foods include fruits, vegetables, beans, nuts, and whole grains.  What are the signs or symptoms? Symptoms of this condition may include:  Pain and tenderness in the abdomen. The pain is normally located on the left side of the abdomen, but it may occur in other areas.  Fever and chills.  Bloating.  Cramping.  Nausea.  Vomiting.  Changes in bowel routines.  Blood in your stool.  How is this diagnosed? This condition is diagnosed based on:  Your medical history.  A physical  exam.  Tests to make sure there is nothing else causing your condition. These tests may include: ? Blood tests. ? Urine tests. ? Imaging tests of the abdomen, including X-rays, ultrasounds, MRIs, or CT scans.  How is this treated? Most cases of this condition are mild and can be treated at home. Treatment may include:  Taking over-the-counter pain medicines.  Following a clear liquid diet.  Taking antibiotic medicines by mouth.  Rest.  More severe cases may need to be treated at a hospital. Treatment may include:  Not eating or drinking.  Taking prescription pain medicine.  Receiving antibiotic medicines through an IV tube.  Receiving fluids and nutrition through an IV tube.  Surgery.  When your condition is under control, your health care provider may recommend that you have a colonoscopy. This is an exam to look at the entire large intestine. During the exam, a lubricated, bendable tube is inserted into the anus and then passed into the rectum, colon, and other parts of the large intestine. A colonoscopy can show how severe your diverticula are and whether something else may be causing your symptoms. Follow these instructions at home: Medicines  Take over-the-counter and prescription medicines only as told by your health care provider. These include fiber supplements, probiotics, and stool softeners.  If you were prescribed an antibiotic medicine, take it as told by your health care provider. Do not stop taking the antibiotic even if you start to feel better.  Do not drive or use heavy machinery while taking prescription pain medicine. General instructions  Follow a full liquid diet or another diet as directed by your health care provider. After your symptoms improve, your health care provider may tell you to change your diet. He or she may recommend that you eat a diet that contains at least 25 g (25 grams) of fiber daily. Fiber makes it easier to pass stool. Healthy  sources of fiber include: ? Berries. One cup contains 4-8 grams of fiber. ? Beans or lentils. One half cup contains 5-8 grams of fiber. ? Green vegetables. One cup contains 4 grams of fiber.  Exercise for at least 30 minutes, 3 times each week. You should exercise hard enough to raise your heart rate and break a sweat.  Keep all follow-up visits as told by your health care provider. This is important. You may need a colonoscopy. Contact a health care provider if:  Your pain does not improve.  You have a hard time drinking or eating food.  Your bowel movements do not return to normal. Get help right away if:  Your pain gets worse.  Your symptoms do not get better with treatment.  Your symptoms suddenly get worse.  You have a fever.  You vomit more than one time.  You have stools that are bloody, black, or tarry. Summary  Diverticulitis is infection or inflammation of small pouches (diverticula) in the colon that form due to a condition called diverticulosis. Diverticula can trap stool (feces) and bacteria, causing infection and inflammation.  You are at higher risk for this condition if you have diverticulosis and you eat a diet that does not include enough fiber.  Most cases of this condition are mild and can be treated at home. More severe cases may need to be treated at a hospital.  When your condition is under control, your health care provider may recommend that you have an exam called a colonoscopy. This exam can show how severe your diverticula are and whether something else may be causing your symptoms. This information is not intended to replace advice given to you by your health care provider. Make sure you discuss any questions you have with your health care provider. Document Released: 01/31/2005 Document Revised: 05/26/2016 Document Reviewed: 05/26/2016 Elsevier Interactive Patient Education  [employer].

## 2017-09-23 NOTE — Progress Notes (Signed)
Subjective:  By signing my name below, I, [name], attest that this documentation has been prepared under the direction and in the presence of Norberto Sorenson, MD Electronically Signed: Charline Bills, ED Scribe 09/23/2017 at 12:53 PM.   Patient ID: Diane Giles, female    DOB: 1955/05/26, 62 y.o.   MRN: [id number]  Chief Complaint  Patient presents with  . Abdominal Cramping    Pt states cramping is about the same.   . Follow-up   HPI Diane Giles is a 62 y.o. female who presents to Primary Care at [facility] for f/u of abdominal pain. Pt states that abdominal cramping is unchanged. Normal BM yesterday and no abdominal pain yesterday until last night. Reports gurgling sounds in the abdomen since last night, watery diarrhea, cramping and mild nausea returned this morning. She has not had to use Zofran. Denies belching, flatulence.  Past Medical History:  Diagnosis Date  . Aortoiliac occlusive disease (HCC) 12/03/2016  . Arthritis   . Diabetes mellitus without complication (HCC)    per pt she is pre- diabetic  . Diverticulitis   . Hypertension   . Osteoporosis   . Sleep apnea    Current Outpatient Medications on File Prior to Visit  Medication Sig Dispense Refill  . aspirin 81 MG chewable tablet Chew 81 mg by mouth daily.    Marland Kitchen atenolol-chlorthalidone (TENORETIC) 50-25 MG tablet TAKE 1 TABLET IN THE MORNING. 90 tablet 3  . atorvastatin (LIPITOR) 80 MG tablet Take 1 tablet (80 mg total) by mouth daily at 6 PM. 90 tablet 3  . blood glucose meter kit and supplies Dispense based on patient and insurance preference. Use as directed once a day. (DX: R73.9) 1 each 0  . ciprofloxacin (CIPRO) 500 MG tablet Take 1 tablet (500 mg total) by mouth 2 (two) times daily. 20 tablet 0  . furosemide (LASIX) 20 MG tablet Take 1 tablet (20 mg total) by mouth every morning. 90 tablet 1  . glucose blood (GLUCOSE METER TEST) test strip Use as instructed once a day. (DX:R73.9) 50 each 2  . Lancets MISC  Use as directed once a day. (DX: R73.9) 100 each 2  . losartan (COZAAR) 25 MG tablet TAKE 1 TABLET IN THE P.M. 90 tablet 3  . metFORMIN (GLUCOPHAGE) 1000 MG tablet Take 1 tablet (1,000 mg total) by mouth 2 (two) times daily with a meal. 180 tablet 3  . metFORMIN (GLUCOPHAGE) 500 MG tablet TAKE 2 TABLETS TWICE DAILY WITH MEALS. 360 tablet 3  . metroNIDAZOLE (FLAGYL) 500 MG tablet Take 1 tablet (500 mg total) by mouth 3 (three) times daily. 30 tablet 0  . Omega-3 Fatty Acids (OMEGA 3 PO) Take 1 capsule by mouth 2 (two) times daily.    . ondansetron (ZOFRAN) 4 MG tablet Take 1 tablet (4 mg total) by mouth every 8 (eight) hours as needed for nausea or vomiting. 20 tablet 0   No current facility-administered medications on file prior to visit.    Allergies  Allergen Reactions  . Codeine Itching   Review of Systems  Gastrointestinal: Positive for abdominal pain (unchanged), diarrhea and nausea (mild).      Objective:   Physical Exam  Constitutional: She is oriented to person, place, and time. She appears well-developed and well-nourished. No distress.  HENT:  Head: Normocephalic and atraumatic.  Eyes: Conjunctivae and EOM are normal.  Neck: Neck supple. No tracheal deviation present.  Cardiovascular: Normal rate.  Pulmonary/Chest: Effort normal. No respiratory distress.  Abdominal: She exhibits distension (mild). Bowel sounds are increased. There is generalized tenderness (mild) and tenderness in the suprapubic area and left lower quadrant.  Tympanitic bowel sounds.  Musculoskeletal: Normal range of motion.  Neurological: She is alert and oriented to person, place, and time.  Skin: Skin is warm and dry.  Psychiatric: She has a normal mood and affect. Her behavior is normal.  Nursing note and vitals reviewed.  BP 130/60 (BP Location: Left Arm, Patient Position: Sitting, Cuff Size: Large)   Pulse 72   Temp 98.1 F (36.7 C) (Oral)   Resp 18   Ht 4\' 10"  (1.473 m)   Wt 187 lb 12.8 oz  (85.2 kg)   LMP 05/07/2005 (Approximate)   SpO2 96%   BMI 39.25 kg/m     CLINICAL DATA:  Three day treatment for diverticulitis. Generalized pain.  EXAM: DG ABDOMEN ACUTE W/ 1V CHEST  COMPARISON:  None.  FINDINGS: There is no evidence of dilated bowel loops or free intraperitoneal air. No radiopaque calculi or other significant radiographic abnormality is seen. Heart size and mediastinal contours are within normal limits. Both lungs are clear.  Right hip arthroplasty.  IMPRESSION: Negative abdominal radiographs.  No acute cardiopulmonary disease.   Electronically Signed   By: Elige Ko   On: 09/23/2017 13:31  Results for orders placed or performed in visit on 09/23/17  POCT CBC  Result Value Ref Range   WBC 10.7 (A) 4.6 - 10.2 K/uL   Lymph, poc 2.6 0.6 - 3.4   POC LYMPH PERCENT 24.1 10 - 50 %L   MID (cbc) 0.4 0 - 0.9   POC MID % 3.7 0 - 12 %M   POC Granulocyte 7.7 (A) 2 - 6.9   Granulocyte percent 72.2 37 - 80 %G   RBC 4.14 4.04 - 5.48 M/uL   Hemoglobin 12.6 12.2 - 16.2 g/dL   HCT, POC 29.5 (A) 62.1 - 47.9 %   MCV 90.6 80 - 97 fL   MCH, POC 30.4 27 - 31.2 pg   MCHC 33.5 31.8 - 35.4 g/dL   RDW, POC 30.8 %   Platelet Count, POC 409 142 - 424 K/uL   MPV 7.1 0 - 99.8 fL  POCT glucose (manual entry)  Result Value Ref Range   POC Glucose 101 (A) 70 - 99 mg/dl    Assessment & Plan:   1. Diverticulitis of colon without hemorrhage   2. Type 2 diabetes mellitus with hyperglycemia, without long-term current use of insulin (HCC)   3. Hyperglycemia     Orders Placed This Encounter  Procedures  . DG Abd Acute W/Chest    Standing Status:   Future    Number of Occurrences:   1    Standing Expiration Date:   09/23/2018    Order Specific Question:   Reason for exam:    Answer:   day 3 treatment for diverticulitis, more genderalized pain with hyperactive typmpantitic bowel sounds, check for signs of poss obstructions    Order Specific Question:   Preferred  imaging location?    Answer:   External  . Basic metabolic panel    Order Specific Question:   Has the patient fasted?    Answer:   No  . POCT CBC  . POCT glucose (manual entry)    I personally performed the services described in this documentation, which was scribed in my presence. The recorded information has been reviewed and considered, and addended by me as needed.  Norberto Sorenson, M.D.  Primary Care at [facility] [address] [place], [place] [address] [phone] phone [phone] fax  02/12/18 11:39 PM

## 2017-09-24 LAB — BASIC METABOLIC PANEL
BUN / CREAT RATIO: 18 (ref 12–28)
BUN: 19 mg/dL (ref 8–27)
CO2: 22 mmol/L (ref 20–29)
CREATININE: 1.07 mg/dL — AB (ref 0.57–1.00)
Calcium: 10.7 mg/dL — ABNORMAL HIGH (ref 8.7–10.3)
Chloride: 99 mmol/L (ref 96–106)
GFR calc non Af Amer: 55 mL/min/{1.73_m2} — ABNORMAL LOW (ref >59)
GFR, EST AFRICAN AMERICAN: 63 mL/min/{1.73_m2} (ref >59)
Glucose: 102 mg/dL — ABNORMAL HIGH (ref 65–99)
Potassium: 4.2 mmol/L (ref 3.5–5.2)
Sodium: 142 mmol/L (ref 134–144)

## 2018-01-17 ENCOUNTER — Telehealth: Payer: Self-pay | Admitting: Family Medicine

## 2018-01-17 NOTE — Telephone Encounter (Signed)
LVM to call back and reschedule her appt.  She may reschedule for November with Brigitte Pulse or anytime with another provider.

## 2018-02-05 ENCOUNTER — Other Ambulatory Visit: Payer: Self-pay | Admitting: Family Medicine

## 2018-02-05 ENCOUNTER — Ambulatory Visit (HOSPITAL_COMMUNITY)
Admission: RE | Admit: 2018-02-05 | Discharge: 2018-02-05 | Disposition: A | Discharge status: Home / Self Care | Payer: 59 | Source: Ambulatory Visit | Attending: Vascular Surgery | Admitting: Vascular Surgery

## 2018-02-05 ENCOUNTER — Other Ambulatory Visit: Payer: Self-pay

## 2018-02-05 ENCOUNTER — Ambulatory Visit (INDEPENDENT_AMBULATORY_CARE_PROVIDER_SITE_OTHER): Payer: 59 | Admitting: Vascular Surgery

## 2018-02-05 ENCOUNTER — Ambulatory Visit (INDEPENDENT_AMBULATORY_CARE_PROVIDER_SITE_OTHER)
Admission: RE | Admit: 2018-02-05 | Discharge: 2018-02-05 | Disposition: A | Discharge status: Home / Self Care | Payer: 59 | Source: Ambulatory Visit | Attending: Vascular Surgery | Admitting: Vascular Surgery

## 2018-02-05 ENCOUNTER — Encounter: Payer: Self-pay | Admitting: Vascular Surgery

## 2018-02-05 VITALS — BP 169/70 | HR 61 | Temp 97.4°F | Resp 16 | Ht 58.5 in | Wt 188.0 lb

## 2018-02-05 DIAGNOSIS — I7409 Other arterial embolism and thrombosis of abdominal aorta: Secondary | ICD-10-CM

## 2018-02-05 DIAGNOSIS — I6522 Occlusion and stenosis of left carotid artery: Secondary | ICD-10-CM | POA: Insufficient documentation

## 2018-02-05 DIAGNOSIS — I739 Peripheral vascular disease, unspecified: Secondary | ICD-10-CM | POA: Insufficient documentation

## 2018-02-05 DIAGNOSIS — I6529 Occlusion and stenosis of unspecified carotid artery: Secondary | ICD-10-CM

## 2018-02-05 DIAGNOSIS — I723 Aneurysm of iliac artery: Secondary | ICD-10-CM | POA: Diagnosis not present

## 2018-02-05 NOTE — Progress Notes (Signed)
Patient name: Diane Giles MRN: [id number] DOB: 1956/03/20 Sex: female  REASON FOR VISIT:   Follow-up of carotid disease and peripheral vascular disease.  HPI:   Diane Giles is a pleasant 62 y.o. female who comes in for routine follow-up visit.  She underwent an aortoiliac bypass in 2000.  Patient was having some hip pain and I saw her in consultation on 11/14/2016.  In reviewing her previous op note she had a distal aortic occlusion and underwent an aortobiiliac graft in 2000.  When I saw she was having pain in the right groin brought on by ambulation and relieved with rest.  The patient was found on duplex to have a significant stenosis in the proximal right limb of her aortobiiliac graft.  I thought this likely explains her symptoms.  On 12/03/2016, she underwent angioplasty and stenting of the stenosis in the right limb of her aortoiliac graft this was done with a 7 mm x 39 mm VBX covered stent.  In addition, the patient had carotid bruits which prompted a duplex scan which was done today.  Patient denies any history of stroke, TIAs, expressive or receptive aphasia, or amaurosis fugax.  She does occasionally get some paresthesias in the right arm and she says that sometimes it is difficult to get a blood pressure in the right arm.  She denies any problems with dizziness.  She denies any history of claudication, rest pain, or nonhealing ulcers.  She quit smoking a year ago.  She is on aspirin and is on a statin.  Past Medical History:  Diagnosis Date  . Aortoiliac occlusive disease (HCC) 12/03/2016  . Arthritis   . Diabetes mellitus without complication (HCC)    per pt she is pre- diabetic  . Diverticulitis   . Hypertension   . Osteoporosis   . Sleep apnea     Family History  Problem Relation Age of Onset  . Cancer Father 63       Dec age 67 with pancreatic Ca  . Migraines Mother   . Thyroid disease Mother        hypothyroid    SOCIAL HISTORY: Social History    Tobacco Use  . Smoking status: Former Smoker    Years: 40.00    Types: Cigarettes    Last attempt to quit: 02/23/2017    Years since quitting: 0.9  . Smokeless tobacco: Never Used  Substance Use Topics  . Alcohol use: Yes    Alcohol/week: 0.0 standard drinks    Comment: very rare--1/month    Allergies  Allergen Reactions  . Codeine Itching    Current Outpatient Medications  Medication Sig Dispense Refill  . aspirin 81 MG chewable tablet Chew 81 mg by mouth daily.    Marland Kitchen atenolol-chlorthalidone (TENORETIC) 50-25 MG tablet TAKE 1 TABLET IN THE MORNING. 90 tablet 3  . atorvastatin (LIPITOR) 80 MG tablet Take 1 tablet (80 mg total) by mouth daily at 6 PM. 90 tablet 3  . blood glucose meter kit and supplies Dispense based on patient and insurance preference. Use as directed once a day. (DX: R73.9) 1 each 0  . ciprofloxacin (CIPRO) 500 MG tablet Take 1 tablet (500 mg total) by mouth 2 (two) times daily. 20 tablet 0  . furosemide (LASIX) 20 MG tablet TAKE 1 TABLET IN THE MORNING. 60 tablet 0  . glucose blood (GLUCOSE METER TEST) test strip Use as instructed once a day. (DX:R73.9) 50 each 2  . Lancets MISC Use as directed once  a day. (DX: R73.9) 100 each 2  . losartan (COZAAR) 25 MG tablet TAKE 1 TABLET IN THE P.M. 90 tablet 3  . metFORMIN (GLUCOPHAGE) 1000 MG tablet Take 1 tablet (1,000 mg total) by mouth 2 (two) times daily with a meal. 180 tablet 3  . metFORMIN (GLUCOPHAGE) 500 MG tablet TAKE 2 TABLETS TWICE DAILY WITH MEALS. 360 tablet 3  . metroNIDAZOLE (FLAGYL) 500 MG tablet Take 1 tablet (500 mg total) by mouth 3 (three) times daily. 30 tablet 0  . Omega-3 Fatty Acids (OMEGA 3 PO) Take 1 capsule by mouth 2 (two) times daily.    . ondansetron (ZOFRAN) 4 MG tablet Take 1 tablet (4 mg total) by mouth every 8 (eight) hours as needed for nausea or vomiting. 20 tablet 0   No current facility-administered medications for this visit.     REVIEW OF SYSTEMS:  [X]  denotes positive  finding, [ ]  denotes negative finding Cardiac  Comments:  Chest pain or chest pressure:    Shortness of breath upon exertion:    Short of breath when lying flat:    Irregular heart rhythm:        Vascular    Pain in calf, thigh, or hip brought on by ambulation:    Pain in feet at night that wakes you up from your sleep:     Blood clot in your veins:    Leg swelling:         Pulmonary    Oxygen at home:    Productive cough:     Wheezing:         Neurologic    Sudden weakness in arms or legs:     Sudden numbness in arms or legs:     Sudden onset of difficulty speaking or slurred speech:    Temporary loss of vision in one eye:     Problems with dizziness:         Gastrointestinal    Blood in stool:     Vomited blood:         Genitourinary    Burning when urinating:     Blood in urine:        Psychiatric    Major depression:         Hematologic    Bleeding problems:    Problems with blood clotting too easily:        Skin    Rashes or ulcers:        Constitutional    Fever or chills:     PHYSICAL EXAM:   Vitals:   02/05/18 0956  BP: (!) 169/70  Pulse: 61  Resp: 16  Temp: (!) 97.4 F (36.3 C)  TempSrc: Oral  SpO2: 93%  Weight: 188 lb (85.3 kg)  Height: 4' 10.5" (1.486 m)    GENERAL: The patient is a well-nourished female, in no acute distress. The vital signs are documented above. CARDIAC: There is a regular rate and rhythm.  VASCULAR: She has bilateral carotid bruits. I cannot palpate pedal pulses however both feet are warm and well-perfused. She has bilateral lower extremity swelling. PULMONARY: There is good air exchange bilaterally without wheezing or rales. ABDOMEN: Soft and non-tender with normal pitched bowel sounds.  MUSCULOSKELETAL: There are no major deformities or cyanosis. NEUROLOGIC: No focal weakness or paresthesias are detected. SKIN: There are no ulcers or rashes noted. PSYCHIATRIC: The patient has a normal affect.  DATA:    DUPLEX  ABDOMINAL AORTA: I have independently interpreted the duplex  of her abdominal aortic graft.  Both limbs of the graft are widely patent with triphasic flow throughout.  There is no evidence of significant stenosis noted on either side.  ARTERIAL DOPPLER STUDY: I have independently interpreted her arterial Doppler study today.  On the right side there is a triphasic dorsalis pedis and posterior tibial signal.  ABI is 99% with a toe pressure of 127 mmHg.  On the left side there is a triphasic dorsalis pedis and posterior tibial signal.  ABIs 94% with a toe pressure of 109 mmHg.  CAROTID DUPLEX: I have independently interpreted her carotid duplex scan today.  The patient has evidence of an innominate artery stenosis which would explain the difficulty she has had in the past getting blood pressures in the right arm.  In the right carotid there is some smooth homogeneous plaque but this she does not appear to have significant stenosis.  However the velocities are not reliable given that she has a proximal stenosis.  On the left side she has a 60 to 79% internal carotid artery stenosis in the mid ICA.   The left vertebral artery has antegrade flow in the right vertebral artery has retrograde flow.  There appears to be a subclavian artery stenosis on the right.  MEDICAL ISSUES:   AORTOILIAC OCCLUSIVE DISEASE: The patient is undergone previous aortoiliac bypass grafting in 2000.  Most recently she sat angioplasty and stenting of a stenosis within the right limb of her graft with a covered stent.  This was on 12/03/2016.  She is done well from that standpoint.  She is quit smoking.  She is on aspirin and is on a statin.  I encouraged her to stay as active as possible.  I have ordered follow-up studies in 1 year.  This will include ABIs and a duplex of her graft.  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  She does have problems getting  blood pressure in the right arm which is likely related to this.  On the left side she has a 60 to 79% stenosis.  I explained we would not consider carotid endarterectomy on the left unless she developed left hemispheric symptoms or the stenosis progressed to greater than 80%.  She is on aspirin and a statin.  She is not a smoker.  She quit a year ago.  If her stenosis on the left progresses she would need a CT angiogram given that she has multilevel disease.  She has an innominate stenosis, a left subclavian stenosis, evidence of disease in the common carotid artery on the left in addition to her disease in the internal carotid artery.  I have ordered a follow-up carotid duplex scan in 6 months and I will see her back at that time.  She knows to call sooner if she has problems.  Waverly Ferrari [facility] [phone]

## 2018-02-05 NOTE — Telephone Encounter (Signed)
Requested Prescriptions  Pending Prescriptions Disp Refills  . furosemide (LASIX) 20 MG tablet [Pharmacy Med Name: FUROSEMIDE 20 MG TABLET] 60 tablet 0    Sig: TAKE 1 TABLET IN THE MORNING.     Cardiovascular:  Diuretics - Loop Failed - 02/05/2018  9:30 AM      Failed - Ca in normal range and within 360 days    Calcium  Date Value Ref Range Status  09/23/2017 10.7 (H) 8.7 - 10.3 mg/dL Final         Failed - Cr in normal range and within 360 days    Creat  Date Value Ref Range Status  10/27/2015 0.75 0.50 - 0.99 mg/dL Final    Comment:      For patients > or = 64 years of age: The upper reference limit for Creatinine is approximately 13% higher for people identified as African-American.      Creatinine, Ser  Date Value Ref Range Status  09/23/2017 1.07 (H) 0.57 - 1.00 mg/dL Final         Passed - K in normal range and within 360 days    Potassium  Date Value Ref Range Status  09/23/2017 4.2 3.5 - 5.2 mmol/L Final         Passed - Na in normal range and within 360 days    Sodium  Date Value Ref Range Status  09/23/2017 142 134 - 144 mmol/L Final         Passed - Last BP in normal range    BP Readings from Last 1 Encounters:  09/23/17 130/60         Passed - Valid encounter within last 6 months    Recent Outpatient Visits          4 months ago Diverticulitis of colon without hemorrhage   [facility], Laurey Arrow, MD   4 months ago Abdominal cramping in left lower quadrant   [facility], Laurey Arrow, MD   5 months ago Type 2 diabetes mellitus with hyperglycemia, without long-term current use of insulin [facility])   [facility], Laurey Arrow, MD   1 year ago Type 2 diabetes mellitus with hyperglycemia, without long-term current use of insulin [facility])   [facility], Laurey Arrow, MD   1 year ago Hx of perforation of tympanic membrane   [facility] [place], [place], [place]      Future Appointments            In 1 month  Brigitte Pulse, Laurey Arrow, MD [facility] [place], [place] [facility]

## 2018-02-13 ENCOUNTER — Ambulatory Visit: Payer: 59 | Admitting: Family Medicine

## 2018-02-17 ENCOUNTER — Ambulatory Visit: Payer: 59 | Admitting: Family Medicine

## 2018-02-21 ENCOUNTER — Telehealth: Payer: Self-pay | Admitting: Family Medicine

## 2018-02-21 NOTE — Telephone Encounter (Signed)
Patient called back ok to change her appointment

## 2018-02-21 NOTE — Telephone Encounter (Signed)
LVM for pt to call the office and let us know if she can push up her appt form 8:40 to 8:30 on 03/21/18 with Dr. Brigitte Pulse. Due to template changes, we need to move it 10 minutes.   If pt calls back and is acceptable to this time, please place in their notes.   No further action needed.   Thank you!

## 2018-03-12 ENCOUNTER — Encounter: Payer: Self-pay | Admitting: Family Medicine

## 2018-03-21 ENCOUNTER — Encounter: Payer: Self-pay | Admitting: Family Medicine

## 2018-03-21 ENCOUNTER — Other Ambulatory Visit: Payer: Self-pay

## 2018-03-21 ENCOUNTER — Ambulatory Visit: Payer: 59 | Admitting: Family Medicine

## 2018-03-21 VITALS — BP 130/72 | HR 66 | Temp 98.7°F | Resp 16 | Ht 59.0 in | Wt 189.2 lb

## 2018-03-21 DIAGNOSIS — R748 Abnormal levels of other serum enzymes: Secondary | ICD-10-CM

## 2018-03-21 DIAGNOSIS — N179 Acute kidney failure, unspecified: Secondary | ICD-10-CM

## 2018-03-21 DIAGNOSIS — I10 Essential (primary) hypertension: Secondary | ICD-10-CM

## 2018-03-21 DIAGNOSIS — E782 Mixed hyperlipidemia: Secondary | ICD-10-CM

## 2018-03-21 DIAGNOSIS — M810 Age-related osteoporosis without current pathological fracture: Secondary | ICD-10-CM

## 2018-03-21 DIAGNOSIS — E1165 Type 2 diabetes mellitus with hyperglycemia: Secondary | ICD-10-CM

## 2018-03-21 DIAGNOSIS — Z79899 Other long term (current) drug therapy: Secondary | ICD-10-CM

## 2018-03-21 DIAGNOSIS — Z5181 Encounter for therapeutic drug level monitoring: Secondary | ICD-10-CM

## 2018-03-21 DIAGNOSIS — J432 Centrilobular emphysema: Secondary | ICD-10-CM

## 2018-03-21 DIAGNOSIS — Z23 Encounter for immunization: Secondary | ICD-10-CM

## 2018-03-21 DIAGNOSIS — G4701 Insomnia due to medical condition: Secondary | ICD-10-CM

## 2018-03-21 DIAGNOSIS — E876 Hypokalemia: Secondary | ICD-10-CM

## 2018-03-21 DIAGNOSIS — G4733 Obstructive sleep apnea (adult) (pediatric): Secondary | ICD-10-CM

## 2018-03-21 LAB — POCT GLYCOSYLATED HEMOGLOBIN (HGB A1C): HEMOGLOBIN A1C: 6.5 % — AB (ref 4.0–5.6)

## 2018-03-21 MED ORDER — FUROSEMIDE 20 MG PO TABS: 20.0000 mg | ORAL_TABLET | Freq: Every morning | ORAL | 1 refills | Status: DC

## 2018-03-21 MED ORDER — ZOLPIDEM TARTRATE 5 MG PO TABS: 5.0000 mg | ORAL_TABLET | Freq: Every evening | ORAL | 0 refills | Status: DC | PRN

## 2018-03-21 NOTE — Progress Notes (Signed)
Subjective:    Patient: Diane Giles  DOB: [birth year]; 62 y.o.   MRN: [id number]  Chief Complaint  Patient presents with  . Diabetes    f/u 6 mos    HPI She admits she has had worse med compliance with fatigue which is triggering more forgetfullness. All her meds come in a blister pack so she can easily see when she forgets meds. She has missed her BP meds sev x/wk and did take today but not yest.  DMII: Diagnosed 01/20/15 with hgba1c of 6.5.    Lab Results  Component Value Date   HGBA1C 6.2 08/16/2017   HGBA1C 6.5 02/04/2017   HGBA1C 6.4 10/04/2016   CBGs:No hypoglycemic episodes.  Diet: Doesn't care what she eats - just needs to eat something but kitchen has been being completely gutted and redone for the past 6 mos so harder to eat right as much more eating out. + Exercising: went back to gym once after she got a good nights sleep from [place] and felt great but then the next night she didn't sleep at all so didn't go back.  DM Med Regimen: metformin 1g bid (takes 2 of the 500s twice a day due to cost)  EGFR: 68  Baseline Cr: 0.9 Last checked 6 mos ago when pt had acute diverticulitis flair which was caught early and managed as an outpt but Cr was 1.07 w/ eGFR 55. Microalb: Done 05/10/16 so repeat today. Microalb/cr ratio elev 600s 2 yrs ago. On losartan 25 Taking asa 81 qd.  Optho: Seen annually by Dr. Sinda Du - last exam 06/07/17 Feet: Monofilament exam done 08/2017. Denies any no problems.  Not seen by podiatry prior.  Immunizations:  Influenza: due today, done last yr  Pneumovax-23: 05/31/16  HLD:  LDL 67,  non-HDL 110.  Last levels done 02/2017  - were improving from prior on atorvastatin 80 and bid fish oil.  HTN:  On atenolol-chlorthalidone 50-25, losartan 25, lasix 20  H/o tobacco use: quite 02/23/17 by using Chantix for ~2 mos then just started forgetting to take it.  OSA: Using the cpap everynight but waking up with dry mouth despite nasal mask use - no  heated humidifier. So thinks she is breathing through her mouth.  Insomnia: Goes to bed at 10 and sleeps for an hour but then up to 7-8 am before geta another hr or 2. Tried melatonin and tylenol pm and zolpidem - found 3 of them in the house from prior and did have 3 great nights but worried she will get addicted/dependent on it. Sxs just started sev mos ago despite no changes to blame it on. Is haivng more vivid dreams which is new. After she sleeps for an hour she will have had a prolonged detailed dream - not scary or bad. But didn't have that before. Denies [facility], DOE, PND. Did wonder if she is depressed but thinks she is become depressed because she is so tried and no anhedonia. Did try some trazodone that I had rx'd her years ago and didn't work at all. Falls alseep very quickly after getting ready for bed and turning on cpap and goes to sleep - no LED/pixels/screens in rooms.  Did sleep really well on the zolpidem but never would want to become addicted/dependent on it.   Medical History Past Medical History:  Diagnosis Date  . Aortoiliac occlusive disease (HCC) 12/03/2016  . Arthritis   . Diabetes mellitus without complication (HCC)    per pt she  is pre- diabetic  . Diverticulitis   . Hypertension   . Osteoporosis   . Sleep apnea    Past Surgical History:  Procedure Laterality Date  . ABDOMINAL AORTOGRAM W/LOWER EXTREMITY N/A 12/03/2016   Procedure: Abdominal Aortogram w/Lower Extremity;  Surgeon: Chuck Hint, MD;  Location: [facility] INVASIVE CV LAB;  Service: Cardiovascular;  Laterality: N/A;  . DILATATION & CURETTAGE/HYSTEROSCOPY WITH MYOSURE N/A 12/27/2015   Procedure: DILATATION & CURETTAGE/HYSTEROSCOPY;  Surgeon: Patton Salles, MD;  Location: [facility] ORS;  Service: Gynecology;  Laterality: N/A;  . DILATION AND CURETTAGE OF UTERUS  11/2015  . ELBOW SURGERY Left ~2007   tendon repair by Dr. Ranell Patrick  . EYE SURGERY Bilateral 2008   lasik  . lower aortic bypass  2000    per patient  . PERIPHERAL VASCULAR INTERVENTION  12/03/2016   Procedure: Peripheral Vascular Intervention;  Surgeon: Chuck Hint, MD;  Location: [facility] INVASIVE CV LAB;  Service: Cardiovascular;;  . TOTAL HIP ARTHROPLASTY Right 07/27/2014   Procedure: RIGHT TOTAL HIP ARTHROPLASTY ANTERIOR APPROACH;  Surgeon: Durene Romans, MD;  Location: [facility] ORS;  Service: Orthopedics;  Laterality: Right;   Current Outpatient Medications on File Prior to Visit  Medication Sig Dispense Refill  . aspirin 81 MG chewable tablet Chew 81 mg by mouth daily.    Marland Kitchen atenolol-chlorthalidone (TENORETIC) 50-25 MG tablet TAKE 1 TABLET IN THE MORNING. 90 tablet 3  . atorvastatin (LIPITOR) 80 MG tablet Take 1 tablet (80 mg total) by mouth daily at 6 PM. 90 tablet 3  . blood glucose meter kit and supplies Dispense based on patient and insurance preference. Use as directed once a day. (DX: R73.9) 1 each 0  . ciprofloxacin (CIPRO) 500 MG tablet Take 1 tablet (500 mg total) by mouth 2 (two) times daily. 20 tablet 0  . furosemide (LASIX) 20 MG tablet TAKE 1 TABLET IN THE MORNING. 60 tablet 0  . glucose blood (GLUCOSE METER TEST) test strip Use as instructed once a day. (DX:R73.9) 50 each 2  . Lancets MISC Use as directed once a day. (DX: R73.9) 100 each 2  . losartan (COZAAR) 25 MG tablet TAKE 1 TABLET IN THE P.M. 90 tablet 3  . metFORMIN (GLUCOPHAGE) 1000 MG tablet Take 1 tablet (1,000 mg total) by mouth 2 (two) times daily with a meal. 180 tablet 3  . metFORMIN (GLUCOPHAGE) 500 MG tablet TAKE 2 TABLETS TWICE DAILY WITH MEALS. 360 tablet 3  . metroNIDAZOLE (FLAGYL) 500 MG tablet Take 1 tablet (500 mg total) by mouth 3 (three) times daily. 30 tablet 0  . Omega-3 Fatty Acids (OMEGA 3 PO) Take 1 capsule by mouth 2 (two) times daily.    . ondansetron (ZOFRAN) 4 MG tablet Take 1 tablet (4 mg total) by mouth every 8 (eight) hours as needed for nausea or vomiting. 20 tablet 0   No current facility-administered medications on file  prior to visit.    Allergies  Allergen Reactions  . Codeine Itching   Family History  Problem Relation Age of Onset  . Cancer Father 15       Dec age 38 with pancreatic Ca  . Migraines Mother   . Thyroid disease Mother        hypothyroid   Social History   Socioeconomic History  . Marital status: Married    Spouse name: Not on file  . Number of children: Not on file  . Years of education: Not on file  . Highest education level:  Not on file  Occupational History  . Occupation: Tree surgeon  Social Needs  . Financial resource strain: Not on file  . Food insecurity:    Worry: Not on file    Inability: Not on file  . Transportation needs:    Medical: Not on file    Non-medical: Not on file  Tobacco Use  . Smoking status: Former Smoker    Years: 40.00    Types: Cigarettes    Last attempt to quit: 02/23/2017    Years since quitting: 1.0  . Smokeless tobacco: Never Used  Substance and Sexual Activity  . Alcohol use: Yes    Alcohol/week: 0.0 standard drinks    Comment: very rare--1/month  . Drug use: No  . Sexual activity: Yes    Partners: Male    Birth control/protection: Post-menopausal  Lifestyle  . Physical activity:    Days per week: Not on file    Minutes per session: Not on file  . Stress: Not on file  Relationships  . Social connections:    Talks on phone: Not on file    Gets together: Not on file    Attends religious service: Not on file    Active member of club or organization: Not on file    Attends meetings of clubs or organizations: Not on file    Relationship status: Not on file  Other Topics Concern  . Not on file  Social History Narrative   Married. Patient does not exercise. She has a Geographical information systems officer.   Depression screen [facility] 2/9 03/21/2018 09/23/2017 09/20/2017 08/16/2017 02/04/2017  Decreased Interest 0 0 0 0 0  Down, Depressed, Hopeless 0 0 0 0 0  PHQ - 2 Score 0 0 0 0 0  Altered sleeping - - - - -  Tired, decreased energy - - - - -    Change in appetite - - - - -  Feeling bad or failure about yourself  - - - - -  Trouble concentrating - - - - -  Moving slowly or fidgety/restless - - - - -  Suicidal thoughts - - - - -  PHQ-9 Score - - - - -  Difficult doing work/chores - - - - -    Review of Systems  Constitutional: Negative for chills, fever and weight loss.  Gastrointestinal: Negative for nausea and vomiting.  Neurological: Negative for tingling, tremors and sensory change.  Endo/Heme/Allergies: Negative for polydipsia.   As noted in HPI  Objective:  BP (!) 143/73 (BP Location: Right Arm, Patient Position: Sitting, Cuff Size: Large)   Pulse 66   Temp 98.7 F (37.1 C) (Oral)   Resp 16   Ht 4\' 11"  (1.499 m)   Wt 189 lb 3.2 oz (85.8 kg)   LMP 05/07/2005 (Approximate)   SpO2 95%   BMI 38.21 kg/m  Physical Exam  Constitutional: She is oriented to person, place, and time. She appears well-developed and well-nourished. No distress.  HENT:  Head: Normocephalic and atraumatic.  Right Ear: External ear normal.  Left Ear: External ear normal.  Eyes: Conjunctivae are normal. No scleral icterus.  Neck: Normal range of motion. Neck supple. No thyromegaly present.  Cardiovascular: Normal rate, regular rhythm, normal heart sounds and intact distal pulses.  Pulmonary/Chest: Effort normal and breath sounds normal. No respiratory distress.  Musculoskeletal: She exhibits no edema.  Lymphadenopathy:    She has no cervical adenopathy.  Neurological: She is alert and oriented to person, place, and time.  Skin: Skin  is warm and dry. She is not diaphoretic. No erythema.  Psychiatric: She has a normal mood and affect. Her behavior is normal.   Repeat BP 130/72 POC TESTING Office Visit on 03/21/2018  Component Date Value Ref Range Status  . Hemoglobin A1C 03/21/2018 6.5* 4.0 - 5.6 % Final     Assessment & Plan:   1. Type 2 diabetes mellitus with hyperglycemia, without long-term current use of insulin (HCC) - at goal  on metformin 500 2 tabs bid (pt preference due to cost) - cont and work on tlc  2. Essential hypertension, benign - cont atenolol-chlorthalidone 50-25, losartan 25 qhs, and lasix 20 qam. Start chekcing bp outside office sev x/mo  3. Osteoporosis without current pathological fracture, unspecified osteoporosis type   4. Morbid obesity (HCC)   5. Mixed hyperlipidemia - cont atorvastatin 80  6. Obstructive sleep apnea - very compliant w/ cpap  7. Centrilobular emphysema (HCC)   8. Encounter for long-term current use of high risk medication   9. Medication monitoring encounter   10. Insomnia due to medical condition - start short term trial of zolpidem which she did very well on prior but not more than 1 refill max before wean off as pt does not want to become dependent/addicted - if still not sleeping, try switching to belsomra   Recheck in 3 mos after trial of ambien, poss belsomra and repeat sleep study to ensure all insomnia issues resolved - if she is doing great, ok to cancel that and reschedule for usu DM recheck in 6 mos   Ok to refill atenolol-chlorthaidone, lipitor, metformin, losartan x 90d supply when requested ~ April 2020 ot get to visit due ~ May  Updating tdap  Pt will start on medicare in 3 mos likely - will need [facility] visit.  Patient will continue on current chronic medications other than changes noted above, so ok to refill when needed.   See after visit summary for patient specific instructions.  Orders Placed This Encounter  Procedures  . Flu Vaccine QUAD 36+ mos IM  . Tdap vaccine greater than or equal to 7yo IM  . Comprehensive metabolic panel    Order Specific Question:   Has the patient fasted?    Answer:   Yes  . Lipid panel    Order Specific Question:   Has the patient fasted?    Answer:   Yes  . Microalbumin / creatinine urine ratio  . TSH  . Vitamin B12  . Ambulatory referral to Sleep Studies    Referral Priority:   Routine    Referral Type:   Consultation     Referral Reason:   Specialty Services Required    Number of Visits Requested:   1  . POCT glycosylated hemoglobin (Hb A1C)    Meds ordered this encounter  Medications  . zolpidem (AMBIEN) 5 MG tablet    Sig: Take 1 tablet (5 mg total) by mouth at bedtime as needed for sleep.    Dispense:  30 tablet    Refill:  0  . furosemide (LASIX) 20 MG tablet    Sig: Take 1 tablet (20 mg total) by mouth every morning.    Dispense:  90 tablet    Refill:  1    Patient verbalized to me that they understand the following: diagnosis, what is being done for them, what to expect and what should be done at home.  Their questions have been answered. They understand that I am unable to predict  every possible medication interaction or adverse outcome and that if any unexpected symptoms arise, they should contact us and their pharmacist, as well as never hesitate to seek urgent/emergent care at [facility] or ER if they think it might be warranted.    Over 40 min spent in face-to-face evaluation of and consultation with patient and coordination of care.  Over 50% of this time was spent counseling this patient regarding poss treatment of sleep hygeine, needs for OSA reassessment, med options for new onset worsening insomnia.   Norberto Sorenson, MD, MPH Primary [facility] at [facility] [address] [place], [place]  [address] [phone] Office phone  [phone] Office fax  03/21/18 1:21 AM

## 2018-03-21 NOTE — Patient Instructions (Addendum)
Start 30 min of sleep hygeine before bed and sev wk trial of ambien. after trial of ambien, if sxs return as soon as we wean your off, call for a rx for belsomra, and then recheck w/ me in 3 mos.  If improvement with the repeat sleep study and short term meds to recheck sleep cycle w/ mood and sleep all sig improving, ok to cancel that and reschedule for usu recheck in 6 mos Start checking bp outside office sev x/mo  Insomnia Insomnia is a sleep disorder that makes it difficult to fall asleep or to stay asleep. Insomnia can cause tiredness (fatigue), low energy, difficulty concentrating, mood swings, and poor performance at work or school. There are three different ways to classify insomnia:  Difficulty falling asleep.  Difficulty staying asleep.  Waking up too early in the morning.  Any type of insomnia can be long-term (chronic) or short-term (acute). Both are common. Short-term insomnia usually lasts for three months or less. Chronic insomnia occurs at least three times a week for longer than three months. What are the causes? Insomnia may be caused by another condition, situation, or substance, such as:  Anxiety.  Certain medicines.  Gastroesophageal reflux disease (GERD) or other gastrointestinal conditions.  Asthma or other breathing conditions.  Restless legs syndrome, sleep apnea, or other sleep disorders.  Chronic pain.  Menopause. This may include hot flashes.  Stroke.  Abuse of alcohol, tobacco, or illegal drugs.  Depression.  Caffeine.  Neurological disorders, such as Alzheimer disease.  An overactive thyroid (hyperthyroidism).  The cause of insomnia may not be known. What increases the risk? Risk factors for insomnia include:  Gender. Women are more commonly affected than men.  Age. Insomnia is more common as you get older.  Stress. This may involve your professional or personal life.  Income. Insomnia is more common in people with lower  income.  Lack of exercise.  Irregular work schedule or night shifts.  Traveling between different time zones.  What are the signs or symptoms? If you have insomnia, trouble falling asleep or trouble staying asleep is the main symptom. This may lead to other symptoms, such as:  Feeling fatigued.  Feeling nervous about going to sleep.  Not feeling rested in the morning.  Having trouble concentrating.  Feeling irritable, anxious, or depressed.  How is this treated? Treatment for insomnia depends on the cause. If your insomnia is caused by an underlying condition, treatment will focus on addressing the condition. Treatment may also include:  Medicines to help you sleep.  Counseling or therapy.  Lifestyle adjustments.  Follow these instructions at home:  Take medicines only as directed by your health care provider.  Keep regular sleeping and waking hours. Avoid naps.  Keep a sleep diary to help you and your health care provider figure out what could be causing your insomnia. Include: ? When you sleep. ? When you wake up during the night. ? How well you sleep. ? How rested you feel the next day. ? Any side effects of medicines you are taking. ? What you eat and drink.  Make your bedroom a comfortable place where it is easy to fall asleep: ? Put up shades or special blackout curtains to block light from outside. ? Use a white noise machine to block noise. ? Keep the temperature cool.  Exercise regularly as directed by your health care provider. Avoid exercising right before bedtime.  Use relaxation techniques to manage stress. Ask your health care provider to suggest  some techniques that may work well for you. These may include: ? Breathing exercises. ? Routines to release muscle tension. ? Visualizing peaceful scenes.  Cut back on alcohol, caffeinated beverages, and cigarettes, especially close to bedtime. These can disrupt your sleep.  Do not overeat or eat spicy  foods right before bedtime. This can lead to digestive discomfort that can make it hard for you to sleep.  Limit screen use before bedtime. This includes: ? Watching TV. ? Using your smartphone, tablet, and computer.  Stick to a routine. This can help you fall asleep faster. Try to do a quiet activity, brush your teeth, and go to bed at the same time each night.  Get out of bed if you are still awake after 15 minutes of trying to sleep. Keep the lights down, but try reading or doing a quiet activity. When you feel sleepy, go back to bed.  Make sure that you drive carefully. Avoid driving if you feel very sleepy.  Keep all follow-up appointments as directed by your health care provider. This is important. Contact a health care provider if:  You are tired throughout the day or have trouble in your daily routine due to sleepiness.  You continue to have sleep problems or your sleep problems get worse. Get help right away if:  You have serious thoughts about hurting yourself or someone else. This information is not intended to replace advice given to you by your health care provider. Make sure you discuss any questions you have with your health care provider. Document Released: 04/20/2000 Document Revised: 09/23/2015 Document Reviewed: 01/22/2014 Elsevier Interactive Patient Education  2018 [employer].   Tdap Vaccine (Tetanus, Diphtheria and Pertussis): What You Need to Know 1. Why get vaccinated? Tetanus, diphtheria and pertussis are very serious diseases. Tdap vaccine can protect [place] from these diseases. And, Tdap vaccine given to pregnant women can protect newborn babies against pertussis. TETANUS (Lockjaw) is rare in the [place] today. It causes painful muscle tightening and stiffness, usually all over the body.  It can lead to tightening of muscles in the head and neck so you can't open your mouth, swallow, or sometimes even breathe. Tetanus kills about 1 out of 10 people who  are infected even after receiving the best medical care.  DIPHTHERIA is also rare in the [place] today. It can cause a thick coating to form in the back of the throat.  It can lead to breathing problems, heart failure, paralysis, and death.  PERTUSSIS (Whooping Cough) causes severe coughing spells, which can cause difficulty breathing, vomiting and disturbed sleep.  It can also lead to weight loss, incontinence, and rib fractures. Up to 2 in 100 adolescents and 5 in 100 adults with pertussis are hospitalized or have complications, which could include pneumonia or death.  These diseases are caused by bacteria. Diphtheria and pertussis are spread from person to person through secretions from coughing or sneezing. Tetanus enters the body through cuts, scratches, or wounds. Before vaccines, as many as 200,000 cases of diphtheria, 200,000 cases of pertussis, and hundreds of cases of tetanus, were reported in the [place] each year. Since vaccination began, reports of cases for tetanus and diphtheria have dropped by about 99% and for pertussis by about 80%. 2. Tdap vaccine Tdap vaccine can protect adolescents and adults from tetanus, diphtheria, and pertussis. One dose of Tdap is routinely given at age 58 or 61. People who did not get Tdap at that age should get it as  soon as possible. Tdap is especially important for healthcare professionals and anyone having close contact with a baby younger than 12 months. Pregnant women should get a dose of Tdap during every pregnancy, to protect the newborn from pertussis. Infants are most at risk for severe, life-threatening complications from pertussis. Another vaccine, called Td, protects against tetanus and diphtheria, but not pertussis. A Td booster should be given every 10 years. Tdap may be given as one of these boosters if you have never gotten Tdap before. Tdap may also be given after a severe cut or burn to prevent tetanus infection. Your doctor  or the person giving you the vaccine can give you more information. Tdap may safely be given at the same time as other vaccines. 3. Some people should not get this vaccine  A person who has ever had a life-threatening allergic reaction after a previous dose of any diphtheria, tetanus or pertussis containing vaccine, OR has a severe allergy to any part of this vaccine, should not get Tdap vaccine. Tell the person giving the vaccine about any severe allergies.  Anyone who had coma or long repeated seizures within 7 days after a childhood dose of DTP or DTaP, or a previous dose of Tdap, should not get Tdap, unless a cause other than the vaccine was found. They can still get Td.  Talk to your doctor if you: ? have seizures or another nervous system problem, ? had severe pain or swelling after any vaccine containing diphtheria, tetanus or pertussis, ? ever had a condition called Guillain-Barr Syndrome (GBS), ? aren't feeling well on the day the shot is scheduled. 4. Risks With any medicine, including vaccines, there is a chance of side effects. These are usually mild and go away on their own. Serious reactions are also possible but are rare. Most people who get Tdap vaccine do not have any problems with it. Mild problems following Tdap: (Did not interfere with activities)  Pain where the shot was given (about 3 in 4 adolescents or 2 in 3 adults)  Redness or swelling where the shot was given (about 1 person in 5)  Mild fever of at least 100.79F (up to about 1 in 25 adolescents or 1 in 100 adults)  Headache (about 3 or 4 people in 10)  Tiredness (about 1 person in 3 or 4)  Nausea, vomiting, diarrhea, stomach ache (up to 1 in 4 adolescents or 1 in 10 adults)  Chills, sore joints (about 1 person in 10)  Body aches (about 1 person in 3 or 4)  Rash, swollen glands (uncommon)  Moderate problems following Tdap: (Interfered with activities, but did not require medical attention)  Pain  where the shot was given (up to 1 in 5 or 6)  Redness or swelling where the shot was given (up to about 1 in 16 adolescents or 1 in 12 adults)  Fever over 102F (about 1 in 100 adolescents or 1 in 250 adults)  Headache (about 1 in 7 adolescents or 1 in 10 adults)  Nausea, vomiting, diarrhea, stomach ache (up to 1 or 3 people in 100)  Swelling of the entire arm where the shot was given (up to about 1 in 500).  Severe problems following Tdap: (Unable to perform usual activities; required medical attention)  Swelling, severe pain, bleeding and redness in the arm where the shot was given (rare).  Problems that could happen after any vaccine:  People sometimes faint after a medical procedure, including vaccination. Sitting or lying down  for about 15 minutes can help prevent fainting, and injuries caused by a fall. Tell your doctor if you feel dizzy, or have vision changes or ringing in the ears.  Some people get severe pain in the shoulder and have difficulty moving the arm where a shot was given. This happens very rarely.  Any medication can cause a severe allergic reaction. Such reactions from a vaccine are very rare, estimated at fewer than 1 in a million doses, and would happen within a few minutes to a few hours after the vaccination. As with any medicine, there is a very remote chance of a vaccine causing a serious injury or death. The safety of vaccines is always being monitored. For more information, visit: http://www.aguilar.org/ 5. What if there is a serious problem? What should I look for? Look for anything that concerns you, such as signs of a severe allergic reaction, very high fever, or unusual behavior. Signs of a severe allergic reaction can include hives, swelling of the face and throat, difficulty breathing, a fast heartbeat, dizziness, and weakness. These would usually start a few minutes to a few hours after the vaccination. What should I do?  If you think it is a  severe allergic reaction or other emergency that can't wait, call 9-1-1 or get the person to the nearest hospital. Otherwise, call your doctor.  Afterward, the reaction should be reported to the Vaccine Adverse Event Reporting System (VAERS). Your doctor might file this report, or you can do it yourself through the VAERS web site at www.vaers.SamedayNews.es, or by calling 8147240877. ? VAERS does not give medical advice. 6. The National Vaccine Injury Compensation Program The [employer] Vaccine Injury Compensation Program (VICP) is a federal program that was created to compensate people who may have been injured by certain vaccines. Persons who believe they may have been injured by a vaccine can learn about the program and about filing a claim by calling 415-844-6382 or visiting the [place] website at GoldCloset.com.ee. There is a time limit to file a claim for compensation. 7. How can I learn more?  Ask your doctor. He or she can give you the vaccine package insert or suggest other sources of information.  Call your local or state health department.  Contact the Centers for Disease Control and Prevention (CDC): ? Call 915 646 7472 (1-800-CDC-INFO) or ? Visit CDC's website at http://hunter.com/ CDC Tdap Vaccine VIS (06/30/13) This information is not intended to replace advice given to you by your health care provider. Make sure you discuss any questions you have with your health care provider. Document Released: 10/23/2011 Document Revised: 01/12/2016 Document Reviewed: 01/12/2016 Elsevier Interactive Patient Education  2017 [place]. Influenza (Flu) Vaccine (Inactivated or Recombinant): What You Need to Know 1. Why get vaccinated? Influenza ("flu") is a contagious disease that spreads around the [place] every year, usually between October and May. Flu is caused by influenza viruses, and is spread mainly by coughing, sneezing, and close contact. Anyone can get flu. Flu  strikes suddenly and can last several days. Symptoms vary by age, but can include:  fever/chills  sore throat  muscle aches  fatigue  cough  headache  runny or stuffy nose  Flu can also lead to pneumonia and blood infections, and cause diarrhea and seizures in children. If you have a medical condition, such as heart or lung disease, flu can make it worse. Flu is more dangerous for some people. Infants and young children, people 18 years of age and older, pregnant women, and  people with certain health conditions or a weakened immune system are at greatest risk. Each year thousands of people in the [place] die from flu, and many more are hospitalized. Flu vaccine can:  keep you from getting flu,  make flu less severe if you do get it, and  keep you from spreading flu to your family and other people. 2. Inactivated and recombinant flu vaccines A dose of flu vaccine is recommended every flu season. Children 6 months through 61 years of age may need two doses during the same flu season. Everyone else needs only one dose each flu season. Some inactivated flu vaccines contain a very small amount of a mercury-based preservative called thimerosal. Studies have not shown thimerosal in vaccines to be harmful, but flu vaccines that do not contain thimerosal are available. There is no live flu virus in flu shots. They cannot cause the flu. There are many flu viruses, and they are always changing. Each year a new flu vaccine is made to protect against three or four viruses that are likely to cause disease in the upcoming flu season. But even when the vaccine doesn't exactly match these viruses, it may still provide some protection. Flu vaccine cannot prevent:  flu that is caused by a virus not covered by the vaccine, or  illnesses that look like flu but are not.  It takes about 2 weeks for protection to develop after vaccination, and protection lasts through the flu season. 3. Some  people should not get this vaccine Tell the person who is giving you the vaccine:  If you have any severe, life-threatening allergies. If you ever had a life-threatening allergic reaction after a dose of flu vaccine, or have a severe allergy to any part of this vaccine, you may be advised not to get vaccinated. Most, but not all, types of flu vaccine contain a small amount of egg protein.  If you ever had Guillain-Barr Syndrome (also called GBS). Some people with a history of GBS should not get this vaccine. This should be discussed with your doctor.  If you are not feeling well. It is usually okay to get flu vaccine when you have a mild illness, but you might be asked to come back when you feel better.  4. Risks of a vaccine reaction With any medicine, including vaccines, there is a chance of reactions. These are usually mild and go away on their own, but serious reactions are also possible. Most people who get a flu shot do not have any problems with it. Minor problems following a flu shot include:  soreness, redness, or swelling where the shot was given  hoarseness  sore, red or itchy eyes  cough  fever  aches  headache  itching  fatigue  If these problems occur, they usually begin soon after the shot and last 1 or 2 days. More serious problems following a flu shot can include the following:  There may be a small increased risk of Guillain-Barre Syndrome (GBS) after inactivated flu vaccine. This risk has been estimated at 1 or 2 additional cases per million people vaccinated. This is much lower than the risk of severe complications from flu, which can be prevented by flu vaccine.  Young children who get the flu shot along with pneumococcal vaccine (PCV13) and/or DTaP vaccine at the same time might be slightly more likely to have a seizure caused by fever. Ask your doctor for more information. Tell your doctor if a child who is getting flu  vaccine has ever had a  seizure.  Problems that could happen after any injected vaccine:  People sometimes faint after a medical procedure, including vaccination. Sitting or lying down for about 15 minutes can help prevent fainting, and injuries caused by a fall. Tell your doctor if you feel dizzy, or have vision changes or ringing in the ears.  Some people get severe pain in the shoulder and have difficulty moving the arm where a shot was given. This happens very rarely.  Any medication can cause a severe allergic reaction. Such reactions from a vaccine are very rare, estimated at about 1 in a million doses, and would happen within a few minutes to a few hours after the vaccination. As with any medicine, there is a very remote chance of a vaccine causing a serious injury or death. The safety of vaccines is always being monitored. For more information, visit: http://www.aguilar.org/ 5. What if there is a serious reaction? What should I look for? Look for anything that concerns you, such as signs of a severe allergic reaction, very high fever, or unusual behavior. Signs of a severe allergic reaction can include hives, swelling of the face and throat, difficulty breathing, a fast heartbeat, dizziness, and weakness. These would start a few minutes to a few hours after the vaccination. What should I do?  If you think it is a severe allergic reaction or other emergency that can't wait, call 9-1-1 and get the person to the nearest hospital. Otherwise, call your doctor.  Reactions should be reported to the Vaccine Adverse Event Reporting System (VAERS). Your doctor should file this report, or you can do it yourself through the VAERS web site at www.vaers.SamedayNews.es, or by calling (385)880-1150. ? VAERS does not give medical advice. 6. The National Vaccine Injury Compensation Program The [employer] Vaccine Injury Compensation Program (VICP) is a federal program that was created to compensate people who may have been injured by  certain vaccines. Persons who believe they may have been injured by a vaccine can learn about the program and about filing a claim by calling 365-658-3014 or visiting the [place] website at GoldCloset.com.ee. There is a time limit to file a claim for compensation. 7. How can I learn more?  Ask your healthcare provider. He or she can give you the vaccine package insert or suggest other sources of information.  Call your local or state health department.  Contact the Centers for Disease Control and Prevention (CDC): ? Call (337) 263-2587 (1-800-CDC-INFO) or ? Visit CDC's website at https://gibson.com/ Vaccine Information Statement, Inactivated Influenza Vaccine (12/11/2013) This information is not intended to replace advice given to you by your health care provider. Make sure you discuss any questions you have with your health care provider. Document Released: 02/15/2006 Document Revised: 01/12/2016 Document Reviewed: 01/12/2016 Elsevier Interactive Patient Education  2017 [employer].     If you have lab work done today you will be contacted with your lab results within the next 2 weeks.  If you have not heard from [place] then please contact us. The fastest way to get your results is to register for My Chart.   IF you received an x-ray today, you will receive an invoice from [facility]. Please contact [facility] at [phone] with questions or concerns regarding your invoice.   IF you received labwork today, you will receive an invoice from [facility]. Please contact [facility] at [phone] with questions or concerns regarding your invoice.   Our billing staff will not be able to assist  you with questions regarding bills from these companies.  You will be contacted with the lab results as soon as they are available. The fastest way to get your results is to activate your My Chart account. Instructions are located on the last page of this paperwork. If you have  not heard from [place] regarding the results in 2 weeks, please contact this office.

## 2018-03-22 LAB — MICROALBUMIN / CREATININE URINE RATIO
CREATININE, UR: 29.3 mg/dL
Microalb/Creat Ratio: 62.8 mg/g creat — ABNORMAL HIGH (ref 0.0–30.0)
Microalbumin, Urine: 18.4 ug/mL

## 2018-03-22 LAB — COMPREHENSIVE METABOLIC PANEL
ALBUMIN: 4.2 g/dL (ref 3.6–4.8)
ALT: 14 IU/L (ref 0–32)
AST: 15 IU/L (ref 0–40)
Albumin/Globulin Ratio: 1.6 (ref 1.2–2.2)
Alkaline Phosphatase: 178 IU/L — ABNORMAL HIGH (ref 39–117)
BILIRUBIN TOTAL: 0.4 mg/dL (ref 0.0–1.2)
BUN / CREAT RATIO: 17 (ref 12–28)
BUN: 23 mg/dL (ref 8–27)
CHLORIDE: 93 mmol/L — AB (ref 96–106)
CO2: 26 mmol/L (ref 20–29)
CREATININE: 1.35 mg/dL — AB (ref 0.57–1.00)
Calcium: 9.6 mg/dL (ref 8.7–10.3)
GFR calc non Af Amer: 42 mL/min/{1.73_m2} — ABNORMAL LOW (ref >59)
GFR, EST AFRICAN AMERICAN: 48 mL/min/{1.73_m2} — AB (ref >59)
GLUCOSE: 141 mg/dL — AB (ref 65–99)
Globulin, Total: 2.7 g/dL (ref 1.5–4.5)
Potassium: 3.1 mmol/L — ABNORMAL LOW (ref 3.5–5.2)
Sodium: 141 mmol/L (ref 134–144)
TOTAL PROTEIN: 6.9 g/dL (ref 6.0–8.5)

## 2018-03-22 LAB — VITAMIN B12: VITAMIN B 12: 328 pg/mL (ref 232–1245)

## 2018-03-22 LAB — LIPID PANEL
CHOLESTEROL TOTAL: 181 mg/dL (ref 100–199)
Chol/HDL Ratio: 3.9 ratio (ref 0.0–4.4)
HDL: 47 mg/dL (ref >39)
LDL Calculated: 86 mg/dL (ref 0–99)
Triglycerides: 242 mg/dL — ABNORMAL HIGH (ref 0–149)
VLDL Cholesterol Cal: 48 mg/dL — ABNORMAL HIGH (ref 5–40)

## 2018-03-22 LAB — TSH: TSH: 4.01 u[IU]/mL (ref 0.450–4.500)

## 2018-03-23 ENCOUNTER — Encounter: Payer: Self-pay | Admitting: Family Medicine

## 2018-03-23 MED ORDER — POTASSIUM CHLORIDE CRYS ER 20 MEQ PO TBCR: 20.0000 meq | EXTENDED_RELEASE_TABLET | Freq: Every day | ORAL | 0 refills | Status: DC

## 2018-03-23 NOTE — Addendum Note (Signed)
Addended by: Shawnee Knapp on: 03/23/2018 03:01 PM   Modules accepted: Orders

## 2018-04-22 ENCOUNTER — Encounter: Payer: Self-pay | Admitting: Family Medicine

## 2018-04-24 NOTE — Telephone Encounter (Signed)
Copied from [place] [id number]. Topic: General - Other >> Apr 24, 2018  4:15 PM Percell Belt A wrote: Reason for CRM:   Pt husband called in to fu on my chart message on 12/17.  Please advise   Best number [phone]

## 2018-04-24 NOTE — Telephone Encounter (Signed)
Copied from [place] [id number]. Topic: General - Other >> Apr 24, 2018  4:15 PM Percell Belt A wrote: Reason for CRM:   Pt husband called in to fu on my chart message on 12/17.  Please advise   Best number [phone]

## 2018-05-03 ENCOUNTER — Encounter: Payer: Self-pay | Admitting: Family Medicine

## 2018-05-22 ENCOUNTER — Encounter: Payer: Self-pay | Admitting: Neurology

## 2018-05-22 ENCOUNTER — Ambulatory Visit: Payer: 59 | Admitting: Neurology

## 2018-05-22 VITALS — BP 132/57 | HR 66 | Ht 58.5 in | Wt 185.0 lb

## 2018-05-22 DIAGNOSIS — E669 Obesity, unspecified: Secondary | ICD-10-CM | POA: Diagnosis not present

## 2018-05-22 DIAGNOSIS — G4733 Obstructive sleep apnea (adult) (pediatric): Secondary | ICD-10-CM | POA: Diagnosis not present

## 2018-05-22 DIAGNOSIS — Z9989 Dependence on other enabling machines and devices: Secondary | ICD-10-CM

## 2018-05-22 DIAGNOSIS — G479 Sleep disorder, unspecified: Secondary | ICD-10-CM | POA: Diagnosis not present

## 2018-05-22 DIAGNOSIS — R351 Nocturia: Secondary | ICD-10-CM | POA: Diagnosis not present

## 2018-05-22 DIAGNOSIS — G4700 Insomnia, unspecified: Secondary | ICD-10-CM

## 2018-05-22 NOTE — Patient Instructions (Addendum)
Thank you for choosing [facility] for your sleep related care! It was nice to meet you today! I appreciate that you entrust me with your sleep related healthcare concerns. I hope, I was able to address at least some of your concerns today, and that I can help you feel reassured and also get better.    Here is what we discussed today and what we came up with as our plan for you:    Based on your symptoms and your exam I believe you are still at risk for obstructive sleep apnea and would benefit from reevaluation as it has been many years and you need new supplies and an updated machine. Therefore, I think we should proceed with a sleep study to determine how severe your sleep apnea is. If you have more than mild OSA, I want you to consider ongoing treatment with CPAP. Please remember, the risks and ramifications of moderate to severe obstructive sleep apnea or OSA are: Cardiovascular disease, including congestive heart failure, stroke, difficult to control hypertension, arrhythmias, and even type 2 diabetes has been linked to untreated OSA. Sleep apnea causes disruption of sleep and sleep deprivation in most cases, which, in turn, can cause recurrent headaches, problems with memory, mood, concentration, focus, and vigilance. Most people with untreated sleep apnea report excessive daytime sleepiness, which can affect their ability to drive. Please do not drive if you feel sleepy.   I will likely see you back after your sleep study to go over the test results and where to go from there. We will call you after your sleep study to advise about the results (most likely, you will hear from Kristen, my nurse) and to set up an appointment at the time, as necessary.    Our sleep lab administrative assistant will call you to schedule your sleep study. If you don't hear back from her by about 2 weeks from now, please feel free to call her at [phone]. You can leave a message with your phone  number and concerns, if you get the voicemail box. She will call back as soon as possible.     

## 2018-05-22 NOTE — Progress Notes (Signed)
Subjective:    Patient ID: Diane Giles is a 63 y.o. female.  HPI     Star Age, MD, PhD [facility] [address] [place], [place] [address]  Dear Dr. Brigitte Pulse,  I saw your patient, Diane Giles, upon your kind request in my clinic today for initial consultation of her sleep disorder, in particular, reevaluation of her prior diagnosis of OSA. The patient is unaccompanied today. As you know, [name] is a 62 year old right-handed woman with an underlying medical history of prediabetes, arthritis, hypertension, diverticulitis, osteoporosis and obesity, who was previously diagnosed with obstructive sleep apnea and placed on AutoPap therapy. Prior sleep study results are not available for my review today. She had sleep study testing per her estimate about 15 years ago. She has been using [facility] as her DME. She does not sleep very well. I reviewed your office note from 03/21/2018. I was able to review her compliance data from her machine which she brought today: 04/22/2018 through 05/21/2018, which is a total of 30 days, during which time she used her machine every night with percent used days greater than 4 hours at 100%, indicating superb compliance but the high average usage of 10 hours and 14 minutes, residual AHI at goal at 1 per hour, 95th percentile pressure at 12.3 cm, leak acceptable with the 95th percentile at 12.8 L/m on a pressure range of 10 cm to 16 cm. She reports her one son lives with them. No FHx of OSA, was told she has severe OSA. She has had difficulty maintaining sleep for the past 6 months. Has tried OTC meds, including Benadryl and melatonin (no help, but does take the occasional Benadryl).  She is married and lives with her husband, they have 5 children, one son currently lives with them. She quit smoking in October 2018. She drinks alcohol infrequently and caffeine in the form of coffee but also infrequently. Her Epworth  sleepiness score is 5 out of 24 today, fatigue score is 17 out of 63. She has a prescription for generic Ambien 5 mg strength as needed. She tried it and reports, it helped only a little, no longer on it. She may try Belsomra next. She reports lifelong history of insomnia, nevertheless, it looks like she did try trazodone in 2016. She reports that she tried it only for a few days. She reports no depression, anxiety or stress. She is retired. She tries to stay active. Her husband sometimes watches TV in the bedroom but she does not usually watch it. They have no pets. She does admit that she spends a long time in bed on the CPAP although she also says that when she is not able to sleep she will get out of bed and go downstairs. She reports no significant difficulty falling asleep but she will wake up after an hour, she does not believe she sleeps more than 2-3 hours at a time. Sometimes she may not go back to sleep, sometimes she feels she only dozes off and on.  Denies telltale symptoms of restless leg syndrome. She has no pain at night. She denies morning headaches but has nocturia about one or 2 or sometimes 3 times per night.  Her Past Medical History Is Significant For: Past Medical History:  Diagnosis Date  . Aortoiliac occlusive disease ([place]) 12/03/2016  . Arthritis   . Diabetes mellitus without complication ([place])    per pt she is pre- diabetic  . Diverticulitis   .  Hypertension   . Osteoporosis   . Sleep apnea   . Tobacco use disorder 07/04/2012    Her Past Surgical History Is Significant For: Past Surgical History:  Procedure Laterality Date  . ABDOMINAL AORTOGRAM W/LOWER EXTREMITY N/A 12/03/2016   Procedure: Abdominal Aortogram w/Lower Extremity;  Surgeon: Angelia Mould, MD;  Location: [facility];  Service: Cardiovascular;  Laterality: N/A;  . DILATATION & CURETTAGE/HYSTEROSCOPY WITH MYOSURE N/A 12/27/2015   Procedure: DILATATION & CURETTAGE/HYSTEROSCOPY;  Surgeon: Nunzio Cobbs, MD;  Location: [facility];  Service: Gynecology;  Laterality: N/A;  . DILATION AND CURETTAGE OF UTERUS  11/2015  . ELBOW SURGERY Left ~2007   tendon repair by Dr. Veverly Fells  . EYE SURGERY Bilateral 2008   lasik  . lower aortic bypass  2000   per patient  . PERIPHERAL VASCULAR INTERVENTION  12/03/2016   Procedure: Peripheral Vascular Intervention;  Surgeon: Angelia Mould, MD;  Location: [facility];  Service: Cardiovascular;;  . TOTAL HIP ARTHROPLASTY Right 07/27/2014   Procedure: RIGHT TOTAL HIP ARTHROPLASTY ANTERIOR APPROACH;  Surgeon: Paralee Cancel, MD;  Location: [facility];  Service: Orthopedics;  Laterality: Right;    Her Family History Is Significant For: Family History  Problem Relation Age of Onset  . Cancer Father 64       Dec age 48 with pancreatic Ca  . Migraines Mother   . Thyroid disease Mother        hypothyroid    Her Social History Is Significant For: Social History   Socioeconomic History  . Marital status: Married    Spouse name: Not on file  . Number of children: Not on file  . Years of education: Not on file  . Highest education level: Not on file  Occupational History  . Occupation: Art gallery manager  Social Needs  . Financial resource strain: Not on file  . Food insecurity:    Worry: Not on file    Inability: Not on file  . Transportation needs:    Medical: Not on file    Non-medical: Not on file  Tobacco Use  . Smoking status: Former Smoker    Years: 40.00    Types: Cigarettes    Last attempt to quit: 02/23/2017    Years since quitting: 1.2  . Smokeless tobacco: Never Used  Substance and Sexual Activity  . Alcohol use: Yes    Alcohol/week: 0.0 standard drinks    Comment: very rare--1/month  . Drug use: No  . Sexual activity: Yes    Partners: Male    Birth control/protection: Post-menopausal  Lifestyle  . Physical activity:    Days per week: Not on file    Minutes per session: Not on file  . Stress: Not on file   Relationships  . Social connections:    Talks on phone: Not on file    Gets together: Not on file    Attends religious service: Not on file    Active member of club or organization: Not on file    Attends meetings of clubs or organizations: Not on file    Relationship status: Not on file  Other Topics Concern  . Not on file  Social History Narrative   Married. Patient does not exercise. She has a Gaffer.    Her Allergies Are:  Allergies  Allergen Reactions  . Codeine Itching  :   Her Current Medications Are:  Outpatient Encounter Medications as of 05/22/2018  Medication Sig  .  aspirin 81 MG chewable tablet Chew 81 mg by mouth daily.  Marland Kitchen atenolol-chlorthalidone (TENORETIC) 50-25 MG tablet TAKE 1 TABLET IN THE MORNING.  Marland Kitchen atorvastatin (LIPITOR) 80 MG tablet Take 1 tablet (80 mg total) by mouth daily at 6 PM.  . furosemide (LASIX) 20 MG tablet Take 1 tablet (20 mg total) by mouth every morning.  . Lancets MISC Use as directed once a day. (DX: R73.9)  . losartan (COZAAR) 25 MG tablet TAKE 1 TABLET IN THE P.M.  . metFORMIN (GLUCOPHAGE) 500 MG tablet TAKE 2 TABLETS TWICE DAILY WITH MEALS.  Marland Kitchen Omega-3 Fatty Acids (OMEGA 3 PO) Take 1 capsule by mouth 2 (two) times daily.  . [DISCONTINUED] zolpidem (AMBIEN) 5 MG tablet Take 1 tablet (5 mg total) by mouth at bedtime as needed for sleep.  . [DISCONTINUED] potassium chloride SA (K-DUR,KLOR-CON) 20 MEQ tablet Take 1 tablet (20 mEq total) by mouth daily for 4 days. Stop furosemide until otherwise instructed.   No facility-administered encounter medications on file as of 05/22/2018.   :  Review of Systems:  Out of a complete 14 point review of systems, all are reviewed and negative with the exception of these symptoms as listed below: Review of Systems  Neurological:       Pt presents today to discuss her sleep. Pt had her sleep study about 15 years ago. Pt's current cpap is about 67-41 years old. Pt does not think she has ever been  under the care of a sleep MD. Pt uses [facility] as her DME. Pt reports that she is not sleeping well.  Epworth Sleepiness Scale 0= would never doze 1= slight chance of dozing 2= moderate chance of dozing 3= high chance of dozing  Sitting and reading: 0 Watching TV: 0 Sitting inactive in a public place (ex. Theater or meeting): 0 As a passenger in a car for an hour without a break: 2 Lying down to rest in the afternoon: 3 Sitting and talking to someone: 0 Sitting quietly after lunch (no alcohol): 0 In a car, while stopped in traffic: 0 Total: 5     Objective:  Neurological Exam  Physical Exam Physical Examination:   Vitals:   05/22/18 0900  BP: (!) 132/57  Pulse: 66    General Examination: The patient is a very pleasant 63 y.o. female in no acute distress. She appears well-developed and well-nourished and well groomed.   HEENT: Normocephalic, atraumatic, pupils are equal, round and reactive to light and accommodation. Extraocular tracking is good without limitation to gaze excursion or nystagmus noted. Normal smooth pursuit is noted. Hearing is grossly intact. Face is symmetric with normal facial animation and normal facial sensation. Speech is clear with no dysarthria noted. There is no hypophonia. There is no lip, neck/head, jaw or voice tremor. Neck is supple with full range of passive and active motion. There are no carotid bruits on auscultation. Oropharynx exam reveals: moderate mouth dryness, adequate dental hygiene with full dentures in place and moderate airway crowding secondary to smaller airway entry and redundant soft palate, tonsils appear to be small. Mallampati is class III. Neck circumference is 15-5/8 inches. Tongue protrudes centrally and palate elevates symmetrically.   Chest: Clear to auscultation without wheezing, rhonchi or crackles noted.  Heart: S1+S2+0, regular and normal without murmurs, rubs or gallops noted.   Abdomen: Soft, non-tender and  non-distended with normal bowel sounds appreciated on auscultation.  Extremities: There is 1+ pitting edema in the distal lower extremities bilaterally, primarily around the ankles.Marland Kitchen  Skin: Warm and dry without trophic changes noted.  Musculoskeletal: exam reveals no obvious joint deformities, tenderness or joint swelling or erythema.   Neurologically:  Mental status: The patient is awake, alert and oriented in all 4 spheres. Her immediate and remote memory, attention, language skills and fund of knowledge are appropriate. There is no evidence of aphasia, agnosia, apraxia or anomia. Speech is clear with normal prosody and enunciation. Thought process is linear. Mood is normal and affect is normal.  Cranial nerves II - XII are as described above under HEENT exam. In addition: shoulder shrug is normal with equal shoulder height noted. Motor exam: Normal bulk, strength and tone is noted. There is no drift, tremor or rebound. Romberg is negative. Fine motor skills and coordination: grossly intact.  Cerebellar testing: No dysmetria or intention tremor on finger to nose testing. Heel to shin is unremarkable bilaterally. There is no truncal or gait ataxia.  Sensory exam: intact to light touch in the upper and lower extremities.  Gait, station and balance: She stands without difficulty. No veering to one side is noted. No leaning to one side is noted. Posture is age-appropriate and stance is narrow based. Gait shows normal stride length and pace. Tandem walk is unremarkable.   Assessment and Plan:   In summary, Daleen C Fannin is a very pleasant 63 y.o.-year old female with an underlying medical history of prediabetes, arthritis, hypertension, diverticulitis, osteoporosis and obesity, who Presents for evaluation of her sleep disorder including her prior diagnosis of OSA. She has been on AutoPap therapy. She was would benefit from reevaluation to update the diagnosis and also to potentially optimize  treatment settings. She is compliant with her AutoPap machine and is highly commended for this. Nevertheless, she has significant trouble maintaining sleep. She does use her AutoPap quite a long time with an average usage of over 10 hours over the past 30 days. She may spend a long time in bed and it may not always be fruitful. She is advised regarding good sleep hygiene. She is agreeable to coming in for sleep study testing, she will hopefully qualify for new equipment after that. We talked about my findings and the diagnosis of OSA, its prognosis and treatment options. We talked about medical treatments, surgical interventions and non-pharmacological approaches. I explained in particular the risks and ramifications of untreated moderate to severe OSA, especially with respect to developing cardiovascular disease down the Road, including congestive heart failure, difficult to Giles hypertension, cardiac arrhythmias, or stroke. Even type 2 diabetes has, in part, been linked to untreated OSA. Symptoms of untreated OSA include daytime sleepiness, memory problems, mood irritability and mood disorder such as depression and anxiety, lack of energy, as well as recurrent headaches, especially morning headaches. We talked about trying to maintain a healthy lifestyle in general, as well as the importance of weight control. I encouraged the patient to eat healthy, exercise daily and keep well hydrated, to keep a scheduled bedtime and wake time routine, to not skip any meals and eat healthy snacks in between meals. I advised the patient not to drive when feeling sleepy. I plan to see her back after the sleep study is completed and we will also keep her posted with a phone call after the study is done.  I answered all her questions today and the patient was in agreement.  Thank you very much for allowing me to participate in the care of this nice patient. If I can be of any further assistance to  you please do not hesitate to  call me at [phone].  Sincerely,   Star Age, MD, PhD

## 2018-06-11 ENCOUNTER — Ambulatory Visit (INDEPENDENT_AMBULATORY_CARE_PROVIDER_SITE_OTHER): Payer: Medicare Other | Admitting: Neurology

## 2018-06-11 DIAGNOSIS — G479 Sleep disorder, unspecified: Secondary | ICD-10-CM

## 2018-06-11 DIAGNOSIS — G4733 Obstructive sleep apnea (adult) (pediatric): Secondary | ICD-10-CM

## 2018-06-11 DIAGNOSIS — R351 Nocturia: Secondary | ICD-10-CM

## 2018-06-11 DIAGNOSIS — Z9989 Dependence on other enabling machines and devices: Secondary | ICD-10-CM

## 2018-06-11 DIAGNOSIS — G4700 Insomnia, unspecified: Secondary | ICD-10-CM

## 2018-06-11 DIAGNOSIS — E669 Obesity, unspecified: Secondary | ICD-10-CM

## 2018-06-17 ENCOUNTER — Telehealth: Payer: Self-pay

## 2018-06-17 NOTE — Telephone Encounter (Signed)
-----   Message from Star Age, MD sent at 06/17/2018  8:17 AM EST ----- Patient referred by Dr. Brigitte Pulse, seen by me on 05/22/18, HST on 06/11/18.    Please call and notify the patient that the recent home sleep test confirmed her OSA, in the moderate range. I will prescribe a new autoPAP and new supplies. She will need a FU in 10 weeks or so, as per ins requirement. We can stay with [facility] if she likes. Please arrange that with me or one of our NPs. Thanks,   Star Age, MD, PhD [facility])

## 2018-06-17 NOTE — Progress Notes (Signed)
Patient referred by Dr. Brigitte Pulse, seen by me on 05/22/18, HST on 06/11/18.    Please call and notify the patient that the recent home sleep test confirmed her OSA, in the moderate range. I will prescribe a new autoPAP and new supplies. She will need a FU in 10 weeks or so, as per ins requirement. We can stay with [facility] if she likes. Please arrange that with me or one of our NPs. Thanks,   Star Age, MD, PhD [facility])

## 2018-06-17 NOTE — Telephone Encounter (Signed)
I called pt. I advised pt that Dr. Rexene Alberts reviewed their sleep study results and found that pt has moderate osa. Dr. Rexene Alberts recommends that pt start a new auto pap at home. I reviewed PAP compliance expectations with the pt. Pt is agreeable to starting an auto-PAP. I advised pt that an order will be sent to a DME, [facility], and [facility] will call the pt within about one week after they file with the pt's insurance. [facility] will show the pt how to use the machine, fit for masks, and troubleshoot the auto-PAP if needed. A follow up appt was made for insurance purposes with Dr. Rexene Alberts on 08/07/2018 at 8:30am. Pt verbalized understanding to arrive 15 minutes early and bring their auto-PAP. A letter with all of this information in it will be mailed to the pt as a reminder. I verified with the pt that the address we have on file is correct. Pt verbalized understanding of results. Pt had no questions at this time but was encouraged to call back if questions arise. I have sent the order to [facility] and have received confirmation that they have received the order.

## 2018-06-17 NOTE — Procedures (Signed)
[facility] @[facility] [place], [place] [address] NAME:   Diane Giles                                                                       DOB: [birth year]  MEDICAL RECORD no: [id number]                                                        DOS:  06/11/2018 REFERRING PHYSICIAN: Delman Cheadle, MD STUDY PERFORMED: Home Sleep Test on Watch Pat HISTORY: 63 year old woman with a history of prediabetes, arthritis, hypertension, diverticulitis, osteoporosis and obesity, who was previously diagnosed with obstructive sleep apnea and placed on AutoPap therapy. She presents for re-evaluation. BMI of 37.3. STUDY RESULTS:    Total Recording Time: 9 hrs, 24 mins; Total Sleep Time: 7 hrs, 4 mins Total Apnea/Hypopnea Index (AHI): 15.9/h; RDI: 20.6/h; REM AHI: 55.3/h Average Oxygen Saturation: 93%; Lowest Oxygen Desaturation: 86%  Total Time Oxygen Saturation Below or at 88%: 1.9 minutes  Average Heart Rate: 62 bpm (between 50 and 81 bpm) IMPRESSION: OSA RECOMMENDATION: This home sleep test demonstrates moderate obstructive sleep apnea with a total AHI of 15.9/hour and O2 nadir of 86%. I will prescribe a new autoPAP machine and new supplies. Please note that untreated obstructive sleep apnea may carry additional perioperative morbidity. Patients with significant obstructive sleep apnea should receive perioperative PAP therapy and the surgeons and particularly the anesthesiologist should be informed of the diagnosis and the severity of the sleep disordered breathing. The patient should be cautioned not to drive, work at heights, or operate dangerous or heavy equipment when tired or sleepy. Review and reiteration of good sleep hygiene measures should be pursued with any patient. Other causes of the patient's symptoms, including circadian rhythm disturbances, an underlying mood disorder, medication effect and/or an underlying medical problem cannot be ruled out based on this test.  Clinical correlation is recommended. The patient and his referring provider will be notified of the test results. The patient will be seen in follow up in sleep clinic at [facility].  I certify that I have reviewed the raw data recording prior to the issuance of this report in accordance with the standards of the American Academy of Sleep Medicine (AASM).  Diane Age, MD, PhD [facility]) Diplomat, ABPN (Neurology and Sleep)

## 2018-06-17 NOTE — Addendum Note (Signed)
Addended by: Star Age on: 06/17/2018 08:17 AM   Modules accepted: Orders

## 2018-06-18 ENCOUNTER — Telehealth: Payer: Self-pay | Admitting: Family Medicine

## 2018-06-18 NOTE — Telephone Encounter (Signed)
LVM for pt regarding their appt on 2/20 with Dr. Brigitte Pulse. Due to provider being out on leave, appt has been cancelled. If pt calls in and would like to see another provider, please schedule at their convenience. If pt would like to wait until Dr. Brigitte Pulse returns to the office, please schedule accordingly at their convenience. Thank you!

## 2018-06-26 ENCOUNTER — Ambulatory Visit: Payer: 59 | Admitting: Family Medicine

## 2018-07-07 ENCOUNTER — Other Ambulatory Visit: Payer: Self-pay | Admitting: Family Medicine

## 2018-07-07 DIAGNOSIS — Z13820 Encounter for screening for osteoporosis: Secondary | ICD-10-CM

## 2018-07-11 ENCOUNTER — Ambulatory Visit (INDEPENDENT_AMBULATORY_CARE_PROVIDER_SITE_OTHER)
Admission: RE | Admit: 2018-07-11 | Discharge: 2018-07-11 | Disposition: A | Discharge status: Home / Self Care | Payer: Medicare Other | Source: Ambulatory Visit | Attending: Acute Care | Admitting: Acute Care

## 2018-07-11 DIAGNOSIS — Z87891 Personal history of nicotine dependence: Secondary | ICD-10-CM | POA: Diagnosis not present

## 2018-07-11 DIAGNOSIS — Z122 Encounter for screening for malignant neoplasm of respiratory organs: Secondary | ICD-10-CM

## 2018-07-17 ENCOUNTER — Other Ambulatory Visit: Payer: Self-pay | Admitting: Acute Care

## 2018-07-17 DIAGNOSIS — Z87891 Personal history of nicotine dependence: Secondary | ICD-10-CM

## 2018-07-17 DIAGNOSIS — Z122 Encounter for screening for malignant neoplasm of respiratory organs: Secondary | ICD-10-CM

## 2018-07-18 LAB — HM DIABETES EYE EXAM

## 2018-07-24 NOTE — Progress Notes (Signed)
CT scan chest with out contrast  cardiovasular aortic and branch vessel artheroscerolosis , normal heart size without pericardial  Effusion  07/11/18

## 2018-07-31 IMAGING — DX DG ABDOMEN ACUTE W/ 1V CHEST
4 series · 4 of 4 positions shown · non-contrast
Comparison: None.

CLINICAL DATA: Three day treatment for diverticulitis. Generalized
pain.

EXAM:
DG ABDOMEN ACUTE W/ 1V CHEST

[chest pa]
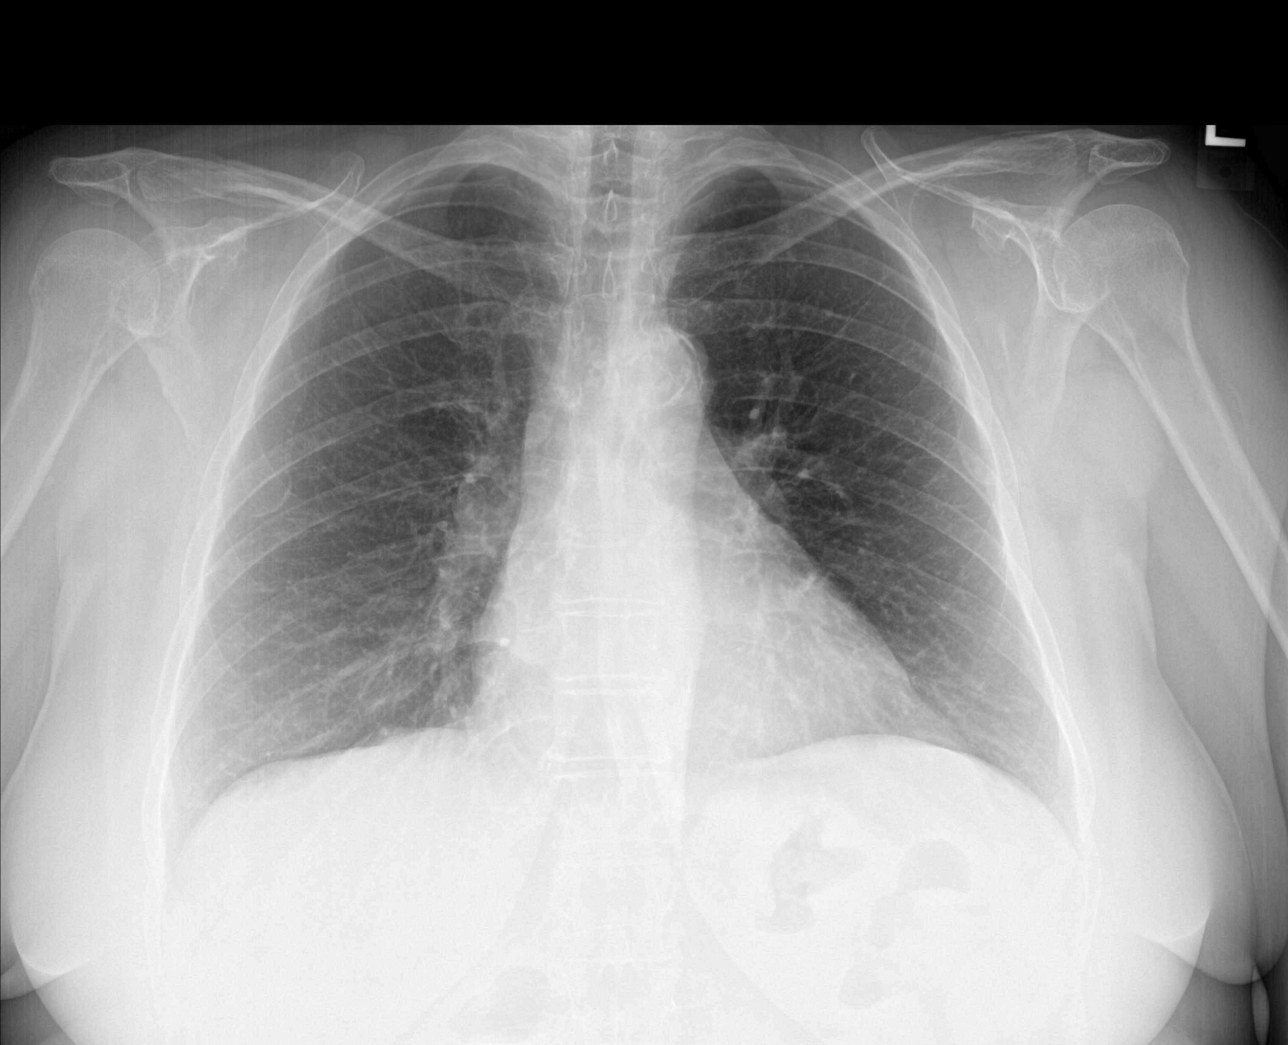

[abdomen erect]
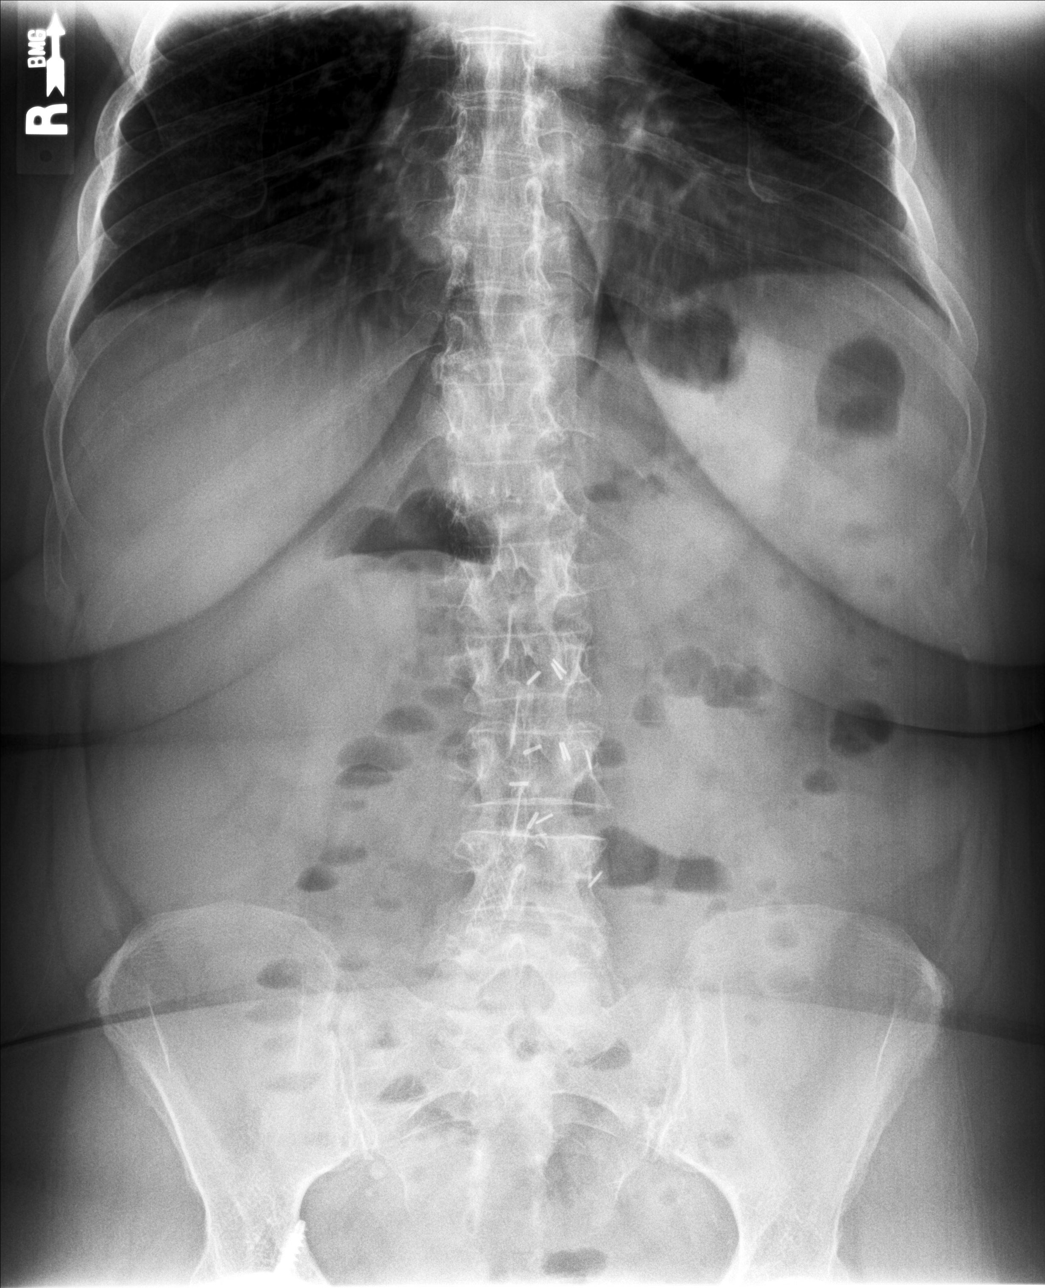

[abdomen supine (1 of 2)]
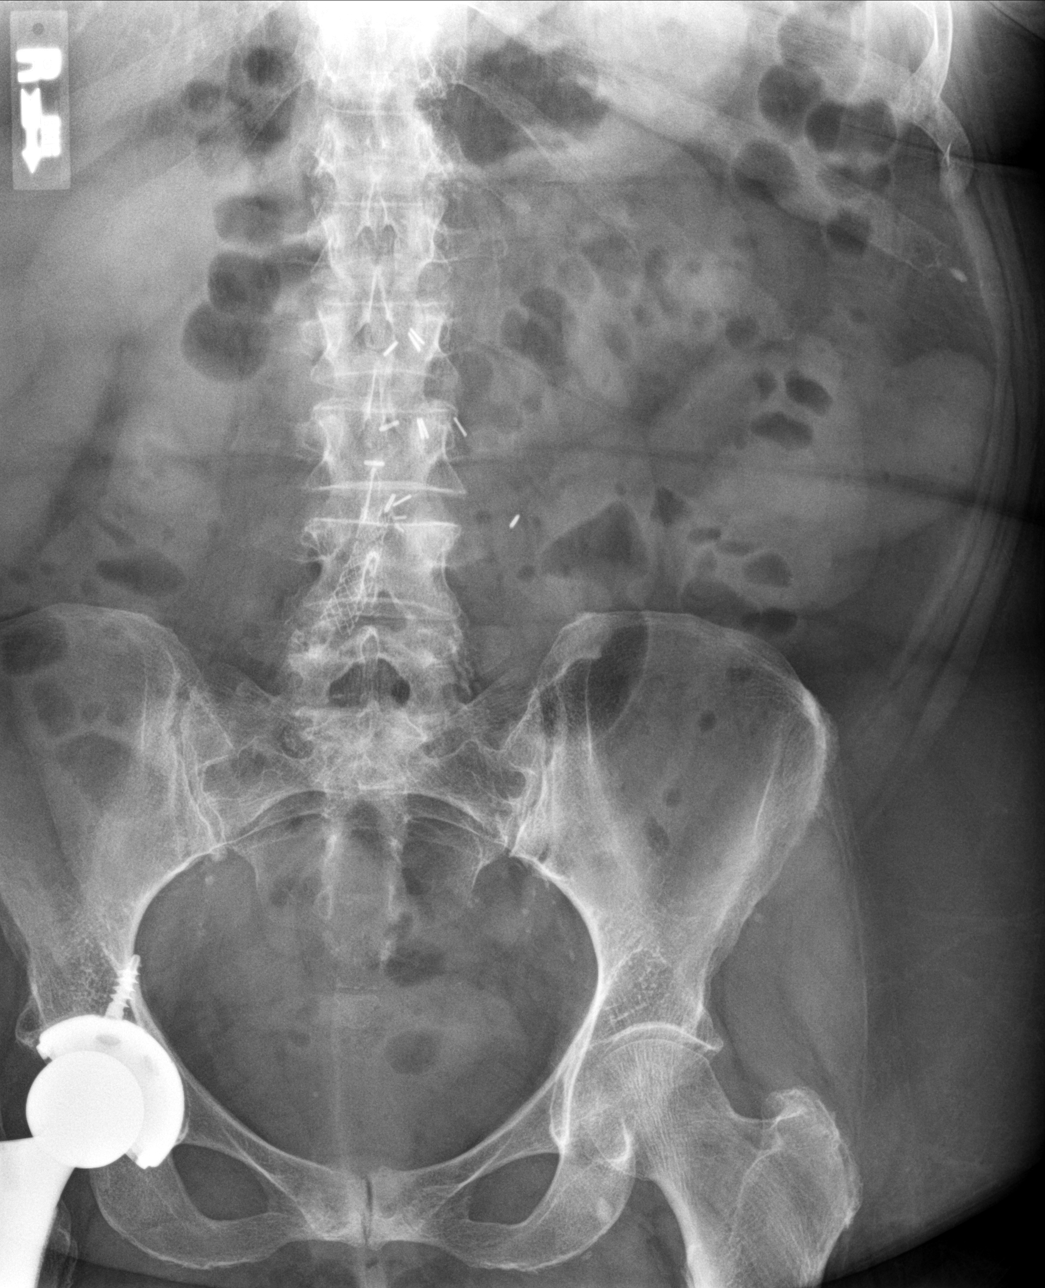

[abdomen supine (2 of 2)]
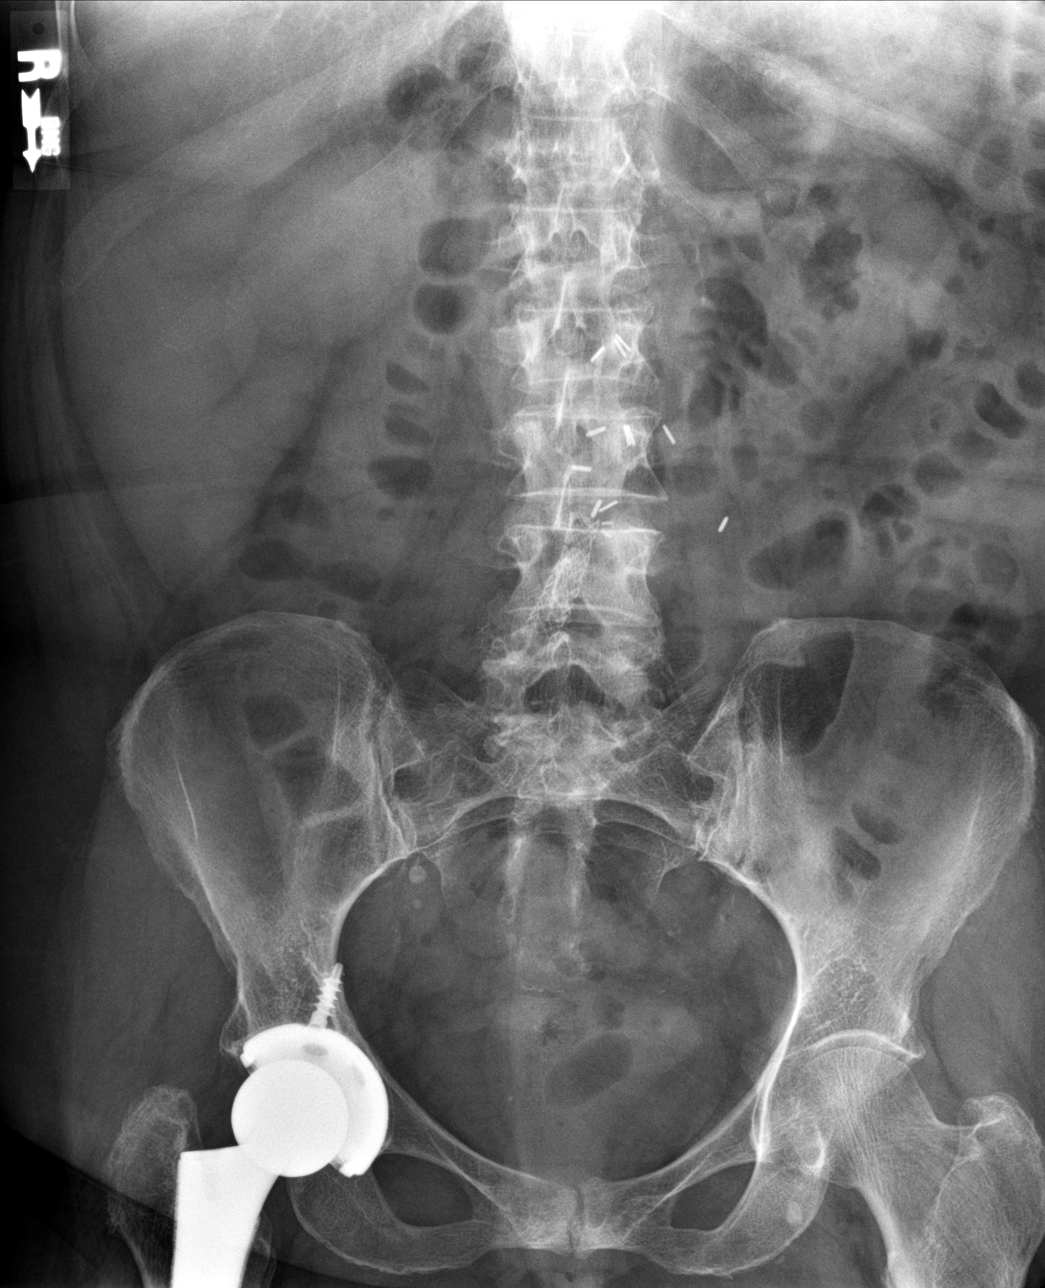

[4 of 4 positions shown; findings below may reference images not displayed]

FINDINGS: There is no evidence of dilated bowel loops or free intraperitoneal
air. No radiopaque calculi or other significant radiographic
abnormality is seen. Heart size and mediastinal contours are within
normal limits. Both lungs are clear.

Right hip arthroplasty.
IMPRESSION: Negative abdominal radiographs.  No acute cardiopulmonary disease.

## 2018-08-04 ENCOUNTER — Telehealth: Payer: Self-pay

## 2018-08-04 NOTE — Telephone Encounter (Signed)
Due to current COVID 19 pandemic, our office is severely reducing in office visits for at least the next 2 weeks, in order to minimize the risk to our patients and healthcare providers.   I called pt to discuss this. Pt reports that she started a new cpap but then returned it because it gave her problems. She is back to using her old cpap. She does not want an appt with our office at this time. She denies needing anything from our office. Pt's appt was cancelled.

## 2018-08-07 ENCOUNTER — Ambulatory Visit: Payer: Self-pay | Admitting: Neurology

## 2018-08-13 ENCOUNTER — Encounter (HOSPITAL_COMMUNITY): Payer: 59

## 2018-08-13 ENCOUNTER — Ambulatory Visit: Payer: 59 | Admitting: Vascular Surgery

## 2018-08-14 ENCOUNTER — Other Ambulatory Visit: Payer: Self-pay | Admitting: Family Medicine

## 2018-08-20 ENCOUNTER — Ambulatory Visit: Payer: 59 | Admitting: Vascular Surgery

## 2018-08-20 ENCOUNTER — Encounter (HOSPITAL_COMMUNITY): Payer: 59

## 2018-09-10 ENCOUNTER — Other Ambulatory Visit: Payer: 59

## 2018-10-23 ENCOUNTER — Other Ambulatory Visit: Payer: 59

## 2019-03-05 ENCOUNTER — Other Ambulatory Visit: Payer: Self-pay

## 2019-03-05 DIAGNOSIS — I6529 Occlusion and stenosis of unspecified carotid artery: Secondary | ICD-10-CM

## 2019-03-10 ENCOUNTER — Telehealth (HOSPITAL_COMMUNITY): Payer: Self-pay | Admitting: *Deleted

## 2019-03-10 NOTE — Telephone Encounter (Signed)

## 2019-03-11 ENCOUNTER — Ambulatory Visit (HOSPITAL_COMMUNITY)
Admission: RE | Admit: 2019-03-11 | Discharge: 2019-03-11 | Disposition: A | Discharge status: Home / Self Care | Payer: Medicare Other | Source: Ambulatory Visit | Attending: Vascular Surgery | Admitting: Vascular Surgery

## 2019-03-11 ENCOUNTER — Encounter: Payer: Self-pay | Admitting: Vascular Surgery

## 2019-03-11 ENCOUNTER — Ambulatory Visit (INDEPENDENT_AMBULATORY_CARE_PROVIDER_SITE_OTHER): Payer: Medicare Other | Admitting: Vascular Surgery

## 2019-03-11 ENCOUNTER — Other Ambulatory Visit: Payer: Self-pay

## 2019-03-11 ENCOUNTER — Other Ambulatory Visit: Payer: Self-pay | Admitting: Vascular Surgery

## 2019-03-11 VITALS — BP 124/61 | HR 66 | Temp 97.3°F | Resp 20 | Ht 58.5 in | Wt 192.0 lb

## 2019-03-11 DIAGNOSIS — I7409 Other arterial embolism and thrombosis of abdominal aorta: Secondary | ICD-10-CM | POA: Diagnosis not present

## 2019-03-11 DIAGNOSIS — Z9889 Other specified postprocedural states: Secondary | ICD-10-CM

## 2019-03-11 DIAGNOSIS — I739 Peripheral vascular disease, unspecified: Secondary | ICD-10-CM | POA: Diagnosis not present

## 2019-03-11 DIAGNOSIS — I6522 Occlusion and stenosis of left carotid artery: Secondary | ICD-10-CM | POA: Diagnosis not present

## 2019-03-11 DIAGNOSIS — I6529 Occlusion and stenosis of unspecified carotid artery: Secondary | ICD-10-CM | POA: Diagnosis present

## 2019-03-11 NOTE — Progress Notes (Signed)
Patient name: Diane Giles MRN: [id number] DOB: 1955-11-09 Sex: female  REASON FOR VISIT:   Follow-up of carotid disease and peripheral vascular disease.  HPI:   Diane Giles is a pleasant 63 y.o. female who I last saw on 02/05/2018.  The patient underwent an aorto iliac bypass in 2000.  In 2018 she had angioplasty and stenting of a stenosis in the right limb of her aortoiliac graft with a 7 x 39 VBX covered stent.  She had carotid bruits which prompted a duplex scan.  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  She did not have any significant stenosis in the right carotid.  She did have a 60 to 79% stenosis on the left in the mid ICA.  At the time of her last visit radiology on the right was 99% and on the left was 94%.  Since I saw her last, she denies any claudication, rest pain, or nonhealing ulcers.  She has no lower extremity symptoms.  She does complain of paresthesias in her right arm when she lies on her left side at night.  She denies any cervical disc disease or neck pain.  She also complains of right arm weakness and tiredness when she is doing any activities at home.  She is right-handed. She also has been having some dizziness recently.  She quit smoking 2 years ago.  Her husband also quit smoking recently.  She is on aspirin and is on a statin.  Past Medical History:  Diagnosis Date  . Aortoiliac occlusive disease (HCC) 12/03/2016  . Arthritis   . Diabetes mellitus without complication (HCC)    per pt she is pre- diabetic  . Diverticulitis   . Hypertension   . Osteoporosis   . Sleep apnea   . Tobacco use disorder 07/04/2012    Family History  Problem Relation Age of Onset  . Cancer Father 65       Dec age 56 with pancreatic Ca  . Migraines Mother   . Thyroid disease Mother        hypothyroid    SOCIAL HISTORY: Social History   Tobacco Use  .  Smoking status: Former Smoker    Years: 40.00    Types: Cigarettes    Quit date: 02/23/2017    Years since quitting: 2.0  . Smokeless tobacco: Never Used  Substance Use Topics  . Alcohol use: Yes    Alcohol/week: 0.0 standard drinks    Comment: very rare--1/month    Allergies  Allergen Reactions  . Codeine Itching    Current Outpatient Medications  Medication Sig Dispense Refill  . aspirin 81 MG chewable tablet Chew 81 mg by mouth daily.    Marland Kitchen atenolol-chlorthalidone (TENORETIC) 50-25 MG tablet TAKE 1 TABLET IN THE MORNING. 30 tablet 0  . atorvastatin (LIPITOR) 80 MG tablet Take 1 tablet (80 mg total) by mouth daily at 6 PM. 90 tablet 3  . furosemide (LASIX) 20 MG tablet Take 1 tablet (20 mg total) by mouth every morning. 90 tablet 1  . gabapentin (NEURONTIN) 100 MG capsule Take 100 mg by mouth 3 (three) times daily.    . hydrOXYzine (ATARAX/VISTARIL) 25 MG tablet Take 25 mg by mouth 2 (two) times daily.    . Lancets MISC Use as directed once a day. (DX: R73.9) 100 each 2  . losartan (COZAAR) 25 MG tablet TAKE 1 TABLET IN  THE P.M. 90 tablet 3  . metFORMIN (GLUCOPHAGE) 500 MG tablet TAKE 2 TABLETS TWICE DAILY WITH MEALS. 360 tablet 3  . Omega-3 Fatty Acids (OMEGA 3 PO) Take 1 capsule by mouth 2 (two) times daily.     No current facility-administered medications for this visit.     REVIEW OF SYSTEMS:  [X]  denotes positive finding, [ ]  denotes negative finding Cardiac  Comments:  Chest pain or chest pressure:    Shortness of breath upon exertion:    Short of breath when lying flat:    Irregular heart rhythm:        Vascular    Pain in calf, thigh, or hip brought on by ambulation:    Pain in feet at night that wakes you up from your sleep:     Blood clot in your veins:    Leg swelling:         Pulmonary    Oxygen at home:    Productive cough:     Wheezing:         Neurologic    Sudden weakness in arms or legs:     Sudden numbness in arms or legs:     Sudden onset of  difficulty speaking or slurred speech:    Temporary loss of vision in one eye:     Problems with dizziness:         Gastrointestinal    Blood in stool:     Vomited blood:         Genitourinary    Burning when urinating:     Blood in urine:        Psychiatric    Major depression:         Hematologic    Bleeding problems:    Problems with blood clotting too easily:        Skin    Rashes or ulcers:        Constitutional    Fever or chills:     PHYSICAL EXAM:   Vitals:   03/11/19 0946  BP: 124/61  Pulse: 66  Resp: 20  Temp: (!) 97.3 F (36.3 C)  SpO2: 98%  Weight: 192 lb (87.1 kg)  Height: 4' 10.5" (1.486 m)    GENERAL: The patient is a well-nourished female, in no acute distress. The vital signs are documented above. CARDIAC: There is a regular rate and rhythm.  VASCULAR: She has bilateral carotid bruits. I cannot palpate a right radial pulse.  She has a diminished but palpable left radial pulse. She has palpable femoral pulses. She has biphasic Doppler signals in both feet. PULMONARY: There is good air exchange bilaterally without wheezing or rales. ABDOMEN: Soft and non-tender with normal pitched bowel sounds.  MUSCULOSKELETAL: There are no major deformities or cyanosis. NEUROLOGIC: No focal weakness or paresthesias are detected. SKIN: There are no ulcers or rashes noted. PSYCHIATRIC: The patient has a normal affect.  DATA:    CAROTID DUPLEX: I have independently interpreted her carotid duplex scan today.  On the right side she has a proximal innominate stenosis which makes velocities in the carotid difficult to assess.  The right vertebral artery has retrograde flow.  On the left side she has a 40 to 59% carotid stenosis.  The left vertebral artery has antegrade flow.  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  She  is also having some dizziness.  Given her symptomatic disease related to her innominate artery stenosis and right subclavian artery stenosis I have recommended a CT angiogram to further evaluate the this.  I will call her with those results after.  I explained stenting of the innominate artery is associated with significant risk and the surgical options are fairly extensive potentially requiring median sternotomy.  Potentially she would be a candidate for a covered stent in her innominate.  Fortunately her carotid disease on the left has improved.  She will need a follow-up carotid duplex scan in 1 year and I can arrange that when I discussed her CT angiogram results with her.  PERIPHERAL VASCULAR DISEASE: She has an aorto iliac bypass and has had a VBX stent in her right iliac at the anastomosis.  I will get ABIs in 1 year and again we will arrange this when I discussed the results of her CT angiogram.  Waverly Ferrari [facility] [phone]

## 2019-03-12 ENCOUNTER — Other Ambulatory Visit: Payer: Self-pay

## 2019-03-12 DIAGNOSIS — I739 Peripheral vascular disease, unspecified: Secondary | ICD-10-CM

## 2019-03-12 DIAGNOSIS — I6522 Occlusion and stenosis of left carotid artery: Secondary | ICD-10-CM

## 2019-03-20 ENCOUNTER — Ambulatory Visit
Admission: RE | Admit: 2019-03-20 | Discharge: 2019-03-20 | Disposition: A | Discharge status: Home / Self Care | Payer: Medicare Other | Source: Ambulatory Visit | Attending: Vascular Surgery | Admitting: Vascular Surgery

## 2019-03-20 DIAGNOSIS — Z9889 Other specified postprocedural states: Secondary | ICD-10-CM

## 2019-03-20 MED ORDER — IOPAMIDOL (ISOVUE-370) INJECTION 76%
75.0000 mL | Freq: Once | INTRAVENOUS | Status: AC | PRN
  Administered 2019-03-20: 10:00:00 100 mL via INTRAVENOUS

## 2019-03-24 ENCOUNTER — Telehealth: Payer: Self-pay | Admitting: *Deleted

## 2019-03-25 ENCOUNTER — Other Ambulatory Visit: Payer: Self-pay

## 2019-03-25 ENCOUNTER — Encounter: Payer: Self-pay | Admitting: Vascular Surgery

## 2019-03-25 ENCOUNTER — Ambulatory Visit (INDEPENDENT_AMBULATORY_CARE_PROVIDER_SITE_OTHER): Payer: Medicare Other | Admitting: Vascular Surgery

## 2019-03-25 DIAGNOSIS — I6522 Occlusion and stenosis of left carotid artery: Secondary | ICD-10-CM

## 2019-03-25 DIAGNOSIS — I771 Stricture of artery: Secondary | ICD-10-CM

## 2019-03-25 DIAGNOSIS — I739 Peripheral vascular disease, unspecified: Secondary | ICD-10-CM | POA: Diagnosis not present

## 2019-03-25 NOTE — Progress Notes (Signed)
Patient name: Diane Giles DOB: 11-20-1955 Sex: female    Referring Provider is Sherren Mocha, MD  PCP is Margot Ables, MD  REASON FOR VIRTUAL VISIT: Innominate artery stenosis.  I connected with Anishka C Dimond on 03/25/19 at  3:00 PM EST by a video enabled telemedicine application and verified that I am speaking with the correct person using two identifiers. I discussed the limitations of evaluation and management by telemedicine and the availability of in person appointments. The patient expressed understanding and agreed to proceed.  Location: Patient: Home Provider: Office  HPI: Diane Giles is a 63 y.o. female who I last saw on 03/11/2019.  The patient was having significant right arm symptoms likely related to an innominate artery stenosis and possibly a right subclavian artery stenosis.  I recommended a CT angiogram to further evaluate this.  Our visit today was to discuss these results.  I have been following this patient with peripheral vascular disease.  She is undergone a previous aortoiliac bypass in 2020.  More recently in 2018 she had angioplasty and stenting of the right limb of her graft.  She is done well from that standpoint.  However at the time of her last visit on 03/11/2019 she was complaining of paresthesias in her right arm in addition to right arm weakness and tiredness when she was doing any activities at home.  She is right-handed.  She is also been having some dizziness recently.  Noninvasive studies suggested innominate artery stenosis and possible right subclavian artery stenosis.  Since her last visit she is continuing to have symptoms including weakness and numbness in the right arm and some dizziness.   Current Outpatient Medications  Medication Sig Dispense Refill  . aspirin 81 MG chewable tablet Chew 81 mg by mouth daily.    Marland Kitchen atenolol-chlorthalidone (TENORETIC) 50-25 MG tablet TAKE 1 TABLET IN THE MORNING. 30 tablet 0  . atorvastatin  (LIPITOR) 80 MG tablet Take 1 tablet (80 mg total) by mouth daily at 6 PM. 90 tablet 3  . furosemide (LASIX) 20 MG tablet Take 1 tablet (20 mg total) by mouth every morning. 90 tablet 1  . gabapentin (NEURONTIN) 100 MG capsule Take 100 mg by mouth 3 (three) times daily.    . hydrOXYzine (ATARAX/VISTARIL) 25 MG tablet Take 25 mg by mouth 2 (two) times daily.    . Lancets MISC Use as directed once a day. (DX: R73.9) 100 each 2  . losartan (COZAAR) 25 MG tablet TAKE 1 TABLET IN THE P.M. 90 tablet 3  . metFORMIN (GLUCOPHAGE) 500 MG tablet TAKE 2 TABLETS TWICE DAILY WITH MEALS. 360 tablet 3  . Omega-3 Fatty Acids (OMEGA 3 PO) Take 1 capsule by mouth 2 (two) times daily.     No current facility-administered medications for this visit.    REVIEW OF SYSTEMS: Arly.Keller ] denotes positive finding; [  ] denotes negative finding  CARDIOVASCULAR:  [ ]  chest pain   [ ]  dyspnea on exertion  [ ]  leg swelling  CONSTITUTIONAL:  [ ]  fever   [ ]  chills   OBSERVATIONS/OBJECTIVE: There were no vitals filed for this visit. The patient was alert and oriented.  She was not short of breath in conversation.  DATA: CT ANGIOGRAM NECK: I reviewed the CT angiogram of her upper chest and neck which shows a tight stenosis of the proximal innominate artery with poststenotic dilatation.  Unfortunately the arch is heavily calcified and for this reason I do not think she  is a candidate for angioplasty and stenting of this proximal innominate stenosis.  The subclavian artery does not appear to have significant stenosis.  There is some calcific plaque at the carotid bifurcation on the right but no focal narrowing.  The common carotid artery and internal carotid artery are small in the right likely secondary to underfilling related to the proximal innominate stenosis.  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  Given  the severe calcific disease of the aortic arch I do not think she is a candidate for angioplasty and stenting.  I explained that I think the only way to address this would be with aorto innominate bypass and I have recommended consultation with Dr. Rexanne Mano to review her films and discuss this option with her.  She is agreeable to proceed with an office visit with Dr. Laneta Simmers.  I have explained that the natural history of innominate artery disease is not as clear as with carotid disease but given the severity of the stenosis and her current symptoms in addition to the potential risk of stroke I think surgery should be considered.  MODERATE LEFT CAROTID STENOSIS: She has a 40 to 59% left carotid stenosis.  She is scheduled for a follow-up carotid duplex scan in November 2021.  PERIPHERAL VASCULAR DISEASE: She has been scheduled for ABIs in November 2021.  FOLLOW UP INSTRUCTIONS:   I discussed the assessment and treatment plan with the patient. The patient was provided an opportunity to ask questions and all were answered. The patient agreed with the plan and demonstrated an understanding of the instructions. The patient was advised to call back or seek an in-person evaluation if the symptoms worsen or if the condition fails to improve as anticipated.  I provided 15 minutes of non-face-to-face time during this encounter.  Waverly Ferrari [facility]

## 2019-04-01 ENCOUNTER — Encounter: Payer: Medicare Other | Admitting: Surgery

## 2019-04-08 ENCOUNTER — Other Ambulatory Visit: Payer: Self-pay

## 2019-04-08 ENCOUNTER — Institutional Professional Consult (permissible substitution) (INDEPENDENT_AMBULATORY_CARE_PROVIDER_SITE_OTHER): Payer: Medicare Other | Admitting: Surgery

## 2019-04-08 ENCOUNTER — Encounter: Payer: Self-pay | Admitting: Surgery

## 2019-04-08 VITALS — BP 142/67 | HR 68 | Temp 97.3°F | Resp 20 | Ht 58.5 in | Wt 193.7 lb

## 2019-04-08 DIAGNOSIS — I771 Stricture of artery: Secondary | ICD-10-CM

## 2019-04-08 DIAGNOSIS — I6522 Occlusion and stenosis of left carotid artery: Secondary | ICD-10-CM | POA: Diagnosis not present

## 2019-04-08 NOTE — Progress Notes (Signed)
Cardiothoracic Surgery Consultation  PCP is Buzzy Han, MD Referring Provider is Angelia Mould,*  Chief Complaint  Patient presents with  . Consult    innominate artery stenosis    HPI:  The patient is a 63 year old woman with a history of hypertension, diabetes, prior smoking, and diffuse vascular disease who underwent aortoiliac bypass in 2000.  She subsequently underwent angioplasty and stenting of a stenosis in the right limb of her aortoiliac graft in 2018.  She had carotid bruits which prompted a duplex scan that showed evidence of innominate artery stenosis.  She subsequently saw Dr. 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  The patient reports worsening symptoms of right upper extremity fatigue with exertion.  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  She has also had numbness in the right arm and hand and some dizziness.   Past Medical History:  Diagnosis Date  . Aortoiliac occlusive disease ([place]) 12/03/2016  . Arthritis   . Diabetes mellitus without complication ([place])    per pt she is pre- diabetic  . Diverticulitis   . Hypertension   . Osteoporosis   . Sleep apnea   . Tobacco use disorder 07/04/2012    Past Surgical History:  Procedure Laterality Date  . ABDOMINAL AORTOGRAM W/LOWER EXTREMITY N/A 12/03/2016   Procedure: Abdominal Aortogram w/Lower Extremity;  Surgeon: Angelia Mould, MD;  Location: [facility];  Service: Cardiovascular;  Laterality: N/A;  . DILATATION & CURETTAGE/HYSTEROSCOPY WITH MYOSURE N/A 12/27/2015   Procedure: DILATATION & CURETTAGE/HYSTEROSCOPY;  Surgeon: Nunzio Cobbs, MD;  Location: [facility];  Service: Gynecology;  Laterality: N/A;  . DILATION AND CURETTAGE OF UTERUS  11/2015  . ELBOW SURGERY Left ~2007   tendon repair by Dr. Veverly Fells  .  EYE SURGERY Bilateral 2008   lasik  . lower aortic bypass  2000   per patient  . PERIPHERAL VASCULAR INTERVENTION  12/03/2016   Procedure: Peripheral Vascular Intervention;  Surgeon: Angelia Mould, MD;  Location: [facility];  Service: Cardiovascular;;  . TOTAL HIP ARTHROPLASTY Right 07/27/2014   Procedure: RIGHT TOTAL HIP ARTHROPLASTY ANTERIOR APPROACH;  Surgeon: Paralee Cancel, MD;  Location: [facility];  Service: Orthopedics;  Laterality: Right;    Family History  Problem Relation Age of Onset  . Cancer Father 51       Dec age 57 with pancreatic Ca  . Migraines Mother   . Thyroid disease Mother        hypothyroid    Social History Social History   Tobacco Use  . Smoking status: Former Smoker    Years: 40.00    Types: Cigarettes    Quit date: 02/23/2017    Years since quitting: 2.1  . Smokeless tobacco: Never Used  Substance Use Topics  . Alcohol use: Yes    Alcohol/week: 0.0 standard drinks    Comment: very rare--1/month  . Drug use: No    Current Outpatient Medications  Medication Sig Dispense Refill  . aspirin 81 MG chewable tablet Chew 81 mg by mouth daily.    Marland Kitchen atenolol-chlorthalidone (TENORETIC) 50-25 MG tablet TAKE 1 TABLET IN THE MORNING. 30 tablet 0  . atorvastatin (LIPITOR) 80 MG tablet Take 1 tablet (80 mg total) by mouth daily at 6 PM. 90 tablet 3  . furosemide (LASIX)  20 MG tablet Take 1 tablet (20 mg total) by mouth every morning. 90 tablet 1  . gabapentin (NEURONTIN) 100 MG capsule Take 100 mg by mouth 3 (three) times daily.    . hydrOXYzine (ATARAX/VISTARIL) 25 MG tablet Take 25 mg by mouth 2 (two) times daily.    . Lancets MISC Use as directed once a day. (DX: R73.9) 100 each 2  . losartan (COZAAR) 25 MG tablet TAKE 1 TABLET IN THE P.M. 90 tablet 3  . metFORMIN (GLUCOPHAGE) 500 MG tablet TAKE 2 TABLETS TWICE DAILY WITH MEALS. 360 tablet 3  . Omega-3 Fatty Acids (OMEGA 3 PO) Take 1 capsule by mouth 2 (two) times daily.     No current  facility-administered medications for this visit.     Allergies  Allergen Reactions  . Codeine Itching    Review of Systems  Constitutional: Positive for activity change and fatigue.  HENT: Negative.   Eyes: Negative.   Respiratory: Negative for chest tightness and shortness of breath.   Cardiovascular: Negative for chest pain and leg swelling.  Gastrointestinal: Negative.   Endocrine: Negative.   Genitourinary: Negative.   Musculoskeletal: Negative.   Skin: Negative.   Allergic/Immunologic: Negative.   Neurological: Positive for dizziness, weakness and numbness. Negative for syncope.  Hematological: Negative.   Psychiatric/Behavioral: Negative.     BP (!) 142/67 (BP Location: Left Arm)   Pulse 68   Temp (!) 97.3 F (36.3 C) (Skin)   Resp 20   Ht 4' 10.5" (1.486 m)   Wt 193 lb 11.2 oz (87.9 kg)   LMP 05/07/2005 (Approximate)   SpO2 96%   BMI 39.79 kg/m  Physical Exam Constitutional:      Appearance: Normal appearance. She is obese.  HENT:     Head: Normocephalic and atraumatic.  Eyes:     Extraocular Movements: Extraocular movements intact.     Conjunctiva/sclera: Conjunctivae normal.     Pupils: Pupils are equal, round, and reactive to light.  Neck:     Musculoskeletal: Normal range of motion and neck supple. No muscular tenderness.     Comments: Bilateral cervical bruits Cardiovascular:     Rate and Rhythm: Normal rate and regular rhythm.     Heart sounds: Normal heart sounds. No murmur.     Comments: Absent right radial and ulnar pulse Pulmonary:     Effort: Pulmonary effort is normal.     Breath sounds: Normal breath sounds.  Abdominal:     Tenderness: There is no abdominal tenderness.  Musculoskeletal:        General: No swelling.  Skin:    General: Skin is warm and dry.  Neurological:     General: No focal deficit present.     Mental Status: She is alert and oriented to person, place, and time.  Psychiatric:        Mood and Affect: Mood normal.         Behavior: Behavior normal.      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  EXAM: CT ANGIOGRAPHY CHEST WITH CONTRAST  TECHNIQUE: Multidetector CT imaging of the chest was performed using the standard protocol during bolus administration of intravenous contrast. Multiplanar CT image reconstructions and MIPs were obtained to evaluate the vascular anatomy.  CONTRAST:  180mL ISOVUE-370 IOPAMIDOL (ISOVUE-370) INJECTION 76%  COMPARISON:  July 11, 2018.  FINDINGS: Cardiovascular: Atherosclerosis of thoracic aorta is noted without aneurysm or dissection. Severe stenosis is noted at the origin of the right innominate  artery secondary to calcified plaque, with poststenotic dilatation. The right common carotid and subclavian arteries appear to be widely patent. Mild stenosis is noted at origin of left common carotid artery secondary to calcified plaque. Left subclavian artery is unremarkable. Normal cardiac size. No pericardial effusion.  Mediastinum/Nodes: No enlarged mediastinal, hilar, or axillary lymph nodes. Thyroid gland, trachea, and esophagus demonstrate no significant findings.  Lungs/Pleura: Lungs are clear. No pleural effusion or pneumothorax.  Upper Abdomen: No acute abnormality.  Musculoskeletal: No chest wall abnormality. No acute or significant osseous findings.  Review of the MIP images confirms the above findings.  IMPRESSION: Severe stenosis is seen involving the origin of the right innominate artery secondary to calcified plaque. Poststenotic dilatation is noted. There also appears to be mild stenosis involving the origin of the left common carotid artery secondary to eccentric calcified plaque.  Aortic Atherosclerosis (ICD10-I70.0).   Electronically Signed   By: Marijo Conception M.D.   On: 03/20/2019 12:55   CLINICAL DATA:  Follow-up left carotid stenosis and right subclavian stenosis.  EXAM:  CT ANGIOGRAPHY NECK  TECHNIQUE: Multidetector CT imaging of the neck was performed using the standard protocol during bolus administration of intravenous contrast. Multiplanar CT image reconstructions and MIPs were obtained to evaluate the vascular anatomy. Carotid stenosis measurements (when applicable) are obtained utilizing NASCET criteria, using the distal internal carotid diameter as the denominator.  CONTRAST:  156mL ISOVUE-370 IOPAMIDOL (ISOVUE-370) INJECTION 76%  COMPARISON:  None.  FINDINGS: Aortic arch: Aortic atherosclerosis. Branching pattern is normal. No origin stenosis of the left common carotid artery or left subclavian artery. Severe stenosis of the innominate artery origin due apparently to soft plaque. Luminal diameter measures only 2 mm.  Right carotid system: Beyond the proximal innominate stenosis, the common carotid origin is widely patent. The common carotid artery is a small vessel measuring only 3.2 mm in diameter. The vessel is patent to the bifurcation where there is calcified plaque affecting the bifurcation but no additional stenosis. There is marked caliber reduction of the internal carotid artery beginning at the distal bulb, the vessel measuring only 1.5 mm in diameter. Patent to the skull base and carotid canal at that diameter.  Left carotid system: Common carotid artery is widely patent to the bifurcation region. Calcified plaque at the carotid bifurcation and proximal ICA bulb but no stenosis. Cervical ICA is widely patent to the skull base as a normal sized vessel.  Vertebral arteries: 30% stenosis of the right subclavian artery origin from the innominate. Right vertebral artery origin is widely patent. The non dominant right vertebral artery is widely patent through the cervical region, through the foramen magnum to the basilar. The dominant left vertebral artery is widely patent at its origin and through the cervical region to  the basilar.  Skeleton: Cervical spondylosis most pronounced at C5-6.  Other neck: No neck mass or lymphadenopathy.  Upper chest: Negative  IMPRESSION: Aortic atherosclerosis.  Soft plaque at the innominate artery origin. Lumen measures only 2 mm. The vessel regains a normal caliber 1.5 cm beyond its origin.  Atherosclerotic disease at the right carotid bifurcation but no additional stenosis. Beginning at the level of the distal bulb, the right ICA shows a markedly narrowed diameter measuring 1.5 mm and keeps this size to and through the skull base. Intracranial study was not performed. This size could be secondary to flow restriction at the proximal innominate artery but could also herald the presence of intracranial disease including occlusion of the supraclinoid ICA.  Atherosclerotic change at the left carotid bifurcation and ICA bulb but without stenosis.  No vertebral artery disease of significance.   Electronically Signed   By: Nelson Chimes M.D.   On: 03/20/2019 10:55   Impression:  This 63 year old patient has high-grade proximal innominate artery stenosis with exertional fatigue and paresthesias in the right upper extremity as well as dizziness.  The innominate artery regains a normal caliber of 1.5 cm beyond the stenosis.  There is some atherosclerotic disease at the right carotid bifurcation but there does not appear to be significant stenosis.  The right common carotid artery is relatively small and the right ICA measures 1.5 mm beginning at the level of the distal bulb and continuing through the skull base.  Given her symptoms I think it is advisable to perform aorto innominate bypass via a median sternotomy.  There is significant calcified plaque throughout the aortic arch which precludes successful stenting of this innominate stenosis.  Given her diffuse vascular disease and medical history I think she has should have a gated cardiac CTA to evaluate her  coronary arteries prior to surgery.  She does not have any anginal symptoms but is certainly at increased risk for coronary occlusive disease.  I reviewed the CT images with her and her husband and answered all their questions.  She would like to think about this further before scheduling surgery but understands it needs to get done.   Plan:  She will call us back to schedule aorto innominate bypass via median sternotomy and this will be coordinated with Dr. Scot Dock.  She will have a gated cardiac CTA scheduled preoperatively to evaluate her coronary arteries once she has decided to schedule a surgery date.  I spent 60 minutes performing this consultation and > 50% of this time was spent face to face counseling and coordinating the care of this patient's high-grade innominate artery stenosis.   Gaye Pollack, MD [facility] [phone]

## 2019-04-13 ENCOUNTER — Other Ambulatory Visit: Payer: Self-pay | Admitting: *Deleted

## 2019-04-13 DIAGNOSIS — I771 Stricture of artery: Secondary | ICD-10-CM

## 2019-04-13 DIAGNOSIS — I700 Atherosclerosis of aorta: Secondary | ICD-10-CM

## 2019-04-13 DIAGNOSIS — Z01818 Encounter for other preprocedural examination: Secondary | ICD-10-CM

## 2019-04-15 ENCOUNTER — Encounter (HOSPITAL_COMMUNITY): Payer: Self-pay

## 2019-04-16 ENCOUNTER — Telehealth (HOSPITAL_COMMUNITY): Payer: Self-pay | Admitting: Emergency Medicine

## 2019-04-16 ENCOUNTER — Encounter: Payer: Self-pay | Admitting: *Deleted

## 2019-04-16 ENCOUNTER — Other Ambulatory Visit: Payer: Self-pay | Admitting: *Deleted

## 2019-04-16 DIAGNOSIS — I771 Stricture of artery: Secondary | ICD-10-CM

## 2019-04-16 NOTE — Telephone Encounter (Signed)
Reaching out to patient to offer assistance regarding upcoming cardiac imaging study; pt verbalizes understanding of appt date/time, parking situation and where to check in, pre-test NPO status and medications ordered, and verified current allergies; name and call back number provided for further questions should they arise Marchia Bond RN Navigator Cardiac Imaging [facility] [phone] office [phone] cell   Since patient had PCP adjust medications after last BMP, istat creat will be ordered to re-check creat for contrast admin.

## 2019-04-17 ENCOUNTER — Other Ambulatory Visit: Payer: Self-pay

## 2019-04-17 ENCOUNTER — Ambulatory Visit (HOSPITAL_COMMUNITY)
Admission: RE | Admit: 2019-04-17 | Discharge: 2019-04-17 | Disposition: A | Discharge status: Home / Self Care | Payer: Medicare Other | Source: Ambulatory Visit | Attending: Surgery | Admitting: Surgery

## 2019-04-17 DIAGNOSIS — I700 Atherosclerosis of aorta: Secondary | ICD-10-CM

## 2019-04-17 DIAGNOSIS — Z01818 Encounter for other preprocedural examination: Secondary | ICD-10-CM | POA: Diagnosis present

## 2019-04-17 DIAGNOSIS — I771 Stricture of artery: Secondary | ICD-10-CM

## 2019-04-17 LAB — POCT I-STAT CREATININE: Creatinine, Ser: 1.1 mg/dL — ABNORMAL HIGH (ref 0.44–1.00)

## 2019-04-17 MED ORDER — NITROGLYCERIN 0.4 MG SL SUBL
SUBLINGUAL_TABLET | SUBLINGUAL | Status: AC
  Filled 2019-04-17: qty 2

## 2019-04-17 MED ORDER — NITROGLYCERIN 0.4 MG SL SUBL
0.8000 mg | SUBLINGUAL_TABLET | Freq: Once | SUBLINGUAL | Status: AC
  Administered 2019-04-17: 0.8 mg via SUBLINGUAL

## 2019-04-17 MED ORDER — IOHEXOL 350 MG/ML SOLN
80.0000 mL | Freq: Once | INTRAVENOUS | Status: AC | PRN
  Administered 2019-04-17: 80 mL via INTRAVENOUS

## 2019-04-17 NOTE — Progress Notes (Signed)
CT scan completed. Tolerated well. D/C home walking, awake and alert. In no distress. 

## 2019-04-21 ENCOUNTER — Other Ambulatory Visit: Payer: Self-pay

## 2019-04-21 ENCOUNTER — Emergency Department (HOSPITAL_COMMUNITY): Payer: Medicare Other

## 2019-04-21 ENCOUNTER — Emergency Department (HOSPITAL_COMMUNITY)
Admission: EM | Admit: 2019-04-21 | Discharge: 2019-04-22 | Discharge status: Against Medical Advice | Payer: Medicare Other | Attending: Emergency Medicine | Admitting: Emergency Medicine

## 2019-04-21 ENCOUNTER — Encounter (HOSPITAL_COMMUNITY): Payer: Self-pay | Admitting: Emergency Medicine

## 2019-04-21 DIAGNOSIS — R0789 Other chest pain: Secondary | ICD-10-CM | POA: Diagnosis present

## 2019-04-21 DIAGNOSIS — Z5321 Procedure and treatment not carried out due to patient leaving prior to being seen by health care provider: Secondary | ICD-10-CM | POA: Diagnosis not present

## 2019-04-21 LAB — CBC
HCT: 35.6 % — ABNORMAL LOW (ref 36.0–46.0)
Hemoglobin: 11.8 g/dL — ABNORMAL LOW (ref 12.0–15.0)
MCH: 29.5 pg (ref 26.0–34.0)
MCHC: 33.1 g/dL (ref 30.0–36.0)
MCV: 89 fL (ref 80.0–100.0)
Platelets: 361 10*3/uL (ref 150–400)
RBC: 4 MIL/uL (ref 3.87–5.11)
RDW: 13.2 % (ref 11.5–15.5)
WBC: 10.2 10*3/uL (ref 4.0–10.5)
nRBC: 0 % (ref 0.0–0.2)

## 2019-04-21 LAB — PROTIME-INR
INR: 0.9 (ref 0.8–1.2)
Prothrombin Time: 12.2 seconds (ref 11.4–15.2)

## 2019-04-21 LAB — BASIC METABOLIC PANEL
Anion gap: 14 (ref 5–15)
BUN: 19 mg/dL (ref 8–23)
CO2: 24 mmol/L (ref 22–32)
Calcium: 9.1 mg/dL (ref 8.9–10.3)
Chloride: 99 mmol/L (ref 98–111)
Creatinine, Ser: 1.12 mg/dL — ABNORMAL HIGH (ref 0.44–1.00)
GFR calc Af Amer: 60 mL/min — ABNORMAL LOW (ref >60)
GFR calc non Af Amer: 52 mL/min — ABNORMAL LOW (ref >60)
Glucose, Bld: 132 mg/dL — ABNORMAL HIGH (ref 70–99)
Potassium: 3.1 mmol/L — ABNORMAL LOW (ref 3.5–5.1)
Sodium: 137 mmol/L (ref 135–145)

## 2019-04-21 LAB — TROPONIN I (HIGH SENSITIVITY): Troponin I (High Sensitivity): 10 ng/L (ref <18)

## 2019-04-21 MED ORDER — SODIUM CHLORIDE 0.9% FLUSH: 3.0000 mL | Freq: Once | INTRAVENOUS | Status: DC

## 2019-04-21 NOTE — ED Triage Notes (Signed)
Patient reports intermittent central chest pain for 1 week radiating to left arm with mild SOB/dry cough and nausea. Denies fever or chills .

## 2019-04-22 ENCOUNTER — Ambulatory Visit (HOSPITAL_BASED_OUTPATIENT_CLINIC_OR_DEPARTMENT_OTHER): Payer: Medicare Other

## 2019-04-22 DIAGNOSIS — Z01818 Encounter for other preprocedural examination: Secondary | ICD-10-CM

## 2019-04-22 DIAGNOSIS — I700 Atherosclerosis of aorta: Secondary | ICD-10-CM

## 2019-04-22 DIAGNOSIS — I771 Stricture of artery: Secondary | ICD-10-CM

## 2019-04-22 DIAGNOSIS — Z5321 Procedure and treatment not carried out due to patient leaving prior to being seen by health care provider: Secondary | ICD-10-CM | POA: Diagnosis not present

## 2019-04-22 LAB — ECHOCARDIOGRAM COMPLETE

## 2019-04-22 NOTE — ED Notes (Signed)
Pt up to desk stating that she felt better and was leaving. Armband removed.

## 2019-04-22 NOTE — ED Notes (Signed)
Called patient to reassess vitals x3 and had no answer. 

## 2019-05-13 NOTE — Pre-Procedure Instructions (Signed)
Your procedure is scheduled on Monday, January 11th, from 07:30 AM to 12:45 PM.   Report to [facility] "[facility]" at 05:30 A.M, and check in at the Admitting office.  Call this number if you have problems the morning of surgery:  [phone]  Call [phone] if you have any questions prior to your surgery date Monday-Friday 8am-4pm.    Remember:  Do not eat or drink after midnight the night before your surgery.    Take these medicines the morning of surgery with A SIP OF WATER : gabapentin (NEURONTIN)   Follow your surgeon's instructions on when to stop Aspirin.  If no instructions were given by your surgeon then you will need to call the office to get those instructions.     As of today, STOP taking any NSAIDs such as, Aleve, Naproxen, Ibuprofen, Motrin, Advil, Goody's, BC's, all herbal medications, fish oil, and all vitamins.   WHAT DO I DO ABOUT MY DIABETES MEDICATION?   Marland Kitchen Do not take metFORMIN (GLUCOPHAGE) the morning of surgery.   HOW TO MANAGE YOUR DIABETES BEFORE AND AFTER SURGERY  Why is it important to control my blood sugar before and after surgery? . Improving blood sugar levels before and after surgery helps healing and can limit problems. . A way of improving blood sugar control is eating a healthy diet by: o  Eating less sugar and carbohydrates o  Increasing activity/exercise o  Talking with your doctor about reaching your blood sugar goals . High blood sugars (greater than 180 mg/dL) can raise your risk of infections and slow your recovery, so you will need to focus on controlling your diabetes during the weeks before surgery. . Make sure that the doctor who takes care of your diabetes knows about your planned surgery including the date and location.  How do I manage my blood sugar before surgery? . Check your blood sugar at least 4 times a day, starting 2 days before surgery, to make sure that the level is not too high or low. . Check your blood  sugar the morning of your surgery when you wake up and every 2 hours until you get to the Short Stay unit. o If your blood sugar is less than 70 mg/dL, you will need to treat for low blood sugar: - Do not take insulin. - Treat a low blood sugar (less than 70 mg/dL) with  cup of clear juice (cranberry or apple), 4 glucose tablets, OR glucose gel. - Recheck blood sugar in 15 minutes after treatment (to make sure it is greater than 70 mg/dL). If your blood sugar is not greater than 70 mg/dL on recheck, call [phone] for further instructions. . Report your blood sugar to the short stay nurse when you get to Short Stay.  . If you are admitted to the hospital after surgery: o Your blood sugar will be checked by the staff and you will probably be given insulin after surgery (instead of oral diabetes medicines) to make sure you have good blood sugar levels. o The goal for blood sugar control after surgery is 80-180 mg/dL.   The Morning of Surgery  Do not wear jewelry, make-up or nail polish.  Do not wear lotions, powders, perfumes, or deodorant.  Do not shave 48 hours prior to surgery.    Do not bring valuables to the hospital.  [facility] is not responsible for any belongings or valuables.  If you are a smoker, DO NOT Smoke 24 hours prior to  surgery.  If you wear a CPAP at night please bring your mask, tubing, and machine the morning of surgery.  Remember that you must have someone to transport you home after your surgery, and remain with you for 24 hours if you are discharged the same day.   Please bring cases for contacts, glasses, hearing aids, dentures or bridgework because it cannot be worn into surgery.    Leave your suitcase in the car.  After surgery it may be brought to your room.  For patients admitted to the hospital, discharge time will be determined by your treatment team.  Patients discharged the day of surgery will not be allowed to drive home.    Special  instructions:   [facility]- Preparing For Surgery  Before surgery, you can play an important role. Because skin is not sterile, your skin needs to be as free of germs as possible. You can reduce the number of germs on your skin by washing with CHG (chlorahexidine gluconate) Soap before surgery.  CHG is an antiseptic cleaner which kills germs and bonds with the skin to continue killing germs even after washing.    Oral Hygiene is also important to reduce your risk of infection.  Remember - BRUSH YOUR TEETH THE MORNING OF SURGERY WITH YOUR REGULAR TOOTHPASTE  Please do not use if you have an allergy to CHG or antibacterial soaps. If your skin becomes reddened/irritated stop using the CHG.  Do not shave (including legs and underarms) for at least 48 hours prior to first CHG shower. It is OK to shave your face.  Please follow these instructions carefully.   1. Shower the NIGHT BEFORE SURGERY and the MORNING OF SURGERY with CHG Soap.   2. If you chose to wash your hair, wash your hair first as usual with your normal shampoo.  3. After you shampoo, rinse your hair and body thoroughly to remove the shampoo.  4. Use CHG as you would any other liquid soap. You can apply CHG directly to the skin and wash gently with a scrungie or a clean washcloth.   5. Apply the CHG Soap to your body ONLY FROM THE NECK DOWN.  Do not use on open wounds or open sores. Avoid contact with your eyes, ears, mouth and genitals (private parts). Wash Face and genitals (private parts)  with your normal soap.   6. Wash thoroughly, paying special attention to the area where your surgery will be performed.  7. Thoroughly rinse your body with warm water from the neck down.  8. DO NOT shower/wash with your normal soap after using and rinsing off the CHG Soap.  9. Pat yourself dry with a CLEAN TOWEL.  10. Wear CLEAN PAJAMAS to bed the night before surgery, wear comfortable clothes the morning of surgery  11. Place CLEAN  SHEETS on your bed the night of your first shower and DO NOT SLEEP WITH PETS.    Day of Surgery:  Remember to brush your teeth WITH YOUR REGULAR TOOTHPASTE. Please shower the morning of surgery with the CHG soap Do not apply any deodorants/lotions. Please wear clean clothes to the hospital/surgery center.     Please read over the following fact sheets that you were given.

## 2019-05-14 ENCOUNTER — Encounter (HOSPITAL_COMMUNITY): Payer: Self-pay

## 2019-05-14 ENCOUNTER — Other Ambulatory Visit: Payer: Self-pay

## 2019-05-14 ENCOUNTER — Other Ambulatory Visit (HOSPITAL_COMMUNITY)
Admission: RE | Admit: 2019-05-14 | Discharge: 2019-05-14 | Disposition: A | Discharge status: Home / Self Care | Payer: Medicare Other | Source: Ambulatory Visit | Attending: Surgery | Admitting: Surgery

## 2019-05-14 ENCOUNTER — Encounter (HOSPITAL_COMMUNITY)
Admission: RE | Admit: 2019-05-14 | Discharge: 2019-05-14 | Disposition: A | Discharge status: Home / Self Care | Payer: Medicare Other | Source: Ambulatory Visit | Attending: Surgery | Admitting: Surgery

## 2019-05-14 DIAGNOSIS — E119 Type 2 diabetes mellitus without complications: Secondary | ICD-10-CM | POA: Diagnosis not present

## 2019-05-14 DIAGNOSIS — I771 Stricture of artery: Secondary | ICD-10-CM | POA: Diagnosis not present

## 2019-05-14 DIAGNOSIS — I700 Atherosclerosis of aorta: Secondary | ICD-10-CM | POA: Insufficient documentation

## 2019-05-14 DIAGNOSIS — Z20822 Contact with and (suspected) exposure to covid-19: Secondary | ICD-10-CM | POA: Diagnosis not present

## 2019-05-14 DIAGNOSIS — Z01812 Encounter for preprocedural laboratory examination: Secondary | ICD-10-CM | POA: Insufficient documentation

## 2019-05-14 HISTORY — DX: Cardiac murmur, unspecified: R01.1

## 2019-05-14 HISTORY — DX: Nonrheumatic aortic (valve) stenosis: I35.0

## 2019-05-14 LAB — CBC
HCT: 36.6 % (ref 36.0–46.0)
Hemoglobin: 12.1 g/dL (ref 12.0–15.0)
MCH: 29.7 pg (ref 26.0–34.0)
MCHC: 33.1 g/dL (ref 30.0–36.0)
MCV: 89.9 fL (ref 80.0–100.0)
Platelets: 395 10*3/uL (ref 150–400)
RBC: 4.07 MIL/uL (ref 3.87–5.11)
RDW: 13.4 % (ref 11.5–15.5)
WBC: 13.9 10*3/uL — ABNORMAL HIGH (ref 4.0–10.5)
nRBC: 0 % (ref 0.0–0.2)

## 2019-05-14 LAB — HEMOGLOBIN A1C
Hgb A1c MFr Bld: 6.3 % — ABNORMAL HIGH (ref 4.8–5.6)
Mean Plasma Glucose: 134.11 mg/dL

## 2019-05-14 LAB — COMPREHENSIVE METABOLIC PANEL
ALT: 19 U/L (ref 0–44)
AST: 22 U/L (ref 15–41)
Albumin: 3.6 g/dL (ref 3.5–5.0)
Alkaline Phosphatase: 140 U/L — ABNORMAL HIGH (ref 38–126)
Anion gap: 16 — ABNORMAL HIGH (ref 5–15)
BUN: 17 mg/dL (ref 8–23)
CO2: 21 mmol/L — ABNORMAL LOW (ref 22–32)
Calcium: 8.8 mg/dL — ABNORMAL LOW (ref 8.9–10.3)
Chloride: 100 mmol/L (ref 98–111)
Creatinine, Ser: 1.01 mg/dL — ABNORMAL HIGH (ref 0.44–1.00)
GFR calc Af Amer: >60 mL/min (ref >60)
GFR calc non Af Amer: 58 mL/min — ABNORMAL LOW (ref >60)
Glucose, Bld: 128 mg/dL — ABNORMAL HIGH (ref 70–99)
Potassium: 3.5 mmol/L (ref 3.5–5.1)
Sodium: 137 mmol/L (ref 135–145)
Total Bilirubin: 0.4 mg/dL (ref 0.3–1.2)
Total Protein: 7.3 g/dL (ref 6.5–8.1)

## 2019-05-14 LAB — TYPE AND SCREEN
ABO/RH(D): A NEG
Antibody Screen: NEGATIVE

## 2019-05-14 LAB — BLOOD GAS, ARTERIAL
Acid-Base Excess: 0.8 mmol/L (ref 0.0–2.0)
Bicarbonate: 24.7 mmol/L (ref 20.0–28.0)
Drawn by: 421801
FIO2: 21
O2 Saturation: 94.8 %
Patient temperature: 37
pCO2 arterial: 38.6 mmHg (ref 32.0–48.0)
pH, Arterial: 7.422 (ref 7.350–7.450)
pO2, Arterial: 76.4 mmHg — ABNORMAL LOW (ref 83.0–108.0)

## 2019-05-14 LAB — URINALYSIS, ROUTINE W REFLEX MICROSCOPIC
Bilirubin Urine: NEGATIVE
Glucose, UA: NEGATIVE mg/dL
Hgb urine dipstick: NEGATIVE
Ketones, ur: NEGATIVE mg/dL
Nitrite: NEGATIVE
Protein, ur: NEGATIVE mg/dL
Specific Gravity, Urine: 1.006 (ref 1.005–1.030)
pH: 6 (ref 5.0–8.0)

## 2019-05-14 LAB — GLUCOSE, CAPILLARY: Glucose-Capillary: 112 mg/dL — ABNORMAL HIGH (ref 70–99)

## 2019-05-14 LAB — APTT: aPTT: 31 seconds (ref 24–36)

## 2019-05-14 LAB — SURGICAL PCR SCREEN
MRSA, PCR: POSITIVE — AB
Staphylococcus aureus: POSITIVE — AB

## 2019-05-14 LAB — ABO/RH: ABO/RH(D): A NEG

## 2019-05-14 LAB — PROTIME-INR
INR: 0.9 (ref 0.8–1.2)
Prothrombin Time: 12.2 seconds (ref 11.4–15.2)

## 2019-05-14 NOTE — Progress Notes (Signed)
PCP -  Dr. Buzzy Han Cardiologist - Denies Vascular: Dr. [name]  PPM/ICD - Denies  Chest x-ray - 05/14/2019 EKG - 04/23/2019 Stress Test - Denies ECHO - 04/22/2019 Cardiac Cath - Denies  Sleep Study - Yes, patient has sleep apnea CPAP - yes  Patient does not check her CBG.  Blood Thinner Instructions: N/A Aspirin Instructions: Patient told to call MD for instructions on when to stop ASA.  ERAS Protcol - No  COVID TEST- 05/13/2019; Pending   Anesthesia review: Yes, abnormal EKG.  Patient denies shortness of breath, fever, cough and chest pain at PAT appointment   All instructions explained to the patient, with a verbal understanding of the material. Patient agrees to go over the instructions while at home for a better understanding. Patient also instructed to self quarantine after being tested for COVID-19. The opportunity to ask questions was provided.

## 2019-05-15 ENCOUNTER — Telehealth: Payer: Self-pay

## 2019-05-15 ENCOUNTER — Other Ambulatory Visit: Payer: Self-pay

## 2019-05-15 ENCOUNTER — Encounter (HOSPITAL_COMMUNITY): Payer: Self-pay

## 2019-05-15 DIAGNOSIS — N3000 Acute cystitis without hematuria: Secondary | ICD-10-CM

## 2019-05-15 MED ORDER — TRANEXAMIC ACID 1000 MG/10ML IV SOLN
1.5000 mg/kg/h | INTRAVENOUS | Status: AC
  Administered 2019-05-18: 1.5 mg/kg/h via INTRAVENOUS
  Filled 2019-05-15: qty 25

## 2019-05-15 MED ORDER — PLASMA-LYTE 148 IV SOLN
INTRAVENOUS | Status: DC
  Filled 2019-05-15: qty 2.5

## 2019-05-15 MED ORDER — MILRINONE LACTATE IN DEXTROSE 20-5 MG/100ML-% IV SOLN
0.3000 ug/kg/min | INTRAVENOUS | Status: DC
  Filled 2019-05-15: qty 100

## 2019-05-15 MED ORDER — VANCOMYCIN HCL 1500 MG/300ML IV SOLN
1500.0000 mg | INTRAVENOUS | Status: AC
  Administered 2019-05-18: 1500 mg via INTRAVENOUS
  Filled 2019-05-15: qty 300

## 2019-05-15 MED ORDER — INSULIN REGULAR(HUMAN) IN NACL 100-0.9 UT/100ML-% IV SOLN
INTRAVENOUS | Status: AC
  Administered 2019-05-18: 1 [IU]/h via INTRAVENOUS
  Filled 2019-05-15: qty 100

## 2019-05-15 MED ORDER — MAGNESIUM SULFATE 50 % IJ SOLN
40.0000 meq | INTRAMUSCULAR | Status: DC
  Filled 2019-05-15: qty 9.85

## 2019-05-15 MED ORDER — NITROGLYCERIN IN D5W 200-5 MCG/ML-% IV SOLN
2.0000 ug/min | INTRAVENOUS | Status: AC
  Administered 2019-05-18: 10 ug/min via INTRAVENOUS
  Filled 2019-05-15: qty 250

## 2019-05-15 MED ORDER — PHENYLEPHRINE HCL-NACL 20-0.9 MG/250ML-% IV SOLN
30.0000 ug/min | INTRAVENOUS | Status: AC
  Administered 2019-05-18: 30 ug/min via INTRAVENOUS
  Filled 2019-05-15: qty 250

## 2019-05-15 MED ORDER — NOREPINEPHRINE 4 MG/250ML-% IV SOLN
0.0000 ug/min | INTRAVENOUS | Status: DC
  Filled 2019-05-15: qty 250

## 2019-05-15 MED ORDER — SODIUM CHLORIDE 0.9 % IV SOLN
INTRAVENOUS | Status: DC
  Filled 2019-05-15: qty 30

## 2019-05-15 MED ORDER — DEXMEDETOMIDINE HCL IN NACL 400 MCG/100ML IV SOLN
0.1000 ug/kg/h | INTRAVENOUS | Status: AC
  Administered 2019-05-18: .2 ug/kg/h via INTRAVENOUS
  Filled 2019-05-15: qty 100

## 2019-05-15 MED ORDER — POTASSIUM CHLORIDE 2 MEQ/ML IV SOLN
80.0000 meq | INTRAVENOUS | Status: DC
  Filled 2019-05-15: qty 40

## 2019-05-15 MED ORDER — TRANEXAMIC ACID (OHS) PUMP PRIME SOLUTION
2.0000 mg/kg | INTRAVENOUS | Status: DC
  Filled 2019-05-15: qty 1.68

## 2019-05-15 MED ORDER — SODIUM CHLORIDE 0.9 % IV SOLN
1.5000 g | INTRAVENOUS | Status: AC
  Administered 2019-05-18: 1.5 g via INTRAVENOUS
  Filled 2019-05-15 (×2): qty 1.5

## 2019-05-15 MED ORDER — EPINEPHRINE PF 1 MG/ML IJ SOLN
0.0000 ug/min | INTRAVENOUS | Status: DC
  Filled 2019-05-15: qty 4

## 2019-05-15 MED ORDER — SODIUM CHLORIDE 0.9 % IV SOLN
750.0000 mg | INTRAVENOUS | Status: DC
  Filled 2019-05-15: qty 750

## 2019-05-15 MED ORDER — SULFAMETHOXAZOLE-TRIMETHOPRIM 800-160 MG PO TABS: 1.0000 | ORAL_TABLET | Freq: Two times a day (BID) | ORAL | 0 refills | Status: DC

## 2019-05-15 MED ORDER — TRANEXAMIC ACID (OHS) BOLUS VIA INFUSION
15.0000 mg/kg | INTRAVENOUS | Status: AC
  Administered 2019-05-18: 1261.5 mg via INTRAVENOUS
  Filled 2019-05-15: qty 1262

## 2019-05-15 NOTE — Anesthesia Preprocedure Evaluation (Addendum)
Anesthesia Evaluation  Patient identified by MRN, date of birth, ID band Patient awake    Reviewed: Allergy & Precautions, NPO status , Patient's Chart, lab work & pertinent test results, reviewed documented beta blocker date and time   History of Anesthesia Complications Negative for: history of anesthetic complications  Airway Mallampati: II  TM Distance: >3 FB Neck ROM: Full    Dental  (+) Edentulous Upper, Edentulous Lower   Pulmonary sleep apnea and Continuous Positive Airway Pressure Ventilation , COPD,  COPD inhaler, former smoker (quit 2018),  05/14/2019 SARS CoV2 NEG   breath sounds clear to auscultation       Cardiovascular hypertension, Pt. on medications and Pt. on home beta blockers (-) angina+ CAD (mild non-obstructive) and + Peripheral Vascular Disease  + Valvular Problems/Murmurs (mild-mod AS) AS  Rhythm:Regular Rate:Normal  04/2019 ECHO: EF 70-75%, mild-mod AS with peal grad 29 mmHg, mean grad 14.7 mmHg   Neuro/Psych negative neurological ROS     GI/Hepatic Neg liver ROS, GERD  Medicated and Controlled,  Endo/Other  diabetes (glu 141), Oral Hypoglycemic AgentsMorbid obesity  Renal/GU negative Renal ROS     Musculoskeletal  (+) Arthritis ,   Abdominal (+) + obese,   Peds  Hematology negative hematology ROS (+)   Anesthesia Other Findings   Reproductive/Obstetrics                           Anesthesia Physical Anesthesia Plan  ASA: III  Anesthesia Plan: General   Post-op Pain Management:    Induction: Intravenous  PONV Risk Score and Plan: 3 and Treatment may vary due to age or medical condition  Airway Management Planned: Oral ETT  Additional Equipment: Arterial line, Ultrasound Guidance Line Placement and PA Cath  Intra-op Plan:   Post-operative Plan: Post-operative intubation/ventilation  Informed Consent: I have reviewed the patients History and Physical,  chart, labs and discussed the procedure including the risks, benefits and alternatives for the proposed anesthesia with the patient or authorized representative who has indicated his/her understanding and acceptance.       Plan Discussed with: CRNA and Surgeon  Anesthesia Plan Comments: (PAT note written 05/15/2019 by Myra Gianotti, PA-C. )      Anesthesia Quick Evaluation

## 2019-05-15 NOTE — H&P (Signed)
[address]       [place] [address]             [phone]      Cardiothoracic Surgery Admission History and Physical   PCP is Buzzy Han, MD  Referring Provider is Angelia Mould,*      Chief Complaint  Patient presents with innominate artery stenosis          HPI:  The patient is a 64 year old woman with a history of hypertension, diabetes, prior smoking, and diffuse vascular disease who underwent aortoiliac bypass in 2000. She subsequently underwent angioplasty and stenting of a stenosis in the right limb of her aortoiliac graft in 2018. She had carotid bruits which prompted a duplex scan that showed evidence of innominate artery stenosis. She subsequently saw Dr. 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 The patient reports worsening symptoms of right upper extremity fatigue with exertion. 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 She has also had numbness in the right arm and hand and some dizziness.   She had a gated cardiac CTA which showed normal coronary artery origin with left dominance.  Her coronary calcium score is 140 which was the 60th percentile for age and sex matched controls.  There is mild nonobstructive disease in the proximal LAD.  The CT of the chest also showed a 7 mm right lower lobe pulmonary nodule which will require a further follow-up chest CT scan in 6 to 12 months.     Past Medical History:  Diagnosis Date  . Aortoiliac occlusive disease ([place]) 12/03/2016  . Arthritis   . Diabetes mellitus without complication ([place])    per pt she is pre- diabetic  . Diverticulitis   . Hypertension   . Osteoporosis   . Sleep apnea   . Tobacco use disorder 07/04/2012        Past Surgical History:  Procedure Laterality Date  . ABDOMINAL AORTOGRAM W/LOWER  EXTREMITY N/A 12/03/2016   Procedure: Abdominal Aortogram w/Lower Extremity; Surgeon: Angelia Mould, MD; Location: [facility]; Service: Cardiovascular; Laterality: N/A;  . DILATATION & CURETTAGE/HYSTEROSCOPY WITH MYOSURE N/A 12/27/2015   Procedure: DILATATION & CURETTAGE/HYSTEROSCOPY; Surgeon: Nunzio Cobbs, MD; Location: [facility]; Service: Gynecology; Laterality: N/A;  . DILATION AND CURETTAGE OF UTERUS  11/2015  . ELBOW SURGERY Left ~2007   tendon repair by Dr. Veverly Fells  . EYE SURGERY Bilateral 2008   lasik  . lower aortic bypass  2000   per patient  . PERIPHERAL VASCULAR INTERVENTION  12/03/2016   Procedure: Peripheral Vascular Intervention; Surgeon: Angelia Mould, MD; Location: [facility]; Service: Cardiovascular;;  . TOTAL HIP ARTHROPLASTY Right 07/27/2014   Procedure: RIGHT TOTAL HIP ARTHROPLASTY ANTERIOR APPROACH; Surgeon: Paralee Cancel, MD; Location: [facility]; Service: Orthopedics; Laterality: Right;        Family History  Problem Relation Age of Onset  . Cancer Father 35   Dec age 68 with pancreatic Ca  . Migraines Mother   . Thyroid disease Mother    hypothyroid   Social History  Social History        Tobacco Use  . Smoking status: Former Smoker    Years: 40.00    Types: Cigarettes    Quit date: 02/23/2017    Years  since quitting: 2.1  . Smokeless tobacco: Never Used  Substance Use Topics  . Alcohol use: Yes    Alcohol/week: 0.0 standard drinks    Comment: very rare--1/month  . Drug use: No         Current Outpatient Medications  Medication Sig Dispense Refill  . aspirin 81 MG chewable tablet Chew 81 mg by mouth daily.    Marland Kitchen atenolol-chlorthalidone (TENORETIC) 50-25 MG tablet TAKE 1 TABLET IN THE MORNING. 30 tablet 0  . atorvastatin (LIPITOR) 80 MG tablet Take 1 tablet (80 mg total) by mouth daily at 6 PM. 90 tablet 3  . furosemide (LASIX) 20 MG tablet Take 1 tablet (20 mg total) by mouth every morning. 90 tablet 1  .  gabapentin (NEURONTIN) 100 MG capsule Take 100 mg by mouth 3 (three) times daily.    . hydrOXYzine (ATARAX/VISTARIL) 25 MG tablet Take 25 mg by mouth 2 (two) times daily.    . Lancets MISC Use as directed once a day. (DX: R73.9) 100 each 2  . losartan (COZAAR) 25 MG tablet TAKE 1 TABLET IN THE P.M. 90 tablet 3  . metFORMIN (GLUCOPHAGE) 500 MG tablet TAKE 2 TABLETS TWICE DAILY WITH MEALS. 360 tablet 3  . Omega-3 Fatty Acids (OMEGA 3 PO) Take 1 capsule by mouth 2 (two) times daily.     No current facility-administered medications for this visit.        Allergies  Allergen Reactions  . Codeine Itching   Review of Systems  Constitutional: Positive for activity change and fatigue.  HENT: Negative.  Eyes: Negative.  Respiratory: Negative for chest tightness and shortness of breath.  Cardiovascular: Negative for chest pain and leg swelling.  Gastrointestinal: Negative.  Endocrine: Negative.  Genitourinary: Negative.  Musculoskeletal: Negative.  Skin: Negative.  Allergic/Immunologic: Negative.  Neurological: Positive for dizziness, weakness and numbness. Negative for syncope.  Hematological: Negative.  Psychiatric/Behavioral: Negative.   BP (!) 142/67 (BP Location: Left Arm)  Pulse 68  Temp (!) 97.3 F (36.3 C) (Skin)  Resp 20  Ht 4' 10.5" (1.486 m)  Wt 193 lb 11.2 oz (87.9 kg)  LMP 05/07/2005 (Approximate)  SpO2 96%  BMI 39.79 kg/m  Physical Exam  Constitutional:  Appearance: Normal appearance. She is obese.  HENT:  Head: Normocephalic and atraumatic.  Eyes:  Extraocular Movements: Extraocular movements intact.  Conjunctiva/sclera: Conjunctivae normal.  Pupils: Pupils are equal, round, and reactive to light.  Neck:  Musculoskeletal: Normal range of motion and neck supple. No muscular tenderness.  Comments: Bilateral cervical bruits Cardiovascular:  Rate and Rhythm: Normal rate and regular rhythm.  Heart sounds: Normal heart sounds. No murmur.  Comments: Absent right  radial and ulnar pulse Pulmonary:  Effort: Pulmonary effort is normal.  Breath sounds: Normal breath sounds.  Abdominal:  Tenderness: There is no abdominal tenderness.  Musculoskeletal:  General: No swelling.  Skin:  General: Skin is warm and dry.  Neurological:  General: No focal deficit present.  Mental Status: She is alert and oriented to person, place, and time.  Psychiatric:  Mood and Affect: Mood normal.  Behavior: Behavior normal.   Diagnostic Tests:  CLINICAL DATA: Right subclavian stenosis by ultrasound.  EXAM:  CT ANGIOGRAPHY CHEST WITH CONTRAST  TECHNIQUE:  Multidetector CT imaging of the chest was performed using the  standard protocol during bolus administration of intravenous  contrast. Multiplanar CT image reconstructions and MIPs were  obtained to evaluate the vascular anatomy.  CONTRAST: 167mL ISOVUE-370 IOPAMIDOL (ISOVUE-370) INJECTION 76%  COMPARISON: July 11, 2018.  FINDINGS:  Cardiovascular: Atherosclerosis of thoracic aorta is noted without  aneurysm or dissection. Severe stenosis is noted at the origin of  the right innominate artery secondary to calcified plaque, with  poststenotic dilatation. The right common carotid and subclavian  arteries appear to be widely patent. Mild stenosis is noted at  origin of left common carotid artery secondary to calcified plaque.  Left subclavian artery is unremarkable. Normal cardiac size. No  pericardial effusion.  Mediastinum/Nodes: No enlarged mediastinal, hilar, or axillary lymph  nodes. Thyroid gland, trachea, and esophagus demonstrate no  significant findings.  Lungs/Pleura: Lungs are clear. No pleural effusion or pneumothorax.  Upper Abdomen: No acute abnormality.  Musculoskeletal: No chest wall abnormality. No acute or significant  osseous findings.  Review of the MIP images confirms the above findings.  IMPRESSION:  Severe stenosis is seen involving the origin of the right innominate  artery secondary  to calcified plaque. Poststenotic dilatation is  noted. There also appears to be mild stenosis involving the origin  of the left common carotid artery secondary to eccentric calcified  plaque.  Aortic Atherosclerosis (ICD10-I70.0).  Electronically Signed  By: Marijo Conception M.D.  On: 03/20/2019 12:55  CLINICAL DATA: Follow-up left carotid stenosis and right subclavian  stenosis.  EXAM:  CT ANGIOGRAPHY NECK  TECHNIQUE:  Multidetector CT imaging of the neck was performed using the  standard protocol during bolus administration of intravenous  contrast. Multiplanar CT image reconstructions and MIPs were  obtained to evaluate the vascular anatomy. Carotid stenosis  measurements (when applicable) are obtained utilizing NASCET  criteria, using the distal internal carotid diameter as the  denominator.  CONTRAST: 190mL ISOVUE-370 IOPAMIDOL (ISOVUE-370) INJECTION 76%  COMPARISON: None.  FINDINGS:  Aortic arch: Aortic atherosclerosis. Branching pattern is normal. No  origin stenosis of the left common carotid artery or left subclavian  artery. Severe stenosis of the innominate artery origin due  apparently to soft plaque. Luminal diameter measures only 2 mm.  Right carotid system: Beyond the proximal innominate stenosis, the  common carotid origin is widely patent. The common carotid artery is  a small vessel measuring only 3.2 mm in diameter. The vessel is  patent to the bifurcation where there is calcified plaque affecting  the bifurcation but no additional stenosis. There is marked caliber  reduction of the internal carotid artery beginning at the distal  bulb, the vessel measuring only 1.5 mm in diameter. Patent to the  skull base and carotid canal at that diameter.  Left carotid system: Common carotid artery is widely patent to the  bifurcation region. Calcified plaque at the carotid bifurcation and  proximal ICA bulb but no stenosis. Cervical ICA is widely patent to  the skull base  as a normal sized vessel.  Vertebral arteries: 30% stenosis of the right subclavian artery  origin from the innominate. Right vertebral artery origin is widely  patent. The non dominant right vertebral artery is widely patent  through the cervical region, through the foramen magnum to the  basilar. The dominant left vertebral artery is widely patent at its  origin and through the cervical region to the basilar.  Skeleton: Cervical spondylosis most pronounced at C5-6.  Other neck: No neck mass or lymphadenopathy.  Upper chest: Negative  IMPRESSION:  Aortic atherosclerosis.  Soft plaque at the innominate artery origin. Lumen measures only 2  mm. The vessel regains a normal caliber 1.5 cm beyond its origin.  Atherosclerotic disease at the right carotid bifurcation but no  additional  stenosis. Beginning at the level of the distal bulb, the  right ICA shows a markedly narrowed diameter measuring 1.5 mm and  keeps this size to and through the skull base. Intracranial study  was not performed. This size could be secondary to flow restriction  at the proximal innominate artery but could also herald the presence  of intracranial disease including occlusion of the supraclinoid ICA.  Atherosclerotic change at the left carotid bifurcation and ICA bulb  but without stenosis.  No vertebral artery disease of significance.  Electronically Signed  By: Nelson Chimes M.D.  On: 03/20/2019 10:55    Impression:   This 64 year old patient has high-grade proximal innominate artery stenosis with exertional fatigue and paresthesias in the right upper extremity as well as dizziness. The innominate artery regains a normal caliber of 1.5 cm beyond the stenosis. There is some atherosclerotic disease at the right carotid bifurcation but there does not appear to be significant stenosis. The right common carotid artery is relatively small and the right ICA measures 1.5 mm beginning at the level of the distal bulb and  continuing through the skull base. Given her symptoms I think it is advisable to perform aorto innominate bypass via a median sternotomy. There is significant calcified plaque throughout the aortic arch which precludes successful stenting of this innominate stenosis.  Gated cardiac CTA shows mild nonobstructive coronary disease in the proximal LAD.  There is a 7 mm right lower lobe pulmonary nodule which will require follow-up in 6 to 12 months.    Plan:   Median sternotomy with aorto innominate bypass.   Gaye Pollack, MD  [facility]  [phone]

## 2019-05-15 NOTE — Telephone Encounter (Signed)
RX for Bactrim DS I tab po BID x3days called to pharm./ patient is aware

## 2019-05-15 NOTE — Progress Notes (Unsigned)
Pt scheduled for aorta-innomiate bypass on 05/18/19. Pre-op labs suggest possible UTI. Rx for Bactrim DS 800-160 mg q 12 hours (7 doses) called in to [facility] per Dr. Cyndia Bent. Pt notified.

## 2019-05-15 NOTE — Progress Notes (Signed)
Anesthesia Chart Review:  Case: [id number] Date/Time: 05/18/19 0715   Procedures:      AORTA -Titus Mould BYPASS (N/A )     STERNOTOMY (N/A )   Anesthesia type: General   Pre-op diagnosis: INNOMIATE ARTERY STENOSIS   Location: [facility] OR ROOM 15 / [facility]   Surgeons: Gaye Pollack, MD      DISCUSSION: Patient is a 64 year old female scheduled for the above procedure. Reportedly vascular surgeon [name], MD will be assisting.   History includes former smoker (quit 02/23/17), innominate artery stenosis, murmur/aortic stenosis (mild-moderate 04/2019), PAD/Aortoiliac occlusive disease (s/p aortobiiliac bypass 2000; s/p stenting for stenosis at right CIA anastomosis), DM2 (on metformin), OSA (CPAP), diverticulitis. Mild non-obstructive CAD (25-49% in the proximal LAD) by 12/112020 coronary CT. BMI is consistent with obesity.  05/14/19 COVID-19 test is in process. If negative and otherwise no acute changes and I would anticipate that she could proceed as planned.   VS: BP (!) 136/46   Pulse 77   Temp 37 C (Oral)   Resp 18   Ht 4' 10.5" (1.486 m)   Wt 84.1 kg   LMP 05/07/2005 (Approximate)   SpO2 98%   BMI 38.07 kg/m    PROVIDERS: Buzzy Han, MD is PCP  [name], MD is vascular surgeon   LABS: Preoperative labs noted. UA and WBC results reported to Levonne Spiller, RN with [facility] who will forward to Dr. Cyndia Bent for review.  (all labs ordered are listed, but only abnormal results are displayed)  Labs Reviewed  SURGICAL PCR SCREEN - Abnormal; Notable for the following components:      Result Value   MRSA, PCR POSITIVE (*)    Staphylococcus aureus POSITIVE (*)    All other components within normal limits  GLUCOSE, CAPILLARY - Abnormal; Notable for the following components:   Glucose-Capillary 112 (*)    All other components within normal limits  BLOOD GAS, ARTERIAL - Abnormal; Notable for the following components:   pO2, Arterial 76.4 (*)    All other  components within normal limits  CBC - Abnormal; Notable for the following components:   WBC 13.9 (*)    All other components within normal limits  COMPREHENSIVE METABOLIC PANEL - Abnormal; Notable for the following components:   CO2 21 (*)    Glucose, Bld 128 (*)    Creatinine, Ser 1.01 (*)    Calcium 8.8 (*)    Alkaline Phosphatase 140 (*)    GFR calc non Af Amer 58 (*)    Anion gap 16 (*)    All other components within normal limits  HEMOGLOBIN A1C - Abnormal; Notable for the following components:   Hgb A1c MFr Bld 6.3 (*)    All other components within normal limits  URINALYSIS, ROUTINE W REFLEX MICROSCOPIC - Abnormal; Notable for the following components:   Color, Urine STRAW (*)    APPearance HAZY (*)    Leukocytes,Ua LARGE (*)    Bacteria, UA RARE (*)    Non Squamous Epithelial 0-5 (*)    All other components within normal limits  APTT  PROTIME-INR  TYPE AND SCREEN  ABO/RH     IMAGES: CXR 05/14/19: FINDINGS: The heart size and mediastinal contours are within normal limits. Both lungs are clear. Previously noted right lung base opacity has cleared. Aortic atherosclerosis. The visualized skeletal structures are unremarkable. IMPRESSION: No active cardiopulmonary disease.  CTA neck 03/20/19: IMPRESSION: - Aortic atherosclerosis. - Soft plaque at the innominate artery origin. Lumen measures only 2  mm. The vessel regains a normal caliber 1.5 cm beyond its origin. - Atherosclerotic disease at the right carotid bifurcation but no additional stenosis. Beginning at the level of the distal bulb, the right ICA shows a markedly narrowed diameter measuring 1.5 mm and keeps this size to and through the skull base. Intracranial study was not performed. This size could be secondary to flow restriction at the proximal innominate artery but could also herald the presence of intracranial disease including occlusion of the supraclinoid ICA. - Atherosclerotic change at the left  carotid bifurcation and ICA bulb but without stenosis. - No vertebral artery disease of significance.  CTA chest 03/20/19: IMPRESSION: - Severe stenosis is seen involving the origin of the right innominate artery secondary to calcified plaque. Poststenotic dilatation is noted. There also appears to be mild stenosis involving the origin of the left common carotid artery secondary to eccentric calcified plaque. - Aortic Atherosclerosis (ICD10-I70.0).   EKG: 04/23/19: Normal sinus rhythm Cannot rule out anterior infarct, age undetermined Abnormal ECG   CV: Echo 04/22/19: IMPRESSIONS  1. Left ventricular ejection fraction, by visual estimation, is 70 to 75%. The left ventricle has hyperdynamic function. There is mildly basal septal hypertrophy.  2. The left ventricle has no regional wall motion abnormalities.  3. Global right ventricle has normal systolic function.The right ventricular size is normal. No increase in right ventricular wall thickness.  4. Left atrial size was mildly dilated.  5. The aortic valve is probably tricuspid. Aortic valve regurgitation is trivial. Mild to moderate aortic valve stenosis.  6. Right atrial size was normal.  7. The tricuspid valve is normal in structure. Tricuspid valve regurgitation is trivial.  8. The pulmonic valve was grossly normal. Pulmonic valve regurgitation is trivial.  9. Left ventricular diastolic parameters are consistent with Grade I diastolic dysfunction (impaired relaxation). 10. The mitral valve is normal in structure. Trivial mitral valve regurgitation.   CT Coronary 04/17/19: IMPRESSION: 1. Coronary calcium score of 140. This was 29 percentile for age and sex matched control. 2. Normal coronary origin with left dominance. 3. CAD-RADS 2. Mild non-obstructive CAD (25-49%) in the proximal LAD. Consider preventive therapy and risk factor modification.   Carotid US 03/11/19: Summary: Right Carotid: Innominate artery  stenosis. Flow reversal noted in the internal                carotid artery. Left Carotid: Velocities in the left ICA are consistent with a 40-59% stenosis. Vertebrals:  Left vertebral artery demonstrates antegrade flow. Right vertebral              artery demonstrates retrograde flow. Subclavians: Right subclavian artery was stenotic. Normal flow hemodynamics were              seen in the left subclavian artery.   Past Medical History:  Diagnosis Date  . Aortoiliac occlusive disease ([place]) 12/03/2016  . Arthritis   . Diabetes mellitus without complication ([place])    per pt she is pre- diabetic  . Diverticulitis   . Heart murmur   . Hypertension   . Osteoporosis   . Sleep apnea   . Tobacco use disorder 07/04/2012    Past Surgical History:  Procedure Laterality Date  . ABDOMINAL AORTOGRAM W/LOWER EXTREMITY N/A 12/03/2016   Procedure: Abdominal Aortogram w/Lower Extremity;  Surgeon: Angelia Mould, MD;  Location: [facility];  Service: Cardiovascular;  Laterality: N/A;  . DILATATION & CURETTAGE/HYSTEROSCOPY WITH MYOSURE N/A 12/27/2015   Procedure: DILATATION & CURETTAGE/HYSTEROSCOPY;  Surgeon:  Brook Oletta Lamas, MD;  Location: [facility];  Service: Gynecology;  Laterality: N/A;  . DILATION AND CURETTAGE OF UTERUS  11/2015  . ELBOW SURGERY Left ~2007   tendon repair by Dr. Veverly Fells  . EYE SURGERY Bilateral 2008   lasik  . JOINT REPLACEMENT    . lower aortic bypass  2000   per patient  . PERIPHERAL VASCULAR INTERVENTION  12/03/2016   Procedure: Peripheral Vascular Intervention;  Surgeon: Angelia Mould, MD;  Location: [facility];  Service: Cardiovascular;;  . TOTAL HIP ARTHROPLASTY Right 07/27/2014   Procedure: RIGHT TOTAL HIP ARTHROPLASTY ANTERIOR APPROACH;  Surgeon: Paralee Cancel, MD;  Location: [facility];  Service: Orthopedics;  Laterality: Right;    MEDICATIONS: . aspirin 81 MG chewable tablet  . atenolol-chlorthalidone (TENORETIC) 50-25 MG tablet  .  atorvastatin (LIPITOR) 80 MG tablet  . furosemide (LASIX) 20 MG tablet  . gabapentin (NEURONTIN) 100 MG capsule  . hydrOXYzine (ATARAX/VISTARIL) 25 MG tablet  . Lancets MISC  . losartan (COZAAR) 25 MG tablet  . metFORMIN (GLUCOPHAGE) 500 MG tablet  . Omega-3 Fatty Acids (OMEGA 3 PO)  . traZODone (DESYREL) 50 MG tablet   No current facility-administered medications for this encounter.   Derrill Memo ON 05/18/2019] cefUROXime (ZINACEF) 1.5 g in sodium chloride 0.9 % 100 mL IVPB  . [START ON 05/18/2019] cefUROXime (ZINACEF) 750 mg in sodium chloride 0.9 % 100 mL IVPB  . [START ON 05/18/2019] dexmedetomidine (PRECEDEX) 400 MCG/100ML (4 mcg/mL) infusion  . [START ON 05/18/2019] EPINEPHrine (ADRENALIN) 4 mg in dextrose 5 % 250 mL (0.016 mg/mL) infusion  . [START ON 05/18/2019] heparin 2,500 Units, papaverine 30 mg in electrolyte-148 (PLASMALYTE-148) 500 mL irrigation  . [START ON 05/18/2019] heparin 30,000 units/NS 1000 mL solution for CELLSAVER  . [START ON 05/18/2019] insulin regular, human (MYXREDLIN) 100 units/ 100 mL infusion  . [START ON 05/18/2019] magnesium sulfate (IV Push/IM) injection 40 mEq  . [START ON 05/18/2019] milrinone (PRIMACOR) 20 MG/100 ML (0.2 mg/mL) infusion  . [START ON 05/18/2019] nitroGLYCERIN 50 mg in dextrose 5 % 250 mL (0.2 mg/mL) infusion  . [START ON 05/18/2019] norepinephrine (LEVOPHED) 4mg  in 271mL premix infusion  . [START ON 05/18/2019] phenylephrine (NEOSYNEPHRINE) 20-0.9 MG/250ML-% infusion  . [START ON 05/18/2019] potassium chloride injection 80 mEq  . [START ON 05/18/2019] tranexamic acid (CYKLOKAPRON) 2,500 mg in sodium chloride 0.9 % 250 mL (10 mg/mL) infusion  . [START ON 05/18/2019] tranexamic acid (CYKLOKAPRON) bolus via infusion - over 30 minutes 1,261.5 mg  . [START ON 05/18/2019] tranexamic acid (CYKLOKAPRON) pump prime solution 168 mg  . [START ON 05/18/2019] vancomycin (VANCOREADY) IVPB 1500 mg/300 mL  PAT RN instructed her to follow-up with [facility] regarding  perioperative ASA instructions.   Myra Gianotti, PA-C Surgical Short Stay/Anesthesiology [facility] Phone [phone] [facility] Phone [phone] 05/15/2019 10:33 AM

## 2019-05-16 LAB — NOVEL CORONAVIRUS, NAA (HOSP ORDER, SEND-OUT TO REF LAB; TAT 18-24 HRS): SARS-CoV-2, NAA: NOT DETECTED

## 2019-05-18 ENCOUNTER — Inpatient Hospital Stay (HOSPITAL_COMMUNITY): Payer: Medicare Other | Admitting: Anesthesiology

## 2019-05-18 ENCOUNTER — Other Ambulatory Visit: Payer: Self-pay

## 2019-05-18 ENCOUNTER — Inpatient Hospital Stay (HOSPITAL_COMMUNITY)
Admission: RE | Admit: 2019-05-18 | Discharge: 2019-05-21 | DRG: 271 | Disposition: A | Discharge status: Home / Self Care | Payer: Medicare Other | Attending: Vascular Surgery | Admitting: Vascular Surgery

## 2019-05-18 ENCOUNTER — Inpatient Hospital Stay (HOSPITAL_COMMUNITY): Payer: Medicare Other | Admitting: Vascular Surgery

## 2019-05-18 ENCOUNTER — Inpatient Hospital Stay (HOSPITAL_COMMUNITY): Payer: Medicare Other

## 2019-05-18 ENCOUNTER — Encounter (HOSPITAL_COMMUNITY): Payer: Self-pay | Admitting: Surgery

## 2019-05-18 ENCOUNTER — Encounter (HOSPITAL_COMMUNITY)
Admission: RE | Disposition: A | Discharge status: Home / Self Care | Payer: Self-pay | Source: Home / Self Care | Attending: Vascular Surgery

## 2019-05-18 DIAGNOSIS — I2510 Atherosclerotic heart disease of native coronary artery without angina pectoris: Secondary | ICD-10-CM | POA: Diagnosis present

## 2019-05-18 DIAGNOSIS — E1151 Type 2 diabetes mellitus with diabetic peripheral angiopathy without gangrene: Secondary | ICD-10-CM | POA: Diagnosis present

## 2019-05-18 DIAGNOSIS — Z7984 Long term (current) use of oral hypoglycemic drugs: Secondary | ICD-10-CM

## 2019-05-18 DIAGNOSIS — J449 Chronic obstructive pulmonary disease, unspecified: Secondary | ICD-10-CM | POA: Diagnosis present

## 2019-05-18 DIAGNOSIS — I6522 Occlusion and stenosis of left carotid artery: Secondary | ICD-10-CM | POA: Diagnosis present

## 2019-05-18 DIAGNOSIS — Z7982 Long term (current) use of aspirin: Secondary | ICD-10-CM | POA: Diagnosis not present

## 2019-05-18 DIAGNOSIS — Z4682 Encounter for fitting and adjustment of non-vascular catheter: Secondary | ICD-10-CM

## 2019-05-18 DIAGNOSIS — K08109 Complete loss of teeth, unspecified cause, unspecified class: Secondary | ICD-10-CM | POA: Diagnosis present

## 2019-05-18 DIAGNOSIS — K219 Gastro-esophageal reflux disease without esophagitis: Secondary | ICD-10-CM | POA: Diagnosis present

## 2019-05-18 DIAGNOSIS — Z6837 Body mass index (BMI) 37.0-37.9, adult: Secondary | ICD-10-CM

## 2019-05-18 DIAGNOSIS — M1990 Unspecified osteoarthritis, unspecified site: Secondary | ICD-10-CM | POA: Diagnosis present

## 2019-05-18 DIAGNOSIS — I350 Nonrheumatic aortic (valve) stenosis: Secondary | ICD-10-CM | POA: Diagnosis present

## 2019-05-18 DIAGNOSIS — I708 Atherosclerosis of other arteries: Principal | ICD-10-CM | POA: Diagnosis present

## 2019-05-18 DIAGNOSIS — J9811 Atelectasis: Secondary | ICD-10-CM | POA: Diagnosis not present

## 2019-05-18 DIAGNOSIS — M810 Age-related osteoporosis without current pathological fracture: Secondary | ICD-10-CM | POA: Diagnosis present

## 2019-05-18 DIAGNOSIS — J432 Centrilobular emphysema: Secondary | ICD-10-CM | POA: Diagnosis present

## 2019-05-18 DIAGNOSIS — I10 Essential (primary) hypertension: Secondary | ICD-10-CM | POA: Diagnosis present

## 2019-05-18 DIAGNOSIS — G4730 Sleep apnea, unspecified: Secondary | ICD-10-CM | POA: Diagnosis present

## 2019-05-18 DIAGNOSIS — Z885 Allergy status to narcotic agent status: Secondary | ICD-10-CM | POA: Diagnosis not present

## 2019-05-18 DIAGNOSIS — Z79899 Other long term (current) drug therapy: Secondary | ICD-10-CM

## 2019-05-18 DIAGNOSIS — I771 Stricture of artery: Secondary | ICD-10-CM

## 2019-05-18 DIAGNOSIS — Z87891 Personal history of nicotine dependence: Secondary | ICD-10-CM

## 2019-05-18 DIAGNOSIS — G4733 Obstructive sleep apnea (adult) (pediatric): Secondary | ICD-10-CM | POA: Diagnosis present

## 2019-05-18 DIAGNOSIS — I700 Atherosclerosis of aorta: Secondary | ICD-10-CM | POA: Diagnosis present

## 2019-05-18 HISTORY — PX: STERNOTOMY: SHX1057

## 2019-05-18 HISTORY — PX: OTHER SURGICAL HISTORY: SHX169

## 2019-05-18 HISTORY — PX: AORTA -INNOMIATE BYPASS: SHX5723

## 2019-05-18 LAB — POCT I-STAT 7, (LYTES, BLD GAS, ICA,H+H)
Acid-base deficit: 2 mmol/L (ref 0.0–2.0)
Bicarbonate: 26.2 mmol/L (ref 20.0–28.0)
Bicarbonate: 24.8 mmol/L (ref 20.0–28.0)
Calcium, Ion: 1.09 mmol/L — ABNORMAL LOW (ref 1.15–1.40)
Calcium, Ion: 1.19 mmol/L (ref 1.15–1.40)
HCT: 27 % — ABNORMAL LOW (ref 36.0–46.0)
HCT: 28 % — ABNORMAL LOW (ref 36.0–46.0)
Hemoglobin: 9.2 g/dL — ABNORMAL LOW (ref 12.0–15.0)
Hemoglobin: 9.5 g/dL — ABNORMAL LOW (ref 12.0–15.0)
O2 Saturation: 95 %
O2 Saturation: 97 %
Patient temperature: 36.2
Patient temperature: 36.4
Potassium: 4.9 mmol/L (ref 3.5–5.1)
Potassium: 4.3 mmol/L (ref 3.5–5.1)
Sodium: 138 mmol/L (ref 135–145)
Sodium: 137 mmol/L (ref 135–145)
TCO2: 28 mmol/L (ref 22–32)
TCO2: 26 mmol/L (ref 22–32)
pCO2 arterial: 46.9 mmHg (ref 32.0–48.0)
pCO2 arterial: 53 mmHg — ABNORMAL HIGH (ref 32.0–48.0)
pH, Arterial: 7.351 (ref 7.350–7.450)
pH, Arterial: 7.275 — ABNORMAL LOW (ref 7.350–7.450)
pO2, Arterial: 79 mmHg — ABNORMAL LOW (ref 83.0–108.0)
pO2, Arterial: 97 mmHg (ref 83.0–108.0)

## 2019-05-18 LAB — BASIC METABOLIC PANEL
Anion gap: 10 (ref 5–15)
BUN: 13 mg/dL (ref 8–23)
CO2: 23 mmol/L (ref 22–32)
Calcium: 8.5 mg/dL — ABNORMAL LOW (ref 8.9–10.3)
Chloride: 102 mmol/L (ref 98–111)
Creatinine, Ser: 1.25 mg/dL — ABNORMAL HIGH (ref 0.44–1.00)
GFR calc Af Amer: 52 mL/min — ABNORMAL LOW (ref >60)
GFR calc non Af Amer: 45 mL/min — ABNORMAL LOW (ref >60)
Glucose, Bld: 143 mg/dL — ABNORMAL HIGH (ref 70–99)
Potassium: 4.2 mmol/L (ref 3.5–5.1)
Sodium: 135 mmol/L (ref 135–145)

## 2019-05-18 LAB — GLUCOSE, CAPILLARY
Glucose-Capillary: 141 mg/dL — ABNORMAL HIGH (ref 70–99)
Glucose-Capillary: 135 mg/dL — ABNORMAL HIGH (ref 70–99)
Glucose-Capillary: 131 mg/dL — ABNORMAL HIGH (ref 70–99)
Glucose-Capillary: 129 mg/dL — ABNORMAL HIGH (ref 70–99)
Glucose-Capillary: 114 mg/dL — ABNORMAL HIGH (ref 70–99)
Glucose-Capillary: 116 mg/dL — ABNORMAL HIGH (ref 70–99)
Glucose-Capillary: 128 mg/dL — ABNORMAL HIGH (ref 70–99)
Glucose-Capillary: 137 mg/dL — ABNORMAL HIGH (ref 70–99)
Glucose-Capillary: 137 mg/dL — ABNORMAL HIGH (ref 70–99)
Glucose-Capillary: 134 mg/dL — ABNORMAL HIGH (ref 70–99)
Glucose-Capillary: 145 mg/dL — ABNORMAL HIGH (ref 70–99)
Glucose-Capillary: 141 mg/dL — ABNORMAL HIGH (ref 70–99)
Glucose-Capillary: 126 mg/dL — ABNORMAL HIGH (ref 70–99)
Glucose-Capillary: 140 mg/dL — ABNORMAL HIGH (ref 70–99)

## 2019-05-18 LAB — CBC
HCT: 29.9 % — ABNORMAL LOW (ref 36.0–46.0)
Hemoglobin: 9.8 g/dL — ABNORMAL LOW (ref 12.0–15.0)
MCH: 29.8 pg (ref 26.0–34.0)
MCHC: 32.8 g/dL (ref 30.0–36.0)
MCV: 90.9 fL (ref 80.0–100.0)
Platelets: 379 10*3/uL (ref 150–400)
RBC: 3.29 MIL/uL — ABNORMAL LOW (ref 3.87–5.11)
RDW: 13.5 % (ref 11.5–15.5)
WBC: 18.4 10*3/uL — ABNORMAL HIGH (ref 4.0–10.5)
nRBC: 0 % (ref 0.0–0.2)

## 2019-05-18 LAB — ECHO INTRAOPERATIVE TEE
Height: 59 in
Weight: 2944 oz

## 2019-05-18 LAB — MAGNESIUM: Magnesium: 3.1 mg/dL — ABNORMAL HIGH (ref 1.7–2.4)

## 2019-05-18 LAB — PROTIME-INR
INR: 1.1 (ref 0.8–1.2)
Prothrombin Time: 14 seconds (ref 11.4–15.2)

## 2019-05-18 LAB — APTT: aPTT: 30 seconds (ref 24–36)

## 2019-05-18 SURGERY — CREATION, BYPASS, ARTERIAL, AORTA TO BRACHIOCEPHALIC
Anesthesia: General | Site: Chest

## 2019-05-18 MED ORDER — METOPROLOL TARTRATE 12.5 MG HALF TABLET
ORAL_TABLET | ORAL | Status: AC
  Administered 2019-05-18: 12.5 mg via ORAL
  Filled 2019-05-18: qty 1

## 2019-05-18 MED ORDER — BISACODYL 5 MG PO TBEC
10.0000 mg | DELAYED_RELEASE_TABLET | Freq: Every day | ORAL | Status: DC
  Administered 2019-05-19 – 2019-05-21 (×3): 10 mg via ORAL
  Filled 2019-05-18 (×3): qty 2

## 2019-05-18 MED ORDER — SODIUM CHLORIDE 0.9 % IV SOLN
INTRAVENOUS | Status: DC | PRN
  Administered 2019-05-18: 500 mL

## 2019-05-18 MED ORDER — FUROSEMIDE 20 MG PO TABS
20.0000 mg | ORAL_TABLET | Freq: Every morning | ORAL | Status: DC
  Administered 2019-05-19 – 2019-05-21 (×3): 20 mg via ORAL
  Filled 2019-05-18 (×3): qty 1

## 2019-05-18 MED ORDER — DOCUSATE SODIUM 100 MG PO CAPS
200.0000 mg | ORAL_CAPSULE | Freq: Every day | ORAL | Status: DC
  Administered 2019-05-19 – 2019-05-21 (×3): 200 mg via ORAL
  Filled 2019-05-18 (×3): qty 2

## 2019-05-18 MED ORDER — DEXMEDETOMIDINE HCL IN NACL 400 MCG/100ML IV SOLN: 0.0000 ug/kg/h | INTRAVENOUS | Status: DC

## 2019-05-18 MED ORDER — FENTANYL CITRATE (PF) 250 MCG/5ML IJ SOLN
INTRAMUSCULAR | Status: DC | PRN
  Administered 2019-05-18: 100 ug via INTRAVENOUS
  Administered 2019-05-18: 250 ug via INTRAVENOUS
  Administered 2019-05-18: 50 ug via INTRAVENOUS
  Administered 2019-05-18: 100 ug via INTRAVENOUS
  Administered 2019-05-18 (×2): 50 ug via INTRAVENOUS
  Administered 2019-05-18: 150 ug via INTRAVENOUS
  Administered 2019-05-18 (×2): 100 ug via INTRAVENOUS
  Administered 2019-05-18 (×2): 250 ug via INTRAVENOUS
  Administered 2019-05-18: 50 ug via INTRAVENOUS
  Administered 2019-05-18: 150 ug via INTRAVENOUS

## 2019-05-18 MED ORDER — ASPIRIN 81 MG PO CHEW: 324.0000 mg | CHEWABLE_TABLET | Freq: Every day | ORAL | Status: DC

## 2019-05-18 MED ORDER — INSULIN REGULAR(HUMAN) IN NACL 100-0.9 UT/100ML-% IV SOLN: INTRAVENOUS | Status: DC

## 2019-05-18 MED ORDER — HEMOSTATIC AGENTS (NO CHARGE) OPTIME
TOPICAL | Status: DC | PRN
  Administered 2019-05-18 (×3): 1 via TOPICAL

## 2019-05-18 MED ORDER — ACETAMINOPHEN 160 MG/5ML PO SOLN: 1000.0000 mg | Freq: Four times a day (QID) | ORAL | Status: DC

## 2019-05-18 MED ORDER — PROTAMINE SULFATE 10 MG/ML IV SOLN
INTRAVENOUS | Status: AC
  Filled 2019-05-18: qty 10

## 2019-05-18 MED ORDER — MAGNESIUM SULFATE 4 GM/100ML IV SOLN
4.0000 g | Freq: Once | INTRAVENOUS | Status: AC
  Administered 2019-05-18: 4 g via INTRAVENOUS
  Filled 2019-05-18: qty 100

## 2019-05-18 MED ORDER — DEXAMETHASONE SODIUM PHOSPHATE 10 MG/ML IJ SOLN
INTRAMUSCULAR | Status: AC
  Filled 2019-05-18: qty 1

## 2019-05-18 MED ORDER — ATENOLOL-CHLORTHALIDONE 50-25 MG PO TABS: 1.0000 | ORAL_TABLET | Freq: Every morning | ORAL | Status: DC

## 2019-05-18 MED ORDER — VANCOMYCIN HCL IN DEXTROSE 1-5 GM/200ML-% IV SOLN: 1000.0000 mg | Freq: Once | INTRAVENOUS | Status: DC

## 2019-05-18 MED ORDER — CHLORHEXIDINE GLUCONATE 0.12 % MT SOLN
15.0000 mL | Freq: Once | OROMUCOSAL | Status: AC
  Administered 2019-05-19: 15 mL via OROMUCOSAL

## 2019-05-18 MED ORDER — DEXTROSE 50 % IV SOLN: 0.0000 mL | INTRAVENOUS | Status: DC | PRN

## 2019-05-18 MED ORDER — FENTANYL CITRATE (PF) 250 MCG/5ML IJ SOLN
INTRAMUSCULAR | Status: AC
  Filled 2019-05-18: qty 25

## 2019-05-18 MED ORDER — SODIUM CHLORIDE 0.9 % IV SOLN: 250.0000 mL | INTRAVENOUS | Status: DC

## 2019-05-18 MED ORDER — CHLORTHALIDONE 25 MG PO TABS
25.0000 mg | ORAL_TABLET | Freq: Every day | ORAL | Status: DC
  Administered 2019-05-20 – 2019-05-21 (×2): 25 mg via ORAL
  Filled 2019-05-18 (×4): qty 1

## 2019-05-18 MED ORDER — LACTATED RINGERS IV SOLN: INTRAVENOUS | Status: DC | PRN

## 2019-05-18 MED ORDER — HEPARIN SODIUM (PORCINE) 1000 UNIT/ML IJ SOLN
INTRAMUSCULAR | Status: AC
  Filled 2019-05-18: qty 1

## 2019-05-18 MED ORDER — LOSARTAN POTASSIUM 25 MG PO TABS
25.0000 mg | ORAL_TABLET | Freq: Every evening | ORAL | Status: DC
  Administered 2019-05-19 – 2019-05-20 (×2): 25 mg via ORAL
  Filled 2019-05-18 (×2): qty 1

## 2019-05-18 MED ORDER — SODIUM CHLORIDE 0.9 % IV SOLN
1.5000 g | Freq: Two times a day (BID) | INTRAVENOUS | Status: AC
  Administered 2019-05-18 – 2019-05-20 (×4): 1.5 g via INTRAVENOUS
  Filled 2019-05-18 (×5): qty 1.5

## 2019-05-18 MED ORDER — CHLORHEXIDINE GLUCONATE 0.12 % MT SOLN
15.0000 mL | OROMUCOSAL | Status: AC
  Administered 2019-05-18: 15 mL via OROMUCOSAL

## 2019-05-18 MED ORDER — LACTATED RINGERS IV SOLN: 500.0000 mL | Freq: Once | INTRAVENOUS | Status: DC | PRN

## 2019-05-18 MED ORDER — CHLORHEXIDINE GLUCONATE 0.12% ORAL RINSE (MEDLINE KIT): 15.0000 mL | Freq: Two times a day (BID) | OROMUCOSAL | Status: DC

## 2019-05-18 MED ORDER — SODIUM CHLORIDE 0.9% FLUSH
10.0000 mL | Freq: Two times a day (BID) | INTRAVENOUS | Status: DC
  Administered 2019-05-18 (×2): 10 mL

## 2019-05-18 MED ORDER — METOPROLOL TARTRATE 12.5 MG HALF TABLET: 12.5000 mg | ORAL_TABLET | Freq: Once | ORAL | Status: AC

## 2019-05-18 MED ORDER — SUCCINYLCHOLINE CHLORIDE 200 MG/10ML IV SOSY
PREFILLED_SYRINGE | INTRAVENOUS | Status: AC
  Filled 2019-05-18: qty 10

## 2019-05-18 MED ORDER — THROMBIN (RECOMBINANT) 20000 UNITS EX SOLR
CUTANEOUS | Status: AC
  Filled 2019-05-18: qty 20000

## 2019-05-18 MED ORDER — NITROGLYCERIN IN D5W 200-5 MCG/ML-% IV SOLN: 0.0000 ug/min | INTRAVENOUS | Status: DC

## 2019-05-18 MED ORDER — PANTOPRAZOLE SODIUM 40 MG PO TBEC
40.0000 mg | DELAYED_RELEASE_TABLET | Freq: Every day | ORAL | Status: DC
  Administered 2019-05-20 – 2019-05-21 (×2): 40 mg via ORAL
  Filled 2019-05-18 (×2): qty 1

## 2019-05-18 MED ORDER — ROCURONIUM BROMIDE 10 MG/ML (PF) SYRINGE
PREFILLED_SYRINGE | INTRAVENOUS | Status: AC
  Filled 2019-05-18: qty 10

## 2019-05-18 MED ORDER — LACTATED RINGERS IV SOLN: INTRAVENOUS | Status: DC

## 2019-05-18 MED ORDER — ROCURONIUM BROMIDE 10 MG/ML (PF) SYRINGE
PREFILLED_SYRINGE | INTRAVENOUS | Status: DC | PRN
  Administered 2019-05-18: 80 mg via INTRAVENOUS
  Administered 2019-05-18: 20 mg via INTRAVENOUS

## 2019-05-18 MED ORDER — CHLORHEXIDINE GLUCONATE 4 % EX LIQD: 30.0000 mL | CUTANEOUS | Status: DC

## 2019-05-18 MED ORDER — SODIUM CHLORIDE 0.9% FLUSH: 10.0000 mL | INTRAVENOUS | Status: DC | PRN

## 2019-05-18 MED ORDER — FAMOTIDINE IN NACL 20-0.9 MG/50ML-% IV SOLN
20.0000 mg | Freq: Two times a day (BID) | INTRAVENOUS | Status: AC
  Administered 2019-05-18: 20 mg via INTRAVENOUS

## 2019-05-18 MED ORDER — SODIUM CHLORIDE (PF) 0.9 % IJ SOLN
OROMUCOSAL | Status: DC | PRN
  Administered 2019-05-18 (×3): 4 mL via TOPICAL

## 2019-05-18 MED ORDER — ORAL CARE MOUTH RINSE
15.0000 mL | OROMUCOSAL | Status: DC
  Administered 2019-05-18: 15 mL via OROMUCOSAL

## 2019-05-18 MED ORDER — ONDANSETRON HCL 4 MG/2ML IJ SOLN
4.0000 mg | Freq: Four times a day (QID) | INTRAMUSCULAR | Status: DC | PRN
  Administered 2019-05-18: 4 mg via INTRAVENOUS
  Filled 2019-05-18: qty 2

## 2019-05-18 MED ORDER — LIDOCAINE 2% (20 MG/ML) 5 ML SYRINGE
INTRAMUSCULAR | Status: AC
  Filled 2019-05-18: qty 5

## 2019-05-18 MED ORDER — ONDANSETRON HCL 4 MG/2ML IJ SOLN
INTRAMUSCULAR | Status: AC
  Filled 2019-05-18: qty 2

## 2019-05-18 MED ORDER — HYDROXYZINE HCL 25 MG PO TABS
25.0000 mg | ORAL_TABLET | Freq: Two times a day (BID) | ORAL | Status: DC
  Administered 2019-05-19 – 2019-05-21 (×5): 25 mg via ORAL
  Filled 2019-05-18 (×5): qty 1

## 2019-05-18 MED ORDER — SODIUM CHLORIDE 0.9% FLUSH: 3.0000 mL | Freq: Two times a day (BID) | INTRAVENOUS | Status: DC

## 2019-05-18 MED ORDER — BISACODYL 10 MG RE SUPP: 10.0000 mg | Freq: Every day | RECTAL | Status: DC

## 2019-05-18 MED ORDER — ATORVASTATIN CALCIUM 80 MG PO TABS
80.0000 mg | ORAL_TABLET | Freq: Every day | ORAL | Status: DC
  Administered 2019-05-19 – 2019-05-20 (×2): 80 mg via ORAL
  Filled 2019-05-18 (×2): qty 1

## 2019-05-18 MED ORDER — ACETAMINOPHEN 160 MG/5ML PO SOLN: 650.0000 mg | Freq: Once | ORAL | Status: DC

## 2019-05-18 MED ORDER — TRAZODONE HCL 50 MG PO TABS
50.0000 mg | ORAL_TABLET | Freq: Every day | ORAL | Status: DC
  Administered 2019-05-18 – 2019-05-20 (×3): 50 mg via ORAL
  Filled 2019-05-18 (×3): qty 1

## 2019-05-18 MED ORDER — TRAMADOL HCL 50 MG PO TABS: 50.0000 mg | ORAL_TABLET | Freq: Four times a day (QID) | ORAL | Status: DC | PRN

## 2019-05-18 MED ORDER — METOCLOPRAMIDE HCL 5 MG/ML IJ SOLN
10.0000 mg | Freq: Four times a day (QID) | INTRAMUSCULAR | Status: AC
  Administered 2019-05-18 – 2019-05-20 (×7): 10 mg via INTRAVENOUS
  Filled 2019-05-18 (×6): qty 2

## 2019-05-18 MED ORDER — GABAPENTIN 100 MG PO CAPS
100.0000 mg | ORAL_CAPSULE | Freq: Three times a day (TID) | ORAL | Status: DC
  Administered 2019-05-18 – 2019-05-21 (×8): 100 mg via ORAL
  Filled 2019-05-18 (×8): qty 1

## 2019-05-18 MED ORDER — HEPARIN SODIUM (PORCINE) 1000 UNIT/ML IJ SOLN
INTRAMUSCULAR | Status: DC | PRN
  Administered 2019-05-18: 3000 [IU] via INTRAVENOUS
  Administered 2019-05-18: 9000 [IU] via INTRAVENOUS
  Administered 2019-05-18: 3000 [IU] via INTRAVENOUS

## 2019-05-18 MED ORDER — CHLORHEXIDINE GLUCONATE CLOTH 2 % EX PADS
6.0000 | MEDICATED_PAD | Freq: Every day | CUTANEOUS | Status: DC
  Administered 2019-05-18: 6 via TOPICAL

## 2019-05-18 MED ORDER — FENTANYL CITRATE (PF) 100 MCG/2ML IJ SOLN
50.0000 ug | INTRAMUSCULAR | Status: DC | PRN
  Administered 2019-05-18 – 2019-05-19 (×6): 50 ug via INTRAVENOUS
  Filled 2019-05-18 (×7): qty 2

## 2019-05-18 MED ORDER — ORAL CARE MOUTH RINSE
15.0000 mL | Freq: Two times a day (BID) | OROMUCOSAL | Status: DC
  Administered 2019-05-18 – 2019-05-20 (×2): 15 mL via OROMUCOSAL

## 2019-05-18 MED ORDER — SUCCINYLCHOLINE CHLORIDE 20 MG/ML IJ SOLN
INTRAMUSCULAR | Status: DC | PRN
  Administered 2019-05-18: 120 mg via INTRAVENOUS

## 2019-05-18 MED ORDER — PROTAMINE SULFATE 10 MG/ML IV SOLN
INTRAVENOUS | Status: DC | PRN
  Administered 2019-05-18: 100 mg via INTRAVENOUS

## 2019-05-18 MED ORDER — SODIUM CHLORIDE 0.9 % IR SOLN
Status: DC | PRN
  Administered 2019-05-18: 4000 mL

## 2019-05-18 MED ORDER — EPHEDRINE SULFATE 50 MG/ML IJ SOLN
INTRAMUSCULAR | Status: DC | PRN
  Administered 2019-05-18: 10 mg via INTRAVENOUS

## 2019-05-18 MED ORDER — POTASSIUM CHLORIDE 10 MEQ/50ML IV SOLN
10.0000 meq | INTRAVENOUS | Status: AC
  Administered 2019-05-18 (×3): 10 meq via INTRAVENOUS

## 2019-05-18 MED ORDER — ATENOLOL 25 MG PO TABS
50.0000 mg | ORAL_TABLET | Freq: Every day | ORAL | Status: DC
  Administered 2019-05-19 – 2019-05-21 (×3): 50 mg via ORAL
  Filled 2019-05-18 (×3): qty 2

## 2019-05-18 MED ORDER — KETOROLAC TROMETHAMINE 15 MG/ML IJ SOLN
15.0000 mg | Freq: Four times a day (QID) | INTRAMUSCULAR | Status: AC | PRN
  Administered 2019-05-19: 15 mg via INTRAVENOUS
  Filled 2019-05-18: qty 1

## 2019-05-18 MED ORDER — MIDAZOLAM HCL 5 MG/5ML IJ SOLN
INTRAMUSCULAR | Status: DC | PRN
  Administered 2019-05-18 (×2): 2 mg via INTRAVENOUS
  Administered 2019-05-18 (×2): 1 mg via INTRAVENOUS

## 2019-05-18 MED ORDER — FENTANYL CITRATE (PF) 250 MCG/5ML IJ SOLN
INTRAMUSCULAR | Status: AC
  Filled 2019-05-18: qty 5

## 2019-05-18 MED ORDER — ARTIFICIAL TEARS OPHTHALMIC OINT
TOPICAL_OINTMENT | OPHTHALMIC | Status: DC | PRN
  Administered 2019-05-18: 1 via OPHTHALMIC

## 2019-05-18 MED ORDER — SODIUM CHLORIDE 0.45 % IV SOLN: INTRAVENOUS | Status: DC | PRN

## 2019-05-18 MED ORDER — MIDAZOLAM HCL (PF) 10 MG/2ML IJ SOLN
INTRAMUSCULAR | Status: AC
  Filled 2019-05-18: qty 2

## 2019-05-18 MED ORDER — THROMBIN 20000 UNITS EX SOLR
CUTANEOUS | Status: DC | PRN
  Administered 2019-05-18: 20000 [IU] via TOPICAL

## 2019-05-18 MED ORDER — SODIUM CHLORIDE 0.9% FLUSH
3.0000 mL | INTRAVENOUS | Status: DC | PRN
  Administered 2019-05-19: 3 mL via INTRAVENOUS

## 2019-05-18 MED ORDER — PROPOFOL 10 MG/ML IV BOLUS
INTRAVENOUS | Status: DC | PRN
  Administered 2019-05-18 (×2): 50 mg via INTRAVENOUS

## 2019-05-18 MED ORDER — ACETAMINOPHEN 500 MG PO TABS
1000.0000 mg | ORAL_TABLET | Freq: Four times a day (QID) | ORAL | Status: DC
  Administered 2019-05-18 – 2019-05-20 (×8): 1000 mg via ORAL
  Filled 2019-05-18 (×8): qty 2

## 2019-05-18 MED ORDER — ALBUMIN HUMAN 5 % IV SOLN: 250.0000 mL | INTRAVENOUS | Status: DC | PRN

## 2019-05-18 MED ORDER — SODIUM CHLORIDE 0.9 % IV SOLN: INTRAVENOUS | Status: DC

## 2019-05-18 MED ORDER — CHLORHEXIDINE GLUCONATE 0.12 % MT SOLN
OROMUCOSAL | Status: AC
  Administered 2019-05-18: 15 mL via OROMUCOSAL
  Filled 2019-05-18: qty 15

## 2019-05-18 MED ORDER — PHENYLEPHRINE HCL-NACL 20-0.9 MG/250ML-% IV SOLN: 0.0000 ug/min | INTRAVENOUS | Status: DC

## 2019-05-18 MED ORDER — LIDOCAINE 2% (20 MG/ML) 5 ML SYRINGE
INTRAMUSCULAR | Status: DC | PRN
  Administered 2019-05-18: 80 mg via INTRAVENOUS

## 2019-05-18 MED ORDER — SODIUM CHLORIDE 0.9 % IV SOLN
INTRAVENOUS | Status: AC
  Filled 2019-05-18: qty 1.2

## 2019-05-18 MED ORDER — PROPOFOL 10 MG/ML IV BOLUS
INTRAVENOUS | Status: AC
  Filled 2019-05-18: qty 20

## 2019-05-18 MED ORDER — ACETAMINOPHEN 650 MG RE SUPP: 650.0000 mg | Freq: Once | RECTAL | Status: DC

## 2019-05-18 MED ORDER — ASPIRIN EC 325 MG PO TBEC
325.0000 mg | DELAYED_RELEASE_TABLET | Freq: Every day | ORAL | Status: DC
  Administered 2019-05-19 – 2019-05-21 (×3): 325 mg via ORAL
  Filled 2019-05-18 (×3): qty 1

## 2019-05-18 SURGICAL SUPPLY — 89 items
APL SRG 7X2 LUM MLBL SLNT (VASCULAR PRODUCTS) ×3
APPLICATOR TIP COSEAL (VASCULAR PRODUCTS) ×3 IMPLANT
BLADE STERNUM SYSTEM 6 (BLADE) ×3 IMPLANT
BLADE SURG 15 STRL LF DISP TIS (BLADE) ×1 IMPLANT
BLADE SURG 15 STRL SS (BLADE) ×5
BRUSH SCRUB EZ PLAIN DRY (MISCELLANEOUS) ×9 IMPLANT
CABLE PACING FASLOC BIEGE (MISCELLANEOUS) ×3 IMPLANT
CABLE PACING FASLOC BLUE (MISCELLANEOUS) ×3 IMPLANT
CANISTER SUCT 3000ML PPV (MISCELLANEOUS) ×5 IMPLANT
CANNULA AORTIC ROOT 9FR (CANNULA) IMPLANT
CANNULA ARTERIAL VENT 3/8 20FR (CANNULA) IMPLANT
CANNULA GUNDRY RCSP 15FR (MISCELLANEOUS) IMPLANT
CANNULA MC2 2 STG 36/46 NON-V (CANNULA) IMPLANT
CANNULA VENOUS 2 STG 34/46 (CANNULA)
CATH HEART VENT LEFT (CATHETERS) IMPLANT
CATH THORACIC 28FR (CATHETERS) ×3 IMPLANT
CATH THORACIC 36FR (CATHETERS) IMPLANT
CLIP VESOCCLUDE MED 24/CT (CLIP) ×3 IMPLANT
CLIP VESOCCLUDE SM WIDE 24/CT (CLIP) ×3 IMPLANT
CONN ST 1/4X3/8  BEN (MISCELLANEOUS)
CONN ST 1/4X3/8 BEN (MISCELLANEOUS) IMPLANT
CONN Y 3/8X3/8X3/8  BEN (MISCELLANEOUS)
CONN Y 3/8X3/8X3/8 BEN (MISCELLANEOUS) IMPLANT
COUNTER NEEDLE 20 DBL MAG RED (NEEDLE) ×6 IMPLANT
COVER MAYO STAND STRL (DRAPES) ×3 IMPLANT
COVER WAND RF STERILE (DRAPES) ×2 IMPLANT
DRAIN CHANNEL 32F RND 10.7 FF (WOUND CARE) IMPLANT
DRAPE CARDIOVASCULAR INCISE (DRAPES)
DRAPE HALF SHEET 40X57 (DRAPES) ×9 IMPLANT
DRAPE INCISE IOBAN 66X45 STRL (DRAPES) ×10 IMPLANT
DRAPE SRG 135X102X78XABS (DRAPES) IMPLANT
DRAPE TABLE BACK 80X90 (DRAPES) ×3 IMPLANT
DRSG COVADERM 4X10 (GAUZE/BANDAGES/DRESSINGS) ×3 IMPLANT
DRSG COVADERM 4X14 (GAUZE/BANDAGES/DRESSINGS) ×3 IMPLANT
GAUZE SPONGE 4X4 12PLY STRL (GAUZE/BANDAGES/DRESSINGS) ×3 IMPLANT
GAUZE SPONGE 4X4 12PLY STRL LF (GAUZE/BANDAGES/DRESSINGS) ×3 IMPLANT
GLOVE BIO SURGEON STRL SZ 6.5 (GLOVE) ×4 IMPLANT
GLOVE BIO SURGEON STRL SZ7.5 (GLOVE) ×5 IMPLANT
GLOVE BIO SURGEONS STRL SZ 6.5 (GLOVE) ×2
GLOVE BIOGEL PI IND STRL 6.5 (GLOVE) ×1 IMPLANT
GLOVE BIOGEL PI IND STRL 8 (GLOVE) ×4 IMPLANT
GLOVE BIOGEL PI INDICATOR 6.5 (GLOVE) ×2
GLOVE BIOGEL PI INDICATOR 8 (GLOVE) ×4
GOWN STRL REUS W/ TWL LRG LVL3 (GOWN DISPOSABLE) ×12 IMPLANT
GOWN STRL REUS W/TWL LRG LVL3 (GOWN DISPOSABLE) ×30
GRAFT HEMASHIELD 8MM (Vascular Products) ×5 IMPLANT
GRAFT VASC STRG 30X8KNIT (Vascular Products) ×1 IMPLANT
HEMOSTAT POWDER SURGIFOAM 1G (HEMOSTASIS) ×11 IMPLANT
HEMOSTAT SURGICEL 2X14 (HEMOSTASIS) ×3 IMPLANT
INSERT FOGARTY SM (MISCELLANEOUS) ×3 IMPLANT
IV CATH 22GX1 FEP (IV SOLUTION) ×3 IMPLANT
KIT SUCTION CATH 14FR (SUCTIONS) IMPLANT
KIT TURNOVER KIT B (KITS) ×5 IMPLANT
LOOP VESSEL SUPERMAXI WHITE (MISCELLANEOUS) ×3 IMPLANT
NDL 27GX1/2 REG BEVEL ECLIP (NEEDLE) IMPLANT
NEEDLE 27GX1/2 REG BEVEL ECLIP (NEEDLE) ×5 IMPLANT
NS IRRIG 1000ML POUR BTL (IV SOLUTION) ×16 IMPLANT
PACK AORTA (CUSTOM PROCEDURE TRAY) ×5 IMPLANT
PAD ARMBOARD 7.5X6 YLW CONV (MISCELLANEOUS) ×10 IMPLANT
PAD CARDIAC INSULATION (MISCELLANEOUS) ×3 IMPLANT
PUNCH AORTIC ROTATE 4.5MM 8IN (MISCELLANEOUS) ×3 IMPLANT
SEALANT SURG COSEAL 8ML (VASCULAR PRODUCTS) ×3 IMPLANT
SENSOR MYOCARDIAL TEMP (MISCELLANEOUS) ×3 IMPLANT
STAPLER VISISTAT (STAPLE) IMPLANT
SUT ETHIBOND 2 0 SH (SUTURE) ×5
SUT ETHIBOND 2 0 SH 36X2 (SUTURE) ×1 IMPLANT
SUT PROLENE 3 0 SH 48 (SUTURE) ×6 IMPLANT
SUT PROLENE 3 0 SH DA (SUTURE) ×5 IMPLANT
SUT PROLENE 3 0 SH1 36 (SUTURE) ×6 IMPLANT
SUT PROLENE 4 0 RB 1 (SUTURE) ×20
SUT PROLENE 4-0 RB1 .5 CRCL 36 (SUTURE) ×8 IMPLANT
SUT PROLENE 5 0 C 1 24 (SUTURE) ×5 IMPLANT
SUT SILK  1 MH (SUTURE) ×6
SUT SILK 1 MH (SUTURE) ×9 IMPLANT
SUT SILK 2 0 SH CR/8 (SUTURE) ×5 IMPLANT
SUT STEEL 6MS V (SUTURE) ×16 IMPLANT
SUT VIC AB 1 CTX 18 (SUTURE) ×5 IMPLANT
SUT VIC AB 1 CTX 36 (SUTURE) ×5
SUT VIC AB 1 CTX36XBRD ANBCTR (SUTURE) ×1 IMPLANT
SUT VIC AB 2-0 CTX 36 (SUTURE) ×10 IMPLANT
SUT VIC AB 3-0 X1 27 (SUTURE) ×10 IMPLANT
SUT VICRYL 4-0 PS2 18IN ABS (SUTURE) IMPLANT
SYR BULB IRRIGATION 50ML (SYRINGE) ×6 IMPLANT
SYSTEM SAHARA CHEST DRAIN ATS (WOUND CARE) ×3 IMPLANT
TOWEL GREEN STERILE (TOWEL DISPOSABLE) ×5 IMPLANT
TRAY FOLEY MTR SLVR 16FR STAT (SET/KITS/TRAYS/PACK) ×5 IMPLANT
TUBE SUCT INTRACARD DLP 20F (MISCELLANEOUS) IMPLANT
VENT LEFT HEART 12002 (CATHETERS)
WATER STERILE IRR 1000ML POUR (IV SOLUTION) ×5 IMPLANT

## 2019-05-18 NOTE — Progress Notes (Signed)
Chaplain engaged in initial visit with Diane Giles and Diane Giles.  Chaplain offered support to Diane Giles and explained chaplain services.  Chaplain will continue to follow-up.

## 2019-05-18 NOTE — Anesthesia Postprocedure Evaluation (Signed)
Anesthesia Post Note  Patient: Ryleeann C Favorite  Procedure(s) Performed: AORTA -INNOMIATE BYPASS Using Hemashield Gold Graft Size 43mm (N/A Chest) Sternotomy (Chest)     Patient location during evaluation: SICU Anesthesia Type: General Level of consciousness: sedated and patient remains intubated per anesthesia plan Pain management: pain level controlled Vital Signs Assessment: post-procedure vital signs reviewed and stable Respiratory status: patient on ventilator - see flowsheet for VS and patient remains intubated per anesthesia plan (weaning from vent) Cardiovascular status: stable Postop Assessment: no apparent nausea or vomiting Anesthetic complications: no    Last Vitals:  Vitals:   05/18/19 1400 05/18/19 1415  BP: (!) 107/47 (!) 136/52  Pulse: 77 75  Resp: 12 13  Temp: (!) 36.4 C   SpO2: 96% 97%    Last Pain:  Vitals:   05/18/19 1100  TempSrc: Core  PainSc:                  [place],E. Nyanna Heideman

## 2019-05-18 NOTE — Progress Notes (Signed)
[facility] BRIEF SICU PROGRESS NOTE  Day of Surgery  S/P Procedure(s) (LRB): AORTA -INNOMIATE BYPASS Using Hemashield Gold Graft Size 33mm (N/A) Sternotomy   Resting comfortably NSR w/ stable BP  Plan: Continue current plan  Rexene Alberts, MD 05/18/2019 8:06 PM

## 2019-05-18 NOTE — Procedures (Signed)
Extubation Procedure Note  Patient Details:   Name: Diane Giles DOB: [birth year] MRN: [id number]   Airway Documentation:    Vent end date: 05/18/19 Vent end time: 1502   Evaluation  O2 sats: stable throughout Complications: No apparent complications Patient did tolerate procedure well. Bilateral Breath Sounds: Clear, Diminished   Yes   Patient was extubated to a 4L [place] without any complications, dyspnea or stridor noted. Patient was instructed on IS x 5, highest goal reached was 533mL. NIF: -24, VC: .8L, positive cuff leak.   [name], Eddie North 05/18/2019, 3:02 PM

## 2019-05-18 NOTE — Anesthesia Procedure Notes (Signed)
Central Venous Catheter Insertion Performed by: Annye Asa, MD, anesthesiologist Start/End1/03/2020 6:56 AM, 05/18/2019 7:09 AM Patient location: Pre-op. Preanesthetic checklist: patient identified, IV checked, site marked, risks and benefits discussed, surgical consent, monitors and equipment checked, pre-op evaluation, timeout performed and anesthesia consent Position: Trendelenburg Lidocaine 1% used for infiltration and patient sedated Hand hygiene performed , maximum sterile barriers used  and Seldinger technique used Catheter size: 7.5 Fr PA cath was placed.Sheath introducer Swan type:thermodilution Procedure performed using ultrasound guided technique. Ultrasound Notes:anatomy identified, needle tip was noted to be adjacent to the nerve/plexus identified, no ultrasound evidence of intravascular and/or intraneural injection and image(s) printed for medical record Attempts: 2 Following insertion, line sutured, dressing applied and Biopatch. Post procedure assessment: blood return through all ports, free fluid flow and no air  Post procedure complications: arterial puncture (first pass arterial, pressure held). Patient tolerated the procedure well with no immediate complications. Additional procedure comments: PA catheter:  Routine monitors. Timeout, sterile prep, drape, FBP L neck.  Trendelenburg position.  1% Lido local, finder and trocar LIJ, (after 1st pass arterial, pressure held) with US guidance.  Cordis placed over J wire. PA catheter in easily.  Sterile dressing applied.  Patient tolerated well, VSS.  Jenita Seashore, MD.

## 2019-05-18 NOTE — Transfer of Care (Signed)
Immediate Anesthesia Transfer of Care Note  Patient: Diane Giles  Procedure(s) Performed: AORTA -INNOMIATE BYPASS Using Hemashield Gold Graft Size 73mm (N/A Chest) Sternotomy (Chest)  Patient Location: SICU  Anesthesia Type:General  Level of Consciousness: awake, alert , oriented and patient cooperative  Airway & Oxygen Therapy: Patient remains intubated per anesthesia plan and Patient placed on Ventilator (see vital sign flow sheet for setting)  Post-op Assessment: Report given to RN and Post -op Vital signs reviewed and stable  Post vital signs: Reviewed and stable  Last Vitals:  Vitals Value Taken Time  BP    Temp    Pulse    Resp    SpO2      Last Pain:  Vitals:   05/18/19 0612  PainSc: 0-No pain         Complications: No apparent anesthesia complications

## 2019-05-18 NOTE — Op Note (Signed)
  CARDIOVASCULAR SURGERY OPERATIVE NOTE  05/18/2019  Co-Surgeons:  Gaye Pollack, MD ( Cardio-thoracic) and [name], MD (Vascular)  First Assistant: none   Preoperative Diagnosis:  Innominate artery stenosis   Postoperative Diagnosis:  Same   Procedure:  1. Median Sternotomy   Anesthesia:  General Endotracheal   Clinical History/Surgical Indication:  This 64 year old patient has high-grade proximal innominate artery stenosis with exertional fatigue and paresthesias in the right upper extremity as well as dizziness. The innominate artery regains a normal caliber of 1.5 cm beyond the stenosis. There is some atherosclerotic disease at the right carotid bifurcation but there does not appear to be significant stenosis. The right common carotid artery is relatively small and the right ICA measures 1.5 mm beginning at the level of the distal bulb and continuing through the skull base. Given her symptoms I think it is advisable to perform aorto innominate bypass via a median sternotomy. There is significant calcified plaque throughout the aortic arch which precludes successful stenting of this innominate stenosis.  Gated cardiac CTA shows mild nonobstructive coronary disease in the proximal LAD. I discussed the operative procedure with the patient and her husband including alternatives, benefits and risks; including but not limited to bleeding, blood transfusion, infection, stroke, graft occlusion and  Diane Giles understands and agrees to proceed.    Preparation:  The patient was seen in the preoperative holding area and the correct patient, correct operation were confirmed with the patient after reviewing the medical record and catheterization. The consent was signed by me. Preoperative antibiotics were given. A pulmonary arterial line and radial arterial line were placed by the  anesthesia team. The patient was taken back to the operating room and positioned supine on the operating room table. After being placed under general endotracheal anesthesia by the anesthesia team a foley catheter was placed. The neck, chest, abdomen, and both groins were prepped with betadine soap and solution and draped in the usual sterile manner. A surgical time-out was taken and the correct patient and operative procedure were confirmed with the nursing and anesthesia staff.   Median Sternotomy:  A median sternotomy was performed. The pericardium was opened in the midline. Right ventricular function appeared normal. The ascending aorta was of normal size and had no palpable plaque.  The aorto innominate bypass procedure is dictated separately by Dr. Scot Dock.  After the bypass was performed and protamine given hemostasis was achieved. A 28 F mediastinal  drainage tube was placed. The sternum was closed with #6 stainless steel wires. The fascia was closed with continuous # 1 vicryl suture. The subcutaneous tissue was closed with 2-0 vicryl continuous suture. The skin was closed with 3-0 vicryl subcuticular suture. All sponge, needle, and instrument counts were reported correct at the end of the case. Dry sterile dressings were placed over the incisions and around the chest tubes which were connected to pleurevac suction. The patient was then transported to the surgical intensive care unit in stable condition.

## 2019-05-18 NOTE — Op Note (Signed)
NAME: Diane Giles    MRN: [id number] DOB: 1955-09-08    DATE OF OPERATION: 05/18/2019  PREOP DIAGNOSIS:    Innominate artery stenosis  POSTOP DIAGNOSIS:    Same  PROCEDURE:    Aorto innominate bypass (8 mm Dacron graft)  VASCULAR SURGEON: Di Kindle. Edilia Bo, MD  THORACIC SURGEON:  Rexanne Mano, MD  ANESTHESIA: General  EBL: Per anesthesia record  INDICATIONS:    Diane Giles is a 64 y.o. female presented with paresthesias in her right arm and right arm weakness.  He was also having issues with dizziness.  Noninvasive studies suggested an innominate artery stenosis.  CT scan confirmed a proximal innominate artery stenosis with significant calcific disease at the arch making her a poor candidate for an endovascular approach.  She was evaluated preoperatively by Dr. Laneta Simmers in was felt to be a good candidate for aorto innominate bypass.  FINDINGS:   The innominate artery was soft without significant plaque.  TECHNIQUE:   The patient was taken to the operating room after monitoring lines were placed by anesthesia.  Chest neck and abdomen and groins were prepped and draped in usual sterile fashion.  Dr. Laneta Simmers performed a median sternotomy as dictated separately.  The ascending aorta had been exposed and was soft.  Next the dissection was carried up on the innominate artery which was tortuous.  The artery above the stenosis proximally was soft.  There was plenty of room to clamp below the bifurcation of the innominate artery.  The patient was heparinized fully and ACT was monitored throughout the procedure.  An 8 mm Dacron graft was selected.  Initially a partial occlusion clamp was placed on the ascending aorta on the anterior lateral aspect with the pressure maintained approximately 90-100 systolic.  A longitudinal arteriotomy was made.  Using a 4-1/2 mm punch proximally and distally the arteriotomy was tailored.  The 8 mm graft was spatulated and sewn end-to-side to the  ascending aorta using a continuous 3-0 Prolene suture. Co-seal  The clamp was then released and there was good hemostasis.  The graft then brought below the left brachiocephalic vein for anastomosis to the innominate artery.  The innominate artery was clamped proximally distally and divided.  The graft cut the appropriate length and sewn into into the innominate artery using continuous Prolene suture.  Prior to completing this anastomosis the arteries were backbled and flushed appropriately and the anastomosis completed.  CoSeal was applied.  Flow was reestablished to the graft and was an excellent Doppler signal in the innominate artery distal to the anastomosis.  The stump of the innominate artery was oversewn with a 4-0 Prolene suture which was sewn in 2 layers.  Again CoSeal was applied the clamp was released and there was good hemostasis.  One repair suture was placed in the anastomosis of the distal bypass graft with good hemostasis.  Hemostasis was obtained in the wound.  Closure of the sternotomy is dictated by Dr. Laneta Simmers.  The fascial layer was closed with running #1 Vicryl.  The subcutaneous layer was closed with a running layer of Vicryl and the skin was closed with a 4-0 Vicryl.  Dermabond was applied.  Patient tolerated procedure well was transferred to the intensive care unit in stable condition.  Waverly Ferrari, MD, FACS [facility]  DATE OF DICTATION:   05/18/2019

## 2019-05-18 NOTE — Interval H&P Note (Signed)
History and Physical Interval Note:  05/18/2019 7:29 AM  Diane Giles  has presented today for surgery, with the diagnosis of INNOMIATE ARTERY STENOSIS.  The various methods of treatment have been discussed with the patient and family. After consideration of risks, benefits and other options for treatment, the patient has consented to  Procedure(s): AORTA -INNOMIATE BYPASS (N/A) STERNOTOMY (N/A) Sternotomy as a surgical intervention.  The patient's history has been reviewed, patient examined, no change in status, stable for surgery.  I have reviewed the patient's chart and labs.  Questions were answered to the patient's satisfaction.     Gaye Pollack

## 2019-05-18 NOTE — Anesthesia Procedure Notes (Signed)
Arterial Line Insertion Start/End1/03/2020 6:50 AM, 05/18/2019 6:58 AM Performed by: Laretta Alstrom, CRNA, CRNA  Patient location: Pre-op. Preanesthetic checklist: patient identified, IV checked, site marked, risks and benefits discussed, surgical consent, monitors and equipment checked, pre-op evaluation, timeout performed and anesthesia consent Lidocaine 1% used for infiltration Left, radial was placed Catheter size: 20 G Maximum sterile barriers used   Attempts: 2 Procedure performed without using ultrasound guided technique. Following insertion, dressing applied and Biopatch. Post procedure assessment: normal  Patient tolerated the procedure well with no immediate complications.

## 2019-05-18 NOTE — Brief Op Note (Signed)
05/18/2019  10:33 AM  PATIENT:  Diane Giles  64 y.o. female  PRE-OPERATIVE DIAGNOSIS:  INNOMIATE ARTERY STENOSIS  POST-OPERATIVE DIAGNOSIS:  INNOMIATE ARTERY STENOSIS  PROCEDURE:  Procedure(s): AORTA -INNOMIATE BYPASS Using Hemashield Gold Graft Size 83mm (N/A) Sternotomy  SURGEON:  Surgeon(s) and Role: Co-Surgeons    * Yalonda Sample, Fernande Boyden, MD -             * Angelia Mould, MD    PHYSICIAN ASSISTANT: None   ANESTHESIA:   general  EBL:  Less than 100 cc   BLOOD ADMINISTERED:none  DRAINS: one 28 F Chest Tube(s) in the mediastinum   LOCAL MEDICATIONS USED:  NONE  SPECIMEN:  No Specimen  DISPOSITION OF SPECIMEN:  N/A  COUNTS:  YES  TOURNIQUET:  * No tourniquets in log *  DICTATION: .Note written in EPIC  PLAN OF CARE: Admit to inpatient   PATIENT DISPOSITION:  ICU - intubated and hemodynamically stable.   Delay start of Pharmacological VTE agent (>24hrs) due to surgical blood loss or risk of bleeding: yes

## 2019-05-18 NOTE — OR Nursing (Signed)
06:30am - Verified patient name, birthdate, and allergies.

## 2019-05-19 ENCOUNTER — Inpatient Hospital Stay (HOSPITAL_COMMUNITY): Payer: Medicare Other

## 2019-05-19 ENCOUNTER — Encounter: Payer: Self-pay | Admitting: *Deleted

## 2019-05-19 LAB — BASIC METABOLIC PANEL
Anion gap: 9 (ref 5–15)
BUN: 15 mg/dL (ref 8–23)
CO2: 23 mmol/L (ref 22–32)
Calcium: 8.6 mg/dL — ABNORMAL LOW (ref 8.9–10.3)
Chloride: 104 mmol/L (ref 98–111)
Creatinine, Ser: 1.16 mg/dL — ABNORMAL HIGH (ref 0.44–1.00)
GFR calc Af Amer: 57 mL/min — ABNORMAL LOW (ref >60)
GFR calc non Af Amer: 49 mL/min — ABNORMAL LOW (ref >60)
Glucose, Bld: 123 mg/dL — ABNORMAL HIGH (ref 70–99)
Potassium: 4.7 mmol/L (ref 3.5–5.1)
Sodium: 136 mmol/L (ref 135–145)

## 2019-05-19 LAB — CBC
HCT: 29.5 % — ABNORMAL LOW (ref 36.0–46.0)
Hemoglobin: 9.5 g/dL — ABNORMAL LOW (ref 12.0–15.0)
MCH: 29.9 pg (ref 26.0–34.0)
MCHC: 32.2 g/dL (ref 30.0–36.0)
MCV: 92.8 fL (ref 80.0–100.0)
Platelets: 343 10*3/uL (ref 150–400)
RBC: 3.18 MIL/uL — ABNORMAL LOW (ref 3.87–5.11)
RDW: 13.6 % (ref 11.5–15.5)
WBC: 13 10*3/uL — ABNORMAL HIGH (ref 4.0–10.5)
nRBC: 0 % (ref 0.0–0.2)

## 2019-05-19 LAB — POCT I-STAT 7, (LYTES, BLD GAS, ICA,H+H)
Bicarbonate: 25.5 mmol/L (ref 20.0–28.0)
Calcium, Ion: 1.14 mmol/L — ABNORMAL LOW (ref 1.15–1.40)
HCT: 27 % — ABNORMAL LOW (ref 36.0–46.0)
Hemoglobin: 9.2 g/dL — ABNORMAL LOW (ref 12.0–15.0)
O2 Saturation: 98 %
Patient temperature: 35.1
Potassium: 3.2 mmol/L — ABNORMAL LOW (ref 3.5–5.1)
Sodium: 139 mmol/L (ref 135–145)
TCO2: 27 mmol/L (ref 22–32)
pCO2 arterial: 40.6 mmHg (ref 32.0–48.0)
pH, Arterial: 7.397 (ref 7.350–7.450)
pO2, Arterial: 92 mmHg (ref 83.0–108.0)

## 2019-05-19 LAB — GLUCOSE, CAPILLARY
Glucose-Capillary: 131 mg/dL — ABNORMAL HIGH (ref 70–99)
Glucose-Capillary: 121 mg/dL — ABNORMAL HIGH (ref 70–99)
Glucose-Capillary: 125 mg/dL — ABNORMAL HIGH (ref 70–99)
Glucose-Capillary: 139 mg/dL — ABNORMAL HIGH (ref 70–99)
Glucose-Capillary: 138 mg/dL — ABNORMAL HIGH (ref 70–99)
Glucose-Capillary: 147 mg/dL — ABNORMAL HIGH (ref 70–99)
Glucose-Capillary: 136 mg/dL — ABNORMAL HIGH (ref 70–99)
Glucose-Capillary: 126 mg/dL — ABNORMAL HIGH (ref 70–99)
Glucose-Capillary: 138 mg/dL — ABNORMAL HIGH (ref 70–99)

## 2019-05-19 MED ORDER — HEPARIN SODIUM (PORCINE) 5000 UNIT/ML IJ SOLN
5000.0000 [IU] | Freq: Three times a day (TID) | INTRAMUSCULAR | Status: DC
  Administered 2019-05-19 – 2019-05-21 (×7): 5000 [IU] via SUBCUTANEOUS
  Filled 2019-05-19 (×5): qty 1

## 2019-05-19 MED ORDER — OXYCODONE HCL 5 MG PO TABS
10.0000 mg | ORAL_TABLET | ORAL | Status: DC | PRN
  Administered 2019-05-19: 10 mg via ORAL
  Filled 2019-05-19: qty 2

## 2019-05-19 MED ORDER — CHLORHEXIDINE GLUCONATE CLOTH 2 % EX PADS
6.0000 | MEDICATED_PAD | Freq: Every day | CUTANEOUS | Status: DC
  Administered 2019-05-20 – 2019-05-21 (×2): 6 via TOPICAL

## 2019-05-19 MED ORDER — INSULIN ASPART 100 UNIT/ML ~~LOC~~ SOLN
0.0000 [IU] | Freq: Three times a day (TID) | SUBCUTANEOUS | Status: DC
  Administered 2019-05-19 – 2019-05-20 (×4): 2 [IU] via SUBCUTANEOUS

## 2019-05-19 NOTE — Progress Notes (Addendum)
      [address]       ,[place] [address]             [phone]      1 Day Post-Op Procedure(s) (LRB): AORTA -INNOMIATE BYPASS Using Hemashield Gold Graft Size 70mm (N/A) Sternotomy   Subjective:  No specific complaints.  Good pain control  Objective: Vital signs in last 24 hours: Temp:  [95.2 F (35.1 C)-98.2 F (36.8 C)] 98.2 F (36.8 C) (01/11 2000) Pulse Rate:  [63-89] 73 (01/12 0800) Cardiac Rhythm: Normal sinus rhythm (01/12 0400) Resp:  [9-23] 12 (01/12 0800) BP: (59-159)/(32-59) 107/44 (01/12 0800) SpO2:  [94 %-100 %] 97 % (01/12 0800) Arterial Line BP: (90-172)/(32-103) 143/44 (01/12 0800) FiO2 (%):  [40 %-50 %] 40 % (01/11 1600) Weight:  [85.7 kg] 85.7 kg (01/12 0600)  Hemodynamic parameters for last 24 hours: PAP: (25-42)/(10-23) 41/16 CVP:  [8 mmHg-13 mmHg] 11 mmHg CO:  [4 L/min-4.5 L/min] 4.5 L/min CI:  [2.2 L/min/m2-2.5 L/min/m2] 2.5 L/min/m2  Intake/Output from previous day: 01/11 0701 - 01/12 0700 In: 3154 [P.O.:120; I.V.:2111; IV [id number] Out: 1188 [Urine:830; Blood:300; Chest Tube:58] Intake/Output this shift: No intake/output data recorded.  General appearance: alert, cooperative and no distress Heart: regular rate and rhythm Lungs: clear to auscultation bilaterally Abdomen: soft, non-tender; bowel sounds normal; no masses,  no organomegaly Extremities: extremities normal, atraumatic, no cyanosis or edema Wound: clean and dry  Lab Results: Recent Labs    05/18/19 1619 05/19/19 0428  WBC 18.4* 13.0*  HGB 9.8* 9.5*  HCT 29.9* 29.5*  PLT 379 343   BMET:  Recent Labs    05/18/19 1619 05/19/19 0428  NA 135 136  K 4.2 4.7  CL 102 104  CO2 23 23  GLUCOSE 143* 123*  BUN 13 15  CREATININE 1.25* 1.16*  CALCIUM 8.5* 8.6*    PT/INR:  Recent Labs    05/18/19 1119  LABPROT 14.0  INR 1.1   ABG    Component Value Date/Time   PHART 7.275 (L) 05/18/2019 1612   HCO3 24.8 05/18/2019 1612   TCO2 26  05/18/2019 1612   ACIDBASEDEF 2.0 05/18/2019 1612   O2SAT 97.0 05/18/2019 1612   CBG (last 3)  Recent Labs    05/19/19 0442 05/19/19 0632 05/19/19 0738  GLUCAP 125* 139* 138*    Assessment/Plan: S/P Procedure(s) (LRB): AORTA -INNOMIATE BYPASS Using Hemashield Gold Graft Size 56mm (N/A) Sternotomy  1. S/P Sternotomy- looks good, pain is well controlled 2. Pulm- chest tube output is minimal since surgery, no pleural effusion, pneumothorax, likely d/c chest tube today 3. Dispo- patient stable, care per primary, likely d/c chest tubes today   LOS: 1 day    Ellwood Handler, PA-C  05/19/2019   Chart reviewed, patient examined, agree with above. She looks good. Will dc chest tube, arterial line and sleeve. She has been getting toradol for pain but says she has had oxy IR before and it doesn't make her itch. CXR shows bibasilar atelectasis. She needs to work on IS, start ambulation. She can transfer to 4E.

## 2019-05-19 NOTE — Discharge Instructions (Signed)
Discharge Instructions:  1. You may shower, please wash incisions daily with soap and water and keep dry.  If you wish to cover wounds with dressing you may do so but please keep clean and change daily.  No tub baths or swimming until incisions have completely healed.  If your incisions become red or develop any drainage please call our office at [phone]  2. No Driving until cleared by Dr. Vivi Martens office and you are no longer using narcotic pain medications  3. Fever of 101.5 for at least 24 hours with no source, please contact our office at [phone]  5. Activity- up as tolerated, please walk at least 3 times per day.  Avoid strenuous activity, no lifting, pushing, or pulling with your arms over 8-10 lbs for a minimum of 6 weeks  6. If any questions or concerns arise, please do not hesitate to contact our office at [phone]

## 2019-05-19 NOTE — Progress Notes (Signed)
   VASCULAR SURGERY ASSESSMENT & PLAN:   POD 1 - Aorto-innominate bypass. Median Sternotomy.  Doing well.  Neuro intact with a palpable right radial pulse.  Chest tube with minimal drainage.  Will defer to Dr. Cyndia Bent as to when this will be removed.  VASCULAR QUALITY INITIATIVE: The patient is on aspirin and is on a statin.  Likely transfer to 4E today   SUBJECTIVE:   No specific complaints.  Pain well controlled.  PHYSICAL EXAM:   Vitals:   05/19/19 0400 05/19/19 0500 05/19/19 0600 05/19/19 0700  BP: (!) 106/40 (!) 122/43 (!) 139/53 (!) 110/49  Pulse: 65 65 74 72  Resp: 10 (!) 23 17 (!) 9  Temp:      TempSrc:      SpO2: 100% 100% 96% 97%  Weight:   85.7 kg   Height:       Lungs clear Palpable right radial pulse NEURO: No focal weakness or paresthesias.   LABS:   Lab Results  Component Value Date   WBC 13.0 (H) 05/19/2019   HGB 9.5 (L) 05/19/2019   HCT 29.5 (L) 05/19/2019   MCV 92.8 05/19/2019   PLT 343 05/19/2019   Lab Results  Component Value Date   CREATININE 1.16 (H) 05/19/2019   CBG (last 3)  Recent Labs    05/19/19 0214 05/19/19 0442 05/19/19 0632  GLUCAP 121* 125* 139*    PROBLEM LIST:    Active Problems:   Atherosclerotic stenosis of innominate artery   CURRENT MEDS:   . acetaminophen  1,000 mg Oral Q6H   Or  . acetaminophen (TYLENOL) oral liquid 160 mg/5 mL  1,000 mg Per Tube Q6H  . acetaminophen (TYLENOL) oral liquid 160 mg/5 mL  650 mg Per Tube Once   Or  . acetaminophen  650 mg Rectal Once  . aspirin EC  325 mg Oral Daily   Or  . aspirin  324 mg Per Tube Daily  . atenolol  50 mg Oral Daily  . atorvastatin  80 mg Oral q1800  . bisacodyl  10 mg Oral Daily   Or  . bisacodyl  10 mg Rectal Daily  . Chlorhexidine Gluconate Cloth  6 each Topical Daily  . chlorthalidone  25 mg Oral Daily  . docusate sodium  200 mg Oral Daily  . furosemide  20 mg Oral q morning - 10a  . gabapentin  100 mg Oral TID  . hydrOXYzine  25 mg Oral BID    . losartan  25 mg Oral QPM  . mouth rinse  15 mL Mouth Rinse BID  . metoCLOPramide (REGLAN) injection  10 mg Intravenous Q6H  . [START ON 05/20/2019] pantoprazole  40 mg Oral Daily  . sodium chloride flush  10-40 mL Intracatheter Q12H  . sodium chloride flush  3 mL Intravenous Q12H  . traZODone  50 mg Oral QHS    [name] Office: [phone] 05/19/2019

## 2019-05-19 NOTE — Progress Notes (Signed)
Chaplain engaged in follow-up visit with Diane Giles and [name].  Chaplain offered continuous support.  Chaplain will follow-up as needed.

## 2019-05-19 NOTE — Progress Notes (Signed)
CT Surgery PM Rounds  Blood pressure (!) 129/38, pulse 64, temperature 98.2 F (36.8 C), temperature source Oral, resp. rate 11, height 4\' 11"  (1.499 m), weight 85.7 kg, last menstrual period 05/07/2005, SpO2 98 %.   Resting in chair on room air nsr No c/o pain neuro intact Good R radial pulse

## 2019-05-20 ENCOUNTER — Inpatient Hospital Stay (HOSPITAL_COMMUNITY): Payer: Medicare Other

## 2019-05-20 LAB — GLUCOSE, CAPILLARY
Glucose-Capillary: 117 mg/dL — ABNORMAL HIGH (ref 70–99)
Glucose-Capillary: 140 mg/dL — ABNORMAL HIGH (ref 70–99)
Glucose-Capillary: 123 mg/dL — ABNORMAL HIGH (ref 70–99)
Glucose-Capillary: 136 mg/dL — ABNORMAL HIGH (ref 70–99)

## 2019-05-20 LAB — POCT ACTIVATED CLOTTING TIME: Activated Clotting Time: 219 seconds

## 2019-05-20 MED ORDER — ASPIRIN 81 MG PO CHEW: 81.0000 mg | CHEWABLE_TABLET | Freq: Every day | ORAL | Status: DC

## 2019-05-20 MED FILL — Sodium Chloride IV Soln 0.9%: INTRAVENOUS | Qty: 2000 | Status: AC

## 2019-05-20 MED FILL — Heparin Sodium (Porcine) Inj 1000 Unit/ML: INTRAMUSCULAR | Qty: 30 | Status: AC

## 2019-05-20 NOTE — Progress Notes (Addendum)
  Progress Note  VASCULAR SURGERY ASSESSMENT & PLAN:   POD 2 - Aorto-innominate bypass. Median Sternotomy.  Doing well.  Neuro intact with a palpable right radial pulse.  VASCULAR QUALITY INITIATIVE: The patient is on aspirin and is on a statin.  DISPOSITION: Possibly home tomorrow.  [name], MD Office: [phone]    05/20/2019 7:23 AM 2 Days Post-Op  Subjective:  Says her right arm feels better  Afebrile HR 60's-80's NSR [id number] systolic  [id number]  Vitals:   05/19/19 2142 05/20/19 0345  BP: 131/60 (!) 119/48  Pulse:  70  Resp: (!) 22 10  Temp: 97.9 F (36.6 C) (!) 97.4 F (36.3 C)  SpO2: 94% 100%    Physical Exam: Cardiac:  regular Lungs:  Non labored Extremities:  Palpable right radial pulse   CBC    Component Value Date/Time   WBC 13.0 (H) 05/19/2019 0428   RBC 3.18 (L) 05/19/2019 0428   HGB 9.5 (L) 05/19/2019 0428   HGB 11.5 08/16/2017 1141   HCT 29.5 (L) 05/19/2019 0428   HCT 34.4 08/16/2017 1141   PLT 343 05/19/2019 0428   PLT 325 08/16/2017 1141   MCV 92.8 05/19/2019 0428   MCV 90.6 09/23/2017 1329   MCV 92 08/16/2017 1141   MCH 29.9 05/19/2019 0428   MCHC 32.2 05/19/2019 0428   RDW 13.6 05/19/2019 0428   RDW 13.2 08/16/2017 1141   LYMPHSABS 2.2 08/16/2017 1141   MONOABS 1.1 (H) 08/26/2013 1925   EOSABS 0.3 08/16/2017 1141   BASOSABS 0.0 08/16/2017 1141    BMET    Component Value Date/Time   NA 136 05/19/2019 0428   NA 141 03/21/2018 0844   K 4.7 05/19/2019 0428   CL 104 05/19/2019 0428   CO2 23 05/19/2019 0428   GLUCOSE 123 (H) 05/19/2019 0428   BUN 15 05/19/2019 0428   BUN 23 03/21/2018 0844   CREATININE 1.16 (H) 05/19/2019 0428   CREATININE 0.75 10/27/2015 0850   CALCIUM 8.6 (L) 05/19/2019 0428   GFRNONAA 49 (L) 05/19/2019 0428   GFRNONAA >89 05/14/2014 1409   GFRAA 57 (L) 05/19/2019 0428   GFRAA >89 05/14/2014 1409    INR    Component Value Date/Time   INR 1.1 05/18/2019 1119     Intake/Output  Summary (Last 24 hours) at 05/20/2019 0723 Last data filed at 05/20/2019 0716 Gross per 24 hour  Intake 320 ml  Output 1315 ml  Net -995 ml     Assessment:  64 y.o. female is s/p:  aorto-innominate bypass with 58mm Dacron graft  2 Days Post-Op  Plan: -pt continues to have palpable right radial pulse and pre op sx improved.  -increase mobilization per CT for median sternotomy precautions. -CPAP orders placed    Leontine Locket, PA-C [facility] [phone] 05/20/2019 7:23 AM

## 2019-05-20 NOTE — Progress Notes (Addendum)
      [address]       [place] [address]             [phone]      2 Days Post-Op Procedure(s) (LRB): AORTA -INNOMIATE BYPASS Using Hemashield Gold Graft Size 48mm (N/A) Sternotomy   Subjective:  Patient without complaints.  She states she has no pain. + ambulation  Objective: Vital signs in last 24 hours: Temp:  [97.4 F (36.3 C)-98.5 F (36.9 C)] 97.8 F (36.6 C) (01/13 0747) Pulse Rate:  [64-79] 76 (01/13 0747) Cardiac Rhythm: Normal sinus rhythm (01/12 2148) Resp:  [10-22] 16 (01/13 0747) BP: (103-154)/(38-60) 103/45 (01/13 0747) SpO2:  [92 %-100 %] 94 % (01/13 0747) Arterial Line BP: (141-143)/(39-44) 141/39 (01/12 0900)  Intake/Output from previous day: 01/12 0701 - 01/13 0700 In: 320 [P.O.:120; IV Piggyback:200] Out: 515 [Urine:515] Intake/Output this shift: Total I/O In: -  Out: 800 [Urine:800]  General appearance: alert, cooperative and no distress Heart: regular rate and rhythm Lungs: clear to auscultation bilaterally Abdomen: soft, non-tender; bowel sounds normal; no masses,  no organomegaly Extremities: extremities normal, atraumatic, no cyanosis or edema Wound: clean and dry  Lab Results: Recent Labs    05/18/19 1619 05/19/19 0428  WBC 18.4* 13.0*  HGB 9.8* 9.5*  HCT 29.9* 29.5*  PLT 379 343   BMET:  Recent Labs    05/18/19 1619 05/19/19 0428  NA 135 136  K 4.2 4.7  CL 102 104  CO2 23 23  GLUCOSE 143* 123*  BUN 13 15  CREATININE 1.25* 1.16*  CALCIUM 8.5* 8.6*    PT/INR:  Recent Labs    05/18/19 1119  LABPROT 14.0  INR 1.1   ABG    Component Value Date/Time   PHART 7.275 (L) 05/18/2019 1612   HCO3 24.8 05/18/2019 1612   TCO2 26 05/18/2019 1612   ACIDBASEDEF 2.0 05/18/2019 1612   O2SAT 97.0 05/18/2019 1612   CBG (last 3)  Recent Labs    05/19/19 1622 05/19/19 2021 05/20/19 0556  GLUCAP 126* 138* 117*    Assessment/Plan: S/P Procedure(s) (LRB): AORTA -INNOMIATE BYPASS Using Hemashield Gold  Graft Size 31mm (N/A) Sternotomy  1. CV- hemodynamically stable in NSR- on home Atenolol, Cozaar 2. Pulm- no acute issues, wean oxygen, will get 2V CXR today post chest tube removal 3. GU- d/c foley today 4. Ambulate 5. Dispo- patient stable, will get 2V CXR today, okay to remove foley, patient can likely be discharged in 24-48 hours if remains clinically stable... post operative follow up suture removal and appointment have been placed in chart.. Patient educated on sternal precautions and discharge instructions placed in chart   LOS: 2 days   Ellwood Handler, PA-C  05/20/2019   Chart reviewed, patient examined, agree with above. She feels fine and has minimal pain.  Incision looks good and good radial pulse. Encouraged to use IS and ambulate. Wean oxygen and she could go home tomorrow if off oxygen.

## 2019-05-20 NOTE — Progress Notes (Signed)
Asked patient about using CPAP tonight, patient stated that this was her last night at the hospital and she did not want to go on tonight.  No distress noted at this time.  Will continue to monitor.

## 2019-05-21 LAB — GLUCOSE, CAPILLARY: Glucose-Capillary: 120 mg/dL — ABNORMAL HIGH (ref 70–99)

## 2019-05-21 MED ORDER — TRAMADOL HCL 50 MG PO TABS: 50.0000 mg | ORAL_TABLET | Freq: Four times a day (QID) | ORAL | 0 refills | Status: DC | PRN

## 2019-05-21 NOTE — Discharge Summary (Signed)
Discharge Summary    Diane Giles [birth year] 64 y.o. 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 female with a history of hypertension, diabetes, prior smoking, and diffuse vascular disease who underwent aortoiliac bypass in 2000. She subsequently underwent angioplasty and stenting of a stenosis in the right limb of her aortoiliac graft in 2018. She had carotid bruits which prompted a duplex scan that showed evidence of innominate artery stenosis. She subsequently saw Dr. Edilia Bo and had a CT angiogram of the chest and neck which showed a high-grade proximal innominate artery stenosis with severe calcific disease of the aortic arch. The patient reports worsening symptoms of right upper extremity fatigue with exertion. 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 She has also had numbness in the right arm and hand and some dizziness.   She had a gated cardiac CTA which showed normal coronary artery origin with left dominance.  Her coronary calcium score is 140 which was the 60th percentile for age and sex matched controls.  There is mild nonobstructive disease in the proximal LAD.  The CT of the chest also showed a 7 mm right lower lobe pulmonary nodule which will require a further follow-up chest CT scan in 6 to 12 months.  Hospital Course:  The patient was admitted to the hospital and taken to the operating room on 05/18/2019 and underwent: Aorto innominate bypass (8 mm Dacron graft)    The pt tolerated the procedure well and was transported to the PACU in good condition.   POD 1, she was doing well.  Her CT was removed.  She was transferred to 4 east step down unit.  Continue asa/statin.  Continue IS every hour.   She had a palpable right radial pulse and neuro in tact.      She was doing well on POD 2 and was discharged.  She continued to have a palpable right radial pulse.    The remainder of the hospital course consisted of increasing mobilization and increasing intake of solids without difficulty.  CBC    Component Value Date/Time   WBC 13.0 (H) 05/19/2019 0428   RBC 3.18 (L) 05/19/2019 0428   HGB 9.5 (L) 05/19/2019 0428   HGB 11.5 08/16/2017 1141   HCT 29.5 (L) 05/19/2019 0428   HCT 34.4 08/16/2017 1141   PLT 343 05/19/2019 0428   PLT 325 08/16/2017 1141   MCV 92.8 05/19/2019 0428   MCV 90.6 09/23/2017 1329   MCV 92 08/16/2017 1141   MCH 29.9 05/19/2019 0428   MCHC 32.2 05/19/2019 0428   RDW 13.6 05/19/2019 0428   RDW 13.2 08/16/2017 1141   LYMPHSABS 2.2 08/16/2017 1141   MONOABS 1.1 (H) 08/26/2013 1925   EOSABS 0.3 08/16/2017 1141   BASOSABS 0.0 08/16/2017 1141    BMET    Component Value Date/Time   NA 136 05/19/2019 0428   NA 141 03/21/2018 0844   K 4.7 05/19/2019 0428   CL 104 05/19/2019 0428   CO2 23 05/19/2019 0428   GLUCOSE 123 (H) 05/19/2019 0428   BUN 15 05/19/2019 0428   BUN 23 03/21/2018 0844   CREATININE 1.16 (H) 05/19/2019 0428   CREATININE 0.75 10/27/2015 0850   CALCIUM 8.6 (L) 05/19/2019 0428   GFRNONAA 49 (L) 05/19/2019 4259  GFRNONAA >89 05/14/2014 1409   GFRAA 57 (L) 05/19/2019 0428   GFRAA >89 05/14/2014 1409      Discharge Instructions    Discharge patient   Complete by: As directed    Discharge disposition: 01-Home or Self Care   Discharge patient date: 05/21/2019      Discharge Diagnosis:  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  . Aortoiliac occlusive disease (HCC) 12/03/2016  . Centrilobular emphysema (HCC) 10/03/2016  . Postmenopausal bleeding 12/01/2015  . Endometrial thickening on ultra sound 07/28/2015  . 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  Type 2 diabetes mellitus (HCC) 05/18/2014  . DDD (degenerative disc disease) 03/18/2013  . Metabolic syndrome 07/29/2012  . Essential hypertension, benign 07/18/2012  . Hyperlipidemia 07/18/2012  . Obstructive sleep apnea 07/04/2012  . Osteoporosis 07/04/2012  . Morbid obesity (HCC) 07/04/2012   Past Medical History:  Diagnosis Date  . Aortic stenosis    mild-moderate AS 04/22/19 echo  . Aortoiliac occlusive disease (HCC) 12/03/2016  . Arthritis   . Diabetes mellitus without complication (HCC)    per pt she is pre- diabetic  . Diverticulitis   . Heart murmur   . Hypertension   . Osteoporosis   . Sleep apnea   . Tobacco use disorder 07/04/2012     Allergies as of 05/21/2019      Reactions   Codeine Itching      Medication List    STOP taking these medications   sulfamethoxazole-trimethoprim 800-160 MG tablet Commonly known as: BACTRIM DS     TAKE these medications   aspirin 81 MG chewable tablet Chew 81 mg by mouth daily.   atenolol-chlorthalidone 50-25 MG tablet Commonly known as: TENORETIC TAKE 1 TABLET IN THE MORNING. What changed: when to take this   atorvastatin 80 MG tablet Commonly known as: LIPITOR Take 1 tablet (80 mg total) by mouth daily at 6 PM.   furosemide 20 MG tablet Commonly known as: LASIX Take 1 tablet (20 mg total) by mouth every morning.   gabapentin 100 MG capsule Commonly known as: NEURONTIN Take 100 mg by mouth 3 (three) times daily.   hydrOXYzine 25 MG tablet Commonly known as: ATARAX/VISTARIL Take 25 mg by mouth 2 (two) times daily.   Lancets Misc Use as directed once a day. (DX: R73.9)   losartan 25 MG tablet Commonly known as: COZAAR TAKE 1 TABLET IN THE P.M. What changed:   how much to take  how to take this  when to take this  additional instructions   metFORMIN 500 MG tablet Commonly known as: GLUCOPHAGE TAKE 2 TABLETS TWICE DAILY WITH MEALS. What changed:   how much to take  how to take this  when to  take this  additional instructions   OMEGA 3 PO Take 1 capsule by mouth 2 (two) times daily.   traMADol 50 MG tablet Commonly known as: ULTRAM Take 1 tablet (50 mg total) by mouth every 6 (six) hours as needed for moderate pain.   traZODone 50 MG tablet Commonly known as: DESYREL Take 50 mg by mouth at bedtime.       Prescriptions given: Tramadol#20 No Refill  Instructions: Discharge Instructions:  1. You may shower, please wash incisions daily with soap and water and keep dry.  If you wish to cover wounds with dressing you may do so but please keep clean and change daily.  No tub baths or swimming until incisions have completely healed.  If your incisions become red or develop any drainage please call our office at [phone]  2. No Driving until cleared by Dr. Sharee Pimple office and you are no longer using narcotic pain medications  3. Fever of 101.5 for at least 24 hours with no source, please contact our office at [phone]  5. Activity- up as tolerated, please walk at least 3 times per day.  Avoid strenuous activity, no lifting, pushing, or pulling with your arms over 8-10 lbs for a minimum of 6 weeks  6. If any questions or concerns arise, please do not hesitate to contact our office at [phone]  Disposition: home  Patient's condition: is Good  Follow up: 1. Dr. Edilia Bo in 3 weeks 2. Dr. Laneta Simmers 06/17/2019 3. TCTS 05/29/2019 for suture removal    Doreatha Massed, PA-C [facility] [phone] 05/21/2019  7:51 AM

## 2019-05-21 NOTE — Plan of Care (Signed)
Continue to monitor

## 2019-05-21 NOTE — Progress Notes (Signed)
Discharge instructions completed with pt. Pt educated on importance of site care, sternal precautions, and restrictions. IV and tele removed. Pt states ride is coming around 10am. Will continue to monitor.

## 2019-05-21 NOTE — Progress Notes (Addendum)
  Progress Note    05/21/2019 7:36 AM 3 Days Post-Op  Subjective:  No complaints; wants to go home  Afebrile HR 70's-90's NSR [id number] systolic [id number]   Vitals:   05/20/19 2007 05/21/19 0449  BP: (!) 112/44 (!) 156/57  Pulse: 72 88  Resp: 18 20  Temp: 98.2 F (36.8 C) 98.4 F (36.9 C)  SpO2: 99% 100%    Physical Exam: Cardiac:  regular Lungs:  Non labored Incisions:  Median sternotomy healing nicely Extremities:  Moving all extremities equally; palpable right radial pulse.   CBC    Component Value Date/Time   WBC 13.0 (H) 05/19/2019 0428   RBC 3.18 (L) 05/19/2019 0428   HGB 9.5 (L) 05/19/2019 0428   HGB 11.5 08/16/2017 1141   HCT 29.5 (L) 05/19/2019 0428   HCT 34.4 08/16/2017 1141   PLT 343 05/19/2019 0428   PLT 325 08/16/2017 1141   MCV 92.8 05/19/2019 0428   MCV 90.6 09/23/2017 1329   MCV 92 08/16/2017 1141   MCH 29.9 05/19/2019 0428   MCHC 32.2 05/19/2019 0428   RDW 13.6 05/19/2019 0428   RDW 13.2 08/16/2017 1141   LYMPHSABS 2.2 08/16/2017 1141   MONOABS 1.1 (H) 08/26/2013 1925   EOSABS 0.3 08/16/2017 1141   BASOSABS 0.0 08/16/2017 1141    BMET    Component Value Date/Time   NA 136 05/19/2019 0428   NA 141 03/21/2018 0844   K 4.7 05/19/2019 0428   CL 104 05/19/2019 0428   CO2 23 05/19/2019 0428   GLUCOSE 123 (H) 05/19/2019 0428   BUN 15 05/19/2019 0428   BUN 23 03/21/2018 0844   CREATININE 1.16 (H) 05/19/2019 0428   CREATININE 0.75 10/27/2015 0850   CALCIUM 8.6 (L) 05/19/2019 0428   GFRNONAA 49 (L) 05/19/2019 0428   GFRNONAA >89 05/14/2014 1409   GFRAA 57 (L) 05/19/2019 0428   GFRAA >89 05/14/2014 1409    INR    Component Value Date/Time   INR 1.1 05/18/2019 1119     Intake/Output Summary (Last 24 hours) at 05/21/2019 0736 Last data filed at 05/21/2019 0449 Gross per 24 hour  Intake 360 ml  Output 1200 ml  Net -840 ml     Assessment:  64 y.o. female is s/p:  Aorto-innominate bypass. Median Sternotomy  3 Days  Post-Op  Plan: -pt continues to have palpable right radial pulse.  Neuro in tact.   -continue statin/asa -discharge home today   Leontine Locket, [place] [facility] [phone] 05/21/2019 7:36 AM  I have interviewed the patient and examined the patient. I agree with the findings by the PA.  Palpable right radial pulse.  Neuro intact.  For discharge today.  She is on aspirin and is on a statin.  Gae Gallop, MD [phone]

## 2019-05-21 NOTE — Progress Notes (Signed)
      [address]       [place] [address]             [phone]      3 Days Post-Op Procedure(s) (LRB): AORTA -INNOMIATE BYPASS Using Hemashield Gold Graft Size 43mm (N/A) Sternotomy   Subjective:  Patient feels good.  States she is going home today.  Denies pain, shortness of breath.  Objective: Vital signs in last 24 hours: Temp:  [97.4 F (36.3 C)-98.4 F (36.9 C)] 98.4 F (36.9 C) (01/14 0449) Pulse Rate:  [72-88] 88 (01/14 0449) Cardiac Rhythm: Normal sinus rhythm (01/13 1910) Resp:  [13-20] 20 (01/14 0449) BP: (103-156)/(43-57) 156/57 (01/14 0449) SpO2:  [94 %-100 %] 100 % (01/14 0449) Weight:  [84.8 kg] 84.8 kg (01/14 0449)  Intake/Output from previous day: 01/13 0701 - 01/14 0700 In: 360 [P.O.:360] Out: 2000 [Urine:2000]  General appearance: alert, cooperative and no distress Heart: regular rate and rhythm Lungs: clear to auscultation bilaterally Abdomen: soft, non-tender; bowel sounds normal; no masses,  no organomegaly Extremities: extremities normal, atraumatic, no cyanosis or edema Wound: clean and dry  Lab Results: Recent Labs    05/18/19 1619 05/19/19 0428  WBC 18.4* 13.0*  HGB 9.8* 9.5*  HCT 29.9* 29.5*  PLT 379 343   BMET:  Recent Labs    05/18/19 1619 05/19/19 0428  NA 135 136  K 4.2 4.7  CL 102 104  CO2 23 23  GLUCOSE 143* 123*  BUN 13 15  CREATININE 1.25* 1.16*  CALCIUM 8.5* 8.6*    PT/INR:  Recent Labs    05/18/19 1119  LABPROT 14.0  INR 1.1   ABG    Component Value Date/Time   PHART 7.275 (L) 05/18/2019 1612   HCO3 24.8 05/18/2019 1612   TCO2 26 05/18/2019 1612   ACIDBASEDEF 2.0 05/18/2019 1612   O2SAT 97.0 05/18/2019 1612   CBG (last 3)  Recent Labs    05/20/19 1558 05/20/19 2108 05/21/19 0624  GLUCAP 123* 136* 120*    Assessment/Plan: S/P Procedure(s) (LRB): AORTA -INNOMIATE BYPASS Using Hemashield Gold Graft Size 25mm (N/A) Sternotomy  1. CV- hemodynamically stable on home  medications 2. Sternotomy- healing well, sternum is stable 3. Dispo- patient is stable for discharge from our standpoint   LOS: 3 days    Ellwood Handler, PA-C  05/21/2019

## 2019-05-27 MED FILL — Heparin Sodium (Porcine) Inj 1000 Unit/ML: INTRAMUSCULAR | Qty: 30 | Status: AC

## 2019-05-27 MED FILL — Magnesium Sulfate Inj 50%: INTRAMUSCULAR | Qty: 10 | Status: AC

## 2019-05-27 MED FILL — Cefuroxime Sodium For Inj 750 MG: INTRAMUSCULAR | Qty: 750 | Status: AC

## 2019-05-27 MED FILL — Heparin Sodium (Porcine) Inj 1000 Unit/ML: INTRAMUSCULAR | Qty: 2500 | Status: AC

## 2019-05-27 MED FILL — Potassium Chloride Inj 2 mEq/ML: INTRAVENOUS | Qty: 40 | Status: AC

## 2019-05-29 ENCOUNTER — Other Ambulatory Visit: Payer: Self-pay | Admitting: *Deleted

## 2019-05-29 ENCOUNTER — Ambulatory Visit (INDEPENDENT_AMBULATORY_CARE_PROVIDER_SITE_OTHER): Payer: Self-pay | Admitting: *Deleted

## 2019-05-29 ENCOUNTER — Other Ambulatory Visit: Payer: Self-pay

## 2019-05-29 VITALS — Temp 97.7°F

## 2019-05-29 DIAGNOSIS — G8918 Other acute postprocedural pain: Secondary | ICD-10-CM

## 2019-05-29 DIAGNOSIS — I771 Stricture of artery: Secondary | ICD-10-CM

## 2019-05-29 DIAGNOSIS — Z09 Encounter for follow-up examination after completed treatment for conditions other than malignant neoplasm: Secondary | ICD-10-CM

## 2019-05-29 DIAGNOSIS — Z4802 Encounter for removal of sutures: Secondary | ICD-10-CM

## 2019-05-29 MED ORDER — TRAMADOL HCL 50 MG PO TABS: 50.0000 mg | ORAL_TABLET | Freq: Four times a day (QID) | ORAL | 0 refills | Status: DC | PRN

## 2019-05-29 NOTE — Progress Notes (Signed)
  Diane Giles returns s/p STERNOTOMY for Aorta-Innomnate Artery Bypass performed   by Dr. Scot Dock, vascular surgeon on 05/18/19.  She is doing well, but feels extremely fatigued today.  She says it comes and goes.  Bowels and appetite are good.  Her sternal incision looks great.  I easily removed one suture from her only previous chest tube site. A refill for Tramadol was requested and filled.  She will return as scheduled with a CXR.

## 2019-06-16 ENCOUNTER — Other Ambulatory Visit: Payer: Self-pay | Admitting: Surgery

## 2019-06-16 ENCOUNTER — Telehealth (HOSPITAL_COMMUNITY): Payer: Self-pay

## 2019-06-16 DIAGNOSIS — I7409 Other arterial embolism and thrombosis of abdominal aorta: Secondary | ICD-10-CM

## 2019-06-16 NOTE — Telephone Encounter (Signed)

## 2019-06-17 ENCOUNTER — Encounter: Payer: Self-pay | Admitting: Vascular Surgery

## 2019-06-17 ENCOUNTER — Ambulatory Visit (INDEPENDENT_AMBULATORY_CARE_PROVIDER_SITE_OTHER): Payer: Self-pay | Admitting: Surgery

## 2019-06-17 ENCOUNTER — Encounter: Payer: Self-pay | Admitting: Surgery

## 2019-06-17 ENCOUNTER — Ambulatory Visit
Admission: RE | Admit: 2019-06-17 | Discharge: 2019-06-17 | Disposition: A | Discharge status: Home / Self Care | Payer: Medicare Other | Source: Ambulatory Visit | Attending: Surgery | Admitting: Surgery

## 2019-06-17 ENCOUNTER — Ambulatory Visit (INDEPENDENT_AMBULATORY_CARE_PROVIDER_SITE_OTHER): Payer: Self-pay | Admitting: Vascular Surgery

## 2019-06-17 ENCOUNTER — Other Ambulatory Visit: Payer: Self-pay

## 2019-06-17 VITALS — BP 113/61 | HR 68 | Temp 97.0°F | Resp 18 | Ht 58.5 in | Wt 182.6 lb

## 2019-06-17 VITALS — BP 128/74 | HR 69 | Temp 97.7°F | Resp 16 | Ht 58.5 in | Wt 183.1 lb

## 2019-06-17 DIAGNOSIS — I771 Stricture of artery: Secondary | ICD-10-CM

## 2019-06-17 DIAGNOSIS — I739 Peripheral vascular disease, unspecified: Secondary | ICD-10-CM

## 2019-06-17 DIAGNOSIS — Z48812 Encounter for surgical aftercare following surgery on the circulatory system: Secondary | ICD-10-CM

## 2019-06-17 DIAGNOSIS — Z09 Encounter for follow-up examination after completed treatment for conditions other than malignant neoplasm: Secondary | ICD-10-CM

## 2019-06-17 DIAGNOSIS — I7409 Other arterial embolism and thrombosis of abdominal aorta: Secondary | ICD-10-CM

## 2019-06-17 NOTE — Progress Notes (Signed)
.   Patient name: Diane Giles MRN: [id number] DOB: 1955-12-13 Sex: female  REASON FOR VISIT:   Follow-up after aorto innominate bypass  HPI:   Diane Giles is a pleasant 64 y.o. female who presented with paresthesias in the right upper extremity and right arm weakness.  She was also having significant problems with dizziness.  Noninvasive studies suggested an innominate artery stenosis.  CT scan confirmed a proximal innominate artery stenosis with significant calcific disease of the arch making her a poor candidate for endovascular approach.  For this reason aorto innominate bypass was recommended.  She underwent aorto innominate bypass with Dr. Laneta Simmers and myself on 05/18/2019.  She comes in for routine follow-up visit.  She states that her right arm weakness and paresthesias have resolved.  She is not having problems with dizziness now.  Overall she feels much better and seems to have an excellent result from her bypass.  She was seen this morning by Dr. Laneta Simmers and her median sternotomy was healing well.  She is not a smoker.  She is on aspirin and is on a statin.  Current Outpatient Medications  Medication Sig Dispense Refill  . aspirin 81 MG chewable tablet Chew 81 mg by mouth daily.    Marland Kitchen atenolol-chlorthalidone (TENORETIC) 50-25 MG tablet TAKE 1 TABLET IN THE MORNING. (Patient taking differently: Take 1 tablet by mouth daily. ) 30 tablet 0  . atorvastatin (LIPITOR) 80 MG tablet Take 1 tablet (80 mg total) by mouth daily at 6 PM. 90 tablet 3  . furosemide (LASIX) 20 MG tablet Take 1 tablet (20 mg total) by mouth every morning. 90 tablet 1  . gabapentin (NEURONTIN) 100 MG capsule Take 100 mg by mouth 3 (three) times daily.    . hydrOXYzine (ATARAX/VISTARIL) 25 MG tablet Take 25 mg by mouth 2 (two) times daily.    . Lancets MISC Use as directed once a day. (DX: R73.9) 100 each 2  . losartan (COZAAR) 25 MG tablet TAKE 1 TABLET IN THE P.M. (Patient taking differently: Take 25 mg by  mouth every evening. ) 90 tablet 3  . metFORMIN (GLUCOPHAGE) 500 MG tablet TAKE 2 TABLETS TWICE DAILY WITH MEALS. (Patient taking differently: Take 1,000 mg by mouth 2 (two) times daily with a meal. ) 360 tablet 3  . Omega-3 Fatty Acids (OMEGA 3 PO) Take 1 capsule by mouth 2 (two) times daily.    . traMADol (ULTRAM) 50 MG tablet Take 1 tablet (50 mg total) by mouth every 6 (six) hours as needed for moderate pain. 20 tablet 0  . traZODone (DESYREL) 50 MG tablet Take 50 mg by mouth at bedtime.     No current facility-administered medications for this visit.    REVIEW OF SYSTEMS:  [X]  denotes positive finding, [ ]  denotes negative finding Vascular    Leg swelling    Cardiac    Chest pain or chest pressure:    Shortness of breath upon exertion:    Short of breath when lying flat:    Irregular heart rhythm:    Constitutional    Fever or chills:     PHYSICAL EXAM:   Vitals:   06/17/19 1543  BP: 113/61  Pulse: 68  Resp: 18  Temp: (!) 97 F (36.1 C)  TempSrc: Oral  SpO2: 98%  Weight: 182 lb 9.6 oz (82.8 kg)  Height: 4' 10.5" (1.486 m)   GENERAL: The patient is a well-nourished female, in no acute distress. The vital signs are documented  above. CARDIOVASCULAR: There is a regular rate and rhythm. PULMONARY: There is good air exchange bilaterally without wheezing or rales. VASCULAR: She has a palpable right radial pulse. Her median sternotomy incision is healing nicely. She has bilateral lower extremity swelling.  DATA:   No new data  MEDICAL ISSUES:   STATUS POST AORTO INNOMINATE BYPASS: The patient is doing well status post aorto innominate bypass grafting.  Her symptoms have resolved.  She is on aspirin and is on a statin.  She is not a smoker.  PERIPHERAL VASCULAR DISEASE: This patient underwent an aortoiliac bypass in 2000.  She had developed a stenosis in the proximal right limb of her graft and underwent angioplasty and stenting of the stenosis with a VBX stent.  She  denies significant claudication.  I have ordered follow-up ABIs in 1 year and I will see her back at that time.  She knows to call sooner if she has problems.  BILATERAL LOWER EXTREMITY SWELLING: We have discussed the importance of intermittent leg elevation and the proper positioning for this.  In addition I have recommended knee-high compression stockings with a gradient of 15 to 20 mmHg.  Waverly Ferrari [facility] [place] [phone]

## 2019-06-17 NOTE — Progress Notes (Signed)
     HPI: Patient returns for routine postoperative follow-up having undergone median sternotomy with aorto innominate on 05/18/2019. The patient's early postoperative recovery while in the hospital was notable for an uncomplicated postoperative course. Since hospital discharge the patient reports that she has been feeling well.  Her strength and stamina in the right arm has improved significantly.  She has had no dizziness.   Current Outpatient Medications  Medication Sig Dispense Refill  . aspirin 81 MG chewable tablet Chew 81 mg by mouth daily.    Marland Kitchen atenolol-chlorthalidone (TENORETIC) 50-25 MG tablet TAKE 1 TABLET IN THE MORNING. (Patient taking differently: Take 1 tablet by mouth daily. ) 30 tablet 0  . atorvastatin (LIPITOR) 80 MG tablet Take 1 tablet (80 mg total) by mouth daily at 6 PM. 90 tablet 3  . furosemide (LASIX) 20 MG tablet Take 1 tablet (20 mg total) by mouth every morning. 90 tablet 1  . gabapentin (NEURONTIN) 100 MG capsule Take 100 mg by mouth 3 (three) times daily.    . hydrOXYzine (ATARAX/VISTARIL) 25 MG tablet Take 25 mg by mouth 2 (two) times daily.    . Lancets MISC Use as directed once a day. (DX: R73.9) 100 each 2  . losartan (COZAAR) 25 MG tablet TAKE 1 TABLET IN THE P.M. (Patient taking differently: Take 25 mg by mouth every evening. ) 90 tablet 3  . metFORMIN (GLUCOPHAGE) 500 MG tablet TAKE 2 TABLETS TWICE DAILY WITH MEALS. (Patient taking differently: Take 1,000 mg by mouth 2 (two) times daily with a meal. ) 360 tablet 3  . Omega-3 Fatty Acids (OMEGA 3 PO) Take 1 capsule by mouth 2 (two) times daily.    . traMADol (ULTRAM) 50 MG tablet Take 1 tablet (50 mg total) by mouth every 6 (six) hours as needed for moderate pain. 20 tablet 0  . traZODone (DESYREL) 50 MG tablet Take 50 mg by mouth at bedtime.     No current facility-administered medications for this visit.    Physical Exam: BP 128/74 (BP Location: Right Arm, Patient Position: Sitting, Cuff Size: Large)    Pulse 69   Temp 97.7 F (36.5 C)   Resp 16   Ht 4' 10.5" (1.486 m)   Wt 183 lb 2.1 oz (83.1 kg)   LMP 05/07/2005 (Approximate)   SpO2 96% Comment: RA  BMI 37.62 kg/m  She looks well Cardiac exam shows a regular rate and rhythm with normal heart sounds. Lungs are clear. The chest incision is healing well and the sternum is stable. Her right hand is warm with a strong palpable radial pulse.  Diagnostic Tests:  CLINICAL DATA:  Hypertension.  Aortoiliac occlusive disease  EXAM: CHEST - 2 VIEW  COMPARISON:  May 20, 2019  FINDINGS: Lungs are clear. Heart size and pulmonary vascularity are normal. No adenopathy. Patient is status post median sternotomy. There is aortic atherosclerosis. No bone lesions.  IMPRESSION: Lungs clear. Status post median sternotomy. Aortic Atherosclerosis (ICD10-I70.0).   Electronically Signed   By: Lowella Grip III M.D.   On: 06/17/2019 11:09  Impression:  She is doing well following her surgery.  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  Plan:  She will continue to follow-up with Dr. Scot Dock and will return to see me if she has any problems with her incision.   Gaye Pollack, MD [facility] [phone]

## 2019-06-18 ENCOUNTER — Other Ambulatory Visit: Payer: Self-pay | Admitting: *Deleted

## 2019-06-18 DIAGNOSIS — I739 Peripheral vascular disease, unspecified: Secondary | ICD-10-CM

## 2019-07-01 ENCOUNTER — Encounter (INDEPENDENT_AMBULATORY_CARE_PROVIDER_SITE_OTHER): Payer: Self-pay | Admitting: Otolaryngology

## 2019-07-01 ENCOUNTER — Ambulatory Visit (INDEPENDENT_AMBULATORY_CARE_PROVIDER_SITE_OTHER): Payer: Medicare Other | Admitting: Otolaryngology

## 2019-07-01 ENCOUNTER — Other Ambulatory Visit: Payer: Self-pay

## 2019-07-01 VITALS — Temp 97.9°F

## 2019-07-01 DIAGNOSIS — H6121 Impacted cerumen, right ear: Secondary | ICD-10-CM | POA: Diagnosis not present

## 2019-07-01 DIAGNOSIS — H6522 Chronic serous otitis media, left ear: Secondary | ICD-10-CM

## 2019-07-01 NOTE — Progress Notes (Signed)
HPI: Diane Giles is a 64 y.o. female who returns today for evaluation of left ear blockage. She has had this for about 4 weeks.  She feels like there is fluid in the ear.  Her right ear is doing well.  She is having no ear pain or discomfort otherwise.  Past Medical History:  Diagnosis Date  . Aortic stenosis    mild-moderate AS 04/22/19 echo  . Aortoiliac occlusive disease ([place]) 12/03/2016  . Arthritis   . Diabetes mellitus without complication ([place])    per pt she is pre- diabetic  . Diverticulitis   . Heart murmur   . Hypertension   . Osteoporosis   . Sleep apnea   . Tobacco use disorder 07/04/2012   Past Surgical History:  Procedure Laterality Date  . ABDOMINAL AORTOGRAM W/LOWER EXTREMITY N/A 12/03/2016   Procedure: Abdominal Aortogram w/Lower Extremity;  Surgeon: Angelia Mould, MD;  Location: [facility];  Service: Cardiovascular;  Laterality: N/A;  . AORTA -INNOMIATE BYPASS N/A 05/18/2019   Procedure: AORTA -INNOMIATE BYPASS Using Hemashield Gold Graft Size 14mm;  Surgeon: Gaye Pollack, MD;  Location: [facility];  Service: Open Heart Surgery;  Laterality: N/A;  . DILATATION & CURETTAGE/HYSTEROSCOPY WITH MYOSURE N/A 12/27/2015   Procedure: DILATATION & CURETTAGE/HYSTEROSCOPY;  Surgeon: Nunzio Cobbs, MD;  Location: [facility];  Service: Gynecology;  Laterality: N/A;  . DILATION AND CURETTAGE OF UTERUS  11/2015  . ELBOW SURGERY Left ~2007   tendon repair by Dr. Veverly Fells  . EYE SURGERY Bilateral 2008   lasik  . JOINT REPLACEMENT    . lower aortic bypass  2000   per patient  . PERIPHERAL VASCULAR INTERVENTION  12/03/2016   Procedure: Peripheral Vascular Intervention;  Surgeon: Angelia Mould, MD;  Location: [facility];  Service: Cardiovascular;;  . STERNOTOMY  05/18/2019   Procedure: Sternotomy;  Surgeon: Gaye Pollack, MD;  Location: [facility];  Service: Open Heart Surgery;;  . TOTAL HIP ARTHROPLASTY Right 07/27/2014   Procedure: RIGHT TOTAL HIP  ARTHROPLASTY ANTERIOR APPROACH;  Surgeon: Paralee Cancel, MD;  Location: [facility];  Service: Orthopedics;  Laterality: Right;   Social History   Socioeconomic History  . Marital status: Married    Spouse name: Not on file  . Number of children: Not on file  . Years of education: Not on file  . Highest education level: Not on file  Occupational History  . Occupation: Art gallery manager  Tobacco Use  . Smoking status: Former Smoker    Years: 40.00    Types: Cigarettes    Quit date: 02/23/2017    Years since quitting: 2.3  . Smokeless tobacco: Never Used  Substance and Sexual Activity  . Alcohol use: Yes    Alcohol/week: 0.0 standard drinks    Comment: very rare--1/month  . Drug use: No  . Sexual activity: Yes    Partners: Male    Birth control/protection: Post-menopausal  Other Topics Concern  . Not on file  Social History Narrative   Married. Patient does not exercise. She has a Gaffer.   Social Determinants of Health   Financial Resource Strain:   . Difficulty of Paying Living Expenses: Not on file  Food Insecurity:   . Worried About Charity fundraiser in the Last Year: Not on file  . Ran Out of Food in the Last Year: Not on file  Transportation Needs:   . Lack of Transportation (Medical): Not on file  . Lack  of Transportation (Non-Medical): Not on file  Physical Activity:   . Days of Exercise per Week: Not on file  . Minutes of Exercise per Session: Not on file  Stress:   . Feeling of Stress : Not on file  Social Connections:   . Frequency of Communication with Friends and Family: Not on file  . Frequency of Social Gatherings with Friends and Family: Not on file  . Attends Religious Services: Not on file  . Active Member of Clubs or Organizations: Not on file  . Attends Archivist Meetings: Not on file  . Marital Status: Not on file   Family History  Problem Relation Age of Onset  . Cancer Father 44       Dec age 20 with pancreatic Ca  .  Migraines Mother   . Thyroid disease Mother        hypothyroid   Allergies  Allergen Reactions  . Codeine Itching   Prior to Admission medications   Medication Sig Start Date End Date Taking? Authorizing Provider  aspirin 81 MG chewable tablet Chew 81 mg by mouth daily.   Yes [provider]  atenolol-chlorthalidone (TENORETIC) 50-25 MG tablet TAKE 1 TABLET IN THE MORNING. Patient taking differently: Take 1 tablet by mouth daily.  08/15/18  Yes Shawnee Knapp, MD  atorvastatin (LIPITOR) 80 MG tablet Take 1 tablet (80 mg total) by mouth daily at 6 PM. 08/16/17  Yes Shawnee Knapp, MD  furosemide (LASIX) 20 MG tablet Take 1 tablet (20 mg total) by mouth every morning. 03/21/18  Yes Shawnee Knapp, MD  gabapentin (NEURONTIN) 100 MG capsule Take 100 mg by mouth 3 (three) times daily. 03/09/19  Yes [provider]  hydrOXYzine (ATARAX/VISTARIL) 25 MG tablet Take 25 mg by mouth 2 (two) times daily. 03/09/19  Yes [provider]  Lancets MISC Use as directed once a day. (DX: R73.9) 01/20/15  Yes Shawnee Knapp, MD  losartan (COZAAR) 25 MG tablet TAKE 1 TABLET IN THE P.M. Patient taking differently: Take 25 mg by mouth every evening.  08/16/17  Yes Shawnee Knapp, MD  metFORMIN (GLUCOPHAGE) 500 MG tablet TAKE 2 TABLETS TWICE DAILY WITH MEALS. Patient taking differently: Take 1,000 mg by mouth 2 (two) times daily with a meal.  08/16/17  Yes Shawnee Knapp, MD  Omega-3 Fatty Acids (OMEGA 3 PO) Take 1 capsule by mouth 2 (two) times daily.   Yes [provider]  traMADol (ULTRAM) 50 MG tablet Take 1 tablet (50 mg total) by mouth every 6 (six) hours as needed for moderate pain. 05/29/19  Yes Gaye Pollack, MD  traZODone (DESYREL) 50 MG tablet Take 50 mg by mouth at bedtime. 04/20/19  Yes [provider]     Positive ROS: Otherwise negative  All other systems have been reviewed and were otherwise negative with the exception of those mentioned in the HPI and as above.  Physical  Exam: Constitutional: Alert, well-appearing, no acute distress Ears: External ears without lesions or tenderness.  She has small ear canals bilaterally.  She had moderate wax buildup on the right side that was cleaned with forceps.  Right TM was clear.  Left ear canal had minimal wax buildup.  She has a left serous otitis media..  Nasal: External nose without lesions. Septum relatively midline.  Moderate rhinitis..  Oral: Lips and gums without lesions. Tongue and palate mucosa without lesions. Posterior oropharynx clear. Neck: No palpable adenopathy or masses Respiratory: Breathing comfortably  Skin: No facial/neck lesions or rash noted.  Cerumen impaction removal  Date/Time: 07/01/2019 12:50 PM Performed by: Rozetta Nunnery, MD Authorized by: Rozetta Nunnery, MD   Consent:    Consent obtained:  Verbal   Consent given by:  Patient   Risks discussed:  Pain and bleeding Procedure details:    Location:  L ear and R ear   Procedure type: curette and forceps   Post-procedure details:    Inspection:  TM intact and canal normal   Hearing quality:  Improved   Patient tolerance of procedure:  Tolerated well, no immediate complications Comments:     Patient has a left serous otitis media.  Right TM is clear    Assessment: Left serous otitis media Mild rhinitis  Plan: Treated with Augmentin 875 mg twice daily for 10 days. Nasacort 2 sprays each nostril at night. Decongestant therapy during the day or morning.  She has history of hypertension and has decongestant tablets that she can take that do not interfere with her hypertension or heart.   Radene Journey, MD

## 2019-07-16 ENCOUNTER — Other Ambulatory Visit: Payer: Self-pay | Admitting: *Deleted

## 2019-07-16 DIAGNOSIS — Z87891 Personal history of nicotine dependence: Secondary | ICD-10-CM

## 2019-07-23 ENCOUNTER — Ambulatory Visit (INDEPENDENT_AMBULATORY_CARE_PROVIDER_SITE_OTHER): Payer: Medicare Other | Admitting: Otolaryngology

## 2019-07-23 ENCOUNTER — Other Ambulatory Visit: Payer: Self-pay

## 2019-07-23 VITALS — Temp 97.3°F

## 2019-07-23 DIAGNOSIS — H6982 Other specified disorders of Eustachian tube, left ear: Secondary | ICD-10-CM

## 2019-07-23 DIAGNOSIS — R42 Dizziness and giddiness: Secondary | ICD-10-CM

## 2019-07-23 NOTE — Progress Notes (Signed)
HPI: Diane Giles is a 64 y.o. female who returns today for evaluation of ear complaints.  She has always had problems with the left ear.  More recently has had some dizziness that comes and goes but happens almost every day with a little bit of loss of balance but no real true vertigo.  Her ear feels better and her hearing is better.  She previously been seen.  4 weeks ago with a left serous otitis media.  She was treated with Nasacort and Augmentin.  Her hearing is better.  Past Medical History:  Diagnosis Date  . Aortic stenosis    mild-moderate AS 04/22/19 echo  . Aortoiliac occlusive disease ([place]) 12/03/2016  . Arthritis   . Diabetes mellitus without complication ([place])    per pt she is pre- diabetic  . Diverticulitis   . Heart murmur   . Hypertension   . Osteoporosis   . Sleep apnea   . Tobacco use disorder 07/04/2012   Past Surgical History:  Procedure Laterality Date  . ABDOMINAL AORTOGRAM W/LOWER EXTREMITY N/A 12/03/2016   Procedure: Abdominal Aortogram w/Lower Extremity;  Surgeon: Angelia Mould, MD;  Location: [facility];  Service: Cardiovascular;  Laterality: N/A;  . AORTA -INNOMIATE BYPASS N/A 05/18/2019   Procedure: AORTA -INNOMIATE BYPASS Using Hemashield Gold Graft Size 94mm;  Surgeon: Gaye Pollack, MD;  Location: [facility];  Service: Open Heart Surgery;  Laterality: N/A;  . DILATATION & CURETTAGE/HYSTEROSCOPY WITH MYOSURE N/A 12/27/2015   Procedure: DILATATION & CURETTAGE/HYSTEROSCOPY;  Surgeon: Nunzio Cobbs, MD;  Location: [facility];  Service: Gynecology;  Laterality: N/A;  . DILATION AND CURETTAGE OF UTERUS  11/2015  . ELBOW SURGERY Left ~2007   tendon repair by Dr. Veverly Fells  . EYE SURGERY Bilateral 2008   lasik  . JOINT REPLACEMENT    . lower aortic bypass  2000   per patient  . PERIPHERAL VASCULAR INTERVENTION  12/03/2016   Procedure: Peripheral Vascular Intervention;  Surgeon: Angelia Mould, MD;  Location: [facility];   Service: Cardiovascular;;  . STERNOTOMY  05/18/2019   Procedure: Sternotomy;  Surgeon: Gaye Pollack, MD;  Location: [facility];  Service: Open Heart Surgery;;  . TOTAL HIP ARTHROPLASTY Right 07/27/2014   Procedure: RIGHT TOTAL HIP ARTHROPLASTY ANTERIOR APPROACH;  Surgeon: Paralee Cancel, MD;  Location: [facility];  Service: Orthopedics;  Laterality: Right;   Social History   Socioeconomic History  . Marital status: Married    Spouse name: Not on file  . Number of children: Not on file  . Years of education: Not on file  . Highest education level: Not on file  Occupational History  . Occupation: Art gallery manager  Tobacco Use  . Smoking status: Former Smoker    Years: 40.00    Types: Cigarettes    Quit date: 02/23/2017    Years since quitting: 2.4  . Smokeless tobacco: Never Used  Substance and Sexual Activity  . Alcohol use: Yes    Alcohol/week: 0.0 standard drinks    Comment: very rare--1/month  . Drug use: No  . Sexual activity: Yes    Partners: Male    Birth control/protection: Post-menopausal  Other Topics Concern  . Not on file  Social History Narrative   Married. Patient does not exercise. She has a Gaffer.   Social Determinants of Health   Financial Resource Strain:   . Difficulty of Paying Living Expenses:   Food Insecurity:   . Worried About Running  Out of Food in the Last Year:   . [place] in the Last Year:   Transportation Needs:   . Lack of Transportation (Medical):   Marland Kitchen Lack of Transportation (Non-Medical):   Physical Activity:   . Days of Exercise per Week:   . Minutes of Exercise per Session:   Stress:   . Feeling of Stress :   Social Connections:   . Frequency of Communication with Friends and Family:   . Frequency of Social Gatherings with Friends and Family:   . Attends Religious Services:   . Active Member of Clubs or Organizations:   . Attends Archivist Meetings:   Marland Kitchen Marital Status:    Family History  Problem Relation Age  of Onset  . Cancer Father 45       Dec age 106 with pancreatic Ca  . Migraines Mother   . Thyroid disease Mother        hypothyroid   Allergies  Allergen Reactions  . Codeine Itching   Prior to Admission medications   Medication Sig Start Date End Date Taking? Authorizing Provider  aspirin 81 MG chewable tablet Chew 81 mg by mouth daily.    [provider]  atenolol-chlorthalidone (TENORETIC) 50-25 MG tablet TAKE 1 TABLET IN THE MORNING. Patient taking differently: Take 1 tablet by mouth daily.  08/15/18   Shawnee Knapp, MD  atorvastatin (LIPITOR) 80 MG tablet Take 1 tablet (80 mg total) by mouth daily at 6 PM. 08/16/17   Shawnee Knapp, MD  furosemide (LASIX) 20 MG tablet Take 1 tablet (20 mg total) by mouth every morning. 03/21/18   Shawnee Knapp, MD  gabapentin (NEURONTIN) 100 MG capsule Take 100 mg by mouth 3 (three) times daily. 03/09/19   [provider]  hydrOXYzine (ATARAX/VISTARIL) 25 MG tablet Take 25 mg by mouth 2 (two) times daily. 03/09/19   [provider]  Lancets MISC Use as directed once a day. (DX: R73.9) 01/20/15   Shawnee Knapp, MD  losartan (COZAAR) 25 MG tablet TAKE 1 TABLET IN THE P.M. Patient taking differently: Take 25 mg by mouth every evening.  08/16/17   Shawnee Knapp, MD  metFORMIN (GLUCOPHAGE) 500 MG tablet TAKE 2 TABLETS TWICE DAILY WITH MEALS. Patient taking differently: Take 1,000 mg by mouth 2 (two) times daily with a meal.  08/16/17   Shawnee Knapp, MD  Omega-3 Fatty Acids (OMEGA 3 PO) Take 1 capsule by mouth 2 (two) times daily.    [provider]  traMADol (ULTRAM) 50 MG tablet Take 1 tablet (50 mg total) by mouth every 6 (six) hours as needed for moderate pain. 05/29/19   Gaye Pollack, MD  traZODone (DESYREL) 50 MG tablet Take 50 mg by mouth at bedtime. 04/20/19   [provider]     Positive ROS: Otherwise negative  All other systems have been reviewed and were otherwise negative with the exception of those mentioned in  the HPI and as above.  Physical Exam: Constitutional: Alert, well-appearing, no acute distress Ears: External ears without lesions or tenderness. Ear canals are clear bilaterally.  On microscopic exam the right TM is clear.  The left TM was slightly retracted but was able insufflate air behind the left TM.  No middle ear effusion noted.  On tuning fork testing she heard symmetric with AC > BC on the left side.  Dix-Hallpike testing revealed no evidence of BPPV. Nasal: External nose without lesions. Septum with  minimal deformity.  Mild rhinitis.  Both middle meatus regions were clear with no signs of infection.. Clear nasal passages Oral: Lips and gums without lesions. Tongue and palate mucosa without lesions. Posterior oropharynx clear. Neck: No palpable adenopathy or masses Respiratory: Breathing comfortably  Skin: No facial/neck lesions or rash noted.  Procedures  Assessment: Chronic left eustachian tube dysfunction. Dizziness questionable etiology probably not vestibular.  Plan: Recommended regular use of the Nasacort 2 sprays each nostril at night.  Also discussed with her concerning trying to pop or equalize pressure in her ears 2 or 3 times a week. She will follow-up if she has any recurrent problems with her ear. Briefly discussed possible myringotomy tube if symptoms recur.   Radene Journey, MD

## 2019-08-31 ENCOUNTER — Ambulatory Visit
Admission: RE | Admit: 2019-08-31 | Discharge: 2019-08-31 | Disposition: A | Discharge status: Home / Self Care | Payer: Medicare Other | Source: Ambulatory Visit | Attending: Acute Care | Admitting: Acute Care

## 2019-08-31 DIAGNOSIS — Z87891 Personal history of nicotine dependence: Secondary | ICD-10-CM

## 2019-09-03 NOTE — Progress Notes (Signed)
Please call patient and let them  know their  low dose Ct was read as a Lung RADS 1, negative study: no nodules or definitely benign nodules. Radiology recommendation is for a repeat LDCT in 12 months. .Please let them  know we will order and schedule their  annual screening scan for 08/2020. Please let them  know there was notation of CAD on their  scan.  Please remind the patient  that this is a non-gated exam therefore degree or severity of disease  cannot be determined. Please have them  follow up with their PCP regarding potential risk factor modification, dietary therapy or pharmacologic therapy if clinically indicated. Pt.  is  currently on statin therapy. Please place order for annual  screening scan for  08/2020 and fax results to PCP. Thanks so much.

## 2019-09-04 ENCOUNTER — Other Ambulatory Visit: Payer: Self-pay | Admitting: *Deleted

## 2019-09-04 DIAGNOSIS — Z87891 Personal history of nicotine dependence: Secondary | ICD-10-CM

## 2019-09-21 ENCOUNTER — Other Ambulatory Visit: Payer: Self-pay | Admitting: Family Medicine

## 2019-09-21 DIAGNOSIS — R2681 Unsteadiness on feet: Secondary | ICD-10-CM

## 2019-10-22 ENCOUNTER — Ambulatory Visit
Admission: RE | Admit: 2019-10-22 | Discharge: 2019-10-22 | Disposition: A | Discharge status: Home / Self Care | Payer: Medicare Other | Source: Ambulatory Visit | Attending: Family Medicine | Admitting: Family Medicine

## 2019-10-22 ENCOUNTER — Other Ambulatory Visit: Payer: Self-pay

## 2019-10-22 ENCOUNTER — Other Ambulatory Visit: Payer: Self-pay | Admitting: Family Medicine

## 2019-10-22 DIAGNOSIS — R2681 Unsteadiness on feet: Secondary | ICD-10-CM

## 2019-11-24 ENCOUNTER — Encounter: Payer: Self-pay | Admitting: Neurology

## 2019-12-09 ENCOUNTER — Ambulatory Visit (INDEPENDENT_AMBULATORY_CARE_PROVIDER_SITE_OTHER): Payer: Medicare Other | Admitting: Physician Assistant

## 2019-12-09 ENCOUNTER — Other Ambulatory Visit: Payer: Self-pay

## 2019-12-09 ENCOUNTER — Ambulatory Visit (HOSPITAL_COMMUNITY)
Admission: RE | Admit: 2019-12-09 | Discharge: 2019-12-09 | Disposition: A | Discharge status: Home / Self Care | Payer: Medicare Other | Source: Ambulatory Visit | Attending: Family Medicine | Admitting: Family Medicine

## 2019-12-09 VITALS — BP 130/59 | HR 65 | Temp 96.4°F | Resp 20 | Ht <= 58 in | Wt 189.9 lb

## 2019-12-09 DIAGNOSIS — I771 Stricture of artery: Secondary | ICD-10-CM | POA: Diagnosis not present

## 2019-12-09 DIAGNOSIS — I739 Peripheral vascular disease, unspecified: Secondary | ICD-10-CM | POA: Diagnosis not present

## 2019-12-09 DIAGNOSIS — I6522 Occlusion and stenosis of left carotid artery: Secondary | ICD-10-CM

## 2019-12-09 NOTE — Progress Notes (Signed)
Office Note     CC:  follow up Requesting Provider:  Margot Ables*  HPI: Diane Giles is a 64 y.o. (1956/03/02) female who presents due to recurrent dizziness. She is s/p Aorto innominate bypass by Dr. Edilia Bo and Dr. Laneta Simmers on 05/18/19 due to dizziness and paresthesias in the right upper extremity and right upper arm weakness. At the time she was found to be a poor candidate for endovascular revascularization so she underwent surgical repair. At her post op visit on 06/17/19 with Dr. Edilia Bo her right upper arm symptoms were resolved as well as her dizziness. She was healing well from her median sternotomy. She also was compliant with her Aspirin and statin. She was not having any lower extremity symptoms such as claudication or rest pain. She did report some lower extremity swelling and was recommended she try compression stockings  She presents today with worsening dizziness, which she states has been present since her last visit in February. Previously the dizziness was more intermittent and now it is daily. She describes it as "feels like my head is in a cloud". She has not noticed any alleviating factors. The dizziness occurs whether or not she is sitting standing etc. She does notice increased severity when lying down or with position changes. She gets feeling of room spinning when she is lying down. She states she is unable to walk in a straight line and has to brace herself along walls at home or she uses a cane. She has seen her PCP and ENT for this but they have not identified any underlying cause. She did a trial of taking Meclizine but she says it did not help  She is also complaining of daily headaches or what she says are more so "head pains" that last 2-5 minutes. The headaches/ sharp pains are never in the same location. This occurs 2-3 x/ day. She has not noticed any aggravating or alleviating factors. She denies any aura, visual changes, photophobia, nausea or vomiting.  She recently had MRI that was ordered by her PCP. The MRI showed some subcortical changes but no other acute abnormalities. She is scheduled to see Neurology in September for evaluation.   She additionally has had one episode of right upper extremity numbness approximately 2 weeks ago. This numbness was similar to what she experienced prior to her aorto innominate bypass. This subsequently resolved and then she explains that the evening before last she also had numbness while sleeping but resolved with changing position of her arm. She otherwise has not had any upper extremity weakness or numbness  She denies any lower extremity claudication symptoms, rest pain or nonhealing wounds. Her exercise tolerance has decreased due to her dizziness so she has not been ambulating as much recently.She has been trying to elevate her legs more frequently  The pt is on a statin for cholesterol management.  The pt is on a daily aspirin.   Other AC: none The pt is on ARB, BB, and diuretic for hypertension.   The pt is diabetic.  Tobacco hx: Former, quit 2018  Past Medical History:  Diagnosis Date  . Aortic stenosis    mild-moderate AS 04/22/19 echo  . Aortoiliac occlusive disease (HCC) 12/03/2016  . Arthritis   . Diabetes mellitus without complication (HCC)    per pt she is pre- diabetic  . Diverticulitis   . Heart murmur   . Hypertension   . Osteoporosis   . Sleep apnea   . Tobacco use disorder 07/04/2012  Past Surgical History:  Procedure Laterality Date  . ABDOMINAL AORTOGRAM W/LOWER EXTREMITY N/A 12/03/2016   Procedure: Abdominal Aortogram w/Lower Extremity;  Surgeon: Chuck Hint, MD;  Location: [facility] INVASIVE CV LAB;  Service: Cardiovascular;  Laterality: N/A;  . AORTA -INNOMIATE BYPASS N/A 05/18/2019   Procedure: AORTA -INNOMIATE BYPASS Using Hemashield Gold Graft Size 8mm;  Surgeon: Alleen Borne, MD;  Location: [facility] OR;  Service: Open Heart Surgery;  Laterality: N/A;  . DILATATION &  CURETTAGE/HYSTEROSCOPY WITH MYOSURE N/A 12/27/2015   Procedure: DILATATION & CURETTAGE/HYSTEROSCOPY;  Surgeon: Patton Salles, MD;  Location: [facility] ORS;  Service: Gynecology;  Laterality: N/A;  . DILATION AND CURETTAGE OF UTERUS  11/2015  . ELBOW SURGERY Left ~2007   tendon repair by Dr. Ranell Patrick  . EYE SURGERY Bilateral 2008   lasik  . JOINT REPLACEMENT    . lower aortic bypass  2000   per patient  . PERIPHERAL VASCULAR INTERVENTION  12/03/2016   Procedure: Peripheral Vascular Intervention;  Surgeon: Chuck Hint, MD;  Location: [facility] INVASIVE CV LAB;  Service: Cardiovascular;;  . STERNOTOMY  05/18/2019   Procedure: Sternotomy;  Surgeon: Alleen Borne, MD;  Location: [facility] OR;  Service: Open Heart Surgery;;  . TOTAL HIP ARTHROPLASTY Right 07/27/2014   Procedure: RIGHT TOTAL HIP ARTHROPLASTY ANTERIOR APPROACH;  Surgeon: Durene Romans, MD;  Location: [facility] ORS;  Service: Orthopedics;  Laterality: Right;    Social History   Socioeconomic History  . Marital status: Married    Spouse name: Not on file  . Number of children: Not on file  . Years of education: Not on file  . Highest education level: Not on file  Occupational History  . Occupation: Tree surgeon  Tobacco Use  . Smoking status: Former Smoker    Years: 40.00    Types: Cigarettes    Quit date: 02/23/2017    Years since quitting: 2.7  . Smokeless tobacco: Never Used  Vaping Use  . Vaping Use: Never used  Substance and Sexual Activity  . Alcohol use: Yes    Alcohol/week: 0.0 standard drinks    Comment: very rare--1/month  . Drug use: No  . Sexual activity: Yes    Partners: Male    Birth control/protection: Post-menopausal  Other Topics Concern  . Not on file  Social History Narrative   Married. Patient does not exercise. She has a Geographical information systems officer.   Social Determinants of Health   Financial Resource Strain:   . Difficulty of Paying Living Expenses:   Food Insecurity:   . Worried About Brewing technologist in the Last Year:   . Barista in the Last Year:   Transportation Needs:   . Freight forwarder (Medical):   Marland Kitchen Lack of Transportation (Non-Medical):   Physical Activity:   . Days of Exercise per Week:   . Minutes of Exercise per Session:   Stress:   . Feeling of Stress :   Social Connections:   . Frequency of Communication with Friends and Family:   . Frequency of Social Gatherings with Friends and Family:   . Attends Religious Services:   . Active Member of Clubs or Organizations:   . Attends Banker Meetings:   Marland Kitchen Marital Status:   Intimate Partner Violence:   . Fear of Current or Ex-Partner:   . Emotionally Abused:   Marland Kitchen Physically Abused:   . Sexually Abused:     Family History  Problem Relation Age of  Onset  . Cancer Father 79       Dec age 30 with pancreatic Ca  . Migraines Mother   . Thyroid disease Mother        hypothyroid    Current Outpatient Medications  Medication Sig Dispense Refill  . aspirin 81 MG chewable tablet Chew 81 mg by mouth daily.    Marland Kitchen atenolol-chlorthalidone (TENORETIC) 50-25 MG tablet TAKE 1 TABLET IN THE MORNING. (Patient taking differently: Take 1 tablet by mouth daily. ) 30 tablet 0  . atorvastatin (LIPITOR) 80 MG tablet Take 1 tablet (80 mg total) by mouth daily at 6 PM. 90 tablet 3  . clobetasol cream (TEMOVATE) 0.05 %     . furosemide (LASIX) 20 MG tablet Take 1 tablet (20 mg total) by mouth every morning. 90 tablet 1  . hydrOXYzine (ATARAX/VISTARIL) 25 MG tablet Take 25 mg by mouth 2 (two) times daily.    . Lancets MISC Use as directed once a day. (DX: R73.9) 100 each 2  . losartan (COZAAR) 25 MG tablet TAKE 1 TABLET IN THE P.M. (Patient taking differently: Take 25 mg by mouth every evening. ) 90 tablet 3  . metFORMIN (GLUCOPHAGE) 500 MG tablet TAKE 2 TABLETS TWICE DAILY WITH MEALS. (Patient taking differently: Take 1,000 mg by mouth 2 (two) times daily with a meal. ) 360 tablet 3  . Omega-3 Fatty Acids (OMEGA 3  PO) Take 1 capsule by mouth 2 (two) times daily.    . traMADol (ULTRAM) 50 MG tablet Take 1 tablet (50 mg total) by mouth every 6 (six) hours as needed for moderate pain. 20 tablet 0   No current facility-administered medications for this visit.    Allergies  Allergen Reactions  . Codeine Itching     REVIEW OF SYSTEMS:  [X]  denotes positive finding, [ ]  denotes negative finding Cardiac  Comments:  Chest pain or chest pressure:    Shortness of breath upon exertion:    Short of breath when lying flat:    Irregular heart rhythm:        Vascular    Pain in calf, thigh, or hip brought on by ambulation:    Pain in feet at night that wakes you up from your sleep:     Blood clot in your veins:    Leg swelling:         Pulmonary    Oxygen at home:    Productive cough:     Wheezing:         Neurologic    Sudden weakness in arms or legs:     Sudden numbness in arms or legs:     Sudden onset of difficulty speaking or slurred speech:    Temporary loss of vision in one eye:     Problems with dizziness:         Gastrointestinal    Blood in stool:     Vomited blood:         Genitourinary    Burning when urinating:     Blood in urine:        Psychiatric    Major depression:         Hematologic    Bleeding problems:    Problems with blood clotting too easily:        Skin    Rashes or ulcers:        Constitutional    Fever or chills:      PHYSICAL EXAMINATION:  Vitals:   12/09/19  1035  BP: (!) 130/59  Pulse: 65  Resp: 20  Temp: (!) 96.4 F (35.8 C)  TempSrc: Temporal  SpO2: 98%  Weight: 189 lb 14.4 oz (86.1 kg)  Height: 4\' 10"  (1.473 m)    General:  WDWN in NAD; vital signs documented above Gait: unsteady HENT: WNL, normocephalic Pulmonary: normal non-labored breathing , without wheezing Cardiac: regular HR, without  Murmurs without carotid bruit. Right Subclavian bruit Abdomen: soft, NT, no masses Vascular Exam/Pulses:  Right Left  Radial 2+ (normal)  2+ (normal)  Ulnar 2+ (normal) 2+ (normal)  Femoral 2+ (normal) 2+ (normal)  Popliteal Not palpable Not palpable  DP 2+ (normal) 2+ (normal)  PT Not palpable Not palpable   Extremities: without ischemic changes, without Gangrene , without cellulitis; without open wounds;  Musculoskeletal: no muscle wasting or atrophy  Neurologic: A&O X 3;  No focal weakness or paresthesias are detected Psychiatric:  The pt has Normal affect.   Non-Invasive Vascular Imaging:   12/09/19 Carotid Duplex: Right Carotid Findings:  +----------+--------+--------+--------+------------------+--------+       PSV cm/sEDV cm/sStenosisPlaque DescriptionComments  +----------+--------+--------+--------+------------------+--------+  CCA Prox 65   13                      +----------+--------+--------+--------+------------------+--------+  CCA Mid  76   12                      +----------+--------+--------+--------+------------------+--------+  CCA Distal80   16                      +----------+--------+--------+--------+------------------+--------+  ICA Prox 132   31   1-39%  heterogenous         +----------+--------+--------+--------+------------------+--------+  ICA Mid  87   25                      +----------+--------+--------+--------+------------------+--------+  ICA Distal85   25                      +----------+--------+--------+--------+------------------+--------+  ECA    99   12                      +----------+--------+--------+--------+------------------+--------+   +----------+--------+-------+----------------+-------------------+       PSV cm/sEDV cmsDescribe    Arm Pressure (mmHG)  +----------+--------+-------+----------------+-------------------+  Subclavian182        Multiphasic, WNL            +----------+--------+-------+----------------+-------------------+   +---------+--------+--+--------+--+---------+  VertebralPSV cm/s81EDV cm/s15Antegrade  +---------+--------+--+--------+--+---------+      Left Carotid Findings:  +----------+--------+--------+--------+------------------+--------+       PSV cm/sEDV cm/sStenosisPlaque DescriptionComments  +----------+--------+--------+--------+------------------+--------+  CCA Prox 123   24                      +----------+--------+--------+--------+------------------+--------+  CCA Mid  100   24                      +----------+--------+--------+--------+------------------+--------+  CCA Distal91   21                      +----------+--------+--------+--------+------------------+--------+  ICA Prox 110   29   1-39%  heterogenous         +----------+--------+--------+--------+------------------+--------+  ICA Mid  113   30                      +----------+--------+--------+--------+------------------+--------+  ICA  [id number]   36                      +----------+--------+--------+--------+------------------+--------+  ECA    121   10                      +----------+--------+--------+--------+------------------+--------+   +----------+--------+--------+----------------+-------------------+       PSV cm/sEDV cm/sDescribe    Arm Pressure (mmHG)  +----------+--------+--------+----------------+-------------------+  [id number]       Multiphasic, WNL            +----------+--------+--------+----------------+-------------------+   +---------+--------+--+--------+--+---------+  VertebralPSV cm/s94EDV cm/s20Antegrade    +---------+--------+--+--------+--+---------+     ASSESSMENT/PLAN:: 64 y.o. female here for follow up for aorto- innominate bypass by Dr. Edilia Bo and Dr. Laneta Simmers on 05/18/19 due to dizziness and paresthesias in the right upper extremity and right upper arm weakness. She was doing well post operatively with resolution of her symptoms. She now presents with worsening dizziness and headaches. Her carotid duplex today does not show any signs of stenosis that would explain her current symptomatology. She has patent bilaterally ICA's, vertebral and subclavian arteries. Clinically her bypass appears to be patent with palpable upper extremity pulses and no loss of motor or sensation in right arm. - She will continue her Aspirin and Statin - She will keep her follow up with Neurology in September  - Discussed patient with Dr. Edilia Bo and he agrees that we should evaluate the bypass with CTA Neck and Chest - I have scheduled her for a CTA Neck and CTA Chest/Aorta  - She will follow up with Dr. Edilia Bo after CTA to review results   Graceann Congress, PA-C [facility] [phone]  Clinic MD:  Dr. Edilia Bo

## 2019-12-16 ENCOUNTER — Other Ambulatory Visit: Payer: Self-pay

## 2019-12-16 DIAGNOSIS — I6522 Occlusion and stenosis of left carotid artery: Secondary | ICD-10-CM

## 2019-12-16 DIAGNOSIS — I771 Stricture of artery: Secondary | ICD-10-CM

## 2020-01-06 ENCOUNTER — Ambulatory Visit
Admission: RE | Admit: 2020-01-06 | Discharge: 2020-01-06 | Disposition: A | Discharge status: Home / Self Care | Payer: Medicare Other | Source: Ambulatory Visit | Attending: Vascular Surgery | Admitting: Vascular Surgery

## 2020-01-06 DIAGNOSIS — I6522 Occlusion and stenosis of left carotid artery: Secondary | ICD-10-CM

## 2020-01-06 DIAGNOSIS — I771 Stricture of artery: Secondary | ICD-10-CM

## 2020-01-06 MED ORDER — IOPAMIDOL (ISOVUE-370) INJECTION 76%
75.0000 mL | Freq: Once | INTRAVENOUS | Status: AC | PRN
  Administered 2020-01-06: 75 mL via INTRAVENOUS

## 2020-01-06 NOTE — Progress Notes (Signed)
NEUROLOGY CONSULTATION NOTE  TSUYUKO MADURA MRN: [id number] DOB: [birth year]  Referring provider: Margot Ables, MD Primary care provider: Margot Ables, MD  Reason for consult:  Gait imbalance  HISTORY OF PRESENT ILLNESS: Diane Giles is a 64 year old right-handed female with HTN, type 2 diabetes mellitus, and peripheral arterial disease with aortoiliac occlusive disease, left carotid stenosis and right subclavian artery stenosis who presents for gait imbalance.  She is accompanied by her husband who provides collateral history.  History supplemented by referring provider's notes.  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  Wobbles on feet.  Holds onto walls.  No presyncopal.  No falls.  Only time feels spinning is laying down at night.  Legs will feel like going to give out and will need to stop and sensation comes back  Sometimes sitting and will get fuzzy head.  No effecting vision or hearing.  She reports unsteady gait since undergoing surgery for innominate artery stenosis.  While she did have some brief dizziness, her balance disorder is not associated with dizziness.  She feels wobbly on her feet and needs to hold onto furniture or the wall to keep steady.  Sometimes she feels like her legs will give out, at which point she just stops walking and waits for the sensation to pass.  She denies neck or back pain.  She denies numbness in the feet.  She denies double vision.  The only time she reports vertigo is if she is laying in bed.  She saw the ophthalmologist who found no ocular cause for her symptoms.  She saw ENT who diagnosed otitis media and eustachian tube dysfunction.  She has had no change in medications.  Blood pressure has been stable.  Since onset, symptoms have been unchanged.  MRI of brain without contrast performed on 10/22/2019 personally reviewed showed nonspecific periventricular and subcortical white matter changes in the  bilateral cerebral hemispheres, likely chronic small vessel disease but no posterior fossa lesion or ischemia involving the brainstem and cerebellum.  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  CTA of neck from 01/06/2020 showed limited evaluation of heavily diseased intracranial right ICA with stenoses as well as thorough evaluation of extracranial vasculature demonstrating 35% proximal right ICA stenosis with aorto-innominate bypass and patent graft and patent left ICA and bilateral vertebral arteries.  She also has type 2 diabetes.  Hgb A1c from January was 6.3.    PAST MEDICAL HISTORY: Past Medical History:  Diagnosis Date  . Aortic stenosis    mild-moderate AS 04/22/19 echo  . Aortoiliac occlusive disease (HCC) 12/03/2016  . Arthritis   . Diabetes mellitus without complication (HCC)    per pt she is pre- diabetic  . Diverticulitis   . Heart murmur   . Hypertension   . Osteoporosis   . Sleep apnea   . Tobacco use disorder 07/04/2012    PAST SURGICAL HISTORY: Past Surgical History:  Procedure Laterality Date  . ABDOMINAL AORTOGRAM W/LOWER EXTREMITY N/A 12/03/2016   Procedure: Abdominal Aortogram w/Lower Extremity;  Surgeon: Chuck Hint, MD;  Location: [facility] INVASIVE CV LAB;  Service: Cardiovascular;  Laterality: N/A;  . AORTA -INNOMIATE BYPASS N/A 05/18/2019   Procedure: AORTA -INNOMIATE BYPASS Using Hemashield Gold Graft Size 8mm;  Surgeon: Alleen Borne, MD;  Location: [facility] OR;  Service: Open Heart Surgery;  Laterality: N/A;  . DILATATION & CURETTAGE/HYSTEROSCOPY WITH MYOSURE N/A 12/27/2015  Procedure: DILATATION & CURETTAGE/HYSTEROSCOPY;  Surgeon: Patton Salles, MD;  Location: [facility] ORS;  Service: Gynecology;  Laterality: N/A;  . DILATION AND CURETTAGE OF UTERUS  11/2015  . ELBOW SURGERY Left ~2007   tendon repair by Dr. Ranell Patrick  . EYE SURGERY Bilateral  2008   lasik  . JOINT REPLACEMENT    . lower aortic bypass  2000   per patient  . PERIPHERAL VASCULAR INTERVENTION  12/03/2016   Procedure: Peripheral Vascular Intervention;  Surgeon: Chuck Hint, MD;  Location: [facility] INVASIVE CV LAB;  Service: Cardiovascular;;  . STERNOTOMY  05/18/2019   Procedure: Sternotomy;  Surgeon: Alleen Borne, MD;  [facility];  Service: Open Heart Surgery;;  . TOTAL HIP ARTHROPLASTY Right 07/27/2014   Procedure: RIGHT TOTAL HIP ARTHROPLASTY ANTERIOR APPROACH;  Surgeon: Durene Romans, MD;  Location: [facility] ORS;  Service: Orthopedics;  Laterality: Right;    MEDICATIONS: Current Outpatient Medications on File Prior to Visit  Medication Sig Dispense Refill  . aspirin 81 MG chewable tablet Chew 81 mg by mouth daily.    Marland Kitchen atenolol-chlorthalidone (TENORETIC) 50-25 MG tablet TAKE 1 TABLET IN THE MORNING. (Patient taking differently: Take 1 tablet by mouth daily. ) 30 tablet 0  . atorvastatin (LIPITOR) 80 MG tablet Take 1 tablet (80 mg total) by mouth daily at 6 PM. 90 tablet 3  . clobetasol cream (TEMOVATE) 0.05 %     . furosemide (LASIX) 20 MG tablet Take 1 tablet (20 mg total) by mouth every morning. 90 tablet 1  . hydrOXYzine (ATARAX/VISTARIL) 25 MG tablet Take 25 mg by mouth 2 (two) times daily.    . Lancets MISC Use as directed once a day. (DX: R73.9) 100 each 2  . losartan (COZAAR) 25 MG tablet TAKE 1 TABLET IN THE P.M. (Patient taking differently: Take 25 mg by mouth every evening. ) 90 tablet 3  . metFORMIN (GLUCOPHAGE) 500 MG tablet TAKE 2 TABLETS TWICE DAILY WITH MEALS. (Patient taking differently: Take 1,000 mg by mouth 2 (two) times daily with a meal. ) 360 tablet 3  . Omega-3 Fatty Acids (OMEGA 3 PO) Take 1 capsule by mouth 2 (two) times daily.    . traMADol (ULTRAM) 50 MG tablet Take 1 tablet (50 mg total) by mouth every 6 (six) hours as needed for moderate pain. 20 tablet 0   No current facility-administered medications on file prior to visit.     ALLERGIES: Allergies  Allergen Reactions  . Codeine Itching    FAMILY HISTORY: Family History  Problem Relation Age of Onset  . Cancer Father 45       Dec age 97 with pancreatic Ca  . Migraines Mother   . Thyroid disease Mother        hypothyroid   SOCIAL HISTORY: Social History   Socioeconomic History  . Marital status: Married    Spouse name: Not on file  . Number of children: Not on file  . Years of education: Not on file  . Highest education level: Not on file  Occupational History  . Occupation: Tree surgeon  Tobacco Use  . Smoking status: Former Smoker    Years: 40.00    Types: Cigarettes    Quit date: 02/23/2017    Years since quitting: 2.8  . Smokeless tobacco: Never Used  Vaping Use  . Vaping Use: Never used  Substance and Sexual Activity  . Alcohol use: Yes    Alcohol/week: 0.0 standard drinks    Comment: very rare--1/month  .  Drug use: No  . Sexual activity: Yes    Partners: Male    Birth control/protection: Post-menopausal  Other Topics Concern  . Not on file  Social History Narrative   Married. Patient does not exercise. She has a Geographical information systems officer.   Social Determinants of Health   Financial Resource Strain:   . Difficulty of Paying Living Expenses: Not on file  Food Insecurity:   . Worried About Programme researcher, broadcasting/film/video in the Last Year: Not on file  . Ran Out of Food in the Last Year: Not on file  Transportation Needs:   . Lack of Transportation (Medical): Not on file  . Lack of Transportation (Non-Medical): Not on file  Physical Activity:   . Days of Exercise per Week: Not on file  . Minutes of Exercise per Session: Not on file  Stress:   . Feeling of Stress : Not on file  Social Connections:   . Frequency of Communication with Friends and Family: Not on file  . Frequency of Social Gatherings with Friends and Family: Not on file  . Attends Religious Services: Not on file  . Active Member of Clubs or Organizations: Not on file  .  Attends Banker Meetings: Not on file  . Marital Status: Not on file  Intimate Partner Violence:   . Fear of Current or Ex-Partner: Not on file  . Emotionally Abused: Not on file  . Physically Abused: Not on file  . Sexually Abused: Not on file    PHYSICAL EXAM: Blood pressure (!) 155/62, pulse 76, height 4\' 10"  (1.473 m), weight 194 lb (88 kg), last menstrual period 05/07/2005, SpO2 98 %. General: No acute distress.  Patient appears well-groomed.  Head:  Normocephalic/atraumatic Eyes:  fundi examined but not visualized Neck: supple, no paraspinal tenderness, full range of motion Back: No paraspinal tenderness Heart: regular rate and rhythm Lungs: Clear to auscultation bilaterally. Vascular: No carotid bruits. Neurological Exam: Mental status: alert and oriented to person, place, and time, recent and remote memory intact, fund of knowledge intact, attention and concentration intact, speech fluent and not dysarthric, language intact. Cranial nerves: CN I: not tested CN II: pupils equal, round and reactive to light, visual fields intact CN III, IV, VI:  full range of motion, no nystagmus, no ptosis CN V: facial sensation intact CN VII: upper and lower face symmetric CN VIII: hearing intact CN IX, X: gag intact, uvula midline CN XI: sternocleidomastoid and trapezius muscles intact CN XII: tongue midline Bulk & Tone: normal, no fasciculations. Motor:  5/5 throughout  Sensation:  Pinprick and vibration sensation intact.   Deep Tendon Reflexes:  2+ throughout, toes downgoing.  Finger to nose testing:  Without dysmetria.   Heel to shin:  Without dysmetria.   Gait:  Ataxic.  Able to turn.  Unable to tandem walk.  Romberg positive.  IMPRESSION: Ataxia.  Onset after cardiothoracic surgery.  Not related to dizziness.  She does not exhibit any objective lower extremity weakness and sensory deficits.  She does not exhibit any upper motor signs to suggest myelopathy. Unclear if  she may have had a perioperative transient vertebrobasilar hypoperfusion, despite no evidence of ischemia on brain MRI.  Given her arterial disease, I would like to evaluate for vertebrobasilar insufficiency.   PLAN: 1.  CTA of head 2.  Physical therapy for ataxia 3.  Follow up after testing.  Further recommendations pending results.  Thank you for allowing me to take part in the care of this  patient.  Shon Millet, DO  CC: Margot Ables, MD

## 2020-01-07 ENCOUNTER — Other Ambulatory Visit: Payer: Self-pay

## 2020-01-07 ENCOUNTER — Encounter: Payer: Self-pay | Admitting: Neurology

## 2020-01-07 ENCOUNTER — Ambulatory Visit (INDEPENDENT_AMBULATORY_CARE_PROVIDER_SITE_OTHER): Payer: Medicare Other | Admitting: Neurology

## 2020-01-07 VITALS — BP 155/62 | HR 76 | Ht <= 58 in | Wt 194.0 lb

## 2020-01-07 DIAGNOSIS — R270 Ataxia, unspecified: Secondary | ICD-10-CM

## 2020-01-07 DIAGNOSIS — I679 Cerebrovascular disease, unspecified: Secondary | ICD-10-CM

## 2020-01-07 DIAGNOSIS — I6522 Occlusion and stenosis of left carotid artery: Secondary | ICD-10-CM | POA: Diagnosis not present

## 2020-01-07 NOTE — Patient Instructions (Signed)
I would like to check the blood vessels in your head In the meantime, I will refer you to physical therapy to help with your walking.

## 2020-01-13 ENCOUNTER — Encounter: Payer: Self-pay | Admitting: Vascular Surgery

## 2020-01-13 ENCOUNTER — Ambulatory Visit (INDEPENDENT_AMBULATORY_CARE_PROVIDER_SITE_OTHER): Payer: Medicare Other | Admitting: Vascular Surgery

## 2020-01-13 ENCOUNTER — Other Ambulatory Visit: Payer: Self-pay

## 2020-01-13 VITALS — BP 132/78 | HR 71 | Temp 97.6°F | Resp 20 | Ht <= 58 in | Wt 198.0 lb

## 2020-01-13 DIAGNOSIS — I7409 Other arterial embolism and thrombosis of abdominal aorta: Secondary | ICD-10-CM | POA: Diagnosis not present

## 2020-01-13 DIAGNOSIS — I771 Stricture of artery: Secondary | ICD-10-CM | POA: Diagnosis not present

## 2020-01-13 DIAGNOSIS — I6522 Occlusion and stenosis of left carotid artery: Secondary | ICD-10-CM | POA: Diagnosis not present

## 2020-01-13 NOTE — Progress Notes (Signed)
REASON FOR VISIT:   Follow-up after CT angiogram chest and neck  MEDICAL ISSUES:   STATUS POST AORTO INNOMINATE BYPASS: The CT angiogram shows that the aorto innominate bypass graft is patent without areas of significant stenosis.  There is no significant carotid disease.  She has had some problems with unsteady gait although in the last several days she has not had any symptoms.  She is being seen by neurology and is scheduled for CT of the head.  I explained that our on our study she did have some intracranial carotid disease on the right but this would not likely explain her unsteady gait.  I plan on seeing her back in 1 year with follow-up ABIs that she is undergone previous aortoiliac bypass and a right iliac stent.  She is not a smoker.  She is on aspirin and is on a statin.    HPI:   Diane Giles is a pleasant 64 y.o. female who I last saw on 06/17/2019 for follow-up after aorto innominate bypass.  The patient had presented with right upper extremity weakness and paresthesias.  She had a significant innominate artery stenosis with significant calcium and this was a poor candidate for endovascular approach.  On 05/18/2019 she underwent an aorto innominate bypass by Dr. Cyndia Bent and myself.  When I saw her last the symptoms in her right arm had resolved.  She was not have any further problems with dizziness.  She was not a smoker and she was on aspirin and a statin.  In addition, the patient is undergone a previous aortoiliac bypass in 2000 and developed a stenosis in the proximal right limb of her graft and underwent angioplasty and stenting with a covered stent.  The patient was seen by the physicians assistant on 12/09/2019 with some recurrent dizziness.  Carotid duplex at that time showed no significant stenosis.  She had palpable upper extremity pulses but I felt that we should evaluate her bypass with a CT angiogram of the neck and chest.  She comes in to discuss those results.  She  has been seen by neurology to evaluate her unsteady gait and she is scheduled for CT of the head.  She tells me that over the last several days she has not had any problems with unsteady gait.  She denies any focal weakness or paresthesias.  She denies any expressive or receptive aphasia.  She is had no upper extremity symptoms.  Past Medical History:  Diagnosis Date  . Aortic stenosis    mild-moderate AS 04/22/19 echo  . Aortoiliac occlusive disease ([place]) 12/03/2016  . Arthritis   . Diabetes mellitus without complication ([place])    per pt she is pre- diabetic  . Diverticulitis   . Heart murmur   . Hypertension   . Osteoporosis   . Sleep apnea   . Tobacco use disorder 07/04/2012    Family History  Problem Relation Age of Onset  . Cancer Father 57       Dec age 2 with pancreatic Ca  . Migraines Mother   . Thyroid disease Mother        hypothyroid    SOCIAL HISTORY: Social History   Tobacco Use  . Smoking status: Former Smoker    Years: 40.00    Types: Cigarettes    Quit date: 02/23/2017    Years since quitting: 2.8  . Smokeless tobacco: Never Used  Substance Use Topics  . Alcohol use: Yes    Alcohol/week: 0.0 standard  drinks    Comment: very rare--1/month    Allergies  Allergen Reactions  . Codeine Itching    Current Outpatient Medications  Medication Sig Dispense Refill  . aspirin 81 MG chewable tablet Chew 81 mg by mouth daily.    Marland Kitchen atenolol-chlorthalidone (TENORETIC) 50-25 MG tablet TAKE 1 TABLET IN THE MORNING. (Patient taking differently: Take 1 tablet by mouth daily. ) 30 tablet 0  . atorvastatin (LIPITOR) 80 MG tablet Take 1 tablet (80 mg total) by mouth daily at 6 PM. 90 tablet 3  . clobetasol cream (TEMOVATE) 0.05 %     . furosemide (LASIX) 20 MG tablet Take 1 tablet (20 mg total) by mouth every morning. 90 tablet 1  . hydrOXYzine (ATARAX/VISTARIL) 25 MG tablet Take 25 mg by mouth 2 (two) times daily.    . Lancets MISC Use as directed once a day. (DX:  R73.9) 100 each 2  . losartan (COZAAR) 25 MG tablet TAKE 1 TABLET IN THE P.M. (Patient taking differently: Take 25 mg by mouth every evening. ) 90 tablet 3  . meclizine (ANTIVERT) 25 MG tablet Take 25 mg by mouth 3 (three) times daily.    . metFORMIN (GLUCOPHAGE) 500 MG tablet TAKE 2 TABLETS TWICE DAILY WITH MEALS. (Patient taking differently: Take 1,000 mg by mouth 2 (two) times daily with a meal. ) 360 tablet 3  . Omega-3 Fatty Acids (OMEGA 3 PO) Take 1 capsule by mouth 2 (two) times daily.    . pregabalin (LYRICA) 75 MG capsule Take 75 mg by mouth 2 (two) times daily.    . traMADol (ULTRAM) 50 MG tablet Take 1 tablet (50 mg total) by mouth every 6 (six) hours as needed for moderate pain. 20 tablet 0  . traZODone (DESYREL) 50 MG tablet Take 50 mg by mouth at bedtime.     No current facility-administered medications for this visit.    REVIEW OF SYSTEMS:  [X]  denotes positive finding, [ ]  denotes negative finding Cardiac  Comments:  Chest pain or chest pressure:    Shortness of breath upon exertion:    Short of breath when lying flat:    Irregular heart rhythm:        Vascular    Pain in calf, thigh, or hip brought on by ambulation:    Pain in feet at night that wakes you up from your sleep:     Blood clot in your veins:    Leg swelling:         Pulmonary    Oxygen at home:    Productive cough:     Wheezing:         Neurologic    Sudden weakness in arms or legs:     Sudden numbness in arms or legs:     Sudden onset of difficulty speaking or slurred speech:    Temporary loss of vision in one eye:     Problems with dizziness:  x       Gastrointestinal    Blood in stool:     Vomited blood:         Genitourinary    Burning when urinating:     Blood in urine:        Psychiatric    Major depression:         Hematologic    Bleeding problems:    Problems with blood clotting too easily:        Skin    Rashes or ulcers:  Constitutional    Fever or chills:      PHYSICAL EXAM:   Vitals:   01/13/20 0914 01/13/20 0915  BP: 137/79 132/78  Pulse: 71   Resp: 20   Temp: 97.6 F (36.4 C)   SpO2: 97%   Weight: 198 lb (89.8 kg)   Height: 4\' 10"  (1.473 m)     GENERAL: The patient is a well-nourished female, in no acute distress. The vital signs are documented above. CARDIAC: There is a regular rate and rhythm.  VASCULAR: I do not detect carotid bruits. I do not palpate pedal pulses however both feet are warm and well perfused. PULMONARY: There is good air exchange bilaterally without wheezing or rales. ABDOMEN: Soft and non-tender with normal pitched bowel sounds.  MUSCULOSKELETAL: There are no major deformities or cyanosis. NEUROLOGIC: No focal weakness or paresthesias are detected. SKIN: There are no ulcers or rashes noted. PSYCHIATRIC: The patient has a normal affect.  DATA:    CT ANGIOGRAM NECK AND CHEST: I reviewed the images of her CT angiogram.  This shows that her aorto innominate bypass graft is widely patent.  She has only minimal 35% proximal right ICA stenosis.  She does have intracranial disease on the right with multiple moderate and severe stenoses.  The left carotid and vertebral arteries are patent.  [name] [facility] [phone]

## 2020-01-19 ENCOUNTER — Ambulatory Visit
Admission: RE | Admit: 2020-01-19 | Discharge: 2020-01-19 | Disposition: A | Discharge status: Home / Self Care | Payer: Medicare Other | Source: Ambulatory Visit | Attending: Neurology | Admitting: Neurology

## 2020-01-19 DIAGNOSIS — I679 Cerebrovascular disease, unspecified: Secondary | ICD-10-CM

## 2020-01-19 DIAGNOSIS — R270 Ataxia, unspecified: Secondary | ICD-10-CM

## 2020-01-19 MED ORDER — IOPAMIDOL (ISOVUE-370) INJECTION 76%
75.0000 mL | Freq: Once | INTRAVENOUS | Status: AC | PRN
  Administered 2020-01-19: 75 mL via INTRAVENOUS

## 2020-07-11 ENCOUNTER — Other Ambulatory Visit: Payer: Self-pay

## 2020-07-11 ENCOUNTER — Ambulatory Visit (INDEPENDENT_AMBULATORY_CARE_PROVIDER_SITE_OTHER): Payer: Medicare Other | Admitting: Podiatry

## 2020-07-11 ENCOUNTER — Ambulatory Visit (INDEPENDENT_AMBULATORY_CARE_PROVIDER_SITE_OTHER): Payer: Medicare Other

## 2020-07-11 DIAGNOSIS — M7751 Other enthesopathy of right foot: Secondary | ICD-10-CM

## 2020-07-11 DIAGNOSIS — R609 Edema, unspecified: Secondary | ICD-10-CM

## 2020-07-11 DIAGNOSIS — M7750 Other enthesopathy of unspecified foot: Secondary | ICD-10-CM

## 2020-07-11 DIAGNOSIS — G629 Polyneuropathy, unspecified: Secondary | ICD-10-CM | POA: Diagnosis not present

## 2020-07-11 DIAGNOSIS — M7752 Other enthesopathy of left foot: Secondary | ICD-10-CM

## 2020-07-11 NOTE — Patient Instructions (Signed)
You can leave the unna boot on for about 5 days. If you notice any increase in swelling, toe discoloration go ahead and cut it off.

## 2020-07-15 NOTE — Progress Notes (Signed)
Subjective:   Patient ID: Diane Giles, female   DOB: 65 y.o.   MRN: [id number]   HPI 65 year old female presents the office today for concerns of bilateral foot pain with the right side worse than left.  She has noticed swelling on the right side.  She states the pain moves around on the right side.  She states that only the toes on the left side are affected.  She previously was on Medrol Dosepak which did help but the pain did come back after she stopped it.  She is feels that her heel is "hot" at night at times is difficult to flex her foot.  She denies any recent injury or trauma.  She is diabetic last A1c was 6.3.  She is on Lyrica   Review of Systems  All other systems reviewed and are negative.  Past Medical History:  Diagnosis Date  . Aortic stenosis    mild-moderate AS 04/22/19 echo  . Aortoiliac occlusive disease ([place]) 12/03/2016  . Arthritis   . Diabetes mellitus without complication ([place])    per pt she is pre- diabetic  . Diverticulitis   . Heart murmur   . Hypertension   . Osteoporosis   . Sleep apnea   . Tobacco use disorder 07/04/2012    Past Surgical History:  Procedure Laterality Date  . ABDOMINAL AORTOGRAM W/LOWER EXTREMITY N/A 12/03/2016   Procedure: Abdominal Aortogram w/Lower Extremity;  Surgeon: Angelia Mould, MD;  Location: [place] CV LAB;  Service: Cardiovascular;  Laterality: N/A;  . AORTA -INNOMIATE BYPASS N/A 05/18/2019   Procedure: AORTA -INNOMIATE BYPASS Using Hemashield Gold Graft Size 61mm;  Surgeon: Gaye Pollack, MD;  Location: [facility] OR;  Service: Open Heart Surgery;  Laterality: N/A;  . DILATATION & CURETTAGE/HYSTEROSCOPY WITH MYOSURE N/A 12/27/2015   Procedure: DILATATION & CURETTAGE/HYSTEROSCOPY;  Surgeon: Nunzio Cobbs, MD;  Location: [place] ORS;  Service: Gynecology;  Laterality: N/A;  . DILATION AND CURETTAGE OF UTERUS  11/2015  . ELBOW SURGERY Left ~2007   tendon repair by Dr. Veverly Fells  . EYE SURGERY Bilateral 2008    lasik  . JOINT REPLACEMENT    . lower aortic bypass  2000   per patient  . PERIPHERAL VASCULAR INTERVENTION  12/03/2016   Procedure: Peripheral Vascular Intervention;  Surgeon: Angelia Mould, MD;  Location: [place] CV LAB;  Service: Cardiovascular;;  . STERNOTOMY  05/18/2019   Procedure: Sternotomy;  Surgeon: Gaye Pollack, MD;  Location: [facility] OR;  Service: Open Heart Surgery;;  . TOTAL HIP ARTHROPLASTY Right 07/27/2014   Procedure: RIGHT TOTAL HIP ARTHROPLASTY ANTERIOR APPROACH;  Surgeon: Paralee Cancel, MD;  Location: [facility] ORS;  Service: Orthopedics;  Laterality: Right;     Current Outpatient Medications:  .  aspirin 81 MG chewable tablet, Chew 81 mg by mouth daily., Disp: , Rfl:  .  atenolol (TENORMIN) 50 MG tablet, Take 50 mg by mouth every morning., Disp: , Rfl:  .  atenolol-chlorthalidone (TENORETIC) 50-25 MG tablet, TAKE 1 TABLET IN THE MORNING. (Patient taking differently: Take 1 tablet by mouth daily. ), Disp: 30 tablet, Rfl: 0 .  atorvastatin (LIPITOR) 80 MG tablet, Take 1 tablet (80 mg total) by mouth daily at 6 PM., Disp: 90 tablet, Rfl: 3 .  clobetasol cream (TEMOVATE) 0.05 %, , Disp: , Rfl:  .  furosemide (LASIX) 20 MG tablet, Take 1 tablet (20 mg total) by mouth every morning., Disp: 90 tablet, Rfl: 1 .  hydrOXYzine (ATARAX/VISTARIL) 25 MG tablet,  Take 25 mg by mouth 2 (two) times daily., Disp: , Rfl:  .  Lancets MISC, Use as directed once a day. (DX: R73.9), Disp: 100 each, Rfl: 2 .  losartan (COZAAR) 25 MG tablet, TAKE 1 TABLET IN THE P.M. (Patient taking differently: Take 25 mg by mouth every evening. ), Disp: 90 tablet, Rfl: 3 .  meclizine (ANTIVERT) 25 MG tablet, Take 25 mg by mouth 3 (three) times daily., Disp: , Rfl:  .  MEDROL 4 MG TBPK tablet, Take by mouth as directed., Disp: , Rfl:  .  metFORMIN (GLUCOPHAGE) 500 MG tablet, TAKE 2 TABLETS TWICE DAILY WITH MEALS. (Patient taking differently: Take 1,000 mg by mouth 2 (two) times daily with a meal. ), Disp: 360  tablet, Rfl: 3 .  Omega-3 Fatty Acids (OMEGA 3 PO), Take 1 capsule by mouth 2 (two) times daily., Disp: , Rfl:  .  pregabalin (LYRICA) 75 MG capsule, Take 75 mg by mouth 2 (two) times daily., Disp: , Rfl:  .  traMADol (ULTRAM) 50 MG tablet, Take 1 tablet (50 mg total) by mouth every 6 (six) hours as needed for moderate pain., Disp: 20 tablet, Rfl: 0 .  traZODone (DESYREL) 50 MG tablet, Take 50 mg by mouth at bedtime., Disp: , Rfl:   Allergies  Allergen Reactions  . Codeine Itching        Objective:  Physical Exam  General: AAO x3, NAD  Dermatological: Skin is warm, dry and supple bilateral.  No open lesions identified today.  There are no open sores, no preulcerative lesions, no rash or signs of infection present.  Vascular: Dorsalis Pedis artery and Posterior Tibial artery pedal pulses are 2/4 bilateral with immedate capillary fill time. There is no pain with calf compression, swelling, warmth, erythema.   Neruologic: Grossly intact via light touch bilateral.  Sensation appears to be intact with Semmes Weinstein monofilament.  Musculoskeletal: There is edema noted to the right foot without any associated erythema or warmth.  There is no area of pinpoint tenderness.  Flexor, extensor tendons appear to be intact.  Decreased medial arch upon weightbearing.  Muscular strength 5/5 in all groups tested bilateral.  Gait: Unassisted, Nonantalgic.       Assessment:   65 year old female with bilateral foot pain, right side swelling; concern for neuropathy     Plan:  -Treatment options discussed including all alternatives, risks, and complications -Etiology of symptoms were discussed -X-rays were obtained and reviewed with the patient.  There is no evidence of acute fracture or stress fracture identified today. -Unna boot was applied and precautions were advised on monitoring movements -Continue Lyrica  Return in about 3 weeks (around 08/01/2020).  Trula Slade DPM

## 2020-08-01 ENCOUNTER — Ambulatory Visit: Payer: Medicare Other | Admitting: Podiatry

## 2020-08-22 ENCOUNTER — Telehealth: Payer: Self-pay

## 2020-08-22 NOTE — Telephone Encounter (Signed)
Pt's spouse called to schedule pt for a colonoscopy but the pt had one done with Dr Benson Norway 3-4 years ago, will attempt to fax over report to review.

## 2020-08-24 ENCOUNTER — Ambulatory Visit (INDEPENDENT_AMBULATORY_CARE_PROVIDER_SITE_OTHER): Payer: Medicare Other | Admitting: Vascular Surgery

## 2020-08-24 ENCOUNTER — Ambulatory Visit (HOSPITAL_COMMUNITY)
Admission: RE | Admit: 2020-08-24 | Discharge: 2020-08-24 | Disposition: A | Discharge status: Home / Self Care | Payer: Medicare Other | Source: Ambulatory Visit | Attending: Vascular Surgery | Admitting: Vascular Surgery

## 2020-08-24 ENCOUNTER — Other Ambulatory Visit: Payer: Self-pay

## 2020-08-24 ENCOUNTER — Encounter: Payer: Self-pay | Admitting: Vascular Surgery

## 2020-08-24 VITALS — BP 145/58 | HR 71 | Temp 97.9°F | Resp 18 | Ht 58.5 in | Wt 188.1 lb

## 2020-08-24 DIAGNOSIS — I771 Stricture of artery: Secondary | ICD-10-CM | POA: Diagnosis not present

## 2020-08-24 DIAGNOSIS — Z48812 Encounter for surgical aftercare following surgery on the circulatory system: Secondary | ICD-10-CM

## 2020-08-24 DIAGNOSIS — I7409 Other arterial embolism and thrombosis of abdominal aorta: Secondary | ICD-10-CM | POA: Diagnosis not present

## 2020-08-24 DIAGNOSIS — I739 Peripheral vascular disease, unspecified: Secondary | ICD-10-CM | POA: Insufficient documentation

## 2020-08-24 NOTE — Progress Notes (Signed)
REASON FOR VISIT:   Follow-up of peripheral vascular disease.  MEDICAL ISSUES:   STATUS POST AORTOILIAC BYPASS: This patient underwent an aorto iliac bypass in 2000.  She developed a stenosis in the proximal right limb of her graft and underwent angioplasty and stenting of this with a covered stent on 12/03/2016.  This was done with a VBX covered stent.  Based on my exam and her ABIs her bypass graft is patent and she is asymptomatic from that standpoint.  The weakness she gets in her legs after sitting does not sound like it is related to peripheral vascular disease.  She gets no weakness or symptoms when she is walking its only after sitting.  This would suggest that this might be neurogenic in origin although she has no back pain.  I have ordered follow-up ABIs in 1 year and I will see her back at that time.  I encouraged her to stay as active as possible.  STATUS POST AORTO INNOMINATE BYPASS: This patient had presented with paresthesias in her right arm and right arm weakness.  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  On 05/18/2019, she underwent an aorto innominate bypass with 8 mm Dacron graft by Dr. Cyndia Bent and myself.  She has a palpable right radial pulse and her bypass graft is patent.  I have ordered a carotid duplex scan in 1 year when she returns for her next visit.  Her last duplex showed no significant carotid disease.  She is on aspirin and is on a statin.   HPI:   Diane Giles is a pleasant 65 y.o. female who had undergone an aortobiiliac bypass graft in 2000.  She had developed a stenosis in the proximal right limb of her graft and underwent angioplasty and stenting of this with a VBX covered stent in July 2018.  More recently she had presented with paresthesias in her right arm and right arm weakness.  CT scan confirmed that innominate artery stenosis with significant  calcific disease in her arch making her a poor candidate for endovascular approach.  In January of last year she underwent an aorto innominate bypass with an 8 mm Dacron graft by Dr. Cyndia Bent and myself.  When I saw her last CTs angiogram showed that her bypass graft was patent without any areas of stenosis.  She had no significant carotid disease.  I plan on seeing her back in 1 year with follow-up ABIs.  She had quit smoking and was on aspirin and a statin.  On my history, the patient denies any significant claudication.  She denies rest pain or nonhealing ulcers.  She said no right upper extremity symptoms.  She has had some persistent dizziness for over 2 years now.  She is scheduled to see a vestibular therapist.  She also describes that her legs feel weak after she has been sitting.  This does not occur when she is walking.  She quit smoking in 2018.  Her risk factors for peripheral vascular disease include type 2 diabetes, hypertension, hypercholesterolemia, and a history of smoking.  She is on aspirin and is on a statin.  Past Medical History:  Diagnosis Date  . Aortic stenosis    mild-moderate AS 04/22/19 echo  . Aortoiliac occlusive disease ([place]) 12/03/2016  . Arthritis   . Diabetes mellitus without complication ([place])    per pt she is pre- diabetic  . Diverticulitis   .  Heart murmur   . Hypertension   . Osteoporosis   . Sleep apnea   . Tobacco use disorder 07/04/2012    Family History  Problem Relation Age of Onset  . Cancer Father 33       Dec age 30 with pancreatic Ca  . Migraines Mother   . Thyroid disease Mother        hypothyroid    SOCIAL HISTORY: Social History   Tobacco Use  . Smoking status: Former Smoker    Years: 40.00    Types: Cigarettes    Quit date: 02/23/2017    Years since quitting: 3.5  . Smokeless tobacco: Never Used  Substance Use Topics  . Alcohol use: Yes    Alcohol/week: 0.0 standard drinks    Comment: very rare--1/month    Allergies   Allergen Reactions  . Codeine Itching    Current Outpatient Medications  Medication Sig Dispense Refill  . aspirin 81 MG chewable tablet Chew 81 mg by mouth daily.    Marland Kitchen atenolol (TENORMIN) 50 MG tablet Take 50 mg by mouth every morning.    Marland Kitchen atenolol-chlorthalidone (TENORETIC) 50-25 MG tablet TAKE 1 TABLET IN THE MORNING. (Patient taking differently: Take 1 tablet by mouth daily.) 30 tablet 0  . atorvastatin (LIPITOR) 80 MG tablet Take 1 tablet (80 mg total) by mouth daily at 6 PM. 90 tablet 3  . clobetasol cream (TEMOVATE) 0.05 %     . furosemide (LASIX) 20 MG tablet Take 1 tablet (20 mg total) by mouth every morning. 90 tablet 1  . hydrOXYzine (ATARAX/VISTARIL) 25 MG tablet Take 25 mg by mouth 2 (two) times daily.    . Lancets MISC Use as directed once a day. (DX: R73.9) 100 each 2  . meclizine (ANTIVERT) 25 MG tablet Take 25 mg by mouth 3 (three) times daily.    . metFORMIN (GLUCOPHAGE) 500 MG tablet TAKE 2 TABLETS TWICE DAILY WITH MEALS. (Patient taking differently: Take 1,000 mg by mouth 2 (two) times daily with a meal.) 360 tablet 3  . Omega-3 Fatty Acids (OMEGA 3 PO) Take 1 capsule by mouth 2 (two) times daily.    . pregabalin (LYRICA) 75 MG capsule Take 75 mg by mouth 2 (two) times daily.    . traZODone (DESYREL) 50 MG tablet Take 50 mg by mouth at bedtime.    Marland Kitchen losartan (COZAAR) 25 MG tablet TAKE 1 TABLET IN THE P.M. (Patient not taking: Reported on 08/24/2020) 90 tablet 3  . MEDROL 4 MG TBPK tablet Take by mouth as directed.    . traMADol (ULTRAM) 50 MG tablet Take 1 tablet (50 mg total) by mouth every 6 (six) hours as needed for moderate pain. (Patient not taking: Reported on 08/24/2020) 20 tablet 0   No current facility-administered medications for this visit.    REVIEW OF SYSTEMS:  [X]  denotes positive finding, [ ]  denotes negative finding Cardiac  Comments:  Chest pain or chest pressure:    Shortness of breath upon exertion:    Short of breath when lying flat:     Irregular heart rhythm:        Vascular    Pain in calf, thigh, or hip brought on by ambulation:    Pain in feet at night that wakes you up from your sleep:     Blood clot in your veins:    Leg swelling:  x       Pulmonary    Oxygen at home:    Productive cough:  Wheezing:         Neurologic    Sudden weakness in arms or legs:     Sudden numbness in arms or legs:     Sudden onset of difficulty speaking or slurred speech:    Temporary loss of vision in one eye:     Problems with dizziness:  x       Gastrointestinal    Blood in stool:     Vomited blood:         Genitourinary    Burning when urinating:     Blood in urine:        Psychiatric    Major depression:         Hematologic    Bleeding problems:    Problems with blood clotting too easily:        Skin    Rashes or ulcers:        Constitutional    Fever or chills:     PHYSICAL EXAM:   Vitals:   08/24/20 1546  BP: (!) 145/58  Pulse: 71  Resp: 18  Temp: 97.9 F (36.6 C)  TempSrc: Temporal  SpO2: 97%  Weight: 188 lb 1.6 oz (85.3 kg)  Height: 4' 10.5" (1.486 m)    GENERAL: The patient is a well-nourished female, in no acute distress. The vital signs are documented above. CARDIAC: There is a regular rate and rhythm.  VASCULAR: I do not detect carotid bruits. She has palpable femoral pulses. I cannot palpate pedal pulses. She has mild bilateral lower extremity swelling. PULMONARY: There is good air exchange bilaterally without wheezing or rales. ABDOMEN: Soft and non-tender with normal pitched bowel sounds.  MUSCULOSKELETAL: There are no major deformities or cyanosis. NEUROLOGIC: No focal weakness or paresthesias are detected. SKIN: There are no ulcers or rashes noted. PSYCHIATRIC: The patient has a normal affect.  DATA:    ARTERIAL DOPPLER STUDY: I have independently interpreted her arterial Doppler study today.  On the right side she has a biphasic anterior tibial signal with a triphasic  posterior tibial signal ABI is 100%.  Toe pressure 77 mmHg.  On the left side she has a biphasic anterior tibial signal with a triphasic posterior tibial signal.  ABIs 95%.  Toe pressure is 96 mmHg.  Diane Giles [facility] [phone]

## 2020-08-31 ENCOUNTER — Ambulatory Visit
Admission: RE | Admit: 2020-08-31 | Discharge: 2020-08-31 | Disposition: A | Discharge status: Home / Self Care | Payer: Medicare Other | Source: Ambulatory Visit | Attending: Acute Care | Admitting: Acute Care

## 2020-08-31 ENCOUNTER — Other Ambulatory Visit: Payer: Self-pay

## 2020-08-31 DIAGNOSIS — Z87891 Personal history of nicotine dependence: Secondary | ICD-10-CM

## 2020-09-10 NOTE — Progress Notes (Signed)
Please call patient and let them  know their  low dose Ct was read as a Lung RADS 2: nodules that are benign in appearance and behavior with a very low likelihood of becoming a clinically active cancer due to size or lack of growth. Recommendation per radiology is for a repeat LDCT in 12 months. .Please let them  know we will order and schedule their  annual screening scan for 08/2021.  Please let them  know there was notation of CAD on their  scan.  Please remind the patient  that this is a non-gated exam therefore degree or severity of disease  cannot be determined. Please have them  follow up with their PCP regarding potential risk factor modification, dietary therapy or pharmacologic therapy if clinically indicated. Pt.  is  currently on statin therapy. Please place order for annual  screening scan for  08/2021 and fax results to PCP. Thanks so much.  Please have patient follow up with PCP regarding Aortic atherosclerosis and coronary artery calcifications.They are currently on statins

## 2020-09-13 ENCOUNTER — Other Ambulatory Visit: Payer: Self-pay | Admitting: *Deleted

## 2020-09-13 DIAGNOSIS — Z87891 Personal history of nicotine dependence: Secondary | ICD-10-CM

## 2020-10-05 ENCOUNTER — Other Ambulatory Visit: Payer: Self-pay

## 2020-10-05 DIAGNOSIS — I771 Stricture of artery: Secondary | ICD-10-CM

## 2020-12-27 ENCOUNTER — Other Ambulatory Visit: Payer: Self-pay

## 2020-12-27 ENCOUNTER — Encounter (HOSPITAL_BASED_OUTPATIENT_CLINIC_OR_DEPARTMENT_OTHER): Payer: Self-pay

## 2020-12-27 ENCOUNTER — Emergency Department (HOSPITAL_BASED_OUTPATIENT_CLINIC_OR_DEPARTMENT_OTHER): Payer: Medicare Other

## 2020-12-27 ENCOUNTER — Observation Stay (HOSPITAL_BASED_OUTPATIENT_CLINIC_OR_DEPARTMENT_OTHER)
Admission: EM | Admit: 2020-12-27 | Discharge: 2020-12-28 | Disposition: A | Discharge status: Home / Self Care | Payer: Medicare Other | Attending: Family Medicine | Admitting: Family Medicine

## 2020-12-27 DIAGNOSIS — E119 Type 2 diabetes mellitus without complications: Secondary | ICD-10-CM | POA: Diagnosis not present

## 2020-12-27 DIAGNOSIS — I10 Essential (primary) hypertension: Secondary | ICD-10-CM | POA: Insufficient documentation

## 2020-12-27 DIAGNOSIS — R079 Chest pain, unspecified: Principal | ICD-10-CM | POA: Diagnosis present

## 2020-12-27 DIAGNOSIS — Z7984 Long term (current) use of oral hypoglycemic drugs: Secondary | ICD-10-CM | POA: Diagnosis not present

## 2020-12-27 DIAGNOSIS — Z79899 Other long term (current) drug therapy: Secondary | ICD-10-CM | POA: Insufficient documentation

## 2020-12-27 DIAGNOSIS — Z87891 Personal history of nicotine dependence: Secondary | ICD-10-CM | POA: Diagnosis not present

## 2020-12-27 DIAGNOSIS — Z96641 Presence of right artificial hip joint: Secondary | ICD-10-CM | POA: Insufficient documentation

## 2020-12-27 DIAGNOSIS — Z20822 Contact with and (suspected) exposure to covid-19: Secondary | ICD-10-CM | POA: Insufficient documentation

## 2020-12-27 DIAGNOSIS — Z7982 Long term (current) use of aspirin: Secondary | ICD-10-CM | POA: Diagnosis not present

## 2020-12-27 DIAGNOSIS — I739 Peripheral vascular disease, unspecified: Secondary | ICD-10-CM

## 2020-12-27 DIAGNOSIS — G4733 Obstructive sleep apnea (adult) (pediatric): Secondary | ICD-10-CM | POA: Diagnosis present

## 2020-12-27 LAB — TROPONIN I (HIGH SENSITIVITY)
Troponin I (High Sensitivity): 3 ng/L (ref <18)
Troponin I (High Sensitivity): 3 ng/L (ref <18)

## 2020-12-27 LAB — CBC
HCT: 32.8 % — ABNORMAL LOW (ref 36.0–46.0)
Hemoglobin: 10.7 g/dL — ABNORMAL LOW (ref 12.0–15.0)
MCH: 29.4 pg (ref 26.0–34.0)
MCHC: 32.6 g/dL (ref 30.0–36.0)
MCV: 90.1 fL (ref 80.0–100.0)
Platelets: 312 10*3/uL (ref 150–400)
RBC: 3.64 MIL/uL — ABNORMAL LOW (ref 3.87–5.11)
RDW: 14 % (ref 11.5–15.5)
WBC: 11.6 10*3/uL — ABNORMAL HIGH (ref 4.0–10.5)
nRBC: 0 % (ref 0.0–0.2)

## 2020-12-27 LAB — BASIC METABOLIC PANEL
Anion gap: 10 (ref 5–15)
BUN: 12 mg/dL (ref 8–23)
CO2: 32 mmol/L (ref 22–32)
Calcium: 7.9 mg/dL — ABNORMAL LOW (ref 8.9–10.3)
Chloride: 99 mmol/L (ref 98–111)
Creatinine, Ser: 0.78 mg/dL (ref 0.44–1.00)
GFR, Estimated: >60 mL/min (ref >60)
Glucose, Bld: 136 mg/dL — ABNORMAL HIGH (ref 70–99)
Potassium: 3.3 mmol/L — ABNORMAL LOW (ref 3.5–5.1)
Sodium: 141 mmol/L (ref 135–145)

## 2020-12-27 LAB — RESP PANEL BY RT-PCR (FLU A&B, COVID) ARPGX2
Influenza A by PCR: NEGATIVE
Influenza B by PCR: NEGATIVE
SARS Coronavirus 2 by RT PCR: NEGATIVE

## 2020-12-27 LAB — MAGNESIUM: Magnesium: 1.2 mg/dL — ABNORMAL LOW (ref 1.7–2.4)

## 2020-12-27 MED ORDER — POTASSIUM CHLORIDE CRYS ER 20 MEQ PO TBCR
40.0000 meq | EXTENDED_RELEASE_TABLET | Freq: Once | ORAL | Status: AC
  Administered 2020-12-27: 40 meq via ORAL
  Filled 2020-12-27: qty 2

## 2020-12-27 NOTE — ED Notes (Signed)
Pt ambulated to bathroom with minimal assistance.

## 2020-12-27 NOTE — ED Triage Notes (Signed)
Patient here POV from Home with CP.  Patient states shortly after waking up, the Patient began to experience CP in the Mid-Chest. Pain is Pressure-Like and Non-Radiating.   Ambulatory. GCS 15. Mild Nausea in AM. TUMS not effective at alleviating Pain. Moderate SOB.

## 2020-12-27 NOTE — ED Provider Notes (Signed)
[facility] DEPT Provider Note   CSN: [id number] Arrival date & time: 12/27/20  1941     History Chief Complaint  Patient presents with   Chest Pain    Diane Giles is a 65 y.o. female.  Patient with history of presents peripheral vascular disease, aorto innominate artery bypass, diabetes atherosclerosis, presents with chief complaint of chest pain.  She states it started this morning describes it as mid chest pressure-like nonradiating.  Denies associated fevers or shortness of breath denies diaphoresis.  Denies cough or vomiting or diarrhea.      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   Aortoiliac occlusive disease ([place]) 12/03/2016   Centrilobular emphysema ([place]) 10/03/2016   Right groin pain 02/27/2016   Sacroiliitis ([place]) 02/27/2016   Postmenopausal bleeding 12/01/2015   Psoas tendinitis of right side 10/21/2015   Endometrial thickening on ultra sound 07/28/2015   S/P right THA, 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   Type 2 diabetes mellitus ([place]) 05/18/2014   DDD (degenerative disc disease) [id number]   Metabolic syndrome 0000000   Essential hypertension, benign 07/18/2012   Hyperlipidemia 07/18/2012   Obstructive sleep apnea 07/04/2012   Osteoporosis 07/04/2012   Morbid obesity ([place]) 07/04/2012    Past Surgical History:  Procedure Laterality Date   ABDOMINAL AORTOGRAM W/LOWER EXTREMITY N/A 12/03/2016   Procedure: Abdominal Aortogram w/Lower Extremity;  Surgeon: Angelia Mould, MD;  Location: [facility];  Service:  Cardiovascular;  Laterality: N/A;   AORTA -INNOMIATE BYPASS N/A 05/18/2019   Procedure: AORTA -INNOMIATE BYPASS Using Hemashield Gold Graft Size 20m;  Surgeon: BGaye Pollack MD;  Location: [facility];  Service: Open Heart Surgery;  Laterality: N/A;   DILATATION & CURETTAGE/HYSTEROSCOPY WITH MYOSURE N/A 12/27/2015   Procedure: DILATATION & CURETTAGE/HYSTEROSCOPY;  Surgeon: BNunzio Cobbs MD;  [facility];  Service: Gynecology;  Laterality: N/A;   DILATION AND CURETTAGE OF UTERUS  11/2015   ELBOW SURGERY Left ~2007   tendon repair by Dr. NVeverly Fells  EYE SURGERY Bilateral 2008   lasik   JOINT REPLACEMENT     lower aortic bypass  2000   per patient   PERIPHERAL VASCULAR INTERVENTION  12/03/2016   Procedure: Peripheral Vascular Intervention;  Surgeon: DAngelia Mould MD;  [facility];  Service: Cardiovascular;;   STERNOTOMY  05/18/2019   Procedure: Sternotomy;  Surgeon: BGaye Pollack MD;  [facility];  Service: Open Heart Surgery;;   TOTAL HIP ARTHROPLASTY Right 07/27/2014   Procedure: RIGHT TOTAL HIP ARTHROPLASTY ANTERIOR APPROACH;  Surgeon: MParalee Cancel MD;  Location: [facility];  Service: Orthopedics;  Laterality: Right;     OB History     Gravida  5   Para  5   Term  5   Preterm      AB      Living  5      SAB      IAB      Ectopic  Multiple      Live Births              Family History  Problem Relation Age of Onset   Cancer Father 62       Dec age 17 with pancreatic Ca   Migraines Mother    Thyroid disease Mother        hypothyroid    Social History   Tobacco Use   Smoking status: Former    Years: 40.00    Types: Cigarettes    Quit date: 02/23/2017    Years since quitting: 3.8   Smokeless tobacco: Never  Vaping Use   Vaping Use: Never used  Substance Use Topics   Alcohol use: Yes    Alcohol/week: 0.0 standard drinks    Comment: very rare--1/month   Drug use: No    Home Medications Prior to Admission  medications   Medication Sig Start Date End Date Taking? Authorizing Provider  aspirin 81 MG chewable tablet Chew 81 mg by mouth daily.    [provider]  atenolol (TENORMIN) 50 MG tablet Take 50 mg by mouth every morning. 06/10/20   [provider]  atenolol-chlorthalidone (TENORETIC) 50-25 MG tablet TAKE 1 TABLET IN THE MORNING. Patient taking differently: Take 1 tablet by mouth daily. 08/15/18   Shawnee Knapp, MD  atorvastatin (LIPITOR) 80 MG tablet Take 1 tablet (80 mg total) by mouth daily at 6 PM. 08/16/17   Shawnee Knapp, MD  clobetasol cream (TEMOVATE) 0.05 %  10/13/19   [provider]  furosemide (LASIX) 20 MG tablet Take 1 tablet (20 mg total) by mouth every morning. 03/21/18   Shawnee Knapp, MD  hydrOXYzine (ATARAX/VISTARIL) 25 MG tablet Take 25 mg by mouth 2 (two) times daily. 03/09/19   [provider]  Lancets MISC Use as directed once a day. (DX: R73.9) 01/20/15   Shawnee Knapp, MD  losartan (COZAAR) 25 MG tablet TAKE 1 TABLET IN THE P.M. Patient not taking: Reported on 08/24/2020 08/16/17   Shawnee Knapp, MD  meclizine (ANTIVERT) 25 MG tablet Take 25 mg by mouth 3 (three) times daily. 10/27/19   [provider]  MEDROL 4 MG TBPK tablet Take by mouth as directed. 06/30/20   [provider]  metFORMIN (GLUCOPHAGE) 500 MG tablet TAKE 2 TABLETS TWICE DAILY WITH MEALS. Patient taking differently: Take 1,000 mg by mouth 2 (two) times daily with a meal. 08/16/17   Shawnee Knapp, MD  Omega-3 Fatty Acids (OMEGA 3 PO) Take 1 capsule by mouth 2 (two) times daily.    [provider]  pregabalin (LYRICA) 75 MG capsule Take 75 mg by mouth 2 (two) times daily. 01/01/20   [provider]  traMADol (ULTRAM) 50 MG tablet Take 1 tablet (50 mg total) by mouth every 6 (six) hours as needed for moderate pain. Patient not taking: Reported on 08/24/2020 05/29/19   Gaye Pollack, MD  traZODone (DESYREL) 50 MG tablet Take 50 mg by mouth at bedtime. 12/13/19    [provider]    Allergies    Codeine  Review of Systems   Review of Systems  Constitutional:  Negative for fever.  HENT:  Negative for ear pain.   Eyes:  Negative for pain.  Respiratory:  Negative for cough.   Cardiovascular:  Positive for chest pain.  Gastrointestinal:  Negative for abdominal pain.  Genitourinary:  Negative for flank pain.  Musculoskeletal:  Negative for back pain.  Skin:  Negative for rash.  Neurological:  Negative for headaches.   Physical Exam Updated Vital Signs BP (!) 124/43   Pulse 72   Temp 99.1 F (37.3 C) (Oral)   Resp 16   Ht '4\' 11"'$  (1.499 m)   Wt 83.9 kg   LMP 05/07/2005 (Approximate)   SpO2 96%   BMI 37.37 kg/m   Physical Exam Constitutional:      General: She is not in acute distress.    Appearance: Normal appearance.  HENT:     Head: Normocephalic.     Nose: Nose normal.  Eyes:     Extraocular Movements: Extraocular movements intact.  Cardiovascular:     Rate and Rhythm: Normal rate.  Pulmonary:     Effort: Pulmonary effort is normal.  Musculoskeletal:        General: Normal range of motion.     Cervical back: Normal range of motion.  Neurological:     General: No focal deficit present.     Mental Status: She is alert. Mental status is at baseline.    ED Results / Procedures / Treatments   Labs (all labs ordered are listed, but only abnormal results are displayed) Labs Reviewed  BASIC METABOLIC PANEL - Abnormal; Notable for the following components:      Result Value   Potassium 3.3 (*)    Glucose, Bld 136 (*)    Calcium 7.9 (*)    All other components within normal limits  CBC - Abnormal; Notable for the following components:   WBC 11.6 (*)    RBC 3.64 (*)    Hemoglobin 10.7 (*)    HCT 32.8 (*)    All other components within normal limits  RESP PANEL BY RT-PCR (FLU A&B, COVID) ARPGX2  TROPONIN I (HIGH SENSITIVITY)  TROPONIN I (HIGH SENSITIVITY)    EKG EKG Interpretation  Date/Time:  Tuesday  December 27 2020 19:48:26 EDT Ventricular Rate:  77 PR Interval:  135 QRS Duration: 92 QT Interval:  383 QTC Calculation: 434 R Axis:   57 Text Interpretation: Sinus rhythm Confirmed by Thamas Jaegers ([id number]) on 12/27/2020 8:45:26 PM  Radiology DG Chest Portable 1 View  Result Date: 12/27/2020 CLINICAL DATA:  Chest pain, shortness of breath EXAM: PORTABLE CHEST 1 VIEW COMPARISON:  06/17/2019 FINDINGS: Prior median sternotomy. Aortic atherosclerosis. Heart and mediastinal contours are within normal limits. No focal opacities or effusions. No acute bony abnormality. IMPRESSION: No active cardiopulmonary disease. Electronically Signed   By: Rolm Baptise M.D.   On: 12/27/2020 20:21    Procedures Procedures   Medications Ordered in ED Medications  potassium chloride SA (KLOR-CON) CR tablet 40 mEq (40 mEq Oral Given 12/27/20 2120)    ED Course  I have reviewed the triage vital signs and the nursing notes.  Pertinent labs & imaging results that were available during my care of the patient were reviewed by me and considered in my medical decision making (see chart for details).    MDM Rules/Calculators/A&P                           EKG sinus rhythm no ST elevations noted depressions noted no T wave inversions noted.  Labs unremarkable potassium slightly low and repleted here in the ER troponin is normal.  Second troponin sent and pending.   Patient discussed with Dr. Alfred Levins from cardiology.  Agree with plan to observe in the hospital get a repeat cardiac echo.  Recommending hospitalist admission, whom  I have paged.   Final Clinical Impression(s) / ED Diagnoses Final diagnoses:  Chest pain, unspecified type    Rx / DC Orders ED Discharge Orders     None        Luna Fuse, MD 12/27/20 2307

## 2020-12-28 ENCOUNTER — Encounter (HOSPITAL_COMMUNITY): Payer: Self-pay | Admitting: Internal Medicine

## 2020-12-28 ENCOUNTER — Observation Stay (HOSPITAL_BASED_OUTPATIENT_CLINIC_OR_DEPARTMENT_OTHER): Payer: Medicare Other

## 2020-12-28 DIAGNOSIS — Z20822 Contact with and (suspected) exposure to covid-19: Secondary | ICD-10-CM | POA: Diagnosis not present

## 2020-12-28 DIAGNOSIS — I739 Peripheral vascular disease, unspecified: Secondary | ICD-10-CM

## 2020-12-28 DIAGNOSIS — Z96641 Presence of right artificial hip joint: Secondary | ICD-10-CM | POA: Diagnosis not present

## 2020-12-28 DIAGNOSIS — I10 Essential (primary) hypertension: Secondary | ICD-10-CM | POA: Diagnosis not present

## 2020-12-28 DIAGNOSIS — E1165 Type 2 diabetes mellitus with hyperglycemia: Secondary | ICD-10-CM | POA: Diagnosis not present

## 2020-12-28 DIAGNOSIS — E119 Type 2 diabetes mellitus without complications: Secondary | ICD-10-CM | POA: Diagnosis not present

## 2020-12-28 DIAGNOSIS — Z79899 Other long term (current) drug therapy: Secondary | ICD-10-CM | POA: Diagnosis not present

## 2020-12-28 DIAGNOSIS — R079 Chest pain, unspecified: Secondary | ICD-10-CM

## 2020-12-28 DIAGNOSIS — Z7982 Long term (current) use of aspirin: Secondary | ICD-10-CM | POA: Diagnosis not present

## 2020-12-28 DIAGNOSIS — Z7984 Long term (current) use of oral hypoglycemic drugs: Secondary | ICD-10-CM | POA: Diagnosis not present

## 2020-12-28 DIAGNOSIS — Z87891 Personal history of nicotine dependence: Secondary | ICD-10-CM | POA: Diagnosis not present

## 2020-12-28 LAB — CBC WITH DIFFERENTIAL/PLATELET
Abs Immature Granulocytes: 0.03 10*3/uL (ref 0.00–0.07)
Basophils Absolute: 0.1 10*3/uL (ref 0.0–0.1)
Basophils Relative: 1 %
Eosinophils Absolute: 0.1 10*3/uL (ref 0.0–0.5)
Eosinophils Relative: 1 %
HCT: 33.3 % — ABNORMAL LOW (ref 36.0–46.0)
Hemoglobin: 10.7 g/dL — ABNORMAL LOW (ref 12.0–15.0)
Immature Granulocytes: 0 %
Lymphocytes Relative: 23 %
Lymphs Abs: 2.2 10*3/uL (ref 0.7–4.0)
MCH: 29.5 pg (ref 26.0–34.0)
MCHC: 32.1 g/dL (ref 30.0–36.0)
MCV: 91.7 fL (ref 80.0–100.0)
Monocytes Absolute: 0.6 10*3/uL (ref 0.1–1.0)
Monocytes Relative: 6 %
Neutro Abs: 6.5 10*3/uL (ref 1.7–7.7)
Neutrophils Relative %: 69 %
Platelets: 312 10*3/uL (ref 150–400)
RBC: 3.63 MIL/uL — ABNORMAL LOW (ref 3.87–5.11)
RDW: 14 % (ref 11.5–15.5)
WBC: 9.5 10*3/uL (ref 4.0–10.5)
nRBC: 0 % (ref 0.0–0.2)

## 2020-12-28 LAB — LIPID PANEL
Cholesterol: 154 mg/dL (ref 0–200)
HDL: 42 mg/dL (ref >40)
LDL Cholesterol: 92 mg/dL (ref 0–99)
Total CHOL/HDL Ratio: 3.7 RATIO
Triglycerides: 102 mg/dL (ref <150)
VLDL: 20 mg/dL (ref 0–40)

## 2020-12-28 LAB — ECHOCARDIOGRAM COMPLETE
AR max vel: 1.38 cm2
AV Area VTI: 1.5 cm2
AV Area mean vel: 1.41 cm2
AV Mean grad: 11.8 mmHg
AV Peak grad: 20.6 mmHg
Ao pk vel: 2.27 m/s
Area-P 1/2: 3.3 cm2
Calc EF: 76.7 %
Height: 58 in
MV VTI: 2.99 cm2
S' Lateral: 2.1 cm
Single Plane A2C EF: 80.3 %
Single Plane A4C EF: 69.6 %
Weight: 3058.22 oz

## 2020-12-28 LAB — COMPREHENSIVE METABOLIC PANEL
ALT: 17 U/L (ref 0–44)
AST: 18 U/L (ref 15–41)
Albumin: 3 g/dL — ABNORMAL LOW (ref 3.5–5.0)
Alkaline Phosphatase: 117 U/L (ref 38–126)
Anion gap: 9 (ref 5–15)
BUN: 10 mg/dL (ref 8–23)
CO2: 26 mmol/L (ref 22–32)
Calcium: 8.8 mg/dL — ABNORMAL LOW (ref 8.9–10.3)
Chloride: 103 mmol/L (ref 98–111)
Creatinine, Ser: 0.87 mg/dL (ref 0.44–1.00)
GFR, Estimated: >60 mL/min (ref >60)
Glucose, Bld: 121 mg/dL — ABNORMAL HIGH (ref 70–99)
Potassium: 4 mmol/L (ref 3.5–5.1)
Sodium: 138 mmol/L (ref 135–145)
Total Bilirubin: 0.6 mg/dL (ref 0.3–1.2)
Total Protein: 6.3 g/dL — ABNORMAL LOW (ref 6.5–8.1)

## 2020-12-28 LAB — TROPONIN I (HIGH SENSITIVITY)
Troponin I (High Sensitivity): 7 ng/L (ref <18)
Troponin I (High Sensitivity): 4 ng/L (ref <18)

## 2020-12-28 LAB — GLUCOSE, CAPILLARY
Glucose-Capillary: 126 mg/dL — ABNORMAL HIGH (ref 70–99)
Glucose-Capillary: 124 mg/dL — ABNORMAL HIGH (ref 70–99)
Glucose-Capillary: 131 mg/dL — ABNORMAL HIGH (ref 70–99)
Glucose-Capillary: 108 mg/dL — ABNORMAL HIGH (ref 70–99)

## 2020-12-28 LAB — HIV ANTIBODY (ROUTINE TESTING W REFLEX): HIV Screen 4th Generation wRfx: NONREACTIVE

## 2020-12-28 LAB — HEMOGLOBIN A1C
Hgb A1c MFr Bld: 6.3 % — ABNORMAL HIGH (ref 4.8–5.6)
Mean Plasma Glucose: 134.11 mg/dL

## 2020-12-28 MED ORDER — MAGNESIUM SULFATE 2 GM/50ML IV SOLN
2.0000 g | Freq: Once | INTRAVENOUS | Status: DC
  Filled 2020-12-28: qty 50

## 2020-12-28 MED ORDER — ATORVASTATIN CALCIUM 80 MG PO TABS: 80.0000 mg | ORAL_TABLET | Freq: Every day | ORAL | Status: DC

## 2020-12-28 MED ORDER — INSULIN ASPART 100 UNIT/ML IJ SOLN: 0.0000 [IU] | INTRAMUSCULAR | Status: DC

## 2020-12-28 MED ORDER — HYDRALAZINE HCL 20 MG/ML IJ SOLN: 10.0000 mg | INTRAMUSCULAR | Status: DC | PRN

## 2020-12-28 MED ORDER — MORPHINE SULFATE (PF) 2 MG/ML IV SOLN: 1.0000 mg | INTRAVENOUS | Status: DC | PRN

## 2020-12-28 MED ORDER — TRAZODONE HCL 50 MG PO TABS: 50.0000 mg | ORAL_TABLET | Freq: Every evening | ORAL | Status: DC | PRN

## 2020-12-28 MED ORDER — ENOXAPARIN SODIUM 40 MG/0.4ML IJ SOSY
40.0000 mg | PREFILLED_SYRINGE | INTRAMUSCULAR | Status: DC
  Administered 2020-12-28: 40 mg via SUBCUTANEOUS
  Filled 2020-12-28: qty 0.4

## 2020-12-28 MED ORDER — MAGNESIUM SULFATE 2 GM/50ML IV SOLN
2.0000 g | Freq: Once | INTRAVENOUS | Status: AC
  Administered 2020-12-28: 2 g via INTRAVENOUS
  Filled 2020-12-28: qty 50

## 2020-12-28 MED ORDER — NITROGLYCERIN 0.4 MG SL SUBL: 0.4000 mg | SUBLINGUAL_TABLET | SUBLINGUAL | Status: DC | PRN

## 2020-12-28 MED ORDER — SODIUM CHLORIDE 0.9 % IV SOLN: INTRAVENOUS | Status: DC | PRN

## 2020-12-28 MED ORDER — ASPIRIN 81 MG PO CHEW
81.0000 mg | CHEWABLE_TABLET | Freq: Every day | ORAL | Status: DC
  Administered 2020-12-28: 81 mg via ORAL
  Filled 2020-12-28: qty 1

## 2020-12-28 MED ORDER — ATENOLOL 25 MG PO TABS
50.0000 mg | ORAL_TABLET | Freq: Every morning | ORAL | Status: DC
  Administered 2020-12-28: 50 mg via ORAL
  Filled 2020-12-28: qty 2

## 2020-12-28 NOTE — H&P (Signed)
History and Physical    Diane Giles VHQ:[id number] DOB: Aug 12, 1955 DOA: 12/27/2020  PCP: Margot Ables, MD  Patient coming from: Home.  Chief Complaint: Chest pain.  HPI: Diane Giles is a 65 y.o. female with history of aortoiliac bypass in 2000 following which patient had stenting in 2018 also aorto innominate artery surgery in 2021 presents to the ER with complaint of chest pain.  Patient started having chest pain waking up yesterday morning which was substernal pressure-like nonradiating with no associated shortness of breath productive cough or fever chills.  Pain is persistent even at rest.  Denies any abdominal pain had some nausea.  No vomiting.  ED Course: In the ER EKG was unremarkable cardiac markers are negative chest x-ray did not show anything acute.  COVID test negative.  Hemoglobin 10.7.  Patient admitted for further work-up chest pain.  Patient's magnesium and potassium was low which will be replaced.  Review of Systems: As per HPI, rest all negative.   Past Medical History:  Diagnosis Date   Aortic stenosis    mild-moderate AS 04/22/19 echo   Aortoiliac occlusive disease (HCC) 12/03/2016   Arthritis    Diabetes mellitus without complication (HCC)    per pt she is pre- diabetic   Diverticulitis    Heart murmur    Hypertension    Osteoporosis    Sleep apnea    Tobacco use disorder 07/04/2012    Past Surgical History:  Procedure Laterality Date   ABDOMINAL AORTOGRAM W/LOWER EXTREMITY N/A 12/03/2016   Procedure: Abdominal Aortogram w/Lower Extremity;  Surgeon: Chuck Hint, MD;  Location: [facility] INVASIVE CV LAB;  Service: Cardiovascular;  Laterality: N/A;   AORTA -INNOMIATE BYPASS N/A 05/18/2019   Procedure: AORTA -INNOMIATE BYPASS Using Hemashield Gold Graft Size 8mm;  Surgeon: Alleen Borne, MD;  Location: [facility] OR;  Service: Open Heart Surgery;  Laterality: N/A;   DILATATION & CURETTAGE/HYSTEROSCOPY WITH MYOSURE N/A 12/27/2015   Procedure:  DILATATION & CURETTAGE/HYSTEROSCOPY;  Surgeon: Patton Salles, MD;  Location: [facility] ORS;  Service: Gynecology;  Laterality: N/A;   DILATION AND CURETTAGE OF UTERUS  11/2015   ELBOW SURGERY Left ~2007   tendon repair by Dr. Ranell Patrick   EYE SURGERY Bilateral 2008   lasik   JOINT REPLACEMENT     lower aortic bypass  2000   per patient   PERIPHERAL VASCULAR INTERVENTION  12/03/2016   Procedure: Peripheral Vascular Intervention;  Surgeon: Chuck Hint, MD;  Location: [facility] INVASIVE CV LAB;  Service: Cardiovascular;;   STERNOTOMY  05/18/2019   Procedure: Sternotomy;  Surgeon: Alleen Borne, MD;  Location: [facility] OR;  Service: Open Heart Surgery;;   TOTAL HIP ARTHROPLASTY Right 07/27/2014   Procedure: RIGHT TOTAL HIP ARTHROPLASTY ANTERIOR APPROACH;  Surgeon: Durene Romans, MD;  Location: [facility] ORS;  Service: Orthopedics;  Laterality: Right;     reports that she quit smoking about 3 years ago. Her smoking use included cigarettes. She has never used smokeless tobacco. She reports current alcohol use. She reports that she does not use drugs.  Allergies  Allergen Reactions   Codeine Itching    Family History  Problem Relation Age of Onset   Cancer Father 43       Dec age 20 with pancreatic Ca   Migraines Mother    Thyroid disease Mother        hypothyroid    Prior to Admission medications   Medication Sig Start Date End Date Taking? Authorizing Provider  aspirin 81 MG chewable tablet Chew 81 mg by mouth daily.    [provider]  atenolol (TENORMIN) 50 MG tablet Take 50 mg by mouth every morning. 06/10/20   [provider]  atenolol-chlorthalidone (TENORETIC) 50-25 MG tablet TAKE 1 TABLET IN THE MORNING. Patient taking differently: Take 1 tablet by mouth daily. 08/15/18   Sherren Mocha, MD  atorvastatin (LIPITOR) 80 MG tablet Take 1 tablet (80 mg total) by mouth daily at 6 PM. 08/16/17   Sherren Mocha, MD  clobetasol cream (TEMOVATE) 0.05 %  10/13/19   [provider]   furosemide (LASIX) 20 MG tablet Take 1 tablet (20 mg total) by mouth every morning. 03/21/18   Sherren Mocha, MD  hydrOXYzine (ATARAX/VISTARIL) 25 MG tablet Take 25 mg by mouth 2 (two) times daily. 03/09/19   [provider]  Lancets MISC Use as directed once a day. (DX: R73.9) 01/20/15   Sherren Mocha, MD  losartan (COZAAR) 25 MG tablet TAKE 1 TABLET IN THE P.M. Patient not taking: Reported on 08/24/2020 08/16/17   Sherren Mocha, MD  meclizine (ANTIVERT) 25 MG tablet Take 25 mg by mouth 3 (three) times daily. 10/27/19   [provider]  MEDROL 4 MG TBPK tablet Take by mouth as directed. 06/30/20   [provider]  metFORMIN (GLUCOPHAGE) 500 MG tablet TAKE 2 TABLETS TWICE DAILY WITH MEALS. Patient taking differently: Take 1,000 mg by mouth 2 (two) times daily with a meal. 08/16/17   Sherren Mocha, MD  Omega-3 Fatty Acids (OMEGA 3 PO) Take 1 capsule by mouth 2 (two) times daily.    [provider]  pregabalin (LYRICA) 75 MG capsule Take 75 mg by mouth 2 (two) times daily. 01/01/20   [provider]  traMADol (ULTRAM) 50 MG tablet Take 1 tablet (50 mg total) by mouth every 6 (six) hours as needed for moderate pain. Patient not taking: Reported on 08/24/2020 05/29/19   Alleen Borne, MD  traZODone (DESYREL) 50 MG tablet Take 50 mg by mouth at bedtime. 12/13/19   [provider]    Physical Exam: Constitutional: Moderately built and nourished. Vitals:   12/27/20 2200 12/27/20 2300 12/28/20 0030 12/28/20 0142  BP: (!) 139/44 (!) 124/43 107/74 (!) 123/54  Pulse: 71 72 66 77  Resp: 18 16 15 16   Temp:      TempSrc:      SpO2: 96% 96% 95% 98%  Weight:    86.7 kg  Height:    4\' 10"  (1.473 m)   Eyes: Anicteric no pallor. ENMT: No discharge from the ears eyes nose and mouth. Neck: No mass felt.  No neck rigidity. Respiratory: No rhonchi or crepitations. Cardiovascular: S1-S2 heard. Abdomen: Soft nontender bowel sounds present. Musculoskeletal: No  edema. Skin: No rash. Neurologic: Alert awake oriented to time place and person.  Moves all extremities. Psychiatric: Appears normal.  Normal affect.   Labs on Admission: I have personally reviewed following labs and imaging studies  CBC: Recent Labs  Lab 12/27/20 2020  WBC 11.6*  HGB 10.7*  HCT 32.8*  MCV 90.1  PLT 312   Basic Metabolic Panel: Recent Labs  Lab 12/27/20 2020  NA 141  K 3.3*  CL 99  CO2 32  GLUCOSE 136*  BUN 12  CREATININE 0.78  CALCIUM 7.9*  MG 1.2*   GFR: Estimated Creatinine Clearance: 63.8 mL/min (by C-G formula based on SCr of 0.78 mg/dL). Liver Function Tests: No results for input(s):  AST, ALT, ALKPHOS, BILITOT, PROT, ALBUMIN in the last 168 hours. No results for input(s): LIPASE, AMYLASE in the last 168 hours. No results for input(s): AMMONIA in the last 168 hours. Coagulation Profile: No results for input(s): INR, PROTIME in the last 168 hours. Cardiac Enzymes: No results for input(s): CKTOTAL, CKMB, CKMBINDEX, TROPONINI in the last 168 hours. BNP (last 3 results) No results for input(s): PROBNP in the last 8760 hours. HbA1C: No results for input(s): HGBA1C in the last 72 hours. CBG: No results for input(s): GLUCAP in the last 168 hours. Lipid Profile: No results for input(s): CHOL, HDL, LDLCALC, TRIG, CHOLHDL, LDLDIRECT in the last 72 hours. Thyroid Function Tests: No results for input(s): TSH, T4TOTAL, FREET4, T3FREE, THYROIDAB in the last 72 hours. Anemia Panel: No results for input(s): VITAMINB12, FOLATE, FERRITIN, TIBC, IRON, RETICCTPCT in the last 72 hours. Urine analysis:    Component Value Date/Time   COLORURINE STRAW (A) 05/14/2019 1313   APPEARANCEUR HAZY (A) 05/14/2019 1313   LABSPEC 1.006 05/14/2019 1313   PHURINE 6.0 05/14/2019 1313   GLUCOSEU NEGATIVE 05/14/2019 1313   HGBUR NEGATIVE 05/14/2019 1313   BILIRUBINUR NEGATIVE 05/14/2019 1313   BILIRUBINUR negative 09/20/2017 1749   BILIRUBINUR neg 05/14/2014 1515    KETONESUR NEGATIVE 05/14/2019 1313   PROTEINUR NEGATIVE 05/14/2019 1313   UROBILINOGEN 0.2 09/20/2017 1749   UROBILINOGEN 0.2 07/19/2014 0927   NITRITE NEGATIVE 05/14/2019 1313   LEUKOCYTESUR LARGE (A) 05/14/2019 1313   Sepsis Labs: @LABRCNTIP (procalcitonin:4,lacticidven:4) ) Recent Results (from the past 240 hour(s))  Resp Panel by RT-PCR (Flu A&B, Covid) Nasopharyngeal Swab     Status: None   Collection Time: 12/27/20 11:00 PM   Specimen: Nasopharyngeal Swab; Nasopharyngeal(NP) swabs in vial transport medium  Result Value Ref Range Status   SARS Coronavirus 2 by RT PCR NEGATIVE NEGATIVE Final    Comment: (NOTE) SARS-CoV-2 target nucleic acids are NOT DETECTED.  The SARS-CoV-2 RNA is generally detectable in upper respiratory specimens during the acute phase of infection. The lowest concentration of SARS-CoV-2 viral copies this assay can detect is 138 copies/mL. A negative result does not preclude SARS-Cov-2 infection and should not be used as the sole basis for treatment or other patient management decisions. A negative result may occur with  improper specimen collection/handling, submission of specimen other than nasopharyngeal swab, presence of viral mutation(s) within the areas targeted by this assay, and inadequate number of viral copies(<138 copies/mL). A negative result must be combined with clinical observations, patient history, and epidemiological information. The expected result is Negative.  Fact Sheet for Patients:  BloggerCourse.com  Fact Sheet for Healthcare Providers:  SeriousBroker.it  This test is no t yet approved or cleared by the [place] FDA and  has been authorized for detection and/or diagnosis of SARS-CoV-2 by FDA under an Emergency Use Authorization (EUA). This EUA will remain  in effect (meaning this test can be used) for the duration of the COVID-19 declaration under Section 564(b)(1) of the  Act, 21 U.S.C.section 360bbb-3(b)(1), unless the authorization is terminated  or revoked sooner.       Influenza A by PCR NEGATIVE NEGATIVE Final   Influenza B by PCR NEGATIVE NEGATIVE Final    Comment: (NOTE) The Xpert Xpress SARS-CoV-2/FLU/RSV plus assay is intended as an aid in the diagnosis of influenza from Nasopharyngeal swab specimens and should not be used as a sole basis for treatment. Nasal washings and aspirates are unacceptable for Xpert Xpress SARS-CoV-2/FLU/RSV testing.  Fact Sheet for Patients: BloggerCourse.com  Fact Sheet  for Healthcare Providers: SeriousBroker.it  This test is not yet approved or cleared by the [place] and has been authorized for detection and/or diagnosis of SARS-CoV-2 by FDA under an Emergency Use Authorization (EUA). This EUA will remain in effect (meaning this test can be used) for the duration of the COVID-19 declaration under Section 564(b)(1) of the Act, 21 U.S.C. section 360bbb-3(b)(1), unless the authorization is terminated or revoked.  Performed at [facility], [address] [place], [place] [address]      Radiological Exams on Admission: DG Chest Portable 1 View  Result Date: 12/27/2020 CLINICAL DATA:  Chest pain, shortness of breath EXAM: PORTABLE CHEST 1 VIEW COMPARISON:  06/17/2019 FINDINGS: Prior median sternotomy. Aortic atherosclerosis. Heart and mediastinal contours are within normal limits. No focal opacities or effusions. No acute bony abnormality. IMPRESSION: No active cardiopulmonary disease. Electronically Signed   By: Charlett Nose M.D.   On: 12/27/2020 20:21    EKG: Independently reviewed.  Normal sinus rhythm.  Assessment/Plan Principal Problem:   Chest pain Active Problems:   Obstructive sleep apnea   Essential hypertension, benign   Type 2 diabetes mellitus (HCC)    Chest pain concerning for angina given the history of  vascular disease and prior history of tobacco abuse and diabetes mellitus.  Will trend cardiac markers keep patient on aspirin and statins and patient is already on beta-blockers.  Consult cardiology. Hypertension on beta-blockers.  Holding hydrochlorothiazide due to electrolyte imbalance.  As needed IV hydralazine.  Will need to confirm if patient was taking ARB or not. Hypokalemia and hypomagnesemia could be due to diuretics.  Replace recheck. Peripheral vascular disease and history of right lower innominate surgery on statins and aspirin. Sleep apnea on CPAP. Diabetes mellitus type we will keep patient on sliding scale coverage.  Patient states her last hemoglobin A1c was around 6. Anemia follow CBC.  Appears to be chronic.  Check anemia panel with next blood draw.   DVT prophylaxis: Lovenox. Code Status: Full code. Family Communication: Discussed with patient. Disposition Plan: Home. Consults called: Cardiology. Admission status: Observation.   Eduard Clos MD [facility] Pager [phone].  If 7PM-7AM, please contact night-coverage [email or web address] Password [email or web address]  12/28/2020, 2:28 AM

## 2020-12-28 NOTE — Progress Notes (Signed)
*  PRELIMINARY RESULTS* Echocardiogram 2D Echocardiogram has been performed.  Luisa Hart RDCS 12/28/2020, 8:41 AM

## 2020-12-28 NOTE — Discharge Summary (Signed)
Physician Discharge Summary  Diane Giles [id number] DOB: January 30, 1956 DOA: 12/27/2020  PCP: Buzzy Han, MD  Admit date: 12/27/2020 Discharge date: 12/28/2020  Time spent: 40 minutes  Recommendations for Outpatient Follow-up:  Follow outpatient CBC/CMP Follow cardiology outpatient (she declined at time of evaluation here), consider ischemic eval outpatient  Patient on thiazide and loop diuretic - consider discontinuing 1 due to risks of electrolyte abnormalities  Follow and workup anemia further outpatient as needed  Discharge Diagnoses:  Principal Problem:   Chest pain Active Problems:   Obstructive sleep apnea   Essential hypertension, benign   Type 2 diabetes mellitus ([place])   PAD (peripheral artery disease) ([place])   Discharge Condition: stable  Diet recommendation: heart healthy, diabetic  Filed Weights   12/27/20 1951 12/28/20 0142  Weight: 83.9 kg 86.7 kg    History of present illness:  Diane Giles is Diane Giles 65 y.o. female with history of aortoiliac bypass in 2000 following which patient had stenting in 2018 also aorto innominate artery surgery in 2021 presents to the ER with complaint of chest pain.  Patient started having chest pain waking up yesterday morning which was substernal pressure-like nonradiating with no associated shortness of breath productive cough or fever chills.  Pain is persistent even at rest.  Denies any abdominal pain had some nausea.  No vomiting.   She was ruled out for ACS with negative trops and ekg without concerning findings.  Her echo was without wall motion abnormalities.  Cardiology saw her and recommended outpatient stress test, but she's declined cardiology follow up at this time.  See below for additional [facility] Course:  Chest pain Ruled out for ACS - negative troponins, reassuring EKG.   Echo without wall motion abnormalities Cardiology was c/s given her risk factors, discussed possibility of outpatient stress  test - but she's declining cardiology follow up at this time As noted, with hx vascular dz, T2DM, and hx tobacco abuse, she has significant cardiac risk factors  Continue aspirin, statin  Hypertension Resume home meds on discharge Atenolol/chlorthalidone, losartan, lasix Recommended she discuss her thiazide and loop diuretic with her PCP (recommended changing this, but she prefers to follow with PCP)   Hypokalemia and hypomagnesemia could be due to diuretics.  Replace recheck.  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  Sleep apnea on CPAP.  T2DM A1c 6.3 Follow, continue home meds  Anemia follow CBC.  Appears to be chronic.   Work up further outpatient.    Procedures: Echo IMPRESSIONS     1. Left ventricular ejection fraction, by estimation, is 70 to 75%. The  left ventricle has hyperdynamic function. The left ventricle has no  regional wall motion abnormalities. Left ventricular diastolic parameters  are indeterminate.   2. Right ventricular systolic function is normal. The right ventricular  size is normal. There is normal pulmonary artery systolic pressure.   3. The mitral valve is normal in structure. No evidence of mitral valve  regurgitation. No evidence of mitral stenosis.   4. The aortic valve is calcified. Aortic valve regurgitation is not  visualized. Mild aortic valve stenosis.   Consultations: cardiology  Discharge Exam: Vitals:   12/28/20 1100 12/28/20 1300  BP: (!) 135/59 (!) 140/58  Pulse: 63 64  Resp: 16 17  Temp: 97.7 F (36.5 C) 98 F (36.7  2 TABLETS TWICE DAILY WITH MEALS. What changed:  how much to take how to take this when to take this additional instructions   pregabalin 75 MG capsule Commonly known as: LYRICA Take 75 mg by mouth 2 (two) times daily.       Allergies  Allergen Reactions   Codeine Itching      The results of significant diagnostics from this hospitalization (including imaging, microbiology, ancillary and laboratory) are listed below for reference.    Significant Diagnostic Studies: DG Chest Portable 1 View  Result Date: 12/27/2020 CLINICAL DATA:  Chest pain, shortness of breath EXAM: PORTABLE CHEST 1 VIEW COMPARISON:  06/17/2019 FINDINGS: Prior median sternotomy. Aortic atherosclerosis. Heart and mediastinal contours are within normal limits. No focal opacities or effusions. No acute bony abnormality. IMPRESSION: No active cardiopulmonary disease. Electronically Signed   By: Rolm Baptise M.D.   On: 12/27/2020 20:21   ECHOCARDIOGRAM  COMPLETE  Result Date: 12/28/2020    ECHOCARDIOGRAM REPORT   Patient Name:   Diane Giles Date of Exam: 12/28/2020 Medical Rec #:  [id number]        Height:       58.0 in Accession #:    [id number]       Weight:       191.1 lb Date of Birth:  1956/02/12        BSA:          1.786 m Patient Age:    3 years         BP:           123/54 mmHg Patient Gender: F                HR:           71 bpm. Exam Location:  Inpatient Procedure: 2D Echo, Cardiac Doppler and Color Doppler Indications:    Chest pain  History:        Patient has prior history of Echocardiogram examinations, most                 recent 04/22/2019. Signs/Symptoms:Murmur; Risk Factors:Diabetes,                 Former Smoker and Hypertension. 12/03/2016 Abdominal aorta                 procedure                 05/18/2019 Aorta abypass.  Sonographer:    Luisa Hart RDCS Referring Phys: [place]  1. Left ventricular ejection fraction, by estimation, is 70 to 75%. The left ventricle has hyperdynamic function. The left ventricle has no regional wall motion abnormalities. Left ventricular diastolic parameters are indeterminate.  2. Right ventricular systolic function is normal. The right ventricular size is normal. There is normal pulmonary artery systolic pressure.  3. The mitral valve is normal in structure. No evidence of mitral valve regurgitation. No evidence of mitral stenosis.  4. The aortic valve is calcified. Aortic valve regurgitation is not visualized. Mild aortic valve stenosis. FINDINGS  Left Ventricle: Left ventricular ejection fraction, by estimation, is 70 to 75%. The left ventricle has hyperdynamic function. The left ventricle has no regional wall motion abnormalities. The left ventricular internal cavity size was small. There is no  left ventricular hypertrophy. Left ventricular diastolic parameters are indeterminate. Right Ventricle: The right ventricular size is normal. Right vetricular wall thickness was not  well visualized. Right ventricular systolic function is normal. There  Physician Discharge Summary  Diane Giles [id number] DOB: January 30, 1956 DOA: 12/27/2020  PCP: Buzzy Han, MD  Admit date: 12/27/2020 Discharge date: 12/28/2020  Time spent: 40 minutes  Recommendations for Outpatient Follow-up:  Follow outpatient CBC/CMP Follow cardiology outpatient (she declined at time of evaluation here), consider ischemic eval outpatient  Patient on thiazide and loop diuretic - consider discontinuing 1 due to risks of electrolyte abnormalities  Follow and workup anemia further outpatient as needed  Discharge Diagnoses:  Principal Problem:   Chest pain Active Problems:   Obstructive sleep apnea   Essential hypertension, benign   Type 2 diabetes mellitus ([place])   PAD (peripheral artery disease) ([place])   Discharge Condition: stable  Diet recommendation: heart healthy, diabetic  Filed Weights   12/27/20 1951 12/28/20 0142  Weight: 83.9 kg 86.7 kg    History of present illness:  Diane Giles is Diane Giles 65 y.o. female with history of aortoiliac bypass in 2000 following which patient had stenting in 2018 also aorto innominate artery surgery in 2021 presents to the ER with complaint of chest pain.  Patient started having chest pain waking up yesterday morning which was substernal pressure-like nonradiating with no associated shortness of breath productive cough or fever chills.  Pain is persistent even at rest.  Denies any abdominal pain had some nausea.  No vomiting.   She was ruled out for ACS with negative trops and ekg without concerning findings.  Her echo was without wall motion abnormalities.  Cardiology saw her and recommended outpatient stress test, but she's declined cardiology follow up at this time.  See below for additional [facility] Course:  Chest pain Ruled out for ACS - negative troponins, reassuring EKG.   Echo without wall motion abnormalities Cardiology was c/s given her risk factors, discussed possibility of outpatient stress  test - but she's declining cardiology follow up at this time As noted, with hx vascular dz, T2DM, and hx tobacco abuse, she has significant cardiac risk factors  Continue aspirin, statin  Hypertension Resume home meds on discharge Atenolol/chlorthalidone, losartan, lasix Recommended she discuss her thiazide and loop diuretic with her PCP (recommended changing this, but she prefers to follow with PCP)   Hypokalemia and hypomagnesemia could be due to diuretics.  Replace recheck.  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  Sleep apnea on CPAP.  T2DM A1c 6.3 Follow, continue home meds  Anemia follow CBC.  Appears to be chronic.   Work up further outpatient.    Procedures: Echo IMPRESSIONS     1. Left ventricular ejection fraction, by estimation, is 70 to 75%. The  left ventricle has hyperdynamic function. The left ventricle has no  regional wall motion abnormalities. Left ventricular diastolic parameters  are indeterminate.   2. Right ventricular systolic function is normal. The right ventricular  size is normal. There is normal pulmonary artery systolic pressure.   3. The mitral valve is normal in structure. No evidence of mitral valve  regurgitation. No evidence of mitral stenosis.   4. The aortic valve is calcified. Aortic valve regurgitation is not  visualized. Mild aortic valve stenosis.   Consultations: cardiology  Discharge Exam: Vitals:   12/28/20 1100 12/28/20 1300  BP: (!) 135/59 (!) 140/58  Pulse: 63 64  Resp: 16 17  Temp: 97.7 F (36.5 C) 98 F (36.7  Physician Discharge Summary  Diane Giles [id number] DOB: January 30, 1956 DOA: 12/27/2020  PCP: Buzzy Han, MD  Admit date: 12/27/2020 Discharge date: 12/28/2020  Time spent: 40 minutes  Recommendations for Outpatient Follow-up:  Follow outpatient CBC/CMP Follow cardiology outpatient (she declined at time of evaluation here), consider ischemic eval outpatient  Patient on thiazide and loop diuretic - consider discontinuing 1 due to risks of electrolyte abnormalities  Follow and workup anemia further outpatient as needed  Discharge Diagnoses:  Principal Problem:   Chest pain Active Problems:   Obstructive sleep apnea   Essential hypertension, benign   Type 2 diabetes mellitus ([place])   PAD (peripheral artery disease) ([place])   Discharge Condition: stable  Diet recommendation: heart healthy, diabetic  Filed Weights   12/27/20 1951 12/28/20 0142  Weight: 83.9 kg 86.7 kg    History of present illness:  Diane Giles is Diane Giles 65 y.o. female with history of aortoiliac bypass in 2000 following which patient had stenting in 2018 also aorto innominate artery surgery in 2021 presents to the ER with complaint of chest pain.  Patient started having chest pain waking up yesterday morning which was substernal pressure-like nonradiating with no associated shortness of breath productive cough or fever chills.  Pain is persistent even at rest.  Denies any abdominal pain had some nausea.  No vomiting.   She was ruled out for ACS with negative trops and ekg without concerning findings.  Her echo was without wall motion abnormalities.  Cardiology saw her and recommended outpatient stress test, but she's declined cardiology follow up at this time.  See below for additional [facility] Course:  Chest pain Ruled out for ACS - negative troponins, reassuring EKG.   Echo without wall motion abnormalities Cardiology was c/s given her risk factors, discussed possibility of outpatient stress  test - but she's declining cardiology follow up at this time As noted, with hx vascular dz, T2DM, and hx tobacco abuse, she has significant cardiac risk factors  Continue aspirin, statin  Hypertension Resume home meds on discharge Atenolol/chlorthalidone, losartan, lasix Recommended she discuss her thiazide and loop diuretic with her PCP (recommended changing this, but she prefers to follow with PCP)   Hypokalemia and hypomagnesemia could be due to diuretics.  Replace recheck.  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  Sleep apnea on CPAP.  T2DM A1c 6.3 Follow, continue home meds  Anemia follow CBC.  Appears to be chronic.   Work up further outpatient.    Procedures: Echo IMPRESSIONS     1. Left ventricular ejection fraction, by estimation, is 70 to 75%. The  left ventricle has hyperdynamic function. The left ventricle has no  regional wall motion abnormalities. Left ventricular diastolic parameters  are indeterminate.   2. Right ventricular systolic function is normal. The right ventricular  size is normal. There is normal pulmonary artery systolic pressure.   3. The mitral valve is normal in structure. No evidence of mitral valve  regurgitation. No evidence of mitral stenosis.   4. The aortic valve is calcified. Aortic valve regurgitation is not  visualized. Mild aortic valve stenosis.   Consultations: cardiology  Discharge Exam: Vitals:   12/28/20 1100 12/28/20 1300  BP: (!) 135/59 (!) 140/58  Pulse: 63 64  Resp: 16 17  Temp: 97.7 F (36.5 C) 98 F (36.7  2 TABLETS TWICE DAILY WITH MEALS. What changed:  how much to take how to take this when to take this additional instructions   pregabalin 75 MG capsule Commonly known as: LYRICA Take 75 mg by mouth 2 (two) times daily.       Allergies  Allergen Reactions   Codeine Itching      The results of significant diagnostics from this hospitalization (including imaging, microbiology, ancillary and laboratory) are listed below for reference.    Significant Diagnostic Studies: DG Chest Portable 1 View  Result Date: 12/27/2020 CLINICAL DATA:  Chest pain, shortness of breath EXAM: PORTABLE CHEST 1 VIEW COMPARISON:  06/17/2019 FINDINGS: Prior median sternotomy. Aortic atherosclerosis. Heart and mediastinal contours are within normal limits. No focal opacities or effusions. No acute bony abnormality. IMPRESSION: No active cardiopulmonary disease. Electronically Signed   By: Rolm Baptise M.D.   On: 12/27/2020 20:21   ECHOCARDIOGRAM  COMPLETE  Result Date: 12/28/2020    ECHOCARDIOGRAM REPORT   Patient Name:   Diane Giles Date of Exam: 12/28/2020 Medical Rec #:  [id number]        Height:       58.0 in Accession #:    [id number]       Weight:       191.1 lb Date of Birth:  1956/02/12        BSA:          1.786 m Patient Age:    3 years         BP:           123/54 mmHg Patient Gender: F                HR:           71 bpm. Exam Location:  Inpatient Procedure: 2D Echo, Cardiac Doppler and Color Doppler Indications:    Chest pain  History:        Patient has prior history of Echocardiogram examinations, most                 recent 04/22/2019. Signs/Symptoms:Murmur; Risk Factors:Diabetes,                 Former Smoker and Hypertension. 12/03/2016 Abdominal aorta                 procedure                 05/18/2019 Aorta abypass.  Sonographer:    Luisa Hart RDCS Referring Phys: [place]  1. Left ventricular ejection fraction, by estimation, is 70 to 75%. The left ventricle has hyperdynamic function. The left ventricle has no regional wall motion abnormalities. Left ventricular diastolic parameters are indeterminate.  2. Right ventricular systolic function is normal. The right ventricular size is normal. There is normal pulmonary artery systolic pressure.  3. The mitral valve is normal in structure. No evidence of mitral valve regurgitation. No evidence of mitral stenosis.  4. The aortic valve is calcified. Aortic valve regurgitation is not visualized. Mild aortic valve stenosis. FINDINGS  Left Ventricle: Left ventricular ejection fraction, by estimation, is 70 to 75%. The left ventricle has hyperdynamic function. The left ventricle has no regional wall motion abnormalities. The left ventricular internal cavity size was small. There is no  left ventricular hypertrophy. Left ventricular diastolic parameters are indeterminate. Right Ventricle: The right ventricular size is normal. Right vetricular wall thickness was not  well visualized. Right ventricular systolic function is normal. There  2 TABLETS TWICE DAILY WITH MEALS. What changed:  how much to take how to take this when to take this additional instructions   pregabalin 75 MG capsule Commonly known as: LYRICA Take 75 mg by mouth 2 (two) times daily.       Allergies  Allergen Reactions   Codeine Itching      The results of significant diagnostics from this hospitalization (including imaging, microbiology, ancillary and laboratory) are listed below for reference.    Significant Diagnostic Studies: DG Chest Portable 1 View  Result Date: 12/27/2020 CLINICAL DATA:  Chest pain, shortness of breath EXAM: PORTABLE CHEST 1 VIEW COMPARISON:  06/17/2019 FINDINGS: Prior median sternotomy. Aortic atherosclerosis. Heart and mediastinal contours are within normal limits. No focal opacities or effusions. No acute bony abnormality. IMPRESSION: No active cardiopulmonary disease. Electronically Signed   By: Rolm Baptise M.D.   On: 12/27/2020 20:21   ECHOCARDIOGRAM  COMPLETE  Result Date: 12/28/2020    ECHOCARDIOGRAM REPORT   Patient Name:   Diane Giles Date of Exam: 12/28/2020 Medical Rec #:  [id number]        Height:       58.0 in Accession #:    [id number]       Weight:       191.1 lb Date of Birth:  1956/02/12        BSA:          1.786 m Patient Age:    3 years         BP:           123/54 mmHg Patient Gender: F                HR:           71 bpm. Exam Location:  Inpatient Procedure: 2D Echo, Cardiac Doppler and Color Doppler Indications:    Chest pain  History:        Patient has prior history of Echocardiogram examinations, most                 recent 04/22/2019. Signs/Symptoms:Murmur; Risk Factors:Diabetes,                 Former Smoker and Hypertension. 12/03/2016 Abdominal aorta                 procedure                 05/18/2019 Aorta abypass.  Sonographer:    Luisa Hart RDCS Referring Phys: [place]  1. Left ventricular ejection fraction, by estimation, is 70 to 75%. The left ventricle has hyperdynamic function. The left ventricle has no regional wall motion abnormalities. Left ventricular diastolic parameters are indeterminate.  2. Right ventricular systolic function is normal. The right ventricular size is normal. There is normal pulmonary artery systolic pressure.  3. The mitral valve is normal in structure. No evidence of mitral valve regurgitation. No evidence of mitral stenosis.  4. The aortic valve is calcified. Aortic valve regurgitation is not visualized. Mild aortic valve stenosis. FINDINGS  Left Ventricle: Left ventricular ejection fraction, by estimation, is 70 to 75%. The left ventricle has hyperdynamic function. The left ventricle has no regional wall motion abnormalities. The left ventricular internal cavity size was small. There is no  left ventricular hypertrophy. Left ventricular diastolic parameters are indeterminate. Right Ventricle: The right ventricular size is normal. Right vetricular wall thickness was not  well visualized. Right ventricular systolic function is normal. There

## 2020-12-28 NOTE — Consult Note (Signed)
left ventricle has hyperdynamic function. The left ventricle has no  regional wall motion abnormalities. Left ventricular diastolic parameters  are indeterminate.   2. Right ventricular systolic function is normal. The right ventricular  size is normal. There is normal pulmonary artery systolic pressure.   3. The mitral valve is normal in structure. No evidence of mitral valve  regurgitation. No evidence of mitral stenosis.   4. The aortic valve is calcified. Aortic valve regurgitation is not  visualized. Mild aortic valve stenosis.   Laboratory Data:  High Sensitivity Troponin:   Recent Labs  Lab 12/27/20 2020 12/27/20 2248 12/28/20 0250 12/28/20 0532  TROPONINIHS '3 3 7 4     '$ Chemistry Recent Labs  Lab 12/27/20 2020 12/28/20 0250  NA 141 138  K 3.3* 4.0  CL 99 103  CO2 32 26  GLUCOSE 136* 121*  BUN 12 10  CREATININE 0.78 0.87  CALCIUM 7.9* 8.8*  GFRNONAA >60 >60  ANIONGAP 10 9    Recent Labs  Lab 12/28/20 0250  PROT 6.3*  ALBUMIN 3.0*  AST 18  ALT 17  ALKPHOS 117  BILITOT 0.6   Hematology Recent Labs  Lab 12/27/20 2020 12/28/20 0250  WBC 11.6* 9.5  RBC 3.64* 3.63*  HGB 10.7* 10.7*  HCT 32.8* 33.3*  MCV 90.1 91.7  MCH 29.4 29.5  MCHC 32.6 32.1  RDW 14.0 14.0  PLT 312 312   BNPNo results for input(s): BNP, PROBNP in the last 168 hours.  DDimer No results for input(s): DDIMER in the last 168 hours.   Radiology/Studies:  DG Chest Portable 1 View  Result Date: 12/27/2020 CLINICAL DATA:  Chest pain, shortness of breath EXAM: PORTABLE CHEST 1 VIEW COMPARISON:  06/17/2019 FINDINGS: Prior median sternotomy. Aortic atherosclerosis. Heart and mediastinal contours are within normal limits. No focal opacities or effusions. No acute bony abnormality. IMPRESSION: No active cardiopulmonary disease. Electronically Signed   By: Rolm Baptise M.D.   On: 12/27/2020 20:21   ECHOCARDIOGRAM COMPLETE  Result Date: 12/28/2020    ECHOCARDIOGRAM REPORT   Patient Name:   Diane Giles Date of Exam: 12/28/2020 Medical Rec #:  [id number]        Height:       58.0 in Accession #:    [id number]       Weight:       191.1 lb Date of Birth:  [birth year]        BSA:          1.786 m Patient Age:    65 years         BP:           123/54 mmHg Patient Gender: F                HR:           71 bpm. Exam Location:  Inpatient Procedure: 2D Echo, Cardiac Doppler and Color Doppler Indications:    Chest pain  History:        Patient has prior history of Echocardiogram examinations, most                 recent 04/22/2019. Signs/Symptoms:Murmur; Risk Factors:Diabetes,                 Former Smoker and Hypertension. 12/03/2016 Abdominal aorta                 procedure  left ventricle has hyperdynamic function. The left ventricle has no  regional wall motion abnormalities. Left ventricular diastolic parameters  are indeterminate.   2. Right ventricular systolic function is normal. The right ventricular  size is normal. There is normal pulmonary artery systolic pressure.   3. The mitral valve is normal in structure. No evidence of mitral valve  regurgitation. No evidence of mitral stenosis.   4. The aortic valve is calcified. Aortic valve regurgitation is not  visualized. Mild aortic valve stenosis.   Laboratory Data:  High Sensitivity Troponin:   Recent Labs  Lab 12/27/20 2020 12/27/20 2248 12/28/20 0250 12/28/20 0532  TROPONINIHS '3 3 7 4     '$ Chemistry Recent Labs  Lab 12/27/20 2020 12/28/20 0250  NA 141 138  K 3.3* 4.0  CL 99 103  CO2 32 26  GLUCOSE 136* 121*  BUN 12 10  CREATININE 0.78 0.87  CALCIUM 7.9* 8.8*  GFRNONAA >60 >60  ANIONGAP 10 9    Recent Labs  Lab 12/28/20 0250  PROT 6.3*  ALBUMIN 3.0*  AST 18  ALT 17  ALKPHOS 117  BILITOT 0.6   Hematology Recent Labs  Lab 12/27/20 2020 12/28/20 0250  WBC 11.6* 9.5  RBC 3.64* 3.63*  HGB 10.7* 10.7*  HCT 32.8* 33.3*  MCV 90.1 91.7  MCH 29.4 29.5  MCHC 32.6 32.1  RDW 14.0 14.0  PLT 312 312   BNPNo results for input(s): BNP, PROBNP in the last 168 hours.  DDimer No results for input(s): DDIMER in the last 168 hours.   Radiology/Studies:  DG Chest Portable 1 View  Result Date: 12/27/2020 CLINICAL DATA:  Chest pain, shortness of breath EXAM: PORTABLE CHEST 1 VIEW COMPARISON:  06/17/2019 FINDINGS: Prior median sternotomy. Aortic atherosclerosis. Heart and mediastinal contours are within normal limits. No focal opacities or effusions. No acute bony abnormality. IMPRESSION: No active cardiopulmonary disease. Electronically Signed   By: Rolm Baptise M.D.   On: 12/27/2020 20:21   ECHOCARDIOGRAM COMPLETE  Result Date: 12/28/2020    ECHOCARDIOGRAM REPORT   Patient Name:   Diane Giles Date of Exam: 12/28/2020 Medical Rec #:  [id number]        Height:       58.0 in Accession #:    [id number]       Weight:       191.1 lb Date of Birth:  [birth year]        BSA:          1.786 m Patient Age:    65 years         BP:           123/54 mmHg Patient Gender: F                HR:           71 bpm. Exam Location:  Inpatient Procedure: 2D Echo, Cardiac Doppler and Color Doppler Indications:    Chest pain  History:        Patient has prior history of Echocardiogram examinations, most                 recent 04/22/2019. Signs/Symptoms:Murmur; Risk Factors:Diabetes,                 Former Smoker and Hypertension. 12/03/2016 Abdominal aorta                 procedure  left ventricle has hyperdynamic function. The left ventricle has no  regional wall motion abnormalities. Left ventricular diastolic parameters  are indeterminate.   2. Right ventricular systolic function is normal. The right ventricular  size is normal. There is normal pulmonary artery systolic pressure.   3. The mitral valve is normal in structure. No evidence of mitral valve  regurgitation. No evidence of mitral stenosis.   4. The aortic valve is calcified. Aortic valve regurgitation is not  visualized. Mild aortic valve stenosis.   Laboratory Data:  High Sensitivity Troponin:   Recent Labs  Lab 12/27/20 2020 12/27/20 2248 12/28/20 0250 12/28/20 0532  TROPONINIHS '3 3 7 4     '$ Chemistry Recent Labs  Lab 12/27/20 2020 12/28/20 0250  NA 141 138  K 3.3* 4.0  CL 99 103  CO2 32 26  GLUCOSE 136* 121*  BUN 12 10  CREATININE 0.78 0.87  CALCIUM 7.9* 8.8*  GFRNONAA >60 >60  ANIONGAP 10 9    Recent Labs  Lab 12/28/20 0250  PROT 6.3*  ALBUMIN 3.0*  AST 18  ALT 17  ALKPHOS 117  BILITOT 0.6   Hematology Recent Labs  Lab 12/27/20 2020 12/28/20 0250  WBC 11.6* 9.5  RBC 3.64* 3.63*  HGB 10.7* 10.7*  HCT 32.8* 33.3*  MCV 90.1 91.7  MCH 29.4 29.5  MCHC 32.6 32.1  RDW 14.0 14.0  PLT 312 312   BNPNo results for input(s): BNP, PROBNP in the last 168 hours.  DDimer No results for input(s): DDIMER in the last 168 hours.   Radiology/Studies:  DG Chest Portable 1 View  Result Date: 12/27/2020 CLINICAL DATA:  Chest pain, shortness of breath EXAM: PORTABLE CHEST 1 VIEW COMPARISON:  06/17/2019 FINDINGS: Prior median sternotomy. Aortic atherosclerosis. Heart and mediastinal contours are within normal limits. No focal opacities or effusions. No acute bony abnormality. IMPRESSION: No active cardiopulmonary disease. Electronically Signed   By: Rolm Baptise M.D.   On: 12/27/2020 20:21   ECHOCARDIOGRAM COMPLETE  Result Date: 12/28/2020    ECHOCARDIOGRAM REPORT   Patient Name:   Diane Giles Date of Exam: 12/28/2020 Medical Rec #:  [id number]        Height:       58.0 in Accession #:    [id number]       Weight:       191.1 lb Date of Birth:  [birth year]        BSA:          1.786 m Patient Age:    65 years         BP:           123/54 mmHg Patient Gender: F                HR:           71 bpm. Exam Location:  Inpatient Procedure: 2D Echo, Cardiac Doppler and Color Doppler Indications:    Chest pain  History:        Patient has prior history of Echocardiogram examinations, most                 recent 04/22/2019. Signs/Symptoms:Murmur; Risk Factors:Diabetes,                 Former Smoker and Hypertension. 12/03/2016 Abdominal aorta                 procedure  Cardiology Consultation:   Patient ID: Diane Giles MRN: [id number]; DOB: Oct 22, 1955  Admit date: 12/27/2020 Date of Consult: 12/28/2020  PCP:  Buzzy Han, MD   Agency Providers Cardiologist:  New to [place]; Dr. Sonda Rumble here to update MD or APP on Care Team, Refresh:1}     Patient Profile:   Diane Giles is a 65 y.o. female with a PMH of PAD s/p aortobiiliac bypass in 2000 with subsequent stenting in 2018, aorto innominate bypass 05/2019, aortic stenosis, HTN, HLD, DM type 2, and former tobacco abuse (quit in 2018), who is being seen 12/28/2020 for the evaluation of chest pain at the request of Dr. Florene Glen.  History of Present Illness:   Ms. Siefer follows with Dr. Scot Dock with vascular surgery for her history of PAD. She underwent aortobiiliac bypass in 2000 with subsequent stenting in 2018. She was noted to have RUE fatigue with exertion and carotid bruits on eval in 2020. She had carotid dopplers revealing innominate artery stenosis. She was seen by Dr. Cyndia Bent with [facility]. 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 She ultimately underwent innominate artery bypass via median sternotomy with Dr. Bascom Levels. Scot Dock 05/2019. She was doing well at her last visit with Dr. Scot Dock 08/2020 and recommended for repeat carotid dopplers in 1 year prior to her next follow-up visit.   She presented to the [place] ED 12/27/20 with complaints of chest pain. She noted onset of CP after waking 12/27/20 and described it as a pressure-like sensation without radiation which has persisted. She had no complaints of associated SOB, diaphoresis, radiation of pain, N/V, dizziness, lightheadedness, or syncope. She has never had chest pain before which prompted her to present to the ED for further evaluation.  Hospital course: mildly hypertensive,  otherwise VSS. Labs notable for K 3.3>4.0, Mg 1.2, Cr 0.78, WBC 11.6, Hgb 10.7, PLT 312, and HsTrop negative x4. COVID-19 negative. EKG showed NSR with rate 77 bpm, no STE/D, no significant change from previous. CXR showed no acute findings. Her potassium and Mg were repleted in the ED. Given extent of her PAD and comorbidities, she was transferred to [facility] for admission and further evaluation. Echo this admission showed EF 70-75%, indeterminate LV diastolic function, no RWMA, and mild AS. Cardiology asked to evaluate for chest pain.   At the time of this evaluation she is chest pain free. She notes resolution of chest pain ~30 minutes ago. No significant change in chest pressure over the past 24 hours. Prior to this she had no issues with chest pain. Husband at beside reports they have started going to the gym more recently, though patient does not recall any activities that may have inadvertently injured her and denies chest wall tenderness. She has chronic LE edema which is unchanged. She denies recent SOB, DOE, orthopnea, or PND. No recent palpitations. No recent travel or illnesses.    Past Medical History:  Diagnosis Date   Aortic stenosis    mild-moderate AS 04/22/19 echo   Aortoiliac occlusive disease ([place]) 12/03/2016   Arthritis    Diabetes mellitus without complication ([place])    per pt she is pre- diabetic   Diverticulitis    Heart murmur    Hypertension    Osteoporosis    Sleep apnea    Tobacco use disorder 07/04/2012    Past Surgical History:  Procedure Laterality Date   ABDOMINAL AORTOGRAM  left ventricle has hyperdynamic function. The left ventricle has no  regional wall motion abnormalities. Left ventricular diastolic parameters  are indeterminate.   2. Right ventricular systolic function is normal. The right ventricular  size is normal. There is normal pulmonary artery systolic pressure.   3. The mitral valve is normal in structure. No evidence of mitral valve  regurgitation. No evidence of mitral stenosis.   4. The aortic valve is calcified. Aortic valve regurgitation is not  visualized. Mild aortic valve stenosis.   Laboratory Data:  High Sensitivity Troponin:   Recent Labs  Lab 12/27/20 2020 12/27/20 2248 12/28/20 0250 12/28/20 0532  TROPONINIHS '3 3 7 4     '$ Chemistry Recent Labs  Lab 12/27/20 2020 12/28/20 0250  NA 141 138  K 3.3* 4.0  CL 99 103  CO2 32 26  GLUCOSE 136* 121*  BUN 12 10  CREATININE 0.78 0.87  CALCIUM 7.9* 8.8*  GFRNONAA >60 >60  ANIONGAP 10 9    Recent Labs  Lab 12/28/20 0250  PROT 6.3*  ALBUMIN 3.0*  AST 18  ALT 17  ALKPHOS 117  BILITOT 0.6   Hematology Recent Labs  Lab 12/27/20 2020 12/28/20 0250  WBC 11.6* 9.5  RBC 3.64* 3.63*  HGB 10.7* 10.7*  HCT 32.8* 33.3*  MCV 90.1 91.7  MCH 29.4 29.5  MCHC 32.6 32.1  RDW 14.0 14.0  PLT 312 312   BNPNo results for input(s): BNP, PROBNP in the last 168 hours.  DDimer No results for input(s): DDIMER in the last 168 hours.   Radiology/Studies:  DG Chest Portable 1 View  Result Date: 12/27/2020 CLINICAL DATA:  Chest pain, shortness of breath EXAM: PORTABLE CHEST 1 VIEW COMPARISON:  06/17/2019 FINDINGS: Prior median sternotomy. Aortic atherosclerosis. Heart and mediastinal contours are within normal limits. No focal opacities or effusions. No acute bony abnormality. IMPRESSION: No active cardiopulmonary disease. Electronically Signed   By: Rolm Baptise M.D.   On: 12/27/2020 20:21   ECHOCARDIOGRAM COMPLETE  Result Date: 12/28/2020    ECHOCARDIOGRAM REPORT   Patient Name:   Diane Giles Date of Exam: 12/28/2020 Medical Rec #:  [id number]        Height:       58.0 in Accession #:    [id number]       Weight:       191.1 lb Date of Birth:  [birth year]        BSA:          1.786 m Patient Age:    65 years         BP:           123/54 mmHg Patient Gender: F                HR:           71 bpm. Exam Location:  Inpatient Procedure: 2D Echo, Cardiac Doppler and Color Doppler Indications:    Chest pain  History:        Patient has prior history of Echocardiogram examinations, most                 recent 04/22/2019. Signs/Symptoms:Murmur; Risk Factors:Diabetes,                 Former Smoker and Hypertension. 12/03/2016 Abdominal aorta                 procedure  Cardiology Consultation:   Patient ID: Diane Giles MRN: [id number]; DOB: Oct 22, 1955  Admit date: 12/27/2020 Date of Consult: 12/28/2020  PCP:  Buzzy Han, MD   Agency Providers Cardiologist:  New to [place]; Dr. Sonda Rumble here to update MD or APP on Care Team, Refresh:1}     Patient Profile:   Diane Giles is a 65 y.o. female with a PMH of PAD s/p aortobiiliac bypass in 2000 with subsequent stenting in 2018, aorto innominate bypass 05/2019, aortic stenosis, HTN, HLD, DM type 2, and former tobacco abuse (quit in 2018), who is being seen 12/28/2020 for the evaluation of chest pain at the request of Dr. Florene Glen.  History of Present Illness:   Ms. Siefer follows with Dr. Scot Dock with vascular surgery for her history of PAD. She underwent aortobiiliac bypass in 2000 with subsequent stenting in 2018. She was noted to have RUE fatigue with exertion and carotid bruits on eval in 2020. She had carotid dopplers revealing innominate artery stenosis. She was seen by Dr. Cyndia Bent with [facility]. 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 She ultimately underwent innominate artery bypass via median sternotomy with Dr. Bascom Levels. Scot Dock 05/2019. She was doing well at her last visit with Dr. Scot Dock 08/2020 and recommended for repeat carotid dopplers in 1 year prior to her next follow-up visit.   She presented to the [place] ED 12/27/20 with complaints of chest pain. She noted onset of CP after waking 12/27/20 and described it as a pressure-like sensation without radiation which has persisted. She had no complaints of associated SOB, diaphoresis, radiation of pain, N/V, dizziness, lightheadedness, or syncope. She has never had chest pain before which prompted her to present to the ED for further evaluation.  Hospital course: mildly hypertensive,  otherwise VSS. Labs notable for K 3.3>4.0, Mg 1.2, Cr 0.78, WBC 11.6, Hgb 10.7, PLT 312, and HsTrop negative x4. COVID-19 negative. EKG showed NSR with rate 77 bpm, no STE/D, no significant change from previous. CXR showed no acute findings. Her potassium and Mg were repleted in the ED. Given extent of her PAD and comorbidities, she was transferred to [facility] for admission and further evaluation. Echo this admission showed EF 70-75%, indeterminate LV diastolic function, no RWMA, and mild AS. Cardiology asked to evaluate for chest pain.   At the time of this evaluation she is chest pain free. She notes resolution of chest pain ~30 minutes ago. No significant change in chest pressure over the past 24 hours. Prior to this she had no issues with chest pain. Husband at beside reports they have started going to the gym more recently, though patient does not recall any activities that may have inadvertently injured her and denies chest wall tenderness. She has chronic LE edema which is unchanged. She denies recent SOB, DOE, orthopnea, or PND. No recent palpitations. No recent travel or illnesses.    Past Medical History:  Diagnosis Date   Aortic stenosis    mild-moderate AS 04/22/19 echo   Aortoiliac occlusive disease ([place]) 12/03/2016   Arthritis    Diabetes mellitus without complication ([place])    per pt she is pre- diabetic   Diverticulitis    Heart murmur    Hypertension    Osteoporosis    Sleep apnea    Tobacco use disorder 07/04/2012    Past Surgical History:  Procedure Laterality Date   ABDOMINAL AORTOGRAM  Cardiology Consultation:   Patient ID: Diane Giles MRN: [id number]; DOB: Oct 22, 1955  Admit date: 12/27/2020 Date of Consult: 12/28/2020  PCP:  Buzzy Han, MD   Agency Providers Cardiologist:  New to [place]; Dr. Sonda Rumble here to update MD or APP on Care Team, Refresh:1}     Patient Profile:   Diane Giles is a 65 y.o. female with a PMH of PAD s/p aortobiiliac bypass in 2000 with subsequent stenting in 2018, aorto innominate bypass 05/2019, aortic stenosis, HTN, HLD, DM type 2, and former tobacco abuse (quit in 2018), who is being seen 12/28/2020 for the evaluation of chest pain at the request of Dr. Florene Glen.  History of Present Illness:   Ms. Siefer follows with Dr. Scot Dock with vascular surgery for her history of PAD. She underwent aortobiiliac bypass in 2000 with subsequent stenting in 2018. She was noted to have RUE fatigue with exertion and carotid bruits on eval in 2020. She had carotid dopplers revealing innominate artery stenosis. She was seen by Dr. Cyndia Bent with [facility]. 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 She ultimately underwent innominate artery bypass via median sternotomy with Dr. Bascom Levels. Scot Dock 05/2019. She was doing well at her last visit with Dr. Scot Dock 08/2020 and recommended for repeat carotid dopplers in 1 year prior to her next follow-up visit.   She presented to the [place] ED 12/27/20 with complaints of chest pain. She noted onset of CP after waking 12/27/20 and described it as a pressure-like sensation without radiation which has persisted. She had no complaints of associated SOB, diaphoresis, radiation of pain, N/V, dizziness, lightheadedness, or syncope. She has never had chest pain before which prompted her to present to the ED for further evaluation.  Hospital course: mildly hypertensive,  otherwise VSS. Labs notable for K 3.3>4.0, Mg 1.2, Cr 0.78, WBC 11.6, Hgb 10.7, PLT 312, and HsTrop negative x4. COVID-19 negative. EKG showed NSR with rate 77 bpm, no STE/D, no significant change from previous. CXR showed no acute findings. Her potassium and Mg were repleted in the ED. Given extent of her PAD and comorbidities, she was transferred to [facility] for admission and further evaluation. Echo this admission showed EF 70-75%, indeterminate LV diastolic function, no RWMA, and mild AS. Cardiology asked to evaluate for chest pain.   At the time of this evaluation she is chest pain free. She notes resolution of chest pain ~30 minutes ago. No significant change in chest pressure over the past 24 hours. Prior to this she had no issues with chest pain. Husband at beside reports they have started going to the gym more recently, though patient does not recall any activities that may have inadvertently injured her and denies chest wall tenderness. She has chronic LE edema which is unchanged. She denies recent SOB, DOE, orthopnea, or PND. No recent palpitations. No recent travel or illnesses.    Past Medical History:  Diagnosis Date   Aortic stenosis    mild-moderate AS 04/22/19 echo   Aortoiliac occlusive disease ([place]) 12/03/2016   Arthritis    Diabetes mellitus without complication ([place])    per pt she is pre- diabetic   Diverticulitis    Heart murmur    Hypertension    Osteoporosis    Sleep apnea    Tobacco use disorder 07/04/2012    Past Surgical History:  Procedure Laterality Date   ABDOMINAL AORTOGRAM

## 2021-01-05 ENCOUNTER — Ambulatory Visit
Admission: RE | Admit: 2021-01-05 | Discharge: 2021-01-05 | Disposition: A | Discharge status: Home / Self Care | Payer: Medicare Other | Source: Ambulatory Visit | Attending: Family Medicine | Admitting: Family Medicine

## 2021-01-05 ENCOUNTER — Other Ambulatory Visit: Payer: Self-pay

## 2021-01-05 ENCOUNTER — Other Ambulatory Visit: Payer: Self-pay | Admitting: Family Medicine

## 2021-01-05 ENCOUNTER — Ambulatory Visit (INDEPENDENT_AMBULATORY_CARE_PROVIDER_SITE_OTHER): Payer: Medicare Other | Admitting: Podiatry

## 2021-01-05 DIAGNOSIS — M10372 Gout due to renal impairment, left ankle and foot: Secondary | ICD-10-CM

## 2021-01-05 DIAGNOSIS — M79672 Pain in left foot: Secondary | ICD-10-CM

## 2021-01-06 LAB — CBC WITH DIFFERENTIAL/PLATELET
Basophils Absolute: 0 10*3/uL (ref 0.0–0.2)
Basos: 0 %
EOS (ABSOLUTE): 0 10*3/uL (ref 0.0–0.4)
Eos: 0 %
Hematocrit: 33.3 % — ABNORMAL LOW (ref 34.0–46.6)
Hemoglobin: 11.2 g/dL (ref 11.1–15.9)
Immature Grans (Abs): 0.1 10*3/uL (ref 0.0–0.1)
Immature Granulocytes: 1 %
Lymphocytes Absolute: 1.4 10*3/uL (ref 0.7–3.1)
Lymphs: 9 %
MCH: 29.8 pg (ref 26.6–33.0)
MCHC: 33.6 g/dL (ref 31.5–35.7)
MCV: 89 fL (ref 79–97)
Monocytes Absolute: 0.5 10*3/uL (ref 0.1–0.9)
Monocytes: 3 %
Neutrophils Absolute: 14 10*3/uL — ABNORMAL HIGH (ref 1.4–7.0)
Neutrophils: 87 %
Platelets: 435 10*3/uL (ref 150–450)
RBC: 3.76 x10E6/uL — ABNORMAL LOW (ref 3.77–5.28)
RDW: 12.8 % (ref 11.7–15.4)
WBC: 16.1 10*3/uL — ABNORMAL HIGH (ref 3.4–10.8)

## 2021-01-06 LAB — ARTHRITIS PANEL
Anti Nuclear Antibody (ANA): NEGATIVE
Rheumatoid fact SerPl-aCnc: 10.4 IU/mL (ref <14.0)
Sed Rate: 52 mm/hr — ABNORMAL HIGH (ref 0–40)
Uric Acid: 11.5 mg/dL — ABNORMAL HIGH (ref 3.0–7.2)

## 2021-01-06 LAB — BASIC METABOLIC PANEL
BUN/Creatinine Ratio: 26 (ref 12–28)
BUN: 24 mg/dL (ref 8–27)
CO2: 25 mmol/L (ref 20–29)
Calcium: 8.6 mg/dL — ABNORMAL LOW (ref 8.7–10.3)
Chloride: 91 mmol/L — ABNORMAL LOW (ref 96–106)
Creatinine, Ser: 0.93 mg/dL (ref 0.57–1.00)
Glucose: 147 mg/dL — ABNORMAL HIGH (ref 65–99)
Potassium: 3.8 mmol/L (ref 3.5–5.2)
Sodium: 136 mmol/L (ref 134–144)
eGFR: 67 mL/min/{1.73_m2} (ref >59)

## 2021-01-06 MED ORDER — COLCHICINE 0.6 MG PO TABS: ORAL_TABLET | ORAL | 2 refills | Status: DC

## 2021-01-10 NOTE — Progress Notes (Signed)
Subjective:  Patient ID: ROCIO Giles, female    DOB: 1955-06-26,  MRN: [id number]  Chief Complaint  Patient presents with   Foot Pain      Urgent Work In-left foot severe pain    65 y.o. female presents with the above complaint. History confirmed with patient.  She has had significant swelling in the left foot and ankle since Sunday.  Is progressively getting worse.  She the same thing happened back in the spring on the right side and this feels very similar.  Her PCP put her on a Medrol Dosepak which she just tarted and is starting to improve.  She had x-rays done at [facility] today  Objective:  Physical Exam: warm, good capillary refill, no trophic changes or ulcerative lesions, normal DP and PT pulses, and normal sensory exam. Left Foot: Diffuse pain swelling and tenderness around the anterior ankle and dorsal midfoot  I reviewed her radiographs from [facility] no acute osseous abnormalities noted there is soft tissue swelling Assessment:   1. 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  I think likely etiology is gout or other polyarticular arthritis.  She has no family history of this or personal history previously.  I ordered lab work for this including an arthritic panel basic metabolic panel and CBC.  Her lab work did return this afternoon and it was concerning for gout I sent her a prescription for colchicine as well she should take this if the methylprednisolone does not improve this.  We discussed chronic gout management as well and she will discuss this with her PCP if she has these recurrent attacks in the future.  Return if symptoms worsen or fail to improve.

## 2021-06-01 ENCOUNTER — Ambulatory Visit (HOSPITAL_BASED_OUTPATIENT_CLINIC_OR_DEPARTMENT_OTHER): Payer: Medicare Other | Admitting: Orthopaedic Surgery

## 2021-06-05 ENCOUNTER — Ambulatory Visit (HOSPITAL_BASED_OUTPATIENT_CLINIC_OR_DEPARTMENT_OTHER)
Admission: RE | Admit: 2021-06-05 | Discharge: 2021-06-05 | Disposition: A | Discharge status: Home / Self Care | Payer: Medicare Other | Source: Ambulatory Visit | Attending: Orthopaedic Surgery | Admitting: Orthopaedic Surgery

## 2021-06-05 ENCOUNTER — Ambulatory Visit (INDEPENDENT_AMBULATORY_CARE_PROVIDER_SITE_OTHER): Payer: Medicare Other | Admitting: Orthopaedic Surgery

## 2021-06-05 ENCOUNTER — Other Ambulatory Visit: Payer: Self-pay

## 2021-06-05 ENCOUNTER — Other Ambulatory Visit (HOSPITAL_BASED_OUTPATIENT_CLINIC_OR_DEPARTMENT_OTHER): Payer: Self-pay | Admitting: Orthopaedic Surgery

## 2021-06-05 DIAGNOSIS — M25561 Pain in right knee: Secondary | ICD-10-CM | POA: Insufficient documentation

## 2021-06-05 DIAGNOSIS — M25562 Pain in left knee: Secondary | ICD-10-CM

## 2021-06-05 DIAGNOSIS — M1711 Unilateral primary osteoarthritis, right knee: Secondary | ICD-10-CM

## 2021-06-05 NOTE — Progress Notes (Signed)
Chief Complaint: Right knee pain     History of Present Illness:    Diane Giles is a 66 y.o. female with history of right knee pain now going on for approximately 15 years.  She states that the knee feels sore and achy all over.  She states that she attempts to be active and is walking more than a mile a day at the gym at [place] although it starts to buckle and be painful after approximately more than 1 month.  She states that she has significant difficulty going from sit to stand.  She has pain even while at rest while sitting.  She is working significantly on trying to lose weight that she did gain from [place].  She has lost 25 pounds recently.  She has previously had injections at [facility] which were many years prior.  These did not provide any type of significant relief.  She has taken over-the-counter anti-inflammatories with minimal relief as well.  She is here today seeking a more definitive solution.  She does have a history of diabetes with a last A1c of 6.3    Surgical History:   History of total hip arthroplasty done at [facility] many years prior  PMH/PSH/Family History/Social History/Meds/Allergies:    Past Medical History:  Diagnosis Date   Aortic stenosis    mild-moderate AS 04/22/19 echo   Aortoiliac occlusive disease ([place]) 12/03/2016   Arthritis    Diabetes mellitus without complication ([place])    per pt she is pre- diabetic   Diverticulitis    Heart murmur    Hypertension    Osteoporosis    Sleep apnea    Tobacco use disorder 07/04/2012   Past Surgical History:  Procedure Laterality Date   ABDOMINAL AORTOGRAM W/LOWER EXTREMITY N/A 12/03/2016   Procedure: Abdominal Aortogram w/Lower Extremity;  Surgeon: Angelia Mould, MD;  Location: [facility];  Service: Cardiovascular;  Laterality: N/A;   AORTA -INNOMIATE BYPASS N/A 05/18/2019   Procedure: AORTA -INNOMIATE BYPASS Using Hemashield Gold Graft Size 71mm;   Surgeon: Gaye Pollack, MD;  Location: [facility];  Service: Open Heart Surgery;  Laterality: N/A;   DILATATION & CURETTAGE/HYSTEROSCOPY WITH MYOSURE N/A 12/27/2015   Procedure: DILATATION & CURETTAGE/HYSTEROSCOPY;  Surgeon: Nunzio Cobbs, MD;  Location: [facility];  Service: Gynecology;  Laterality: N/A;   DILATION AND CURETTAGE OF UTERUS  11/2015   ELBOW SURGERY Left ~2007   tendon repair by Dr. Veverly Fells   EYE SURGERY Bilateral 2008   lasik   JOINT REPLACEMENT     lower aortic bypass  2000   per patient   PERIPHERAL VASCULAR INTERVENTION  12/03/2016   Procedure: Peripheral Vascular Intervention;  Surgeon: Angelia Mould, MD;  Location: [facility];  Service: Cardiovascular;;   STERNOTOMY  05/18/2019   Procedure: Sternotomy;  Surgeon: Gaye Pollack, MD;  Location[facility];  Service: Open Heart Surgery;;   TOTAL HIP ARTHROPLASTY Right 07/27/2014   Procedure: RIGHT TOTAL HIP ARTHROPLASTY ANTERIOR APPROACH;  Surgeon: Paralee Cancel, MD;  Location: [facility];  Service: Orthopedics;  Laterality: Right;   Social History   Socioeconomic History   Marital status: Married    Spouse name: Not on file   Number of children: Not on file   Years of education: Not on file  Highest education level: Not on file  Occupational History   Occupation: Art gallery manager  Tobacco Use   Smoking status: Former    Years: 40.00    Types: Cigarettes    Quit date: 02/23/2017    Years since quitting: 4.2   Smokeless tobacco: Never  Vaping Use   Vaping Use: Never used  Substance and Sexual Activity   Alcohol use: Yes    Alcohol/week: 0.0 standard drinks    Comment: very rare--1/month   Drug use: No   Sexual activity: Yes    Partners: Male    Birth control/protection: Post-menopausal  Other Topics Concern   Not on file  Social History Narrative   Married. Patient does not exercise. She has a Gaffer.   Right handed   Lives with husband in two story home   Social Determinants of  Health   Financial Resource Strain: Not on file  Food Insecurity: Not on file  Transportation Needs: Not on file  Physical Activity: Not on file  Stress: Not on file  Social Connections: Not on file   Family History  Problem Relation Age of Onset   Cancer Father 45       Dec age 66 with pancreatic Ca   Migraines Mother    Thyroid disease Mother        hypothyroid   Allergies  Allergen Reactions   Codeine Itching   Current Outpatient Medications  Medication Sig Dispense Refill   acetaminophen (TYLENOL) 500 MG tablet Take 1,000 mg by mouth every 6 (six) hours as needed for mild pain.     aspirin 81 MG chewable tablet Chew 81 mg by mouth daily.     atenolol-chlorthalidone (TENORETIC) 50-25 MG tablet TAKE 1 TABLET IN THE MORNING. (Patient taking differently: Take 1 tablet by mouth daily.) 30 tablet 0   atorvastatin (LIPITOR) 80 MG tablet Take 1 tablet (80 mg total) by mouth daily at 6 PM. 90 tablet 3   clobetasol cream (TEMOVATE) 6.57 % Apply 1 application topically 2 (two) times daily as needed (rash).     colchicine 0.6 MG tablet Take 1.2mg  (2 tablets) then 0.6mg  (1 tablet) 1 hour after. Then, take 1 tablet every day for 7 days. 10 tablet 2   furosemide (LASIX) 20 MG tablet Take 1 tablet (20 mg total) by mouth every morning. 90 tablet 1   Lancets MISC Use as directed once a day. (DX: R73.9) 100 each 2   losartan (COZAAR) 25 MG tablet TAKE 1 TABLET IN THE P.M. (Patient taking differently: Take 12.5 mg by mouth every evening.) 90 tablet 3   metFORMIN (GLUCOPHAGE) 500 MG tablet TAKE 2 TABLETS TWICE DAILY WITH MEALS. (Patient taking differently: Take 1,000 mg by mouth 2 (two) times daily with a meal.) 360 tablet 3   pregabalin (LYRICA) 75 MG capsule Take 75 mg by mouth 2 (two) times daily.     No current facility-administered medications for this visit.   No results found.  Review of Systems:   A ROS was performed including pertinent positives and negatives as documented in the  HPI.  Physical Exam :   Constitutional: NAD and appears stated age Neurological: Alert and oriented Psych: Appropriate affect and cooperative Last menstrual period 05/07/2005.   Comprehensive Musculoskeletal Exam:      Musculoskeletal Exam  Gait Normal  Alignment Normal   Right Left  Inspection Normal Normal  Palpation    Tenderness tricompartmental none  Crepitus positive None  Effusion Mild None  Range of  Motion    Extension 0 0  Flexion 135 135  Strength    Extension 5/5 5/5  Flexion 5/5 5/5  Ligament Exam     Generalized Laxity No No  Lachman Negative Negative   Pivot Shift Negative Negative  Anterior Drawer Negative Negative  Valgus at 0 Negative Negative  Valgus at 20 Negative Negative  Varus at 0 0 0  Varus at 20   0 0  Posterior Drawer at 90 0 0  Vascular/Lymphatic Exam    Edema None None  Venous Stasis Changes No No  Distal Circulation Normal Normal  Neurologic    Light Touch Sensation Intact Intact  Special Tests:      Imaging:   Xray (4 views right knee): Moderate to severe tricompartmental osteoarthritis   I personally reviewed and interpreted the radiographs.   Assessment:   Sick 73-year-old female with moderate to severe tricompartmental osteoarthritis.  At this time she is attempting to stay active and is recently lost 25 pounds but significantly is limited after a mile of walking with regard to the right knee.  She has had injections in the past with limited relief.  At this time she is seeking a more permanent solution.  I did counsel her on additional options like bracing versus PRP.  At this time she would like a more definitive solution.  As result I would like to refer her to Dr. Erlinda Hong for a discussion of right total knee arthroplasty  Plan :    -Plan for referral to Dr. Erlinda Hong for right knee total knee arthroplasty     I personally saw and evaluated the patient, and participated in the management and treatment plan.  Vanetta Mulders,  MD Attending Physician, Orthopedic Surgery  This document was dictated using Dragon voice recognition software. A reasonable attempt at proof reading has been made to minimize errors.

## 2021-06-20 ENCOUNTER — Other Ambulatory Visit: Payer: Self-pay

## 2021-06-20 ENCOUNTER — Encounter: Payer: Self-pay | Admitting: Orthopaedic Surgery

## 2021-06-20 ENCOUNTER — Ambulatory Visit (INDEPENDENT_AMBULATORY_CARE_PROVIDER_SITE_OTHER): Payer: Medicare Other | Admitting: Orthopaedic Surgery

## 2021-06-20 DIAGNOSIS — M1711 Unilateral primary osteoarthritis, right knee: Secondary | ICD-10-CM | POA: Insufficient documentation

## 2021-06-20 NOTE — Progress Notes (Signed)
Office Visit Note   Patient: Diane Giles           Date of Birth: Jan 15, 1956           MRN: [id number] Visit Date: 06/20/2021              Requested by: Huel Cote, MD [address] [place],  [place] [address] PCP: Margot Ables, MD   Assessment & Plan: Visit Diagnoses:  1. Primary osteoarthritis of right knee     Plan: Impression is end-stage right knee DJD.  She has undergone extensive conservative management for several years and now she feels that the relief is no longer enough to allow her to do daily activities.  She is very fearful that she will fall because of the giving way.  Based on her treatment options she has decided to move forward with a right total knee replacement.  She is a well controlled diabetic.  Risk benefits rehab recovery prognosis reviewed with the patient in detail.  She had no complications with her hip replacement.  Denies nickel allergy or history of DVT.  Follow-Up Instructions: No follow-ups on file.   Orders:  No orders of the defined types were placed in this encounter.  No orders of the defined types were placed in this encounter.     Procedures: No procedures performed   Clinical Data: No additional findings.   Subjective: Chief Complaint  Patient presents with   Right Knee - Pain    HPI  Diane Giles is a very pleasant 66 year old female here for evaluation of chronic right knee pain from DJD.  She is a referral from Dr. Pamalee Leyden.  She has pain throughout her knee with giving way.  She has had cortisone injections in the past with temporary relief.  She is not able to walk as much as she would like due to the pain.  She has fear of falling due to the constant giving way.  Review of Systems  Constitutional: Negative.   HENT: Negative.    Eyes: Negative.   Respiratory: Negative.    Cardiovascular: Negative.   Endocrine: Negative.   Musculoskeletal: Negative.   Neurological: Negative.    Hematological: Negative.   Psychiatric/Behavioral: Negative.    All other systems reviewed and are negative.   Objective: Vital Signs: LMP 05/07/2005 (Approximate)   Physical Exam Vitals and nursing note reviewed.  Constitutional:      Appearance: She is well-developed.  Pulmonary:     Effort: Pulmonary effort is normal.  Skin:    General: Skin is warm.     Capillary Refill: Capillary refill takes less than 2 seconds.  Neurological:     Mental Status: She is alert and oriented to person, place, and time.  Psychiatric:        Behavior: Behavior normal.        Thought Content: Thought content normal.        Judgment: Judgment normal.    Ortho Exam  Examination of the right knee shows patellofemoral crepitus with range of motion.  No joint effusion.  Collaterals and cruciates are stable.  Specialty Comments:  No specialty comments available.  Imaging: No results found.   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   Aortoiliac occlusive disease (HCC) 12/03/2016   Centrilobular emphysema (  HCC) 10/03/2016   Right groin pain 02/27/2016   Sacroiliitis (HCC) 02/27/2016   Postmenopausal bleeding 12/01/2015   Psoas tendinitis of right side 10/21/2015   Endometrial thickening on ultra sound 07/28/2015   S/P right THA, 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   Type 2 diabetes mellitus (HCC) 05/18/2014   DDD (degenerative disc disease) 03/18/2013   Metabolic syndrome 07/29/2012   Essential hypertension, benign 07/18/2012   Hyperlipidemia 07/18/2012   Obstructive sleep apnea 07/04/2012   Osteoporosis 07/04/2012   Morbid obesity (HCC) 07/04/2012   Past Medical History:  Diagnosis Date   Aortic stenosis    mild-moderate AS 04/22/19 echo   Aortoiliac occlusive disease (HCC) 12/03/2016   Arthritis     Diabetes mellitus without complication (HCC)    per pt she is pre- diabetic   Diverticulitis    Heart murmur    Hypertension    Osteoporosis    Sleep apnea    Tobacco use disorder 07/04/2012    Family History  Problem Relation Age of Onset   Cancer Father 6       Dec age 41 with pancreatic Ca   Migraines Mother    Thyroid disease Mother        hypothyroid    Past Surgical History:  Procedure Laterality Date   ABDOMINAL AORTOGRAM W/LOWER EXTREMITY N/A 12/03/2016   Procedure: Abdominal Aortogram w/Lower Extremity;  Surgeon: Chuck Hint, MD;  Location: [facility] INVASIVE [facility];  Service: Cardiovascular;  Laterality: N/A;   AORTA -INNOMIATE BYPASS N/A 05/18/2019   Procedure: AORTA -INNOMIATE BYPASS Using Hemashield Gold Graft Size 8mm;  Surgeon: Alleen Borne, MD;  Location: [facility];  Service: Open Heart Surgery;  Laterality: N/A;   DILATATION & CURETTAGE/HYSTEROSCOPY WITH MYOSURE N/A 12/27/2015   Procedure: DILATATION & CURETTAGE/HYSTEROSCOPY;  Surgeon: Patton Salles, MD;  Location: [facility];  Service: Gynecology;  Laterality: N/A;   DILATION AND CURETTAGE OF UTERUS  11/2015   ELBOW SURGERY Left ~2007   tendon repair by Dr. Ranell Patrick   EYE SURGERY Bilateral 2008   lasik   JOINT REPLACEMENT     lower aortic bypass  2000   per patient   PERIPHERAL VASCULAR INTERVENTION  12/03/2016   Procedure: Peripheral Vascular Intervention;  Surgeon: Chuck Hint, MD;  Location: [facility] INVASIVE CV LAB;  Service: Cardiovascular;;   STERNOTOMY  05/18/2019   Procedure: Sternotomy;  Surgeon: Alleen Borne, MD;  Location: [facility];  Service: Open Heart Surgery;;   TOTAL HIP ARTHROPLASTY Right 07/27/2014   Procedure: RIGHT TOTAL HIP ARTHROPLASTY ANTERIOR APPROACH;  Surgeon: Durene Romans, MD;  Location: [facility];  Service: Orthopedics;  Laterality: Right;   Social History   Occupational History   Occupation: Tree surgeon  Tobacco Use   Smoking status: Former    Years: 40.00    Types:  Cigarettes    Quit date: 02/23/2017    Years since quitting: 4.3   Smokeless tobacco: Never  Vaping Use   Vaping Use: Never used  Substance and Sexual Activity   Alcohol use: Yes    Alcohol/week: 0.0 standard drinks    Comment: very rare--1/month   Drug use: No   Sexual activity: Yes    Partners: Male    Birth control/protection: Post-menopausal

## 2021-08-18 NOTE — Pre-Procedure Instructions (Signed)
Surgical Instructions ? ? ? Your procedure is scheduled on Monday, April 24th. ? Report to [facility] Main Entrance "A" at 7:45 A.M., then check in with the Admitting office. ? Call this number if you have problems the morning of surgery: ? [phone] ? ? If you have any questions prior to your surgery date call [phone]: Open Monday-Friday 8am-4pm ? ? ? Remember: ? Do not eat after midnight the night before your surgery ? ?You may drink clear liquids until 6:45 a.m. the morning of your surgery.   ?Clear liquids allowed are: Water, Non-Citrus Juices (without pulp), Carbonated Beverages, Clear Tea, Black Coffee ONLY (NO MILK, CREAM OR POWDERED CREAMER of any kind), and Gatorade. ? ?Patient Instructions ? ? ?The day of surgery (if you have diabetes): ? ?Drink ONE small 12 oz bottle of Gatorade G2 by 6:45 am the morning of surgery ?This bottle was given to you during your hospital  ?pre-op appointment visit.  ?Nothing else to drink after completing the  ?Small 12 oz bottle of Gatorade G2. ? ?       If you have questions, please contact your surgeon?s office. ? ?  ? Take these medicines the morning of surgery with A SIP OF WATER:  ?allopurinol (ZYLOPRIM)  ?Colchicine ?pregabalin (LYRICA)  ?acetaminophen (TYLENOL)-as needed ? ?Follow your surgeon's instructions on when to stop Aspirin.  If no instructions were given by your surgeon then you will need to call the office to get those instructions.   ? ?As of today, STOP taking any Aleve, Naproxen, Ibuprofen, Motrin, Advil, Goody's, BC's, all herbal medications, fish oil, and all vitamins. ? ?WHAT DO I DO ABOUT MY DIABETES MEDICATION? ? ? ?Do not take metFORMIN (GLUCOPHAGE) the morning of surgery. ? ?The day of surgery, do not take other diabetes injectables, including OZEMPIC. ? ? ?HOW TO MANAGE YOUR DIABETES ?BEFORE AND AFTER SURGERY ? ?Why is it important to control my blood sugar before and after surgery? ?Improving blood sugar levels before and after surgery  helps healing and can limit problems. ?A way of improving blood sugar control is eating a healthy diet by: ? Eating less sugar and carbohydrates ? Increasing activity/exercise ? Talking with your doctor about reaching your blood sugar goals ?High blood sugars (greater than 180 mg/dL) can raise your risk of infections and slow your recovery, so you will need to focus on controlling your diabetes during the weeks before surgery. ?Make sure that the doctor who takes care of your diabetes knows about your planned surgery including the date and location. ? ?How do I manage my blood sugar before surgery? ?Check your blood sugar at least 4 times a day, starting 2 days before surgery, to make sure that the level is not too high or low. ? ?Check your blood sugar the morning of your surgery when you wake up and every 2 hours until you get to the Short Stay unit. ? ?If your blood sugar is less than 70 mg/dL, you will need to treat for low blood sugar: ?Do not take insulin. ?Treat a low blood sugar (less than 70 mg/dL) with ? cup of clear juice (cranberry or apple), 4 glucose tablets, OR glucose gel. ?Recheck blood sugar in 15 minutes after treatment (to make sure it is greater than 70 mg/dL). If your blood sugar is not greater than 70 mg/dL on recheck, call [phone] for further instructions. ?Report your blood sugar to the short stay nurse when you get to Short Stay. ? ?If you are  admitted to the hospital after surgery: ?Your blood sugar will be checked by the staff and you will probably be given insulin after surgery (instead of oral diabetes medicines) to make sure you have good blood sugar levels. ?The goal for blood sugar control after surgery is 80-180 mg/dL. ? ?         ?Do not wear jewelry or makeup ?Do not wear lotions, powders, perfumes, or deodorant. ?Do not shave 48 hours prior to surgery.   ?Do not bring valuables to the hospital. ?Do not wear nail polish, gel polish, artificial nails, or any other type of  covering on natural nails (fingers and toes) ?If you have artificial nails or gel coating that need to be removed by a nail salon, please have this removed prior to surgery. Artificial nails or gel coating may interfere with anesthesia's ability to adequately monitor your vital signs. ? ?[facility] is not responsible for any belongings or valuables. .  ? ?Do NOT Smoke (Tobacco/Vaping)  24 hours prior to your procedure ? ?If you use a CPAP at night, you may bring your mask for your overnight stay. ?  ?Contacts, glasses, hearing aids, dentures or partials may not be worn into surgery, please bring cases for these belongings ?  ?For patients admitted to the hospital, discharge time will be determined by your treatment team. ?  ?Patients discharged the day of surgery will not be allowed to drive home, and someone needs to stay with them for 24 hours. ? ? ?SURGICAL WAITING ROOM VISITATION ?Patients having surgery or a procedure in a hospital may have two support people. ?Children under the age of 68 must have an adult with them who is not the patient. ?They may stay in the waiting area during the procedure and may switch out with other visitors. If the patient needs to stay at the hospital during part of their recovery, the visitor guidelines for inpatient rooms apply. ? ?Please refer to the [facility] website for the visitor guidelines for Inpatients (after your surgery is over and you are in a regular room).  ? ? ? ? ? ?Special instructions:   ? ?Oral Hygiene is also important to reduce your risk of infection.  Remember - BRUSH YOUR TEETH THE MORNING OF SURGERY WITH YOUR REGULAR TOOTHPASTE ? ? ?[facility]- Preparing For Surgery ? ?Before surgery, you can play an important role. Because skin is not sterile, your skin needs to be as free of germs as possible. You can reduce the number of germs on your skin by washing with CHG (chlorahexidine gluconate) Soap before surgery.  CHG is an antiseptic cleaner which kills  germs and bonds with the skin to continue killing germs even after washing.   ? ? ?Please do not use if you have an allergy to CHG or antibacterial soaps. If your skin becomes reddened/irritated stop using the CHG.  ?Do not shave (including legs and underarms) for at least 48 hours prior to first CHG shower. It is OK to shave your face. ? ?Please follow these instructions carefully. ?  ? ? Shower the NIGHT BEFORE SURGERY and the MORNING OF SURGERY with CHG Soap.  ? If you chose to wash your hair, wash your hair first as usual with your normal shampoo. After you shampoo, rinse your hair and body thoroughly to remove the shampoo.  Then [employer] and genitals (private parts) with your normal soap and rinse thoroughly to remove soap. ? ?After that Use CHG Soap as you would  any other liquid soap. You can apply CHG directly to the skin and wash gently with a scrungie or a clean washcloth.  ? ?Apply the CHG Soap to your body ONLY FROM THE NECK DOWN.  Do not use on open wounds or open sores. Avoid contact with your eyes, ears, mouth and genitals (private parts). Wash Face and genitals (private parts)  with your normal soap.  ? ?Wash thoroughly, paying special attention to the area where your surgery will be performed. ? ?Thoroughly rinse your body with warm water from the neck down. ? ?DO NOT shower/wash with your normal soap after using and rinsing off the CHG Soap. ? ?Pat yourself dry with a CLEAN TOWEL. ? ?Wear CLEAN PAJAMAS to bed the night before surgery ? ?Place CLEAN SHEETS on your bed the night before your surgery ? ?DO NOT SLEEP WITH PETS. ? ? ?Day of Surgery: ? ?Take a shower with CHG soap. ?Wear Clean/Comfortable clothing the morning of surgery ?Do not apply any deodorants/lotions.   ?Remember to brush your teeth WITH YOUR REGULAR TOOTHPASTE. ? ? ? ?If you received a COVID test during your pre-op visit  it is requested that you wear a mask when out in public, stay away from anyone that may not be feeling well and  notify your surgeon if you develop symptoms. If you have been in contact with anyone that has tested positive in the last 10 days please notify you surgeon. ? ?  ?Please read over the following fact sheets that

## 2021-08-21 ENCOUNTER — Other Ambulatory Visit: Payer: Self-pay

## 2021-08-21 ENCOUNTER — Encounter (HOSPITAL_COMMUNITY): Payer: Self-pay

## 2021-08-21 ENCOUNTER — Other Ambulatory Visit: Payer: Self-pay | Admitting: Physician Assistant

## 2021-08-21 ENCOUNTER — Encounter (HOSPITAL_COMMUNITY)
Admission: RE | Admit: 2021-08-21 | Discharge: 2021-08-21 | Disposition: A | Discharge status: Home / Self Care | Payer: Medicare Other | Source: Ambulatory Visit | Attending: Orthopaedic Surgery | Admitting: Orthopaedic Surgery

## 2021-08-21 ENCOUNTER — Telehealth: Payer: Self-pay

## 2021-08-21 ENCOUNTER — Telehealth: Payer: Self-pay | Admitting: Orthopaedic Surgery

## 2021-08-21 VITALS — BP 119/55 | HR 76 | Temp 97.6°F | Resp 18 | Ht 58.5 in | Wt 157.0 lb

## 2021-08-21 DIAGNOSIS — Z87891 Personal history of nicotine dependence: Secondary | ICD-10-CM | POA: Diagnosis not present

## 2021-08-21 DIAGNOSIS — Z01812 Encounter for preprocedural laboratory examination: Secondary | ICD-10-CM | POA: Insufficient documentation

## 2021-08-21 DIAGNOSIS — I2510 Atherosclerotic heart disease of native coronary artery without angina pectoris: Secondary | ICD-10-CM | POA: Insufficient documentation

## 2021-08-21 DIAGNOSIS — I10 Essential (primary) hypertension: Secondary | ICD-10-CM | POA: Diagnosis not present

## 2021-08-21 DIAGNOSIS — Z955 Presence of coronary angioplasty implant and graft: Secondary | ICD-10-CM | POA: Diagnosis not present

## 2021-08-21 DIAGNOSIS — G4733 Obstructive sleep apnea (adult) (pediatric): Secondary | ICD-10-CM | POA: Insufficient documentation

## 2021-08-21 DIAGNOSIS — I7409 Other arterial embolism and thrombosis of abdominal aorta: Secondary | ICD-10-CM | POA: Insufficient documentation

## 2021-08-21 DIAGNOSIS — I708 Atherosclerosis of other arteries: Secondary | ICD-10-CM | POA: Diagnosis not present

## 2021-08-21 DIAGNOSIS — Z01818 Encounter for other preprocedural examination: Secondary | ICD-10-CM

## 2021-08-21 DIAGNOSIS — M1711 Unilateral primary osteoarthritis, right knee: Secondary | ICD-10-CM

## 2021-08-21 LAB — COMPREHENSIVE METABOLIC PANEL
ALT: 16 U/L (ref 0–44)
AST: 21 U/L (ref 15–41)
Albumin: 3.4 g/dL — ABNORMAL LOW (ref 3.5–5.0)
Alkaline Phosphatase: 154 U/L — ABNORMAL HIGH (ref 38–126)
Anion gap: 10 (ref 5–15)
BUN: 11 mg/dL (ref 8–23)
CO2: 30 mmol/L (ref 22–32)
Calcium: 9.3 mg/dL (ref 8.9–10.3)
Chloride: 101 mmol/L (ref 98–111)
Creatinine, Ser: 0.82 mg/dL (ref 0.44–1.00)
GFR, Estimated: >60 mL/min (ref >60)
Glucose, Bld: 117 mg/dL — ABNORMAL HIGH (ref 70–99)
Potassium: 2.9 mmol/L — ABNORMAL LOW (ref 3.5–5.1)
Sodium: 141 mmol/L (ref 135–145)
Total Bilirubin: 0.5 mg/dL (ref 0.3–1.2)
Total Protein: 6.8 g/dL (ref 6.5–8.1)

## 2021-08-21 LAB — CBC
HCT: 35.6 % — ABNORMAL LOW (ref 36.0–46.0)
Hemoglobin: 11.9 g/dL — ABNORMAL LOW (ref 12.0–15.0)
MCH: 31.4 pg (ref 26.0–34.0)
MCHC: 33.4 g/dL (ref 30.0–36.0)
MCV: 93.9 fL (ref 80.0–100.0)
Platelets: 361 10*3/uL (ref 150–400)
RBC: 3.79 MIL/uL — ABNORMAL LOW (ref 3.87–5.11)
RDW: 15.9 % — ABNORMAL HIGH (ref 11.5–15.5)
WBC: 9.9 10*3/uL (ref 4.0–10.5)
nRBC: 0 % (ref 0.0–0.2)

## 2021-08-21 LAB — GLUCOSE, CAPILLARY: Glucose-Capillary: 103 mg/dL — ABNORMAL HIGH (ref 70–99)

## 2021-08-21 LAB — SURGICAL PCR SCREEN
MRSA, PCR: NEGATIVE
Staphylococcus aureus: POSITIVE — AB

## 2021-08-21 MED ORDER — OXYCODONE-ACETAMINOPHEN 5-325 MG PO TABS: 1.0000 | ORAL_TABLET | Freq: Four times a day (QID) | ORAL | 0 refills | Status: DC | PRN

## 2021-08-21 MED ORDER — SULFAMETHOXAZOLE-TRIMETHOPRIM 800-160 MG PO TABS: 1.0000 | ORAL_TABLET | Freq: Two times a day (BID) | ORAL | 0 refills | Status: DC

## 2021-08-21 MED ORDER — ONDANSETRON HCL 4 MG PO TABS: 4.0000 mg | ORAL_TABLET | Freq: Three times a day (TID) | ORAL | 0 refills | Status: DC | PRN

## 2021-08-21 MED ORDER — METHOCARBAMOL 500 MG PO TABS
500.0000 mg | ORAL_TABLET | Freq: Two times a day (BID) | ORAL | 2 refills | Status: DC | PRN
Start: 2021-08-21 — End: 2021-10-26

## 2021-08-21 MED ORDER — POTASSIUM CHLORIDE CRYS ER 10 MEQ PO TBCR: EXTENDED_RELEASE_TABLET | ORAL | 0 refills | Status: DC

## 2021-08-21 MED ORDER — DOCUSATE SODIUM 100 MG PO CAPS: 100.0000 mg | ORAL_CAPSULE | Freq: Every day | ORAL | 2 refills | Status: DC | PRN

## 2021-08-21 MED ORDER — ASPIRIN EC 81 MG PO TBEC: 81.0000 mg | DELAYED_RELEASE_TABLET | Freq: Two times a day (BID) | ORAL | 0 refills | Status: DC

## 2021-08-21 NOTE — Telephone Encounter (Signed)
Spoke with patient concerning potassium. She wanted to know if she should discontinue her baby aspirin? She went for preop but was told to ask our office. She is scheduled for joint replacement on 08/28/2021. ?Thanks! ? ?

## 2021-08-21 NOTE — Progress Notes (Signed)
Called and notified patient.

## 2021-08-21 NOTE — Telephone Encounter (Signed)
She needs to start potassium.  She also needs to come into office for RN visit to draw prealbumin please. Thank you.

## 2021-08-21 NOTE — Progress Notes (Addendum)
PCP - Margretta Sidle, MD ?Cardiologist - denies ? ?PPM/ICD - denies ?Device Orders - n/a ?Rep Notified - n/a ? ?Chest x-ray - 12/27/2020 ?EKG - 12/30/2020 ?Stress Test - denies ?ECHO - 12/28/2020 ?Cardiac Cath - denies ? ?Sleep Study - yes, positive for OSA ?CPAP - yes (10 - 17) ? ?Fasting Blood Sugar - 94 - 125 ?Checks Blood Sugar once a day ?CBG today - 103 ?A1C - done 3 weeks ago in PCP office (6.0 per patient) - result requested ? ?Blood Thinner Instructions: n/a ? ?Aspirin Instructions: last dose - patient was instructed to call MD and ask if she must hold Aspirin prior to surgery. Patient verbalized understanding. ? ?Patient was instructed: As of today, STOP taking any Aleve, Naproxen, Ibuprofen, Motrin, Advil, Goody's, BC's, all herbal medications, fish oil, and all vitamins. ?  ?ERAS Protcol - yes ? ?PRE-SURGERY - G2- until 06:45 o'clock ? ?COVID TEST- n/a ? ?Anesthesia review: yes - cardiac history, abnormal labs in PAT (doctor Erlinda Hong was notified) ? ?Patient denies shortness of breath, fever, cough and chest pain at PAT appointment ? ? ?All instructions explained to the patient, with a verbal understanding of the material. Patient agrees to go over the instructions while at home for a better understanding. Patient also instructed to self quarantine after being tested for COVID-19. The opportunity to ask questions was provided. ?  ?

## 2021-08-21 NOTE — Telephone Encounter (Signed)
I sent in potassium earlier this am

## 2021-08-21 NOTE — Progress Notes (Signed)
Abnormal labs in PAT: K 2.9; Alkaline Phosphatase 154; Albumin 3.4. Dr. Erlinda Hong office was called and notified.  ?

## 2021-08-21 NOTE — Telephone Encounter (Signed)
Discontinue aspirin 5 days pre-op.  She needs to come into office asap for pre-albumin blood draw

## 2021-08-21 NOTE — Telephone Encounter (Signed)
Tried calling to advise.  ?

## 2021-08-21 NOTE — Progress Notes (Signed)
Can you call this patient and let her know that potassium is low and that I have called in supplements to start taking asap

## 2021-08-21 NOTE — Telephone Encounter (Signed)
Patient is scheduled  08-29-21 for  RIGHT TOTAL HIP.  Diane Lemmings, RN with Preadmission testing calling to provide abnormal results:  ? ?POTASSIUM:  2.9 ? ?ALBUMIN:   3.4 ? ?ALKALINE:  154 ?

## 2021-08-21 NOTE — Telephone Encounter (Signed)
Relayed information to patient. She will be here tomorrow morning for labwork as well.  ?

## 2021-08-22 ENCOUNTER — Ambulatory Visit: Payer: Medicare Other

## 2021-08-22 ENCOUNTER — Encounter (HOSPITAL_COMMUNITY): Payer: Self-pay | Admitting: Vascular Surgery

## 2021-08-22 DIAGNOSIS — M1711 Unilateral primary osteoarthritis, right knee: Secondary | ICD-10-CM

## 2021-08-22 NOTE — Telephone Encounter (Signed)
Patient came in for blood work this AM ? ?

## 2021-08-22 NOTE — Progress Notes (Addendum)
Anesthesia Chart Review: ? Case: [id number] Date/Time: 08/28/21 0941  ? Procedure: RIGHT TOTAL KNEE ARTHROPLASTY (Right: Knee)  ? Anesthesia type: Spinal  ? Pre-op diagnosis: RIGH KNEE OSTEOARTHRITIS  ? [facility]: [facility] RO[facility] 04 / [facility]  ? Surgeons: Leandrew Koyanagi, MD  ? ?  ? ? ?DISCUSSION: Patient is a 66 year old female scheduled for the above procedure. ? 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  ? ?She is not routinely followed by cardiology.  She did have a coronary CT done 04/2019 prior to innominate artery bypass that showed mild non-obstructive CAD (25-49% pLAD, 0-25% RCA). She was also evaluated by Pixie Casino, MD on 12/28/20 during a chest pain admission 12/27/20-12/28/20.  Troponins negative x4. EKG showed NSR. K and Mg repleted in ED. Echo showed EF 70-75%, no RWMA, mild AS. She had not had prior chest pain but had been going to the gym, although did not recall injuring her chest or shoulder. Her chest pain resolved. Cardiology follow-up considered to determine possibility of future testing, but she declined further intervention at that time, so as needed follow-up planned.  Will reach out to surgeon to inquire if any surgical clearances were obtained. No reported chest pain at her PAT RN visit.  ? ?She reported A1c of 6.0 ~ 3 weeks ago. Results requested from her PCP office. ? ?Ortho called in KCl supplement for K 2.9. Albumin 3.4. Dr. Erlinda Hong also wants her to have a prealbumin drawn, 08/22/21 results are pending. Hold ASA x 5 days per surgeon.  ? ? ?ADDENDUM 08/23/21 10:39 AM: ?08/02/21 A1c 6.0%. 08/22/21 prealbumin was normal at 20. Dr. Erlinda Hong did not request surgical clearance, so  reviewed history with anesthesiologist Albertha Ghee, MD. Recommend that given extensive vascular history, other co-morbidities, and at least consideration of future ischemic testing then would advise preoperative cardiology evaluation for elective surgery. Defer decision for additional preoperative testing to cardiology based on their evaluation--had  mild CAD by 04/2019 CCTA and has had mild/mild-moderate AS on previous echocardiograms, most recently mild 12/2020. I have notified Debbie at Dr. Phoebe Sharps office.  ?  ? ?VS: BP (!) 119/55   Pulse 76   Temp 36.4 ?C   Resp 18   Ht 4' 10.5" (1.486 m)   Wt 71.2 kg   LMP 05/07/2005 (Approximate)   SpO2 97%   BMI 32.25 kg/m?  ? ? ?PROVIDERS: ?Buzzy Han, MD is listed as PCP. PAT RN documented as Margretta Sidle, MD ([facility]). ?- [name], MD is vascular surgeon. Last visit 08/24/20.  1 year follow-up with ABIs and carotid US are planned, scheduled for 10/26/21. ? ? ?LABS: Preoperative labs reviewed. See DISCUSSION.  Needs potassium rechecked if not done prior to surgery. She was started on KCL supplement.  ?(all labs ordered are listed, but only abnormal results are displayed) ? ?Labs Reviewed  ?SURGICAL PCR SCREEN - Abnormal; Notable for the following components:  ?    Result Value  ? Staphylococcus aureus POSITIVE (*)   ? All other components within normal limits  ?GLUCOSE, CAPILLARY - Abnormal; Notable for the following components:  ? Glucose-Capillary 103 (*)   ? All other components within normal limits  ?CBC - Abnormal; Notable for the following components:  ?  RBC 3.79 (*)   ? Hemoglobin 11.9 (*)   ? HCT 35.6 (*)   ? RDW 15.9 (*)   ? All other components within normal limits  ?COMPREHENSIVE METABOLIC PANEL - Abnormal; Notable for the following components:  ? Potassium 2.9 (*)   ? Glucose, Bld 117 (*)   ? Albumin 3.4 (*)   ? Alkaline Phosphatase 154 (*)   ? All other components within normal limits  ? ? ?Home Sleep Study  06/11/18: ?IMPRESSION: OSA  ?RECOMMENDATION: This home sleep test demonstrates moderate  ?obstructive sleep apnea with a total AHI of 15.9/hour and O2  ?nadir of 86%. I will prescribe a new autoPAP machine and new  ?supplies. ? ? ?IMAGES: ?1V PCXR 12/27/20: ?FINDINGS: ?Prior median sternotomy. Aortic atherosclerosis. Heart and ?mediastinal contours are within normal limits. No focal opacities or ?effusions. No acute bony abnormality. ?IMPRESSION: ?No active cardiopulmonary disease. ?  ?CT Chest (lung cancer screening) 09/01/20: ?IMPRESSION: ?1. Lung-RADS 2, benign appearance or behavior. Continue annual ?screening with low-dose chest CT without contrast in 12 months. ?2. Aortic atherosclerosis and coronary artery calcifications. ? ? ?EKG: 12/27/20: SR ?- Non-specific ST changes.  ? ? ?CV: ?Echo 12/28/20: ?IMPRESSIONS  ? 1. Left ventricular ejection fraction, by estimation, is 70 to 75%. The  ?left ventricle has hyperdynamic function. The left ventricle has no  ?regional wall motion abnormalities. Left ventricular diastolic parameters  ?are indeterminate.  ? 2. Right ventricular systolic function is normal. The right ventricular  ?size is normal. There is normal pulmonary artery systolic pressure.  ? 3. The mitral valve is normal in structure. No evidence of mitral valve  ?regurgitation. No evidence of mitral stenosis.  ? 4. The aortic valve is calcified. Aortic valve regurgitation is not  ?visualized. Mild aortic valve stenosis.  ?AV VTI:            0.541 m  ?AV Peak Grad:      20.6 mmHg  ?AV Mean Grad:      11.8 mmHg  ?- Comparison echo : LVEF 70-75%, mild basla septal hypertrophy, no RWMA, grade I DD, mild-moderate AS, AV mean gradient 14.7 mmHg. AV peak gradient 29.3 mmHg. AVA by VTI 1.29 cm? ? ? ?CT Coronary 04/17/19 (ordered by CT surgeon Gilford Raid, MD prior to innominate artery bypass): ?IMPRESSION: ?1. Coronary calcium score of 140. This was 53 percentile for age and ?sex matched control. ?2. Normal coronary  origin with left dominance. ?3. CAD-RADS 2. Mild non-obstructive CAD (25-49%) in the proximal ?LAD. Consider preventive therapy and risk factor modification. The LCX had only luminal irregularities. RCA is a small non-dominant artery that has mild calcified plaque with stenosis 0-25%. ? ? ?Past Medical History:  ?Diagnosis Date  ? Aortic stenosis   ? mild-moderate AS 04/22/19 echo  ? Aortoiliac occlusive disease ([place]) 12/03/2016  ? Arthritis   ? Diabetes mellitus without complication ([place])   ? per pt she is pre- diabetic  ? Diverticulitis   ? Heart murmur   ? Hypertension   ? Osteoporosis   ? Sleep apnea   ? Tobacco use disorder 07/04/2012  ? ? ?Past Surgical History:  ?Procedure Laterality Date  ? ABDOMINAL AORTOGRAM W/LOWER EXTREMITY N/A 12/03/2016  ? Procedure: Abdominal Aortogram w/Lower Extremity;  Surgeon: Angelia Mould, MD;  Location: [facility];  Service: Cardiovascular;  Laterality: N/A;  ? AORTA -INNOMIATE BYPASS N/A 05/18/2019  ? Procedure: AORTA -INNOMIATE BYPASS Using Hemashield Gold Graft Size 71m;  Surgeon: BGaye Pollack MD;  [facility];  Service: Open Heart Surgery;  Laterality: N/A;  ? DILATATION & CURETTAGE/HYSTEROSCOPY WITH MYOSURE N/A 12/27/2015  ? Procedure: DILATATION & CURETTAGE/HYSTEROSCOPY;  Surgeon: Nunzio Cobbs, MD;  Location: [facility];  Service: Gynecology;  Laterality: N/A;  ? DILATION AND CURETTAGE OF UTERUS  11/2015  ? ELBOW SURGERY Left ~2007  ? tendon repair by Dr. Veverly Fells  ? EYE SURGERY Bilateral 2008  ? lasik  ? JOINT REPLACEMENT    ? lower aortic bypass  2000  ? per patient  ? PERIPHERAL VASCULAR INTERVENTION  12/03/2016  ? Procedure: Peripheral Vascular Intervention;  Surgeon: Angelia Mould, MD;  Location: [facility];  Service: Cardiovascular;;  ? STERNOTOMY  05/18/2019  ? Procedure: Sternotomy;  Surgeon: Gaye Pollack, MD;  Location: [facility] OR;  Service: Open Heart Surgery;;  ? TOTAL HIP ARTHROPLASTY Right 07/27/2014  ? Procedure: RIGHT  TOTAL HIP ARTHROPLASTY ANTERIOR APPROACH;  Surgeon: Paralee Cancel, MD;  Location: [facility];  Service: Orthopedics;  Laterality: Right;  ? ? ?MEDICATIONS: ? acetaminophen (TYLENOL) 500 MG tablet  ? allopurinol (ZYLOPRIM

## 2021-08-23 ENCOUNTER — Telehealth: Payer: Self-pay | Admitting: Orthopaedic Surgery

## 2021-08-23 LAB — EXTRA LAV TOP TUBE

## 2021-08-23 LAB — PREALBUMIN: Prealbumin: 20 mg/dL (ref 17–34)

## 2021-08-23 NOTE — Telephone Encounter (Signed)
Patient is scheduled for right total hip with Dr. Erlinda Hong 08-29-21.  I spoke with patient regarding a pre-op clearance being requested from anesthesia.   She was admitted for chest pain 12/2020.  She has mild aortic stenosis and has had several major vascular surgeries. August of  2022--Dr. Hilty wrote "this is a 66 year old female with a history of PAD and prior interventions as well as hypertension, dyslipidemia, type 2 diabetes, prior tobacco use and aortic stenosis which is mild.  She is ruled out for MI and had no ischemic EKG changes.  She is currently asymptomatic and is eager to go home.  We discussed the possibility of outpatient stress testing as her coronary disease could have possibly progressed however she declined further interventions.  Follow-up can be on an as-needed basis." ? ?Patient does not wish to put her surgery off and would like to take any pre-op appointment made available to her within the next 2 days.  A request for clearance was faxed to Dr [facility] office today.  Patient will wait to speak with a member of the pre-op team.  ? ? ?

## 2021-08-24 ENCOUNTER — Telehealth: Payer: Self-pay

## 2021-08-24 NOTE — Telephone Encounter (Addendum)
? ?  Name: Diane Giles  ?DOB: [birth year]  ?MRN: [id number] ? ?Primary Cardiologist: Dr. Rennis Golden in hospital 12/2020 ? ?Chart reviewed as part of pre-operative protocol coverage. Because of [name]'s past medical history and time since last visit, she will require a follow-up in-office visit in order to better assess preoperative cardiovascular risk. ? ?Surgery listed as 08/24/21 on [facility] intake but 08/28/21 on Epic. ? ?Patient was only seen once in the hospital for consultation with consideration of outpatient stress testing which she declined at that time, recommended to follow-up PRN in that case. However, given need for surgical clearance, needs OV. ? ?Pre-op covering staff: ?- Please schedule appointment and call patient to inform them. ?- Please contact requesting surgeon's office via preferred method (i.e, phone, fax) to inform them of need for appointment prior to surgery. ? ?Clearance does not request any med holds. ? ?Laurann Montana, PA-C  ?08/24/2021, 5:50 PM  ? ?

## 2021-08-24 NOTE — Telephone Encounter (Signed)
? ?  Pre-operative Risk Assessment  ?  ?Patient Name: Diane Giles  ?DOB: [birth year] ?MRN: [id number]  ? ?  ? ?Request for Surgical Clearance   ? ?Procedure:  Right Total Hip ? ?Date of Surgery:  Clearance 08/28/21                              ?   ?Surgeon:  Dr. Glee Arvin ?Surgeon's Group or Practice Name:  [facility] ?Phone number:  [phone] ?Fax number:  [phone] ?  ?Type of Clearance Requested:   ?- Medical  ?  ?Type of Anesthesia:  Spinal ?  ?Additional requests/questions:   ? ?Signed, ?Thane Edu N   ?08/24/2021, 4:25 PM  ? ?

## 2021-08-25 NOTE — Telephone Encounter (Signed)
I called the pt to advise she will need an appt for pre op clearance. When I stated about the appt, the pt said there is no reason she needs a cardiologist and to see the notes from the hospital visit last year where our team was ask to consult on the pt. I did tell the pt that I did read the notes from that hospital visit. I explained that the surgeon is asking for cardiac clearance and that we are trying to abide by that. I explained to her that we take our role very seriously.  ? ?Pt said if we cannot sign off on the clearance from the notes from last year hospital visit then she will find her own cardiologist. I stated to the pt that she cannot hold this on our office as we were just made aware yesterday of the surgery for Monday 08/28/21. Pt again said she will find her own cardiologist in her team. I asked did that mean she is seeing another cardiology practice, if so I can let the surgeon know to contact that office. Pt said no she will find her own. I said that is fine and that is her choice.  ? ?I will be sure to update the surgeon office as of my call today with the pt.  ?

## 2021-08-28 ENCOUNTER — Ambulatory Visit (HOSPITAL_COMMUNITY): Admission: RE | Admit: 2021-08-28 | Payer: Medicare Other | Source: Home / Self Care | Admitting: Orthopaedic Surgery

## 2021-08-28 DIAGNOSIS — M1711 Unilateral primary osteoarthritis, right knee: Secondary | ICD-10-CM

## 2021-09-05 ENCOUNTER — Ambulatory Visit (INDEPENDENT_AMBULATORY_CARE_PROVIDER_SITE_OTHER): Payer: Medicare Other | Admitting: Orthopaedic Surgery

## 2021-09-05 ENCOUNTER — Encounter: Payer: Self-pay | Admitting: Orthopaedic Surgery

## 2021-09-05 DIAGNOSIS — M1711 Unilateral primary osteoarthritis, right knee: Secondary | ICD-10-CM | POA: Diagnosis not present

## 2021-09-05 NOTE — Progress Notes (Signed)
? ?Office Visit Note ?  ?Patient: Diane Giles           ?Date of Birth: 1955/08/07           ?MRN: [id number] ?Visit Date: 09/05/2021 ?             ?Requested by: Margot Ables, MD ?[phone] ?[place],  [place] [address] ?PCP: Margot Ables, MD ? ? ?Assessment & Plan: ?Visit Diagnoses:  ?1. Primary osteoarthritis of right knee   ? ? ?Plan: I explained to Diane Giles what happens with cardiac clearance and with anesthesia due to her cardiac history.  She requested a referral to Dr. Jacinto Halim to establish care and to obtain preoperative cardiac clearance.  She has a appointment with Dr. Edilia Bo in June as well.  She will also consider doing a cortisone injection to tide her over.  She is looking at having the knee replacement in October. ? ?Follow-Up Instructions: No follow-ups on file.  ? ?Orders:  ?No orders of the defined types were placed in this encounter. ? ?No orders of the defined types were placed in this encounter. ? ? ? ? Procedures: ?No procedures performed ? ? ?Clinical Data: ?No additional findings. ? ? ?Subjective: ?Chief Complaint  ?Patient presents with  ? Right Knee - Pain  ? ? ?HPI ? ?Diane Giles returns today for follow-up of right knee pain.  Her knee replacement surgery was canceled due to cardiac clearance that was required by anesthesia. ? ?Review of Systems ? ? ?Objective: ?Vital Signs: LMP 05/07/2005 (Approximate)  ? ?Physical Exam ? ?Ortho Exam ? ?Examination of right knee is unchanged. ? ?Specialty Comments:  ?No specialty comments available. ? ?Imaging: ?No results found. ? ? ?PMFS History: ?Patient Active Problem List  ? Diagnosis Date Noted  ? Primary osteoarthritis of right knee 06/20/2021  ? PAD (peripheral artery disease) (HCC)   ? Chest pain 12/27/2020  ? 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  ? Aortoiliac occlusive disease (HCC) 12/03/2016  ? Centrilobular emphysema (HCC) 10/03/2016  ? Right groin pain 02/27/2016  ? Sacroiliitis (HCC)  02/27/2016  ? Postmenopausal bleeding 12/01/2015  ? Psoas tendinitis of right side 10/21/2015  ? Endometrial thickening on ultra sound 07/28/2015  ? S/P right THA, 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  ? Type 2 diabetes mellitus (HCC) 05/18/2014  ? DDD (degenerative disc disease) 03/18/2013  ? Metabolic syndrome 07/29/2012  ? Essential hypertension, benign 07/18/2012  ? Hyperlipidemia 07/18/2012  ? Obstructive sleep apnea 07/04/2012  ? Osteoporosis 07/04/2012  ? Morbid obesity (HCC) 07/04/2012  ? ?Past Medical History:  ?Diagnosis Date  ? Aortic stenosis   ? mild-moderate AS 04/22/19 echo  ? Aortoiliac occlusive disease (HCC) 12/03/2016  ? Arthritis   ? Diabetes mellitus without complication (HCC)   ? per pt she is pre- diabetic  ? Diverticulitis   ? Heart murmur   ? Hypertension   ? Osteoporosis   ? Sleep apnea   ? Tobacco use disorder 07/04/2012  ?  ?Family History  ?Problem Relation Age of Onset  ? Cancer Father 49  ?     Dec age 18 with pancreatic Ca  ? Migraines Mother   ? Thyroid disease Mother   ?     hypothyroid  ?  ?Past Surgical History:  ?Procedure Laterality Date  ? ABDOMINAL AORTOGRAM W/LOWER EXTREMITY N/A 12/03/2016  ? Procedure: Abdominal Aortogram w/Lower Extremity;  Surgeon: Chuck Hint, MD;  Location: [facility] INVASIVE [facility];  Service: Cardiovascular;  Laterality: N/A;  ? AORTA -INNOMIATE  BYPASS N/A 05/18/2019  ? Procedure: AORTA -INNOMIATE BYPASS Using Hemashield Gold Graft Size 8mm;  Surgeon: Alleen Borne, MD;  [facility];  Service: Open Heart Surgery;  Laterality: N/A;  ? DILATATION & CURETTAGE/HYSTEROSCOPY WITH MYOSURE N/A 12/27/2015  ? Procedure: DILATATION & CURETTAGE/HYSTEROSCOPY;  Surgeon: Patton Salles, MD;  Location: [facility];  Service: Gynecology;  Laterality: N/A;  ? DILATION AND CURETTAGE OF UTERUS  11/2015  ? ELBOW SURGERY Left ~2007  ? tendon repair by Dr. Ranell Patrick  ? EYE SURGERY Bilateral 2008  ? lasik  ? JOINT REPLACEMENT    ? lower aortic bypass  2000  ? per patient  ? PERIPHERAL  VASCULAR INTERVENTION  12/03/2016  ? Procedure: Peripheral Vascular Intervention;  Surgeon: Chuck Hint, MD;  Location: [facility];  Service: Cardiovascular;;  ? STERNOTOMY  05/18/2019  ? Procedure: Sternotomy;  Surgeon: Alleen Borne, MD;  Location: [facility] OR;  Service: Open Heart Surgery;;  ? TOTAL HIP ARTHROPLASTY Right 07/27/2014  ? Procedure: RIGHT TOTAL HIP ARTHROPLASTY ANTERIOR APPROACH;  Surgeon: Durene Romans, MD;  Location: [facility];  Service: Orthopedics;  Laterality: Right;  ? ?Social History  ? ?Occupational History  ? Occupation: Tree surgeon  ?Tobacco Use  ? Smoking status: Former  ?  Years: 40.00  ?  Types: Cigarettes  ?  Quit date: 02/23/2017  ?  Years since quitting: 4.5  ? Smokeless tobacco: Never  ?Vaping Use  ? Vaping Use: Never used  ?Substance and Sexual Activity  ? Alcohol use: Yes  ?  Alcohol/week: 0.0 standard drinks  ?  Comment: very rare--1/month  ? Drug use: No  ? Sexual activity: Yes  ?  Partners: Male  ?  Birth control/protection: Post-menopausal  ? ? ? ? ? ? ?

## 2021-09-12 ENCOUNTER — Encounter: Payer: Medicare Other | Admitting: Orthopaedic Surgery

## 2021-09-15 ENCOUNTER — Encounter: Payer: Self-pay | Admitting: Orthopaedic Surgery

## 2021-09-15 ENCOUNTER — Ambulatory Visit (INDEPENDENT_AMBULATORY_CARE_PROVIDER_SITE_OTHER): Payer: Medicare Other | Admitting: Orthopaedic Surgery

## 2021-09-15 DIAGNOSIS — M1711 Unilateral primary osteoarthritis, right knee: Secondary | ICD-10-CM

## 2021-09-15 MED ORDER — METHYLPREDNISOLONE ACETATE 40 MG/ML IJ SUSP
40.0000 mg | INTRAMUSCULAR | Status: AC | PRN
  Administered 2021-09-15: 40 mg via INTRA_ARTICULAR

## 2021-09-15 MED ORDER — BUPIVACAINE HCL 0.5 % IJ SOLN
2.0000 mL | INTRAMUSCULAR | Status: AC | PRN
  Administered 2021-09-15: 2 mL via INTRA_ARTICULAR

## 2021-09-15 MED ORDER — LIDOCAINE HCL 1 % IJ SOLN
2.0000 mL | INTRAMUSCULAR | Status: AC | PRN
  Administered 2021-09-15: 2 mL

## 2021-09-15 NOTE — Progress Notes (Signed)
? ?Office Visit Note ?  ?Patient: Diane Giles           ?Date of Birth: [birth year]           ?MRN: [id number] ?Visit Date: 09/15/2021 ?             ?Requested by: No referring provider defined for this encounter. ?PCP: Margot Ables, MD (Inactive) ? ? ?Assessment & Plan: ?Visit Diagnoses:  ?1. Primary osteoarthritis of right knee   ? ? ?Plan: Impression is right knee OA and DJD injected with cortisone today.  She will call Eunice Blase to schedule surgery for October. ? ?Follow-Up Instructions: No follow-ups on file.  ? ?Orders:  ?No orders of the defined types were placed in this encounter. ? ?No orders of the defined types were placed in this encounter. ? ? ? ? Procedures: ?Large Joint Inj: R knee on 09/15/2021 3:37 PM ?Indications: pain ?Details: 22 G needle ? ?Arthrogram: No ? ?Medications: 40 mg methylPREDNISolone acetate 40 MG/ML; 2 mL lidocaine 1 %; 2 mL bupivacaine 0.5 % ?Consent was given by the patient. Patient was prepped and draped in the usual sterile fashion.  ? ? ? ? ?Clinical Data: ?No additional findings. ? ? ?Subjective: ?Chief Complaint  ?Patient presents with  ? Right Knee - Pain  ? ? ?HPI ? ?Diane Giles is here for right knee OA and DJD.  Would like to get a cortisone injection today.  She is looking at getting knee replacement surgery in October. ? ?Review of Systems ? ? ?Objective: ?Vital Signs: LMP 05/07/2005 (Approximate)  ? ?Physical Exam ? ?Ortho Exam ? ?Right knee exam is unchanged. ? ?Specialty Comments:  ?No specialty comments available. ? ?Imaging: ?No results found. ? ? ?PMFS History: ?Patient Active Problem List  ? Diagnosis Date Noted  ? Primary osteoarthritis of right knee 06/20/2021  ? PAD (peripheral artery disease) (HCC)   ? Chest pain 12/27/2020  ? 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  ? Aortoiliac occlusive disease (HCC) 12/03/2016  ? Centrilobular emphysema (HCC) 10/03/2016  ? Right groin pain 02/27/2016  ? Sacroiliitis (HCC) 02/27/2016  ?  Postmenopausal bleeding 12/01/2015  ? Psoas tendinitis of right side 10/21/2015  ? Endometrial thickening on ultra sound 07/28/2015  ? S/P right THA, 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  ? Type 2 diabetes mellitus (HCC) 05/18/2014  ? DDD (degenerative disc disease) 03/18/2013  ? Metabolic syndrome 07/29/2012  ? Essential hypertension, benign 07/18/2012  ? Hyperlipidemia 07/18/2012  ? Obstructive sleep apnea 07/04/2012  ? Osteoporosis 07/04/2012  ? Morbid obesity (HCC) 07/04/2012  ? ?Past Medical History:  ?Diagnosis Date  ? Aortic stenosis   ? mild-moderate AS 04/22/19 echo  ? Aortoiliac occlusive disease (HCC) 12/03/2016  ? Arthritis   ? Diabetes mellitus without complication ([facility]CC)   ? per pt she is pre- diabetic  ? Diverticulitis   ? Heart murmur   ? Hypertension   ? Osteoporosis   ? Sleep apnea   ? Tobacco use disorder 07/04/2012  ?  ?Family History  ?Problem Relation Age of Onset  ? Cancer Father 21  ?     Dec age 2 with pancreatic Ca  ? Migraines Mother   ? Thyroid disease Mother   ?     hypothyroid  ?  ?Past Surgical History:  ?Procedure Laterality Date  ? ABDOMINAL AORTOGRAM W/LOWER EXTREMITY N/A 12/03/2016  ? Procedure: Abdominal Aortogram w/Lower Extremity;  Surgeon: Chuck Hint, MD;  Location: [facility] INVASIVE [facility] LAB;  Service: Cardiovascular;  Laterality: N/A;  ?  AORTA -INNOMIATE BYPASS N/A 05/18/2019  ? Procedure: AORTA -INNOMIATE BYPASS Using Hemashield Gold Graft Size 8mm;  Surgeon: Alleen Borne, MD;  [facility]: [facility];  Service: Open Heart Surgery;  Laterality: N/A;  ? DILATATION & CURETTAGE/HYSTEROSCOPY WITH MYOSURE N/A 12/27/2015  ? Procedure: DILATATION & CURETTAGE/HYSTEROSCOPY;  Surgeon: Patton Salles, MD;  Location: [facility];  Service: Gynecology;  Laterality: N/A;  ? DILATION AND CURETTAGE OF UTERUS  11/2015  ? ELBOW SURGERY Left ~2007  ? tendon repair by Dr. Ranell Patrick  ? EYE SURGERY Bilateral 2008  ? lasik  ? JOINT REPLACEMENT    ? lower aortic bypass  2000  ? per patient  ? PERIPHERAL VASCULAR INTERVENTION   12/03/2016  ? Procedure: Peripheral Vascular Intervention;  Surgeon: Chuck Hint, MD;  Location: [facility] INVASIVE CV LAB;  Service: Cardiovascular;;  ? STERNOTOMY  05/18/2019  ? Procedure: Sternotomy;  Surgeon: Alleen Borne, MD;  Location: [facility] OR;  Service: Open Heart Surgery;;  ? TOTAL HIP ARTHROPLASTY Right 07/27/2014  ? Procedure: RIGHT TOTAL HIP ARTHROPLASTY ANTERIOR APPROACH;  Surgeon: Durene Romans, MD;  Location: [facility];  Service: Orthopedics;  Laterality: Right;  ? ?Social History  ? ?Occupational History  ? Occupation: Tree surgeon  ?Tobacco Use  ? Smoking status: Former  ?  Years: 40.00  ?  Types: Cigarettes  ?  Quit date: 02/23/2017  ?  Years since quitting: 4.5  ? Smokeless tobacco: Never  ?Vaping Use  ? Vaping Use: Never used  ?Substance and Sexual Activity  ? Alcohol use: Yes  ?  Alcohol/week: 0.0 standard drinks  ?  Comment: very rare--1/month  ? Drug use: No  ? Sexual activity: Yes  ?  Partners: Male  ?  Birth control/protection: Post-menopausal  ? ? ? ? ? ? ?

## 2021-09-27 ENCOUNTER — Other Ambulatory Visit: Payer: Self-pay | Admitting: *Deleted

## 2021-09-27 DIAGNOSIS — Z87891 Personal history of nicotine dependence: Secondary | ICD-10-CM

## 2021-10-09 ENCOUNTER — Ambulatory Visit: Payer: Medicare Other | Admitting: Podiatry

## 2021-10-11 ENCOUNTER — Ambulatory Visit
Admission: RE | Admit: 2021-10-11 | Discharge: 2021-10-11 | Disposition: A | Discharge status: Home / Self Care | Payer: Medicare Other | Source: Ambulatory Visit | Attending: Acute Care | Admitting: Acute Care

## 2021-10-11 DIAGNOSIS — Z87891 Personal history of nicotine dependence: Secondary | ICD-10-CM

## 2021-10-13 ENCOUNTER — Other Ambulatory Visit: Payer: Self-pay | Admitting: Acute Care

## 2021-10-13 DIAGNOSIS — Z122 Encounter for screening for malignant neoplasm of respiratory organs: Secondary | ICD-10-CM

## 2021-10-13 DIAGNOSIS — Z87891 Personal history of nicotine dependence: Secondary | ICD-10-CM

## 2021-10-17 ENCOUNTER — Other Ambulatory Visit: Payer: Self-pay | Admitting: *Deleted

## 2021-10-17 DIAGNOSIS — I771 Stricture of artery: Secondary | ICD-10-CM

## 2021-10-17 DIAGNOSIS — I6522 Occlusion and stenosis of left carotid artery: Secondary | ICD-10-CM

## 2021-10-17 DIAGNOSIS — I7409 Other arterial embolism and thrombosis of abdominal aorta: Secondary | ICD-10-CM

## 2021-10-26 ENCOUNTER — Ambulatory Visit (INDEPENDENT_AMBULATORY_CARE_PROVIDER_SITE_OTHER)
Admission: RE | Admit: 2021-10-26 | Discharge: 2021-10-26 | Disposition: A | Discharge status: Home / Self Care | Payer: Medicare Other | Source: Ambulatory Visit | Attending: Vascular Surgery | Admitting: Vascular Surgery

## 2021-10-26 ENCOUNTER — Encounter: Payer: Self-pay | Admitting: Vascular Surgery

## 2021-10-26 ENCOUNTER — Ambulatory Visit (INDEPENDENT_AMBULATORY_CARE_PROVIDER_SITE_OTHER): Payer: Medicare Other | Admitting: Vascular Surgery

## 2021-10-26 VITALS — BP 130/52 | HR 87 | Temp 98.0°F | Resp 20 | Ht 58.5 in | Wt 159.0 lb

## 2021-10-26 DIAGNOSIS — K5651 Intestinal adhesions [bands], with partial obstruction: Secondary | ICD-10-CM | POA: Diagnosis not present

## 2021-10-26 DIAGNOSIS — I771 Stricture of artery: Secondary | ICD-10-CM | POA: Insufficient documentation

## 2021-10-26 DIAGNOSIS — I7409 Other arterial embolism and thrombosis of abdominal aorta: Secondary | ICD-10-CM | POA: Insufficient documentation

## 2021-10-26 DIAGNOSIS — I6522 Occlusion and stenosis of left carotid artery: Secondary | ICD-10-CM

## 2021-10-26 DIAGNOSIS — R109 Unspecified abdominal pain: Secondary | ICD-10-CM | POA: Diagnosis not present

## 2021-10-26 DIAGNOSIS — Z48812 Encounter for surgical aftercare following surgery on the circulatory system: Secondary | ICD-10-CM | POA: Diagnosis not present

## 2021-10-26 NOTE — Progress Notes (Signed)
REASON FOR VISIT:   Follow-up of peripheral arterial disease  MEDICAL ISSUES:   PERIPHERAL ARTERIAL DISEASE: This patient is undergone previous aortobiiliac bypass grafting and has normal ABIs.  She has no claudication or rest pain.  I encouraged her to stay as active as possible.  She quit smoking in 2018.  She is on aspirin and is on a statin.  S/P AORTO INNOMINATE BYPASS: Her bypass graft is patent with normal hemodynamics in the right subclavian artery and a palpable radial pulse.  She has no symptoms.  She has no evidence of carotid disease on carotid duplex so I do not think she needs a carotid duplex scan at her next visit.  She is on aspirin and a statin.  HPI:   Diane Giles is a pleasant 66 y.o. female who underwent an aortobiiliac bypass graft in 2000.  She subsequently developed some stenosis in the proximal right limb of her graft and underwent angioplasty and stenting of this with a VBX stent in July 2018.  More recently she had presented with paresthesias in her right arm and right arm weakness.  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  In January 2021 she underwent an aorto innominate bypass with an 8 mm Dacron graft.  I last saw her on 08/24/2020.  When I saw her last her graft was patent.  She did get some weakness in her legs with sitting but I did not think this was related to peripheral arterial disease.  I thought it was more likely neurogenic in origin.  I recommended follow-up ABIs in 1 year.  When I saw her last she had a palpable right radial pulse suggesting that her aorto innominate bypass graft was patent.  I recommended a follow-up carotid duplex scan in 1 year.  Since I saw her last she denies any claudication or rest pain.  She experiences some weakness in her legs after she has been sitting and stands up.  She does not experience the  symptoms with ambulation so I do not think this represents symptoms from underlying peripheral vascular disease.  She denies any back pain although certainly the symptoms could be neurogenic in origin.  She also has arthritis in both knees and is scheduled for a right knee replacement in this fall and subsequent wants to have both knees replaced.  Perhaps her symptoms are related to this also.  She quit smoking in 2018.  She is on aspirin and is on statin  Past Medical History:  Diagnosis Date   Aortic stenosis    mild-moderate AS 04/22/19 echo   Aortoiliac occlusive disease ([place]) 12/03/2016   Arthritis    Diabetes mellitus without complication ([place])    per pt she is pre- diabetic   Diverticulitis    Heart murmur    Hypertension    Osteoporosis    Sleep apnea    Tobacco use disorder 07/04/2012    Family History  Problem Relation Age of Onset   Cancer Father 61       Dec age 68 with pancreatic Ca   Migraines Mother    Thyroid disease Mother        hypothyroid    SOCIAL HISTORY: Social History   Tobacco Use   Smoking status: Former    Years: 40.00    Types: Cigarettes    Quit date: 02/23/2017  Years since quitting: 4.6   Smokeless tobacco: Never  Substance Use Topics   Alcohol use: Yes    Alcohol/week: 0.0 standard drinks of alcohol    Comment: very rare--1/month    Allergies  Allergen Reactions   Codeine Itching    Current Outpatient Medications  Medication Sig Dispense Refill   acetaminophen (TYLENOL) 500 MG tablet Take 1,000 mg by mouth every 6 (six) hours as needed for mild pain.     allopurinol (ZYLOPRIM) 100 MG tablet Take 300 mg by mouth daily.     aspirin EC 81 MG tablet Take 1 tablet (81 mg total) by mouth 2 (two) times daily. To be taken after surgery to prevent blood clots 84 tablet 0   atenolol-chlorthalidone (TENORETIC) 50-25 MG tablet TAKE 1 TABLET IN THE MORNING. (Patient taking differently: Take 1 tablet by mouth daily.) 30 tablet 0    atorvastatin (LIPITOR) 80 MG tablet Take 1 tablet (80 mg total) by mouth daily at 6 PM. 90 tablet 3   clobetasol cream (TEMOVATE) 8.75 % Apply 1 application topically 2 (two) times daily as needed (rash).     colchicine 0.6 MG tablet Take 1.'2mg'$  (2 tablets) then 0.'6mg'$  (1 tablet) 1 hour after. Then, take 1 tablet every day for 7 days. (Patient taking differently: Take 0.6-1.2 mg by mouth See admin instructions. Take 1.'2mg'$  (2 tablets) then 0.'6mg'$  (1 tablet) 1 hour after. Then, take 1 tablet every day for 7 days. AS NEEDED) 10 tablet 2   furosemide (LASIX) 20 MG tablet Take 1 tablet (20 mg total) by mouth every morning. 90 tablet 1   Lancets MISC Use as directed once a day. (DX: R73.9) 100 each 2   losartan (COZAAR) 25 MG tablet TAKE 1 TABLET IN THE P.M. (Patient taking differently: Take 12.5 mg by mouth every evening.) 90 tablet 3   metFORMIN (GLUCOPHAGE) 500 MG tablet TAKE 2 TABLETS TWICE DAILY WITH MEALS. (Patient taking differently: Take 500 mg by mouth 2 (two) times daily with a meal.) 360 tablet 3   OZEMPIC, 0.25 OR 0.5 MG/DOSE, 2 MG/3ML SOPN Inject 0.5 mg into the skin every Monday.     potassium chloride (KLOR-CON M) 10 MEQ tablet Take 4 pills twice daily x 4 days 32 tablet 0   pregabalin (LYRICA) 75 MG capsule Take 75 mg by mouth 2 (two) times daily.     No current facility-administered medications for this visit.    REVIEW OF SYSTEMS:  '[X]'$  denotes positive finding, '[ ]'$  denotes negative finding Cardiac  Comments:  Chest pain or chest pressure:    Shortness of breath upon exertion:    Short of breath when lying flat:    Irregular heart rhythm:        Vascular    Pain in calf, thigh, or hip brought on by ambulation:    Pain in feet at night that wakes you up from your sleep:     Blood clot in your veins:    Leg swelling:         Pulmonary    Oxygen at home:    Productive cough:     Wheezing:         Neurologic    Sudden weakness in arms or legs:     Sudden numbness in arms or  legs:     Sudden onset of difficulty speaking or slurred speech:    Temporary loss of vision in one eye:     Problems with dizziness:  Gastrointestinal    Blood in stool:     Vomited blood:         Genitourinary    Burning when urinating:     Blood in urine:        Psychiatric    Major depression:         Hematologic    Bleeding problems:    Problems with blood clotting too easily:        Skin    Rashes or ulcers:        Constitutional    Fever or chills:     PHYSICAL EXAM:   Vitals:   10/26/21 1118 10/26/21 1120  BP: (!) 148/64 (!) 130/52  Pulse: 87   Resp: 20   Temp: 98 F (36.7 C)   SpO2: 95%   Weight: 159 lb (72.1 kg)   Height: 4' 10.5" (1.486 m)     GENERAL: The patient is a well-nourished female, in no acute distress. The vital signs are documented above. CARDIAC: There is a regular rate and rhythm.  VASCULAR: She has a left carotid bruit. She has palpable femoral pulses and palpable posterior tibial pulses bilaterally. PULMONARY: There is good air exchange bilaterally without wheezing or rales. ABDOMEN: Soft and non-tender with normal pitched bowel sounds.  MUSCULOSKELETAL: There are no major deformities or cyanosis. NEUROLOGIC: No focal weakness or paresthesias are detected. SKIN: There are no ulcers or rashes noted. PSYCHIATRIC: The patient has a normal affect.  DATA:    CAROTID DUPLEX: I have independently interpreted her carotid duplex scan today.  On the right side there is a less than 39% stenosis.  The right vertebral artery is patent with antegrade flow.  There are normal flow hemodynamics in the subclavian artery on the right.  On the left side there is less than 39% stenosis.  The left vertebral artery is patent with antegrade flow.  There are normal flow hemodynamics in the left subclavian artery.  ARTERIAL DOPPLER STUDY: I have independently interpreted her arterial Doppler study today.  On the right side there is a triphasic  posterior tibial signal with a biphasic dorsalis pedis signal.  ABIs 100%.  Toe pressures 117 mmHg.  On the left side there is a triphasic dorsalis pedis and posterior tibial signal.  ABI is 100%.  Toe pressure is 95 mmHg.   [name] [facility] [phone]

## 2021-10-28 ENCOUNTER — Inpatient Hospital Stay (HOSPITAL_BASED_OUTPATIENT_CLINIC_OR_DEPARTMENT_OTHER)
Admission: EM | Admit: 2021-10-28 | Discharge: 2021-10-31 | DRG: 390 | Disposition: A | Discharge status: Home / Self Care | Payer: Medicare Other | Attending: Internal Medicine | Admitting: Internal Medicine

## 2021-10-28 ENCOUNTER — Other Ambulatory Visit: Payer: Self-pay

## 2021-10-28 ENCOUNTER — Encounter (HOSPITAL_BASED_OUTPATIENT_CLINIC_OR_DEPARTMENT_OTHER): Payer: Self-pay | Admitting: Emergency Medicine

## 2021-10-28 ENCOUNTER — Emergency Department (HOSPITAL_BASED_OUTPATIENT_CLINIC_OR_DEPARTMENT_OTHER): Payer: Medicare Other

## 2021-10-28 DIAGNOSIS — K56600 Partial intestinal obstruction, unspecified as to cause: Principal | ICD-10-CM | POA: Diagnosis present

## 2021-10-28 DIAGNOSIS — D638 Anemia in other chronic diseases classified elsewhere: Secondary | ICD-10-CM | POA: Diagnosis present

## 2021-10-28 DIAGNOSIS — K56609 Unspecified intestinal obstruction, unspecified as to partial versus complete obstruction: Secondary | ICD-10-CM

## 2021-10-28 DIAGNOSIS — E1165 Type 2 diabetes mellitus with hyperglycemia: Secondary | ICD-10-CM | POA: Diagnosis not present

## 2021-10-28 DIAGNOSIS — Z79899 Other long term (current) drug therapy: Secondary | ICD-10-CM | POA: Diagnosis not present

## 2021-10-28 DIAGNOSIS — K5651 Intestinal adhesions [bands], with partial obstruction: Secondary | ICD-10-CM | POA: Diagnosis not present

## 2021-10-28 DIAGNOSIS — M810 Age-related osteoporosis without current pathological fracture: Secondary | ICD-10-CM | POA: Diagnosis present

## 2021-10-28 DIAGNOSIS — Z7982 Long term (current) use of aspirin: Secondary | ICD-10-CM

## 2021-10-28 DIAGNOSIS — Z8349 Family history of other endocrine, nutritional and metabolic diseases: Secondary | ICD-10-CM | POA: Diagnosis not present

## 2021-10-28 DIAGNOSIS — G4730 Sleep apnea, unspecified: Secondary | ICD-10-CM | POA: Diagnosis not present

## 2021-10-28 DIAGNOSIS — E119 Type 2 diabetes mellitus without complications: Secondary | ICD-10-CM

## 2021-10-28 DIAGNOSIS — Z885 Allergy status to narcotic agent status: Secondary | ICD-10-CM | POA: Diagnosis not present

## 2021-10-28 DIAGNOSIS — Z7985 Long-term (current) use of injectable non-insulin antidiabetic drugs: Secondary | ICD-10-CM

## 2021-10-28 DIAGNOSIS — E538 Deficiency of other specified B group vitamins: Secondary | ICD-10-CM | POA: Diagnosis present

## 2021-10-28 DIAGNOSIS — Z96641 Presence of right artificial hip joint: Secondary | ICD-10-CM | POA: Diagnosis present

## 2021-10-28 DIAGNOSIS — I350 Nonrheumatic aortic (valve) stenosis: Secondary | ICD-10-CM | POA: Diagnosis not present

## 2021-10-28 DIAGNOSIS — I10 Essential (primary) hypertension: Secondary | ICD-10-CM | POA: Diagnosis present

## 2021-10-28 DIAGNOSIS — Z87891 Personal history of nicotine dependence: Secondary | ICD-10-CM | POA: Diagnosis not present

## 2021-10-28 DIAGNOSIS — I739 Peripheral vascular disease, unspecified: Secondary | ICD-10-CM | POA: Diagnosis not present

## 2021-10-28 DIAGNOSIS — Z800 Family history of malignant neoplasm of digestive organs: Secondary | ICD-10-CM | POA: Diagnosis not present

## 2021-10-28 DIAGNOSIS — Z7984 Long term (current) use of oral hypoglycemic drugs: Secondary | ICD-10-CM

## 2021-10-28 DIAGNOSIS — M109 Gout, unspecified: Secondary | ICD-10-CM | POA: Diagnosis not present

## 2021-10-28 DIAGNOSIS — M1990 Unspecified osteoarthritis, unspecified site: Secondary | ICD-10-CM | POA: Diagnosis not present

## 2021-10-28 DIAGNOSIS — R109 Unspecified abdominal pain: Secondary | ICD-10-CM | POA: Diagnosis present

## 2021-10-28 DIAGNOSIS — E1151 Type 2 diabetes mellitus with diabetic peripheral angiopathy without gangrene: Secondary | ICD-10-CM | POA: Diagnosis present

## 2021-10-28 HISTORY — DX: Gout, unspecified: M10.9

## 2021-10-28 LAB — COMPREHENSIVE METABOLIC PANEL
ALT: 13 U/L (ref 0–44)
AST: 13 U/L — ABNORMAL LOW (ref 15–41)
Albumin: 4 g/dL (ref 3.5–5.0)
Alkaline Phosphatase: 131 U/L — ABNORMAL HIGH (ref 38–126)
Anion gap: 11 (ref 5–15)
BUN: 12 mg/dL (ref 8–23)
CO2: 27 mmol/L (ref 22–32)
Calcium: 9.8 mg/dL (ref 8.9–10.3)
Chloride: 102 mmol/L (ref 98–111)
Creatinine, Ser: 0.83 mg/dL (ref 0.44–1.00)
GFR, Estimated: >60 mL/min (ref >60)
Glucose, Bld: 114 mg/dL — ABNORMAL HIGH (ref 70–99)
Potassium: 4 mmol/L (ref 3.5–5.1)
Sodium: 140 mmol/L (ref 135–145)
Total Bilirubin: 0.5 mg/dL (ref 0.3–1.2)
Total Protein: 6.4 g/dL — ABNORMAL LOW (ref 6.5–8.1)

## 2021-10-28 LAB — LIPASE, BLOOD: Lipase: 25 U/L (ref 11–51)

## 2021-10-28 LAB — CBC
HCT: 34.3 % — ABNORMAL LOW (ref 36.0–46.0)
Hemoglobin: 11.3 g/dL — ABNORMAL LOW (ref 12.0–15.0)
MCH: 30.5 pg (ref 26.0–34.0)
MCHC: 32.9 g/dL (ref 30.0–36.0)
MCV: 92.7 fL (ref 80.0–100.0)
Platelets: 304 10*3/uL (ref 150–400)
RBC: 3.7 MIL/uL — ABNORMAL LOW (ref 3.87–5.11)
RDW: 15.1 % (ref 11.5–15.5)
WBC: 12.5 10*3/uL — ABNORMAL HIGH (ref 4.0–10.5)
nRBC: 0 % (ref 0.0–0.2)

## 2021-10-28 MED ORDER — MORPHINE SULFATE (PF) 4 MG/ML IV SOLN
4.0000 mg | Freq: Once | INTRAVENOUS | Status: AC
  Administered 2021-10-28: 4 mg via INTRAVENOUS
  Filled 2021-10-28: qty 1

## 2021-10-28 MED ORDER — SODIUM CHLORIDE 0.9 % IV BOLUS
500.0000 mL | Freq: Once | INTRAVENOUS | Status: AC
  Administered 2021-10-28: 500 mL via INTRAVENOUS

## 2021-10-28 MED ORDER — ONDANSETRON HCL 4 MG/2ML IJ SOLN
4.0000 mg | Freq: Once | INTRAMUSCULAR | Status: AC
  Administered 2021-10-28: 4 mg via INTRAVENOUS
  Filled 2021-10-28: qty 2

## 2021-10-28 MED ORDER — IOHEXOL 300 MG/ML  SOLN
100.0000 mL | Freq: Once | INTRAMUSCULAR | Status: AC | PRN
  Administered 2021-10-28: 100 mL via INTRAVENOUS

## 2021-10-28 MED ORDER — MORPHINE SULFATE (PF) 2 MG/ML IV SOLN
2.0000 mg | Freq: Once | INTRAVENOUS | Status: AC
  Administered 2021-10-28: 2 mg via INTRAVENOUS
  Filled 2021-10-28: qty 1

## 2021-10-28 MED ORDER — MORPHINE SULFATE (PF) 2 MG/ML IV SOLN
2.0000 mg | INTRAVENOUS | Status: DC | PRN
  Administered 2021-10-28 – 2021-10-30 (×7): 2 mg via INTRAVENOUS
  Filled 2021-10-28 (×9): qty 1

## 2021-10-28 NOTE — ED Provider Notes (Signed)
Emergency Department Provider Note   I have reviewed the triage vital signs and the nursing notes.   HISTORY  Chief Complaint Abdominal Pain   HPI Diane Giles is a 67 y.o. female with past medical history reviewed below including prior diverticulitis presents emergency department with acute onset abdominal pain.  Patient's husband at bedside provides most of the history as patient appears fairly uncomfortable.  She describes similar pain in the past with diverticulitis and suspects that today.  Husband states that her pain is mainly been central and not particularly radiating.  Patient denies pain into the chest or shortness of breath.  She still nauseated without vomiting.  No bloody diarrhea.   Past Medical History:  Diagnosis Date   Aortic stenosis    mild-moderate AS 04/22/19 echo   Aortoiliac occlusive disease ([place]) 12/03/2016   Arthritis    Diabetes mellitus without complication ([place])    per pt she is pre- diabetic   Diverticulitis    Gout    Heart murmur    Hypertension    Osteoporosis    Sleep apnea    Tobacco use disorder 07/04/2012    Review of Systems  Constitutional: No fever/chills Eyes: No visual changes. ENT: No sore throat. Cardiovascular: Denies chest pain. Respiratory: Denies shortness of breath. Gastrointestinal: Positive abdominal pain.  Positive nausea, no vomiting.  No diarrhea.  No constipation. Genitourinary: Negative for dysuria. Musculoskeletal: Negative for back pain. Skin: Negative for rash. Neurological: Negative for headaches, focal weakness or numbness.   ____________________________________________   PHYSICAL EXAM:  VITAL SIGNS: ED Triage Vitals  Enc Vitals Group     BP 10/28/21 1817 (!) 142/53     Pulse Rate 10/28/21 1817 66     Resp 10/28/21 1817 (!) 24     Temp 10/28/21 1817 98.2 F (36.8 C)     Temp Source 10/28/21 1817 Oral     SpO2 10/28/21 1817 100 %   Constitutional: Alert and oriented. Patient tearful and  appears uncomfortable.  Eyes: Conjunctivae are normal.  Head: Atraumatic. Nose: No congestion/rhinnorhea. Mouth/Throat: Mucous membranes are moist.   Neck: No stridor.   Cardiovascular: Normal rate, regular rhythm. Good peripheral circulation. Grossly normal heart sounds.   Respiratory: Normal respiratory effort.  No retractions. Lungs CTAB. Gastrointestinal: Soft with diffuse tenderness. No particular peritonitis. No distention.  Musculoskeletal: No lower extremity tenderness nor edema. No gross deformities of extremities. Neurologic:  Normal speech and language. No gross focal neurologic deficits are appreciated.  Skin:  Skin is warm, dry and intact. No rash noted.   ____________________________________________   LABS (all labs ordered are listed, but only abnormal results are displayed)  Labs Reviewed  COMPREHENSIVE METABOLIC PANEL - Abnormal; Notable for the following components:      Result Value   Glucose, Bld 114 (*)    Total Protein 6.4 (*)    AST 13 (*)    Alkaline Phosphatase 131 (*)    All other components within normal limits  CBC - Abnormal; Notable for the following components:   WBC 12.5 (*)    RBC 3.70 (*)    Hemoglobin 11.3 (*)    HCT 34.3 (*)    All other components within normal limits  BASIC METABOLIC PANEL - Abnormal; Notable for the following components:   Glucose, Bld 125 (*)    Calcium 8.8 (*)    All other components within normal limits  CBC - Abnormal; Notable for the following components:   WBC 13.7 (*)  RBC 3.57 (*)    Hemoglobin 11.3 (*)    HCT 33.8 (*)    All other components within normal limits  GLUCOSE, CAPILLARY - Abnormal; Notable for the following components:   Glucose-Capillary 124 (*)    All other components within normal limits  GLUCOSE, CAPILLARY - Abnormal; Notable for the following components:   Glucose-Capillary 121 (*)    All other components within normal limits  GLUCOSE, CAPILLARY - Abnormal; Notable for the following  components:   Glucose-Capillary 127 (*)    All other components within normal limits  GLUCOSE, CAPILLARY - Abnormal; Notable for the following components:   Glucose-Capillary 107 (*)    All other components within normal limits  BASIC METABOLIC PANEL - Abnormal; Notable for the following components:   Glucose, Bld 115 (*)    All other components within normal limits  CBC - Abnormal; Notable for the following components:   RBC 3.39 (*)    Hemoglobin 10.8 (*)    HCT 32.0 (*)    All other components within normal limits  GLUCOSE, CAPILLARY - Abnormal; Notable for the following components:   Glucose-Capillary 114 (*)    All other components within normal limits  GLUCOSE, CAPILLARY - Abnormal; Notable for the following components:   Glucose-Capillary 107 (*)    All other components within normal limits  GLUCOSE, CAPILLARY - Abnormal; Notable for the following components:   Glucose-Capillary 104 (*)    All other components within normal limits  GLUCOSE, CAPILLARY - Abnormal; Notable for the following components:   Glucose-Capillary 120 (*)    All other components within normal limits  GLUCOSE, CAPILLARY - Abnormal; Notable for the following components:   Glucose-Capillary 132 (*)    All other components within normal limits  GLUCOSE, CAPILLARY - Abnormal; Notable for the following components:   Glucose-Capillary 118 (*)    All other components within normal limits  BASIC METABOLIC PANEL - Abnormal; Notable for the following components:   Glucose, Bld 105 (*)    Calcium 8.6 (*)    All other components within normal limits  CBC - Abnormal; Notable for the following components:   RBC 3.25 (*)    Hemoglobin 10.0 (*)    HCT 30.8 (*)    All other components within normal limits  VITAMIN B12 - Abnormal; Notable for the following components:   Vitamin B-12 119 (*)    All other components within normal limits  IRON AND TIBC - Abnormal; Notable for the following components:   Iron 19 (*)     Saturation Ratios 7 (*)    All other components within normal limits  GLUCOSE, CAPILLARY - Abnormal; Notable for the following components:   Glucose-Capillary 104 (*)    All other components within normal limits  GLUCOSE, CAPILLARY - Abnormal; Notable for the following components:   Glucose-Capillary 104 (*)    All other components within normal limits  GLUCOSE, CAPILLARY - Abnormal; Notable for the following components:   Glucose-Capillary 102 (*)    All other components within normal limits  LIPASE, BLOOD  HEMOGLOBIN A1C  MAGNESIUM  MAGNESIUM  MAGNESIUM  FOLATE  FERRITIN   ____________________________________________  EKG   EKG Interpretation  Date/Time:  Saturday October 28 2021 18:50:46 EDT Ventricular Rate:  62 PR Interval:  128 QRS Duration: 89 QT Interval:  401 QTC Calculation: 408 R Axis:   61 Text Interpretation: Sinus rhythm Confirmed by Nanda Quinton [phone]) on 10/28/2021 7:50:39 PM  ____________________________________________   PROCEDURES  Procedure(s) performed:   Procedures  None ____________________________________________   INITIAL IMPRESSION / ASSESSMENT AND PLAN / ED COURSE  Pertinent labs & imaging results that were available during my care of the patient were reviewed by me and considered in my medical decision making (see chart for details).   This patient is Presenting for Evaluation of abdominal pain, which does require a range of treatment options, and is a complaint that involves a high risk of morbidity and mortality.  The Differential Diagnoses includes but is not exclusive to acute cholecystitis, intrathoracic causes for epigastric abdominal pain, gastritis, duodenitis, pancreatitis, small bowel or large bowel obstruction, abdominal aortic aneurysm, hernia, gastritis, etc.   Critical Interventions-    Medications  sodium chloride 0.9 % bolus 500 mL ( Intravenous Stopped 10/28/21 2218)  ondansetron (ZOFRAN) injection 4 mg  (4 mg Intravenous Given 10/28/21 1924)  morphine (PF) 4 MG/ML injection 4 mg (4 mg Intravenous Given 10/28/21 1924)  iohexol (OMNIPAQUE) 300 MG/ML solution 100 mL (100 mLs Intravenous Contrast Given 10/28/21 2008)  morphine (PF) 2 MG/ML injection 2 mg (2 mg Intravenous Given 10/28/21 2111)  diatrizoate meglumine-sodium (GASTROGRAFIN) 66-10 % solution 90 mL (90 mLs Per NG tube Given 10/29/21 1515)  magnesium sulfate IVPB 2 g 50 mL (0 g Intravenous Stopped 10/30/21 1119)  morphine (PF) 2 MG/ML injection 1 mg (1 mg Intravenous Given 10/30/21 1137)    Reassessment after intervention: Symptoms improved.    I did obtain Additional Historical Information from husband at bedside, as the patient is very uncomfortable.  I decided to review pertinent External Data, and in summary no recent ED visits for similar.   Clinical Laboratory Tests Ordered, included mild leukocytosis to 12.5.  Lipase normal.  LFTs and bilirubin unremarkable.  No acute kidney injury or electrolyte disturbance.  No DKA.  Radiologic Tests Ordered, included CT abdomen/pelvis. I independently interpreted the images and agree with radiology interpretation.   Cardiac Monitor Tracing which shows NSR.   Social Determinants of Health Risk patient is a former smoker.   Consult complete with General Surgery. Plan for consultation and [facility] admit.   Spoke with Hospitalist admit.   Medical Decision Making: Summary:  Patient presents emergency department for evaluation of abdominal pain with nausea.  No chest discomfort.  EKG reassuring.  Low suspicion for ACS overall.  Diffuse tenderness on exam is prompting CT imaging.  No focal peritonitis.   Reevaluation with update and discussion with patient. Discussed CT results and plan for med mgmt of [facility].   Disposition: admit  ____________________________________________  FINAL CLINICAL IMPRESSION(S) / ED DIAGNOSES  Final diagnoses:  Partial small bowel obstruction ([place])     NEW  OUTPATIENT MEDICATIONS STARTED DURING THIS VISIT:  Discharge Medication List as of 10/31/2021  3:01 PM     START taking these medications   Details  vitamin B-12 (CYANOCOBALAMIN) 1000 MCG tablet Take 1 tablet (1,000 mcg total) by mouth daily., Starting Tue 10/31/2021, OTC    ondansetron (ZOFRAN) 4 MG tablet Take 1 tablet (4 mg total) by mouth every 6 (six) hours as needed for nausea., Starting Tue 10/31/2021, Normal        Note:  This document was prepared using Dragon voice recognition software and may include unintentional dictation errors.  Nanda Quinton, MD, [facility]    Tyaira Heward, [facility], MD 11/01/21 [phone]

## 2021-10-29 ENCOUNTER — Observation Stay (HOSPITAL_COMMUNITY): Payer: Medicare Other

## 2021-10-29 ENCOUNTER — Inpatient Hospital Stay (HOSPITAL_COMMUNITY): Payer: Medicare Other

## 2021-10-29 ENCOUNTER — Encounter (HOSPITAL_COMMUNITY): Payer: Self-pay | Admitting: Family Medicine

## 2021-10-29 DIAGNOSIS — I739 Peripheral vascular disease, unspecified: Secondary | ICD-10-CM | POA: Diagnosis not present

## 2021-10-29 DIAGNOSIS — E1165 Type 2 diabetes mellitus with hyperglycemia: Secondary | ICD-10-CM | POA: Diagnosis not present

## 2021-10-29 DIAGNOSIS — K56609 Unspecified intestinal obstruction, unspecified as to partial versus complete obstruction: Secondary | ICD-10-CM | POA: Diagnosis not present

## 2021-10-29 DIAGNOSIS — Z800 Family history of malignant neoplasm of digestive organs: Secondary | ICD-10-CM | POA: Diagnosis not present

## 2021-10-29 DIAGNOSIS — Z7982 Long term (current) use of aspirin: Secondary | ICD-10-CM | POA: Diagnosis not present

## 2021-10-29 DIAGNOSIS — Z7985 Long-term (current) use of injectable non-insulin antidiabetic drugs: Secondary | ICD-10-CM | POA: Diagnosis not present

## 2021-10-29 DIAGNOSIS — E1151 Type 2 diabetes mellitus with diabetic peripheral angiopathy without gangrene: Secondary | ICD-10-CM | POA: Diagnosis present

## 2021-10-29 DIAGNOSIS — Z7984 Long term (current) use of oral hypoglycemic drugs: Secondary | ICD-10-CM | POA: Diagnosis not present

## 2021-10-29 DIAGNOSIS — Z885 Allergy status to narcotic agent status: Secondary | ICD-10-CM | POA: Diagnosis not present

## 2021-10-29 DIAGNOSIS — M1990 Unspecified osteoarthritis, unspecified site: Secondary | ICD-10-CM | POA: Diagnosis present

## 2021-10-29 DIAGNOSIS — Z79899 Other long term (current) drug therapy: Secondary | ICD-10-CM | POA: Diagnosis not present

## 2021-10-29 DIAGNOSIS — D638 Anemia in other chronic diseases classified elsewhere: Secondary | ICD-10-CM | POA: Diagnosis present

## 2021-10-29 DIAGNOSIS — R109 Unspecified abdominal pain: Secondary | ICD-10-CM | POA: Diagnosis present

## 2021-10-29 DIAGNOSIS — E538 Deficiency of other specified B group vitamins: Secondary | ICD-10-CM | POA: Diagnosis present

## 2021-10-29 DIAGNOSIS — Z87891 Personal history of nicotine dependence: Secondary | ICD-10-CM | POA: Diagnosis not present

## 2021-10-29 DIAGNOSIS — M109 Gout, unspecified: Secondary | ICD-10-CM | POA: Diagnosis present

## 2021-10-29 DIAGNOSIS — G4730 Sleep apnea, unspecified: Secondary | ICD-10-CM | POA: Diagnosis present

## 2021-10-29 DIAGNOSIS — I350 Nonrheumatic aortic (valve) stenosis: Secondary | ICD-10-CM | POA: Diagnosis present

## 2021-10-29 DIAGNOSIS — K56600 Partial intestinal obstruction, unspecified as to cause: Secondary | ICD-10-CM | POA: Diagnosis not present

## 2021-10-29 DIAGNOSIS — Z8349 Family history of other endocrine, nutritional and metabolic diseases: Secondary | ICD-10-CM | POA: Diagnosis not present

## 2021-10-29 DIAGNOSIS — I10 Essential (primary) hypertension: Secondary | ICD-10-CM | POA: Diagnosis present

## 2021-10-29 DIAGNOSIS — K5651 Intestinal adhesions [bands], with partial obstruction: Secondary | ICD-10-CM | POA: Diagnosis present

## 2021-10-29 DIAGNOSIS — Z96641 Presence of right artificial hip joint: Secondary | ICD-10-CM | POA: Diagnosis present

## 2021-10-29 DIAGNOSIS — M810 Age-related osteoporosis without current pathological fracture: Secondary | ICD-10-CM | POA: Diagnosis present

## 2021-10-29 LAB — CBC
HCT: 33.8 % — ABNORMAL LOW (ref 36.0–46.0)
Hemoglobin: 11.3 g/dL — ABNORMAL LOW (ref 12.0–15.0)
MCH: 31.7 pg (ref 26.0–34.0)
MCHC: 33.4 g/dL (ref 30.0–36.0)
MCV: 94.7 fL (ref 80.0–100.0)
Platelets: 255 10*3/uL (ref 150–400)
RBC: 3.57 MIL/uL — ABNORMAL LOW (ref 3.87–5.11)
RDW: 15.3 % (ref 11.5–15.5)
WBC: 13.7 10*3/uL — ABNORMAL HIGH (ref 4.0–10.5)
nRBC: 0 % (ref 0.0–0.2)

## 2021-10-29 LAB — GLUCOSE, CAPILLARY
Glucose-Capillary: 124 mg/dL — ABNORMAL HIGH (ref 70–99)
Glucose-Capillary: 121 mg/dL — ABNORMAL HIGH (ref 70–99)
Glucose-Capillary: 127 mg/dL — ABNORMAL HIGH (ref 70–99)
Glucose-Capillary: 107 mg/dL — ABNORMAL HIGH (ref 70–99)
Glucose-Capillary: 114 mg/dL — ABNORMAL HIGH (ref 70–99)
Glucose-Capillary: 107 mg/dL — ABNORMAL HIGH (ref 70–99)

## 2021-10-29 LAB — BASIC METABOLIC PANEL
Anion gap: 8 (ref 5–15)
BUN: 11 mg/dL (ref 8–23)
CO2: 28 mmol/L (ref 22–32)
Calcium: 8.8 mg/dL — ABNORMAL LOW (ref 8.9–10.3)
Chloride: 103 mmol/L (ref 98–111)
Creatinine, Ser: 0.86 mg/dL (ref 0.44–1.00)
GFR, Estimated: >60 mL/min (ref >60)
Glucose, Bld: 125 mg/dL — ABNORMAL HIGH (ref 70–99)
Potassium: 3.8 mmol/L (ref 3.5–5.1)
Sodium: 139 mmol/L (ref 135–145)

## 2021-10-29 LAB — MAGNESIUM: Magnesium: 1.9 mg/dL (ref 1.7–2.4)

## 2021-10-29 LAB — HEMOGLOBIN A1C
Hgb A1c MFr Bld: 5.5 % (ref 4.8–5.6)
Mean Plasma Glucose: 111.15 mg/dL

## 2021-10-29 MED ORDER — HYDRALAZINE HCL 20 MG/ML IJ SOLN: 10.0000 mg | Freq: Four times a day (QID) | INTRAMUSCULAR | Status: DC | PRN

## 2021-10-29 MED ORDER — INSULIN ASPART 100 UNIT/ML IJ SOLN: 0.0000 [IU] | INTRAMUSCULAR | Status: DC

## 2021-10-29 MED ORDER — ONDANSETRON HCL 4 MG/2ML IJ SOLN
4.0000 mg | Freq: Four times a day (QID) | INTRAMUSCULAR | Status: DC | PRN
  Administered 2021-10-29 – 2021-10-30 (×2): 4 mg via INTRAVENOUS
  Filled 2021-10-29 (×3): qty 2

## 2021-10-29 MED ORDER — ONDANSETRON HCL 4 MG PO TABS: 4.0000 mg | ORAL_TABLET | Freq: Four times a day (QID) | ORAL | Status: DC | PRN

## 2021-10-29 MED ORDER — LACTATED RINGERS IV SOLN: INTRAVENOUS | Status: DC

## 2021-10-29 MED ORDER — DIATRIZOATE MEGLUMINE & SODIUM 66-10 % PO SOLN
90.0000 mL | Freq: Once | ORAL | Status: AC
Start: 2021-10-29 — End: 2021-10-29
  Administered 2021-10-29: 90 mL via NASOGASTRIC
  Filled 2021-10-29: qty 90

## 2021-10-29 NOTE — Progress Notes (Signed)
PROGRESS NOTE    Diane Giles  YNW:[id number] DOB: March 01, 1956 DOA: 10/28/2021 PCP: Margot Ables, MD (Inactive)    Chief Complaint  Patient presents with   Abdominal Pain    Brief Narrative:  Patient pleasant 66 year old female history of PAD status post aortobiiliac grafting, hypertension, well-controlled type 2 diabetes presented to the ED with abdominal pain, back pain, nausea.  Patient noted to be straining to pass bowel movement which happened 1 day prior to admission but was passing flatus prior to presentation in the ED.  Patient seen in the ED CT scan concerning for partial small bowel obstruction with transition in the right midabdomen, no mass seen.  Patient placed on bowel rest, IV fluids and transferred to [facility].  Patient seen in consultation by general surgery and NG tube placed.  SBO protocol placed.   Assessment & Plan:   Principal Problem:   Partial small bowel obstruction (HCC) Active Problems:   Essential hypertension, benign   Type 2 diabetes mellitus (HCC)   PAD (peripheral artery disease) (HCC)  #1 partial small bowel obstruction -Patient presenting 1 day history of abdominal pain, nausea, CT findings concerning for at least partial small bowel obstruction with transition in the right midabdomen, no masses seen. -Patient with nausea this morning, some diffuse abdominal discomfort, seen by general surgery and NG tube placed. -After NG tube placed while awaiting abdominal films patient noted to have significant emesis while I was examining her. -Continue bowel rest, IV fluids, IV antiemetics, pain management, supportive care. -Patient seen in consultation by general surgery who feel CT findings consistent with SBO, likely adhesive and recommending to begin SBO protocol with Gastrografin 2 hours after NG placement. -Replete electrolytes. -Supportive care. -Per general surgery.  2.  Well-controlled diabetes mellitus type 2 -Hemoglobin  A1c 5.5 (10/29/2021) -CBG 121 this morning. -Hold home regimen metformin. -SSI.  3. hypertension -BP stable. -Hold antihypertensive medications. -Hydralazine as needed.  4.  PAD -History of aortic biiliac bypass graft -Patient noted to have seen vascular surgery last week had normal ABIs. -When tolerating oral intake resume aspirin, statin. -Outpatient follow-up.     DVT prophylaxis: SCDs Code Status: Full Family Communication: Updated patient and husband at bedside. Disposition: Likely home when clinically improved, cleared by general surgery.  Status is: Inpatient The patient will require care spanning > 2 midnights and should be moved to inpatient because: Severity of illness   Consultants:  General surgery: Dr. Freida Busman 10/29/2021  Procedures:  CT abdomen and pelvis 10/28/2021 Small bowel obstruction protocol   Antimicrobials:  None   Subjective: Patient sitting up in bed.  NG tube just placed.  Patient noted to have a small episode of emesis this morning.  Some nausea.  No chest pain.  No shortness of breath.  Patient noted to have a bout of significant emesis while I was in the room.  Husband at bedside.  Objective: Vitals:   10/28/21 2221 10/29/21 0025 10/29/21 0436 10/29/21 0750  BP:  (!) 145/52 (!) 114/45 (!) 111/48  Pulse:  62 (!) 59 (!) 59  Resp:   16 17  Temp: 97.8 F (36.6 C) 97.9 F (36.6 C) 98.2 F (36.8 C) 97.7 F (36.5 C)  TempSrc: Oral Oral Oral Oral  SpO2:  100% 100% 96%  Weight:  74.4 kg    Height:  4\' 11"  (1.499 m)      Intake/Output Summary (Last 24 hours) at 10/29/2021 1102 Last data filed at 10/28/2021 2323 Gross per 24 hour  Intake 531.62 ml  Output --  Net 531.62 ml   Filed Weights   10/29/21 0025  Weight: 74.4 kg    Examination:  General exam: Appears calm and comfortable.  NG tube in place. Respiratory system: Clear to auscultation.  No wheezes, no crackles, no rhonchi.  Respiratory effort normal. Cardiovascular system: S1  & S2 heard, RRR. No JVD, murmurs, rubs, gallops or clicks. No pedal edema. Gastrointestinal system: Abdomen is nondistended, soft and some diffuse tenderness to palpation.  Decreased bowel sounds.  No rebound.  No guarding.  Central nervous system: Alert and oriented. No focal neurological deficits. Extremities: Symmetric 5 x 5 power. Skin: No rashes, lesions or ulcers Psychiatry: Judgement and insight appear normal. Mood & affect appropriate.     Data Reviewed: I have personally reviewed following labs and imaging studies  CBC: Recent Labs  Lab 10/28/21 1855 10/29/21 0543  WBC 12.5* 13.7*  HGB 11.3* 11.3*  HCT 34.3* 33.8*  MCV 92.7 94.7  PLT 304 255    Basic Metabolic Panel: Recent Labs  Lab 10/28/21 1855 10/29/21 0543  NA 140 139  K 4.0 3.8  CL 102 103  CO2 27 28  GLUCOSE 114* 125*  BUN 12 11  CREATININE 0.83 0.86  CALCIUM 9.8 8.8*  MG  --  1.9    GFR: Estimated Creatinine Clearance: 55.1 mL/min (by C-G formula based on SCr of 0.86 mg/dL).  Liver Function Tests: Recent Labs  Lab 10/28/21 1855  AST 13*  ALT 13  ALKPHOS 131*  BILITOT 0.5  PROT 6.4*  ALBUMIN 4.0    CBG: Recent Labs  Lab 10/29/21 0527 10/29/21 0752  GLUCAP 124* 121*     No results found for this or any previous visit (from the past 240 hour(s)).       Radiology Studies: CT ABDOMEN PELVIS W CONTRAST  Result Date: 10/28/2021 CLINICAL DATA:  Acute abdominal pain EXAM: CT ABDOMEN AND PELVIS WITH CONTRAST TECHNIQUE: Multidetector CT imaging of the abdomen and pelvis was performed using the standard protocol following bolus administration of intravenous contrast. RADIATION DOSE REDUCTION: This exam was performed according to the departmental dose-optimization program which includes automated exposure control, adjustment of the mA and/or kV according to patient size and/or use of iterative reconstruction technique. CONTRAST:  OMNIPAQUE IOHEXOL 300 MG/ML  SOLN COMPARISON:   08/26/2013 FINDINGS: Lower chest: No acute abnormality. Hepatobiliary: No focal liver abnormality is seen. No gallstones, gallbladder wall thickening, or biliary dilatation. Pancreas: Unremarkable. No pancreatic ductal dilatation or surrounding inflammatory changes. Spleen: Normal in size without focal abnormality. Adrenals/Urinary Tract: Adrenal glands are within normal limits. Kidneys demonstrate a normal enhancement pattern bilaterally. No renal calculi or obstructive changes are noted. Normal excretion is noted on delayed images. Bladder is within normal limits. Stomach/Bowel: Scattered diverticular changes noted without evidence of diverticulitis. No obstructive or inflammatory changes of the colon are seen. The appendix is within normal limits. Stomach is distended with fluid. The proximal jejunum appears within normal limits. In the region of the distal jejunum and proximal ileum, there are multiple fluid-filled loops of dilated small bowel identified. A transition zone is noted in the right mid abdomen although no discrete mass is seen. More distal small bowel appears within normal limits. Vascular/Lymphatic: Aortic atherosclerosis. No enlarged abdominal or pelvic lymph nodes. Reproductive: Uterus and bilateral adnexa are unremarkable. Other: No abdominal wall hernia or abnormality. No abdominopelvic ascites. Musculoskeletal: Right hip replacement is noted. Degenerative changes of the lumbar spine are noted. IMPRESSION: Multiple dilated  loops of mid small bowel consistent with at least partial small bowel obstruction. A transition zone is noted in the right mid abdomen although no mass is seen. These changes may be related to adhesions. No other focal abnormality is noted. Electronically Signed   By: Alcide Clever M.D.   On: 10/28/2021 20:40        Scheduled Meds:  diatrizoate meglumine-sodium  90 mL Per NG tube Once   insulin aspart  0-6 Units Subcutaneous Q4H   Continuous Infusions:  lactated  ringers 75 mL/hr at 10/29/21 0929     LOS: 0 days    Time spent: 40 minutes    Ramiro Harvest, MD [facility]   To contact the attending provider between 7A-7P or the covering provider during after hours 7P-7A, please log into the web site [email or web address] and access using universal [place] password for that web site. If you do not have the password, please call the hospital operator.  10/29/2021, 11:02 AM

## 2021-10-29 NOTE — H&P (Addendum)
History and Physical    Diane Giles:[id number] DOB: 05/25/1955 DOA: 10/28/2021  PCP: Margot Ables, MD (Inactive)   Patient coming from: Home   Chief Complaint: Abdominal pain, back pain  HPI: Diane Giles is a pleasant 66 y.o. female with medical history significant for PAD status post aortobiiliac grafting, hypertension, and well-controlled type 2 diabetes mellitus who presents to the emergency department with abdominal pain, back pain, and nausea.  Patient reports that she had been in her usual state of health until yesterday morning when she developed pain in the low back and abdomen with nausea but no vomiting.  She straining to pass a small bowel movement yesterday and was passing flatus but not since going to ED.  She reports a history of diverticulitis and suspected that was the cause of her current symptoms.  [facility] ED Course: Upon arrival to the ED, patient is found to be afebrile with normal heart rate and stable blood pressure. CT was concerning for at least partial SBO with transition in right mid abdomen, no mass seen. Surgery was consulted by the ED MD, patient was treated with morphine, Zofran, and IVF, and she was transferred to [facility] for admission.   Review of Systems:  All other systems reviewed and apart from HPI, are negative.  Past Medical History:  Diagnosis Date   Aortic stenosis    mild-moderate AS 04/22/19 echo   Aortoiliac occlusive disease (HCC) 12/03/2016   Arthritis    Diabetes mellitus without complication (HCC)    per pt she is pre- diabetic   Diverticulitis    Gout    Heart murmur    Hypertension    Osteoporosis    Sleep apnea    Tobacco use disorder 07/04/2012    Past Surgical History:  Procedure Laterality Date   ABDOMINAL AORTOGRAM W/LOWER EXTREMITY N/A 12/03/2016   Procedure: Abdominal Aortogram w/Lower Extremity;  Surgeon: Chuck Hint, MD;  Location: [facility] INVASIVE CV LAB;  Service: Cardiovascular;   Laterality: N/A;   AORTA -INNOMIATE BYPASS N/A 05/18/2019   Procedure: AORTA -INNOMIATE BYPASS Using Hemashield Gold Graft Size 8mm;  Surgeon: Alleen Borne, MD;  Location: [facility] OR;  Service: Open Heart Surgery;  Laterality: N/A;   DILATATION & CURETTAGE/HYSTEROSCOPY WITH MYOSURE N/A 12/27/2015   Procedure: DILATATION & CURETTAGE/HYSTEROSCOPY;  Surgeon: Patton Salles, MD;  Location: [facility] ORS;  Service: Gynecology;  Laterality: N/A;   DILATION AND CURETTAGE OF UTERUS  11/2015   ELBOW SURGERY Left ~2007   tendon repair by Dr. Ranell Patrick   EYE SURGERY Bilateral 2008   lasik   JOINT REPLACEMENT     lower aortic bypass  2000   per patient   PERIPHERAL VASCULAR INTERVENTION  12/03/2016   Procedure: Peripheral Vascular Intervention;  Surgeon: Chuck Hint, MD;  Location: [facility] INVASIVE CV LAB;  Service: Cardiovascular;;   STERNOTOMY  05/18/2019   Procedure: Sternotomy;  Surgeon: Alleen Borne, MD;  Location: [facility] OR;  Service: Open Heart Surgery;;   TOTAL HIP ARTHROPLASTY Right 07/27/2014   Procedure: RIGHT TOTAL HIP ARTHROPLASTY ANTERIOR APPROACH;  Surgeon: Durene Romans, MD;  Location: [facility] ORS;  Service: Orthopedics;  Laterality: Right;    Social History:   reports that she quit smoking about 4 years ago. Her smoking use included cigarettes. She has never used smokeless tobacco. She reports that she does not drink alcohol and does not use drugs.  Allergies  Allergen Reactions   Codeine Itching    Family History  Problem Relation Age of Onset   Cancer Father 7       Dec age 75 with pancreatic Ca   Migraines Mother    Thyroid disease Mother        hypothyroid     Prior to Admission medications   Medication Sig Start Date End Date Taking? Authorizing Provider  acetaminophen (TYLENOL) 500 MG tablet Take 1,000 mg by mouth every 6 (six) hours as needed for mild pain.    [provider]  allopurinol (ZYLOPRIM) 100 MG tablet Take 300 mg by mouth daily.    [provider]  aspirin EC 81 MG tablet Take 1 tablet (81 mg total) by mouth 2 (two) times daily. To be taken after surgery to prevent blood clots 08/21/21   Cristie Hem, PA-C  atenolol-chlorthalidone (TENORETIC) 50-25 MG tablet TAKE 1 TABLET IN THE MORNING. Patient taking differently: Take 1 tablet by mouth daily. 08/15/18   Sherren Mocha, MD  atorvastatin (LIPITOR) 80 MG tablet Take 1 tablet (80 mg total) by mouth daily at 6 PM. 08/16/17   Sherren Mocha, MD  clobetasol cream (TEMOVATE) 0.05 % Apply 1 application topically 2 (two) times daily as needed (rash). 10/13/19   [provider]  colchicine 0.6 MG tablet Take 1.2mg  (2 tablets) then 0.6mg  (1 tablet) 1 hour after. Then, take 1 tablet every day for 7 days. Patient taking differently: Take 0.6-1.2 mg by mouth See admin instructions. Take 1.2mg  (2 tablets) then 0.6mg  (1 tablet) 1 hour after. Then, take 1 tablet every day for 7 days. AS NEEDED 01/06/21   Edwin Cap, DPM  furosemide (LASIX) 20 MG tablet Take 1 tablet (20 mg total) by mouth every morning. 03/21/18   Sherren Mocha, MD  Lancets MISC Use as directed once a day. (DX: R73.9) 01/20/15   Sherren Mocha, MD  losartan (COZAAR) 25 MG tablet TAKE 1 TABLET IN THE P.M. Patient taking differently: Take 12.5 mg by mouth every evening. 08/16/17   Sherren Mocha, MD  metFORMIN (GLUCOPHAGE) 500 MG tablet TAKE 2 TABLETS TWICE DAILY WITH MEALS. Patient taking differently: Take 500 mg by mouth 2 (two) times daily with a meal. 08/16/17   Sherren Mocha, MD  OZEMPIC, 0.25 OR 0.5 MG/DOSE, 2 MG/3ML SOPN Inject 0.5 mg into the skin every Monday. 08/10/21   [provider]  potassium chloride (KLOR-CON M) 10 MEQ tablet Take 4 pills twice daily x 4 days 08/21/21   Cristie Hem, PA-C  pregabalin (LYRICA) 75 MG capsule Take 75 mg by mouth 2 (two) times daily. 01/01/20   [provider]    Physical Exam: Vitals:   10/28/21 2100 10/28/21 2221 10/29/21 0025 10/29/21 0436  BP: (!) 129/53  (!)  145/52 (!) 114/45  Pulse: 70  62 (!) 59  Resp: 11   16  Temp:  97.8 F (36.6 C) 97.9 F (36.6 C) 98.2 F (36.8 C)  TempSrc:  Oral Oral Oral  SpO2: 99%  100% 100%  Weight:   74.4 kg   Height:   4\' 11"  (1.499 m)     Constitutional: NAD, calm  Eyes: PERTLA, lids and conjunctivae normal ENMT: Mucous membranes are moist. Posterior pharynx clear of any exudate or lesions.   Neck: supple, no masses  Respiratory: no wheezing, no crackles. No accessory muscle use.  Cardiovascular: S1 & S2 heard, regular rate and rhythm. No extremity edema.   Abdomen: soft, tender in mid-abdomen without rebound pain or guarding.  High-pitched bowel sounds auscultated.  Musculoskeletal: no clubbing / cyanosis. No joint deformity upper and lower extremities.   Skin: no significant rashes, lesions, ulcers. Warm, dry, well-perfused. Neurologic: CN 2-12 grossly intact. Moving all extremities. Alert and oriented.  Psychiatric: Pleasant. Cooperative.    Labs and Imaging on Admission: I have personally reviewed following labs and imaging studies  CBC: Recent Labs  Lab 10/28/21 1855  WBC 12.5*  HGB 11.3*  HCT 34.3*  MCV 92.7  PLT 304   Basic Metabolic Panel: Recent Labs  Lab 10/28/21 1855  NA 140  K 4.0  CL 102  CO2 27  GLUCOSE 114*  BUN 12  CREATININE 0.83  CALCIUM 9.8   GFR: Estimated Creatinine Clearance: 57 mL/min (by C-G formula based on SCr of 0.83 mg/dL). Liver Function Tests: Recent Labs  Lab 10/28/21 1855  AST 13*  ALT 13  ALKPHOS 131*  BILITOT 0.5  PROT 6.4*  ALBUMIN 4.0   Recent Labs  Lab 10/28/21 1855  LIPASE 25   No results for input(s): "AMMONIA" in the last 168 hours. Coagulation Profile: No results for input(s): "INR", "PROTIME" in the last 168 hours. Cardiac Enzymes: No results for input(s): "CKTOTAL", "CKMB", "CKMBINDEX", "TROPONINI" in the last 168 hours. BNP (last 3 results) No results for input(s): "PROBNP" in the last 8760 hours. HbA1C: No results for  input(s): "HGBA1C" in the last 72 hours. CBG: No results for input(s): "GLUCAP" in the last 168 hours. Lipid Profile: No results for input(s): "CHOL", "HDL", "LDLCALC", "TRIG", "CHOLHDL", "LDLDIRECT" in the last 72 hours. Thyroid Function Tests: No results for input(s): "TSH", "T4TOTAL", "FREET4", "T3FREE", "THYROIDAB" in the last 72 hours. Anemia Panel: No results for input(s): "VITAMINB12", "FOLATE", "FERRITIN", "TIBC", "IRON", "RETICCTPCT" in the last 72 hours. Urine analysis:    Component Value Date/Time   COLORURINE STRAW (A) 05/14/2019 1313   APPEARANCEUR HAZY (A) 05/14/2019 1313   LABSPEC 1.006 05/14/2019 1313   PHURINE 6.0 05/14/2019 1313   GLUCOSEU NEGATIVE 05/14/2019 1313   HGBUR NEGATIVE 05/14/2019 1313   BILIRUBINUR NEGATIVE 05/14/2019 1313   BILIRUBINUR negative 09/20/2017 1749   BILIRUBINUR neg 05/14/2014 1515   KETONESUR NEGATIVE 05/14/2019 1313   PROTEINUR NEGATIVE 05/14/2019 1313   UROBILINOGEN 0.2 09/20/2017 1749   UROBILINOGEN 0.2 07/19/2014 0927   NITRITE NEGATIVE 05/14/2019 1313   LEUKOCYTESUR LARGE (A) 05/14/2019 1313   Sepsis Labs: @LABRCNTIP (procalcitonin:4,lacticidven:4) )No results found for this or any previous visit (from the past 240 hour(s)).   Radiological Exams on Admission: CT ABDOMEN PELVIS W CONTRAST  Result Date: 10/28/2021 CLINICAL DATA:  Acute abdominal pain EXAM: CT ABDOMEN AND PELVIS WITH CONTRAST TECHNIQUE: Multidetector CT imaging of the abdomen and pelvis was performed using the standard protocol following bolus administration of intravenous contrast. RADIATION DOSE REDUCTION: This exam was performed according to the departmental dose-optimization program which includes automated exposure control, adjustment of the mA and/or kV according to patient size and/or use of iterative reconstruction technique. CONTRAST:  OMNIPAQUE IOHEXOL 300 MG/ML  SOLN COMPARISON:  08/26/2013 FINDINGS: Lower chest: No acute abnormality. Hepatobiliary: No  focal liver abnormality is seen. No gallstones, gallbladder wall thickening, or biliary dilatation. Pancreas: Unremarkable. No pancreatic ductal dilatation or surrounding inflammatory changes. Spleen: Normal in size without focal abnormality. Adrenals/Urinary Tract: Adrenal glands are within normal limits. Kidneys demonstrate a normal enhancement pattern bilaterally. No renal calculi or obstructive changes are noted. Normal excretion is noted on delayed images. Bladder is within normal limits. Stomach/Bowel: Scattered diverticular changes noted without evidence of diverticulitis. No  obstructive or inflammatory changes of the colon are seen. The appendix is within normal limits. Stomach is distended with fluid. The proximal jejunum appears within normal limits. In the region of the distal jejunum and proximal ileum, there are multiple fluid-filled loops of dilated small bowel identified. A transition zone is noted in the right mid abdomen although no discrete mass is seen. More distal small bowel appears within normal limits. Vascular/Lymphatic: Aortic atherosclerosis. No enlarged abdominal or pelvic lymph nodes. Reproductive: Uterus and bilateral adnexa are unremarkable. Other: No abdominal wall hernia or abnormality. No abdominopelvic ascites. Musculoskeletal: Right hip replacement is noted. Degenerative changes of the lumbar spine are noted. IMPRESSION: Multiple dilated loops of mid small bowel consistent with at least partial small bowel obstruction. A transition zone is noted in the right mid abdomen although no mass is seen. These changes may be related to adhesions. No other focal abnormality is noted. Electronically Signed   By: Alcide Clever M.D.   On: 10/28/2021 20:40    EKG: Independently reviewed. SR, QTc 408 ms.   Assessment/Plan   1. Partial SBO  - Presents with one day of abdominal pain and nausea and has CT findings concerning for at least partial SBO with transition in right mid abdomen, no  mass seen  - Surgery was consulted by ED MD and she was treated with analgesia, IVF, and antiemetics  - She has not been vomiting and NGT was not placed  - Continue bowel-rest, symptom management, and follow-up on surgery's recommendations    2. Type II DM  - A1c was 6.3% in August 2022  - Check CBGs and use low-intensity SSI if needed   3. Hypertension  - Treat as needed only for now    4. PAD  - Hx of aortobiiliac bypass graft, saw vascular surgeon last wk and had normal ABIs - Resume ASA and statin when bowel function returns     DVT prophylaxis: SCDs  Code Status: Full  Level of Care: Level of care: Med-Surg Family Communication: None present  Disposition Plan:  Patient is from: home  Anticipated d/c is to: Home  Anticipated d/c date is: Possibly as early as 6/25 or 10/30/21  Patient currently: Pending return of bowel function  Consults called: surgery  Admission status: Observation     Briscoe Deutscher, MD [facility]  10/29/2021, 4:59 AM

## 2021-10-30 ENCOUNTER — Inpatient Hospital Stay (HOSPITAL_COMMUNITY): Payer: Medicare Other

## 2021-10-30 DIAGNOSIS — I10 Essential (primary) hypertension: Secondary | ICD-10-CM | POA: Diagnosis not present

## 2021-10-30 DIAGNOSIS — I739 Peripheral vascular disease, unspecified: Secondary | ICD-10-CM | POA: Diagnosis not present

## 2021-10-30 DIAGNOSIS — K56600 Partial intestinal obstruction, unspecified as to cause: Secondary | ICD-10-CM | POA: Diagnosis not present

## 2021-10-30 DIAGNOSIS — K56609 Unspecified intestinal obstruction, unspecified as to partial versus complete obstruction: Secondary | ICD-10-CM | POA: Diagnosis not present

## 2021-10-30 LAB — BASIC METABOLIC PANEL
Anion gap: 8 (ref 5–15)
BUN: 12 mg/dL (ref 8–23)
CO2: 30 mmol/L (ref 22–32)
Calcium: 9.1 mg/dL (ref 8.9–10.3)
Chloride: 103 mmol/L (ref 98–111)
Creatinine, Ser: 0.83 mg/dL (ref 0.44–1.00)
GFR, Estimated: >60 mL/min (ref >60)
Glucose, Bld: 115 mg/dL — ABNORMAL HIGH (ref 70–99)
Potassium: 4.1 mmol/L (ref 3.5–5.1)
Sodium: 141 mmol/L (ref 135–145)

## 2021-10-30 LAB — GLUCOSE, CAPILLARY
Glucose-Capillary: 104 mg/dL — ABNORMAL HIGH (ref 70–99)
Glucose-Capillary: 120 mg/dL — ABNORMAL HIGH (ref 70–99)
Glucose-Capillary: 132 mg/dL — ABNORMAL HIGH (ref 70–99)
Glucose-Capillary: 118 mg/dL — ABNORMAL HIGH (ref 70–99)

## 2021-10-30 LAB — CBC
HCT: 32 % — ABNORMAL LOW (ref 36.0–46.0)
Hemoglobin: 10.8 g/dL — ABNORMAL LOW (ref 12.0–15.0)
MCH: 31.9 pg (ref 26.0–34.0)
MCHC: 33.8 g/dL (ref 30.0–36.0)
MCV: 94.4 fL (ref 80.0–100.0)
Platelets: 260 10*3/uL (ref 150–400)
RBC: 3.39 MIL/uL — ABNORMAL LOW (ref 3.87–5.11)
RDW: 15.1 % (ref 11.5–15.5)
WBC: 10.4 10*3/uL (ref 4.0–10.5)
nRBC: 0 % (ref 0.0–0.2)

## 2021-10-30 LAB — MAGNESIUM: Magnesium: 1.8 mg/dL (ref 1.7–2.4)

## 2021-10-30 MED ORDER — MAGNESIUM SULFATE 2 GM/50ML IV SOLN
2.0000 g | Freq: Once | INTRAVENOUS | Status: AC
Start: 2021-10-30 — End: 2021-10-30
  Administered 2021-10-30: 2 g via INTRAVENOUS
  Filled 2021-10-30: qty 50

## 2021-10-30 MED ORDER — INSULIN ASPART 100 UNIT/ML IJ SOLN: 0.0000 [IU] | Freq: Four times a day (QID) | INTRAMUSCULAR | Status: DC

## 2021-10-30 MED ORDER — MORPHINE SULFATE (PF) 2 MG/ML IV SOLN
1.0000 mg | Freq: Once | INTRAVENOUS | Status: AC
  Administered 2021-10-30: 1 mg via INTRAVENOUS
  Filled 2021-10-30: qty 1

## 2021-10-30 NOTE — Progress Notes (Addendum)
PROGRESS NOTE    NYIMAH TOWE  WGN:[id number] DOB: 1955/10/30 DOA: 10/28/2021 PCP: Margot Ables, MD (Inactive)    Chief Complaint  Patient presents with   Abdominal Pain    Brief Narrative:  Patient pleasant 66 year old female history of PAD status post aortobiiliac grafting, hypertension, well-controlled type 2 diabetes presented to the ED with abdominal pain, back pain, nausea.  Patient noted to be straining to pass bowel movement which happened 1 day prior to admission but was passing flatus prior to presentation in the ED.  Patient seen in the ED CT scan concerning for partial small bowel obstruction with transition in the right midabdomen, no mass seen.  Patient placed on bowel rest, IV fluids and transferred to [facility].  Patient seen in consultation by general surgery and NG tube placed.  SBO protocol placed.   Assessment & Plan:   Principal Problem:   Partial small bowel obstruction (HCC) Active Problems:   Essential hypertension, benign   Type 2 diabetes mellitus (HCC)   PAD (peripheral artery disease) (HCC)   Small bowel obstruction (HCC)  #1 partial small bowel obstruction -Patient presented with 1 day history of abdominal pain, nausea, CT findings concerning for at least partial small bowel obstruction with transition in the right midabdomen, no masses seen. -Patient with nausea and emesis this morning, also with ongoing diffuse abdominal pain.   -Patient being followed by general surgery and is noted that patient's current NG tube is a pediatric tube and surgery recommended placement of larger tube as current one was not functioning.   -Patient initially was hesitant but agreeable to larger NG tube placement. -Continue bowel rest, IV fluids, IV antiemetics, pain management, supportive care. -Patient seen in consultation by general surgery who feel CT findings consistent with SBO, likely adhesive and patient currently undergoing SBO  protocol. -Replete electrolytes to keep potassium approximately 4, magnesium approximately at 2.. -Supportive care. -Per general surgery.  2.  Well-controlled diabetes mellitus type 2 -Hemoglobin A1c 5.5 (10/29/2021) -CBG 132 this morning. -Continue to hold home regimen metformin.   -Change SSI to every 6 hours.  3. hypertension -BP stable. -Continue to hold antihypertensive medications.   -Hydralazine as needed.  4.  PAD -History of aortic biiliac bypass graft -Patient noted to have seen vascular surgery last week had normal ABIs. -When tolerating oral intake resume aspirin, statin. -Outpatient follow-up.  5.  History of gout -Patient currently with SBO and as such unable to place back on home regimen allopurinol. -Once SBO is resolved and patient tolerating oral intake we will resume allopurinol  6.  Anemia -Likely dilutional. -Patient with no overt bleeding. -Check an anemia panel. -Follow H&H. -Transfusion threshold hemoglobin < 7.      DVT prophylaxis: SCDs Code Status: Full Family Communication: Updated patient and husband at bedside. Disposition: Likely home when clinically improved, cleared by general surgery.  Status is: Inpatient The patient will require care spanning > 2 midnights and should be moved to inpatient because: Severity of illness   Consultants:  General surgery: Dr. Freida Busman 10/29/2021  Procedures:  CT abdomen and pelvis 10/28/2021 Small bowel obstruction protocol   Antimicrobials:  None   Subjective: Patient sitting up in bed with NG tube in place.  Husband at bedside.  Patient stated had emesis this morning despite NG tube.  Patient and husband upset that wrong NG tube was placed as well as time it took for patient to get pain medication when asked for it yesterday.  Family also requesting  patient get a bird bath.  Patient initially reluctant for NG tube to be changed out but is in agreement.  Family also stating patient has a history of  gout and wondering whether there was an IV version for her allopurinol.  Objective: Vitals:   10/29/21 1652 10/29/21 2047 10/30/21 0430 10/30/21 0729  BP: (!) 130/45 (!) 132/55 (!) 143/60 (!) 123/44  Pulse: 69 63 80 65  Resp: 17 14 15 18   Temp: 98.5 F (36.9 C) 98.2 F (36.8 C) 99 F (37.2 C) 98.1 F (36.7 C)  TempSrc: Oral Oral Oral Oral  SpO2: 96% 96% 96% 94%  Weight:      Height:        Intake/Output Summary (Last 24 hours) at 10/30/2021 1128 Last data filed at 10/29/2021 2000 Gross per 24 hour  Intake 0 ml  Output 650 ml  Net -650 ml    Filed Weights   10/29/21 0025  Weight: 74.4 kg    Examination:  General exam: Appears calm and comfortable.  NG tube in place. Respiratory system: CTA B.  No wheezes, no crackles, no rhonchi.  Normal respiratory effort.  Speaking in full sentences. Cardiovascular system: RRR with 3/6 SEM.  No JVD.  No lower extremity edema. Gastrointestinal system: Abdomen is soft, nondistended, some diffuse tenderness to palpation.  Hypoactive bowel sounds.  No rebound.  No guarding.   Central nervous system: Alert and oriented. No focal neurological deficits. Extremities: Symmetric 5 x 5 power. Skin: No rashes, lesions or ulcers Psychiatry: Judgement and insight appear normal. Mood & affect appropriate.     Data Reviewed: I have personally reviewed following labs and imaging studies  CBC: Recent Labs  Lab 10/28/21 1855 10/29/21 0543 10/30/21 0140  WBC 12.5* 13.7* 10.4  HGB 11.3* 11.3* 10.8*  HCT 34.3* 33.8* 32.0*  MCV 92.7 94.7 94.4  PLT 304 255 260     Basic Metabolic Panel: Recent Labs  Lab 10/28/21 1855 10/29/21 0543 10/30/21 0140  NA 140 139 141  K 4.0 3.8 4.1  CL 102 103 103  CO2 27 28 30   GLUCOSE 114* 125* 115*  BUN 12 11 12   CREATININE 0.83 0.86 0.83  CALCIUM 9.8 8.8* 9.1  MG  --  1.9 1.8     GFR: Estimated Creatinine Clearance: 57 mL/min (by C-G formula based on SCr of 0.83 mg/dL).  Liver Function  Tests: Recent Labs  Lab 10/28/21 1855  AST 13*  ALT 13  ALKPHOS 131*  BILITOT 0.5  PROT 6.4*  ALBUMIN 4.0     CBG: Recent Labs  Lab 10/29/21 1654 10/29/21 2045 10/29/21 2328 10/30/21 0429 10/30/21 0726  GLUCAP 107* 114* 107* 104* 120*      No results found for this or any previous visit (from the past 240 hour(s)).       Radiology Studies: DG Abd 1 View  Result Date: 10/30/2021 CLINICAL DATA:  Small bowel obstruction EXAM: ABDOMEN - 1 VIEW COMPARISON:  Abdominal radiograph dated October 29, 2021 FINDINGS: Contrast material is in the stomach, unchanged when compared with prior. Gastric decompression tube courses below the diaphragm with tip obscured by contrast material. Mildly dilated loop of small bowel seen in the left hemidiaphragm. Nondilated gas-filled loops of large bowel. IMPRESSION: 1. Contrast material is in the stomach, unchanged when compared with prior. 2. Mildly dilated loop of small bowel seen, compatible with history of SBO. Electronically Signed   By: Allegra Lai M.D.   On: 10/30/2021 08:41   DG Abd  Portable 1V-Small Bowel Obstruction Protocol-initial, 8 hr delay  Result Date: 10/29/2021 CLINICAL DATA:  Small bowel obstruction EXAM: PORTABLE ABDOMEN - 1 VIEW COMPARISON:  CT 10/28/2021 FINDINGS: NG tube is in the stomach. Contrast material is seen in the fundus of the stomach. There appears to be dilute contrast within prominent small bowel loops. No organomegaly or suspicious calcification. No free air. IMPRESSION: Contrast in the stomach. Probable dilute contrast within dilated small bowel loops compatible with small bowel obstruction as seen on prior CT. NG tube tip in the stomach. Electronically Signed   By: Charlett Nose M.D.   On: 10/29/2021 23:26   DG Abd Portable 1V-Small Bowel Protocol-Position Verification  Result Date: 10/29/2021 CLINICAL DATA:  Nasogastric tube placement. EXAM: PORTABLE ABDOMEN - 1 VIEW COMPARISON:  None Available. FINDINGS: A  nasogastric tube is seen with tip overlying the distal gastric body. No evidence of dilated bowel loops within the visualized portion of the abdomen. IMPRESSION: Nasogastric tube tip overlies the distal gastric body. Electronically Signed   By: Danae Orleans M.D.   On: 10/29/2021 13:02   CT ABDOMEN PELVIS W CONTRAST  Result Date: 10/28/2021 CLINICAL DATA:  Acute abdominal pain EXAM: CT ABDOMEN AND PELVIS WITH CONTRAST TECHNIQUE: Multidetector CT imaging of the abdomen and pelvis was performed using the standard protocol following bolus administration of intravenous contrast. RADIATION DOSE REDUCTION: This exam was performed according to the departmental dose-optimization program which includes automated exposure control, adjustment of the mA and/or kV according to patient size and/or use of iterative reconstruction technique. CONTRAST:  OMNIPAQUE IOHEXOL 300 MG/ML  SOLN COMPARISON:  08/26/2013 FINDINGS: Lower chest: No acute abnormality. Hepatobiliary: No focal liver abnormality is seen. No gallstones, gallbladder wall thickening, or biliary dilatation. Pancreas: Unremarkable. No pancreatic ductal dilatation or surrounding inflammatory changes. Spleen: Normal in size without focal abnormality. Adrenals/Urinary Tract: Adrenal glands are within normal limits. Kidneys demonstrate a normal enhancement pattern bilaterally. No renal calculi or obstructive changes are noted. Normal excretion is noted on delayed images. Bladder is within normal limits. Stomach/Bowel: Scattered diverticular changes noted without evidence of diverticulitis. No obstructive or inflammatory changes of the colon are seen. The appendix is within normal limits. Stomach is distended with fluid. The proximal jejunum appears within normal limits. In the region of the distal jejunum and proximal ileum, there are multiple fluid-filled loops of dilated small bowel identified. A transition zone is noted in the right mid abdomen although no  discrete mass is seen. More distal small bowel appears within normal limits. Vascular/Lymphatic: Aortic atherosclerosis. No enlarged abdominal or pelvic lymph nodes. Reproductive: Uterus and bilateral adnexa are unremarkable. Other: No abdominal wall hernia or abnormality. No abdominopelvic ascites. Musculoskeletal: Right hip replacement is noted. Degenerative changes of the lumbar spine are noted. IMPRESSION: Multiple dilated loops of mid small bowel consistent with at least partial small bowel obstruction. A transition zone is noted in the right mid abdomen although no mass is seen. These changes may be related to adhesions. No other focal abnormality is noted. Electronically Signed   By: Alcide Clever M.D.   On: 10/28/2021 20:40        Scheduled Meds:  insulin aspart  0-6 Units Subcutaneous Q4H   Continuous Infusions:  lactated ringers 125 mL/hr at 10/30/21 0329     LOS: 1 day    Time spent: 40 minutes    Ramiro Harvest, MD [facility]   To contact the attending provider between 7A-7P or the covering provider during after hours 7P-7A, please  log into the web site [email or web address] and access using universal [employer] password for that web site. If you do not have the password, please call the hospital operator.  10/30/2021, 11:28 AM

## 2021-10-31 ENCOUNTER — Inpatient Hospital Stay (HOSPITAL_COMMUNITY): Payer: Medicare Other

## 2021-10-31 DIAGNOSIS — E538 Deficiency of other specified B group vitamins: Secondary | ICD-10-CM

## 2021-10-31 DIAGNOSIS — I739 Peripheral vascular disease, unspecified: Secondary | ICD-10-CM | POA: Diagnosis not present

## 2021-10-31 DIAGNOSIS — I10 Essential (primary) hypertension: Secondary | ICD-10-CM | POA: Diagnosis not present

## 2021-10-31 DIAGNOSIS — E1165 Type 2 diabetes mellitus with hyperglycemia: Secondary | ICD-10-CM | POA: Diagnosis not present

## 2021-10-31 DIAGNOSIS — K56600 Partial intestinal obstruction, unspecified as to cause: Secondary | ICD-10-CM | POA: Diagnosis not present

## 2021-10-31 LAB — BASIC METABOLIC PANEL
Anion gap: 7 (ref 5–15)
BUN: 9 mg/dL (ref 8–23)
CO2: 27 mmol/L (ref 22–32)
Calcium: 8.6 mg/dL — ABNORMAL LOW (ref 8.9–10.3)
Chloride: 107 mmol/L (ref 98–111)
Creatinine, Ser: 0.73 mg/dL (ref 0.44–1.00)
GFR, Estimated: >60 mL/min (ref >60)
Glucose, Bld: 105 mg/dL — ABNORMAL HIGH (ref 70–99)
Potassium: 3.7 mmol/L (ref 3.5–5.1)
Sodium: 141 mmol/L (ref 135–145)

## 2021-10-31 LAB — FERRITIN: Ferritin: 43 ng/mL (ref 11–307)

## 2021-10-31 LAB — MAGNESIUM: Magnesium: 2.2 mg/dL (ref 1.7–2.4)

## 2021-10-31 LAB — CBC
HCT: 30.8 % — ABNORMAL LOW (ref 36.0–46.0)
Hemoglobin: 10 g/dL — ABNORMAL LOW (ref 12.0–15.0)
MCH: 30.8 pg (ref 26.0–34.0)
MCHC: 32.5 g/dL (ref 30.0–36.0)
MCV: 94.8 fL (ref 80.0–100.0)
Platelets: 244 10*3/uL (ref 150–400)
RBC: 3.25 MIL/uL — ABNORMAL LOW (ref 3.87–5.11)
RDW: 15 % (ref 11.5–15.5)
WBC: 9.3 10*3/uL (ref 4.0–10.5)
nRBC: 0 % (ref 0.0–0.2)

## 2021-10-31 LAB — GLUCOSE, CAPILLARY
Glucose-Capillary: 102 mg/dL — ABNORMAL HIGH (ref 70–99)
Glucose-Capillary: 104 mg/dL — ABNORMAL HIGH (ref 70–99)
Glucose-Capillary: 104 mg/dL — ABNORMAL HIGH (ref 70–99)

## 2021-10-31 LAB — IRON AND TIBC
Iron: 19 ug/dL — ABNORMAL LOW (ref 28–170)
Saturation Ratios: 7 % — ABNORMAL LOW (ref 10.4–31.8)
TIBC: 256 ug/dL (ref 250–450)
UIBC: 237 ug/dL

## 2021-10-31 LAB — VITAMIN B12: Vitamin B-12: 119 pg/mL — ABNORMAL LOW (ref 180–914)

## 2021-10-31 LAB — FOLATE: Folate: 18.7 ng/mL (ref >5.9)

## 2021-10-31 MED ORDER — POTASSIUM CHLORIDE CRYS ER 10 MEQ PO TBCR: 10.0000 meq | EXTENDED_RELEASE_TABLET | Freq: Two times a day (BID) | ORAL | Status: DC

## 2021-10-31 MED ORDER — VITAMIN B-12 1000 MCG PO TABS: 1000.0000 ug | ORAL_TABLET | Freq: Every day | ORAL | Status: DC

## 2021-10-31 MED ORDER — CYANOCOBALAMIN 1000 MCG/ML IJ SOLN
1000.0000 ug | Freq: Every day | INTRAMUSCULAR | Status: DC
  Administered 2021-10-31: 1000 ug via SUBCUTANEOUS
  Filled 2021-10-31: qty 1

## 2021-10-31 MED ORDER — ONDANSETRON HCL 4 MG PO TABS: 4.0000 mg | ORAL_TABLET | Freq: Four times a day (QID) | ORAL | 0 refills | Status: DC | PRN

## 2021-10-31 MED ORDER — FUROSEMIDE 20 MG PO TABS: 20.0000 mg | ORAL_TABLET | Freq: Every morning | ORAL | 1 refills | Status: DC

## 2021-10-31 NOTE — Plan of Care (Signed)

## 2021-11-08 ENCOUNTER — Ambulatory Visit (INDEPENDENT_AMBULATORY_CARE_PROVIDER_SITE_OTHER): Payer: Medicare Other | Admitting: Podiatry

## 2021-11-08 DIAGNOSIS — G5781 Other specified mononeuropathies of right lower limb: Secondary | ICD-10-CM | POA: Diagnosis not present

## 2021-11-09 ENCOUNTER — Encounter: Payer: Self-pay | Admitting: Podiatry

## 2021-11-09 NOTE — Progress Notes (Signed)
Subjective:  Patient ID: Diane Giles, female    DOB: [birth year],  MRN: [id number]  Chief Complaint  Patient presents with   Tendonitis    66 y.o. female returns for follow-up with the above complaint. History confirmed with patient.  She has been having discomfort between her third and fourth toes its only bothers her when she wears shoes and socks.  Does not like the gout pain she was having before when I saw her.  Little bit better in the summer because she is able to wear open shoes.  Objective:  Physical Exam: warm, good capillary refill, no trophic changes or ulcerative lesions, normal DP and PT pulses, and normal sensory exam. Right foot there is mild tenderness with palpation of the third interspace, no definitive Mulder's click, no Lendell Caprice sign  I reviewed her radiographs from [facility] imaging no acute osseous abnormalities noted there is soft tissue swelling Assessment:   1. 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  Suspect she has a likely neuroma of the third interspace.  We discussed etiology and treatment options of these including injection therapy offloading and surgical excision when this fails.  Currently it is only mildly bothersome and she does better in the summer with open shoes.  She will return later this year when it becomes more bothersome for an injection as colder weather approaches.  Return if symptoms worsen or fail to improve.

## 2021-11-20 ENCOUNTER — Emergency Department (HOSPITAL_BASED_OUTPATIENT_CLINIC_OR_DEPARTMENT_OTHER)
Admission: EM | Admit: 2021-11-20 | Discharge: 2021-11-21 | Disposition: A | Discharge status: Home / Self Care | Payer: Medicare Other | Attending: Emergency Medicine | Admitting: Emergency Medicine

## 2021-11-20 ENCOUNTER — Other Ambulatory Visit: Payer: Self-pay

## 2021-11-20 ENCOUNTER — Encounter (HOSPITAL_BASED_OUTPATIENT_CLINIC_OR_DEPARTMENT_OTHER): Payer: Self-pay

## 2021-11-20 ENCOUNTER — Emergency Department (HOSPITAL_BASED_OUTPATIENT_CLINIC_OR_DEPARTMENT_OTHER): Payer: Medicare Other

## 2021-11-20 ENCOUNTER — Emergency Department (HOSPITAL_COMMUNITY)
Admission: EM | Admit: 2021-11-20 | Discharge: 2021-11-20 | Discharge status: Against Medical Advice | Payer: Medicare Other

## 2021-11-20 DIAGNOSIS — Z7982 Long term (current) use of aspirin: Secondary | ICD-10-CM | POA: Insufficient documentation

## 2021-11-20 DIAGNOSIS — R1084 Generalized abdominal pain: Secondary | ICD-10-CM | POA: Diagnosis present

## 2021-11-20 DIAGNOSIS — I10 Essential (primary) hypertension: Secondary | ICD-10-CM | POA: Diagnosis not present

## 2021-11-20 DIAGNOSIS — E119 Type 2 diabetes mellitus without complications: Secondary | ICD-10-CM | POA: Insufficient documentation

## 2021-11-20 DIAGNOSIS — K56600 Partial intestinal obstruction, unspecified as to cause: Secondary | ICD-10-CM | POA: Diagnosis not present

## 2021-11-20 LAB — COMPREHENSIVE METABOLIC PANEL
ALT: 15 U/L (ref 0–44)
AST: 15 U/L (ref 15–41)
Albumin: 4.9 g/dL (ref 3.5–5.0)
Alkaline Phosphatase: 147 U/L — ABNORMAL HIGH (ref 38–126)
Anion gap: 14 (ref 5–15)
BUN: 18 mg/dL (ref 8–23)
CO2: 28 mmol/L (ref 22–32)
Calcium: 11 mg/dL — ABNORMAL HIGH (ref 8.9–10.3)
Chloride: 100 mmol/L (ref 98–111)
Creatinine, Ser: 0.89 mg/dL (ref 0.44–1.00)
GFR, Estimated: >60 mL/min (ref >60)
Glucose, Bld: 131 mg/dL — ABNORMAL HIGH (ref 70–99)
Potassium: 4 mmol/L (ref 3.5–5.1)
Sodium: 142 mmol/L (ref 135–145)
Total Bilirubin: 0.7 mg/dL (ref 0.3–1.2)
Total Protein: 7.3 g/dL (ref 6.5–8.1)

## 2021-11-20 LAB — CBC WITH DIFFERENTIAL/PLATELET
Abs Immature Granulocytes: 0.05 10*3/uL (ref 0.00–0.07)
Basophils Absolute: 0 10*3/uL (ref 0.0–0.1)
Basophils Relative: 0 %
Eosinophils Absolute: 0.2 10*3/uL (ref 0.0–0.5)
Eosinophils Relative: 1 %
HCT: 37.1 % (ref 36.0–46.0)
Hemoglobin: 12.5 g/dL (ref 12.0–15.0)
Immature Granulocytes: 0 %
Lymphocytes Relative: 9 %
Lymphs Abs: 1.4 10*3/uL (ref 0.7–4.0)
MCH: 31.3 pg (ref 26.0–34.0)
MCHC: 33.7 g/dL (ref 30.0–36.0)
MCV: 92.8 fL (ref 80.0–100.0)
Monocytes Absolute: 0.6 10*3/uL (ref 0.1–1.0)
Monocytes Relative: 4 %
Neutro Abs: 13 10*3/uL — ABNORMAL HIGH (ref 1.7–7.7)
Neutrophils Relative %: 86 %
Platelets: 325 10*3/uL (ref 150–400)
RBC: 4 MIL/uL (ref 3.87–5.11)
RDW: 15.3 % (ref 11.5–15.5)
WBC: 15.3 10*3/uL — ABNORMAL HIGH (ref 4.0–10.5)
nRBC: 0 % (ref 0.0–0.2)

## 2021-11-20 LAB — LIPASE, BLOOD: Lipase: 26 U/L (ref 11–51)

## 2021-11-20 LAB — LACTIC ACID, PLASMA: Lactic Acid, Venous: 1 mmol/L (ref 0.5–1.9)

## 2021-11-20 MED ORDER — LACTATED RINGERS IV BOLUS
1000.0000 mL | Freq: Once | INTRAVENOUS | Status: AC
  Administered 2021-11-20: 1000 mL via INTRAVENOUS

## 2021-11-20 MED ORDER — HYDROCODONE-ACETAMINOPHEN 5-325 MG PO TABS: 1.0000 | ORAL_TABLET | Freq: Four times a day (QID) | ORAL | 0 refills | Status: DC | PRN

## 2021-11-20 MED ORDER — FENTANYL CITRATE PF 50 MCG/ML IJ SOSY
50.0000 ug | PREFILLED_SYRINGE | INTRAMUSCULAR | Status: DC | PRN
  Administered 2021-11-20: 50 ug via INTRAVENOUS
  Filled 2021-11-20: qty 1

## 2021-11-20 MED ORDER — ONDANSETRON HCL 4 MG/2ML IJ SOLN
4.0000 mg | Freq: Once | INTRAMUSCULAR | Status: AC
  Administered 2021-11-20: 4 mg via INTRAVENOUS
  Filled 2021-11-20: qty 2

## 2021-11-20 MED ORDER — ONDANSETRON 4 MG PO TBDP: 4.0000 mg | ORAL_TABLET | Freq: Three times a day (TID) | ORAL | 0 refills | Status: DC | PRN

## 2021-11-20 MED ORDER — HYDROMORPHONE HCL 1 MG/ML IJ SOLN
1.0000 mg | Freq: Once | INTRAMUSCULAR | Status: AC
  Administered 2021-11-20: 1 mg via INTRAVENOUS
  Filled 2021-11-20: qty 1

## 2021-11-20 MED ORDER — ONDANSETRON HCL 4 MG/2ML IJ SOLN: 4.0000 mg | Freq: Three times a day (TID) | INTRAMUSCULAR | Status: DC | PRN

## 2021-11-20 MED ORDER — IOHEXOL 300 MG/ML  SOLN
100.0000 mL | Freq: Once | INTRAMUSCULAR | Status: AC | PRN
  Administered 2021-11-20: 100 mL via INTRAVENOUS

## 2021-11-20 NOTE — ED Notes (Signed)
Pt awake and alert - GCS 15.  Reports normal bowel movements and denies vomiting; last BM day of arrival.  Pt initially reports 0/10 pain since Fentanyl administration however when this nurse is preparing to set of bolus pt states pain is returning -- '1mg'$  IVP Dilaudid subsequently administered (see MAR).  Pt reports nausea earlier but nausea is controlled.  RR even and unlabored on RA with stable O2 sats however after Dilaudid administration patient observed to desat to high 80s (87-88%)-- pt subsequently placed on 2L O2 via [place] with improvement to 100%.

## 2021-11-20 NOTE — ED Notes (Signed)
Pt's husband states that he is going to take her to [facility]

## 2021-11-20 NOTE — ED Notes (Signed)
Dr. Armandina Gemma now at bedside to re-eval  after sudden onset vomiting

## 2021-11-20 NOTE — ED Provider Notes (Signed)
[facility] EMERGENCY DEPT Provider Note   CSN: [id number] Arrival date & time: 11/20/21  1836     History {Add pertinent medical, surgical, social history, OB history to HPI:1} Chief Complaint  Patient presents with   Abdominal Pain    Diane Giles is a 66 y.o. female.   Abdominal Pain      Home Medications Prior to Admission medications   Medication Sig Start Date End Date Taking? Authorizing Provider  acetaminophen (TYLENOL) 500 MG tablet Take 1,000 mg by mouth every 6 (six) hours as needed for mild pain.    [provider]  allopurinol (ZYLOPRIM) 100 MG tablet Take 300 mg by mouth daily.    [provider]  aspirin EC 81 MG tablet Take 1 tablet (81 mg total) by mouth 2 (two) times daily. To be taken after surgery to prevent blood clots Patient taking differently: Take 81 mg by mouth daily. 08/21/21   Aundra Dubin, PA-C  atenolol (TENORMIN) 50 MG tablet Take 50 mg by mouth daily. 10/13/21   [provider]  atorvastatin (LIPITOR) 80 MG tablet Take 1 tablet (80 mg total) by mouth daily at 6 PM. 08/16/17   Shawnee Knapp, MD  clobetasol cream (TEMOVATE) 7.10 % Apply 1 application topically 2 (two) times daily as needed (rash). 10/13/19   [provider]  colchicine 0.6 MG tablet Take 1.'2mg'$  (2 tablets) then 0.'6mg'$  (1 tablet) 1 hour after. Then, take 1 tablet every day for 7 days. Patient taking differently: Take 0.6-1.2 mg by mouth See admin instructions. Take 1.'2mg'$  (2 tablets) then 0.'6mg'$  (1 tablet) 1 hour after. Then, take 1 tablet every day for 7 days. AS NEEDED 01/06/21   Criselda Peaches, DPM  furosemide (LASIX) 20 MG tablet Take 1 tablet (20 mg total) by mouth every morning. 11/02/21   Eugenie Filler, MD  Lancets MISC Use as directed once a day. (DX: R73.9) 01/20/15   Shawnee Knapp, MD  losartan (COZAAR) 25 MG tablet TAKE 1 TABLET IN THE P.M. Patient taking differently: Take 12.5 mg by mouth every evening. 08/16/17   Shawnee Knapp,  MD  magnesium oxide (MAG-OX) 400 MG tablet Take 400 mg by mouth every evening. 10/13/21   [provider]  metFORMIN (GLUCOPHAGE) 500 MG tablet TAKE 2 TABLETS TWICE DAILY WITH MEALS. Patient taking differently: Take 500 mg by mouth 2 (two) times daily with a meal. 08/16/17   Shawnee Knapp, MD  ondansetron (ZOFRAN) 4 MG tablet Take 1 tablet (4 mg total) by mouth every 6 (six) hours as needed for nausea. 10/31/21   Eugenie Filler, MD  OZEMPIC, 0.25 OR 0.5 MG/DOSE, 2 MG/3ML SOPN Inject 0.5 mg into the skin every Monday. Patient not taking: Reported on 10/30/2021 08/10/21   [provider]  potassium chloride (KLOR-CON M) 10 MEQ tablet Take 1 tablet (10 mEq total) by mouth 2 (two) times daily. 11/02/21   Eugenie Filler, MD  pregabalin (LYRICA) 75 MG capsule Take 75 mg by mouth 2 (two) times daily. 01/01/20   [provider]  vitamin B-12 (CYANOCOBALAMIN) 1000 MCG tablet Take 1 tablet (1,000 mcg total) by mouth daily. 10/31/21   Eugenie Filler, MD      Allergies    Codeine    Review of Systems   Review of Systems  Gastrointestinal:  Positive for abdominal pain.    Physical Exam Updated Vital Signs BP (!) 145/47 (BP Location: Left Arm)   Pulse 60  Temp 98.1 F (36.7 C) (Oral)   Resp 18   LMP 05/07/2005 (Approximate)   SpO2 100%  Physical Exam  ED Results / Procedures / Treatments   Labs (all labs ordered are listed, but only abnormal results are displayed) Labs Reviewed  COMPREHENSIVE METABOLIC PANEL - Abnormal; Notable for the following components:      Result Value   Glucose, Bld 131 (*)    Calcium 11.0 (*)    Alkaline Phosphatase 147 (*)    All other components within normal limits  CBC WITH DIFFERENTIAL/PLATELET - Abnormal; Notable for the following components:   WBC 15.3 (*)    Neutro Abs 13.0 (*)    All other components within normal limits  LIPASE, BLOOD  URINALYSIS, ROUTINE W REFLEX MICROSCOPIC    EKG None  Radiology No results  found.  Procedures Procedures  {Document cardiac monitor, telemetry assessment procedure when appropriate:1}  Medications Ordered in ED Medications  fentaNYL (SUBLIMAZE) injection 50 mcg (50 mcg Intravenous Given 11/20/21 1929)  ondansetron (ZOFRAN) injection 4 mg (4 mg Intravenous Given 11/20/21 1931)    ED Course/ Medical Decision Making/ A&P                           Medical Decision Making Amount and/or Complexity of Data Reviewed Labs: ordered.  Risk Prescription drug management.   ***  {Document critical care time when appropriate:1} {Document review of labs and clinical decision tools ie heart score, Chads2Vasc2 etc:1}  {Document your independent review of radiology images, and any outside records:1} {Document your discussion with family members, caretakers, and with consultants:1} {Document social determinants of health affecting pt's care:1} {Document your decision making why or why not admission, treatments were needed:1} Final Clinical Impression(s) / ED Diagnoses Final diagnoses:  None    Rx / DC Orders ED Discharge Orders     None

## 2021-11-20 NOTE — Discharge Instructions (Signed)
CT Abdomen Results: IMPRESSION:  1. Fecalization of the small bowel which may be associated with  chronic partial small bowel obstruction. No acute bowel obstruction  is identified.  2. Aortic atherosclerosis.  3. Small amount of free fluid in the cul-de-sac.   Recommend slowly advancing your diet. Will prescribe nausea and pain medicine. Discussed admission for observation vs discharge. Return for admission for NG placement in the event of worsening symptoms.

## 2021-11-20 NOTE — ED Triage Notes (Signed)
Pt arrives POV from home with her husband.  Pt here on 10/2521 with bowel obstruction.  Presents today with same abdominal pain.  Reports some nausea, but no vomiting.  Had a bowel movement this am that was normal.  BIB wheelchair.  Pt very tearful in triage due to pain.  GCS 15.

## 2021-11-20 NOTE — ED Notes (Signed)
Pt awake and alert- O2 sats remain stable 100% after nasal cannula removed.  While reviewing d/c instructions pt became suddenly nauseous and had emesis episode (small amount liquid noted in emesis bag)

## 2021-12-03 ENCOUNTER — Encounter: Payer: Self-pay | Admitting: Orthopaedic Surgery

## 2021-12-04 NOTE — Telephone Encounter (Signed)
Thank you :)

## 2022-01-03 ENCOUNTER — Ambulatory Visit: Payer: Medicare Other | Admitting: Cardiology

## 2022-01-03 ENCOUNTER — Encounter: Payer: Self-pay | Admitting: Cardiology

## 2022-01-03 VITALS — BP 152/82 | HR 64 | Temp 98.2°F | Resp 16 | Ht <= 58 in | Wt 159.0 lb

## 2022-01-03 DIAGNOSIS — I10 Essential (primary) hypertension: Secondary | ICD-10-CM

## 2022-01-03 DIAGNOSIS — Z01818 Encounter for other preprocedural examination: Secondary | ICD-10-CM

## 2022-01-03 DIAGNOSIS — I739 Peripheral vascular disease, unspecified: Secondary | ICD-10-CM

## 2022-01-03 DIAGNOSIS — E7800 Pure hypercholesterolemia, unspecified: Secondary | ICD-10-CM

## 2022-01-03 MED ORDER — OLMESARTAN MEDOXOMIL-HCTZ 40-25 MG PO TABS: 1.0000 | ORAL_TABLET | Freq: Every day | ORAL | 2 refills | Status: DC

## 2022-01-03 NOTE — Progress Notes (Signed)
Primary Physician/Referring:  Lula Olszewski, MD  Patient ID: Diane Giles, female    DOB: Dec 05, 1955, 66 y.o.   MRN: [id number]  Chief Complaint  Patient presents with   Medical Clearance    Rt Knee replacement    New Patient (Initial Visit)   HPI:    Diane Giles  is a 66 y.o. 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  She has a 20-pack-year history of smoking quit in 2018.  Referred to me for evaluation of heart risk stratification for right TKA.  Patient in spite of severe degenerative joint disease, has been exercising regularly in the gym without chest pain or dyspnea, she also has a pool at home which she gets into without difficulty.   Past Medical History:  Diagnosis Date   Aortic stenosis    mild-moderate AS 04/22/19 echo   Aortoiliac occlusive disease (HCC) 12/03/2016   Arthritis    Diabetes mellitus without complication (HCC)    per pt she is pre- diabetic   Diverticulitis    Gout    Heart murmur    Hypertension    Osteoporosis    Sleep apnea    Tobacco use disorder 07/04/2012   Past Surgical History:  Procedure Laterality Date   ABDOMINAL AORTOGRAM W/LOWER EXTREMITY N/A 12/03/2016   Procedure: Abdominal Aortogram w/Lower Extremity;  Surgeon: Chuck Hint, MD;  Location[facility] INVASIVE CV LAB;  Service: Cardiovascular;  Laterality: N/A;   AORTA -INNOMIATE BYPASS N/A 05/18/2019   Procedure: AORTA -INNOMIATE BYPASS Using Hemashield Gold Graft Size 8mm;  Surgeon: Alleen Borne, MD;  Location: [facility] OR;  Service: Open Heart Surgery;  Laterality: N/A;   DILATATION & CURETTAGE/HYSTEROSCOPY WITH  MYOSURE N/A 12/27/2015   Procedure: DILATATION & CURETTAGE/HYSTEROSCOPY;  Surgeon: Patton Salles, MD;  Location: [facility] ORS;  Service: Gynecology;  Laterality: N/A;   DILATION AND CURETTAGE OF UTERUS  11/2015   ELBOW SURGERY Left ~2007   tendon repair by Dr. Ranell Patrick   EYE SURGERY Bilateral 2008   lasik   JOINT REPLACEMENT     lower aortic bypass  2000   per patient   PERIPHERAL VASCULAR INTERVENTION  12/03/2016   Procedure: Peripheral Vascular Intervention;  Surgeon: Chuck Hint, MD;  Location: [facility] INVASIVE CV LAB;  Service: Cardiovascular;;   Right innonimate bypass  05/18/2019   orto innominate bypass surgery with median sternotomy   STERNOTOMY  05/18/2019   Right aorto-innonimate bypass surgery 05/18/19 Dr. Laneta Simmers   TOTAL HIP ARTHROPLASTY Right 07/27/2014   Procedure: RIGHT TOTAL HIP ARTHROPLASTY ANTERIOR APPROACH;  Surgeon: Durene Romans, MD;  Location: [facility] ORS;  Service: Orthopedics;  Laterality: Right;   Family History  Problem Relation Age of Onset   Migraines Mother    Thyroid disease Mother        hypothyroid   Cancer Father 65       Dec age 77 with pancreatic Ca    Social History   Tobacco Use   Smoking status: Former    Packs/day: 0.50    Years: 40.00    Total pack years: 20.00    Types: Cigarettes    Quit date: 02/23/2017    Years since quitting:  4.8   Smokeless tobacco: Never  Substance Use Topics   Alcohol use: Yes    Comment: very rare--1/month   Marital Status: Married  ROS  Review of Systems  Cardiovascular:  Negative for chest pain, dyspnea on exertion and leg swelling.   Objective      01/03/2022    2:15 PM 01/03/2022    1:33 PM 01/03/2022    1:21 PM  Vitals with BMI  Height   4\' 10"   Weight   159 lbs  BMI   33.24  Systolic 152 152 563  Diastolic 82 81 87  Pulse  64 87   Today's Vitals   01/03/22 1321 01/03/22 1333 01/03/22 1415  BP: (!) 187/87 (!) 152/81 (!) 152/82  Pulse: 87 64   Resp: 16    Temp: 98.2 F (36.8 C)     SpO2: 97% 97%   Weight: 159 lb (72.1 kg)    Height: 4\' 10"  (1.473 m)     Body mass index is 33.23 kg/m.   Physical Exam Neck:     Vascular: No carotid bruit or JVD.  Cardiovascular:     Rate and Rhythm: Normal rate and regular rhythm.     Pulses:          Carotid pulses are  on the right side with bruit and  on the left side with bruit.      Radial pulses are 2+ on the right side and 2+ on the left side.       Femoral pulses are 2+ on the right side and 2+ on the left side.      Popliteal pulses are 2+ on the right side and 2+ on the left side.       Dorsalis pedis pulses are 0 on the right side and 0 on the left side.       Posterior tibial pulses are 0 on the right side and 0 on the left side.     Heart sounds: Murmur heard.     Midsystolic murmur is present with a grade of 2/6 at the upper right sternal border radiating to the neck.     No gallop.  Pulmonary:     Effort: Pulmonary effort is normal.     Breath sounds: Normal breath sounds.  Abdominal:     General: Bowel sounds are normal.     Palpations: Abdomen is soft.  Musculoskeletal:     Right lower leg: No edema.     Left lower leg: No edema.     Medications and allergies   Allergies  Allergen Reactions   Codeine Itching     Medication list after today's encounter   Current Outpatient Medications:    allopurinol (ZYLOPRIM) 100 MG tablet, Take 300 mg by mouth daily., Disp: , Rfl:    aspirin EC 81 MG tablet, Take 1 tablet (81 mg total) by mouth 2 (two) times daily. To be taken after surgery to prevent blood clots (Patient taking differently: Take 81 mg by mouth daily.), Disp: 84 tablet, Rfl: 0   atorvastatin (LIPITOR) 80 MG tablet, Take 1 tablet (80 mg total) by mouth daily at 6 PM., Disp: 90 tablet, Rfl: 3   clobetasol cream (TEMOVATE) 0.05 %, Apply 1 application topically 2 (two) times daily as needed (rash)., Disp: , Rfl:    colchicine 0.6 MG tablet, Take 1.2mg  (2 tablets) then 0.6mg  (1 tablet) 1 hour after.  Then, take 1 tablet every day for 7 days. (Patient taking differently: Take  0.6-1.2 mg by mouth See admin instructions. Take 1.2mg  (2 tablets) then 0.6mg  (1 tablet) 1 hour after. Then, take 1 tablet every day for 7 days. AS NEEDED), Disp: 10 tablet, Rfl: 2   magnesium oxide (MAG-OX) 400 MG tablet, Take 400 mg by mouth every evening., Disp: , Rfl:    metFORMIN (GLUCOPHAGE) 500 MG tablet, TAKE 2 TABLETS TWICE DAILY WITH MEALS. (Patient taking differently: Take 500 mg by mouth 2 (two) times daily with a meal.), Disp: 360 tablet, Rfl: 3   olmesartan-hydrochlorothiazide (BENICAR HCT) 40-25 MG tablet, Take 1 tablet by mouth daily., Disp: 30 tablet, Rfl: 2   pregabalin (LYRICA) 75 MG capsule, Take 75 mg by mouth 2 (two) times daily., Disp: , Rfl:    atenolol (TENORMIN) 50 MG tablet, Take 50 mg by mouth daily., Disp: , Rfl:    Lancets MISC, Use as directed once a day. (DX: R73.9), Disp: 100 each, Rfl: 2  Laboratory examination:   Lab Results  Component Value Date   NA 142 11/20/2021   K 4.0 11/20/2021   CO2 28 11/20/2021   GLUCOSE 131 (H) 11/20/2021   BUN 18 11/20/2021   CREATININE 0.89 11/20/2021   CALCIUM 11.0 (H) 11/20/2021   EGFR 67 01/05/2021   GFRNONAA >60 11/20/2021       Latest Ref Rng & Units 11/20/2021    7:48 PM 10/31/2021    1:46 AM 10/30/2021    1:40 AM  BMP  Glucose 70 - 99 mg/dL 161  096  045   BUN 8 - 23 mg/dL 18  9  12    Creatinine 0.44 - 1.00 mg/dL 4.09  8.11  9.14   Sodium 135 - 145 mmol/L 142  141  141   Potassium 3.5 - 5.1 mmol/L 4.0  3.7  4.1   Chloride 98 - 111 mmol/L 100  107  103   CO2 22 - 32 mmol/L 28  27  30    Calcium 8.9 - 10.3 mg/dL 78.2  8.6  9.1       Latest Ref Rng & Units 11/20/2021    7:48 PM 10/28/2021    6:55 PM 08/21/2021    9:24 AM  Hepatic Function  Total Protein 6.5 - 8.1 g/dL 7.3  6.4  6.8   Albumin 3.5 - 5.0 g/dL 4.9  4.0  3.4   AST 15 - 41 U/L 15  13  21    ALT 0 - 44 U/L 15  13  16    Alk Phosphatase 38 - 126 U/L 147  131  154   Total  Bilirubin 0.3 - 1.2 mg/dL 0.7  0.5  0.5     Lipid Panel No results for input(s): "CHOL", "TRIG", "LDLCALC", "VLDL", "HDL", "CHOLHDL", "LDLDIRECT" in the last 8760 hours.  HEMOGLOBIN A1C Lab Results  Component Value Date   HGBA1C 5.5 10/29/2021   MPG 111.15 10/29/2021   TSH No results for input(s): "TSH" in the last 8760 hours.  Radiology:    Cardiac Studies:   Coronary CT angiogram 05-01-2019: 1. Coronary calcium score of 140. This was 60 percentile for age and sex matched control. 2. Normal coronary origin with left dominance. 3. CAD-RADS 2. Mild non-obstructive CAD (25-49%) in the proximal LAD. Consider preventive therapy and risk factor modification.  Echocardiogram 12/28/2020: 1. Left ventricular ejection fraction, by estimation, is 70 to 75%. The left ventricle has hyperdynamic function. The left ventricle has no regional wall motion abnormalities. Left ventricular diastolic parameters are indeterminate.  2. Right ventricular systolic function  is normal. The right ventricular size is normal. There is normal pulmonary artery systolic pressure.  3. The mitral valve is normal in structure. No evidence of mitral valve regurgitation. No evidence of mitral stenosis.  4. The aortic valve is calcified. Aortic valve regurgitation is not visualized. Mild aortic valve stenosis.  ABI 10/26/2021: +-------+-----------+-----------+------------+------------+  ABI/TBIToday's ABIToday's TBIPrevious ABIPrevious TBI  +-------+-----------+-----------+------------+------------+  Right  1.25       0.93       1.00        0.49          +-------+-----------+-----------+------------+------------+  Left   1.04       0.75       0.95        0.61          +-------+-----------+-----------+------------+------------+   Carotid artery duplex 10/26/2021: Bilateral 1-39% ICA proximal stenosis with heterogenous plaque. Antegrade flow bilateral vertebral arteries.  Normal flow hemodynamics in  the subclavian arteries.  EKG:   EKG 01/03/2022: Normal sinus rhythm at rate of 59 bpm, normal EKG.    Assessment     ICD-10-CM   1. Pre-op evaluation Glee Arvin, MD for right TKA  Z01.818 EKG 12-Lead    2. PAD (peripheral artery disease) (HCC)  I73.9     3. Pure hypercholesterolemia  E78.00     4. Primary hypertension  I10 olmesartan-hydrochlorothiazide (BENICAR HCT) 40-25 MG tablet       Orders Placed This Encounter  Procedures   EKG 12-Lead    Meds ordered this encounter  Medications   olmesartan-hydrochlorothiazide (BENICAR HCT) 40-25 MG tablet    Sig: Take 1 tablet by mouth daily.    Dispense:  30 tablet    Refill:  2    Discontinue furosemide and also losartan.    Medications Discontinued During This Encounter  Medication Reason   HYDROcodone-acetaminophen (NORCO/VICODIN) 5-325 MG tablet    potassium chloride (KLOR-CON M) 10 MEQ tablet    vitamin B-12 (CYANOCOBALAMIN) 1000 MCG tablet    OZEMPIC, 0.25 OR 0.5 MG/DOSE, 2 MG/3ML SOPN    ondansetron (ZOFRAN-ODT) 4 MG disintegrating tablet    furosemide (LASIX) 20 MG tablet Change in therapy   losartan (COZAAR) 25 MG tablet Change in therapy     Recommendations:   Marche C Portier is a 66 y.o.  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  She has a 20-pack-year history of smoking quit in 2018.  Referred to me for evaluation of heart risk stratification for right TKA.  Patient in spite of severe degenerative joint disease, has been exercising regularly in the gym without chest pain or dyspnea, she also has a pool at home which she gets into without difficulty.  I  reviewed her coronary CT angiogram, she has mild disease.  I reviewed her vascular surgical history, extensive surgery with regard to peripheral arterial disease.  Fortunately she has bounding pulses in the femoral artery and also popliteal arteries although I could not feel pedal pulses, ABI recently has been normal.  Upper extremity pulses are equal and blood pressure is equal.  She was hypertensive today.  We will discontinue losartan which she is only on 25 mg and also discontinue furosemide and switch  her to olmesartan HCT 40/25 mg in the morning.  She has no known renal artery stenosis during vascular angiography that was performed recently.  She has been scheduled for right knee replacement soon next month and 2 to 3 weeks which I believe she can undergo at low risk.  She is on high intensity statins, she is also on a beta-blocker along with aspirin 81 mg daily.  I would like to see her back in 2 months for follow-up of hypertension, she probably needs LPA to be drawn to see if she has high LPA levels with regard to hyperlipidemia.  This was a 60-minute office visit encounter in evaluation of external records, reconciliation of her surgical history and making complex additions.  We will send surgical stratification note to Dr. Sherrian Divers, MD, [facility] 01/03/2022, 2:32 PM Office: [phone]

## 2022-01-18 LAB — HM COLONOSCOPY

## 2022-02-02 ENCOUNTER — Telehealth: Payer: Self-pay | Admitting: Orthopaedic Surgery

## 2022-02-02 NOTE — Telephone Encounter (Signed)
Patient called to cancel right total knee 02-26-22.  She had a place on her right outer leg near calf muscle cut out on 01-31-22 by Dr. Janan Ridge. She would like to speak to discuss another surgery date for early January.  Please call to discuss the length of time to wait.  [phone]

## 2022-02-02 NOTE — Telephone Encounter (Signed)
Once the place has healed she can have surgery but probably a good idea to let me look at it before we schedule her.

## 2022-02-05 NOTE — Telephone Encounter (Signed)
Patient is postponing surgery and will have Dr. 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   Appointment will be make for a later date for patient to come in and see Dr. Erlinda Hong prior to surgery and obtain updated xrays of the right knee.

## 2022-02-13 ENCOUNTER — Ambulatory Visit: Payer: Medicare Other

## 2022-02-14 ENCOUNTER — Other Ambulatory Visit (HOSPITAL_COMMUNITY): Payer: Medicare Other

## 2022-02-21 ENCOUNTER — Ambulatory Visit: Payer: Medicare Other | Admitting: Cardiology

## 2022-02-21 ENCOUNTER — Encounter: Payer: Self-pay | Admitting: Cardiology

## 2022-02-21 VITALS — BP 129/77 | HR 65 | Temp 97.3°F | Resp 16 | Ht <= 58 in | Wt 166.8 lb

## 2022-02-21 DIAGNOSIS — E7800 Pure hypercholesterolemia, unspecified: Secondary | ICD-10-CM

## 2022-02-21 DIAGNOSIS — I7409 Other arterial embolism and thrombosis of abdominal aorta: Secondary | ICD-10-CM

## 2022-02-21 DIAGNOSIS — I739 Peripheral vascular disease, unspecified: Secondary | ICD-10-CM

## 2022-02-21 DIAGNOSIS — I10 Essential (primary) hypertension: Secondary | ICD-10-CM

## 2022-02-21 DIAGNOSIS — I350 Nonrheumatic aortic (valve) stenosis: Secondary | ICD-10-CM

## 2022-02-21 MED ORDER — OLMESARTAN MEDOXOMIL-HCTZ 40-25 MG PO TABS: 1.0000 | ORAL_TABLET | ORAL | 3 refills | Status: DC

## 2022-02-21 NOTE — Progress Notes (Addendum)
Primary Physician/Referring:  Lula Olszewski, MD  Patient ID: Diane Giles, female    DOB: 1956-02-16, 66 y.o.   MRN: [id number]  Chief Complaint  Patient presents with   Hypertension   PAD   Follow-up   HPI:    Diane Giles  is a 66 y.o. 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    he was scheduled for right knee arthroplasty by Dr. Glee Arvin.  Because she was fairly active without any chest pain or dyspnea, I had felt that her cardiac risk was low risk.  Patient in the interim has been diagnosed with skin cancer underwent surgery and has not been able to walk due to surgery on her leg.  She also had an episode of acute intestinal obstruction related to adhesions hence her knee replacement has been postponed for now.  Since surgery, patient states that she has not been able to go back to exercise and has been gaining weight.  She also discontinued Ozempic due to cost and she plans to restart this in January.  Past Medical History:  Diagnosis Date   Aortic stenosis    mild-moderate AS 04/22/19 echo   Aortoiliac occlusive disease (HCC) 12/03/2016   Arthritis    Diabetes mellitus without complication (HCC)    per pt she is pre- diabetic   Diverticulitis    Gout    Heart murmur    Hyperlipidemia    Hypertension    Osteoporosis    Sleep apnea    Tobacco use disorder 07/04/2012   Past Surgical History:  Procedure Laterality Date   ABDOMINAL AORTOGRAM W/LOWER EXTREMITY N/A 12/03/2016   Procedure: Abdominal Aortogram w/Lower Extremity;  Surgeon: Chuck Hint,  MD;  Location: [facility] INVASIVE CV LAB;  Service: Cardiovascular;  Laterality: N/A;   AORTA -INNOMIATE BYPASS N/A 05/18/2019   Procedure: AORTA -INNOMIATE BYPASS Using Hemashield Gold Graft Size 8mm;  Surgeon: Alleen Borne, MD;  Location: [facility] OR;  Service: Open Heart Surgery;  Laterality: N/A;   aorto- fem bypass Bilateral    aortobiiliac bypass in 2000   DILATATION & CURETTAGE/HYSTEROSCOPY WITH MYOSURE N/A 12/27/2015   Procedure: DILATATION & CURETTAGE/HYSTEROSCOPY;  Surgeon: Patton Salles, MD;  Location: [facility]S;  Service: Gynecology;  Laterality: N/A;   DILATION AND CURETTAGE OF UTERUS  11/2015   ELBOW SURGERY Left ~2007   tendon repair by Dr. Ranell Patrick   EYE SURGERY Bilateral 2008   lasik   JOINT REPLACEMENT     lower aortic bypass  2000   per patient   PERIPHERAL VASCULAR INTERVENTION  12/03/2016   Stenting of anastomotic stenosis of the aortoiliac and right common iliac artery with 7 x 39 mm VBX covered stent and postdilated with 9 mm balloon.   Right innonimate bypass  05/18/2019   Aorto innominate bypass surgery with median sternotomy   STERNOTOMY  05/18/2019   Right aorto-innonimate bypass surgery 05/18/19 Dr. Laneta Simmers   TOTAL HIP ARTHROPLASTY Right 07/27/2014   Procedure: RIGHT TOTAL HIP ARTHROPLASTY ANTERIOR APPROACH;  Surgeon: Durene Romans, MD;  Location: [facility];  Service: Orthopedics;  Laterality: Right;   Family History  Problem Relation Age of  Onset   Migraines Mother    Thyroid disease Mother        hypothyroid   Cancer Father 71       Dec age 80 with pancreatic Ca    Social History   Tobacco Use   Smoking status: Former    Packs/day: 0.50    Years: 40.00    Total pack years: 20.00    Types: Cigarettes    Quit date: 02/23/2017    Years since quitting: 5.0   Smokeless tobacco: Never  Substance Use Topics   Alcohol use: Yes    Comment: very rare--1/month   Marital Status: Married  ROS  Review of Systems  Cardiovascular:  Negative for chest pain,  dyspnea on exertion and leg swelling.   Objective      02/21/2022    2:11 PM 01/03/2022    2:15 PM 01/03/2022    1:33 PM  Vitals with BMI  Height 4\' 10"     Weight 166 lbs 13 oz    BMI 34.87    Systolic 129 152 725  Diastolic 77 82 81  Pulse 65  64   Today's Vitals   02/21/22 1411  BP: 129/77  Pulse: 65  Resp: 16  Temp: (!) 97.3 F (36.3 C)  TempSrc: Temporal  SpO2: 96%  Weight: 166 lb 12.8 oz (75.7 kg)  Height: 4\' 10"  (1.473 m)   Body mass index is 34.86 kg/m.   Physical Exam Neck:     Vascular: No carotid bruit or JVD.  Cardiovascular:     Rate and Rhythm: Normal rate and regular rhythm.     Pulses:          Carotid pulses are  on the right side with bruit and  on the left side with bruit.      Radial pulses are 2+ on the right side and 2+ on the left side.       Femoral pulses are 2+ on the right side and 2+ on the left side.      Popliteal pulses are 2+ on the right side and 2+ on the left side.       Dorsalis pedis pulses are 0 on the right side and 0 on the left side.       Posterior tibial pulses are 0 on the right side and 0 on the left side.     Heart sounds: Murmur heard.     Midsystolic murmur is present with a grade of 2/6 at the upper right sternal border radiating to the neck.     No gallop.  Pulmonary:     Effort: Pulmonary effort is normal.     Breath sounds: Normal breath sounds.  Abdominal:     General: Bowel sounds are normal.     Palpations: Abdomen is soft.  Musculoskeletal:     Right lower leg: No edema.     Left lower leg: No edema.     Medications and allergies   Allergies  Allergen Reactions   Codeine Itching     Medication list after today's encounter   Current Outpatient Medications:    allopurinol (ZYLOPRIM) 100 MG tablet, Take 300 mg by mouth daily., Disp: , Rfl:    aspirin 81 MG chewable tablet, Chew 81 mg by mouth daily., Disp: , Rfl:    atenolol (TENORMIN) 50 MG tablet, Take 50 mg by mouth daily., Disp: , Rfl:     atorvastatin (LIPITOR) 80 MG tablet, Take 1 tablet (80 mg total)  by mouth daily at 6 PM., Disp: 90 tablet, Rfl: 3   clobetasol cream (TEMOVATE) 0.05 %, Apply 1 application topically 2 (two) times daily as needed (rash)., Disp: , Rfl:    colchicine 0.6 MG tablet, Take 1.2mg  (2 tablets) then 0.6mg  (1 tablet) 1 hour after. Then, take 1 tablet every day for 7 days. (Patient taking differently: Take 0.6-1.2 mg by mouth See admin instructions. Take 1.2mg  (2 tablets) then 0.6mg  (1 tablet) 1 hour after. Then, take 1 tablet every day for 7 days. AS NEEDED), Disp: 10 tablet, Rfl: 2   ezetimibe (ZETIA) 10 MG tablet, Take 1 tablet (10 mg total) by mouth daily., Disp: 90 tablet, Rfl: 3   Lancets MISC, Use as directed once a day. (DX: R73.9), Disp: 100 each, Rfl: 2   magnesium oxide (MAG-OX) 400 MG tablet, Take 400 mg by mouth every evening., Disp: , Rfl:    metFORMIN (GLUCOPHAGE) 500 MG tablet, TAKE 2 TABLETS TWICE DAILY WITH MEALS. (Patient taking differently: Take 500 mg by mouth 2 (two) times daily with a meal.), Disp: 360 tablet, Rfl: 3   olmesartan-hydrochlorothiazide (BENICAR HCT) 40-25 MG tablet, Take 1 tablet by mouth every morning., Disp: 90 tablet, Rfl: 3   pregabalin (LYRICA) 75 MG capsule, Take 75 mg by mouth 2 (two) times daily., Disp: , Rfl:    BISACODYL 5 MG EC tablet, Take 1 tablet by mouth as directed., Disp: , Rfl:   Laboratory examination:   Last metabolic panel Lab Results  Component Value Date   GLUCOSE 131 (H) 11/20/2021   NA 142 11/20/2021   K 4.0 11/20/2021   CL 100 11/20/2021   CO2 28 11/20/2021   BUN 18 11/20/2021   CREATININE 0.89 11/20/2021   GFRNONAA >60 11/20/2021   CALCIUM 11.0 (H) 11/20/2021   PROT 7.3 11/20/2021   ALBUMIN 4.9 11/20/2021   LABGLOB 2.7 03/21/2018   AGRATIO 1.6 03/21/2018   BILITOT 0.7 11/20/2021   ALKPHOS 147 (H) 11/20/2021   AST 15 11/20/2021   ALT 15 11/20/2021   ANIONGAP 14 11/20/2021    Lipid Panel Recent Labs    02/22/22 0810  CHOL 185   TRIG 183*  LDLCALC 107*  HDL 46   CRP, High Sensitivity 02/22/2022 0.00 - 3.00 mg/L 7.83 High     Lipoprotein (a) 02/22/2022 <75.0 nmol/L 54.5     HEMOGLOBIN A1C Lab Results  Component Value Date   HGBA1C 5.5 10/29/2021   MPG 111.15 10/29/2021   TSH No results for input(s): "TSH" in the last 8760 hours.  Radiology:    Cardiac Studies:   Coronary CT angiogram 05/11/2019: 1. Coronary calcium score of 140. This was 60 percentile for age and sex matched control. 2. Normal coronary origin with left dominance. 3. CAD-RADS 2. Mild non-obstructive CAD (25-49%) in the proximal LAD. Consider preventive therapy and risk factor modification.  Echocardiogram 12/28/2020: 1. Left ventricular ejection fraction, by estimation, is 70 to 75%. The left ventricle has hyperdynamic function. The left ventricle has no regional wall motion abnormalities. Left ventricular diastolic parameters are indeterminate.  2. Right ventricular systolic function is normal. The right ventricular size is normal. There is normal pulmonary artery systolic pressure.  3. The mitral valve is normal in structure. No evidence of mitral valve regurgitation. No evidence of mitral stenosis.  4. The aortic valve is calcified. Aortic valve regurgitation is not visualized. Mild aortic valve stenosis.  ABI 10/26/2021: +-------+-----------+-----------+------------+------------+  ABI/TBIToday's ABIToday's TBIPrevious ABIPrevious TBI  +-------+-----------+-----------+------------+------------+  Right  1.25  0.93       1.00        0.49          +-------+-----------+-----------+------------+------------+  Left   1.04       0.75       0.95        0.61          +-------+-----------+-----------+------------+------------+   Carotid artery duplex 10/26/2021: Bilateral 1-39% ICA proximal stenosis with heterogenous plaque. Antegrade flow bilateral vertebral arteries.  Normal flow hemodynamics in the subclavian  arteries.  EKG:   EKG 01/03/2022: Normal sinus rhythm at rate of 59 bpm, normal EKG.    Assessment     ICD-10-CM   1. Aortoiliac occlusive disease (HCC)  I74.09 High sensitivity CRP    Lipid Panel With LDL/HDL Ratio    Lipoprotein A (LPA)    2. PAD (peripheral artery disease) (HCC)  I73.9     3. Primary hypertension  I10 olmesartan-hydrochlorothiazide (BENICAR HCT) 40-25 MG tablet    4. Pure hypercholesterolemia  E78.00 High sensitivity CRP    Lipid Panel With LDL/HDL Ratio    Lipoprotein A (LPA)    ezetimibe (ZETIA) 10 MG tablet    Lipid Panel With LDL/HDL Ratio    Lipid Panel With LDL/HDL Ratio    5. Mild aortic stenosis  I35.0        Orders Placed This Encounter  Procedures   High sensitivity CRP   Lipid Panel With LDL/HDL Ratio   Lipoprotein A (LPA)   Lipid Panel With LDL/HDL Ratio    Standing Status:   Future    Number of Occurrences:   1    Standing Expiration Date:   02/25/2023    Meds ordered this encounter  Medications   olmesartan-hydrochlorothiazide (BENICAR HCT) 40-25 MG tablet    Sig: Take 1 tablet by mouth every morning.    Dispense:  90 tablet    Refill:  3   ezetimibe (ZETIA) 10 MG tablet    Sig: Take 1 tablet (10 mg total) by mouth daily.    Dispense:  90 tablet    Refill:  3    Medications Discontinued During This Encounter  Medication Reason   olmesartan-hydrochlorothiazide (BENICAR HCT) 40-25 MG tablet Reorder   olmesartan-hydrochlorothiazide (BENICAR HCT) 40-25 MG tablet Reorder   aspirin EC 81 MG tablet Dose change     Recommendations:   Diane Giles is a 66 y.o.  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    he was scheduled for right knee arthroplasty by Dr. Glee Arvin.  Because she was fairly active without any chest pain or dyspnea, I had felt that her cardiac risk was low risk.  Patient in the interim has been diagnosed with skin cancer underwent surgery and has not been able to walk due to surgery on her leg.  She also had an episode of acute intestinal obstruction related to adhesions hence her knee replacement has been postponed for now.  From cardiac standpoint she is doing well, she is tolerating olmesartan HCT with excellent control of blood pressure as well.  She is on high intensity statin, I do not have a lipid profile.  We will place orders for lipid profile testing along with LPA and also CRP.  She has mild aortic stenosis, she has had an echocardiogram last year, I do not think she needs any further follow-up with this.  From cardiac standpoint would like to see her back in a year or sooner if problems in view of extreme high risk that she has underlying.  Addendum: Patient's LDL is not at target, added Zetia 10 mg daily and will recheck lipids in 3 months.  Patient be a good candidate for screening in 2 "ZEUS Trial" (Ziltevekimab [a monoclonal antibody] 15 mg SQ monthly to reduce MACE in ASCVD, CKD and chronic inflammation.    Yates Decamp, MD, [facility] 02/24/2022, 9:52 AM Office: [phone]

## 2022-02-23 LAB — LIPID PANEL WITH LDL/HDL RATIO
Cholesterol, Total: 185 mg/dL (ref 100–199)
HDL: 46 mg/dL (ref >39)
LDL Chol Calc (NIH): 107 mg/dL — ABNORMAL HIGH (ref 0–99)
LDL/HDL Ratio: 2.3 ratio (ref 0.0–3.2)
Triglycerides: 183 mg/dL — ABNORMAL HIGH (ref 0–149)
VLDL Cholesterol Cal: 32 mg/dL (ref 5–40)

## 2022-02-23 LAB — LIPOPROTEIN A (LPA): Lipoprotein (a): 54.5 nmol/L (ref <75.0)

## 2022-02-23 LAB — HIGH SENSITIVITY CRP: CRP, High Sensitivity: 7.83 mg/L — ABNORMAL HIGH (ref 0.00–3.00)

## 2022-02-24 MED ORDER — EZETIMIBE 10 MG PO TABS: 10.0000 mg | ORAL_TABLET | Freq: Every day | ORAL | 3 refills | Status: DC

## 2022-02-24 NOTE — Addendum Note (Signed)
Addended by: Kela Millin on: 02/24/2022 09:53 AM   Modules accepted: Orders

## 2022-02-26 ENCOUNTER — Encounter (HOSPITAL_COMMUNITY): Admission: RE | Payer: Self-pay | Source: Home / Self Care

## 2022-02-26 ENCOUNTER — Ambulatory Visit: Admit: 2022-02-26 | Payer: Medicare Other | Admitting: Orthopaedic Surgery

## 2022-02-26 DIAGNOSIS — M1711 Unilateral primary osteoarthritis, right knee: Secondary | ICD-10-CM

## 2022-02-26 SURGERY — ARTHROPLASTY, KNEE, TOTAL
Anesthesia: Spinal | Site: Knee | Laterality: Right

## 2022-03-13 ENCOUNTER — Encounter: Payer: Medicare Other | Admitting: Orthopaedic Surgery

## 2022-03-21 ENCOUNTER — Ambulatory Visit (INDEPENDENT_AMBULATORY_CARE_PROVIDER_SITE_OTHER): Payer: Medicare Other

## 2022-03-21 ENCOUNTER — Encounter: Payer: Self-pay | Admitting: Orthopaedic Surgery

## 2022-03-21 ENCOUNTER — Ambulatory Visit (INDEPENDENT_AMBULATORY_CARE_PROVIDER_SITE_OTHER): Payer: Medicare Other | Admitting: Orthopaedic Surgery

## 2022-03-21 DIAGNOSIS — M1711 Unilateral primary osteoarthritis, right knee: Secondary | ICD-10-CM

## 2022-03-21 DIAGNOSIS — I6522 Occlusion and stenosis of left carotid artery: Secondary | ICD-10-CM

## 2022-03-21 NOTE — Progress Notes (Signed)
Office Visit Note   Patient: Diane Giles           Date of Birth: 1955-09-03           MRN: [id number] Visit Date: 03/21/2022              Requested by: Lula Olszewski, MD [address] [place],  [place] [address] PCP: Lula Olszewski, MD   Assessment & Plan: Visit Diagnoses:  1. Primary osteoarthritis of right knee     Plan: We agree that knee replacement surgery cannot take place right now with the active wound.  I do think by 2 months the wound should be fully healed.  Would like to recheck the wound in 4 weeks.  She will meet with Eunice Blase today to confirm a surgery date for January.  Follow-Up Instructions: Return in about 4 weeks (around 04/18/2022).   Orders:  Orders Placed This Encounter  Procedures   XR KNEE 3 VIEW RIGHT   No orders of the defined types were placed in this encounter.     Procedures: No procedures performed   Clinical Data: No additional findings.   Subjective: Chief Complaint  Patient presents with   Right Knee - Pain    HPI Ms. Diane Giles comes in today for wound check of her right lower leg.  Her knee replacement surgery was canceled because of newly diagnosed melanoma on her right leg.  This is undergone surgery since.  She is doing local wound care at this time.  She would like to confirm surgical date for mid January.  Review of Systems   Objective: Vital Signs: LMP 05/07/2005 (Approximate)   Physical Exam  Ortho Exam Examination of the right knee shows a valgus deformity.  Lateral joint line tenderness.  Patellofemoral crepitus with range of motion.  She does have a 3 x 3 cm circular postsurgical wound in the anterior lateral calf from the melanoma excision.  No evidence of infection. Specialty Comments:  No specialty comments available.  Imaging: XR KNEE 3 VIEW RIGHT  Result Date: 03/21/2022 Advanced tricompartmental degenerative joint disease.  Bone-on-bone joint space narrowing of lateral and patellofemoral  compartments.  Valgus deformity.    PMFS History: Patient Active Problem List   Diagnosis Date Noted   Vitamin B12 deficiency 10/31/2021   Small bowel obstruction (HCC)    Partial small bowel obstruction (HCC) 10/28/2021   Primary osteoarthritis of right knee 06/20/2021   PAD (peripheral artery disease) (HCC)    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   Aortoiliac occlusive disease (HCC) 12/03/2016   Centrilobular emphysema (HCC) 10/03/2016   Right groin pain 02/27/2016   Sacroiliitis (HCC) 02/27/2016   Postmenopausal bleeding 12/01/2015   Psoas tendinitis of right side 10/21/2015   Endometrial thickening on ultra sound 07/28/2015   S/P right THA, 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   Type 2 diabetes mellitus (HCC) 05/18/2014   DDD (degenerative disc disease) 03/18/2013   Metabolic syndrome 07/29/2012   Essential hypertension, benign 07/18/2012   Hyperlipidemia 07/18/2012   Obstructive sleep apnea 07/04/2012   Osteoporosis 07/04/2012   Morbid obesity (HCC) 07/04/2012   Past Medical History:  Diagnosis Date   Aortic stenosis    mild-moderate AS 04/22/19 echo   Aortoiliac occlusive disease (HCC) 12/03/2016   Arthritis    Diabetes mellitus without complication (HCC)    per pt she is pre- diabetic   Diverticulitis    Gout    Heart murmur    Hyperlipidemia    Hypertension    Osteoporosis  Sleep apnea    Tobacco use disorder 07/04/2012    Family History  Problem Relation Age of Onset   Migraines Mother    Thyroid disease Mother        hypothyroid   Cancer Father 38       Dec age 22 with pancreatic Ca    Past Surgical History:  Procedure Laterality Date   ABDOMINAL AORTOGRAM W/LOWER EXTREMITY N/A 12/03/2016   Procedure: Abdominal Aortogram w/Lower Extremity;  Surgeon: Chuck Hint, MD;  Location: [facility] INVASIVE CV LAB;  Service: Cardiovascular;  Laterality: N/A;   AORTA -INNOMIATE BYPASS N/A 05/18/2019   Procedure: AORTA -INNOMIATE BYPASS Using Hemashield Gold  Graft Size 8mm;  Surgeon: Alleen Borne, MD;  Location: [facility] OR;  Service: Open Heart Surgery;  Laterality: N/A;   aorto- fem bypass Bilateral    aortobiiliac bypass in 2000   DILATATION & CURETTAGE/HYSTEROSCOPY WITH MYOSURE N/A 12/27/2015   Procedure: DILATATION & CURETTAGE/HYSTEROSCOPY;  Surgeon: Patton Salles, MD;  Location: [facility];  Service: Gynecology;  Laterality: N/A;   DILATION AND CURETTAGE OF UTERUS  11/2015   ELBOW SURGERY Left ~2007   tendon repair by Dr. Ranell Patrick   EYE SURGERY Bilateral 2008   lasik   JOINT REPLACEMENT     lower aortic bypass  2000   per patient   PERIPHERAL VASCULAR INTERVENTION  12/03/2016   Stenting of anastomotic stenosis of the aortoiliac and right common iliac artery with 7 x 39 mm VBX covered stent and postdilated with 9 mm balloon.   Right innonimate bypass  05/18/2019   Aorto innominate bypass surgery with median sternotomy   STERNOTOMY  05/18/2019   Right aorto-innonimate bypass surgery 05/18/19 Dr. Laneta Simmers   TOTAL HIP ARTHROPLASTY Right 07/27/2014   Procedure: RIGHT TOTAL HIP ARTHROPLASTY ANTERIOR APPROACH;  Surgeon: Durene Romans, MD;  Location: [facility];  Service: Orthopedics;  Laterality: Right;   Social History   Occupational History   Occupation: Tree surgeon  Tobacco Use   Smoking status: Former    Packs/day: 0.50    Years: 40.00    Total pack years: 20.00    Types: Cigarettes    Quit date: 02/23/2017    Years since quitting: 5.0   Smokeless tobacco: Never  Vaping Use   Vaping Use: Never used  Substance and Sexual Activity   Alcohol use: Yes    Comment: very rare--1/month   Drug use: No   Sexual activity: Yes    Partners: Male    Birth control/protection: Post-menopausal

## 2022-04-18 ENCOUNTER — Ambulatory Visit (INDEPENDENT_AMBULATORY_CARE_PROVIDER_SITE_OTHER): Payer: Medicare Other | Admitting: Orthopaedic Surgery

## 2022-04-18 ENCOUNTER — Encounter: Payer: Self-pay | Admitting: Orthopaedic Surgery

## 2022-04-18 DIAGNOSIS — M1711 Unilateral primary osteoarthritis, right knee: Secondary | ICD-10-CM | POA: Diagnosis not present

## 2022-04-18 DIAGNOSIS — I6522 Occlusion and stenosis of left carotid artery: Secondary | ICD-10-CM | POA: Diagnosis not present

## 2022-04-18 NOTE — Progress Notes (Signed)
Office Visit Note   Patient: Diane Giles           Date of Birth: 1956/02/14           MRN: [id number] Visit Date: 04/18/2022              Requested by: Diane Olszewski, MD [address] [place],  [place] [address] PCP: Diane Olszewski, MD   Assessment & Plan: Visit Diagnoses:  1. Primary osteoarthritis of right knee     Plan: The wound has completely resolved.  The dermatologist has released her.  She is scheduled for knee replacement on January 22 which is another 6 weeks from now.  I think is fine to proceed with the knee replacement scheduled.  Follow-Up Instructions: No follow-ups on file.   Orders:  No orders of the defined types were placed in this encounter.  No orders of the defined types were placed in this encounter.     Procedures: No procedures performed   Clinical Data: No additional findings.   Subjective: Chief Complaint  Patient presents with   Right Knee - Follow-up    Right lower leg wound check    HPI Diane Giles returns today for wound check on of her right leg.  Her dermatologist has released her. Review of Systems   Objective: Vital Signs: LMP 05/07/2005 (Approximate)   Physical Exam  Ortho Exam The wound on the right leg has fully healed.  No signs of infection.  There is no drainage. Specialty Comments:  No specialty comments available.  Imaging: No results found.   PMFS History: Patient Active Problem List   Diagnosis Date Noted   Vitamin B12 deficiency 10/31/2021   Small bowel obstruction (HCC)    Partial small bowel obstruction (HCC) 10/28/2021   Primary osteoarthritis of right knee 06/20/2021   PAD (peripheral artery disease) (HCC)    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   Aortoiliac occlusive disease (HCC) 12/03/2016   Centrilobular emphysema (HCC) 10/03/2016   Right groin pain 02/27/2016   Sacroiliitis (HCC) 02/27/2016   Postmenopausal bleeding 12/01/2015   Psoas tendinitis of  right side 10/21/2015   Endometrial thickening on ultra sound 07/28/2015   S/P right THA, 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   Type 2 diabetes mellitus (HCC) 05/18/2014   DDD (degenerative disc disease) 03/18/2013   Metabolic syndrome 07/29/2012   Essential hypertension, benign 07/18/2012   Hyperlipidemia 07/18/2012   Obstructive sleep apnea 07/04/2012   Osteoporosis 07/04/2012   Morbid obesity (HCC) 07/04/2012   Past Medical History:  Diagnosis Date   Aortic stenosis    mild-moderate AS 04/22/19 echo   Aortoiliac occlusive disease (HCC) 12/03/2016   Arthritis    Diabetes mellitus without complication (HCC)    per pt she is pre- diabetic   Diverticulitis    Gout    Heart murmur    Hyperlipidemia    Hypertension    Osteoporosis    Sleep apnea    Tobacco use disorder 07/04/2012    Family History  Problem Relation Age of Onset   Migraines Mother    Thyroid disease Mother        hypothyroid   Cancer Father 57       Dec age 48 with pancreatic Ca    Past Surgical History:  Procedure Laterality Date   ABDOMINAL AORTOGRAM W/LOWER EXTREMITY N/A 12/03/2016   Procedure: Abdominal Aortogram w/Lower Extremity;  Surgeon: Chuck Hint, MD;  Location: [facility] INVASIVE CV LAB;  Service: Cardiovascular;  Laterality: N/A;  AORTA -INNOMIATE BYPASS N/A 05/18/2019   Procedure: AORTA -INNOMIATE BYPASS Using Hemashield Gold Graft Size 8mm;  Surgeon: Alleen Borne, MD;  Location: [facility];  Service: Open Heart Surgery;  Laterality: N/A;   aorto- fem bypass Bilateral    aortobiiliac bypass in 2000   DILATATION & CURETTAGE/HYSTEROSCOPY WITH MYOSURE N/A 12/27/2015   Procedure: DILATATION & CURETTAGE/HYSTEROSCOPY;  Surgeon: Patton Salles, MD;  Location: [facility];  Service: Gynecology;  Laterality: N/A;   DILATION AND CURETTAGE OF UTERUS  11/2015   ELBOW SURGERY Left ~2007   tendon repair by Dr. Ranell Patrick   EYE SURGERY Bilateral 2008   lasik   JOINT REPLACEMENT     lower aortic bypass  2000   per  patient   PERIPHERAL VASCULAR INTERVENTION  12/03/2016   Stenting of anastomotic stenosis of the aortoiliac and right common iliac artery with 7 x 39 mm VBX covered stent and postdilated with 9 mm balloon.   Right innonimate bypass  05/18/2019   Aorto innominate bypass surgery with median sternotomy   STERNOTOMY  05/18/2019   Right aorto-innonimate bypass surgery 05/18/19 Dr. Laneta Simmers   TOTAL HIP ARTHROPLASTY Right 07/27/2014   Procedure: RIGHT TOTAL HIP ARTHROPLASTY ANTERIOR APPROACH;  Surgeon: Durene Romans, MD;  Location: [facility];  Service: Orthopedics;  Laterality: Right;   Social History   Occupational History   Occupation: Tree surgeon  Tobacco Use   Smoking status: Former    Packs/day: 0.50    Years: 40.00    Total pack years: 20.00    Types: Cigarettes    Quit date: 02/23/2017    Years since quitting: 5.1   Smokeless tobacco: Never  Vaping Use   Vaping Use: Never used  Substance and Sexual Activity   Alcohol use: Yes    Comment: very rare--1/month   Drug use: No   Sexual activity: Yes    Partners: Male    Birth control/protection: Post-menopausal

## 2022-05-20 ENCOUNTER — Other Ambulatory Visit: Payer: Self-pay | Admitting: Physician Assistant

## 2022-05-20 MED ORDER — ONDANSETRON HCL 4 MG PO TABS: 4.0000 mg | ORAL_TABLET | Freq: Every day | ORAL | 1 refills | Status: DC | PRN

## 2022-05-20 MED ORDER — ASPIRIN 81 MG PO TBEC: 81.0000 mg | DELAYED_RELEASE_TABLET | Freq: Two times a day (BID) | ORAL | 0 refills | Status: DC

## 2022-05-20 MED ORDER — DOCUSATE SODIUM 100 MG PO CAPS: 100.0000 mg | ORAL_CAPSULE | Freq: Every day | ORAL | 2 refills | Status: DC | PRN

## 2022-05-20 MED ORDER — METHOCARBAMOL 750 MG PO TABS: 750.0000 mg | ORAL_TABLET | Freq: Two times a day (BID) | ORAL | 2 refills | Status: DC | PRN

## 2022-05-20 MED ORDER — CEFADROXIL 500 MG PO CAPS: 500.0000 mg | ORAL_CAPSULE | Freq: Two times a day (BID) | ORAL | 0 refills | Status: DC

## 2022-05-20 MED ORDER — OXYCODONE-ACETAMINOPHEN 5-325 MG PO TABS: 1.0000 | ORAL_TABLET | Freq: Four times a day (QID) | ORAL | 0 refills | Status: DC | PRN

## 2022-05-22 ENCOUNTER — Encounter: Payer: Self-pay | Admitting: Orthopaedic Surgery

## 2022-05-23 NOTE — Progress Notes (Signed)
Surgical Instructions    Your procedure is scheduled on Monday, 05/28/22.  Report to [facility] Main Entrance "A" at 10:00 A.M., then check in with the Admitting office.  Call this number if you have problems the morning of surgery:  [phone]   If you have any questions prior to your surgery date call [phone]: Open Monday-Friday 8am-4pm If you experience any cold or flu symptoms such as cough, fever, chills, shortness of breath, etc. between now and your scheduled surgery, please notify us at the above number     Remember:  Do not eat after midnight the night before your surgery  You may drink clear liquids until 9:00am the morning of your surgery.   Clear liquids allowed are: Water, Non-Citrus Juices (without pulp), Carbonated Beverages, Clear Tea, Black Coffee ONLY (NO MILK, CREAM OR POWDERED CREAMER of any kind), and Gatorade  Patient Instructions  The night before surgery:  No food after midnight. ONLY clear liquids after midnight   The day of surgery (if you have diabetes): Drink ONE (1) 12 oz G2 given to you in your pre admission testing appointment by 9:00am the morning of surgery. Drink in one sitting. Do not sip.  This drink was given to you during your hospital  pre-op appointment visit.  Nothing else to drink after completing the  12 oz bottle of G2.         If you have questions, please contact your surgeon's office.     Take these medicines the morning of surgery with A SIP OF WATER:  allopurinol (ZYLOPRIM)  atenolol (TENORMIN)  buPROPion (WELLBUTRIN XL)  cefadroxil (DURICEF)  ezetimibe (ZETIA)  pregabalin (LYRICA)   IF NEEDED: acetaminophen (TYLENOL)  methocarbamol (ROBAXIN-750)  ondansetron (ZOFRAN)   As of today, STOP taking any Aspirin (unless otherwise instructed by your surgeon) Aleve, Naproxen, Ibuprofen, Motrin, Advil, Goody's, BC's, all herbal medications, fish oil, and all vitamins.  WHAT DO I DO ABOUT MY DIABETES MEDICATION?   Do not  take oral diabetes medicines (pills) the morning of surgery.  Do not take Deepstep one week prior to surgery. Do not take after 05/20/22.  THE MORNING OF SURGERY, do not take metFORMIN (GLUCOPHAGE).  The day of surgery, do not take other diabetes injectables, including Byetta (exenatide), Bydureon (exenatide ER), Victoza (liraglutide), or Trulicity (dulaglutide).  If your CBG is greater than 220 mg/dL, you may take  of your sliding scale (correction) dose of insulin.   HOW TO MANAGE YOUR DIABETES BEFORE AND AFTER SURGERY  Why is it important to control my blood sugar before and after surgery? Improving blood sugar levels before and after surgery helps healing and can limit problems. A way of improving blood sugar control is eating a healthy diet by:  Eating less sugar and carbohydrates  Increasing activity/exercise  Talking with your doctor about reaching your blood sugar goals High blood sugars (greater than 180 mg/dL) can raise your risk of infections and slow your recovery, so you will need to focus on controlling your diabetes during the weeks before surgery. Make sure that the doctor who takes care of your diabetes knows about your planned surgery including the date and location.  How do I manage my blood sugar before surgery? Check your blood sugar at least 4 times a day, starting 2 days before surgery, to make sure that the level is not too high or low.  Check your blood sugar the morning of your surgery when you wake up and every 2 hours until you  get to the Short Stay unit.  If your blood sugar is less than 70 mg/dL, you will need to treat for low blood sugar: Do not take insulin. Treat a low blood sugar (less than 70 mg/dL) with  cup of clear juice (cranberry or apple), 4 glucose tablets, OR glucose gel. Recheck blood sugar in 15 minutes after treatment (to make sure it is greater than 70 mg/dL). If your blood sugar is not greater than 70 mg/dL on recheck, call [phone]  for further instructions. Report your blood sugar to the short stay nurse when you get to Short Stay.  If you are admitted to the hospital after surgery: Your blood sugar will be checked by the staff and you will probably be given insulin after surgery (instead of oral diabetes medicines) to make sure you have good blood sugar levels. The goal for blood sugar control after surgery is 80-180 mg/dL.            Do not wear jewelry or makeup. Do not wear lotions, powders, perfumes or deodorant. Do not shave 48 hours prior to surgery.   Do not bring valuables to the hospital. Do not wear nail polish, gel polish, artificial nails, or any other type of covering on natural nails (fingers and toes) If you have artificial nails or gel coating that need to be removed by a nail salon, please have this removed prior to surgery. Artificial nails or gel coating may interfere with anesthesia's ability to adequately monitor your vital signs.  [facility] is not responsible for any belongings or valuables.    Do NOT Smoke (Tobacco/Vaping)  24 hours prior to your procedure  If you use a CPAP at night, you may bring your mask for your overnight stay.   Contacts, glasses, hearing aids, dentures or partials may not be worn into surgery, please bring cases for these belongings   For patients admitted to the hospital, discharge time will be determined by your treatment team.   Patients discharged the day of surgery will not be allowed to drive home, and someone needs to stay with them for 24 hours.   SURGICAL WAITING ROOM VISITATION Patients having surgery or a procedure may have no more than 2 support people in the waiting area - these visitors may rotate.   Children under the age of 30 must have an adult with them who is not the patient. If the patient needs to stay at the hospital during part of their recovery, the visitor guidelines for inpatient rooms apply. Pre-op nurse will coordinate an appropriate  time for 1 support person to accompany patient in pre-op.  This support person may not rotate.   Please refer to [email or web address] for the visitor guidelines for Inpatients (after your surgery is over and you are in a regular room).    Special instructions:    Oral Hygiene is also important to reduce your risk of infection.  Remember - BRUSH YOUR TEETH THE MORNING OF SURGERY WITH YOUR REGULAR TOOTHPASTE   [place]- Preparing For Surgery  Before surgery, you can play an important role. Because skin is not sterile, your skin needs to be as free of germs as possible. You can reduce the number of germs on your skin by washing with CHG (chlorahexidine gluconate) Soap before surgery.  CHG is an antiseptic cleaner which kills germs and bonds with the skin to continue killing germs even after washing.     Please do not use if you have an allergy to  CHG or antibacterial soaps. If your skin becomes reddened/irritated stop using the CHG.  Do not shave (including legs and underarms) for at least 48 hours prior to first CHG shower. It is OK to shave your face.  Please follow these instructions carefully.     Shower the NIGHT BEFORE SURGERY and the MORNING OF SURGERY with CHG Soap.   If you chose to wash your hair, wash your hair first as usual with your normal shampoo. After you shampoo, rinse your hair and body thoroughly to remove the shampoo.  Then [employer] and genitals (private parts) with your normal soap and rinse thoroughly to remove soap.  After that Use CHG Soap as you would any other liquid soap. You can apply CHG directly to the skin and wash gently with a scrungie or a clean washcloth.   Apply the CHG Soap to your body ONLY FROM THE NECK DOWN.  Do not use on open wounds or open sores. Avoid contact with your eyes, ears, mouth and genitals (private parts). Wash Face and genitals (private parts)  with your normal soap.   Wash  thoroughly, paying special attention to the area where your surgery will be performed.  Thoroughly rinse your body with warm water from the neck down.  DO NOT shower/wash with your normal soap after using and rinsing off the CHG Soap.  Pat yourself dry with a CLEAN TOWEL.  Wear CLEAN PAJAMAS to bed the night before surgery  Place CLEAN SHEETS on your bed the night before your surgery  DO NOT SLEEP WITH PETS.   Day of Surgery: Take a shower with CHG soap. Wear Clean/Comfortable clothing the morning of surgery Do not apply any deodorants/lotions.   Remember to brush your teeth WITH YOUR REGULAR TOOTHPASTE.    If you received a COVID test during your pre-op visit, it is requested that you wear a mask when out in public, stay away from anyone that may not be feeling well, and notify your surgeon if you develop symptoms. If you have been in contact with anyone that has tested positive in the last 10 days, please notify your surgeon.    Please read over the following fact sheets that you were given.

## 2022-05-24 ENCOUNTER — Other Ambulatory Visit: Payer: Self-pay

## 2022-05-24 ENCOUNTER — Encounter (HOSPITAL_COMMUNITY)
Admission: RE | Admit: 2022-05-24 | Discharge: 2022-05-24 | Disposition: A | Discharge status: Home / Self Care | Payer: Medicare Other | Source: Ambulatory Visit | Attending: Orthopaedic Surgery | Admitting: Orthopaedic Surgery

## 2022-05-24 ENCOUNTER — Encounter (HOSPITAL_COMMUNITY): Payer: Self-pay

## 2022-05-24 VITALS — BP 138/54 | HR 64 | Temp 97.6°F | Resp 17 | Ht 58.5 in | Wt 174.0 lb

## 2022-05-24 DIAGNOSIS — Z87891 Personal history of nicotine dependence: Secondary | ICD-10-CM | POA: Diagnosis not present

## 2022-05-24 DIAGNOSIS — G4733 Obstructive sleep apnea (adult) (pediatric): Secondary | ICD-10-CM | POA: Diagnosis not present

## 2022-05-24 DIAGNOSIS — M1711 Unilateral primary osteoarthritis, right knee: Secondary | ICD-10-CM | POA: Diagnosis not present

## 2022-05-24 DIAGNOSIS — E119 Type 2 diabetes mellitus without complications: Secondary | ICD-10-CM | POA: Diagnosis not present

## 2022-05-24 DIAGNOSIS — I2510 Atherosclerotic heart disease of native coronary artery without angina pectoris: Secondary | ICD-10-CM | POA: Insufficient documentation

## 2022-05-24 DIAGNOSIS — Z01812 Encounter for preprocedural laboratory examination: Secondary | ICD-10-CM | POA: Insufficient documentation

## 2022-05-24 DIAGNOSIS — I10 Essential (primary) hypertension: Secondary | ICD-10-CM | POA: Insufficient documentation

## 2022-05-24 DIAGNOSIS — Z01818 Encounter for other preprocedural examination: Secondary | ICD-10-CM

## 2022-05-24 LAB — CBC
HCT: 33.4 % — ABNORMAL LOW (ref 36.0–46.0)
Hemoglobin: 11.2 g/dL — ABNORMAL LOW (ref 12.0–15.0)
MCH: 31.7 pg (ref 26.0–34.0)
MCHC: 33.5 g/dL (ref 30.0–36.0)
MCV: 94.6 fL (ref 80.0–100.0)
Platelets: 247 10*3/uL (ref 150–400)
RBC: 3.53 MIL/uL — ABNORMAL LOW (ref 3.87–5.11)
RDW: 14.9 % (ref 11.5–15.5)
WBC: 7.4 10*3/uL (ref 4.0–10.5)
nRBC: 0 % (ref 0.0–0.2)

## 2022-05-24 LAB — BASIC METABOLIC PANEL
Anion gap: 10 (ref 5–15)
BUN: 20 mg/dL (ref 8–23)
CO2: 25 mmol/L (ref 22–32)
Calcium: 9.2 mg/dL (ref 8.9–10.3)
Chloride: 103 mmol/L (ref 98–111)
Creatinine, Ser: 1.11 mg/dL — ABNORMAL HIGH (ref 0.44–1.00)
GFR, Estimated: 54 mL/min — ABNORMAL LOW (ref >60)
Glucose, Bld: 99 mg/dL (ref 70–99)
Potassium: 4.4 mmol/L (ref 3.5–5.1)
Sodium: 138 mmol/L (ref 135–145)

## 2022-05-24 LAB — GLUCOSE, CAPILLARY: Glucose-Capillary: 103 mg/dL — ABNORMAL HIGH (ref 70–99)

## 2022-05-24 LAB — HEMOGLOBIN A1C
Hgb A1c MFr Bld: 5.8 % — ABNORMAL HIGH (ref 4.8–5.6)
Mean Plasma Glucose: 119.76 mg/dL

## 2022-05-24 LAB — SURGICAL PCR SCREEN
MRSA, PCR: NEGATIVE
Staphylococcus aureus: POSITIVE — AB

## 2022-05-24 NOTE — Progress Notes (Signed)
PCP - [facility]. Patient was seeing Margretta Sidle, MD. Patient states she is now seeing Dr. Zara Council. Cardiologist - Clayton Lefort, MD  PPM/ICD - Denies  Chest x-ray - Denies EKG - 12/28/2020 Stress Test - Denies ECHO - 12/28/2020 Cardiac Cath - Denies  Sleep Study - 06/11/2018 CPAP - Settings on 10  DM: Type II Fasting Blood Sugar - Patient states she does not check blood sugar at home. Patient states she does have CBG meter.  Last dose of GLP1 agonist-  N/A  GLP1 instructions: Patient states she is not currently taking ozempic. Patient plans to start ozempic after surgery.   Blood Thinner Instructions: N/A Aspirin Instructions: N/A  ERAS Protcol - ERAS with Drink  PRE-SURGERY Ensure or G2- G2  COVID TEST-  No   Anesthesia review: Yes. Cardiac history   Patient denies shortness of breath, fever, cough and chest pain at PAT appointment   All instructions explained to the patient, with a verbal understanding of the material. Patient agrees to go over the instructions while at home for a better understanding. Patient also instructed to self quarantine after being tested for COVID-19. The opportunity to ask questions was provided.  Cardiac clearance note from Dr. Einar Gip: 02/21/2022

## 2022-05-25 MED ORDER — TRANEXAMIC ACID 1000 MG/10ML IV SOLN
2000.0000 mg | INTRAVENOUS | Status: DC
  Filled 2022-05-25: qty 20

## 2022-05-25 NOTE — Progress Notes (Signed)
Anesthesia Chart Review:  Case: [id number] Date/Time: 05/28/22 1220   Procedure: RIGHT TOTAL KNEE ARTHROPLASTY (Right: Knee)   Anesthesia type: Spinal   Pre-op diagnosis: RIGH KNEE OSTEOARTHRITIS   Location: [facility] OR ROOM 07 / [facility] OR   Surgeons: Leandrew Koyanagi, MD       DISCUSSION: Patient is a 67 year old female scheduled for the above procedure. Surgery was initially scheduled for 08/28/21, but delayed for preoperative cardiology evaluation. She was ultimately felt low risk by cardiologist Dr. Einar Gip, but surgery further delayed by skin cancer surgery and for 10/2021 hospitalization for partial SBO related to adhesions.   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    Last cardiology evaluation by Dr. Einar Gip was on 02/21/22. She has a history of a coronary CT done 04/2019 prior to innominate artery bypass that showed mild non-obstructive CAD (25-49% pLAD, 0-25% RCA). Echo done on 12/28/20 during chest pain admission showed LVEF 70-75%, no RWMA, mild AS. She denied chest pain.She had been active but now not able to walk much due to her leg pain. Dr. Einar Gip felt that her cardiac risk was low risk. He did not think a repeat echo was needed prior to surgery.   DM well controlled with A1c 5.8%. She is on metformin 1000 mg BID. She has also been on Ozempic, but stopped it due to cost. She is planning to resume after surgery.   Anesthesia team to evaluate on the day of surgery.   VS: BP (!) 138/54   Pulse 64   Temp 36.4 C   Resp 17   Ht 4' 10.5" (1.486 m)   Wt 78.9 kg   LMP 05/07/2005 (Approximate)   SpO2 99%   BMI 35.75 kg/m    PROVIDERS: Margretta Sidle,  MD was PCP with [facility] in [place]. She now says she is seeing Dr. Zara Council. Adrian Prows, MD is cardiologist  [name], MD is vascular surgeon    LABS: Labs reviewed: Acceptable for surgery. (all labs ordered are listed, but only abnormal results are displayed)  Labs Reviewed  SURGICAL PCR SCREEN - Abnormal; Notable for the following components:      Result Value   Staphylococcus aureus POSITIVE (*)    All other components within normal limits  GLUCOSE, CAPILLARY - Abnormal; Notable for the following components:   Glucose-Capillary 103 (*)    All other components within normal limits  CBC - Abnormal; Notable for the following components:   RBC 3.53 (*)    Hemoglobin 11.2 (*)    HCT 33.4 (*)    All other components within normal limits  BASIC METABOLIC PANEL - Abnormal; Notable for the following components:   Creatinine, Ser 1.11 (*)    GFR, Estimated 54 (*)    All other components within normal limits  HEMOGLOBIN A1C - Abnormal; Notable for the following components:   Hgb A1c MFr Bld 5.8 (*)    All other components within normal limits    Home Sleep Study 06/11/18: IMPRESSION: OSA  RECOMMENDATION: This home sleep test demonstrates moderate  obstructive sleep apnea with a total AHI of 15.9/hour and O2  nadir of 86%. I will prescribe a new autoPAP machine and new  supplies.     IMAGES: CT  Chest (lung cancer screening) 10/11/21: IMPRESSION: 1. Lung-RADS 1, negative. Continue annual screening with low-dose chest CT without contrast in 12 months. 2. Aortic Atherosclerosis (ICD10-I70.0) and Emphysema (ICD10-J43.9).     EKG: 01/03/22: Sinus Bradycardia at 59 bpm  WITHIN NORMAL LIMITS   CV: US Carotid 10/26/21: Summary:  - Right Carotid: Velocities in the right ICA are consistent with a 1-39% stenosis.  - Left Carotid: Velocities in the left ICA are consistent with a 1-39%  stenosis.  - Vertebrals: Bilateral vertebral arteries demonstrate antegrade flow.  -  Subclavians: Normal flow hemodynamics were seen in bilateral subclavian arteries.    Echo 12/28/20: IMPRESSIONS   1. Left ventricular ejection fraction, by estimation, is 70 to 75%. The  left ventricle has hyperdynamic function. The left ventricle has no  regional wall motion abnormalities. Left ventricular diastolic parameters  are indeterminate.   2. Right ventricular systolic function is normal. The right ventricular  size is normal. There is normal pulmonary artery systolic pressure.   3. The mitral valve is normal in structure. No evidence of mitral valve  regurgitation. No evidence of mitral stenosis.   4. The aortic valve is calcified. Aortic valve regurgitation is not  visualized. Mild aortic valve stenosis.  AV VTI:            0.541 m  AV Peak Grad:      20.6 mmHg  AV Mean Grad:      11.8 mmHg  - Comparison echo : LVEF 70-75%, mild basal septal hypertrophy, no RWMA, grade I DD, mild-moderate AS, AV mean gradient 14.7 mmHg. AV peak gradient 29.3 mmHg. AVA by VTI 1.29 cm     CT Coronary 04/17/19 (ordered by CT surgeon Gilford Raid, MD prior to innominate artery bypass): IMPRESSION: 1. Coronary calcium score of 140. This was 60 percentile for age and sex matched control. 2. Normal coronary origin with left dominance. 3. CAD-RADS 2. Mild non-obstructive CAD (25-49%) in the proximal LAD. Consider preventive therapy and risk factor modification. The LCX had only luminal irregularities. RCA is a small non-dominant artery that has mild calcified plaque with stenosis 0-25%.    Past Medical History:  Diagnosis Date   Aortic stenosis    mild-moderate AS 04/22/19 echo   Aortoiliac occlusive disease ([place]) 12/03/2016   Arthritis    Diabetes mellitus without complication ([place])    per pt she is pre- diabetic   Diverticulitis    Gout    Heart murmur    Hyperlipidemia    Hypertension    Osteoporosis    Sleep apnea    Tobacco use disorder 07/04/2012    Past Surgical History:   Procedure Laterality Date   ABDOMINAL AORTOGRAM W/LOWER EXTREMITY N/A 12/03/2016   Procedure: Abdominal Aortogram w/Lower Extremity;  Surgeon: Angelia Mould, MD;  Location: [facility];  Service: Cardiovascular;  Laterality: N/A;   AORTA -INNOMIATE BYPASS N/A 05/18/2019   Procedure: AORTA -INNOMIATE BYPASS Using Hemashield Gold Graft Size 72m;  Surgeon: BGaye Pollack MD;  Location: [facility] OR;  Service: Open Heart Surgery;  Laterality: N/A;   aorto- fem bypass Bilateral    aortobiiliac bypass in [place]/A 12/27/2015   Procedure: DILATATION & CURETTAGE/HYSTEROSCOPY;  Surgeon: BNunzio Cobbs MD;  [facility];  Service: Gynecology;  Laterality: N/A;   DILATION AND CURETTAGE OF UTERUS  11/2015   ELBOW SURGERY Left ~2007   tendon repair by Dr. NVeverly Fells  EYE SURGERY Bilateral  2008   lasik   JOINT REPLACEMENT     lower aortic bypass  2000   per patient   PERIPHERAL VASCULAR INTERVENTION  12/03/2016   Stenting of anastomotic stenosis of the aortoiliac and right common iliac artery with 7 x 39 mm VBX covered stent and postdilated with 9 mm balloon.   Right innonimate bypass  05/18/2019   Aorto innominate bypass surgery with median sternotomy   STERNOTOMY  05/18/2019   Right aorto-innonimate bypass surgery 05/18/19 Dr. Cyndia Bent   TOTAL HIP ARTHROPLASTY Right 07/27/2014   Procedure: RIGHT TOTAL HIP ARTHROPLASTY ANTERIOR APPROACH;  Surgeon: Paralee Cancel, MD;  Location: [facility];  Service: Orthopedics;  Laterality: Right;    MEDICATIONS:  aspirin EC 81 MG tablet   cefadroxil (DURICEF) 500 MG capsule   docusate sodium (COLACE) 100 MG capsule   methocarbamol (ROBAXIN-750) 750 MG tablet   ondansetron (ZOFRAN) 4 MG tablet   oxyCODONE-acetaminophen (PERCOCET) 5-325 MG tablet   acetaminophen (TYLENOL) 500 MG tablet   allopurinol (ZYLOPRIM) 100 MG tablet   atenolol (TENORMIN) 50 MG tablet   atorvastatin (LIPITOR) 80 MG  tablet   buPROPion (WELLBUTRIN XL) 150 MG 24 hr tablet   ezetimibe (ZETIA) 10 MG tablet   Lancets MISC   magnesium oxide (MAG-OX) 400 MG tablet   metFORMIN (GLUCOPHAGE) 500 MG tablet   olmesartan-hydrochlorothiazide (BENICAR HCT) 40-25 MG tablet   OZEMPIC, 0.25 OR 0.5 MG/DOSE, 2 MG/3ML SOPN   potassium chloride (KLOR-CON) 10 MEQ tablet   pregabalin (LYRICA) 75 MG capsule   No current facility-administered medications for this encounter.    Myra Gianotti, PA-C Surgical Short Stay/Anesthesiology [facility] Phone [phone] [facility] Phone [phone] 05/25/2022 9:40 AM

## 2022-05-25 NOTE — Anesthesia Preprocedure Evaluation (Addendum)
Anesthesia Evaluation  Patient identified by MRN, date of birth, ID band Patient awake    Reviewed: Allergy & Precautions, NPO status , Patient's Chart, lab work & pertinent test results, reviewed documented beta blocker date and time   History of Anesthesia Complications Negative for: history of anesthetic complications  Airway Mallampati: I  TM Distance: >3 FB Neck ROM: Full    Dental no notable dental hx. (+) Edentulous Upper, Edentulous Lower   Pulmonary sleep apnea and Continuous Positive Airway Pressure Ventilation , former smoker   Pulmonary exam normal breath sounds clear to auscultation       Cardiovascular hypertension, Pt. on medications and Pt. on home beta blockers (-) angina + CAD (mild non-obstructive) and + Peripheral Vascular Disease  Normal cardiovascular exam+ Valvular Problems/Murmurs (mild-mod AS) AS  Rhythm:Regular Rate:Normal  12/2020 Echo  1. Left ventricular ejection fraction, by estimation, is 70 to 75%. The  left ventricle has hyperdynamic function. The left ventricle has no  regional wall motion abnormalities. Left ventricular diastolic parameters  are indeterminate.   2. Right ventricular systolic function is normal. The right ventricular  size is normal. There is normal pulmonary artery systolic pressure.   3. The mitral valve is normal in structure. No evidence of mitral valve  regurgitation. No evidence of mitral stenosis.   4. The aortic valve is calcified. Aortic valve regurgitation is not  visualized. Mild aortic valve stenosis.       Neuro/Psych negative neurological ROS     GI/Hepatic Neg liver ROS,,,  Endo/Other  diabetes, Well Controlled, Type 2, Oral Hypoglycemic Agents    Renal/GU negative Renal ROS     Musculoskeletal  (+) Arthritis ,    Abdominal  (+) + obese (BMI 35.53)  Peds  Hematology negative hematology ROS (+)   Anesthesia Other Findings ALL: codeine   Reproductive/Obstetrics                             Anesthesia Physical Anesthesia Plan  ASA: 3  Anesthesia Plan: Spinal and Regional   Post-op Pain Management: Regional block* and Minimal or no pain anticipated   Induction:   PONV Risk Score and Plan: 3 and Treatment may vary due to age or medical condition, Midazolam and Ondansetron  Airway Management Planned: Nasal Cannula and Natural Airway  Additional Equipment: None  Intra-op Plan:   Post-operative Plan:   Informed Consent: I have reviewed the patients History and Physical, chart, labs and discussed the procedure including the risks, benefits and alternatives for the proposed anesthesia with the patient or authorized representative who has indicated his/her understanding and acceptance.     Dental advisory given  Plan Discussed with: CRNA and Surgeon  Anesthesia Plan Comments: (PAT note written 05/25/2022 by Myra Gianotti, PA-C. Pt on q week ozempic been off for April 2023  )        Anesthesia Quick Evaluation

## 2022-05-28 ENCOUNTER — Encounter (HOSPITAL_COMMUNITY): Payer: Self-pay | Admitting: Orthopaedic Surgery

## 2022-05-28 ENCOUNTER — Observation Stay (HOSPITAL_COMMUNITY)
Admission: RE | Admit: 2022-05-28 | Discharge: 2022-05-29 | Disposition: A | Discharge status: Home Health | Payer: Medicare Other | Attending: Orthopaedic Surgery | Admitting: Orthopaedic Surgery

## 2022-05-28 ENCOUNTER — Other Ambulatory Visit: Payer: Self-pay

## 2022-05-28 ENCOUNTER — Encounter (HOSPITAL_COMMUNITY)
Admission: RE | Disposition: A | Discharge status: Home Health | Payer: Self-pay | Source: Home / Self Care | Attending: Orthopaedic Surgery

## 2022-05-28 ENCOUNTER — Observation Stay (HOSPITAL_COMMUNITY): Payer: Medicare Other

## 2022-05-28 ENCOUNTER — Ambulatory Visit (HOSPITAL_COMMUNITY): Payer: Medicare Other | Admitting: Vascular Surgery

## 2022-05-28 ENCOUNTER — Ambulatory Visit (HOSPITAL_BASED_OUTPATIENT_CLINIC_OR_DEPARTMENT_OTHER): Payer: Medicare Other | Admitting: Certified Registered"

## 2022-05-28 DIAGNOSIS — Z7984 Long term (current) use of oral hypoglycemic drugs: Secondary | ICD-10-CM | POA: Insufficient documentation

## 2022-05-28 DIAGNOSIS — Z96641 Presence of right artificial hip joint: Secondary | ICD-10-CM | POA: Insufficient documentation

## 2022-05-28 DIAGNOSIS — Z7985 Long-term (current) use of injectable non-insulin antidiabetic drugs: Secondary | ICD-10-CM | POA: Diagnosis not present

## 2022-05-28 DIAGNOSIS — I10 Essential (primary) hypertension: Secondary | ICD-10-CM | POA: Insufficient documentation

## 2022-05-28 DIAGNOSIS — M1711 Unilateral primary osteoarthritis, right knee: Secondary | ICD-10-CM | POA: Diagnosis present

## 2022-05-28 DIAGNOSIS — Z87891 Personal history of nicotine dependence: Secondary | ICD-10-CM

## 2022-05-28 DIAGNOSIS — M21061 Valgus deformity, not elsewhere classified, right knee: Secondary | ICD-10-CM | POA: Diagnosis not present

## 2022-05-28 DIAGNOSIS — I2510 Atherosclerotic heart disease of native coronary artery without angina pectoris: Secondary | ICD-10-CM | POA: Diagnosis not present

## 2022-05-28 DIAGNOSIS — Z7982 Long term (current) use of aspirin: Secondary | ICD-10-CM | POA: Insufficient documentation

## 2022-05-28 DIAGNOSIS — E1151 Type 2 diabetes mellitus with diabetic peripheral angiopathy without gangrene: Secondary | ICD-10-CM

## 2022-05-28 DIAGNOSIS — M24561 Contracture, right knee: Secondary | ICD-10-CM | POA: Diagnosis not present

## 2022-05-28 DIAGNOSIS — E119 Type 2 diabetes mellitus without complications: Secondary | ICD-10-CM | POA: Diagnosis not present

## 2022-05-28 DIAGNOSIS — Z79899 Other long term (current) drug therapy: Secondary | ICD-10-CM | POA: Insufficient documentation

## 2022-05-28 DIAGNOSIS — Z01818 Encounter for other preprocedural examination: Secondary | ICD-10-CM

## 2022-05-28 DIAGNOSIS — Z96651 Presence of right artificial knee joint: Secondary | ICD-10-CM

## 2022-05-28 HISTORY — PX: TOTAL KNEE ARTHROPLASTY: SHX125

## 2022-05-28 LAB — GLUCOSE, CAPILLARY
Glucose-Capillary: 97 mg/dL (ref 70–99)
Glucose-Capillary: 103 mg/dL — ABNORMAL HIGH (ref 70–99)
Glucose-Capillary: 77 mg/dL (ref 70–99)
Glucose-Capillary: 98 mg/dL (ref 70–99)
Glucose-Capillary: 130 mg/dL — ABNORMAL HIGH (ref 70–99)

## 2022-05-28 SURGERY — ARTHROPLASTY, KNEE, TOTAL
Anesthesia: Regional | Site: Knee | Laterality: Right

## 2022-05-28 MED ORDER — OXYCODONE HCL ER 10 MG PO T12A
10.0000 mg | EXTENDED_RELEASE_TABLET | Freq: Two times a day (BID) | ORAL | Status: DC
  Filled 2022-05-28: qty 1

## 2022-05-28 MED ORDER — VANCOMYCIN HCL 1000 MG IV SOLR
INTRAVENOUS | Status: AC
  Filled 2022-05-28: qty 20

## 2022-05-28 MED ORDER — TRANEXAMIC ACID-NACL 1000-0.7 MG/100ML-% IV SOLN
1000.0000 mg | INTRAVENOUS | Status: AC
  Administered 2022-05-28: 1000 mg via INTRAVENOUS
  Filled 2022-05-28: qty 100

## 2022-05-28 MED ORDER — METHOCARBAMOL 1000 MG/10ML IJ SOLN: 500.0000 mg | Freq: Four times a day (QID) | INTRAVENOUS | Status: DC | PRN

## 2022-05-28 MED ORDER — KETOROLAC TROMETHAMINE 15 MG/ML IJ SOLN
7.5000 mg | Freq: Four times a day (QID) | INTRAMUSCULAR | Status: AC
  Administered 2022-05-28 – 2022-05-29 (×4): 7.5 mg via INTRAVENOUS
  Filled 2022-05-28 (×4): qty 1

## 2022-05-28 MED ORDER — HYDROMORPHONE HCL 1 MG/ML IJ SOLN
0.5000 mg | INTRAMUSCULAR | Status: DC | PRN
  Administered 2022-05-28 – 2022-05-29 (×2): 1 mg via INTRAVENOUS
  Filled 2022-05-28 (×2): qty 1

## 2022-05-28 MED ORDER — INSULIN ASPART 100 UNIT/ML IJ SOLN: 0.0000 [IU] | INTRAMUSCULAR | Status: DC | PRN

## 2022-05-28 MED ORDER — INSULIN ASPART 100 UNIT/ML IJ SOLN
0.0000 [IU] | Freq: Three times a day (TID) | INTRAMUSCULAR | Status: DC
  Administered 2022-05-29 (×2): 3 [IU] via SUBCUTANEOUS

## 2022-05-28 MED ORDER — ORAL CARE MOUTH RINSE: 15.0000 mL | Freq: Once | OROMUCOSAL | Status: AC

## 2022-05-28 MED ORDER — OXYCODONE HCL 5 MG PO TABS
ORAL_TABLET | ORAL | Status: AC
  Filled 2022-05-28: qty 2

## 2022-05-28 MED ORDER — ATENOLOL 50 MG PO TABS
50.0000 mg | ORAL_TABLET | Freq: Every morning | ORAL | Status: DC
  Filled 2022-05-28: qty 1

## 2022-05-28 MED ORDER — ASPIRIN 81 MG PO CHEW
81.0000 mg | CHEWABLE_TABLET | Freq: Two times a day (BID) | ORAL | Status: DC
  Administered 2022-05-28 – 2022-05-29 (×2): 81 mg via ORAL
  Filled 2022-05-28 (×2): qty 1

## 2022-05-28 MED ORDER — VANCOMYCIN HCL 1000 MG IV SOLR
INTRAVENOUS | Status: DC | PRN
  Administered 2022-05-28: 1000 mg via TOPICAL

## 2022-05-28 MED ORDER — FENTANYL CITRATE (PF) 100 MCG/2ML IJ SOLN
25.0000 ug | INTRAMUSCULAR | Status: DC | PRN
  Administered 2022-05-28: 25 ug via INTRAVENOUS

## 2022-05-28 MED ORDER — FERROUS SULFATE 325 (65 FE) MG PO TABS
325.0000 mg | ORAL_TABLET | Freq: Three times a day (TID) | ORAL | Status: DC
  Administered 2022-05-29 (×2): 325 mg via ORAL
  Filled 2022-05-28 (×3): qty 1

## 2022-05-28 MED ORDER — BUPIVACAINE IN DEXTROSE 0.75-8.25 % IT SOLN
INTRATHECAL | Status: DC | PRN
  Administered 2022-05-28: 11.25 mg via INTRATHECAL

## 2022-05-28 MED ORDER — ONDANSETRON HCL 4 MG/2ML IJ SOLN
INTRAMUSCULAR | Status: AC
  Filled 2022-05-28: qty 2

## 2022-05-28 MED ORDER — CEFAZOLIN SODIUM-DEXTROSE 2-4 GM/100ML-% IV SOLN
2.0000 g | Freq: Four times a day (QID) | INTRAVENOUS | Status: AC
  Administered 2022-05-28 – 2022-05-29 (×2): 2 g via INTRAVENOUS
  Filled 2022-05-28 (×2): qty 100

## 2022-05-28 MED ORDER — SODIUM CHLORIDE 0.9 % IR SOLN
Status: DC | PRN
  Administered 2022-05-28: 1000 mL

## 2022-05-28 MED ORDER — METHOCARBAMOL 500 MG PO TABS
500.0000 mg | ORAL_TABLET | Freq: Four times a day (QID) | ORAL | Status: DC | PRN
  Administered 2022-05-28 – 2022-05-29 (×2): 500 mg via ORAL
  Filled 2022-05-28: qty 1

## 2022-05-28 MED ORDER — ACETAMINOPHEN 500 MG PO TABS
1000.0000 mg | ORAL_TABLET | Freq: Four times a day (QID) | ORAL | Status: DC
  Administered 2022-05-29 (×2): 1000 mg via ORAL
  Filled 2022-05-28 (×4): qty 2

## 2022-05-28 MED ORDER — FENTANYL CITRATE (PF) 100 MCG/2ML IJ SOLN
INTRAMUSCULAR | Status: AC
  Filled 2022-05-28: qty 2

## 2022-05-28 MED ORDER — METFORMIN HCL 500 MG PO TABS
500.0000 mg | ORAL_TABLET | Freq: Two times a day (BID) | ORAL | Status: DC
  Administered 2022-05-29: 500 mg via ORAL
  Filled 2022-05-28: qty 1

## 2022-05-28 MED ORDER — BUPIVACAINE-MELOXICAM ER 400-12 MG/14ML IJ SOLN
INTRAMUSCULAR | Status: AC
  Filled 2022-05-28: qty 1

## 2022-05-28 MED ORDER — MIDAZOLAM HCL 2 MG/2ML IJ SOLN: 1.0000 mg | Freq: Once | INTRAMUSCULAR | Status: AC

## 2022-05-28 MED ORDER — METHOCARBAMOL 500 MG PO TABS
ORAL_TABLET | ORAL | Status: AC
  Filled 2022-05-28: qty 1

## 2022-05-28 MED ORDER — METOCLOPRAMIDE HCL 5 MG PO TABS: 5.0000 mg | ORAL_TABLET | Freq: Three times a day (TID) | ORAL | Status: DC | PRN

## 2022-05-28 MED ORDER — PROPOFOL 10 MG/ML IV BOLUS
INTRAVENOUS | Status: AC
  Filled 2022-05-28: qty 20

## 2022-05-28 MED ORDER — ACETAMINOPHEN 10 MG/ML IV SOLN
1000.0000 mg | Freq: Once | INTRAVENOUS | Status: DC | PRN
  Administered 2022-05-28: 1000 mg via INTRAVENOUS

## 2022-05-28 MED ORDER — MENTHOL 3 MG MT LOZG: 1.0000 | LOZENGE | OROMUCOSAL | Status: DC | PRN

## 2022-05-28 MED ORDER — CEFAZOLIN SODIUM-DEXTROSE 2-4 GM/100ML-% IV SOLN
2.0000 g | INTRAVENOUS | Status: AC
  Administered 2022-05-28: 2 g via INTRAVENOUS
  Filled 2022-05-28: qty 100

## 2022-05-28 MED ORDER — CHLORHEXIDINE GLUCONATE 0.12 % MT SOLN
15.0000 mL | Freq: Once | OROMUCOSAL | Status: AC
  Administered 2022-05-28: 15 mL via OROMUCOSAL
  Filled 2022-05-28: qty 15

## 2022-05-28 MED ORDER — POVIDONE-IODINE 10 % EX SWAB
2.0000 | Freq: Once | CUTANEOUS | Status: AC
  Administered 2022-05-28: 2 via TOPICAL

## 2022-05-28 MED ORDER — SODIUM CHLORIDE 0.9 % IV SOLN: INTRAVENOUS | Status: DC

## 2022-05-28 MED ORDER — OXYCODONE HCL 5 MG PO TABS
5.0000 mg | ORAL_TABLET | ORAL | Status: DC | PRN
  Administered 2022-05-28: 5 mg via ORAL
  Filled 2022-05-28: qty 1

## 2022-05-28 MED ORDER — CEFADROXIL 500 MG PO CAPS
500.0000 mg | ORAL_CAPSULE | Freq: Two times a day (BID) | ORAL | Status: DC
  Administered 2022-05-29: 500 mg via ORAL
  Filled 2022-05-28: qty 1

## 2022-05-28 MED ORDER — ONDANSETRON HCL 4 MG/2ML IJ SOLN
4.0000 mg | Freq: Four times a day (QID) | INTRAMUSCULAR | Status: DC | PRN
  Administered 2022-05-28: 4 mg via INTRAVENOUS
  Filled 2022-05-28 (×2): qty 2

## 2022-05-28 MED ORDER — ONDANSETRON HCL 4 MG/2ML IJ SOLN
INTRAMUSCULAR | Status: DC | PRN
  Administered 2022-05-28: 4 mg via INTRAVENOUS

## 2022-05-28 MED ORDER — ACETAMINOPHEN 325 MG PO TABS: 325.0000 mg | ORAL_TABLET | Freq: Four times a day (QID) | ORAL | Status: DC | PRN

## 2022-05-28 MED ORDER — PHENYLEPHRINE HCL-NACL 20-0.9 MG/250ML-% IV SOLN
INTRAVENOUS | Status: DC | PRN
  Administered 2022-05-28: 25 ug/min via INTRAVENOUS

## 2022-05-28 MED ORDER — PROPOFOL 10 MG/ML IV BOLUS
INTRAVENOUS | Status: DC | PRN
  Administered 2022-05-28: 20 mg via INTRAVENOUS

## 2022-05-28 MED ORDER — FENTANYL CITRATE (PF) 100 MCG/2ML IJ SOLN
INTRAMUSCULAR | Status: AC
  Administered 2022-05-28: 50 ug via INTRAVENOUS
  Filled 2022-05-28: qty 2

## 2022-05-28 MED ORDER — METOCLOPRAMIDE HCL 5 MG/ML IJ SOLN
5.0000 mg | Freq: Three times a day (TID) | INTRAMUSCULAR | Status: DC | PRN
  Administered 2022-05-28: 10 mg via INTRAVENOUS
  Filled 2022-05-28: qty 2

## 2022-05-28 MED ORDER — PHENOL 1.4 % MT LIQD: 1.0000 | OROMUCOSAL | Status: DC | PRN

## 2022-05-28 MED ORDER — LACTATED RINGERS IV SOLN: INTRAVENOUS | Status: DC

## 2022-05-28 MED ORDER — ACETAMINOPHEN 10 MG/ML IV SOLN
INTRAVENOUS | Status: AC
  Filled 2022-05-28: qty 100

## 2022-05-28 MED ORDER — ONDANSETRON HCL 4 MG/2ML IJ SOLN: 4.0000 mg | Freq: Once | INTRAMUSCULAR | Status: DC | PRN

## 2022-05-28 MED ORDER — BUPIVACAINE-MELOXICAM ER 400-12 MG/14ML IJ SOLN
INTRAMUSCULAR | Status: DC | PRN
  Administered 2022-05-28: 400 mg

## 2022-05-28 MED ORDER — FENTANYL CITRATE (PF) 100 MCG/2ML IJ SOLN: 50.0000 ug | Freq: Once | INTRAMUSCULAR | Status: AC

## 2022-05-28 MED ORDER — BUPROPION HCL ER (XL) 150 MG PO TB24
150.0000 mg | ORAL_TABLET | Freq: Every morning | ORAL | Status: DC
  Administered 2022-05-29: 150 mg via ORAL
  Filled 2022-05-28: qty 1

## 2022-05-28 MED ORDER — MIDAZOLAM HCL 2 MG/2ML IJ SOLN
INTRAMUSCULAR | Status: AC
  Administered 2022-05-28: 1 mg via INTRAVENOUS
  Filled 2022-05-28: qty 2

## 2022-05-28 MED ORDER — INSULIN ASPART 100 UNIT/ML IJ SOLN: 0.0000 [IU] | Freq: Every day | INTRAMUSCULAR | Status: DC

## 2022-05-28 MED ORDER — OXYCODONE HCL 5 MG PO TABS
10.0000 mg | ORAL_TABLET | ORAL | Status: DC | PRN
  Administered 2022-05-28: 10 mg via ORAL

## 2022-05-28 MED ORDER — PRONTOSAN WOUND IRRIGATION OPTIME
TOPICAL | Status: DC | PRN
  Administered 2022-05-28: 1

## 2022-05-28 MED ORDER — 0.9 % SODIUM CHLORIDE (POUR BTL) OPTIME
TOPICAL | Status: DC | PRN
  Administered 2022-05-28: 1000 mL

## 2022-05-28 MED ORDER — DOCUSATE SODIUM 100 MG PO CAPS
100.0000 mg | ORAL_CAPSULE | Freq: Two times a day (BID) | ORAL | Status: DC
  Administered 2022-05-29: 100 mg via ORAL
  Filled 2022-05-28 (×2): qty 1

## 2022-05-28 MED ORDER — PROPOFOL 500 MG/50ML IV EMUL
INTRAVENOUS | Status: DC | PRN
  Administered 2022-05-28: 100 ug/kg/min via INTRAVENOUS

## 2022-05-28 MED ORDER — DEXAMETHASONE SODIUM PHOSPHATE 10 MG/ML IJ SOLN
10.0000 mg | Freq: Once | INTRAMUSCULAR | Status: AC
  Administered 2022-05-29: 10 mg via INTRAVENOUS
  Filled 2022-05-28: qty 1

## 2022-05-28 MED ORDER — ONDANSETRON HCL 4 MG PO TABS: 4.0000 mg | ORAL_TABLET | Freq: Four times a day (QID) | ORAL | Status: DC | PRN

## 2022-05-28 MED ORDER — FENTANYL CITRATE (PF) 250 MCG/5ML IJ SOLN
INTRAMUSCULAR | Status: AC
  Filled 2022-05-28: qty 5

## 2022-05-28 MED ORDER — TRANEXAMIC ACID-NACL 1000-0.7 MG/100ML-% IV SOLN
1000.0000 mg | Freq: Once | INTRAVENOUS | Status: AC
  Administered 2022-05-28: 1000 mg via INTRAVENOUS
  Filled 2022-05-28: qty 100

## 2022-05-28 MED ORDER — TRANEXAMIC ACID 1000 MG/10ML IV SOLN
INTRAVENOUS | Status: DC | PRN
  Administered 2022-05-28: 2000 mg via TOPICAL

## 2022-05-28 MED ORDER — PREGABALIN 75 MG PO CAPS
75.0000 mg | ORAL_CAPSULE | Freq: Two times a day (BID) | ORAL | Status: DC
  Administered 2022-05-28 – 2022-05-29 (×2): 75 mg via ORAL
  Filled 2022-05-28 (×2): qty 1

## 2022-05-28 SURGICAL SUPPLY — 91 items
ADH SKN CLS APL DERMABOND .7 (GAUZE/BANDAGES/DRESSINGS) ×1
ALCOHOL 70% 16 OZ (MISCELLANEOUS) ×2 IMPLANT
BAG COUNTER SPONGE SURGICOUNT (BAG) IMPLANT
BAG DECANTER FOR FLEXI CONT (MISCELLANEOUS) ×2 IMPLANT
BAG SPNG CNTER NS LX DISP (BAG)
BANDAGE ESMARK 6X9 LF (GAUZE/BANDAGES/DRESSINGS) IMPLANT
BLADE SAG 18X100X1.27 (BLADE) ×2 IMPLANT
BLADE SAW SGTL 11.0X1.19X90.0M (BLADE) IMPLANT
BLADE SAW SGTL 73X25 THK (BLADE) ×2 IMPLANT
BNDG CMPR 9X6 STRL LF SNTH (GAUZE/BANDAGES/DRESSINGS)
BNDG ESMARK 6X9 LF (GAUZE/BANDAGES/DRESSINGS)
BOWL SMART MIX CTS (DISPOSABLE) ×2 IMPLANT
BSPLAT TIB 5D D CMNT STM RT (Knees) ×1 IMPLANT
CEMENT BONE REFOBACIN R1X40 US (Cement) IMPLANT
CLSR STERI-STRIP ANTIMIC 1/2X4 (GAUZE/BANDAGES/DRESSINGS) ×4 IMPLANT
COMP FEM CMT PS STD 4 RT (Joint) ×1 IMPLANT
COMPONENT FEM CMT PS STD 4 RT (Joint) IMPLANT
COOLER ICEMAN CLASSIC (MISCELLANEOUS) ×2 IMPLANT
COVER SURGICAL LIGHT HANDLE (MISCELLANEOUS) ×2 IMPLANT
CUFF TOURN SGL QUICK 34 (TOURNIQUET CUFF) ×1
CUFF TOURN SGL QUICK 42 (TOURNIQUET CUFF) IMPLANT
CUFF TRNQT CYL 34X4.125X (TOURNIQUET CUFF) ×2 IMPLANT
DERMABOND ADVANCED .7 DNX12 (GAUZE/BANDAGES/DRESSINGS) ×2 IMPLANT
DRAPE EXTREMITY T 121X128X90 (DISPOSABLE) ×2 IMPLANT
DRAPE HALF SHEET 40X57 (DRAPES) ×2 IMPLANT
DRAPE INCISE IOBAN 66X45 STRL (DRAPES) ×2 IMPLANT
DRAPE ORTHO SPLIT 77X108 STRL (DRAPES)
DRAPE POUCH INSTRU U-SHP 10X18 (DRAPES) ×2 IMPLANT
DRAPE SURG ORHT 6 SPLT 77X108 (DRAPES) IMPLANT
DRAPE U-SHAPE 47X51 STRL (DRAPES) ×4 IMPLANT
DRSG AQUACEL AG ADV 3.5X10 (GAUZE/BANDAGES/DRESSINGS) ×2 IMPLANT
DURAPREP 26ML APPLICATOR (WOUND CARE) ×6 IMPLANT
ELECT CAUTERY BLADE 6.4 (BLADE) ×2 IMPLANT
ELECT PENCIL ROCKER SW 15FT (MISCELLANEOUS) ×2 IMPLANT
ELECT REM PT RETURN 9FT ADLT (ELECTROSURGICAL) ×1
ELECTRODE REM PT RTRN 9FT ADLT (ELECTROSURGICAL) ×2 IMPLANT
GLOVE BIOGEL PI IND STRL 7.0 (GLOVE) ×4 IMPLANT
GLOVE BIOGEL PI IND STRL 7.5 (GLOVE) ×10 IMPLANT
GLOVE ECLIPSE 7.0 STRL STRAW (GLOVE) ×6 IMPLANT
GLOVE INDICATOR 7.0 STRL GRN (GLOVE) ×2 IMPLANT
GLOVE INDICATOR 7.5 STRL GRN (GLOVE) ×2 IMPLANT
GLOVE SURG SYN 7.5  E (GLOVE) ×2
GLOVE SURG SYN 7.5 E (GLOVE) ×2 IMPLANT
GLOVE SURG SYN 7.5 PF PI (GLOVE) ×4 IMPLANT
GLOVE SURG UNDER LTX SZ7.5 (GLOVE) ×4 IMPLANT
GLOVE SURG UNDER POLY LF SZ7 (GLOVE) ×4 IMPLANT
GOWN STRL REIN XL XLG (GOWN DISPOSABLE) ×2 IMPLANT
GOWN STRL REUS W/ TWL LRG LVL3 (GOWN DISPOSABLE) ×2 IMPLANT
GOWN STRL REUS W/TWL LRG LVL3 (GOWN DISPOSABLE) ×1
GOWN TOGA ZIPPER T7+ PEEL AWAY (MISCELLANEOUS) ×4 IMPLANT
HANDPIECE INTERPULSE COAX TIP (DISPOSABLE) ×1
HDLS TROCR DRIL PIN KNEE 75 (PIN) ×1
HOOD PEEL AWAY FLYTE STAYCOOL (MISCELLANEOUS) ×2 IMPLANT
INSERT TIB ASF 11 4-5/CD RT (Insert) IMPLANT
KIT BASIN OR (CUSTOM PROCEDURE TRAY) ×2 IMPLANT
KIT TURNOVER KIT B (KITS) ×2 IMPLANT
MANIFOLD NEPTUNE II (INSTRUMENTS) ×2 IMPLANT
MARKER SKIN DUAL TIP RULER LAB (MISCELLANEOUS) ×4 IMPLANT
NDL SPNL 18GX3.5 QUINCKE PK (NEEDLE) ×2 IMPLANT
NEEDLE SPNL 18GX3.5 QUINCKE PK (NEEDLE) ×1 IMPLANT
NS IRRIG 1000ML POUR BTL (IV SOLUTION) ×2 IMPLANT
PACK TOTAL JOINT (CUSTOM PROCEDURE TRAY) ×2 IMPLANT
PAD ARMBOARD 7.5X6 YLW CONV (MISCELLANEOUS) ×4 IMPLANT
PAD COLD SHLDR WRAP-ON (PAD) ×2 IMPLANT
PIN DRILL HDLS TROCAR 75 4PK (PIN) IMPLANT
SAW OSC TIP CART 19.5X105X1.3 (SAW) ×2 IMPLANT
SCREW FEMALE HEX FIX 25X2.5 (ORTHOPEDIC DISPOSABLE SUPPLIES) IMPLANT
SET HNDPC FAN SPRY TIP SCT (DISPOSABLE) ×2 IMPLANT
SOLUTION PRONTOSAN WOUND 350ML (IRRIGATION / IRRIGATOR) ×2 IMPLANT
STAPLER VISISTAT 35W (STAPLE) IMPLANT
STEM POLY PAT PLY 29M KNEE (Knees) IMPLANT
STEM TIB ST PERS 14+30 (Stem) IMPLANT
STEM TIBIA 5 DEG SZ D R KNEE (Knees) IMPLANT
SUCTION FRAZIER HANDLE 10FR (MISCELLANEOUS) ×1
SUCTION TUBE FRAZIER 10FR DISP (MISCELLANEOUS) ×2 IMPLANT
SUT ETHILON 2 0 FS 18 (SUTURE) IMPLANT
SUT MNCRL AB 3-0 PS2 27 (SUTURE) IMPLANT
SUT VIC AB 0 CT1 27 (SUTURE) ×2
SUT VIC AB 0 CT1 27XBRD ANBCTR (SUTURE) ×4 IMPLANT
SUT VIC AB 1 CTX 27 (SUTURE) ×6 IMPLANT
SUT VIC AB 2-0 CT1 27 (SUTURE) ×5
SUT VIC AB 2-0 CT1 TAPERPNT 27 (SUTURE) ×8 IMPLANT
SYR 50ML LL SCALE MARK (SYRINGE) ×4 IMPLANT
TIBIA STEM 5 DEG SZ D R KNEE (Knees) ×1 IMPLANT
TOWEL GREEN STERILE (TOWEL DISPOSABLE) ×2 IMPLANT
TOWEL GREEN STERILE FF (TOWEL DISPOSABLE) ×2 IMPLANT
TRAY CATH 16FR W/PLASTIC CATH (SET/KITS/TRAYS/PACK) IMPLANT
TRAY FOLEY W/BAG SLVR 14FR (SET/KITS/TRAYS/PACK) IMPLANT
TUBE SUCT ARGYLE STRL (TUBING) ×2 IMPLANT
UNDERPAD 30X36 HEAVY ABSORB (UNDERPADS AND DIAPERS) ×2 IMPLANT
YANKAUER SUCT BULB TIP NO VENT (SUCTIONS) ×4 IMPLANT

## 2022-05-28 NOTE — Anesthesia Procedure Notes (Signed)
Procedure Name: MAC Date/Time: 05/28/2022 10:10 AM  Performed by: Inda Coke, CRNAPre-anesthesia Checklist: Patient identified, Emergency Drugs available, Suction available, Timeout performed and Patient being monitored Patient Re-evaluated:Patient Re-evaluated prior to induction Oxygen Delivery Method: Simple face mask Induction Type: IV induction Dental Injury: Teeth and Oropharynx as per pre-operative assessment

## 2022-05-28 NOTE — Transfer of Care (Signed)
Immediate Anesthesia Transfer of Care Note  Patient: Diane Giles  Procedure(s) Performed: RIGHT TOTAL KNEE ARTHROPLASTY (Right: Knee)  Patient Location: PACU  Anesthesia Type:MAC combined with regional for post-op pain  Level of Consciousness: awake and alert   Airway & Oxygen Therapy: Patient Spontanous Breathing  Post-op Assessment: Report given to RN and Post -op Vital signs reviewed and stable  Post vital signs: Reviewed and stable  Last Vitals:  Vitals Value Taken Time  BP 108/38 05/28/22 1216  Temp    Pulse 64 05/28/22 1218  Resp 14 05/28/22 1218  SpO2 98 % 05/28/22 1218  Vitals shown include unvalidated device data.  Last Pain:  Vitals:   05/28/22 0805  TempSrc:   PainSc: 0-No pain         Complications: No notable events documented.

## 2022-05-28 NOTE — Anesthesia Procedure Notes (Signed)
Anesthesia Regional Block: Adductor canal block   Pre-Anesthetic Checklist: , timeout performed,  Correct Patient, Correct Site, Correct Laterality,  Correct Procedure, Correct Position, site marked,  Risks and benefits discussed,  Surgical consent,  Pre-op evaluation,  At surgeon's request and post-op pain management  Laterality: Lower and Right  Prep: chloraprep       Needles:  Injection technique: Single-shot  Needle Type: Echogenic Needle     Needle Length: 9cm  Needle Gauge: 22     Additional Needles:   Procedures:,,,, ultrasound used (permanent image in chart),,    Narrative:  Start time: 05/28/2022 9:06 AM End time: 05/28/2022 9:12 AM Injection made incrementally with aspirations every 5 mL.  Performed by: Personally  Anesthesiologist: Barnet Glasgow, MD  Additional Notes: Block assessed prior to surgery. Pt tolerated procedure well.

## 2022-05-28 NOTE — Anesthesia Postprocedure Evaluation (Signed)
Anesthesia Post Note  Patient: Diane Giles  Procedure(s) Performed: RIGHT TOTAL KNEE ARTHROPLASTY (Right: Knee)     Patient location during evaluation: Nursing Unit Anesthesia Type: Regional and Spinal Level of consciousness: oriented and awake and alert Pain management: pain level controlled Vital Signs Assessment: post-procedure vital signs reviewed and stable Respiratory status: spontaneous breathing and respiratory function stable Cardiovascular status: blood pressure returned to baseline and stable Postop Assessment: no headache, no backache, no apparent nausea or vomiting and patient able to bend at knees Anesthetic complications: no  No notable events documented.  Last Vitals:  Vitals:   05/28/22 1415 05/28/22 1430  BP: (!) 130/53 (!) 126/51  Pulse: (!) 56 (!) 55  Resp: 12 12  Temp:    SpO2: 98% 98%    Last Pain:  Vitals:   05/28/22 1415  TempSrc:   PainSc: 0-No pain                 Barnet Glasgow

## 2022-05-28 NOTE — Evaluation (Signed)
Physical Therapy Evaluation Patient Details Name: Diane Giles MRN: [id number] DOB: 05-29-1955 Today's Date: 05/28/2022  History of Present Illness  67 y.o. female presents to [facility] hospital on 05/28/2022 for elective R TKA. PMH includes aortic stenosis, OA, DM, gout, OSA, HTN, HLD.  Clinical Impression  Pt presents to PT with deficits in ROM, balance, gait, strength, power. Pt is able to transfer and ambulate without physical assistance at this time, utilizing support of RW. PT provides education on R TKA HEP along with the need to maintain R knee extended at all times when resting. Pt will benefit from aggressive mobilization in an effort to improve activity tolerance and knee function.       Recommendations for follow up therapy are one component of a multi-disciplinary discharge planning process, led by the attending physician.  Recommendations may be updated based on patient status, additional functional criteria and insurance authorization.  Follow Up Recommendations Follow physician's recommendations for discharge plan and follow up therapies      Assistance Recommended at Discharge PRN  Patient can return home with the following  A little help with bathing/dressing/bathroom;Assistance with cooking/housework;Assist for transportation;Help with stairs or ramp for entrance    Equipment Recommendations None recommended by PT  Recommendations for Other Services       Functional Status Assessment Patient has had a recent decline in their functional status and demonstrates the ability to make significant improvements in function in a reasonable and predictable amount of time.     Precautions / Restrictions Precautions Precautions: Fall;Knee Precaution Booklet Issued: Yes (comment) Restrictions Weight Bearing Restrictions: Yes RLE Weight Bearing: Weight bearing as tolerated      Mobility  Bed Mobility Overal bed mobility: Needs Assistance Bed Mobility: Supine to Sit, Sit to  Supine     Supine to sit: Supervision Sit to supine: Supervision        Transfers Overall transfer level: Needs assistance Equipment used: Rolling walker (2 wheels) Transfers: Sit to/from Stand Sit to Stand: Min guard                Ambulation/Gait Ambulation/Gait assistance: Min guard Gait Distance (Feet): 100 Feet Assistive device: Rolling walker (2 wheels) Gait Pattern/deviations: Step-through pattern Gait velocity: reduced Gait velocity interpretation: <1.8 ft/sec, indicate of risk for recurrent falls   General Gait Details: slowed step-through gait  Stairs            Wheelchair Mobility    Modified Rankin (Stroke Patients Only)       Balance Overall balance assessment: Needs assistance Sitting-balance support: No upper extremity supported, Feet supported Sitting balance-Leahy Scale: Good     Standing balance support: Single extremity supported, Reliant on assistive device for balance Standing balance-Leahy Scale: Poor                               Pertinent Vitals/Pain Pain Assessment Pain Assessment: 0-10 Pain Score: 8  Pain Location: R knee Pain Descriptors / Indicators: Aching Pain Intervention(s): RN gave pain meds during session (IV meds, pt reports resolution of pain)    Home Living Family/patient expects to be discharged to:: Private residence Living Arrangements: Spouse/significant other Available Help at Discharge: Family;Available 24 hours/day Type of Home: House Home Access: Stairs to enter Entrance Stairs-Rails: Can reach both Entrance Stairs-Number of Steps: 3 Alternate Level Stairs-Number of Steps: 15 Home Layout: Two level;Able to live on main level with bedroom/bathroom Home Equipment: Rolling Walker (2 wheels);BSC/3in1  Prior Function Prior Level of Function : Independent/Modified Independent             Mobility Comments: independent without device       Hand Dominance         Extremity/Trunk Assessment   Upper Extremity Assessment Upper Extremity Assessment: Overall WFL for tasks assessed    Lower Extremity Assessment Lower Extremity Assessment: RLE deficits/detail RLE Deficits / Details: knee ROM impaired as expected, knee extensors 3/5 PF/DF 4/5    Cervical / Trunk Assessment Cervical / Trunk Assessment: Normal  Communication   Communication: No difficulties  Cognition Arousal/Alertness: Awake/alert Behavior During Therapy: WFL for tasks assessed/performed Overall Cognitive Status: Within Functional Limits for tasks assessed                                          General Comments General comments (skin integrity, edema, etc.): VSS on RA, pt with intermittent nausea during session    Exercises     Assessment/Plan    PT Assessment Patient needs continued PT services  PT Problem List Decreased strength;Decreased range of motion;Decreased activity tolerance;Decreased balance;Decreased mobility;Decreased knowledge of use of DME;Pain       PT Treatment Interventions DME instruction;Gait training;Stair training;Therapeutic activities;Functional mobility training;Therapeutic exercise;Balance training;Neuromuscular re-education;Patient/family education    PT Goals (Current goals can be found in the Care Plan section)  Acute Rehab PT Goals Patient Stated Goal: to return to independence PT Goal Formulation: With patient Time For Goal Achievement: 06/02/22 Potential to Achieve Goals: Good    Frequency 7X/week     Co-evaluation               AM-PAC PT "6 Clicks" Mobility  Outcome Measure Help needed turning from your back to your side while in a flat bed without using bedrails?: A Little Help needed moving from lying on your back to sitting on the side of a flat bed without using bedrails?: A Little Help needed moving to and from a bed to a chair (including a wheelchair)?: A Little Help needed standing up from a chair  using your arms (e.g., wheelchair or bedside chair)?: A Little Help needed to walk in hospital room?: A Little Help needed climbing 3-5 steps with a railing? : A Lot 6 Click Score: 17    End of Session   Activity Tolerance: Patient tolerated treatment well Patient left: in bed;with call bell/phone within reach Nurse Communication: Mobility status PT Visit Diagnosis: Other abnormalities of gait and mobility (R26.89);Muscle weakness (generalized) (M62.81);Pain Pain - Right/Left: Right Pain - part of body: Knee    Time: 1718-1750 PT Time Calculation (min) (ACUTE ONLY): 32 min   Charges:   PT Evaluation $PT Eval Low Complexity: 1 Low          Arlyss Gandy, PT, DPT Acute Rehabilitation Office [phone]   Arlyss Gandy 05/28/2022, 5:58 PM

## 2022-05-28 NOTE — Anesthesia Procedure Notes (Signed)
Spinal  Patient location during procedure: OR Start time: 05/28/2022 10:10 AM End time: 05/28/2022 10:14 AM Reason for block: surgical anesthesia Staffing Performed: anesthesiologist  Anesthesiologist: Barnet Glasgow, MD Performed by: Barnet Glasgow, MD Authorized by: Barnet Glasgow, MD   Preanesthetic Checklist Completed: patient identified, IV checked, risks and benefits discussed, surgical consent, monitors and equipment checked, pre-op evaluation and timeout performed Spinal Block Patient position: sitting Prep: DuraPrep and site prepped and draped Patient monitoring: heart rate, cardiac monitor, continuous pulse ox and blood pressure Approach: midline Location: L3-4 Injection technique: single-shot Needle Needle type: Pencan  Needle gauge: 24 G Needle length: 10 cm Needle insertion depth: 6 cm Assessment Sensory level: T4 Events: CSF return Additional Notes  1 Attempt (s). Pt tolerated procedure well.

## 2022-05-28 NOTE — H&P (Signed)
PREOPERATIVE H&P  Chief Complaint: RIGH KNEE OSTEOARTHRITIS  HPI: Diane Giles is a 67 y.o. female who presents for surgical treatment of [place].  She denies any changes in medical history.  Past Medical History:  Diagnosis Date   Aortic stenosis    mild-moderate AS 04/22/19 echo   Aortoiliac occlusive disease ([place]) 12/03/2016   Arthritis    Diabetes mellitus without complication ([place])    per pt she is pre- diabetic   Diverticulitis    Gout    Heart murmur    Hyperlipidemia    Hypertension    Osteoporosis    Sleep apnea    Tobacco use disorder 07/04/2012   Past Surgical History:  Procedure Laterality Date   ABDOMINAL AORTOGRAM W/LOWER EXTREMITY N/A 12/03/2016   Procedure: Abdominal Aortogram w/Lower Extremity;  Surgeon: Angelia Mould, MD;  Location: [facility];  Service: Cardiovascular;  Laterality: N/A;   AORTA -INNOMIATE BYPASS N/A 05/18/2019   Procedure: AORTA -INNOMIATE BYPASS Using Hemashield Gold Graft Size 4m;  Surgeon: BGaye Pollack MD;  Location: [facility];  Service: Open Heart Surgery;  Laterality: N/A;   aorto- fem bypass Bilateral    aortobiiliac bypass in [place] 12/27/2015   Procedure: DILATATION & CURETTAGE/HYSTEROSCOPY;  Surgeon: BNunzio Cobbs MD;  [facility];  Service: Gynecology;  Laterality: N/A;   DILATION AND CURETTAGE OF UTERUS  11/2015   ELBOW SURGERY Left ~2007   tendon repair by Dr. NVeverly Fells  EYE SURGERY Bilateral 2008   lasik   JOINT REPLACEMENT     lower aortic bypass  2000   per patient   PERIPHERAL VASCULAR INTERVENTION  12/03/2016   Stenting of anastomotic stenosis of the aortoiliac and right common iliac artery with 7 x 39 mm VBX covered stent and postdilated with 9 mm balloon.   Right innonimate bypass  05/18/2019   Aorto innominate bypass surgery with median sternotomy   STERNOTOMY  05/18/2019   Right aorto-innonimate bypass  surgery 05/18/19 Dr. BCyndia Bent  TOTAL HIP ARTHROPLASTY Right 07/27/2014   Procedure: RIGHT TOTAL HIP ARTHROPLASTY ANTERIOR APPROACH;  Surgeon: MParalee Cancel MD;  Location: [facility];  Service: Orthopedics;  Laterality: Right;   Social History   Socioeconomic History   Marital status: Married    Spouse name: Not on file   Number of children: 5   Years of education: Not on file   Highest education level: Not on file  Occupational History   Occupation: CArt gallery manager Tobacco Use   Smoking status: Former    Packs/day: 0.50    Years: 40.00    Total pack years: 20.00    Types: Cigarettes    Quit date: 02/23/2017    Years since quitting: 5.2   Smokeless tobacco: Never  Vaping Use   Vaping Use: Never used  Substance and Sexual Activity   Alcohol use: Yes    Comment: very rare--1/month   Drug use: No   Sexual activity: Yes    Partners: Male    Birth control/protection: Post-menopausal  Other Topics Concern   Not on file  Social History Narrative   Married. Patient does not exercise. She has a CGaffer   Right handed   Lives with husband in two story home   Social Determinants of Health   Financial Resource Strain: Not on file  Food Insecurity: Not on file  Transportation Needs: Not on file  Physical Activity:  Not on file  Stress: Not on file  Social Connections: Not on file   Family History  Problem Relation Age of Onset   Migraines Mother    Thyroid disease Mother        hypothyroid   Cancer Father 64       Dec age 22 with pancreatic Ca   Allergies  Allergen Reactions   Codeine Itching   Prior to Admission medications   Medication Sig Start Date End Date Taking? Authorizing Provider  acetaminophen (TYLENOL) 500 MG tablet Take 500-1,000 mg by mouth every 6 (six) hours as needed (pain.).   Yes [provider]  allopurinol (ZYLOPRIM) 100 MG tablet Take 300 mg by mouth in the morning.   Yes [provider]  aspirin EC 81 MG tablet Take 1  tablet (81 mg total) by mouth 2 (two) times daily. To be taken after surgery 05/20/22 05/20/23  Aundra Dubin, PA-C  atenolol (TENORMIN) 50 MG tablet Take 50 mg by mouth in the morning. 10/13/21  Yes [provider]  atorvastatin (LIPITOR) 80 MG tablet Take 1 tablet (80 mg total) by mouth daily at 6 PM. 08/16/17  Yes Shawnee Knapp, MD  buPROPion (WELLBUTRIN XL) 150 MG 24 hr tablet Take 150 mg by mouth every morning. 04/25/22  Yes [provider]  cefadroxil (DURICEF) 500 MG capsule Take 1 capsule (500 mg total) by mouth 2 (two) times daily. 05/20/22   Aundra Dubin, PA-C  docusate sodium (COLACE) 100 MG capsule Take 1 capsule (100 mg total) by mouth daily as needed. 05/20/22 05/20/23  Aundra Dubin, PA-C  ezetimibe (ZETIA) 10 MG tablet Take 1 tablet (10 mg total) by mouth daily. 02/24/22 02/19/23 Yes Adrian Prows, MD  magnesium oxide (MAG-OX) 400 MG tablet Take 400 mg by mouth every evening. 10/13/21  Yes [provider]  metFORMIN (GLUCOPHAGE) 500 MG tablet TAKE 2 TABLETS TWICE DAILY WITH MEALS. Patient taking differently: Take 500 mg by mouth 2 (two) times daily with a meal. 08/16/17  Yes Shawnee Knapp, MD  methocarbamol (ROBAXIN-750) 750 MG tablet Take 1 tablet (750 mg total) by mouth 2 (two) times daily as needed for muscle spasms. 05/20/22   Aundra Dubin, PA-C  olmesartan-hydrochlorothiazide (BENICAR HCT) 40-25 MG tablet Take 1 tablet by mouth every morning. 02/21/22  Yes Adrian Prows, MD  ondansetron (ZOFRAN) 4 MG tablet Take 1 tablet (4 mg total) by mouth daily as needed for nausea or vomiting. 05/20/22 05/20/23  Aundra Dubin, PA-C  oxyCODONE-acetaminophen (PERCOCET) 5-325 MG tablet Take 1-2 tablets by mouth every 6 (six) hours as needed. To be taken after surgery 05/20/22   Aundra Dubin, PA-C  potassium chloride (KLOR-CON) 10 MEQ tablet Take 10 mEq by mouth 2 (two) times daily. 05/11/22  Yes [provider]  pregabalin (LYRICA) 75 MG capsule Take 75 mg by mouth  2 (two) times daily. 01/01/20  Yes [provider]  Lancets MISC Use as directed once a day. (DX: R73.9) 01/20/15   Shawnee Knapp, MD  OZEMPIC, 0.25 OR 0.5 MG/DOSE, 2 MG/3ML SOPN Inject 0.25 mg into the skin once a week. 05/16/22   [provider]     Positive ROS: All other systems have been reviewed and were otherwise negative with the exception of those mentioned in the HPI and as above.  Physical Exam: General: Alert, no acute distress Cardiovascular: No pedal edema Respiratory: No cyanosis, no use of accessory musculature GI: abdomen soft Skin: No  lesions in the area of chief complaint Neurologic: Sensation intact distally Psychiatric: Patient is competent for consent with normal mood and affect Lymphatic: no lymphedema  MUSCULOSKELETAL: exam stable  Assessment: RIGH KNEE OSTEOARTHRITIS  Plan: Plan for Procedure(s): RIGHT TOTAL KNEE ARTHROPLASTY  The risks benefits and alternatives were discussed with the patient including but not limited to the risks of nonoperative treatment, versus surgical intervention including infection, bleeding, nerve injury,  blood clots, cardiopulmonary complications, morbidity, mortality, among others, and they were willing to proceed.   Eduard Roux, MD 05/28/2022 8:52 AM

## 2022-05-28 NOTE — Op Note (Addendum)
Total Knee Arthroplasty Procedure Note  Preoperative diagnosis: Right knee osteoarthritis  Postoperative diagnosis:same  Operative findings: Osteopenia Valgus deformity Flexion contracture  Operative procedure: Right total knee arthroplasty. CPT [phone]  Surgeon: N. Eduard Roux, MD  Assist: Madalyn Rob, PA-C; necessary for the timely completion of procedure and due to complexity of procedure.  Anesthesia: Spinal, regional, local  Tourniquet time: see anesthesia record  Implants used: Zimmer persona cemented Femur: CR 4  Tibia: D with 30 mm cemented stem Patella: 29 mm Polyethylene: 11 mm, MC  Indication: Diane Giles is a 67 y.o. year old female with a history of knee pain. Having failed conservative management, the patient elected to proceed with a total knee arthroplasty.  We have reviewed the risk and benefits of the surgery and they elected to proceed after voicing understanding.  Procedure:  After informed consent was obtained and understanding of the risk were voiced including but not limited to bleeding, infection, damage to surrounding structures including nerves and vessels, blood clots, leg length inequality and the failure to achieve desired results, the operative extremity was marked with verbal confirmation of the patient in the holding area.   The patient was then brought to the operating room and transported to the operating room table in the supine position.  A tourniquet was applied to the operative extremity around the upper thigh. The operative limb was then prepped and draped in the usual sterile fashion and preoperative antibiotics were administered.  A time out was performed prior to the start of surgery confirming the correct extremity, preoperative antibiotic administration, as well as team members, implants and instruments available for the case. Correct surgical site was also confirmed with preoperative radiographs. The limb was then elevated  for exsanguination and the tourniquet was inflated. A midline incision was made and a standard medial parapatellar approach was performed.  The infrapatellar fat pad was removed.  Suprapatellar synovium was removed to reveal the anterior distal femoral cortex.  A medial peel was performed to release the capsule of the medial tibial plateau.  The patella was then everted and was prepared and sized to a 29 mm.  A cover was placed on the patella for protection from retractors.  The knee was then brought into full flexion and we then turned our attention to the femur.  The cruciates were sacrificed.  Start site was drilled in the femur and the intramedullary distal femoral cutting guide was placed, set at 5 degrees valgus, taking 12 mm of distal resection. The distal cut was made. Osteophytes were then removed.  Next, the proximal tibial cutting guide was placed with appropriate slope, varus/valgus alignment and depth of resection. The proximal tibial cut was made taking 2 mm off the low side. Gap blocks were then used to assess the extension gap and alignment, and appropriate soft tissue releases were performed.  The lateral capsule and LCL were pie crusted to achieve a symmetric rectangular space. Attention was turned back to the femur, which was sized using the sizing guide to a size 4. Appropriate rotation of the femoral component was determined using epicondylar axis, Whiteside's line, and assessing the flexion gap under ligament tension. The appropriate size 4-in-1 cutting block was placed and checked with an angel wing and cuts were made. Posterior femoral osteophytes and uncapped bone were then removed with the curved osteotome.  Trial components were placed, and stability was checked in full extension, mid-flexion, and deep flexion. Proper tibial rotation was determined and marked.  The  patella tracked well without a lateral release.  The femoral lugs were then drilled. Trial components were then removed and  tibial preparation performed.  The tibia was sized for a size D component.   The bony surfaces were irrigated with a pulse lavage and then dried. Bone cement was vacuum mixed on the back table, and the final components sized above were cemented into place.  Antibiotic irrigation was placed in the knee joint and soft tissues while the cement cured.  After cement had finished curing, excess cement was removed. The stability of the construct was re-evaluated throughout a range of motion and found to be acceptable. The trial liner was removed, the knee was copiously irrigated, and the knee was re-evaluated for any excess bone debris. The real polyethylene liner, 11 mm thick, was inserted and checked to ensure the locking mechanism had engaged appropriately. The tourniquet was deflated and hemostasis was achieved. The wound was irrigated with normal saline.  One gram of vancomycin powder was placed in the surgical bed.  Topical 0.25% bupivacaine and meloxicam was placed in the joint for postoperative pain.  Capsular closure was performed with a #1 vicryl, subcutaneous fat closed with a 0 vicryl suture, then subcutaneous tissue closed with interrupted 2.0 vicryl suture. The skin was then closed with a 2.0 nylon and dermabond. A sterile dressing was applied.  The patient was awakened in the operating room and taken to recovery in stable condition. All sponge, needle, and instrument counts were correct at the end of the case.  Tawanna Cooler was necessary for opening, closing, retracting, limb positioning and overall facilitation and completion of the surgery.  Position: supine  Complications: none.  Time Out: performed   Drains/Packing: none  Estimated blood loss: minimal  Returned to Recovery Room: in good condition.   Antibiotics: yes   Mechanical VTE (DVT) Prophylaxis: sequential compression devices, TED thigh-high  Chemical VTE (DVT) Prophylaxis: aspirin  Fluid Replacement  Crystalloid: see  anesthesia record Blood: none  FFP: none   Specimens Removed: 1 to pathology   Sponge and Instrument Count Correct? yes   PACU: portable radiograph - knee AP and Lateral   Plan/RTC: Return in 2 weeks for wound check.   Weight Bearing/Load Lower Extremity: full   Implant Name Type Inv. Item Serial No. Manufacturer Lot No. LRB No. Used Action  CEMENT BONE REFOBACIN [id number] [place] - [id number] Cement CEMENT BONE REFOBACIN [id number] [place]  ZIMMER RECON(ORTH,TRAU,BIO,SG) [id number] Right 1 Implanted  STEM TIB ST PERS 14+30 - [id number] Stem STEM TIB ST PERS 14+30  ZIMMER RECON(ORTH,TRAU,BIO,SG) [id number] Right 1 Implanted  COMP FEM CMT PS STD 4 RT - [id number] Joint COMP FEM CMT PS STD 4 RT  ZIMMER RECON(ORTH,TRAU,BIO,SG) [id number] Right 1 Implanted  TIBIA STEM 5 DEG SZ D R KNEE - [id number] Knees TIBIA STEM 5 DEG SZ D R KNEE  ZIMMER RECON(ORTH,TRAU,BIO,SG) [id number] Right 1 Implanted  STEM POLY PAT PLY 75M KNEE - [id number] Knees STEM POLY PAT PLY 75M KNEE  ZIMMER RECON(ORTH,TRAU,BIO,SG) [id number] Right 1 Implanted  INSERT TIB ASF 11 4-5/CD RT - [id number] Insert INSERT TIB ASF 11 4-5/CD RT  ZIMMER RECON(ORTH,TRAU,BIO,SG) [id number] Right 1 Implanted    N. Eduard Roux, MD [facility] 11:38 AM

## 2022-05-28 NOTE — Discharge Instructions (Signed)

## 2022-05-29 ENCOUNTER — Encounter (HOSPITAL_COMMUNITY): Payer: Self-pay | Admitting: Orthopaedic Surgery

## 2022-05-29 ENCOUNTER — Other Ambulatory Visit: Payer: Self-pay | Admitting: Physician Assistant

## 2022-05-29 DIAGNOSIS — M1711 Unilateral primary osteoarthritis, right knee: Secondary | ICD-10-CM | POA: Diagnosis not present

## 2022-05-29 LAB — CBC
HCT: 31.6 % — ABNORMAL LOW (ref 36.0–46.0)
Hemoglobin: 10.8 g/dL — ABNORMAL LOW (ref 12.0–15.0)
MCH: 31.8 pg (ref 26.0–34.0)
MCHC: 34.2 g/dL (ref 30.0–36.0)
MCV: 92.9 fL (ref 80.0–100.0)
Platelets: 251 10*3/uL (ref 150–400)
RBC: 3.4 MIL/uL — ABNORMAL LOW (ref 3.87–5.11)
RDW: 14.5 % (ref 11.5–15.5)
WBC: 10.3 10*3/uL (ref 4.0–10.5)
nRBC: 0 % (ref 0.0–0.2)

## 2022-05-29 LAB — GLUCOSE, CAPILLARY
Glucose-Capillary: 125 mg/dL — ABNORMAL HIGH (ref 70–99)
Glucose-Capillary: 132 mg/dL — ABNORMAL HIGH (ref 70–99)

## 2022-05-29 MED ORDER — HYDROCODONE-ACETAMINOPHEN 7.5-325 MG PO TABS
1.0000 | ORAL_TABLET | Freq: Four times a day (QID) | ORAL | Status: DC | PRN
  Administered 2022-05-29: 1 via ORAL
  Filled 2022-05-29: qty 1

## 2022-05-29 MED ORDER — HYDROCODONE-ACETAMINOPHEN 7.5-325 MG PO TABS: 1.0000 | ORAL_TABLET | Freq: Four times a day (QID) | ORAL | 0 refills | Status: DC | PRN

## 2022-05-29 NOTE — Progress Notes (Signed)
Subjective: 1 Day Post-Op Procedure(s) (LRB): RIGHT TOTAL KNEE ARTHROPLASTY (Right) Patient reports pain as mild.  N/v from oxy.    Objective: Vital signs in last 24 hours: Temp:  [97.8 F (36.6 C)-97.9 F (36.6 C)] 97.9 F (36.6 C) (01/22 2115) Pulse Rate:  [54-66] 65 (01/23 0700) Resp:  [10-18] 16 (01/22 2115) BP: (98-141)/(37-59) 129/48 (01/23 0700) SpO2:  [94 %-100 %] 97 % (01/23 0700)  Intake/Output from previous day: 01/22 0701 - 01/23 0700 In: 1100 [I.V.:1000; IV Piggyback:100] Out: 775 [Urine:725; Blood:50] Intake/Output this shift: No intake/output data recorded.  Recent Labs    05/29/22 0618  HGB 10.8*   Recent Labs    05/29/22 0618  WBC 10.3  RBC 3.40*  HCT 31.6*  PLT 251   No results for input(s): "NA", "K", "CL", "CO2", "BUN", "CREATININE", "GLUCOSE", "CALCIUM" in the last 72 hours. No results for input(s): "LABPT", "INR" in the last 72 hours.  Neurologically intact Neurovascular intact Sensation intact distally Intact pulses distally Dorsiflexion/Plantar flexion intact Incision: dressing C/D/I No cellulitis present Compartment soft   Assessment/Plan: 1 Day Post-Op Procedure(s) (LRB): RIGHT TOTAL KNEE ARTHROPLASTY (Right) Advance diet Up with therapy D/C IV fluids Discharge home with home health after second PT session as long as cleared by PT ABLA- mild and stable WBAT RLE D/c oxy and added norco inpatient and po home meds       Diane Giles 05/29/2022, 7:54 AM

## 2022-05-29 NOTE — Progress Notes (Signed)
Physical Therapy Treatment Patient Details Name: Diane Giles MRN: [id number] DOB: 07-13-1955 Today's Date: 05/29/2022   History of Present Illness 67 y.o. female presents to [facility] hospital on 05/28/2022 for elective R TKA. PMH includes aortic stenosis, OA, DM, gout, OSA, HTN, HLD.    PT Comments    Patient progressing well towards PT goals. Session focused on progressive ambulation, transfers and there ex. Pt reports pain is controlled this session except she vomits every time she takes pain meds. Tolerated standing and gait training with supervision for safety. Noted to have ~10-78 degrees knee AROM. Stressed importance of having yellow foam under ankle to promote knee extension. Will plan for stair training in PM session prior to d/c home. Will follow.   Recommendations for follow up therapy are one component of a multi-disciplinary discharge planning process, led by the attending physician.  Recommendations may be updated based on patient status, additional functional criteria and insurance authorization.  Follow Up Recommendations  Follow physician's recommendations for discharge plan and follow up therapies     Assistance Recommended at Discharge PRN  Patient can return home with the following A little help with bathing/dressing/bathroom;Assistance with cooking/housework;Assist for transportation;Help with stairs or ramp for entrance   Equipment Recommendations  None recommended by PT    Recommendations for Other Services       Precautions / Restrictions Precautions Precautions: Knee;Fall Precaution Booklet Issued: No Restrictions Weight Bearing Restrictions: Yes RLE Weight Bearing: Weight bearing as tolerated     Mobility  Bed Mobility               General bed mobility comments: Up in chair upon PT arrival.    Transfers Overall transfer level: Needs assistance Equipment used: Rolling walker (2 wheels) Transfers: Sit to/from Stand Sit to Stand: Supervision            General transfer comment: Supervision for safety. Stood from chair x1 without difficulty.    Ambulation/Gait Ambulation/Gait assistance: Supervision Gait Distance (Feet): 150 Feet Assistive device: Rolling walker (2 wheels) Gait Pattern/deviations: Step-to pattern, Decreased stride length Gait velocity: decreased Gait velocity interpretation: <1.8 ft/sec, indicate of risk for recurrent falls   General Gait Details: Slow, steady gait with good knee AROM on right side during swing and stance phase.   Stairs             Wheelchair Mobility    Modified Rankin (Stroke Patients Only)       Balance Overall balance assessment: Needs assistance Sitting-balance support: No upper extremity supported, Feet supported Sitting balance-Leahy Scale: Good     Standing balance support: During functional activity, Single extremity supported Standing balance-Leahy Scale: Fair Standing balance comment: Able to static stand without UE support but needs UE support for walking/.                            Cognition Arousal/Alertness: Awake/alert Behavior During Therapy: WFL for tasks assessed/performed Overall Cognitive Status: Within Functional Limits for tasks assessed                                          Exercises Total Joint Exercises Heel Slides: AROM, Right, 10 reps, Seated Hip ABduction/ADduction: AROM, Right, 10 reps, Seated Long Arc Quad: AROM, Right, 10 reps, Seated Goniometric ROM: ~10-78 degrees knee AROM    General Comments General comments (skin integrity,  edema, etc.): Spouse present during session.      Pertinent Vitals/Pain Pain Assessment Pain Assessment: 0-10 Pain Score: 3  Pain Location: R knee Pain Descriptors / Indicators: Aching, Discomfort, Grimacing, Operative site guarding Pain Intervention(s): Monitored during session, Repositioned, Premedicated before session    Home Living Family/patient expects to be  discharged to:: Private residence Living Arrangements: Spouse/significant other;Children Available Help at Discharge: Family;Available 24 hours/day Type of Home: House Home Access: Stairs to enter Entrance Stairs-Rails: Can reach both Entrance Stairs-Number of Steps: 3 Alternate Level Stairs-Number of Steps: 15 Home Layout: Two level;Able to live on main level with bedroom/bathroom Home Equipment: Rolling Walker (2 wheels);BSC/3in1      Prior Function            PT Goals (current goals can now be found in the care plan section) Progress towards PT goals: Progressing toward goals    Frequency    7X/week      PT Plan Current plan remains appropriate    Co-evaluation              AM-PAC PT "6 Clicks" Mobility   Outcome Measure  Help needed turning from your back to your side while in a flat bed without using bedrails?: A Little Help needed moving from lying on your back to sitting on the side of a flat bed without using bedrails?: A Little Help needed moving to and from a bed to a chair (including a wheelchair)?: A Little Help needed standing up from a chair using your arms (e.g., wheelchair or bedside chair)?: A Little Help needed to walk in hospital room?: A Little Help needed climbing 3-5 steps with a railing? : A Little 6 Click Score: 18    End of Session Equipment Utilized During Treatment: Gait belt Activity Tolerance: Patient tolerated treatment well Patient left: in chair;with call bell/phone within reach;with family/visitor present Nurse Communication: Mobility status PT Visit Diagnosis: Other abnormalities of gait and mobility (R26.89);Muscle weakness (generalized) (M62.81);Pain Pain - Right/Left: Right Pain - part of body: Knee     Time: 2956-2130 PT Time Calculation (min) (ACUTE ONLY): 32 min  Charges:  $Gait Training: 8-22 mins $Therapeutic Exercise: 8-22 mins                     Vale Haven, PT, DPT Acute Rehabilitation Services Secure chat  preferred Office [phone]      Blake Divine A Ladarrell Cornwall 05/29/2022, 10:50 AM

## 2022-05-29 NOTE — Progress Notes (Signed)
Discharge instructions were given to the pt and her husband. All questions are answered.

## 2022-05-29 NOTE — TOC Transition Note (Signed)
Transition of Care [facility]) - CM/SW Discharge Note   Patient Details  Name: Diane Giles MRN: [id number] Date of Birth: 06-14-1955  [facility] CM/SW Contact:  Sharin Mons, RN Phone Number: 05/29/2022, 8:34 AM   Clinical Narrative:    Patient will DC to: home Anticipated DC date: 05/29/2022 Family notified: yes  Transport by: car        - Status post total right knee replacement, 1/22  Per MD patient ready for DC today pending therapy clearance. RN, patient, patient's husband, and [place] aware of DC. Pt without DME needs. Has RW, CPM and BSC @ home. Husband to assist with care once d/c.  Pt without RX med concerns. Post hospital f/u noted on AVS. Husband to provide transportation to home.  RNCM will sign off for now as intervention is no longer needed. Please consult [place] again if new needs arise.   Final next level of care: [place] Barriers to Discharge: No Barriers Identified   Patient Goals and CMS Choice      Discharge Placement                         Discharge Plan and Services Additional resources added to the After Visit Summary for                            [facility] Arranged: PT HH Agency: [place] Date [facility] Agency Contacted: 05/29/22 Time [place]: [phone] Representative spoke with at [place]: [place] Determinants of Health ([place]) Interventions SDOH Screenings   Food Insecurity: No Food Insecurity (05/29/2022)  Housing: Low Risk  (05/29/2022)  Transportation Needs: No Transportation Needs (05/29/2022)  Utilities: Not At Risk (05/29/2022)  Depression (PHQ2-9): Low Risk  (02/28/2019)  Tobacco Use: Medium Risk (05/28/2022)     Readmission Risk Interventions     No data to display

## 2022-05-29 NOTE — Plan of Care (Signed)
  Problem: Fluid Volume: Goal: Ability to maintain a balanced intake and output will improve Outcome: Progressing   Problem: Nutritional: Goal: Maintenance of adequate nutrition will improve Outcome: Progressing   Problem: Education: Goal: Knowledge of the prescribed therapeutic regimen will improve Outcome: Progressing

## 2022-05-29 NOTE — Discharge Summary (Signed)
Patient ID: Diane Giles MRN: [id number] DOB/AGE: 1956/04/18 67 y.o.  Admit date: 05/28/2022 Discharge date: 05/29/2022  Admission Diagnoses:  Principal Problem:   Primary osteoarthritis of right knee Active Problems:   Status post total right knee replacement   Discharge Diagnoses:  Same  Past Medical History:  Diagnosis Date   Aortic stenosis    mild-moderate AS 04/22/19 echo   Aortoiliac occlusive disease (HCC) 12/03/2016   Arthritis    Diabetes mellitus without complication (HCC)    per pt she is pre- diabetic   Diverticulitis    Gout    Heart murmur    Hyperlipidemia    Hypertension    Osteoporosis    Sleep apnea    Tobacco use disorder 07/04/2012    Surgeries: Procedure(s): RIGHT TOTAL KNEE ARTHROPLASTY on 05/28/2022   Consultants:   Discharged Condition: Improved  Hospital Course: SAPHIA LAFORTE is an 67 y.o. female who was admitted 05/28/2022 for operative treatment ofPrimary osteoarthritis of right knee. Patient has severe unremitting pain that affects sleep, daily activities, and work/hobbies. After pre-op clearance the patient was taken to the operating room on 05/28/2022 and underwent  Procedure(s): RIGHT TOTAL KNEE ARTHROPLASTY.    Patient was given perioperative antibiotics:  Anti-infectives (From admission, onward)    Start     Dose/Rate Route Frequency Ordered Stop   05/29/22 1000  cefadroxil (DURICEF) capsule 500 mg        500 mg Oral 2 times daily 05/28/22 1703     05/28/22 1800  ceFAZolin (ANCEF) IVPB 2g/100 mL premix        2 g 200 mL/hr over 30 Minutes Intravenous Every 6 hours 05/28/22 1525 05/29/22 0040   05/28/22 1050  vancomycin (VANCOCIN) powder  Status:  Discontinued          As needed 05/28/22 1050 05/28/22 1214   05/28/22 0745  ceFAZolin (ANCEF) IVPB 2g/100 mL premix        2 g 200 mL/hr over 30 Minutes Intravenous On call to O.R. 05/28/22 1027 05/28/22 1016        Patient was given sequential compression devices, early  ambulation, and chemoprophylaxis to prevent DVT.  Patient benefited maximally from hospital stay and there were no complications.    Recent vital signs: Patient Vitals for the past 24 hrs:  BP Temp Temp src Pulse Resp SpO2  05/29/22 0700 (!) 129/48 -- -- 65 -- 97 %  05/28/22 2115 (!) 129/41 97.9 F (36.6 C) Oral (!) 59 16 98 %  05/28/22 1710 (!) 116/59 -- -- (!) 56 14 100 %  05/28/22 1645 (!) 131/57 -- -- (!) 58 12 98 %  05/28/22 1615 (!) 130/55 -- -- (!) 55 12 98 %  05/28/22 1545 (!) 141/52 -- -- 62 17 98 %  05/28/22 1530 -- -- -- (!) 57 13 100 %  05/28/22 1515 (!) 127/58 97.8 F (36.6 C) -- (!) 57 14 100 %  05/28/22 1430 (!) 126/51 -- -- (!) 55 12 98 %  05/28/22 1415 (!) 130/53 -- -- (!) 56 12 98 %  05/28/22 1400 (!) 118/49 -- -- (!) 55 12 98 %  05/28/22 1345 (!) 114/48 -- -- (!) 56 12 97 %  05/28/22 1330 (!) 98/51 -- -- (!) 59 11 97 %  05/28/22 1315 (!) 103/37 -- -- (!) 54 10 96 %  05/28/22 1300 (!) 112/45 -- -- (!) 56 12 97 %  05/28/22 1245 (!) 121/45 -- -- (!) 58 18 96 %  05/28/22 1230 (!) 106/49 -- -- (!) 59 13 96 %  05/28/22 1215 (!) 108/38 97.8 F (36.6 C) -- 66 13 94 %  05/28/22 0920 (!) 110/40 -- -- (!) 59 10 95 %  05/28/22 0915 (!) 120/44 -- -- 66 16 94 %  05/28/22 0910 (!) 131/42 -- -- 64 14 98 %  05/28/22 0905 -- -- -- (!) 58 -- 100 %     Recent laboratory studies:  Recent Labs    05/29/22 0618  WBC 10.3  HGB 10.8*  HCT 31.6*  PLT 251     Discharge Medications:   Allergies as of 05/29/2022       Reactions   Codeine Itching        Medication List     STOP taking these medications    acetaminophen 500 MG tablet Commonly known as: TYLENOL   oxyCODONE-acetaminophen 5-325 MG tablet Commonly known as: Percocet       TAKE these medications    allopurinol 100 MG tablet Commonly known as: ZYLOPRIM Take 300 mg by mouth in the morning.   aspirin EC 81 MG tablet Take 1 tablet (81 mg total) by mouth 2 (two) times daily. To be taken after  surgery   atenolol 50 MG tablet Commonly known as: TENORMIN Take 50 mg by mouth in the morning.   atorvastatin 80 MG tablet Commonly known as: LIPITOR Take 1 tablet (80 mg total) by mouth daily at 6 PM.   buPROPion 150 MG 24 hr tablet Commonly known as: WELLBUTRIN XL Take 150 mg by mouth every morning.   cefadroxil 500 MG capsule Commonly known as: DURICEF Take 1 capsule (500 mg total) by mouth 2 (two) times daily.   docusate sodium 100 MG capsule Commonly known as: Colace Take 1 capsule (100 mg total) by mouth daily as needed.   ezetimibe 10 MG tablet Commonly known as: ZETIA Take 1 tablet (10 mg total) by mouth daily.   HYDROcodone-acetaminophen 7.5-325 MG tablet Commonly known as: Norco Take 1-2 tablets by mouth every 6 (six) hours as needed for moderate pain.   Lancets Misc Use as directed once a day. (DX: R73.9)   magnesium oxide 400 MG tablet Commonly known as: MAG-OX Take 400 mg by mouth every evening.   metFORMIN 500 MG tablet Commonly known as: GLUCOPHAGE TAKE 2 TABLETS TWICE DAILY WITH MEALS. What changed:  how much to take how to take this when to take this additional instructions   methocarbamol 750 MG tablet Commonly known as: Robaxin-750 Take 1 tablet (750 mg total) by mouth 2 (two) times daily as needed for muscle spasms.   olmesartan-hydrochlorothiazide 40-25 MG tablet Commonly known as: Benicar HCT Take 1 tablet by mouth every morning.   ondansetron 4 MG tablet Commonly known as: Zofran Take 1 tablet (4 mg total) by mouth daily as needed for nausea or vomiting.   Ozempic (0.25 or 0.5 MG/DOSE) 2 MG/3ML Sopn Generic drug: Semaglutide(0.25 or 0.5MG /DOS) Inject 0.25 mg into the skin once a week.   potassium chloride 10 MEQ tablet Commonly known as: KLOR-CON Take 10 mEq by mouth 2 (two) times daily.   pregabalin 75 MG capsule Commonly known as: LYRICA Take 75 mg by mouth 2 (two) times daily.               Durable Medical  Equipment  (From admission, onward)           Start     Ordered   05/28/22 1704  DME Dan Humphreys  rolling  Once       Question Answer Comment  Walker: With 5 Inch Wheels   Patient needs a walker to treat with the following condition Status post left partial knee replacement      05/28/22 1703   05/28/22 1704  DME 3 n 1  Once        05/28/22 1703   05/28/22 1704  DME Bedside commode  Once       Question:  Patient needs a bedside commode to treat with the following condition  Answer:  Status post left partial knee replacement   05/28/22 1703            Diagnostic Studies: DG Knee Right Port  Result Date: 05/28/2022 CLINICAL DATA:  Postop right knee replacement EXAM: PORTABLE RIGHT KNEE - 1-2 VIEW COMPARISON:  Radiograph 03/21/2022 FINDINGS: Postsurgical changes of right total knee arthroplasty. Normal alignment. No evidence of fracture. Expected soft tissue changes. IMPRESSION: Postsurgical changes of right total knee arthroplasty. No evidence of immediate hardware complication. Electronically Signed   By: Caprice Renshaw M.D.   On: 05/28/2022 13:03    Disposition: Discharge disposition: 01-Home or Self Care          Follow-up Information     Tarry Kos, MD. Schedule an appointment as soon as possible for a visit in 2 week(s).   Specialty: Orthopedic Surgery Contact information: [address] [place] [address] [phone]                  Signed: Cristie Hem 05/29/2022, 7:56 AM

## 2022-05-29 NOTE — Evaluation (Signed)
Occupational Therapy Evaluation Patient Details Name: Diane Giles MRN: [id number] DOB: 1955/08/22 Today's Date: 05/29/2022   History of Present Illness 67 y.o. female presents to [facility] hospital on 05/28/2022 for elective R TKA. PMH includes aortic stenosis, OA, DM, gout, OSA, HTN, HLD.   Clinical Impression   Pt admitted as above presenting with deficits as listed below. Pt plans to d/c home with PRN assist from spouse.  Pt/spouse were educated re: home set-up recommendations, removing area rugs, use of 3:1 in bathroom and tub for showers until pt is able to ambulate upstairs to walk in shower. Discussed various ways of getting into tub (posterior and side step with HHA with spouse) handout was issued and reviewed. Discussed placing frequently uses items with in reach in kitchen and reaching out to HHPT/MD for any additional questions PRN. Pt was Mod I bed mobility, Min guard for initial sit to stand from EOB and supervision for ambulation into bathroom and on/off toilet (3:1 over commode), and standing to groom at sink. She is min assist for LB ADL's, pt spouse reports that he can assist with this PRN. Pt denies any further acute OT needs at this time, will sign off as pt plans to d/c home later today with spouse and family assist PRN.   Recommendations for follow up therapy are one component of a multi-disciplinary discharge planning process, led by the attending physician.  Recommendations may be updated based on patient status, additional functional criteria and insurance authorization.   Follow Up Recommendations  No OT follow up     Assistance Recommended at Discharge PRN  Patient can return home with the following A little help with walking and/or transfers;A little help with bathing/dressing/bathroom;Help with stairs or ramp for entrance;Assist for transportation;Assistance with cooking/housework    Functional Status Assessment  Patient has had a recent decline in their functional  status and demonstrates the ability to make significant improvements in function in a reasonable and predictable amount of time.  Equipment Recommendations  BSC/3in1    Recommendations for Other Services       Precautions / Restrictions Precautions Precautions: Knee;Fall Restrictions Weight Bearing Restrictions: Yes RLE Weight Bearing: Weight bearing as tolerated      Mobility Bed Mobility Overal bed mobility: Needs Assistance Bed Mobility: Supine to Sit     Supine to sit: Modified independent (Device/Increase time)     General bed mobility comments: No hands on assist for bed mobiltiy    Transfers Overall transfer level: Needs assistance Equipment used: Rolling walker (2 wheels) Transfers: Sit to/from Stand Sit to Stand: Min guard   General transfer comment: Pt noted with nausea and vomiting just prior but was agreeable to getting up and going into bathroom.      Balance Overall balance assessment: Needs assistance Sitting-balance support: No upper extremity supported, Feet supported Sitting balance-Leahy Scale: Good   Standing balance support: Single extremity supported, No upper extremity supported, Bilateral upper extremity supported Standing balance-Leahy Scale: Fair Standing balance comment: Pt standing static at sink to wash hands and face with Min guard/close supervision withiout LOB noted     ADL either performed or assessed with clinical judgement   ADL Overall ADL's : Needs assistance/impaired Eating/Feeding: Modified independent;Sitting   Grooming: Wash/dry hands;Wash/dry face;Min guard;Standing Grooming Details (indicate cue type and reason): Standing at sink in bathroom Upper Body Bathing: Set up;Sitting   Lower Body Bathing: Minimal assistance;Sit to/from stand;Sitting/lateral leans   Upper Body Dressing : Set up;Sitting   Lower Body Dressing: Minimal  assistance;Sit to/from stand;Sitting/lateral leans   Toilet Transfer:  Supervision/safety;Min guard;Rolling walker (2 wheels);Cueing for sequencing Toilet Transfer Details (indicate cue type and reason): VC's for use of RW and 3:1 over toilet in bathroom. Toileting- Clothing Manipulation and Hygiene: Sitting/lateral lean;Min guard;Supervision/safety   Tub/ Shower Transfer: Tub transfer;Minimal assistance;BSC/3in1;Ambulation;Rolling walker (2 wheels) Tub/Shower Transfer Details (indicate cue type and reason): Pt and spouse educated in tub transfers using RW and 3:1 at home - handout was issued and reviewed Functional mobility during ADLs: Supervision/safety;Min guard;Rolling walker (2 wheels);Cueing for sequencing General ADL Comments: Pt was seen for OT assessment and pt/spouse education re: home set-up recommendations, removing area rugs, use of 3:1 in bathroom and tub for showers until pt is able to ambulate upstairs to walk in shower. Discussed various ways of getting into tub (posterior and side step with HHA with spouse) handout was issued and reviewed. Discussed placing frequently uses items with in reach in kitchen and reaching out to HHPT or MD for any additional questions PRN. Pt was Mod I bed mobility, Min guard for initial sit to stand from EOB and supervision for ambulation into bathroom and on/off toilet (3:1 over commode), and standing to groom at sink. Pt denies any further acute OT needs at this time, will sign off as pt plans to d/c home later today with spouse and family assist PRN.     Vision Patient Visual Report: No change from baseline Vision Assessment?: No apparent visual deficits            Pertinent Vitals/Pain Pain Assessment Pain Assessment: Faces Faces Pain Scale: Hurts little more Pain Location: R knee Pain Descriptors / Indicators: Aching, Discomfort, Grimacing Pain Intervention(s): Monitored during session, Limited activity within patient's tolerance, Repositioned (Sitting up in chair at end of session)        Extremity/Trunk  Assessment Upper Extremity Assessment Upper Extremity Assessment: Overall WFL for tasks assessed   Lower Extremity Assessment Lower Extremity Assessment: Defer to PT evaluation   Cervical / Trunk Assessment Cervical / Trunk Assessment: Normal   Communication Communication Communication: No difficulties   Cognition Arousal/Alertness: Awake/alert Behavior During Therapy: WFL for tasks assessed/performed Overall Cognitive Status: Within Functional Limits for tasks assessed       General Comments  VSS on RA, pt with nausea and vomiting prior to bed mobility - RN is aware and gave meds            Home Living Family/patient expects to be discharged to:: Private residence Living Arrangements: Spouse/significant other;Children Available Help at Discharge: Family;Available 24 hours/day Type of Home: House Home Access: Stairs to enter [employer] of Steps: 3 Entrance Stairs-Rails: Can reach both Home Layout: Two level;Able to live on main level with bedroom/bathroom Alternate Level Stairs-Number of Steps: 15   Bathroom Shower/Tub: Tub/shower unit;Walk-in shower (walk in upstairs)   Bathroom Toilet: Standard     Home Equipment: Agricultural consultant (2 wheels);BSC/3in1      Prior Functioning/Environment Prior Level of Function : Independent/Modified Independent   Mobility Comments: Independent without device ADLs Comments: Independent        OT Problem List: Impaired balance (sitting and/or standing);Pain;Decreased activity tolerance;Decreased knowledge of use of DME or AE      OT Treatment/Interventions: Self-care/ADL training;DME and/or AE instruction;Therapeutic activities;Patient/family education    OT Goals(Current goals can be found in the care plan section) Acute Rehab OT Goals Patient Stated Goal: Go home with spouse assist OT Goal Formulation: All assessment and education complete, DC therapy Time For Goal Achievement:  05/29/22 Potential to Achieve  Goals: Good  OT Frequency: Min 2X/week       AM-PAC OT "6 Clicks" Daily Activity     Outcome Measure Help from another person eating meals?: None Help from another person taking care of personal grooming?: A Little Help from another person toileting, which includes using toliet, bedpan, or urinal?: A Little Help from another person bathing (including washing, rinsing, drying)?: A Little Help from another person to put on and taking off regular upper body clothing?: None Help from another person to put on and taking off regular lower body clothing?: A Little 6 Click Score: 20   End of Session Equipment Utilized During Treatment: Gait belt;Rolling walker (2 wheels) Nurse Communication: Mobility status  Activity Tolerance: Patient tolerated treatment well Patient left: in chair;with call bell/phone within reach;with family/visitor present  OT Visit Diagnosis: Unsteadiness on feet (R26.81);Pain Pain - Right/Left: Right Pain - part of body: Knee                Time: 0806-0829 OT Time Calculation (min): 23 min Charges:  OT General Charges $OT Visit: 1 Visit OT Evaluation $OT Eval Low Complexity: 1 Low  Abrar Koone Beth Dixon, OTR/L 05/29/2022, 10:43 AM

## 2022-05-29 NOTE — Progress Notes (Signed)
Physical Therapy Treatment Patient Details Name: Diane Giles MRN: [id number] DOB: [birth year] Today's Date: 05/29/2022   History of Present Illness 67 y.o. female presents to [facility] hospital on 05/28/2022 for elective R TKA. PMH includes aortic stenosis, OA, DM, gout, OSA, HTN, HLD.    PT Comments    Patient progressing well this afternoon, reporting decreased pain from earlier session despite not having taken any pain meds. Session focused on stair training and gait training. Tolerated stair training with supervision for safety and cues for technique. Education re: car transfer technique and positioning of knee. Pt will have support from family at home and will likely be able to negotiate stairs to get to walk in shower on second level. Will wait for clearance from HHPT. Safe to d/c from a mobility stand point. Will follow if still in the hospital.    Recommendations for follow up therapy are one component of a multi-disciplinary discharge planning process, led by the attending physician.  Recommendations may be updated based on patient status, additional functional criteria and insurance authorization.  Follow Up Recommendations  Follow physician's recommendations for discharge plan and follow up therapies     Assistance Recommended at Discharge PRN  Patient can return home with the following A little help with bathing/dressing/bathroom;Assistance with cooking/housework;Assist for transportation;Help with stairs or ramp for entrance   Equipment Recommendations  None recommended by PT    Recommendations for Other Services       Precautions / Restrictions Precautions Precautions: Knee;Fall Precaution Booklet Issued: Yes (comment) Restrictions Weight Bearing Restrictions: Yes RLE Weight Bearing: Weight bearing as tolerated     Mobility  Bed Mobility               General bed mobility comments: Up in chair upon PT arrival.    Transfers Overall transfer level: Needs  assistance Equipment used: Rolling walker (2 wheels) Transfers: Sit to/from Stand Sit to Stand: Modified independent (Device/Increase time)           General transfer comment: Stood from chair without difficulty x2.    Ambulation/Gait Ambulation/Gait assistance: Modified independent (Device/Increase time) Gait Distance (Feet): 200 Feet Assistive device: Rolling walker (2 wheels) Gait Pattern/deviations: Step-through pattern, Decreased stride length Gait velocity: decreased Gait velocity interpretation: 1.31 - 2.62 ft/sec, indicative of limited community ambulator   General Gait Details: Slow, steady gait with use of RW.   Stairs Stairs: Yes Stairs assistance: Supervision Stair Management: Step to pattern, One rail Left, Sideways, Forwards Number of Stairs: 2 (x2 bouts) General stair comments: Using BUEs on left rail on first attempt to ascend/descend, cues needed for technique/sequencing.   Wheelchair Mobility    Modified Rankin (Stroke Patients Only)       Balance Overall balance assessment: Needs assistance Sitting-balance support: Feet supported, No upper extremity supported Sitting balance-Leahy Scale: Good     Standing balance support: During functional activity Standing balance-Leahy Scale: Fair Standing balance comment: Able to static stand without UE support but needs UE support for walking/.                            Cognition Arousal/Alertness: Awake/alert Behavior During Therapy: WFL for tasks assessed/performed Overall Cognitive Status: Within Functional Limits for tasks assessed  Exercises Total Joint Exercises Heel Slides: AROM, Right, 10 reps, Seated Hip ABduction/ADduction: AROM, Right, 10 reps, Seated Long Arc Quad: AROM, Right, 10 reps, Seated Goniometric ROM: ~10-78 degrees knee AROM    General Comments General comments (skin integrity, edema, etc.): Spouse present  during session.      Pertinent Vitals/Pain Pain Assessment Pain Assessment: 0-10 Pain Score: 2  Pain Location: R knee Pain Descriptors / Indicators: Discomfort, Operative site guarding Pain Intervention(s): Monitored during session, Repositioned    Home Living Family/patient expects to be discharged to:: Private residence Living Arrangements: Spouse/significant other;Children Available Help at Discharge: Family;Available 24 hours/day Type of Home: House Home Access: Stairs to enter Entrance Stairs-Rails: Can reach both Entrance Stairs-Number of Steps: 3 Alternate Level Stairs-Number of Steps: 15 Home Layout: Two level;Able to live on main level with bedroom/bathroom Home Equipment: Rolling Walker (2 wheels);BSC/3in1      Prior Function            PT Goals (current goals can now be found in the care plan section) Progress towards PT goals: Progressing toward goals    Frequency    7X/week      PT Plan Current plan remains appropriate    Co-evaluation              AM-PAC PT "6 Clicks" Mobility   Outcome Measure  Help needed turning from your back to your side while in a flat bed without using bedrails?: None Help needed moving from lying on your back to sitting on the side of a flat bed without using bedrails?: None Help needed moving to and from a bed to a chair (including a wheelchair)?: None Help needed standing up from a chair using your arms (e.g., wheelchair or bedside chair)?: None Help needed to walk in hospital room?: A Little Help needed climbing 3-5 steps with a railing? : A Little 6 Click Score: 22    End of Session Equipment Utilized During Treatment: Gait belt Activity Tolerance: Patient tolerated treatment well Patient left: in chair;with call bell/phone within reach Nurse Communication: Mobility status PT Visit Diagnosis: Other abnormalities of gait and mobility (R26.89);Muscle weakness (generalized) (M62.81);Pain Pain - Right/Left:  Right Pain - part of body: Knee     Time: 3762-8315 PT Time Calculation (min) (ACUTE ONLY): 14 min  Charges:  $Gait Training: 8-22 mins                     Vale Haven, PT, DPT Acute Rehabilitation Services Secure chat preferred Office [phone]      Blake Divine A Lanier Ensign 05/29/2022, 12:19 PM

## 2022-06-01 ENCOUNTER — Other Ambulatory Visit: Payer: Self-pay | Admitting: Physician Assistant

## 2022-06-12 ENCOUNTER — Ambulatory Visit (INDEPENDENT_AMBULATORY_CARE_PROVIDER_SITE_OTHER): Payer: Medicare Other | Admitting: Physician Assistant

## 2022-06-12 DIAGNOSIS — Z96651 Presence of right artificial knee joint: Secondary | ICD-10-CM

## 2022-06-12 MED ORDER — OXYCODONE-ACETAMINOPHEN 5-325 MG PO TABS: 1.0000 | ORAL_TABLET | Freq: Three times a day (TID) | ORAL | 0 refills | Status: DC | PRN

## 2022-06-12 NOTE — Progress Notes (Signed)
Post-Op Visit Note   Patient: Diane Giles           Date of Birth: 1955-06-20           MRN: [id number] Visit Date: 06/12/2022 PCP: Lula Olszewski, MD   Assessment & Plan:  Chief Complaint:  Chief Complaint  Patient presents with   Right Knee - Routine Post Op   Visit Diagnoses:  1. Status post total right knee replacement     Plan: Patient is a pleasant 67 year old female comes in today 2 weeks status post right total knee replacement 05/26/2022.  She has been doing well.  She has been getting home health physical therapy and is ambulating with a cane.  She has been taking Percocet for pain and is tolerating this well.  She has been taking a baby aspirin twice daily for DVT prophylaxis.  Examination of the right knee reveals a well-healed surgical incision with nylon sutures in place.  No evidence of infection or cellulitis.  Calves are soft nontender.  She is neurovascular intact distally.  Today, sutures were removed and Steri-Strips applied.  I sent in a referral for outpatient physical therapy at [facility] well.  I refilled her Percocet.  She will continue to take a baby aspirin twice daily for DVT prophylaxis for another 4 weeks.  Follow-up in 4 weeks for repeat evaluation and 2 view x-rays of the right knee.  Call with concerns or questions.  Follow-Up Instructions: Return in about 4 weeks (around 07/10/2022).   Orders:  Orders Placed This Encounter  Procedures   Ambulatory referral to Physical Therapy   Meds ordered this encounter  Medications   oxyCODONE-acetaminophen (PERCOCET) 5-325 MG tablet    Sig: Take 1-2 tablets by mouth every 8 (eight) hours as needed.    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   Aortoiliac occlusive disease (HCC) 12/03/2016   Centrilobular emphysema (HCC) 10/03/2016   Right groin pain 02/27/2016   Sacroiliitis (HCC) 02/27/2016   Postmenopausal bleeding 12/01/2015   Psoas tendinitis of right side 10/21/2015   Endometrial thickening on ultra sound 07/28/2015   S/P right THA, 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   Type 2 diabetes mellitus (HCC) 05/18/2014   DDD (degenerative disc disease) 03/18/2013   Metabolic syndrome 07/29/2012   Essential hypertension, benign 07/18/2012   Hyperlipidemia 07/18/2012   Obstructive sleep apnea 07/04/2012   Osteoporosis 07/04/2012   Morbid obesity (HCC) 07/04/2012   Past Medical History:  Diagnosis Date   Aortic stenosis    mild-moderate AS 04/22/19 echo   Aortoiliac occlusive disease (HCC) 12/03/2016   Arthritis    Diabetes mellitus without complication (HCC)    per pt she is pre- diabetic   Diverticulitis    Gout    Heart murmur    Hyperlipidemia    Hypertension    Osteoporosis    Sleep apnea    Tobacco use disorder 07/04/2012    Family History  Problem Relation Age of Onset   Migraines Mother    Thyroid disease Mother  hypothyroid   Cancer Father 70       Dec age 33 with pancreatic Ca    Past Surgical History:  Procedure Laterality Date   ABDOMINAL AORTOGRAM W/LOWER EXTREMITY N/A 12/03/2016   Procedure: Abdominal Aortogram w/Lower Extremity;  Surgeon: Chuck Hint, MD;  Location: [facility] INVASIVE CV LAB;  Service: Cardiovascular;  Laterality: N/A;   AORTA -INNOMIATE BYPASS N/A 05/18/2019   Procedure: AORTA -INNOMIATE BYPASS Using Hemashield Gold Graft Size 8mm;  Surgeon: Alleen Borne, MD;  Location: [facility] OR;  Service: Open Heart Surgery;  Laterality: N/A;   aorto- fem bypass Bilateral    aortobiiliac bypass in 2000   DILATATION & CURETTAGE/HYSTEROSCOPY WITH MYOSURE N/A  12/27/2015   Procedure: DILATATION & CURETTAGE/HYSTEROSCOPY;  Surgeon: Patton Salles, MD;  Location: [facility] ORS;  Service: Gynecology;  Laterality: N/A;   DILATION AND CURETTAGE OF UTERUS  11/2015   ELBOW SURGERY Left ~2007   tendon repair by Dr. Ranell Patrick   EYE SURGERY Bilateral 2008   lasik   JOINT REPLACEMENT     lower aortic bypass  2000   per patient   PERIPHERAL VASCULAR INTERVENTION  12/03/2016   Stenting of anastomotic stenosis of the aortoiliac and right common iliac artery with 7 x 39 mm VBX covered stent and postdilated with 9 mm balloon.   Right innonimate bypass  05/18/2019   Aorto innominate bypass surgery with median sternotomy   STERNOTOMY  05/18/2019   Right aorto-innonimate bypass surgery 05/18/19 Dr. Laneta Simmers   TOTAL HIP ARTHROPLASTY Right 07/27/2014   Procedure: RIGHT TOTAL HIP ARTHROPLASTY ANTERIOR APPROACH;  Surgeon: Durene Romans, MD;  Location: [facility] ORS;  Service: Orthopedics;  Laterality: Right;   TOTAL KNEE ARTHROPLASTY Right 05/28/2022   Procedure: RIGHT TOTAL KNEE ARTHROPLASTY;  Surgeon: Tarry Kos, MD;  Location: [facility] OR;  Service: Orthopedics;  Laterality: Right;   Social History   Occupational History   Occupation: Tree surgeon  Tobacco Use   Smoking status: Former    Packs/day: 0.50    Years: 40.00    Total pack years: 20.00    Types: Cigarettes    Quit date: 02/23/2017    Years since quitting: 5.3   Smokeless tobacco: Never  Vaping Use   Vaping Use: Never used  Substance and Sexual Activity   Alcohol use: Yes    Comment: very rare--1/month   Drug use: No   Sexual activity: Yes    Partners: Male    Birth control/protection: Post-menopausal

## 2022-06-13 ENCOUNTER — Ambulatory Visit (HOSPITAL_BASED_OUTPATIENT_CLINIC_OR_DEPARTMENT_OTHER): Payer: Medicare Other | Attending: Orthopaedic Surgery | Admitting: Physical Therapy

## 2022-06-13 ENCOUNTER — Ambulatory Visit: Payer: Medicare Other | Admitting: Rehabilitative and Restorative Service Providers"

## 2022-06-13 ENCOUNTER — Other Ambulatory Visit: Payer: Self-pay

## 2022-06-13 DIAGNOSIS — M25561 Pain in right knee: Secondary | ICD-10-CM | POA: Insufficient documentation

## 2022-06-13 DIAGNOSIS — R2689 Other abnormalities of gait and mobility: Secondary | ICD-10-CM | POA: Insufficient documentation

## 2022-06-13 DIAGNOSIS — Z96651 Presence of right artificial knee joint: Secondary | ICD-10-CM | POA: Diagnosis present

## 2022-06-13 DIAGNOSIS — M25661 Stiffness of right knee, not elsewhere classified: Secondary | ICD-10-CM | POA: Diagnosis not present

## 2022-06-13 DIAGNOSIS — R600 Localized edema: Secondary | ICD-10-CM | POA: Diagnosis not present

## 2022-06-13 NOTE — Therapy (Unsigned)
OUTPATIENT PHYSICAL THERAPY LOWER EXTREMITY EVALUATION   Patient Name: Diane Giles MRN: [id number] DOB:[birth year], 67 y.o., female Today's Date: 06/14/2022  END OF SESSION:   Past Medical History:  Diagnosis Date   Aortic stenosis    mild-moderate AS 04/22/19 echo   Aortoiliac occlusive disease (HCC) 12/03/2016   Arthritis    Diabetes mellitus without complication (HCC)    per pt she is pre- diabetic   Diverticulitis    Gout    Heart murmur    Hyperlipidemia    Hypertension    Osteoporosis    Sleep apnea    Tobacco use disorder 07/04/2012   Past Surgical History:  Procedure Laterality Date   ABDOMINAL AORTOGRAM W/LOWER EXTREMITY N/A 12/03/2016   Procedure: Abdominal Aortogram w/Lower Extremity;  Surgeon: Chuck Hint, MD;  Location: [facility] INVASIVE CV LAB;  Service: Cardiovascular;  Laterality: N/A;   AORTA -INNOMIATE BYPASS N/A 05/18/2019   Procedure: AORTA -INNOMIATE BYPASS Using Hemashield Gold Graft Size 8mm;  Surgeon: Alleen Borne, MD;  Location: [facility];  Service: Open Heart Surgery;  Laterality: N/A;   aorto- fem bypass Bilateral    aortobiiliac bypass in 2000   DILATATION & CURETTAGE/HYSTEROSCOPY WITH MYOSURE N/A 12/27/2015   Procedure: DILATATION & CURETTAGE/HYSTEROSCOPY;  Surgeon: Patton Salles, MD;  Location: [facility];  Service: Gynecology;  Laterality: N/A;   DILATION AND CURETTAGE OF UTERUS  11/2015   ELBOW SURGERY Left ~2007   tendon repair by Dr. Ranell Patrick   EYE SURGERY Bilateral 2008   lasik   JOINT REPLACEMENT     lower aortic bypass  2000   per patient   PERIPHERAL VASCULAR INTERVENTION  12/03/2016   Stenting of anastomotic stenosis of the aortoiliac and right common iliac artery with 7 x 39 mm VBX covered stent and postdilated with 9 mm balloon.   Right innonimate bypass  05/18/2019   Aorto innominate bypass surgery with median sternotomy   STERNOTOMY  05/18/2019   Right aorto-innonimate bypass surgery 05/18/19 Dr. Laneta Simmers   TOTAL  HIP ARTHROPLASTY Right 07/27/2014   Procedure: RIGHT TOTAL HIP ARTHROPLASTY ANTERIOR APPROACH;  Surgeon: Durene Romans, MD;  Location: [facility];  Service: Orthopedics;  Laterality: Right;   TOTAL KNEE ARTHROPLASTY Right 05/28/2022   Procedure: RIGHT TOTAL KNEE ARTHROPLASTY;  Surgeon: Tarry Kos, MD;  Location: [facility];  Service: Orthopedics;  Laterality: Right;   Patient Active Problem List   Diagnosis Date Noted   Status post total right knee replacement 05/28/2022   Vitamin B12 deficiency 10/31/2021   Small bowel obstruction (HCC)    Partial small bowel obstruction (HCC) 10/28/2021   Primary osteoarthritis of right knee 06/20/2021   PAD (peripheral artery disease) (HCC)    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   Aortoiliac occlusive disease (HCC) 12/03/2016   Centrilobular emphysema (HCC) 10/03/2016   Right groin pain 02/27/2016   Sacroiliitis (HCC) 02/27/2016   Postmenopausal bleeding 12/01/2015   Psoas tendinitis of right side 10/21/2015   Endometrial thickening on ultra sound 07/28/2015   S/P right THA, 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   Type 2 diabetes mellitus (HCC) 05/18/2014   DDD (degenerative disc disease) 03/18/2013   Metabolic syndrome 07/29/2012   Essential hypertension, benign 07/18/2012   Hyperlipidemia 07/18/2012   Obstructive sleep apnea 07/04/2012   Osteoporosis 07/04/2012   Morbid obesity (HCC) 07/04/2012    PCP: Lula Olszewski MD   REFERRING PROVIDER: Neomia Dear PA   REFERRING DIAG: Right TKA  THERAPY DIAG:  Acute pain of right knee  Stiffness of right knee, not elsewhere classified  Other abnormalities of gait and mobility  Localized edema  Rationale for Evaluation and Treatment: Rehabilitation  ONSET DATE: 05/28/2022  SUBJECTIVE:   SUBJECTIVE STATEMENT: Patient is status post right TKA on 05/28/2022.  At this time her pain is well-controlled.  She is using a cane for primary mobility.  She does use a walker outside the home at times.  She is  having some difficulty falling asleep at night as would be expected.  PERTINENT HISTORY: Aortic stenosis; Pre-diabetic,  Gout ( hasn't had an attack in 1.5 years) Osteopetrosis,  right hip replacement; Left ebow surgery 2007;  PAIN:  Are you having pain? Yes: NPRS scale: 0/10 pain at this time has reached an 8/10  Pain location: right knee  Pain description: aching  Aggravating factors: night time;  Relieving factors: rest   PRECAUTIONS: None  WEIGHT BEARING RESTRICTIONS: No  FALLS:  Has patient fallen in last 6 months? No  LIVING ENVIRONMENT: 15  going to the second floor. 2 steps going in.   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     PLOF: Independent  PATIENT GOALS:   To get back to exercises   NEXT MD VISIT:   OBJECTIVE:   DIAGNOSTIC FINDINGS:   PATIENT SURVEYS:  FOTO    COGNITION: Overall cognitive status: Within functional limits for tasks assessed     SENSATION: WFL  EDEMA:  Circumferential: 43.5  MUSCLE LENGTH:  POSTURE: No Significant postural limitations  PALPATION: No unexpected tenderness to palpation   LOWER EXTREMITY ROM:  Passive ROM Right eval Left eval  Hip flexion    Hip extension    Hip abduction    Hip adduction    Hip internal rotation    Hip external rotation    Knee flexion 95   Knee extension -5   Ankle dorsiflexion    Ankle plantarflexion    Ankle inversion    Ankle eversion     (Blank rows = not tested)  LOWER EXTREMITY MMT:  MMT Right eval Left eval  Hip flexion 14.6 24.8  Hip extension    Hip abduction 23.5 32.8  Hip adduction    Hip internal rotation    Hip external rotation    Knee flexion    Knee extension  27.2  Ankle dorsiflexion    Ankle plantarflexion    Ankle inversion    Ankle eversion     (Blank rows = not tested)    FUNCTIONAL TESTS:  Mild use of hands for sit to stand    GAIT: Using single point cane; decreased single leg stance time on the right compared to the left.    TODAY'S TREATMENT:                                                                                                                              DATE:  Access Code: [email or web address] URL: [email or web address] Date: 06/14/2022 Prepared by: Onalee Hua  Zaineb Nowaczyk  Exercises - Supine Knee Extension Stretch on Towel Roll  - 2 x daily - 7 x weekly - 3 sets - 5 reps - 10 sec hold hold - Supine Heel Slide with Strap  - 2 x daily - 7 x weekly - 3 sets - 10 reps - 5 sec  hold - Supine Quad Set  - 2 x daily - 7 x weekly - 3 sets - 10 reps - Active Straight Leg Raise with Quad Set  - 1 x daily - 7 x weekly - 3 sets - 10 reps  PATIENT EDUCATION:  Education details: HEP, symptom management  Person educated: Patient Education method: Explanation, Demonstration, Tactile cues, Verbal cues, and Handouts Education comprehension: verbalized understanding, returned demonstration, verbal cues required, tactile cues required, and needs further education  HOME EXERCISE PROGRAM: Access Code: [email or web address] URL: [email or web address] Date: 06/13/2022 Prepared by: Lorayne Bender  Exercises - Supine Knee Extension Stretch on Towel Roll  - 2 x daily - 7 x weekly - 3 sets - 5 reps - 10 sec hold hold - Supine Heel Slide with Strap  - 2 x daily - 7 x weekly - 3 sets - 10 reps - 5 sec  hold - Supine Quad Set  - 2 x daily - 7 x weekly - 3 sets - 10 reps - Active Straight Leg Raise with Quad Set  - 1 x daily - 7 x weekly - 3 sets - 10 reps  ASSESSMENT:  CLINICAL IMPRESSION: Patient is a 67 year old female status post right TKA 05/28/2022.  Patient presents with expected limitations in range of motion strength and functional mobility.  Patient was very active prior to her surgery coming to the gym 5 days a week.  She is expected edema in her knee.  She would benefit from skilled therapy to improve her ability to return to the gym and ambulate in the community. OBJECTIVE IMPAIRMENTS: Abnormal gait, decreased  activity tolerance, decreased mobility, difficulty walking, decreased ROM, decreased strength, increased edema, and pain.   ACTIVITY LIMITATIONS: carrying, lifting, bending, sitting, standing, squatting, sleeping, stairs, transfers, and locomotion level  PARTICIPATION LIMITATIONS: meal prep, cleaning, laundry, driving, shopping, community activity, occupation, and yard work  PERSONAL FACTORS: 1-2 comorbidities: right hip replacement  are also affecting patient's functional outcome.   REHAB POTENTIAL: Excellent  CLINICAL DECISION MAKING: Stable/uncomplicated  EVALUATION COMPLEXITY: Low   GOALS: Goals reviewed with patient? Yes  SHORT TERM GOALS: Target date: 07/12/2022   Patient will increase right knee flexion to 110 degrees Baseline: Goal status: INITIAL  2.  Patient will demonstrate full passive right knee extension Baseline:  Goal status: INITIAL  3.  Patient will be independent with ambulation of a distance of 500 feet without a single-point cane Baseline:  Goal status: INITIAL  4.  Patient will be independent with basic exercise program Baseline:  Goal status: INITIAL  LONG TERM GOALS: Target date: 07/26/2022    Patient will go up and down 8 steps with reciprocal gait without pain Baseline:  Goal status: INITIAL  2.  Patient will demonstrate deep squat without pain in order to pick up items off the ground Baseline:  Goal status: INITIAL  3.  Patient will demonstrate full range of motion in order to perform ADLs without increased pain Baseline:  Goal status: INITIAL  4.  Patient will be independent with full appropriate gym program Baseline:  Goal status: INITIAL  PLAN:  PT FREQUENCY: 2x/week  PT DURATION: 8 weeks  PLANNED INTERVENTIONS: Therapeutic exercises,  Therapeutic activity, Neuromuscular re-education, Balance training, Gait training, Patient/Family education, Self Care, Joint mobilization, Stair training, DME instructions, Aquatic Therapy,  Cryotherapy, Moist heat, Taping, and Manual therapy  PLAN FOR NEXT SESSION: Passive range of motion for flexion and emphasis on extension.  Consider NuStep, consider standing weight shifting forward and back, consider standing slow March, begin stair training and squat training when able.   Dessie Coma, PT 06/14/2022, 8:32 AM

## 2022-06-20 ENCOUNTER — Ambulatory Visit (HOSPITAL_BASED_OUTPATIENT_CLINIC_OR_DEPARTMENT_OTHER): Payer: Medicare Other | Admitting: Physical Therapy

## 2022-06-20 ENCOUNTER — Encounter (HOSPITAL_BASED_OUTPATIENT_CLINIC_OR_DEPARTMENT_OTHER): Payer: Self-pay | Admitting: Physical Therapy

## 2022-06-20 DIAGNOSIS — R2689 Other abnormalities of gait and mobility: Secondary | ICD-10-CM

## 2022-06-20 DIAGNOSIS — M25561 Pain in right knee: Secondary | ICD-10-CM

## 2022-06-20 DIAGNOSIS — R600 Localized edema: Secondary | ICD-10-CM

## 2022-06-20 DIAGNOSIS — Z96651 Presence of right artificial knee joint: Secondary | ICD-10-CM | POA: Diagnosis not present

## 2022-06-20 DIAGNOSIS — M25661 Stiffness of right knee, not elsewhere classified: Secondary | ICD-10-CM

## 2022-06-20 NOTE — Therapy (Signed)
OUTPATIENT PHYSICAL THERAPY LOWER EXTREMITY EVALUATION   Patient Name: Diane FOUGERE MRN: [id number] DOB:23-Dec-1955, 67 y.o., female Today's Date: 06/20/2022  END OF SESSION:  PT End of Session - 06/20/22 1437     Visit Number 2    Number of Visits 16    Date for PT Re-Evaluation 08/09/22    PT Start Time 1434    PT Stop Time 1515    PT Time Calculation (min) 41 min    Activity Tolerance Patient tolerated treatment well    Behavior During Therapy [facility] for tasks assessed/performed             Past Medical History:  Diagnosis Date   Aortic stenosis    mild-moderate AS 04/22/19 echo   Aortoiliac occlusive disease (HCC) 12/03/2016   Arthritis    Diabetes mellitus without complication (HCC)    per pt she is pre- diabetic   Diverticulitis    Gout    Heart murmur    Hyperlipidemia    Hypertension    Osteoporosis    Sleep apnea    Tobacco use disorder 07/04/2012   Past Surgical History:  Procedure Laterality Date   ABDOMINAL AORTOGRAM W/LOWER EXTREMITY N/A 12/03/2016   Procedure: Abdominal Aortogram w/Lower Extremity;  Surgeon: Chuck Hint, MD;  Location: [facility] INVASIVE CV LAB;  Service: Cardiovascular;  Laterality: N/A;   AORTA -INNOMIATE BYPASS N/A 05/18/2019   Procedure: AORTA -INNOMIATE BYPASS Using Hemashield Gold Graft Size 8mm;  Surgeon: Alleen Borne, MD;  Location: [facility] OR;  Service: Open Heart Surgery;  Laterality: N/A;   aorto- fem bypass Bilateral    aortobiiliac bypass in 2000   DILATATION & CURETTAGE/HYSTEROSCOPY WITH MYOSURE N/A 12/27/2015   Procedure: DILATATION & CURETTAGE/HYSTEROSCOPY;  Surgeon: Patton Salles, MD;  Location: [facility];  Service: Gynecology;  Laterality: N/A;   DILATION AND CURETTAGE OF UTERUS  11/2015   ELBOW SURGERY Left ~2007   tendon repair by Dr. Ranell Patrick   EYE SURGERY Bilateral 2008   lasik   JOINT REPLACEMENT     lower aortic bypass  2000   per patient   PERIPHERAL VASCULAR INTERVENTION  12/03/2016    Stenting of anastomotic stenosis of the aortoiliac and right common iliac artery with 7 x 39 mm VBX covered stent and postdilated with 9 mm balloon.   Right innonimate bypass  05/18/2019   Aorto innominate bypass surgery with median sternotomy   STERNOTOMY  05/18/2019   Right aorto-innonimate bypass surgery 05/18/19 Dr. Laneta Simmers   TOTAL HIP ARTHROPLASTY Right 07/27/2014   Procedure: RIGHT TOTAL HIP ARTHROPLASTY ANTERIOR APPROACH;  Surgeon: Durene Romans, MD;  Location: [facility];  Service: Orthopedics;  Laterality: Right;   TOTAL KNEE ARTHROPLASTY Right 05/28/2022   Procedure: RIGHT TOTAL KNEE ARTHROPLASTY;  Surgeon: Tarry Kos, MD;  Location: [facility];  Service: Orthopedics;  Laterality: Right;   Patient Active Problem List   Diagnosis Date Noted   Status post total right knee replacement 05/28/2022   Vitamin B12 deficiency 10/31/2021   Small bowel obstruction (HCC)    Partial small bowel obstruction (HCC) 10/28/2021   Primary osteoarthritis of right knee 06/20/2021   PAD (peripheral artery disease) (HCC)    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   Aortoiliac occlusive disease (HCC) 12/03/2016   Centrilobular emphysema (HCC) 10/03/2016   Right groin pain 02/27/2016   Sacroiliitis (HCC) 02/27/2016   Postmenopausal bleeding 12/01/2015   Psoas tendinitis of right side 10/21/2015   Endometrial thickening on ultra sound 07/28/2015  S/P right THA, 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   Type 2 diabetes mellitus (HCC) 05/18/2014   DDD (degenerative disc disease) 03/18/2013   Metabolic syndrome 07/29/2012   Essential hypertension, benign 07/18/2012   Hyperlipidemia 07/18/2012   Obstructive sleep apnea 07/04/2012   Osteoporosis 07/04/2012   Morbid obesity (HCC) 07/04/2012    PCP: Lula Olszewski MD   REFERRING PROVIDER: Neomia Dear PA   REFERRING DIAG: Right TKA  THERAPY DIAG:  Acute pain of right knee  Stiffness of right knee, not elsewhere classified  Other abnormalities of gait and  mobility  Localized edema  Rationale for Evaluation and Treatment: Rehabilitation  ONSET DATE: 05/28/2022  SUBJECTIVE:   SUBJECTIVE STATEMENT: The patient reports minor soreness over the past few days She has bene standing to cook.   PERTINENT HISTORY: Aortic stenosis; Pre-diabetic,  Gout ( hasn't had an attack in 1.5 years) Osteopetrosis,  right hip replacement; Left ebow surgery 2007;  PAIN:  Are you having pain? Yes: NPRS scale: 0/10 pain at this time has reached an 8/10  Pain location: right knee  Pain description: aching  Aggravating factors: night time;  Relieving factors: rest   PRECAUTIONS: None  WEIGHT BEARING RESTRICTIONS: No  FALLS:  Has patient fallen in last 6 months? No  LIVING ENVIRONMENT: 15  going to the second floor. 2 steps going in.   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     PLOF: Independent  PATIENT GOALS:   To get back to exercises   NEXT MD VISIT:   OBJECTIVE:   DIAGNOSTIC FINDINGS:   PATIENT SURVEYS:  FOTO    COGNITION: Overall cognitive status: Within functional limits for tasks assessed     SENSATION: WFL  EDEMA:  Circumferential: 43.5  MUSCLE LENGTH:  POSTURE: No Significant postural limitations  PALPATION: No unexpected tenderness to palpation   LOWER EXTREMITY ROM:  Passive ROM Right eval Left eval  Hip flexion    Hip extension    Hip abduction    Hip adduction    Hip internal rotation    Hip external rotation    Knee flexion 95 100  Knee extension -5 -6  Ankle dorsiflexion    Ankle plantarflexion    Ankle inversion    Ankle eversion     (Blank rows = not tested)  LOWER EXTREMITY MMT:  MMT Right eval Left eval  Hip flexion 14.6 24.8  Hip extension    Hip abduction 23.5 32.8  Hip adduction    Hip internal rotation    Hip external rotation    Knee flexion    Knee extension  27.2  Ankle dorsiflexion    Ankle plantarflexion    Ankle inversion    Ankle eversion     (Blank rows =  not tested)    FUNCTIONAL TESTS:  Mild use of hands for sit to stand    GAIT: Using single point cane; decreased single leg stance time on the right compared to the left.   TODAY'S TREATMENT:  DATE:  2/14 Quad set x15  SLR 2x15  LAQ in available range 2x15  Standing heel raise x20  Standing low march x20   Manual: trigger point release posterior knee; Passive ROM into extension and flexion. Patella mobilization;      Eval  Access Code: [email or web address] URL: [email or web address] Date: 06/14/2022 Prepared by: Lorayne Bender  Exercises - Supine Knee Extension Stretch on Towel Roll  - 2 x daily - 7 x weekly - 3 sets - 5 reps - 10 sec hold hold - Supine Heel Slide with Strap  - 2 x daily - 7 x weekly - 3 sets - 10 reps - 5 sec  hold - Supine Quad Set  - 2 x daily - 7 x weekly - 3 sets - 10 reps - Active Straight Leg Raise with Quad Set  - 1 x daily - 7 x weekly - 3 sets - 10 reps  PATIENT EDUCATION:  Education details: HEP, symptom management  Person educated: Patient Education method: Explanation, Demonstration, Tactile cues, Verbal cues, and Handouts Education comprehension: verbalized understanding, returned demonstration, verbal cues required, tactile cues required, and needs further education  HOME EXERCISE PROGRAM: Access Code: [email or web address] URL: [email or web address] Date: 06/13/2022 Prepared by: Lorayne Bender  Exercises - Supine Knee Extension Stretch on Towel Roll  - 2 x daily - 7 x weekly - 3 sets - 5 reps - 10 sec hold hold - Supine Heel Slide with Strap  - 2 x daily - 7 x weekly - 3 sets - 10 reps - 5 sec  hold - Supine Quad Set  - 2 x daily - 7 x weekly - 3 sets - 10 reps - Active Straight Leg Raise with Quad Set  - 1 x daily - 7 x weekly - 3 sets - 10 reps  ASSESSMENT:  CLINICAL IMPRESSION: The patient is making  good progress. Her Flexion range improved. She was advised to continue working on her extension. We reviewed standing exercises today and updated her HEP. We will continue to progress towards functional strengthening as tolerated.     OBJECTIVE IMPAIRMENTS: Abnormal gait, decreased activity tolerance, decreased mobility, difficulty walking, decreased ROM, decreased strength, increased edema, and pain.   ACTIVITY LIMITATIONS: carrying, lifting, bending, sitting, standing, squatting, sleeping, stairs, transfers, and locomotion level  PARTICIPATION LIMITATIONS: meal prep, cleaning, laundry, driving, shopping, community activity, occupation, and yard work  PERSONAL FACTORS: 1-2 comorbidities: right hip replacement  are also affecting patient's functional outcome.   REHAB POTENTIAL: Excellent  CLINICAL DECISION MAKING: Stable/uncomplicated  EVALUATION COMPLEXITY: Low   GOALS: Goals reviewed with patient? Yes  SHORT TERM GOALS: Target date: 07/12/2022   Patient will increase right knee flexion to 110 degrees Baseline: Goal status: INITIAL  2.  Patient will demonstrate full passive right knee extension Baseline:  Goal status: INITIAL  3.  Patient will be independent with ambulation of a distance of 500 feet without a single-point cane Baseline:  Goal status: INITIAL  4.  Patient will be independent with basic exercise program Baseline:  Goal status: INITIAL  LONG TERM GOALS: Target date: 07/26/2022    Patient will go up and down 8 steps with reciprocal gait without pain Baseline:  Goal status: INITIAL  2.  Patient will demonstrate deep squat without pain in order to pick up items off the ground Baseline:  Goal status: INITIAL  3.  Patient will demonstrate full range of motion in order to perform ADLs without increased pain Baseline:  Goal status: INITIAL  4.  Patient will be independent with full appropriate gym program Baseline:  Goal status: INITIAL  PLAN:  PT  FREQUENCY: 2x/week  PT DURATION: 8 weeks  PLANNED INTERVENTIONS: Therapeutic exercises, Therapeutic activity, Neuromuscular re-education, Balance training, Gait training, Patient/Family education, Self Care, Joint mobilization, Stair training, DME instructions, Aquatic Therapy, Cryotherapy, Moist heat, Taping, and Manual therapy  PLAN FOR NEXT SESSION: Passive range of motion for flexion and emphasis on extension.  Consider NuStep, consider standing weight shifting forward and back, consider standing slow March, begin stair training and squat training when able.   Dessie Coma, PT 06/20/2022, 2:52 PM

## 2022-06-22 ENCOUNTER — Encounter (HOSPITAL_BASED_OUTPATIENT_CLINIC_OR_DEPARTMENT_OTHER): Payer: Self-pay | Admitting: Physical Therapy

## 2022-06-22 ENCOUNTER — Ambulatory Visit (HOSPITAL_BASED_OUTPATIENT_CLINIC_OR_DEPARTMENT_OTHER): Payer: Medicare Other | Admitting: Physical Therapy

## 2022-06-22 DIAGNOSIS — R600 Localized edema: Secondary | ICD-10-CM

## 2022-06-22 DIAGNOSIS — M25561 Pain in right knee: Secondary | ICD-10-CM

## 2022-06-22 DIAGNOSIS — M25661 Stiffness of right knee, not elsewhere classified: Secondary | ICD-10-CM

## 2022-06-22 DIAGNOSIS — R2689 Other abnormalities of gait and mobility: Secondary | ICD-10-CM

## 2022-06-22 DIAGNOSIS — Z96651 Presence of right artificial knee joint: Secondary | ICD-10-CM | POA: Diagnosis not present

## 2022-06-22 NOTE — Therapy (Signed)
OUTPATIENT PHYSICAL THERAPY LOWER EXTREMITY EVALUATION   Patient Name: Diane Giles MRN: [id number] DOB:[birth year], 67 y.o., female Today's Date: 06/22/2022  END OF SESSION:  PT End of Session - 06/22/22 0844     Visit Number 3    Number of Visits 16    Date for PT Re-Evaluation 08/09/22    PT Start Time 0845    PT Stop Time 0925    PT Time Calculation (min) 40 min    Activity Tolerance Patient tolerated treatment well    Behavior During Therapy [facility] for tasks assessed/performed             Past Medical History:  Diagnosis Date   Aortic stenosis    mild-moderate AS 04/22/19 echo   Aortoiliac occlusive disease (HCC) 12/03/2016   Arthritis    Diabetes mellitus without complication (HCC)    per pt she is pre- diabetic   Diverticulitis    Gout    Heart murmur    Hyperlipidemia    Hypertension    Osteoporosis    Sleep apnea    Tobacco use disorder 07/04/2012   Past Surgical History:  Procedure Laterality Date   ABDOMINAL AORTOGRAM W/LOWER EXTREMITY N/A 12/03/2016   Procedure: Abdominal Aortogram w/Lower Extremity;  Surgeon: Chuck Hint, MD;  Location: [facility] INVASIVE CV LAB;  Service: Cardiovascular;  Laterality: N/A;   AORTA -INNOMIATE BYPASS N/A 05/18/2019   Procedure: AORTA -INNOMIATE BYPASS Using Hemashield Gold Graft Size 8mm;  Surgeon: Alleen Borne, MD;  Location: [facility] OR;  Service: Open Heart Surgery;  Laterality: N/A;   aorto- fem bypass Bilateral    aortobiiliac bypass in 2000   DILATATION & CURETTAGE/HYSTEROSCOPY WITH MYOSURE N/A 12/27/2015   Procedure: DILATATION & CURETTAGE/HYSTEROSCOPY;  Surgeon: Patton Salles, MD;  Location: [facility] ORS;  Service: Gynecology;  Laterality: N/A;   DILATION AND CURETTAGE OF UTERUS  11/2015   ELBOW SURGERY Left ~2007   tendon repair by Dr. Ranell Patrick   EYE SURGERY Bilateral 2008   lasik   JOINT REPLACEMENT     lower aortic bypass  2000   per patient   PERIPHERAL VASCULAR INTERVENTION  12/03/2016    Stenting of anastomotic stenosis of the aortoiliac and right common iliac artery with 7 x 39 mm VBX covered stent and postdilated with 9 mm balloon.   Right innonimate bypass  05/18/2019   Aorto innominate bypass surgery with median sternotomy   STERNOTOMY  05/18/2019   Right aorto-innonimate bypass surgery 05/18/19 Dr. Laneta Simmers   TOTAL HIP ARTHROPLASTY Right 07/27/2014   Procedure: RIGHT TOTAL HIP ARTHROPLASTY ANTERIOR APPROACH;  Surgeon: Durene Romans, MD;  Location: [facility] ORS;  Service: Orthopedics;  Laterality: Right;   TOTAL KNEE ARTHROPLASTY Right 05/28/2022   Procedure: RIGHT TOTAL KNEE ARTHROPLASTY;  Surgeon: Tarry Kos, MD;  Location: [facility] OR;  Service: Orthopedics;  Laterality: Right;   Patient Active Problem List   Diagnosis Date Noted   Status post total right knee replacement 05/28/2022   Vitamin B12 deficiency 10/31/2021   Small bowel obstruction (HCC)    Partial small bowel obstruction (HCC) 10/28/2021   Primary osteoarthritis of right knee 06/20/2021   PAD (peripheral artery disease) (HCC)    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   Aortoiliac occlusive disease (HCC) 12/03/2016   Centrilobular emphysema (HCC) 10/03/2016   Right groin pain 02/27/2016   Sacroiliitis (HCC) 02/27/2016   Postmenopausal bleeding 12/01/2015   Psoas tendinitis of right side 10/21/2015   Endometrial thickening on ultra sound 07/28/2015  S/P right THA, 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   Type 2 diabetes mellitus (HCC) 05/18/2014   DDD (degenerative disc disease) 03/18/2013   Metabolic syndrome 07/29/2012   Essential hypertension, benign 07/18/2012   Hyperlipidemia 07/18/2012   Obstructive sleep apnea 07/04/2012   Osteoporosis 07/04/2012   Morbid obesity (HCC) 07/04/2012    PCP: Lula Olszewski MD   REFERRING PROVIDER: Neomia Dear PA   REFERRING DIAG: Right TKA  THERAPY DIAG:  Acute pain of right knee  Stiffness of right knee, not elsewhere classified  Other abnormalities of gait and  mobility  Localized edema  Rationale for Evaluation and Treatment: Rehabilitation  ONSET DATE: 05/28/2022 Days since surgery: 25   SUBJECTIVE:   SUBJECTIVE STATEMENT: Pt states that the knee is bothering her today. She was a little more active yesterday that increased pain. She does a movement that bothers it that causes it to hurt for about an hour and then it comes down. It happens on the way here getting into the car.   PERTINENT HISTORY: Aortic stenosis; Pre-diabetic,  Gout ( hasn't had an attack in 1.5 years) Osteopetrosis,  right hip replacement; Left ebow surgery 2007;  PAIN:  Are you having pain? Yes: NPRS scale: 0/10 pain at this time has reached an 8/10  Pain location: right knee  Pain description: aching  Aggravating factors: night time;  Relieving factors: rest   PRECAUTIONS: None  WEIGHT BEARING RESTRICTIONS: No  FALLS:  Has patient fallen in last 6 months? No  LIVING ENVIRONMENT: 15  going to the second floor. 2 steps going in.   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     PLOF: Independent  PATIENT GOALS:   To get back to exercises   NEXT MD VISIT:   OBJECTIVE:   DIAGNOSTIC FINDINGS:   PATIENT SURVEYS:  FOTO    COGNITION: Overall cognitive status: Within functional limits for tasks assessed     SENSATION: WFL  EDEMA:  Circumferential: 43.5  MUSCLE LENGTH:  POSTURE: No Significant postural limitations  PALPATION: No unexpected tenderness to palpation   LOWER EXTREMITY ROM:  Passive ROM Right eval Left eval R 2/16  Hip flexion     Hip extension     Hip abduction     Hip adduction     Hip internal rotation     Hip external rotation     Knee flexion 95 100 102  Knee extension -5 -6 -5  Ankle dorsiflexion     Ankle plantarflexion     Ankle inversion     Ankle eversion      (Blank rows = not tested)  LOWER EXTREMITY MMT:  MMT Right eval Left eval  Hip flexion 14.6 24.8  Hip extension    Hip abduction 23.5  32.8  Hip adduction    Hip internal rotation    Hip external rotation    Knee flexion    Knee extension  27.2  Ankle dorsiflexion    Ankle plantarflexion    Ankle inversion    Ankle eversion     (Blank rows = not tested)    FUNCTIONAL TESTS:  Mild use of hands for sit to stand    GAIT: Using single point cane; decreased single leg stance time on the right compared to the left.   TODAY'S TREATMENT:  DATE:  2/16  PROM flexion and ext to tolerance Knee ext mob grade II-III on R  Quad set with heel prop 3s 4x5 Clamshell RTB 2x10 (hooklying) unable to do S/L due to pain at knee Seated tailgate flexion stretch 5s 10x SLR 3x10 High table STS 3x5 LAQ 3x10  2/14 Quad set x15  SLR 2x15  LAQ in available range 2x15  Standing heel raise x20  Standing low march x20   Manual: trigger point release posterior knee; Passive ROM into extension and flexion. Patella mobilization;    Eval  Access Code: [email or web address] URL: [email or web address] Date: 06/14/2022 Prepared by: Lorayne Bender  Exercises - Supine Knee Extension Stretch on Towel Roll  - 2 x daily - 7 x weekly - 3 sets - 5 reps - 10 sec hold hold - Supine Heel Slide with Strap  - 2 x daily - 7 x weekly - 3 sets - 10 reps - 5 sec  hold - Supine Quad Set  - 2 x daily - 7 x weekly - 3 sets - 10 reps - Active Straight Leg Raise with Quad Set  - 1 x daily - 7 x weekly - 3 sets - 10 reps  PATIENT EDUCATION:  Education details: HEP, symptom management  Person educated: Patient Education method: Explanation, Demonstration, Tactile cues, Verbal cues, and Handouts Education comprehension: verbalized understanding, returned demonstration, verbal cues required, tactile cues required, and needs further education  HOME EXERCISE PROGRAM: Access Code: [email or web address] URL:  [email or web address] Date: 06/13/2022 Prepared by: Lorayne Bender  Exercises - Supine Knee Extension Stretch on Towel Roll  - 2 x daily - 7 x weekly - 3 sets - 5 reps - 10 sec hold hold - Supine Heel Slide with Strap  - 2 x daily - 7 x weekly - 3 sets - 10 reps - 5 sec  hold - Supine Quad Set  - 2 x daily - 7 x weekly - 3 sets - 10 reps - Active Straight Leg Raise with Quad Set  - 1 x daily - 7 x weekly - 3 sets - 10 reps  ASSESSMENT:  CLINICAL IMPRESSION: Pt able to progress knee flexion today but is still stiff into ext. Pt was limited by discomfort today that appears consistent with report of increase in standing activity. Pt was advised that she may begin UE exercise, contralateral limb exercise, graded exposure to walking, and aquatic walking once the wound is closed. Pt is making good progress with ROM and muscle activation at this time. Plan to progress with ROM and strength as tolerated. Consider addition of balance exercises at next Pt would benefit from continued skilled therapy in order to reach goals and maximize functional R LE strength and ROM for full return to PLOF.     OBJECTIVE IMPAIRMENTS: Abnormal gait, decreased activity tolerance, decreased mobility, difficulty walking, decreased ROM, decreased strength, increased edema, and pain.   ACTIVITY LIMITATIONS: carrying, lifting, bending, sitting, standing, squatting, sleeping, stairs, transfers, and locomotion level  PARTICIPATION LIMITATIONS: meal prep, cleaning, laundry, driving, shopping, community activity, occupation, and yard work  PERSONAL FACTORS: 1-2 comorbidities: right hip replacement  are also affecting patient's functional outcome.   REHAB POTENTIAL: Excellent  CLINICAL DECISION MAKING: Stable/uncomplicated  EVALUATION COMPLEXITY: Low   GOALS: Goals reviewed with patient? Yes  SHORT TERM GOALS: Target date: 07/12/2022   Patient will increase right knee flexion to 110  degrees Baseline: Goal status: INITIAL  2.  Patient will demonstrate full passive right knee extension Baseline:  Goal status:  INITIAL  3.  Patient will be independent with ambulation of a distance of 500 feet without a single-point cane Baseline:  Goal status: INITIAL  4.  Patient will be independent with basic exercise program Baseline:  Goal status: INITIAL  LONG TERM GOALS: Target date: 07/26/2022    Patient will go up and down 8 steps with reciprocal gait without pain Baseline:  Goal status: INITIAL  2.  Patient will demonstrate deep squat without pain in order to pick up items off the ground Baseline:  Goal status: INITIAL  3.  Patient will demonstrate full range of motion in order to perform ADLs without increased pain Baseline:  Goal status: INITIAL  4.  Patient will be independent with full appropriate gym program Baseline:  Goal status: INITIAL  PLAN:  PT FREQUENCY: 2x/week  PT DURATION: 8 weeks  PLANNED INTERVENTIONS: Therapeutic exercises, Therapeutic activity, Neuromuscular re-education, Balance training, Gait training, Patient/Family education, Self Care, Joint mobilization, Stair training, DME instructions, Aquatic Therapy, Cryotherapy, Moist heat, Taping, and Manual therapy  PLAN FOR NEXT SESSION: Passive range of motion for flexion and emphasis on extension.  Consider NuStep, start balance training,  begin stair training and squat training when able.   Zebedee Iba PT, DPT 06/22/22 9:28 AM

## 2022-06-25 ENCOUNTER — Encounter (HOSPITAL_BASED_OUTPATIENT_CLINIC_OR_DEPARTMENT_OTHER): Payer: Self-pay | Admitting: Physical Therapy

## 2022-06-25 ENCOUNTER — Ambulatory Visit (HOSPITAL_BASED_OUTPATIENT_CLINIC_OR_DEPARTMENT_OTHER): Payer: Medicare Other | Admitting: Physical Therapy

## 2022-06-25 DIAGNOSIS — M25561 Pain in right knee: Secondary | ICD-10-CM

## 2022-06-25 DIAGNOSIS — Z96651 Presence of right artificial knee joint: Secondary | ICD-10-CM | POA: Diagnosis not present

## 2022-06-25 DIAGNOSIS — R2689 Other abnormalities of gait and mobility: Secondary | ICD-10-CM

## 2022-06-25 DIAGNOSIS — M25661 Stiffness of right knee, not elsewhere classified: Secondary | ICD-10-CM

## 2022-06-25 DIAGNOSIS — R600 Localized edema: Secondary | ICD-10-CM

## 2022-06-25 NOTE — Therapy (Unsigned)
OUTPATIENT PHYSICAL THERAPY LOWER EXTREMITY Treatment    Patient Name: Diane Giles MRN: [id number] DOB:[birth year], 67 y.o., female Today's Date: 06/22/2022  END OF SESSION:  PT End of Session - 06/22/22 0844     Visit Number 3    Number of Visits 16    Date for PT Re-Evaluation 08/09/22    PT Start Time 0845    PT Stop Time 0925    PT Time Calculation (min) 40 min    Activity Tolerance Patient tolerated treatment well    Behavior During Therapy [facility] for tasks assessed/performed             Past Medical History:  Diagnosis Date   Aortic stenosis    mild-moderate AS 04/22/19 echo   Aortoiliac occlusive disease (HCC) 12/03/2016   Arthritis    Diabetes mellitus without complication (HCC)    per pt she is pre- diabetic   Diverticulitis    Gout    Heart murmur    Hyperlipidemia    Hypertension    Osteoporosis    Sleep apnea    Tobacco use disorder 07/04/2012   Past Surgical History:  Procedure Laterality Date   ABDOMINAL AORTOGRAM W/LOWER EXTREMITY N/A 12/03/2016   Procedure: Abdominal Aortogram w/Lower Extremity;  Surgeon: Chuck Hint, MD;  Location: [facility] INVASIVE CV LAB;  Service: Cardiovascular;  Laterality: N/A;   AORTA -INNOMIATE BYPASS N/A 05/18/2019   Procedure: AORTA -INNOMIATE BYPASS Using Hemashield Gold Graft Size 8mm;  Surgeon: Alleen Borne, MD;  Location: [facility] OR;  Service: Open Heart Surgery;  Laterality: N/A;   aorto- fem bypass Bilateral    aortobiiliac bypass in 2000   DILATATION & CURETTAGE/HYSTEROSCOPY WITH MYOSURE N/A 12/27/2015   Procedure: DILATATION & CURETTAGE/HYSTEROSCOPY;  Surgeon: Patton Salles, MD;  Location: [facility] ORS;  Service: Gynecology;  Laterality: N/A;   DILATION AND CURETTAGE OF UTERUS  11/2015   ELBOW SURGERY Left ~2007   tendon repair by Dr. Ranell Patrick   EYE SURGERY Bilateral 2008   lasik   JOINT REPLACEMENT     lower aortic bypass  2000   per patient   PERIPHERAL VASCULAR INTERVENTION  12/03/2016    Stenting of anastomotic stenosis of the aortoiliac and right common iliac artery with 7 x 39 mm VBX covered stent and postdilated with 9 mm balloon.   Right innonimate bypass  05/18/2019   Aorto innominate bypass surgery with median sternotomy   STERNOTOMY  05/18/2019   Right aorto-innonimate bypass surgery 05/18/19 Dr. Laneta Simmers   TOTAL HIP ARTHROPLASTY Right 07/27/2014   Procedure: RIGHT TOTAL HIP ARTHROPLASTY ANTERIOR APPROACH;  Surgeon: Durene Romans, MD;  Location: [facility]S;  Service: Orthopedics;  Laterality: Right;   TOTAL KNEE ARTHROPLASTY Right 05/28/2022   Procedure: RIGHT TOTAL KNEE ARTHROPLASTY;  Surgeon: Tarry Kos, MD;  Location: [facility] OR;  Service: Orthopedics;  Laterality: Right;   Patient Active Problem List   Diagnosis Date Noted   Status post total right knee replacement 05/28/2022   Vitamin B12 deficiency 10/31/2021   Small bowel obstruction (HCC)    Partial small bowel obstruction (HCC) 10/28/2021   Primary osteoarthritis of right knee 06/20/2021   PAD (peripheral artery disease) (HCC)    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   Aortoiliac occlusive disease (HCC) 12/03/2016   Centrilobular emphysema (HCC) 10/03/2016   Right groin pain 02/27/2016   Sacroiliitis (HCC) 02/27/2016   Postmenopausal bleeding 12/01/2015   Psoas tendinitis of right side 10/21/2015   Endometrial thickening on ultra sound  07/28/2015   S/P right THA, 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   Type 2 diabetes mellitus (HCC) 05/18/2014   DDD (degenerative disc disease) 03/18/2013   Metabolic syndrome 07/29/2012   Essential hypertension, benign 07/18/2012   Hyperlipidemia 07/18/2012   Obstructive sleep apnea 07/04/2012   Osteoporosis 07/04/2012   Morbid obesity (HCC) 07/04/2012    PCP: Lula Olszewski MD   REFERRING PROVIDER: Neomia Dear PA   REFERRING DIAG: Right TKA  THERAPY DIAG:  Acute pain of right knee  Stiffness of right knee, not elsewhere classified  Other abnormalities of gait and  mobility  Localized edema  Rationale for Evaluation and Treatment: Rehabilitation  ONSET DATE: 05/28/2022 Days since surgery: 25   SUBJECTIVE:   SUBJECTIVE STATEMENT: The patient did well after the last visit. Overall she feels like she is doing well. She is a little sore. She is having some trouble with her sitting knee stretch.   PERTINENT HISTORY: Aortic stenosis; Pre-diabetic,  Gout ( hasn't had an attack in 1.5 years) Osteopetrosis,  right hip replacement; Left ebow surgery 2007;  PAIN:  Are you having pain? Yes: NPRS scale: 0/10 pain at this time has reached an 8/10  Pain location: right knee  Pain description: aching  Aggravating factors: night time;  Relieving factors: rest   PRECAUTIONS: None  WEIGHT BEARING RESTRICTIONS: No  FALLS:  Has patient fallen in last 6 months? No  LIVING ENVIRONMENT: 15  going to the second floor. 2 steps going in.   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     PLOF: Independent  PATIENT GOALS:   To get back to exercises   NEXT MD VISIT:   OBJECTIVE:   DIAGNOSTIC FINDINGS:   PATIENT SURVEYS:  FOTO    COGNITION: Overall cognitive status: Within functional limits for tasks assessed     SENSATION: WFL  EDEMA:  Circumferential: 43.5  MUSCLE LENGTH:  POSTURE: No Significant postural limitations  PALPATION: No unexpected tenderness to palpation   LOWER EXTREMITY ROM:  Passive ROM Right eval Left eval R 2/16 2/19  Hip flexion      Hip extension      Hip abduction      Hip adduction      Hip internal rotation      Hip external rotation      Knee flexion 95 100 102 110  Knee extension -5 -6 -5 -3  Ankle dorsiflexion      Ankle plantarflexion      Ankle inversion      Ankle eversion       (Blank rows = not tested)  LOWER EXTREMITY MMT:  MMT Right eval Left eval  Hip flexion 14.6 24.8  Hip extension    Hip abduction 23.5 32.8  Hip adduction    Hip internal rotation    Hip external  rotation    Knee flexion    Knee extension  27.2  Ankle dorsiflexion    Ankle plantarflexion    Ankle inversion    Ankle eversion     (Blank rows = not tested)    FUNCTIONAL TESTS:  Mild use of hands for sit to stand    GAIT: Using single point cane; decreased single leg stance time on the right compared to the left.   TODAY'S TREATMENT:  DATE:  2/19 PROM flexion and ext to tolerance Knee ext mob grade II-III on R Quad set 2x15  SLR 3x10   SAQ 3x15 1.5 lbs   Step up 4 inch 2x10 reviewed where she can do in the gym  LAQ 3x10  Reviewed self knee stretch. Patient doing correctly. She was having pain but she also gained 8 degrees of motion.     2/16  PROM flexion and ext to tolerance Knee ext mob grade II-III on R  Quad set with heel prop 3s 4x5 Clamshell RTB 2x10 (hooklying) unable to do S/L due to pain at knee Seated tailgate flexion stretch 5s 10x SLR 3x10 High table STS 3x5 LAQ 3x10  2/14 Quad set x15  SLR 2x15  LAQ in available range 2x15  Standing heel raise x20  Standing low march x20   Manual: trigger point release posterior knee; Passive ROM into extension and flexion. Patella mobilization;    PATIENT EDUCATION:  Education details: HEP, symptom management  Person educated: Patient Education method: Explanation, Demonstration, Tactile cues, Verbal cues, and Handouts Education comprehension: verbalized understanding, returned demonstration, verbal cues required, tactile cues required, and needs further education  HOME EXERCISE PROGRAM: Access Code: [email or web address] URL: [email or web address] Date: 06/13/2022 Prepared by: Lorayne Bender  Exercises - Supine Knee Extension Stretch on Towel Roll  - 2 x daily - 7 x weekly - 3 sets - 5 reps - 10 sec hold hold - Supine Heel Slide with Strap  - 2 x daily - 7 x weekly - 3  sets - 10 reps - 5 sec  hold - Supine Quad Set  - 2 x daily - 7 x weekly - 3 sets - 10 reps - Active Straight Leg Raise with Quad Set  - 1 x daily - 7 x weekly - 3 sets - 10 reps  ASSESSMENT:  CLINICAL IMPRESSION: The patient is making great progress. She had a significant jump in knee flexion today. Her extension improved as well. She tolerated there-ex well. We initiated stair training today. We will continue to progress as tolerated. She had no significant pain.     OBJECTIVE IMPAIRMENTS: Abnormal gait, decreased activity tolerance, decreased mobility, difficulty walking, decreased ROM, decreased strength, increased edema, and pain.   ACTIVITY LIMITATIONS: carrying, lifting, bending, sitting, standing, squatting, sleeping, stairs, transfers, and locomotion level  PARTICIPATION LIMITATIONS: meal prep, cleaning, laundry, driving, shopping, community activity, occupation, and yard work  PERSONAL FACTORS: 1-2 comorbidities: right hip replacement  are also affecting patient's functional outcome.   REHAB POTENTIAL: Excellent  CLINICAL DECISION MAKING: Stable/uncomplicated  EVALUATION COMPLEXITY: Low   GOALS: Goals reviewed with patient? Yes  SHORT TERM GOALS: Target date: 07/12/2022   Patient will increase right knee flexion to 110 degrees Baseline: Goal status: INITIAL  2.  Patient will demonstrate full passive right knee extension Baseline:  Goal status: INITIAL  3.  Patient will be independent with ambulation of a distance of 500 feet without a single-point cane Baseline:  Goal status: INITIAL  4.  Patient will be independent with basic exercise program Baseline:  Goal status: INITIAL  LONG TERM GOALS: Target date: 07/26/2022    Patient will go up and down 8 steps with reciprocal gait without pain Baseline:  Goal status: INITIAL  2.  Patient will demonstrate deep squat without pain in order to pick up items off the ground Baseline:  Goal status: INITIAL  3.   Patient will demonstrate full range of motion in order to perform ADLs without increased  pain Baseline:  Goal status: INITIAL  4.  Patient will be independent with full appropriate gym program Baseline:  Goal status: INITIAL  PLAN:  PT FREQUENCY: 2x/week  PT DURATION: 8 weeks  PLANNED INTERVENTIONS: Therapeutic exercises, Therapeutic activity, Neuromuscular re-education, Balance training, Gait training, Patient/Family education, Self Care, Joint mobilization, Stair training, DME instructions, Aquatic Therapy, Cryotherapy, Moist heat, Taping, and Manual therapy  PLAN FOR NEXT SESSION: Passive range of motion for flexion and emphasis on extension.  Consider NuStep, start balance training,  begin stair training and squat training when able.   Zebedee Iba PT, DPT 06/22/22 9:28 AM

## 2022-06-26 ENCOUNTER — Encounter (HOSPITAL_BASED_OUTPATIENT_CLINIC_OR_DEPARTMENT_OTHER): Payer: Self-pay | Admitting: Physical Therapy

## 2022-06-28 ENCOUNTER — Ambulatory Visit (HOSPITAL_BASED_OUTPATIENT_CLINIC_OR_DEPARTMENT_OTHER): Payer: Medicare Other | Admitting: Physical Therapy

## 2022-06-28 ENCOUNTER — Encounter (HOSPITAL_BASED_OUTPATIENT_CLINIC_OR_DEPARTMENT_OTHER): Payer: Self-pay | Admitting: Physical Therapy

## 2022-06-28 DIAGNOSIS — M25561 Pain in right knee: Secondary | ICD-10-CM

## 2022-06-28 DIAGNOSIS — R2689 Other abnormalities of gait and mobility: Secondary | ICD-10-CM

## 2022-06-28 DIAGNOSIS — Z96651 Presence of right artificial knee joint: Secondary | ICD-10-CM | POA: Diagnosis not present

## 2022-06-28 DIAGNOSIS — M25661 Stiffness of right knee, not elsewhere classified: Secondary | ICD-10-CM

## 2022-06-28 DIAGNOSIS — R600 Localized edema: Secondary | ICD-10-CM

## 2022-06-28 NOTE — Therapy (Signed)
OUTPATIENT PHYSICAL THERAPY LOWER EXTREMITY Treatment    Patient Name: Diane Giles MRN: [id number] DOB:06-Mar-1956, 67 y.o., female Today's Date: 06/28/2022  END OF SESSION:  PT End of Session - 06/28/22 1537     Visit Number 5    Number of Visits 16    Date for PT Re-Evaluation 08/09/22    PT Start Time 1515    PT Stop Time 1555    PT Time Calculation (min) 40 min    Activity Tolerance Patient tolerated treatment well    Behavior During Therapy [facility] for tasks assessed/performed             Past Medical History:  Diagnosis Date   Aortic stenosis    mild-moderate AS 04/22/19 echo   Aortoiliac occlusive disease (HCC) 12/03/2016   Arthritis    Diabetes mellitus without complication (HCC)    per pt she is pre- diabetic   Diverticulitis    Gout    Heart murmur    Hyperlipidemia    Hypertension    Osteoporosis    Sleep apnea    Tobacco use disorder 07/04/2012   Past Surgical History:  Procedure Laterality Date   ABDOMINAL AORTOGRAM W/LOWER EXTREMITY N/A 12/03/2016   Procedure: Abdominal Aortogram w/Lower Extremity;  Surgeon: Chuck Hint, MD;  Location: [facility] INVASIVE CV LAB;  Service: Cardiovascular;  Laterality: N/A;   AORTA -INNOMIATE BYPASS N/A 05/18/2019   Procedure: AORTA -INNOMIATE BYPASS Using Hemashield Gold Graft Size 8mm;  Surgeon: Alleen Borne, MD;  Location: [facility] OR;  Service: Open Heart Surgery;  Laterality: N/A;   aorto- fem bypass Bilateral    aortobiiliac bypass in 2000   DILATATION & CURETTAGE/HYSTEROSCOPY WITH MYOSURE N/A 12/27/2015   Procedure: DILATATION & CURETTAGE/HYSTEROSCOPY;  Surgeon: Patton Salles, MD;  Location: [facility] ORS;  Service: Gynecology;  Laterality: N/A;   DILATION AND CURETTAGE OF UTERUS  11/2015   ELBOW SURGERY Left ~2007   tendon repair by Dr. Ranell Patrick   EYE SURGERY Bilateral 2008   lasik   JOINT REPLACEMENT     lower aortic bypass  2000   per patient   PERIPHERAL VASCULAR INTERVENTION  12/03/2016    Stenting of anastomotic stenosis of the aortoiliac and right common iliac artery with 7 x 39 mm VBX covered stent and postdilated with 9 mm balloon.   Right innonimate bypass  05/18/2019   Aorto innominate bypass surgery with median sternotomy   STERNOTOMY  05/18/2019   Right aorto-innonimate bypass surgery 05/18/19 Dr. Laneta Simmers   TOTAL HIP ARTHROPLASTY Right 07/27/2014   Procedure: RIGHT TOTAL HIP ARTHROPLASTY ANTERIOR APPROACH;  Surgeon: Durene Romans, MD;  Location: [facility] ORS;  Service: Orthopedics;  Laterality: Right;   TOTAL KNEE ARTHROPLASTY Right 05/28/2022   Procedure: RIGHT TOTAL KNEE ARTHROPLASTY;  Surgeon: Tarry Kos, MD;  Location: [facility] OR;  Service: Orthopedics;  Laterality: Right;   Patient Active Problem List   Diagnosis Date Noted   Status post total right knee replacement 05/28/2022   Vitamin B12 deficiency 10/31/2021   Small bowel obstruction (HCC)    Partial small bowel obstruction (HCC) 10/28/2021   Primary osteoarthritis of right knee 06/20/2021   PAD (peripheral artery disease) (HCC)    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   Aortoiliac occlusive disease (HCC) 12/03/2016   Centrilobular emphysema (HCC) 10/03/2016   Right groin pain 02/27/2016   Sacroiliitis (HCC) 02/27/2016   Postmenopausal bleeding 12/01/2015   Psoas tendinitis of right side 10/21/2015   Endometrial thickening on ultra sound  07/28/2015   S/P right THA, 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   Type 2 diabetes mellitus (HCC) 05/18/2014   DDD (degenerative disc disease) 03/18/2013   Metabolic syndrome 07/29/2012   Essential hypertension, benign 07/18/2012   Hyperlipidemia 07/18/2012   Obstructive sleep apnea 07/04/2012   Osteoporosis 07/04/2012   Morbid obesity (HCC) 07/04/2012    PCP: Lula Olszewski MD   REFERRING PROVIDER: Neomia Dear PA   REFERRING DIAG: Right TKA  THERAPY DIAG:  Acute pain of right knee  Stiffness of right knee, not elsewhere classified  Other abnormalities of gait and  mobility  Localized edema  Rationale for Evaluation and Treatment: Rehabilitation  ONSET DATE: 05/28/2022 Days since surgery: 31   SUBJECTIVE:   SUBJECTIVE STATEMENT: The patient did well after the last visit. Overall she feels like she is doing well. She is a little sore. She is having some trouble with her sitting knee stretch.   PERTINENT HISTORY: Aortic stenosis; Pre-diabetic,  Gout ( hasn't had an attack in 1.5 years) Osteopetrosis,  right hip replacement; Left ebow surgery 2007;  PAIN:  Are you having pain? Yes: NPRS scale: 0/10 pain at this time has reached an 8/10  Pain location: right knee  Pain description: aching  Aggravating factors: night time;  Relieving factors: rest   PRECAUTIONS: None  WEIGHT BEARING RESTRICTIONS: No  FALLS:  Has patient fallen in last 6 months? No  LIVING ENVIRONMENT: 15  going to the second floor. 2 steps going in.   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     PLOF: Independent  PATIENT GOALS:   To get back to exercises   NEXT MD VISIT:   OBJECTIVE:   DIAGNOSTIC FINDINGS:   PATIENT SURVEYS:  FOTO    COGNITION: Overall cognitive status: Within functional limits for tasks assessed     SENSATION: WFL  EDEMA:  Circumferential: 43.5  MUSCLE LENGTH:  POSTURE: No Significant postural limitations  PALPATION: No unexpected tenderness to palpation   LOWER EXTREMITY ROM:  Passive ROM Right eval Left eval R 2/16 2/19  Hip flexion      Hip extension      Hip abduction      Hip adduction      Hip internal rotation      Hip external rotation      Knee flexion 95 100 102 110  Knee extension -5 -6 -5 -3  Ankle dorsiflexion      Ankle plantarflexion      Ankle inversion      Ankle eversion       (Blank rows = not tested)  LOWER EXTREMITY MMT:  MMT Right eval Left eval  Hip flexion 14.6 24.8  Hip extension    Hip abduction 23.5 32.8  Hip adduction    Hip internal rotation    Hip external  rotation    Knee flexion    Knee extension  27.2  Ankle dorsiflexion    Ankle plantarflexion    Ankle inversion    Ankle eversion     (Blank rows = not tested)    FUNCTIONAL TESTS:  Mild use of hands for sit to stand    GAIT: Using single point cane; decreased single leg stance time on the right compared to the left.   TODAY'S TREATMENT:  DATE:  2/22 PROM flexion and ext to tolerance Knee ext mob grade II-III on R Quad set 2x15  SLR 3x10   SAQ 3x15 2 lbs   Step up 4 inch 2x10  LAQ 3x10 2x10 2lbs  Side step 2x10 2lbs   2/19 PROM flexion and ext to tolerance Knee ext mob grade II-III on R Quad set 2x15  SLR 3x10   SAQ 3x15 1.5 lbs   Step up 4 inch 2x10 reviewed where she can do in the gym  LAQ 3x10  Reviewed self knee stretch. Patient doing correctly. She was having pain but she also gained 8 degrees of motion.      PATIENT EDUCATION:  Education details: HEP, symptom management  Person educated: Patient Education method: Explanation, Demonstration, Tactile cues, Verbal cues, and Handouts Education comprehension: verbalized understanding, returned demonstration, verbal cues required, tactile cues required, and needs further education  HOME EXERCISE PROGRAM: Access Code: [email or web address] URL: [email or web address] Date: 06/13/2022 Prepared by: Lorayne Bender  Exercises - Supine Knee Extension Stretch on Towel Roll  - 2 x daily - 7 x weekly - 3 sets - 5 reps - 10 sec hold hold - Supine Heel Slide with Strap  - 2 x daily - 7 x weekly - 3 sets - 10 reps - 5 sec  hold - Supine Quad Set  - 2 x daily - 7 x weekly - 3 sets - 10 reps - Active Straight Leg Raise with Quad Set  - 1 x daily - 7 x weekly - 3 sets - 10 reps  ASSESSMENT:  CLINICAL IMPRESSION: We continue to progress her steps. Her extension is improving. Overall she is  making great progress. We will continue to progress ROM and functional strengthening as tolerated.    OBJECTIVE IMPAIRMENTS: Abnormal gait, decreased activity tolerance, decreased mobility, difficulty walking, decreased ROM, decreased strength, increased edema, and pain.   ACTIVITY LIMITATIONS: carrying, lifting, bending, sitting, standing, squatting, sleeping, stairs, transfers, and locomotion level  PARTICIPATION LIMITATIONS: meal prep, cleaning, laundry, driving, shopping, community activity, occupation, and yard work  PERSONAL FACTORS: 1-2 comorbidities: right hip replacement  are also affecting patient's functional outcome.   REHAB POTENTIAL: Excellent  CLINICAL DECISION MAKING: Stable/uncomplicated  EVALUATION COMPLEXITY: Low   GOALS: Goals reviewed with patient? Yes  SHORT TERM GOALS: Target date: 07/12/2022   Patient will increase right knee flexion to 110 degrees Baseline: Goal status: INITIAL  2.  Patient will demonstrate full passive right knee extension Baseline:  Goal status: INITIAL  3.  Patient will be independent with ambulation of a distance of 500 feet without a single-point cane Baseline:  Goal status: INITIAL  4.  Patient will be independent with basic exercise program Baseline:  Goal status: INITIAL  LONG TERM GOALS: Target date: 07/26/2022    Patient will go up and down 8 steps with reciprocal gait without pain Baseline:  Goal status: INITIAL  2.  Patient will demonstrate deep squat without pain in order to pick up items off the ground Baseline:  Goal status: INITIAL  3.  Patient will demonstrate full range of motion in order to perform ADLs without increased pain Baseline:  Goal status: INITIAL  4.  Patient will be independent with full appropriate gym program Baseline:  Goal status: INITIAL  PLAN:  PT FREQUENCY: 2x/week  PT DURATION: 8 weeks  PLANNED INTERVENTIONS: Therapeutic exercises, Therapeutic activity, Neuromuscular  re-education, Balance training, Gait training, Patient/Family education, Self Care, Joint mobilization, Stair training, DME instructions, Aquatic Therapy, Cryotherapy, Moist heat,  Taping, and Manual therapy  PLAN FOR NEXT SESSION: Passive range of motion for flexion and emphasis on extension.  Consider NuStep, start balance training,  begin stair training and squat training when able.   Lorayne Bender PT DPT  06/28/22 3:44 PM

## 2022-07-02 ENCOUNTER — Ambulatory Visit (HOSPITAL_BASED_OUTPATIENT_CLINIC_OR_DEPARTMENT_OTHER): Payer: Medicare Other | Admitting: Physical Therapy

## 2022-07-02 ENCOUNTER — Encounter (HOSPITAL_BASED_OUTPATIENT_CLINIC_OR_DEPARTMENT_OTHER): Payer: Self-pay | Admitting: Physical Therapy

## 2022-07-02 DIAGNOSIS — Z96651 Presence of right artificial knee joint: Secondary | ICD-10-CM | POA: Diagnosis not present

## 2022-07-02 DIAGNOSIS — M25561 Pain in right knee: Secondary | ICD-10-CM

## 2022-07-02 DIAGNOSIS — M25661 Stiffness of right knee, not elsewhere classified: Secondary | ICD-10-CM

## 2022-07-02 DIAGNOSIS — R2689 Other abnormalities of gait and mobility: Secondary | ICD-10-CM

## 2022-07-02 NOTE — Therapy (Signed)
OUTPATIENT PHYSICAL THERAPY LOWER EXTREMITY Treatment    Patient Name: Diane Giles MRN: [id number] DOB:[birth year], 67 y.o., female Today's Date: 07/02/2022  END OF SESSION:  PT End of Session - 07/02/22 0957     Visit Number 6    Number of Visits 16    Date for PT Re-Evaluation 08/09/22    PT Start Time 0932    PT Stop Time 1013    PT Time Calculation (min) 41 min    Activity Tolerance Patient tolerated treatment well    Behavior During Therapy [facility] for tasks assessed/performed              Past Medical History:  Diagnosis Date   Aortic stenosis    mild-moderate AS 04/22/19 echo   Aortoiliac occlusive disease (HCC) 12/03/2016   Arthritis    Diabetes mellitus without complication (HCC)    per pt she is pre- diabetic   Diverticulitis    Gout    Heart murmur    Hyperlipidemia    Hypertension    Osteoporosis    Sleep apnea    Tobacco use disorder 07/04/2012   Past Surgical History:  Procedure Laterality Date   ABDOMINAL AORTOGRAM W/LOWER EXTREMITY N/A 12/03/2016   Procedure: Abdominal Aortogram w/Lower Extremity;  Surgeon: Chuck Hint, MD;  Location: [facility] INVASIVE CV LAB;  Service: Cardiovascular;  Laterality: N/A;   AORTA -INNOMIATE BYPASS N/A 05/18/2019   Procedure: AORTA -INNOMIATE BYPASS Using Hemashield Gold Graft Size 8mm;  Surgeon: Alleen Borne, MD;  Location: [facility] OR;  Service: Open Heart Surgery;  Laterality: N/A;   aorto- fem bypass Bilateral    aortobiiliac bypass in 2000   DILATATION & CURETTAGE/HYSTEROSCOPY WITH MYOSURE N/A 12/27/2015   Procedure: DILATATION & CURETTAGE/HYSTEROSCOPY;  Surgeon: Patton Salles, MD;  Location: [facility];  Service: Gynecology;  Laterality: N/A;   DILATION AND CURETTAGE OF UTERUS  11/2015   ELBOW SURGERY Left ~2007   tendon repair by Dr. Ranell Patrick   EYE SURGERY Bilateral 2008   lasik   JOINT REPLACEMENT     lower aortic bypass  2000   per patient   PERIPHERAL VASCULAR INTERVENTION  12/03/2016    Stenting of anastomotic stenosis of the aortoiliac and right common iliac artery with 7 x 39 mm VBX covered stent and postdilated with 9 mm balloon.   Right innonimate bypass  05/18/2019   Aorto innominate bypass surgery with median sternotomy   STERNOTOMY  05/18/2019   Right aorto-innonimate bypass surgery 05/18/19 Dr. Laneta Simmers   TOTAL HIP ARTHROPLASTY Right 07/27/2014   Procedure: RIGHT TOTAL HIP ARTHROPLASTY ANTERIOR APPROACH;  Surgeon: Durene Romans, MD;  Location: [facility];  Service: Orthopedics;  Laterality: Right;   TOTAL KNEE ARTHROPLASTY Right 05/28/2022   Procedure: RIGHT TOTAL KNEE ARTHROPLASTY;  Surgeon: Tarry Kos, MD;  Location: [facility] OR;  Service: Orthopedics;  Laterality: Right;   Patient Active Problem List   Diagnosis Date Noted   Status post total right knee replacement 05/28/2022   Vitamin B12 deficiency 10/31/2021   Small bowel obstruction (HCC)    Partial small bowel obstruction (HCC) 10/28/2021   Primary osteoarthritis of right knee 06/20/2021   PAD (peripheral artery disease) (HCC)    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   Aortoiliac occlusive disease (HCC) 12/03/2016   Centrilobular emphysema (HCC) 10/03/2016   Right groin pain 02/27/2016   Sacroiliitis (HCC) 02/27/2016   Postmenopausal bleeding 12/01/2015   Psoas tendinitis of right side 10/21/2015   Endometrial thickening on ultra  sound 07/28/2015   S/P right THA, 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   Type 2 diabetes mellitus (HCC) 05/18/2014   DDD (degenerative disc disease) 03/18/2013   Metabolic syndrome 07/29/2012   Essential hypertension, benign 07/18/2012   Hyperlipidemia 07/18/2012   Obstructive sleep apnea 07/04/2012   Osteoporosis 07/04/2012   Morbid obesity (HCC) 07/04/2012    PCP: Lula Olszewski MD   REFERRING PROVIDER: Neomia Dear PA   REFERRING DIAG: Right TKA  THERAPY DIAG:  Acute pain of right knee  Stiffness of right knee, not elsewhere classified  Other abnormalities of gait and  mobility  Rationale for Evaluation and Treatment: Rehabilitation  ONSET DATE: 05/28/2022 Days since surgery: 35   SUBJECTIVE:   SUBJECTIVE STATEMENT: Pt states that the knee is sore today. She had a big dinner to prep for and standing for 30 min at at time makes the knee ache.   PERTINENT HISTORY: Aortic stenosis; Pre-diabetic,  Gout ( hasn't had an attack in 1.5 years) Osteopetrosis,  right hip replacement; Left ebow surgery 2007;  PAIN:  Are you having pain? Yes: NPRS scale: 3/10 pain at this time has reached an 8/10  Pain location: right knee  Pain description: aching  Aggravating factors: night time;  Relieving factors: rest   PRECAUTIONS: None  WEIGHT BEARING RESTRICTIONS: No  FALLS:  Has patient fallen in last 6 months? No  LIVING ENVIRONMENT: 15  going to the second floor. 2 steps going in.   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     PLOF: Independent  PATIENT GOALS:   To get back to exercises   NEXT MD VISIT:   OBJECTIVE:   DIAGNOSTIC FINDINGS:   PATIENT SURVEYS:  FOTO    COGNITION: Overall cognitive status: Within functional limits for tasks assessed     SENSATION: WFL  EDEMA:  Circumferential: 43.5  MUSCLE LENGTH:  POSTURE: No Significant postural limitations  PALPATION: No unexpected tenderness to palpation   LOWER EXTREMITY ROM:  Passive ROM Right eval Left eval R 2/16 2/19 2/26  Hip flexion       Hip extension       Hip abduction       Hip adduction       Hip internal rotation       Hip external rotation       Knee flexion 95 100 102 110 110  Knee extension -5 -6 -5 -3 -3  Ankle dorsiflexion       Ankle plantarflexion       Ankle inversion       Ankle eversion        (Blank rows = not tested)  LOWER EXTREMITY MMT:  MMT Right eval Left eval  Hip flexion 14.6 24.8  Hip extension    Hip abduction 23.5 32.8  Hip adduction    Hip internal rotation    Hip external rotation    Knee flexion    Knee  extension  27.2  Ankle dorsiflexion    Ankle plantarflexion    Ankle inversion    Ankle eversion     (Blank rows = not tested)    FUNCTIONAL TESTS:  Mild use of hands for sit to stand    GAIT: Using single point cane; decreased single leg stance time on the right compared to the left.   TODAY'S TREATMENT:   2/22 PROM flexion and ext to tolerance  Knee ext mob grade II-III on R Creep extension stretch 2 min; heel on foam roller prop Standing gastroc stretch 30s  3x HR/TR 2x20  SLR 3x8 1lb weight  Sidestep at wall 52ft 3x laps SLS 20s 3x with finger touch                                                                                                                                DATE:  2/22 PROM flexion and ext to tolerance Knee ext mob grade II-III on R Quad set 2x15  SLR 3x10   SAQ 3x15 2 lbs   Step up 4 inch 2x10  LAQ 3x10 2x10 2lbs  Side step 2x10 2lbs   2/19 PROM flexion and ext to tolerance Knee ext mob grade II-III on R Quad set 2x15  SLR 3x10   SAQ 3x15 1.5 lbs   Step up 4 inch 2x10 reviewed where she can do in the gym  LAQ 3x10  Reviewed self knee stretch. Patient doing correctly. She was having pain but she also gained 8 degrees of motion.      PATIENT EDUCATION:  Education details: HEP, symptom management  Person educated: Patient Education method: Explanation, Demonstration, Tactile cues, Verbal cues, and Handouts Education comprehension: verbalized understanding, returned demonstration, verbal cues required, tactile cues required, and needs further education  HOME EXERCISE PROGRAM: Access Code: [email or web address] URL: [email or web address] Date: 06/13/2022 Prepared by: Lorayne Bender  Exercises - Supine Knee Extension Stretch on Towel Roll  - 2 x daily - 7 x weekly - 3 sets - 5 reps - 10 sec hold hold - Supine Heel Slide with Strap  - 2 x daily - 7 x weekly - 3 sets - 10 reps - 5 sec  hold - Supine Quad Set  - 2 x daily - 7 x  weekly - 3 sets - 10 reps - Active Straight Leg Raise with Quad Set  - 1 x daily - 7 x weekly - 3 sets - 10 reps  ASSESSMENT:  CLINICAL IMPRESSION: W patient able to progress range of motion and strength at today's session- with increase in close chain exercise, balance and stretching.  Patient had report of decreased throbbing and aching in the knee following session.  No increase in pain during session.  Home exercise program updated today to include low load long duration stretch for improving TKE.  Patient will benefit from continued skilled therapy in order to address functional mobility deficits return to ADL, household duties, and exercise.    OBJECTIVE IMPAIRMENTS: Abnormal gait, decreased activity tolerance, decreased mobility, difficulty walking, decreased ROM, decreased strength, increased edema, and pain.   ACTIVITY LIMITATIONS: carrying, lifting, bending, sitting, standing, squatting, sleeping, stairs, transfers, and locomotion level  PARTICIPATION LIMITATIONS: meal prep, cleaning, laundry, driving, shopping, community activity, occupation, and yard work  PERSONAL FACTORS: 1-2 comorbidities: right hip replacement  are also affecting patient's functional outcome.   REHAB POTENTIAL: Excellent  CLINICAL DECISION MAKING: Stable/uncomplicated  EVALUATION COMPLEXITY: Low   GOALS: Goals reviewed with patient? Yes  SHORT TERM GOALS: Target date: 07/12/2022  Patient will increase right knee flexion to 110 degrees Baseline: Goal status: INITIAL  2.  Patient will demonstrate full passive right knee extension Baseline:  Goal status: INITIAL  3.  Patient will be independent with ambulation of a distance of 500 feet without a single-point cane Baseline:  Goal status: INITIAL  4.  Patient will be independent with basic exercise program Baseline:  Goal status: INITIAL  LONG TERM GOALS: Target date: 07/26/2022    Patient will go up and down 8 steps with reciprocal gait  without pain Baseline:  Goal status: INITIAL  2.  Patient will demonstrate deep squat without pain in order to pick up items off the ground Baseline:  Goal status: INITIAL  3.  Patient will demonstrate full range of motion in order to perform ADLs without increased pain Baseline:  Goal status: INITIAL  4.  Patient will be independent with full appropriate gym program Baseline:  Goal status: INITIAL  PLAN:  PT FREQUENCY: 2x/week  PT DURATION: 8 weeks  PLANNED INTERVENTIONS: Therapeutic exercises, Therapeutic activity, Neuromuscular re-education, Balance training, Gait training, Patient/Family education, Self Care, Joint mobilization, Stair training, DME instructions, Aquatic Therapy, Cryotherapy, Moist heat, Taping, and Manual therapy  PLAN FOR NEXT SESSION: Passive range of motion for flexion and emphasis on extension.  Consider NuStep, start balance training,  begin stair training and squat training when able.   Lorayne Bender PT DPT  07/02/22 10:16 AM

## 2022-07-05 ENCOUNTER — Ambulatory Visit (HOSPITAL_BASED_OUTPATIENT_CLINIC_OR_DEPARTMENT_OTHER): Payer: Medicare Other

## 2022-07-05 ENCOUNTER — Encounter (HOSPITAL_BASED_OUTPATIENT_CLINIC_OR_DEPARTMENT_OTHER): Payer: Self-pay

## 2022-07-05 DIAGNOSIS — R2689 Other abnormalities of gait and mobility: Secondary | ICD-10-CM

## 2022-07-05 DIAGNOSIS — Z96651 Presence of right artificial knee joint: Secondary | ICD-10-CM | POA: Diagnosis not present

## 2022-07-05 DIAGNOSIS — M25561 Pain in right knee: Secondary | ICD-10-CM

## 2022-07-05 DIAGNOSIS — M25661 Stiffness of right knee, not elsewhere classified: Secondary | ICD-10-CM

## 2022-07-05 DIAGNOSIS — R600 Localized edema: Secondary | ICD-10-CM

## 2022-07-05 NOTE — Therapy (Signed)
OUTPATIENT PHYSICAL THERAPY LOWER EXTREMITY Treatment    Patient Name: Diane Giles MRN: [id number] DOB:12-18-1955, 67 y.o., female Today's Date: 07/05/2022  END OF SESSION:  PT End of Session - 07/05/22 1600     Visit Number 7    Number of Visits 16    Date for PT Re-Evaluation 08/09/22    PT Start Time 1515    PT Stop Time 1600    PT Time Calculation (min) 45 min    Activity Tolerance Patient tolerated treatment well    Behavior During Therapy [facility] for tasks assessed/performed              Past Medical History:  Diagnosis Date   Aortic stenosis    mild-moderate AS 04/22/19 echo   Aortoiliac occlusive disease (HCC) 12/03/2016   Arthritis    Diabetes mellitus without complication (HCC)    per pt she is pre- diabetic   Diverticulitis    Gout    Heart murmur    Hyperlipidemia    Hypertension    Osteoporosis    Sleep apnea    Tobacco use disorder 07/04/2012   Past Surgical History:  Procedure Laterality Date   ABDOMINAL AORTOGRAM W/LOWER EXTREMITY N/A 12/03/2016   Procedure: Abdominal Aortogram w/Lower Extremity;  Surgeon: Chuck Hint, MD;  Location: [facility] INVASIVE CV LAB;  Service: Cardiovascular;  Laterality: N/A;   AORTA -INNOMIATE BYPASS N/A 05/18/2019   Procedure: AORTA -INNOMIATE BYPASS Using Hemashield Gold Graft Size 8mm;  Surgeon: Alleen Borne, MD;  Location: [facility] OR;  Service: Open Heart Surgery;  Laterality: N/A;   aorto- fem bypass Bilateral    aortobiiliac bypass in 2000   DILATATION & CURETTAGE/HYSTEROSCOPY WITH MYOSURE N/A 12/27/2015   Procedure: DILATATION & CURETTAGE/HYSTEROSCOPY;  Surgeon: Patton Salles, MD;  Location: [facility] ORS;  Service: Gynecology;  Laterality: N/A;   DILATION AND CURETTAGE OF UTERUS  11/2015   ELBOW SURGERY Left ~2007   tendon repair by Dr. Ranell Patrick   EYE SURGERY Bilateral 2008   lasik   JOINT REPLACEMENT     lower aortic bypass  2000   per patient   PERIPHERAL VASCULAR INTERVENTION  12/03/2016    Stenting of anastomotic stenosis of the aortoiliac and right common iliac artery with 7 x 39 mm VBX covered stent and postdilated with 9 mm balloon.   Right innonimate bypass  05/18/2019   Aorto innominate bypass surgery with median sternotomy   STERNOTOMY  05/18/2019   Right aorto-innonimate bypass surgery 05/18/19 Dr. Laneta Simmers   TOTAL HIP ARTHROPLASTY Right 07/27/2014   Procedure: RIGHT TOTAL HIP ARTHROPLASTY ANTERIOR APPROACH;  Surgeon: Durene Romans, MD;  Location: [facility] ORS;  Service: Orthopedics;  Laterality: Right;   TOTAL KNEE ARTHROPLASTY Right 05/28/2022   Procedure: RIGHT TOTAL KNEE ARTHROPLASTY;  Surgeon: Tarry Kos, MD;  Location: [facility] OR;  Service: Orthopedics;  Laterality: Right;   Patient Active Problem List   Diagnosis Date Noted   Status post total right knee replacement 05/28/2022   Vitamin B12 deficiency 10/31/2021   Small bowel obstruction (HCC)    Partial small bowel obstruction (HCC) 10/28/2021   Primary osteoarthritis of right knee 06/20/2021   PAD (peripheral artery disease) (HCC)    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   Aortoiliac occlusive disease (HCC) 12/03/2016   Centrilobular emphysema (HCC) 10/03/2016   Right groin pain 02/27/2016   Sacroiliitis (HCC) 02/27/2016   Postmenopausal bleeding 12/01/2015   Psoas tendinitis of right side 10/21/2015   Endometrial thickening on ultra  sound 07/28/2015   S/P right THA, 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   Type 2 diabetes mellitus (HCC) 05/18/2014   DDD (degenerative disc disease) 03/18/2013   Metabolic syndrome 07/29/2012   Essential hypertension, benign 07/18/2012   Hyperlipidemia 07/18/2012   Obstructive sleep apnea 07/04/2012   Osteoporosis 07/04/2012   Morbid obesity (HCC) 07/04/2012    PCP: Lula Olszewski MD   REFERRING PROVIDER: Neomia Dear PA   REFERRING DIAG: Right TKA  THERAPY DIAG:  Stiffness of right knee, not elsewhere classified  Acute pain of right knee  Other abnormalities of gait and  mobility  Localized edema  Rationale for Evaluation and Treatment: Rehabilitation  ONSET DATE: 05/28/2022 Days since surgery: 38   SUBJECTIVE:   SUBJECTIVE STATEMENT: Pt reports 2/10 pain level at entry. Overall doing well. "I feel like I'm behind where I should be." Pt attends [employer] daily and tried to do the recumbent bike which caused some pain.  PERTINENT HISTORY: Aortic stenosis; Pre-diabetic,  Gout ( hasn't had an attack in 1.5 years) Osteopetrosis,  right hip replacement; Left ebow surgery 2007;  PAIN:  Are you having pain? Yes: NPRS scale: 3/10 pain at this time has reached an 8/10  Pain location: right knee  Pain description: aching  Aggravating factors: night time;  Relieving factors: rest   PRECAUTIONS: None  WEIGHT BEARING RESTRICTIONS: No  FALLS:  Has patient fallen in last 6 months? No  LIVING ENVIRONMENT: 15  going to the second floor. 2 steps going in.   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     PLOF: Independent  PATIENT GOALS:   To get back to exercises   NEXT MD VISIT:   OBJECTIVE:   DIAGNOSTIC FINDINGS:   PATIENT SURVEYS:  FOTO    COGNITION: Overall cognitive status: Within functional limits for tasks assessed     SENSATION: WFL  EDEMA:  Circumferential: 43.5  MUSCLE LENGTH:  POSTURE: No Significant postural limitations  PALPATION: No unexpected tenderness to palpation   LOWER EXTREMITY ROM:  Passive ROM Right eval Left eval R 2/16 2/19 2/26  Hip flexion       Hip extension       Hip abduction       Hip adduction       Hip internal rotation       Hip external rotation       Knee flexion 95 100 102 110 110  Knee extension -5 -6 -5 -3 -3  Ankle dorsiflexion       Ankle plantarflexion       Ankle inversion       Ankle eversion        (Blank rows = not tested)  LOWER EXTREMITY MMT:  MMT Right eval Left eval  Hip flexion 14.6 24.8  Hip extension    Hip abduction 23.5 32.8  Hip adduction    Hip  internal rotation    Hip external rotation    Knee flexion    Knee extension  27.2  Ankle dorsiflexion    Ankle plantarflexion    Ankle inversion    Ankle eversion     (Blank rows = not tested)    FUNCTIONAL TESTS:  Mild use of hands for sit to stand    GAIT: Using single point cane; decreased single leg stance time on the right compared to the left.   TODAY'S TREATMENT:   2/29 PROM flexion and ext to tolerance IASTM to quad using roller Patella mobilizations  Recumbent bike- rocking/full x5 min  SAQ 3# 2x10 3 sec hold  HR/TR 2x20  Standing hip abduction 3x10ea -Standing marching 2x10ea -Step ups 4" 2x10R  Gait- 150'  2/22 PROM flexion and ext to tolerance  Knee ext mob grade II-III on R Creep extension stretch 2 min; heel on foam roller prop Standing gastroc stretch 30s 3x HR/TR 2x20  SLR 3x8 1lb weight  Sidestep at wall 79ft 3x laps SLS 20s 3x with finger touch                                                                                                                                DATE:  2/22 PROM flexion and ext to tolerance Knee ext mob grade II-III on R Quad set 2x15  SLR 3x10   SAQ 3x15 2 lbs   Step up 4 inch 2x10  LAQ 3x10 2x10 2lbs  Side step 2x10 2lbs   2/19 PROM flexion and ext to tolerance Knee ext mob grade II-III on R Quad set 2x15  SLR 3x10   SAQ 3x15 1.5 lbs   Step up 4 inch 2x10 reviewed where she can do in the gym  LAQ 3x10  Reviewed self knee stretch. Patient doing correctly. She was having pain but she also gained 8 degrees of motion.      PATIENT EDUCATION:  Education details: HEP, symptom management  Person educated: Patient Education method: Explanation, Demonstration, Tactile cues, Verbal cues, and Handouts Education comprehension: verbalized understanding, returned demonstration, verbal cues required, tactile cues required, and needs further education  HOME EXERCISE PROGRAM: Access Code: [email or web address] URL:  [email or web address] Date: 06/13/2022 Prepared by: Lorayne Bender  Exercises - Supine Knee Extension Stretch on Towel Roll  - 2 x daily - 7 x weekly - 3 sets - 5 reps - 10 sec hold hold - Supine Heel Slide with Strap  - 2 x daily - 7 x weekly - 3 sets - 10 reps - 5 sec  hold - Supine Quad Set  - 2 x daily - 7 x weekly - 3 sets - 10 reps - Active Straight Leg Raise with Quad Set  - 1 x daily - 7 x weekly - 3 sets - 10 reps  ASSESSMENT:  CLINICAL IMPRESSION: Reviewed leg bike today for ROM focus, which pt can perform for short durations at [facility]. She remains tight into quad mm but responded well to IASTM using roller tool, though tender here. She was educated about how to replicate this at home using a rolling pin. Lacking only minimal knee extension visually. She remains limited with end range flexion, but overall on track with protocol expectations. Able to initiate step ups at 4" step without complaint. Observed very mild valgus on R knee with this. Also demonstrates this with marching in place. Instructed pt to avoid leg press, leg extensions, and HSC machines at [facility] for now.   OBJECTIVE IMPAIRMENTS: Abnormal gait, decreased activity tolerance, decreased mobility, difficulty walking, decreased ROM, decreased strength,  increased edema, and pain.   ACTIVITY LIMITATIONS: carrying, lifting, bending, sitting, standing, squatting, sleeping, stairs, transfers, and locomotion level  PARTICIPATION LIMITATIONS: meal prep, cleaning, laundry, driving, shopping, community activity, occupation, and yard work  PERSONAL FACTORS: 1-2 comorbidities: right hip replacement  are also affecting patient's functional outcome.   REHAB POTENTIAL: Excellent  CLINICAL DECISION MAKING: Stable/uncomplicated  EVALUATION COMPLEXITY: Low   GOALS: Goals reviewed with patient? Yes  SHORT TERM GOALS: Target date: 07/12/2022   Patient will increase right knee flexion to 110  degrees Baseline: Goal status: INITIAL  2.  Patient will demonstrate full passive right knee extension Baseline:  Goal status: IN PROGRESS  3.  Patient will be independent with ambulation of a distance of 500 feet without a single-point cane Baseline:  Goal status: INITIAL  4.  Patient will be independent with basic exercise program Baseline:  Goal status: IN PROGRESS  LONG TERM GOALS: Target date: 07/26/2022    Patient will go up and down 8 steps with reciprocal gait without pain Baseline:  Goal status: INITIAL  2.  Patient will demonstrate deep squat without pain in order to pick up items off the ground Baseline:  Goal status: INITIAL  3.  Patient will demonstrate full range of motion in order to perform ADLs without increased pain Baseline:  Goal status: INITIAL  4.  Patient will be independent with full appropriate gym program Baseline:  Goal status: INITIAL  PLAN:  PT FREQUENCY: 2x/week  PT DURATION: 8 weeks  PLANNED INTERVENTIONS: Therapeutic exercises, Therapeutic activity, Neuromuscular re-education, Balance training, Gait training, Patient/Family education, Self Care, Joint mobilization, Stair training, DME instructions, Aquatic Therapy, Cryotherapy, Moist heat, Taping, and Manual therapy  PLAN FOR NEXT SESSION: Passive range of motion for flexion and emphasis on extension.  Consider NuStep, start balance training,  begin stair training and squat training when able.  Riki Altes, PTA  07/05/22 5:04 PM

## 2022-07-10 ENCOUNTER — Ambulatory Visit (INDEPENDENT_AMBULATORY_CARE_PROVIDER_SITE_OTHER): Payer: Medicare Other

## 2022-07-10 ENCOUNTER — Encounter: Payer: Self-pay | Admitting: Orthopaedic Surgery

## 2022-07-10 ENCOUNTER — Ambulatory Visit (INDEPENDENT_AMBULATORY_CARE_PROVIDER_SITE_OTHER): Payer: Medicare Other | Admitting: Orthopaedic Surgery

## 2022-07-10 DIAGNOSIS — Z96651 Presence of right artificial knee joint: Secondary | ICD-10-CM

## 2022-07-10 DIAGNOSIS — M1712 Unilateral primary osteoarthritis, left knee: Secondary | ICD-10-CM | POA: Insufficient documentation

## 2022-07-10 NOTE — Progress Notes (Unsigned)
Office Visit Note   Patient: Diane Giles           Date of Birth: 03-Apr-1956           MRN: [id number] Visit Date: 07/10/2022              Requested by: Diane Olszewski, MD [address] [place],  [place] [address] PCP: Diane Olszewski, MD   Assessment & Plan: Visit Diagnoses:  1. Status post total right knee replacement     Plan: Ms. Diane Giles is now 6 weeks status post right total knee replacement.  I discussed that she is experiencing a little bit of patella maltracking which is likely due to quadriceps weakness.  She has good varus and valgus stability.  Dental prophylaxis reinforced.  Increase activity as tolerated.  Continue physical therapy until she meets her goals.  Recheck in 6 weeks.  She has been to go back to the gym in the meantime.  Follow-Up Instructions: No follow-ups on file.   Orders:  Orders Placed This Encounter  Procedures   XR Knee 1-2 Views Right   No orders of the defined types were placed in this encounter.     Procedures: No procedures performed   Clinical Data: No additional findings.   Subjective: Chief Complaint  Patient presents with   Right Knee - Follow-up    Right total knee arthroplasty 05/28/2022    HPI  Review of Systems   Objective: Vital Signs: LMP 05/07/2005 (Approximate)   Physical Exam  Ortho Exam  Specialty Comments:  No specialty comments available.  Imaging: No results found.   PMFS History: Patient Active Problem List   Diagnosis Date Noted   Primary osteoarthritis of left knee 07/10/2022   Status post total right knee replacement 05/28/2022   Vitamin B12 deficiency 10/31/2021   Small bowel obstruction (HCC)    Partial small bowel obstruction (HCC) 10/28/2021   Primary osteoarthritis of right knee 06/20/2021   PAD (peripheral artery disease) (HCC)    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   Aortoiliac occlusive disease (HCC) 12/03/2016   Centrilobular emphysema (HCC)  10/03/2016   Right groin pain 02/27/2016   Sacroiliitis (HCC) 02/27/2016   Postmenopausal bleeding 12/01/2015   Psoas tendinitis of right side 10/21/2015   Endometrial thickening on ultra sound 07/28/2015   S/P right THA, 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   Type 2 diabetes mellitus (HCC) 05/18/2014   DDD (degenerative disc disease) 03/18/2013   Metabolic syndrome 07/29/2012   Essential hypertension, benign 07/18/2012   Hyperlipidemia 07/18/2012   Obstructive sleep apnea 07/04/2012   Osteoporosis 07/04/2012   Morbid obesity (HCC) 07/04/2012   Past Medical History:  Diagnosis Date   Aortic stenosis    mild-moderate AS 04/22/19 echo   Aortoiliac occlusive disease (HCC) 12/03/2016   Arthritis    Diabetes mellitus without complication (HCC)    per pt she is pre- diabetic   Diverticulitis    Gout    Heart murmur    Hyperlipidemia    Hypertension    Osteoporosis    Sleep apnea    Tobacco use disorder 07/04/2012    Family History  Problem Relation Age of Onset   Migraines Mother    Thyroid disease Mother        hypothyroid   Cancer Father 35       Dec age 95 with pancreatic Ca    Past Surgical History:  Procedure Laterality Date   ABDOMINAL AORTOGRAM W/LOWER EXTREMITY N/A 12/03/2016   Procedure: Abdominal Aortogram  w/Lower Extremity;  Surgeon: Chuck Hint, MD;  Location: [facility] INVASIVE CV LAB;  Service: Cardiovascular;  Laterality: N/A;   AORTA -INNOMIATE BYPASS N/A 05/18/2019   Procedure: AORTA -INNOMIATE BYPASS Using Hemashield Gold Graft Size 8mm;  Surgeon: Alleen Borne, MD;  Location: [facility] OR;  Service: Open Heart Surgery;  Laterality: N/A;   aorto- fem bypass Bilateral    aortobiiliac bypass in 2000   DILATATION & CURETTAGE/HYSTEROSCOPY WITH MYOSURE N/A 12/27/2015   Procedure: DILATATION & CURETTAGE/HYSTEROSCOPY;  Surgeon: Patton Salles, MD;  Location: [facility];  Service: Gynecology;  Laterality: N/A;   DILATION AND CURETTAGE OF UTERUS  11/2015   ELBOW SURGERY Left  ~2007   tendon repair by Dr. Ranell Patrick   EYE SURGERY Bilateral 2008   lasik   JOINT REPLACEMENT     lower aortic bypass  2000   per patient   PERIPHERAL VASCULAR INTERVENTION  12/03/2016   Stenting of anastomotic stenosis of the aortoiliac and right common iliac artery with 7 x 39 mm VBX covered stent and postdilated with 9 mm balloon.   Right innonimate bypass  05/18/2019   Aorto innominate bypass surgery with median sternotomy   STERNOTOMY  05/18/2019   Right aorto-innonimate bypass surgery 05/18/19 Dr. Laneta Simmers   TOTAL HIP ARTHROPLASTY Right 07/27/2014   Procedure: RIGHT TOTAL HIP ARTHROPLASTY ANTERIOR APPROACH;  Surgeon: Durene Romans, MD;  Location: [facility];  Service: Orthopedics;  Laterality: Right;   TOTAL KNEE ARTHROPLASTY Right 05/28/2022   Procedure: RIGHT TOTAL KNEE ARTHROPLASTY;  Surgeon: Tarry Kos, MD;  Location: [facility] OR;  Service: Orthopedics;  Laterality: Right;   Social History   Occupational History   Occupation: Tree surgeon  Tobacco Use   Smoking status: Former    Packs/day: 0.50    Years: 40.00    Total pack years: 20.00    Types: Cigarettes    Quit date: 02/23/2017    Years since quitting: 5.3   Smokeless tobacco: Never  Vaping Use   Vaping Use: Never used  Substance and Sexual Activity   Alcohol use: Yes    Comment: very rare--1/month   Drug use: No   Sexual activity: Yes    Partners: Male    Birth control/protection: Post-menopausal

## 2022-07-11 ENCOUNTER — Ambulatory Visit (HOSPITAL_BASED_OUTPATIENT_CLINIC_OR_DEPARTMENT_OTHER): Payer: Medicare Other | Attending: Orthopaedic Surgery | Admitting: Physical Therapy

## 2022-07-11 ENCOUNTER — Encounter (HOSPITAL_BASED_OUTPATIENT_CLINIC_OR_DEPARTMENT_OTHER): Payer: Self-pay | Admitting: Physical Therapy

## 2022-07-11 DIAGNOSIS — M25661 Stiffness of right knee, not elsewhere classified: Secondary | ICD-10-CM | POA: Insufficient documentation

## 2022-07-11 DIAGNOSIS — R2689 Other abnormalities of gait and mobility: Secondary | ICD-10-CM | POA: Insufficient documentation

## 2022-07-11 DIAGNOSIS — R600 Localized edema: Secondary | ICD-10-CM | POA: Insufficient documentation

## 2022-07-11 DIAGNOSIS — M25561 Pain in right knee: Secondary | ICD-10-CM | POA: Insufficient documentation

## 2022-07-11 NOTE — Therapy (Signed)
OUTPATIENT PHYSICAL THERAPY LOWER EXTREMITY Treatment    Patient Name: Diane Giles MRN: [id number] DOB:Jun 13, 1955, 67 y.o., female Today's Date: 07/11/2022  END OF SESSION:  PT End of Session - 07/11/22 1110     Visit Number 8    Number of Visits 16    Date for PT Re-Evaluation 08/09/22    Authorization Type MCR    PT Start Time 1103    PT Stop Time 1143    PT Time Calculation (min) 40 min    Activity Tolerance Patient tolerated treatment well    Behavior During Therapy [facility] for tasks assessed/performed              Past Medical History:  Diagnosis Date   Aortic stenosis    mild-moderate AS 04/22/19 echo   Aortoiliac occlusive disease (HCC) 12/03/2016   Arthritis    Diabetes mellitus without complication (HCC)    per pt she is pre- diabetic   Diverticulitis    Gout    Heart murmur    Hyperlipidemia    Hypertension    Osteoporosis    Sleep apnea    Tobacco use disorder 07/04/2012   Past Surgical History:  Procedure Laterality Date   ABDOMINAL AORTOGRAM W/LOWER EXTREMITY N/A 12/03/2016   Procedure: Abdominal Aortogram w/Lower Extremity;  Surgeon: Chuck Hint, MD;  Location: [facility] INVASIVE CV LAB;  Service: Cardiovascular;  Laterality: N/A;   AORTA -INNOMIATE BYPASS N/A 05/18/2019   Procedure: AORTA -INNOMIATE BYPASS Using Hemashield Gold Graft Size 8mm;  Surgeon: Alleen Borne, MD;  Location: [facility] OR;  Service: Open Heart Surgery;  Laterality: N/A;   aorto- fem bypass Bilateral    aortobiiliac bypass in 2000   DILATATION & CURETTAGE/HYSTEROSCOPY WITH MYOSURE N/A 12/27/2015   Procedure: DILATATION & CURETTAGE/HYSTEROSCOPY;  Surgeon: Patton Salles, MD;  Location: [facility] ORS;  Service: Gynecology;  Laterality: N/A;   DILATION AND CURETTAGE OF UTERUS  11/2015   ELBOW SURGERY Left ~2007   tendon repair by Dr. Ranell Patrick   EYE SURGERY Bilateral 2008   lasik   JOINT REPLACEMENT     lower aortic bypass  2000   per patient   PERIPHERAL VASCULAR  INTERVENTION  12/03/2016   Stenting of anastomotic stenosis of the aortoiliac and right common iliac artery with 7 x 39 mm VBX covered stent and postdilated with 9 mm balloon.   Right innonimate bypass  05/18/2019   Aorto innominate bypass surgery with median sternotomy   STERNOTOMY  05/18/2019   Right aorto-innonimate bypass surgery 05/18/19 Dr. Laneta Simmers   TOTAL HIP ARTHROPLASTY Right 07/27/2014   Procedure: RIGHT TOTAL HIP ARTHROPLASTY ANTERIOR APPROACH;  Surgeon: Durene Romans, MD;  Location: [facility] ORS;  Service: Orthopedics;  Laterality: Right;   TOTAL KNEE ARTHROPLASTY Right 05/28/2022   Procedure: RIGHT TOTAL KNEE ARTHROPLASTY;  Surgeon: Tarry Kos, MD;  Location: [facility] OR;  Service: Orthopedics;  Laterality: Right;   Patient Active Problem List   Diagnosis Date Noted   Primary osteoarthritis of left knee 07/10/2022   Status post total right knee replacement 05/28/2022   Vitamin B12 deficiency 10/31/2021   Small bowel obstruction (HCC)    Partial small bowel obstruction (HCC) 10/28/2021   Primary osteoarthritis of right knee 06/20/2021   PAD (peripheral artery disease) (HCC)    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   Aortoiliac occlusive disease (HCC) 12/03/2016   Centrilobular emphysema (HCC) 10/03/2016   Right groin pain 02/27/2016   Sacroiliitis (HCC) 02/27/2016   Postmenopausal bleeding 12/01/2015  Psoas tendinitis of right side 10/21/2015   Endometrial thickening on ultra sound 07/28/2015   S/P right THA, 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   Type 2 diabetes mellitus (HCC) 05/18/2014   DDD (degenerative disc disease) 03/18/2013   Metabolic syndrome 07/29/2012   Essential hypertension, benign 07/18/2012   Hyperlipidemia 07/18/2012   Obstructive sleep apnea 07/04/2012   Osteoporosis 07/04/2012   Morbid obesity (HCC) 07/04/2012    PCP: Lula Olszewski MD   REFERRING PROVIDER: Neomia Dear PA   REFERRING DIAG: Right TKA  THERAPY DIAG:  Stiffness of right knee, not  elsewhere classified  Acute pain of right knee  Other abnormalities of gait and mobility  Localized edema  Rationale for Evaluation and Treatment: Rehabilitation  ONSET DATE: 05/28/2022 Days since surgery: 44   SUBJECTIVE:   SUBJECTIVE STATEMENT: Pt states that the knee is improving but is still sore. Saw MD and had a good visit with no specific instructions for rehab.   PERTINENT HISTORY: Aortic stenosis; Pre-diabetic,  Gout ( hasn't had an attack in 1.5 years) Osteopetrosis,  right hip replacement; Left ebow surgery 2007;  PAIN:  Are you having pain? No: NPRS scale: 0/10 Pain location: right knee  Pain description: aching  Aggravating factors: night time;  Relieving factors: rest   PRECAUTIONS: None  WEIGHT BEARING RESTRICTIONS: No  FALLS:  Has patient fallen in last 6 months? No  LIVING ENVIRONMENT: 15  going to the second floor. 2 steps going in.   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     PLOF: Independent  PATIENT GOALS:   To get back to exercises   NEXT MD VISIT:   OBJECTIVE:   DIAGNOSTIC FINDINGS:   PATIENT SURVEYS:  FOTO    COGNITION: Overall cognitive status: Within functional limits for tasks assessed     SENSATION: WFL  EDEMA:  Circumferential: 43.5  MUSCLE LENGTH:  POSTURE: No Significant postural limitations  PALPATION: No unexpected tenderness to palpation   LOWER EXTREMITY ROM:  Passive ROM Right eval Left eval R 2/16 2/19 2/26 3/6  Hip flexion        Hip extension        Hip abduction        Hip adduction        Hip internal rotation        Hip external rotation        Knee flexion 95 100 102 110 110   Knee extension -5 -6 -5 -3 -3 0  Ankle dorsiflexion        Ankle plantarflexion        Ankle inversion        Ankle eversion         (Blank rows = not tested)  LOWER EXTREMITY MMT:  MMT Right eval Left eval  Hip flexion 14.6 24.8  Hip extension    Hip abduction 23.5 32.8  Hip adduction    Hip  internal rotation    Hip external rotation    Knee flexion    Knee extension  27.2  Ankle dorsiflexion    Ankle plantarflexion    Ankle inversion    Ankle eversion     (Blank rows = not tested)    FUNCTIONAL TESTS:  Mild use of hands for sit to stand      TODAY'S TREATMENT:   3/6 Patella mobilizations- grade III in all directions  R knee TKE joint mob grade III  Recumbent bike- rocking/full x5 min  LAQ 3x10 3s  HR/TR 2x20  Standing  TKE RTB 2x10 Standing HS curls 3x10 -Step ups 4" 3x8   2/29 PROM flexion and ext to tolerance IASTM to quad using roller Patella mobilizations  Recumbent bike- rocking/full x5 min  SAQ 3# 2x10 3 sec hold  HR/TR 2x20  Standing hip abduction 3x10ea -Standing marching 2x10ea -Step ups 4" 2x10R   2/22 PROM flexion and ext to tolerance  Knee ext mob grade II-III on R Creep extension stretch 2 min; heel on foam roller prop Standing gastroc stretch 30s 3x HR/TR 2x20  SLR 3x8 1lb weight  Sidestep at wall 99ft 3x laps SLS 20s 3x with finger touch                                                                                                                                DATE:  2/22 PROM flexion and ext to tolerance Knee ext mob grade II-III on R Quad set 2x15  SLR 3x10   SAQ 3x15 2 lbs   Step up 4 inch 2x10  LAQ 3x10 2x10 2lbs  Side step 2x10 2lbs   2/19 PROM flexion and ext to tolerance Knee ext mob grade II-III on R Quad set 2x15  SLR 3x10   SAQ 3x15 1.5 lbs   Step up 4 inch 2x10 reviewed where she can do in the gym  LAQ 3x10  Reviewed self knee stretch. Patient doing correctly. She was having pain but she also gained 8 degrees of motion.      PATIENT EDUCATION:  Education details: HEP, symptom management  Person educated: Patient Education method: Explanation, Demonstration, Tactile cues, Verbal cues, and Handouts Education comprehension: verbalized understanding, returned demonstration, verbal cues  required, tactile cues required, and needs further education  HOME EXERCISE PROGRAM: Access Code: [email or web address] URL: [email or web address] Date: 06/13/2022 Prepared by: Lorayne Bender  Exercises - Supine Knee Extension Stretch on Towel Roll  - 2 x daily - 7 x weekly - 3 sets - 5 reps - 10 sec hold hold - Supine Heel Slide with Strap  - 2 x daily - 7 x weekly - 3 sets - 10 reps - 5 sec  hold - Supine Quad Set  - 2 x daily - 7 x weekly - 3 sets - 10 reps - Active Straight Leg Raise with Quad Set  - 1 x daily - 7 x weekly - 3 sets - 10 reps  ASSESSMENT:  CLINICAL IMPRESSION: Observationally, pt able to reach >110 deg of flexion today with recumbent bike and measures at 0 deg of ext today following joint mobilizations. Pt still sensitive and stiff at the R knee with static positioning. Pt exercises progressed today for greater arc of motion as well as increase in quad loading. HEP progressed and updated today. Pt is progressing well and advised on slow graded progression with exercise. Consider use of compression sleeve with long distance walking to assist with swelling and comfort. Continue with knee range and R LE strength at  future sessions. Pt would benefit from continued skilled therapy in order to reach goals and maximize functional R LE strength and ROM for full return to PLOF.   OBJECTIVE IMPAIRMENTS: Abnormal gait, decreased activity tolerance, decreased mobility, difficulty walking, decreased ROM, decreased strength, increased edema, and pain.   ACTIVITY LIMITATIONS: carrying, lifting, bending, sitting, standing, squatting, sleeping, stairs, transfers, and locomotion level  PARTICIPATION LIMITATIONS: meal prep, cleaning, laundry, driving, shopping, community activity, occupation, and yard work  PERSONAL FACTORS: 1-2 comorbidities: right hip replacement  are also affecting patient's functional outcome.   REHAB POTENTIAL: Excellent  CLINICAL DECISION MAKING:  Stable/uncomplicated  EVALUATION COMPLEXITY: Low   GOALS: Goals reviewed with patient? Yes  SHORT TERM GOALS: Target date: 07/12/2022   Patient will increase right knee flexion to 110 degrees Baseline: Goal status: INITIAL  2.  Patient will demonstrate full passive right knee extension Baseline:  Goal status: IN PROGRESS  3.  Patient will be independent with ambulation of a distance of 500 feet without a single-point cane Baseline:  Goal status: INITIAL  4.  Patient will be independent with basic exercise program Baseline:  Goal status: IN PROGRESS  LONG TERM GOALS: Target date: 07/26/2022    Patient will go up and down 8 steps with reciprocal gait without pain Baseline:  Goal status: INITIAL  2.  Patient will demonstrate deep squat without pain in order to pick up items off the ground Baseline:  Goal status: INITIAL  3.  Patient will demonstrate full range of motion in order to perform ADLs without increased pain Baseline:  Goal status: INITIAL  4.  Patient will be independent with full appropriate gym program Baseline:  Goal status: INITIAL  PLAN:  PT FREQUENCY: 2x/week  PT DURATION: 8 weeks  PLANNED INTERVENTIONS: Therapeutic exercises, Therapeutic activity, Neuromuscular re-education, Balance training, Gait training, Patient/Family education, Self Care, Joint mobilization, Stair training, DME instructions, Aquatic Therapy, Cryotherapy, Moist heat, Taping, and Manual therapy  PLAN FOR NEXT SESSION: Passive range of motion for flexion and emphasis on extension.  Consider NuStep, start balance training,  begin stair training and squat training when able.  Zebedee Iba PT, DPT 07/11/22 11:48 AM

## 2022-07-13 ENCOUNTER — Encounter (HOSPITAL_BASED_OUTPATIENT_CLINIC_OR_DEPARTMENT_OTHER): Payer: Self-pay | Admitting: Physical Therapy

## 2022-07-13 ENCOUNTER — Ambulatory Visit (HOSPITAL_BASED_OUTPATIENT_CLINIC_OR_DEPARTMENT_OTHER): Payer: Medicare Other | Admitting: Physical Therapy

## 2022-07-13 DIAGNOSIS — M25561 Pain in right knee: Secondary | ICD-10-CM

## 2022-07-13 DIAGNOSIS — R600 Localized edema: Secondary | ICD-10-CM

## 2022-07-13 DIAGNOSIS — R2689 Other abnormalities of gait and mobility: Secondary | ICD-10-CM

## 2022-07-13 DIAGNOSIS — M25661 Stiffness of right knee, not elsewhere classified: Secondary | ICD-10-CM | POA: Diagnosis not present

## 2022-07-13 NOTE — Therapy (Signed)
OUTPATIENT PHYSICAL THERAPY LOWER EXTREMITY Treatment / Progress note    Patient Name: Diane Giles MRN: [id number] DOB:[birth year], 67 y.o., female Today's Date: 07/13/2022  END OF SESSION:  PT End of Session - 07/13/22 1105     Visit Number 9    Number of Visits 16    Date for PT Re-Evaluation 08/09/22    Authorization Type MCR Progress note performed on viist 9.    PT Start Time 1100    PT Stop Time 1143    PT Time Calculation (min) 43 min    Activity Tolerance Patient tolerated treatment well    Behavior During Therapy [facility] for tasks assessed/performed            Progress Note Reporting Period 06/13/2022 to 07/13/2022  See note below for Objective Data and Assessment of Progress/Goals.      Past Medical History:  Diagnosis Date   Aortic stenosis    mild-moderate AS 04/22/19 echo   Aortoiliac occlusive disease (HCC) 12/03/2016   Arthritis    Diabetes mellitus without complication (HCC)    per pt she is pre- diabetic   Diverticulitis    Gout    Heart murmur    Hyperlipidemia    Hypertension    Osteoporosis    Sleep apnea    Tobacco use disorder 07/04/2012   Past Surgical History:  Procedure Laterality Date   ABDOMINAL AORTOGRAM W/LOWER EXTREMITY N/A 12/03/2016   Procedure: Abdominal Aortogram w/Lower Extremity;  Surgeon: Chuck Hint, MD;  Location: [facility] INVASIVE CV LAB;  Service: Cardiovascular;  Laterality: N/A;   AORTA -INNOMIATE BYPASS N/A 05/18/2019   Procedure: AORTA -INNOMIATE BYPASS Using Hemashield Gold Graft Size 8mm;  Surgeon: Alleen Borne, MD;  Location: [facility] OR;  Service: Open Heart Surgery;  Laterality: N/A;   aorto- fem bypass Bilateral    aortobiiliac bypass in 2000   DILATATION & CURETTAGE/HYSTEROSCOPY WITH MYOSURE N/A 12/27/2015   Procedure: DILATATION & CURETTAGE/HYSTEROSCOPY;  Surgeon: Patton Salles, MD;  Location: [facility] ORS;  Service: Gynecology;  Laterality: N/A;   DILATION AND CURETTAGE OF UTERUS  11/2015   ELBOW  SURGERY Left ~2007   tendon repair by Dr. Ranell Patrick   EYE SURGERY Bilateral 2008   lasik   JOINT REPLACEMENT     lower aortic bypass  2000   per patient   PERIPHERAL VASCULAR INTERVENTION  12/03/2016   Stenting of anastomotic stenosis of the aortoiliac and right common iliac artery with 7 x 39 mm VBX covered stent and postdilated with 9 mm balloon.   Right innonimate bypass  05/18/2019   Aorto innominate bypass surgery with median sternotomy   STERNOTOMY  05/18/2019   Right aorto-innonimate bypass surgery 05/18/19 Dr. Laneta Simmers   TOTAL HIP ARTHROPLASTY Right 07/27/2014   Procedure: RIGHT TOTAL HIP ARTHROPLASTY ANTERIOR APPROACH;  Surgeon: Durene Romans, MD;  Location: [facility] ORS;  Service: Orthopedics;  Laterality: Right;   TOTAL KNEE ARTHROPLASTY Right 05/28/2022   Procedure: RIGHT TOTAL KNEE ARTHROPLASTY;  Surgeon: Tarry Kos, MD;  Location: [facility] OR;  Service: Orthopedics;  Laterality: Right;   Patient Active Problem List   Diagnosis Date Noted   Primary osteoarthritis of left knee 07/10/2022   Status post total right knee replacement 05/28/2022   Vitamin B12 deficiency 10/31/2021   Small bowel obstruction (HCC)    Partial small bowel obstruction (HCC) 10/28/2021   Primary osteoarthritis of right knee 06/20/2021   PAD (peripheral artery disease) (HCC)    Atherosclerotic stenosis of innominate artery  05/18/2019   Aortoiliac occlusive disease (HCC) 12/03/2016   Centrilobular emphysema (HCC) 10/03/2016   Right groin pain 02/27/2016   Sacroiliitis (HCC) 02/27/2016   Postmenopausal bleeding 12/01/2015   Psoas tendinitis of right side 10/21/2015   Endometrial thickening on ultra sound 07/28/2015   S/P right THA, 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   Type 2 diabetes mellitus (HCC) 05/18/2014   DDD (degenerative disc disease) 03/18/2013   Metabolic syndrome 07/29/2012   Essential hypertension, benign 07/18/2012   Hyperlipidemia 07/18/2012   Obstructive sleep apnea 07/04/2012   Osteoporosis 07/04/2012   Morbid  obesity (HCC) 07/04/2012    PCP: Lula Olszewski MD   REFERRING PROVIDER: Neomia Dear PA   REFERRING DIAG: Right TKA  THERAPY DIAG:  Stiffness of right knee, not elsewhere classified  Acute pain of right knee  Other abnormalities of gait and mobility  Localized edema  Rationale for Evaluation and Treatment: Rehabilitation  ONSET DATE: 05/28/2022 Days since surgery: 46   SUBJECTIVE:   SUBJECTIVE STATEMENT: Patient reports that her knee at times feels unstable. She has been to the gym everyday this week to do something.   PERTINENT HISTORY: Aortic stenosis; Pre-diabetic,  Gout ( hasn't had an attack in 1.5 years) Osteopetrosis,  right hip replacement; Left ebow surgery 2007;  PAIN:  Are you having pain? No: NPRS scale: 1-2/10 Pain location: right knee  Pain description: aching  Aggravating factors: night time;  Relieving factors: rest   PRECAUTIONS: None  WEIGHT BEARING RESTRICTIONS: No  FALLS:  Has patient fallen in last 6 months? No  LIVING ENVIRONMENT: 15  going to the second floor. 2 steps going in.   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     PLOF: Independent  PATIENT GOALS:   To get back to exercises   NEXT MD VISIT:   OBJECTIVE:   DIAGNOSTIC FINDINGS:   PATIENT SURVEYS:  FOTO    COGNITION: Overall cognitive status: Within functional limits for tasks assessed     SENSATION: WFL  EDEMA:  Circumferential: 43.5  MUSCLE LENGTH:  POSTURE: No Significant postural limitations  PALPATION: No unexpected tenderness to palpation   LOWER EXTREMITY ROM:  Passive ROM Right eval Left eval R 2/16 2/19 2/26 3/6  Hip flexion        Hip extension        Hip abduction        Hip adduction        Hip internal rotation        Hip external rotation        Knee flexion 95 100 102 110 110   Knee extension -5 -6 -5 -3 -3 0  Ankle dorsiflexion        Ankle plantarflexion        Ankle inversion        Ankle eversion          (Blank rows = not tested)  LOWER EXTREMITY MMT:  MMT Right eval Left eval    Hip flexion 14.6 24.8 29.5 35.9  Hip extension      Hip abduction 23.5 32.8 33.4 32.6  Hip adduction      Hip internal rotation      Hip external rotation      Knee flexion      Knee extension  27.2 25.0 35.2  Ankle dorsiflexion      Ankle plantarflexion      Ankle inversion      Ankle eversion       (Blank rows = not  tested)    FUNCTIONAL TESTS:  Mild use of hands for sit to stand      TODAY'S TREATMENT:  3/8 Bike: 6 min  LAQ 3x10  SLR 2x15  Mini squats 2x10 reviewed technique with mini squats more pain on the non-surgical knee   Step up 2x10 4 inch  Lateral step up 2x10 4 inch   Heel raise x20   PROM into all planes with focus on flexion     Performed re-assessment on the patient. Reviewed test and measures and goals including strength testing, FOTO, and ROM     3/6 Patella mobilizations- grade III in all directions  R knee TKE joint mob grade III  Recumbent bike- rocking/full x5 min  LAQ 3x10 3s  HR/TR 2x20  Standing TKE RTB 2x10 Standing HS curls 3x10 -Step ups 4" 3x8   2/29 PROM flexion and ext to tolerance IASTM to quad using roller Patella mobilizations  Recumbent bike- rocking/full x5 min  SAQ 3# 2x10 3 sec hold  HR/TR 2x20  Standing hip abduction 3x10ea -Standing marching 2x10ea -Step ups 4" 2x10R   2/22 PROM flexion and ext to tolerance  Knee ext mob grade II-III on R Creep extension stretch 2 min; heel on foam roller prop Standing gastroc stretch 30s 3x HR/TR 2x20  SLR 3x8 1lb weight  Sidestep at wall 62ft 3x laps SLS 20s 3x with finger touch                                                                                                                                DATE:  2/22 PROM flexion and ext to tolerance Knee ext mob grade II-III on R Quad set 2x15  SLR 3x10   SAQ 3x15 2 lbs   Step up 4 inch 2x10  LAQ 3x10 2x10 2lbs  Side  step 2x10 2lbs   2/19 PROM flexion and ext to tolerance Knee ext mob grade II-III on R Quad set 2x15  SLR 3x10   SAQ 3x15 1.5 lbs   Step up 4 inch 2x10 reviewed where she can do in the gym  LAQ 3x10  Reviewed self knee stretch. Patient doing correctly. She was having pain but she also gained 8 degrees of motion.      PATIENT EDUCATION:  Education details: HEP, symptom management  Person educated: Patient Education method: Explanation, Demonstration, Tactile cues, Verbal cues, and Handouts Education comprehension: verbalized understanding, returned demonstration, verbal cues required, tactile cues required, and needs further education  HOME EXERCISE PROGRAM: Access Code: [email or web address] URL: [email or web address] Date: 06/13/2022 Prepared by: Lorayne Bender  Exercises - Supine Knee Extension Stretch on Towel Roll  - 2 x daily - 7 x weekly - 3 sets - 5 reps - 10 sec hold hold - Supine Heel Slide with Strap  - 2 x daily - 7 x weekly - 3 sets - 10 reps - 5 sec  hold - Supine Quad Set  - 2  x daily - 7 x weekly - 3 sets - 10 reps - Active Straight Leg Raise with Quad Set  - 1 x daily - 7 x weekly - 3 sets - 10 reps  ASSESSMENT:  CLINICAL IMPRESSION: Patient is making good progress. We performed re-assessment on her. Her FOTO score is trending as we would hope. Her strength has improved significantly. She is having more difficulty with her left knee. We reviewed squat technique today but she had more difficulty with the contralateral knee. She is progressing as expected with gait. She was advised, if it feels unstable at times she  might have to scale back with her gym visits just a bit for now.  OBJECTIVE IMPAIRMENTS: Abnormal gait, decreased activity tolerance, decreased mobility, difficulty walking, decreased ROM, decreased strength, increased edema, and pain.   ACTIVITY LIMITATIONS: carrying, lifting, bending, sitting, standing, squatting, sleeping, stairs, transfers, and  locomotion level  PARTICIPATION LIMITATIONS: meal prep, cleaning, laundry, driving, shopping, community activity, occupation, and yard work  PERSONAL FACTORS: 1-2 comorbidities: right hip replacement  are also affecting patient's functional outcome.   REHAB POTENTIAL: Excellent  CLINICAL DECISION MAKING: Stable/uncomplicated  EVALUATION COMPLEXITY: Low   GOALS: Goals reviewed with patient? Yes  SHORT TERM GOALS: Target date: 07/12/2022   Patient will increase right knee flexion to 110 degrees Baseline: Goal status: INITIAL  2.  Patient will demonstrate full passive right knee extension Baseline:  Goal status: IN PROGRESS  3.  Patient will be independent with ambulation of a distance of 500 feet without a single-point cane Baseline:  Goal status: INITIAL  4.  Patient will be independent with basic exercise program Baseline:  Goal status: IN PROGRESS  LONG TERM GOALS: Target date: 07/26/2022    Patient will go up and down 8 steps with reciprocal gait without pain Baseline:  Goal status: INITIAL  2.  Patient will demonstrate deep squat without pain in order to pick up items off the ground Baseline:  Goal status: INITIAL  3.  Patient will demonstrate full range of motion in order to perform ADLs without increased pain Baseline:  Goal status: INITIAL  4.  Patient will be independent with full appropriate gym program Baseline:  Goal status: INITIAL  PLAN:  PT FREQUENCY: 2x/week  PT DURATION: 8 weeks  PLANNED INTERVENTIONS: Therapeutic exercises, Therapeutic activity, Neuromuscular re-education, Balance training, Gait training, Patient/Family education, Self Care, Joint mobilization, Stair training, DME instructions, Aquatic Therapy, Cryotherapy, Moist heat, Taping, and Manual therapy  PLAN FOR NEXT SESSION: Passive range of motion for flexion and emphasis on extension.  Consider NuStep, start balance training,  begin stair training and squat training when  able.  Lorayne Bender PT DPT  07/13/22 4:15 PM

## 2022-07-18 ENCOUNTER — Encounter (HOSPITAL_BASED_OUTPATIENT_CLINIC_OR_DEPARTMENT_OTHER): Payer: Self-pay | Admitting: Physical Therapy

## 2022-07-18 ENCOUNTER — Ambulatory Visit (HOSPITAL_BASED_OUTPATIENT_CLINIC_OR_DEPARTMENT_OTHER): Payer: Medicare Other | Admitting: Physical Therapy

## 2022-07-18 DIAGNOSIS — R2689 Other abnormalities of gait and mobility: Secondary | ICD-10-CM

## 2022-07-18 DIAGNOSIS — M25661 Stiffness of right knee, not elsewhere classified: Secondary | ICD-10-CM

## 2022-07-18 DIAGNOSIS — M25561 Pain in right knee: Secondary | ICD-10-CM

## 2022-07-18 DIAGNOSIS — R600 Localized edema: Secondary | ICD-10-CM

## 2022-07-18 NOTE — Therapy (Signed)
OUTPATIENT PHYSICAL THERAPY LOWER EXTREMITY Treatment / Progress note    Patient Name: Diane Giles MRN: [id number] DOB:[birth year], 67 y.o., female Today's Date: 07/18/2022  END OF SESSION:  PT End of Session - 07/18/22 1103     Visit Number 10    Number of Visits 16    Date for PT Re-Evaluation 08/09/22    Authorization Type MCR Progress note performed on viist 9.    PT Start Time 1100    PT Stop Time 1139    PT Time Calculation (min) 39 min    Activity Tolerance Patient tolerated treatment well    Behavior During Therapy [facility] for tasks assessed/performed             Progress Note Reporting Period 06/13/2022 to 07/13/2022  See note below for Objective Data and Assessment of Progress/Goals.      Past Medical History:  Diagnosis Date   Aortic stenosis    mild-moderate AS 04/22/19 echo   Aortoiliac occlusive disease (HCC) 12/03/2016   Arthritis    Diabetes mellitus without complication (HCC)    per pt she is pre- diabetic   Diverticulitis    Gout    Heart murmur    Hyperlipidemia    Hypertension    Osteoporosis    Sleep apnea    Tobacco use disorder 07/04/2012   Past Surgical History:  Procedure Laterality Date   ABDOMINAL AORTOGRAM W/LOWER EXTREMITY N/A 12/03/2016   Procedure: Abdominal Aortogram w/Lower Extremity;  Surgeon: Chuck Hint, MD;  Location: [facility] INVASIVE CV LAB;  Service: Cardiovascular;  Laterality: N/A;   AORTA -INNOMIATE BYPASS N/A 05/18/2019   Procedure: AORTA -INNOMIATE BYPASS Using Hemashield Gold Graft Size 8mm;  Surgeon: Alleen Borne, MD;  Location: [facility] OR;  Service: Open Heart Surgery;  Laterality: N/A;   aorto- fem bypass Bilateral    aortobiiliac bypass in 2000   DILATATION & CURETTAGE/HYSTEROSCOPY WITH MYOSURE N/A 12/27/2015   Procedure: DILATATION & CURETTAGE/HYSTEROSCOPY;  Surgeon: Patton Salles, MD;  Location: [facility] ORS;  Service: Gynecology;  Laterality: N/A;   DILATION AND CURETTAGE OF UTERUS  11/2015   ELBOW  SURGERY Left ~2007   tendon repair by Dr. Ranell Patrick   EYE SURGERY Bilateral 2008   lasik   JOINT REPLACEMENT     lower aortic bypass  2000   per patient   PERIPHERAL VASCULAR INTERVENTION  12/03/2016   Stenting of anastomotic stenosis of the aortoiliac and right common iliac artery with 7 x 39 mm VBX covered stent and postdilated with 9 mm balloon.   Right innonimate bypass  05/18/2019   Aorto innominate bypass surgery with median sternotomy   STERNOTOMY  05/18/2019   Right aorto-innonimate bypass surgery 05/18/19 Dr. Laneta Simmers   TOTAL HIP ARTHROPLASTY Right 07/27/2014   Procedure: RIGHT TOTAL HIP ARTHROPLASTY ANTERIOR APPROACH;  Surgeon: Durene Romans, MD;  Location: [facility] ORS;  Service: Orthopedics;  Laterality: Right;   TOTAL KNEE ARTHROPLASTY Right 05/28/2022   Procedure: RIGHT TOTAL KNEE ARTHROPLASTY;  Surgeon: Tarry Kos, MD;  Location: [facility] OR;  Service: Orthopedics;  Laterality: Right;   Patient Active Problem List   Diagnosis Date Noted   Primary osteoarthritis of left knee 07/10/2022   Status post total right knee replacement 05/28/2022   Vitamin B12 deficiency 10/31/2021   Small bowel obstruction (HCC)    Partial small bowel obstruction (HCC) 10/28/2021   Primary osteoarthritis of right knee 06/20/2021   PAD (peripheral artery disease) (HCC)    Atherosclerotic stenosis of innominate  artery 05/18/2019   Aortoiliac occlusive disease (HCC) 12/03/2016   Centrilobular emphysema (HCC) 10/03/2016   Right groin pain 02/27/2016   Sacroiliitis (HCC) 02/27/2016   Postmenopausal bleeding 12/01/2015   Psoas tendinitis of right side 10/21/2015   Endometrial thickening on ultra sound 07/28/2015   S/P right THA, 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   Type 2 diabetes mellitus (HCC) 05/18/2014   DDD (degenerative disc disease) 03/18/2013   Metabolic syndrome 07/29/2012   Essential hypertension, benign 07/18/2012   Hyperlipidemia 07/18/2012   Obstructive sleep apnea 07/04/2012   Osteoporosis 07/04/2012   Morbid  obesity (HCC) 07/04/2012    PCP: Lula Olszewski MD   REFERRING PROVIDER: Neomia Dear PA   REFERRING DIAG: Right TKA  THERAPY DIAG:  Stiffness of right knee, not elsewhere classified  Acute pain of right knee  Other abnormalities of gait and mobility  Localized edema  Rationale for Evaluation and Treatment: Rehabilitation  ONSET DATE: 05/28/2022 Days since surgery: 51   SUBJECTIVE:   SUBJECTIVE STATEMENT: Pt states she is "unsure" of how she is doing today. She is experiencing discomfort in the front of the knee. However, she is able to now turn the bicycle over as well as stand/walk throughout the day more.   PERTINENT HISTORY: Aortic stenosis; Pre-diabetic,  Gout ( hasn't had an attack in 1.5 years) Osteopetrosis,  right hip replacement; Left ebow surgery 2007;  PAIN:  Are you having pain? Yes: NPRS scale: 3/10 Pain location: right knee  Pain description: aching  Aggravating factors: night time;  Relieving factors: rest   PRECAUTIONS: None  WEIGHT BEARING RESTRICTIONS: No  FALLS:  Has patient fallen in last 6 months? No  LIVING ENVIRONMENT: 15  going to the second floor. 2 steps going in.   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     PLOF: Independent  PATIENT GOALS:   To get back to exercises   NEXT MD VISIT:   OBJECTIVE:   DIAGNOSTIC FINDINGS:   PATIENT SURVEYS:  FOTO    COGNITION: Overall cognitive status: Within functional limits for tasks assessed     SENSATION: WFL  EDEMA:  Circumferential: 43.5  MUSCLE LENGTH:  POSTURE: No Significant postural limitations  PALPATION: No unexpected tenderness to palpation   LOWER EXTREMITY ROM:  Passive ROM Right eval Left eval R 2/16 2/19 2/26 3/6  Hip flexion        Hip extension        Hip abduction        Hip adduction        Hip internal rotation        Hip external rotation        Knee flexion 95 100 102 110 110   Knee extension -5 -6 -5 -3 -3 0  Ankle dorsiflexion         Ankle plantarflexion        Ankle inversion        Ankle eversion         (Blank rows = not tested)  LOWER EXTREMITY MMT:  MMT Right eval Left eval    Hip flexion 14.6 24.8 29.5 35.9  Hip extension      Hip abduction 23.5 32.8 33.4 32.6  Hip adduction      Hip internal rotation      Hip external rotation      Knee flexion      Knee extension  27.2 25.0 35.2  Ankle dorsiflexion      Ankle plantarflexion  Ankle inversion      Ankle eversion       (Blank rows = not tested)    FUNCTIONAL TESTS:  Mild use of hands for sit to stand      TODAY'S TREATMENT:  3/8  Bike: 6 min (full revolutions) 115 flexion; 0 deg ext following mobilization  Patella mobilizations- grade III in all directions  R knee TKE joint mob grade III Skin rolling and cross friction massage of scar (pt advised to continue with this at home independently as well)  GTB TKE 3x10 4" box lunge knee flexion stretch 5s 10x SLR 2x10 STS from high table 3x5 Heel tap 2x10 4 inch      3/8 Bike: 6 min  LAQ 3x10  SLR 2x15  Mini squats 2x10 reviewed technique with mini squats more pain on the non-surgical knee   Step up 2x10 4 inch  Lateral step up 2x10 4 inch   Heel raise x20   PROM into all planes with focus on flexion     Performed re-assessment on the patient. Reviewed test and measures and goals including strength testing, FOTO, and ROM     3/6 Patella mobilizations- grade III in all directions  R knee TKE joint mob grade III  Recumbent bike- rocking/full x5 min  LAQ 3x10 3s  HR/TR 2x20  Standing TKE RTB 2x10 Standing HS curls 3x10 -Step ups 4" 3x8   2/29 PROM flexion and ext to tolerance IASTM to quad using roller Patella mobilizations  Recumbent bike- rocking/full x5 min  SAQ 3# 2x10 3 sec hold  HR/TR 2x20  Standing hip abduction 3x10ea -Standing marching 2x10ea -Step ups 4" 2x10R   2/22 PROM flexion and ext to tolerance  Knee ext mob grade II-III on  R Creep extension stretch 2 min; heel on foam roller prop Standing gastroc stretch 30s 3x HR/TR 2x20  SLR 3x8 1lb weight  Sidestep at wall 47ft 3x laps SLS 20s 3x with finger touch       PATIENT EDUCATION:  Education details: HEP, symptom management  Person educated: Patient Education method: Explanation, Demonstration, Tactile cues, Verbal cues, and Handouts Education comprehension: verbalized understanding, returned demonstration, verbal cues required, tactile cues required, and needs further education  HOME EXERCISE PROGRAM: Access Code: [email or web address] URL: [email or web address] Date: 06/13/2022 Prepared by: Lorayne Bender  Exercises - Supine Knee Extension Stretch on Towel Roll  - 2 x daily - 7 x weekly - 3 sets - 5 reps - 10 sec hold hold - Supine Heel Slide with Strap  - 2 x daily - 7 x weekly - 3 sets - 10 reps - 5 sec  hold - Supine Quad Set  - 2 x daily - 7 x weekly - 3 sets - 10 reps - Active Straight Leg Raise with Quad Set  - 1 x daily - 7 x weekly - 3 sets - 10 reps  ASSESSMENT:  CLINICAL IMPRESSION: Patient able to increase flexion range of motion today with ability to perform full revolutions on recumbent bike as well as return to 0 degrees extension following mobilizations.  Patient advised to begin self scar tissue mobilization in order to reduce anterior soft tissue restriction.  Exercises progressed today in clinic however home exercise program unchanged in order to assess response to today's session.  Patient is progressing well with therapy and advised that discomfort around the anterior portion of the knee is to be expected with increase in activity and progression of strengthening exercise.Pt would benefit from continued skilled  therapy in order to reach goals and maximize functional R LE strength and ROM for full return to PLOF.    OBJECTIVE IMPAIRMENTS: Abnormal gait, decreased activity tolerance, decreased mobility, difficulty walking, decreased  ROM, decreased strength, increased edema, and pain.   ACTIVITY LIMITATIONS: carrying, lifting, bending, sitting, standing, squatting, sleeping, stairs, transfers, and locomotion level  PARTICIPATION LIMITATIONS: meal prep, cleaning, laundry, driving, shopping, community activity, occupation, and yard work  PERSONAL FACTORS: 1-2 comorbidities: right hip replacement  are also affecting patient's functional outcome.   REHAB POTENTIAL: Excellent  CLINICAL DECISION MAKING: Stable/uncomplicated  EVALUATION COMPLEXITY: Low   GOALS: Goals reviewed with patient? Yes  SHORT TERM GOALS: Target date: 07/12/2022   Patient will increase right knee flexion to 110 degrees Baseline: Goal status: INITIAL  2.  Patient will demonstrate full passive right knee extension Baseline:  Goal status: IN PROGRESS  3.  Patient will be independent with ambulation of a distance of 500 feet without a single-point cane Baseline:  Goal status: INITIAL  4.  Patient will be independent with basic exercise program Baseline:  Goal status: IN PROGRESS  LONG TERM GOALS: Target date: 07/26/2022    Patient will go up and down 8 steps with reciprocal gait without pain Baseline:  Goal status: INITIAL  2.  Patient will demonstrate deep squat without pain in order to pick up items off the ground Baseline:  Goal status: INITIAL  3.  Patient will demonstrate full range of motion in order to perform ADLs without increased pain Baseline:  Goal status: INITIAL  4.  Patient will be independent with full appropriate gym program Baseline:  Goal status: INITIAL  PLAN:  PT FREQUENCY: 2x/week  PT DURATION: 8 weeks  PLANNED INTERVENTIONS: Therapeutic exercises, Therapeutic activity, Neuromuscular re-education, Balance training, Gait training, Patient/Family education, Self Care, Joint mobilization, Stair training, DME instructions, Aquatic Therapy, Cryotherapy, Moist heat, Taping, and Manual therapy  PLAN FOR  NEXT SESSION: Passive range of motion for flexion and emphasis on extension.  Consider NuStep, start balance training,  begin stair training and squat training when able.  Zebedee Iba PT, DPT 07/18/22 11:43 AM

## 2022-07-20 ENCOUNTER — Ambulatory Visit (HOSPITAL_BASED_OUTPATIENT_CLINIC_OR_DEPARTMENT_OTHER): Payer: Medicare Other | Admitting: Physical Therapy

## 2022-07-20 ENCOUNTER — Encounter (HOSPITAL_BASED_OUTPATIENT_CLINIC_OR_DEPARTMENT_OTHER): Payer: Self-pay | Admitting: Physical Therapy

## 2022-07-20 DIAGNOSIS — R2689 Other abnormalities of gait and mobility: Secondary | ICD-10-CM

## 2022-07-20 DIAGNOSIS — R600 Localized edema: Secondary | ICD-10-CM

## 2022-07-20 DIAGNOSIS — M25561 Pain in right knee: Secondary | ICD-10-CM

## 2022-07-20 DIAGNOSIS — M25661 Stiffness of right knee, not elsewhere classified: Secondary | ICD-10-CM

## 2022-07-20 NOTE — Therapy (Signed)
OUTPATIENT PHYSICAL THERAPY LOWER EXTREMITY Treatment / Progress note    Patient Name: Diane Giles MRN: [id number] DOB:06-Dec-1955, 67 y.o., female Today's Date: 07/18/2022  END OF SESSION:  PT End of Session - 07/18/22 1103     Visit Number 10    Number of Visits 16    Date for PT Re-Evaluation 08/09/22    Authorization Type MCR Progress note performed on viist 9.    PT Start Time 1100    PT Stop Time 1139    PT Time Calculation (min) 39 min    Activity Tolerance Patient tolerated treatment well    Behavior During Therapy [facility] for tasks assessed/performed             Progress Note Reporting Period 06/13/2022 to 07/13/2022  See note below for Objective Data and Assessment of Progress/Goals.      Past Medical History:  Diagnosis Date   Aortic stenosis    mild-moderate AS 04/22/19 echo   Aortoiliac occlusive disease (HCC) 12/03/2016   Arthritis    Diabetes mellitus without complication (HCC)    per pt she is pre- diabetic   Diverticulitis    Gout    Heart murmur    Hyperlipidemia    Hypertension    Osteoporosis    Sleep apnea    Tobacco use disorder 07/04/2012   Past Surgical History:  Procedure Laterality Date   ABDOMINAL AORTOGRAM W/LOWER EXTREMITY N/A 12/03/2016   Procedure: Abdominal Aortogram w/Lower Extremity;  Surgeon: Chuck Hint, MD;  Location: [facility] INVASIVE CV LAB;  Service: Cardiovascular;  Laterality: N/A;   AORTA -INNOMIATE BYPASS N/A 05/18/2019   Procedure: AORTA -INNOMIATE BYPASS Using Hemashield Gold Graft Size 8mm;  Surgeon: Alleen Borne, MD;  Location: [facility] OR;  Service: Open Heart Surgery;  Laterality: N/A;   aorto- fem bypass Bilateral    aortobiiliac bypass in 2000   DILATATION & CURETTAGE/HYSTEROSCOPY WITH MYOSURE N/A 12/27/2015   Procedure: DILATATION & CURETTAGE/HYSTEROSCOPY;  Surgeon: Patton Salles, MD;  Location: [facility] ORS;  Service: Gynecology;  Laterality: N/A;   DILATION AND CURETTAGE OF UTERUS  11/2015   ELBOW  SURGERY Left ~2007   tendon repair by Dr. Ranell Patrick   EYE SURGERY Bilateral 2008   lasik   JOINT REPLACEMENT     lower aortic bypass  2000   per patient   PERIPHERAL VASCULAR INTERVENTION  12/03/2016   Stenting of anastomotic stenosis of the aortoiliac and right common iliac artery with 7 x 39 mm VBX covered stent and postdilated with 9 mm balloon.   Right innonimate bypass  05/18/2019   Aorto innominate bypass surgery with median sternotomy   STERNOTOMY  05/18/2019   Right aorto-innonimate bypass surgery 05/18/19 Dr. Laneta Simmers   TOTAL HIP ARTHROPLASTY Right 07/27/2014   Procedure: RIGHT TOTAL HIP ARTHROPLASTY ANTERIOR APPROACH;  Surgeon: Durene Romans, MD;  Location: [facility] ORS;  Service: Orthopedics;  Laterality: Right;   TOTAL KNEE ARTHROPLASTY Right 05/28/2022   Procedure: RIGHT TOTAL KNEE ARTHROPLASTY;  Surgeon: Tarry Kos, MD;  Location: [facility] OR;  Service: Orthopedics;  Laterality: Right;   Patient Active Problem List   Diagnosis Date Noted   Primary osteoarthritis of left knee 07/10/2022   Status post total right knee replacement 05/28/2022   Vitamin B12 deficiency 10/31/2021   Small bowel obstruction (HCC)    Partial small bowel obstruction (HCC) 10/28/2021   Primary osteoarthritis of right knee 06/20/2021   PAD (peripheral artery disease) (HCC)    Atherosclerotic stenosis of innominate  artery 05/18/2019   Aortoiliac occlusive disease (HCC) 12/03/2016   Centrilobular emphysema (HCC) 10/03/2016   Right groin pain 02/27/2016   Sacroiliitis (HCC) 02/27/2016   Postmenopausal bleeding 12/01/2015   Psoas tendinitis of right side 10/21/2015   Endometrial thickening on ultra sound 07/28/2015   S/P right THA, 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   Type 2 diabetes mellitus (HCC) 05/18/2014   DDD (degenerative disc disease) 03/18/2013   Metabolic syndrome 07/29/2012   Essential hypertension, benign 07/18/2012   Hyperlipidemia 07/18/2012   Obstructive sleep apnea 07/04/2012   Osteoporosis 07/04/2012   Morbid  obesity (HCC) 07/04/2012    PCP: Lula Olszewski MD   REFERRING PROVIDER: Neomia Dear PA   REFERRING DIAG: Right TKA  THERAPY DIAG:  Stiffness of right knee, not elsewhere classified  Acute pain of right knee  Other abnormalities of gait and mobility  Localized edema  Rationale for Evaluation and Treatment: Rehabilitation  ONSET DATE: 05/28/2022 Days since surgery: 51   SUBJECTIVE:   SUBJECTIVE STATEMENT: Pt states she is "unsure" of how she is doing today. She is experiencing discomfort in the front of the knee. However, she is able to now turn the bicycle over as well as stand/walk throughout the day more.   PERTINENT HISTORY: Aortic stenosis; Pre-diabetic,  Gout ( hasn't had an attack in 1.5 years) Osteopetrosis,  right hip replacement; Left ebow surgery 2007;  PAIN:  Are you having pain? Yes: NPRS scale: 3/10 Pain location: right knee  Pain description: aching  Aggravating factors: night time;  Relieving factors: rest   PRECAUTIONS: None  WEIGHT BEARING RESTRICTIONS: No  FALLS:  Has patient fallen in last 6 months? No  LIVING ENVIRONMENT: 15  going to the second floor. 2 steps going in.   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     PLOF: Independent  PATIENT GOALS:   To get back to exercises   NEXT MD VISIT:   OBJECTIVE:   DIAGNOSTIC FINDINGS:   PATIENT SURVEYS:  FOTO    COGNITION: Overall cognitive status: Within functional limits for tasks assessed     SENSATION: WFL  EDEMA:  Circumferential: 43.5  MUSCLE LENGTH:  POSTURE: No Significant postural limitations  PALPATION: No unexpected tenderness to palpation   LOWER EXTREMITY ROM:  Passive ROM Right eval Left eval R 2/16 2/19 2/26 3/6  Hip flexion        Hip extension        Hip abduction        Hip adduction        Hip internal rotation        Hip external rotation        Knee flexion 95 100 102 110 110   Knee extension -5 -6 -5 -3 -3 0  Ankle dorsiflexion         Ankle plantarflexion        Ankle inversion        Ankle eversion         (Blank rows = not tested)  LOWER EXTREMITY MMT:  MMT Right eval Left eval    Hip flexion 14.6 24.8 29.5 35.9  Hip extension      Hip abduction 23.5 32.8 33.4 32.6  Hip adduction      Hip internal rotation      Hip external rotation      Knee flexion      Knee extension  27.2 25.0 35.2  Ankle dorsiflexion      Ankle plantarflexion  Ankle inversion      Ankle eversion       (Blank rows = not tested)    FUNCTIONAL TESTS:  Mild use of hands for sit to stand      TODAY'S TREATMENT:  3/15 Patella mobilizations- grade III in all directions  Nu-ste Level 5 6 min  SLR 3x10 1.5 lbs  LAQ 1.5 lbs 3x10   Cybex leg press 70 lbs 2x15   Step down 4 inch x20  Step up 6 inch x20   Hip abduction machine 3x10 70 lbs    3/8  Bike: 6 min (full revolutions) 115 flexion; 0 deg ext following mobilization  Patella mobilizations- grade III in all directions  R knee TKE joint mob grade III Skin rolling and cross friction massage of scar (pt advised to continue with this at home independently as well)  GTB TKE 3x10 4" box lunge knee flexion stretch 5s 10x SLR 2x10 STS from high table 3x5 Heel tap 2x10 4 inch      3/8 Bike: 6 min  LAQ 3x10  SLR 2x15  Mini squats 2x10 reviewed technique with mini squats more pain on the non-surgical knee   Step up 2x10 4 inch  Lateral step up 2x10 4 inch   Heel raise x20   PROM into all planes with focus on flexion     Performed re-assessment on the patient. Reviewed test and measures and goals including strength testing, FOTO, and ROM     3/6 Patella mobilizations- grade III in all directions  R knee TKE joint mob grade III  Recumbent bike- rocking/full x5 min  LAQ 3x10 3s  HR/TR 2x20  Standing TKE RTB 2x10 Standing HS curls 3x10 -Step ups 4" 3x8   2/29 PROM flexion and ext to tolerance IASTM to quad using roller Patella  mobilizations  Recumbent bike- rocking/full x5 min  SAQ 3# 2x10 3 sec hold  HR/TR 2x20  Standing hip abduction 3x10ea -Standing marching 2x10ea -Step ups 4" 2x10   2/22 PROM flexion and ext to tolerance  Knee ext mob grade II-III on R Creep extension stretch 2 min; heel on foam roller prop Standing gastroc stretch 30s 3x HR/TR 2x20  SLR 3x8 1lb weight  Sidestep at wall 69ft 3x laps SLS 20s 3x with finger touch       PATIENT EDUCATION:  Education details: HEP, symptom management  Person educated: Patient Education method: Explanation, Demonstration, Tactile cues, Verbal cues, and Handouts Education comprehension: verbalized understanding, returned demonstration, verbal cues required, tactile cues required, and needs further education  HOME EXERCISE PROGRAM: Access Code: [email or web address] URL: [email or web address] Date: 06/13/2022 Prepared by: Lorayne Bender  Exercises - Supine Knee Extension Stretch on Towel Roll  - 2 x daily - 7 x weekly - 3 sets - 5 reps - 10 sec hold hold - Supine Heel Slide with Strap  - 2 x daily - 7 x weekly - 3 sets - 10 reps - 5 sec  hold - Supine Quad Set  - 2 x daily - 7 x weekly - 3 sets - 10 reps - Active Straight Leg Raise with Quad Set  - 1 x daily - 7 x weekly - 3 sets - 10 reps  ASSESSMENT:  CLINICAL IMPRESSION: The patient is making good progress. Her range is progressing well. We reviewed leg press in the gym. She did well with cybex leg press. She had left knee pain with the LF leg press. We will continue to progress as tolerated.  OBJECTIVE IMPAIRMENTS: Abnormal gait, decreased activity tolerance, decreased mobility, difficulty walking, decreased ROM, decreased strength, increased edema, and pain.   ACTIVITY LIMITATIONS: carrying, lifting, bending, sitting, standing, squatting, sleeping, stairs, transfers, and locomotion level  PARTICIPATION LIMITATIONS: meal prep, cleaning, laundry, driving, shopping, community  activity, occupation, and yard work  PERSONAL FACTORS: 1-2 comorbidities: right hip replacement  are also affecting patient's functional outcome.   REHAB POTENTIAL: Excellent  CLINICAL DECISION MAKING: Stable/uncomplicated  EVALUATION COMPLEXITY: Low   GOALS: Goals reviewed with patient? Yes  SHORT TERM GOALS: Target date: 07/12/2022   Patient will increase right knee flexion to 110 degrees Baseline: Goal status: INITIAL  2.  Patient will demonstrate full passive right knee extension Baseline:  Goal status: IN PROGRESS  3.  Patient will be independent with ambulation of a distance of 500 feet without a single-point cane Baseline:  Goal status: INITIAL  4.  Patient will be independent with basic exercise program Baseline:  Goal status: IN PROGRESS  LONG TERM GOALS: Target date: 07/26/2022    Patient will go up and down 8 steps with reciprocal gait without pain Baseline:  Goal status: INITIAL  2.  Patient will demonstrate deep squat without pain in order to pick up items off the ground Baseline:  Goal status: INITIAL  3.  Patient will demonstrate full range of motion in order to perform ADLs without increased pain Baseline:  Goal status: INITIAL  4.  Patient will be independent with full appropriate gym program Baseline:  Goal status: INITIAL  PLAN:  PT FREQUENCY: 2x/week  PT DURATION: 8 weeks  PLANNED INTERVENTIONS: Therapeutic exercises, Therapeutic activity, Neuromuscular re-education, Balance training, Gait training, Patient/Family education, Self Care, Joint mobilization, Stair training, DME instructions, Aquatic Therapy, Cryotherapy, Moist heat, Taping, and Manual therapy  PLAN FOR NEXT SESSION: Passive range of motion for flexion and emphasis on extension.  Consider NuStep, start balance training,  begin stair training and squat training when able.  Lorayne Bender PT DPT  07/18/22 11:43 AM

## 2022-07-25 ENCOUNTER — Ambulatory Visit (HOSPITAL_BASED_OUTPATIENT_CLINIC_OR_DEPARTMENT_OTHER): Payer: Medicare Other | Admitting: Physical Therapy

## 2022-07-25 ENCOUNTER — Encounter (HOSPITAL_BASED_OUTPATIENT_CLINIC_OR_DEPARTMENT_OTHER): Payer: Self-pay | Admitting: Physical Therapy

## 2022-07-25 DIAGNOSIS — R600 Localized edema: Secondary | ICD-10-CM

## 2022-07-25 DIAGNOSIS — M25661 Stiffness of right knee, not elsewhere classified: Secondary | ICD-10-CM

## 2022-07-25 DIAGNOSIS — R2689 Other abnormalities of gait and mobility: Secondary | ICD-10-CM

## 2022-07-25 DIAGNOSIS — M25561 Pain in right knee: Secondary | ICD-10-CM

## 2022-07-25 NOTE — Therapy (Signed)
OUTPATIENT PHYSICAL THERAPY LOWER EXTREMITY Treatment / Progress note    Patient Name: Diane Giles MRN: [id number] DOB:27-Aug-1955, 67 y.o., female Today's Date: 07/25/2022  END OF SESSION:  PT End of Session - 07/25/22 1142     Visit Number 12    Number of Visits 16    Date for PT Re-Evaluation 08/09/22    Authorization Type MCR Progress note performed on viist 9.    PT Start Time 1100    PT Stop Time 1143    PT Time Calculation (min) 43 min    Activity Tolerance Patient tolerated treatment well    Behavior During Therapy [facility] for tasks assessed/performed              Progress Note Reporting Period 06/13/2022 to 07/13/2022  See note below for Objective Data and Assessment of Progress/Goals.      Past Medical History:  Diagnosis Date   Aortic stenosis    mild-moderate AS 04/22/19 echo   Aortoiliac occlusive disease (HCC) 12/03/2016   Arthritis    Diabetes mellitus without complication (HCC)    per pt she is pre- diabetic   Diverticulitis    Gout    Heart murmur    Hyperlipidemia    Hypertension    Osteoporosis    Sleep apnea    Tobacco use disorder 07/04/2012   Past Surgical History:  Procedure Laterality Date   ABDOMINAL AORTOGRAM W/LOWER EXTREMITY N/A 12/03/2016   Procedure: Abdominal Aortogram w/Lower Extremity;  Surgeon: Chuck Hint, MD;  Location: [facility] INVASIVE CV LAB;  Service: Cardiovascular;  Laterality: N/A;   AORTA -INNOMIATE BYPASS N/A 05/18/2019   Procedure: AORTA -INNOMIATE BYPASS Using Hemashield Gold Graft Size 8mm;  Surgeon: Alleen Borne, MD;  Location: [facility] OR;  Service: Open Heart Surgery;  Laterality: N/A;   aorto- fem bypass Bilateral    aortobiiliac bypass in 2000   DILATATION & CURETTAGE/HYSTEROSCOPY WITH MYOSURE N/A 12/27/2015   Procedure: DILATATION & CURETTAGE/HYSTEROSCOPY;  Surgeon: Patton Salles, MD;  Location: [facility] ORS;  Service: Gynecology;  Laterality: N/A;   DILATION AND CURETTAGE OF UTERUS  11/2015   ELBOW  SURGERY Left ~2007   tendon repair by Dr. Ranell Patrick   EYE SURGERY Bilateral 2008   lasik   JOINT REPLACEMENT     lower aortic bypass  2000   per patient   PERIPHERAL VASCULAR INTERVENTION  12/03/2016   Stenting of anastomotic stenosis of the aortoiliac and right common iliac artery with 7 x 39 mm VBX covered stent and postdilated with 9 mm balloon.   Right innonimate bypass  05/18/2019   Aorto innominate bypass surgery with median sternotomy   STERNOTOMY  05/18/2019   Right aorto-innonimate bypass surgery 05/18/19 Dr. Laneta Simmers   TOTAL HIP ARTHROPLASTY Right 07/27/2014   Procedure: RIGHT TOTAL HIP ARTHROPLASTY ANTERIOR APPROACH;  Surgeon: Durene Romans, MD;  Location: [facility] ORS;  Service: Orthopedics;  Laterality: Right;   TOTAL KNEE ARTHROPLASTY Right 05/28/2022   Procedure: RIGHT TOTAL KNEE ARTHROPLASTY;  Surgeon: Tarry Kos, MD;  Location: [facility] OR;  Service: Orthopedics;  Laterality: Right;   Patient Active Problem List   Diagnosis Date Noted   Primary osteoarthritis of left knee 07/10/2022   Status post total right knee replacement 05/28/2022   Vitamin B12 deficiency 10/31/2021   Small bowel obstruction (HCC)    Partial small bowel obstruction (HCC) 10/28/2021   Primary osteoarthritis of right knee 06/20/2021   PAD (peripheral artery disease) (HCC)    Atherosclerotic stenosis of  innominate artery 05/18/2019   Aortoiliac occlusive disease (HCC) 12/03/2016   Centrilobular emphysema (HCC) 10/03/2016   Right groin pain 02/27/2016   Sacroiliitis (HCC) 02/27/2016   Postmenopausal bleeding 12/01/2015   Psoas tendinitis of right side 10/21/2015   Endometrial thickening on ultra sound 07/28/2015   S/P right THA, 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   Type 2 diabetes mellitus (HCC) 05/18/2014   DDD (degenerative disc disease) 03/18/2013   Metabolic syndrome 07/29/2012   Essential hypertension, benign 07/18/2012   Hyperlipidemia 07/18/2012   Obstructive sleep apnea 07/04/2012   Osteoporosis 07/04/2012   Morbid  obesity (HCC) 07/04/2012    PCP: Lula Olszewski MD   REFERRING PROVIDER: Neomia Dear PA   REFERRING DIAG: Right TKA  THERAPY DIAG:  Stiffness of right knee, not elsewhere classified  Acute pain of right knee  Other abnormalities of gait and mobility  Localized edema  Rationale for Evaluation and Treatment: Rehabilitation  ONSET DATE: 05/28/2022 Days since surgery: 58   SUBJECTIVE:   SUBJECTIVE STATEMENT: Pt states the knee did well after last time. No increase in soreness or pain since last session. She is still able to turn the bike over and stretch the front of the knee. Pt was able to go up reciprocally.  PERTINENT HISTORY: Aortic stenosis; Pre-diabetic,  Gout ( hasn't had an attack in 1.5 years) Osteopetrosis,  right hip replacement; Left ebow surgery 2007;  PAIN:  Are you having pain? No: NPRS scale: 0/10 Pain location: right knee  Pain description: aching  Aggravating factors: night time;  Relieving factors: rest   PRECAUTIONS: None  WEIGHT BEARING RESTRICTIONS: No  FALLS:  Has patient fallen in last 6 months? No  LIVING ENVIRONMENT: 15  going to the second floor. 2 steps going in.   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     PLOF: Independent  PATIENT GOALS:   To get back to exercises   NEXT MD VISIT:   OBJECTIVE:   DIAGNOSTIC FINDINGS:   PATIENT SURVEYS:  FOTO    COGNITION: Overall cognitive status: Within functional limits for tasks assessed     SENSATION: WFL  EDEMA:  Circumferential: 43.5  MUSCLE LENGTH:  POSTURE: No Significant postural limitations  PALPATION: No unexpected tenderness to palpation   LOWER EXTREMITY ROM:  Passive ROM Right eval Left eval R 2/16 2/19 2/26 3/6  Hip flexion        Hip extension        Hip abduction        Hip adduction        Hip internal rotation        Hip external rotation        Knee flexion 95 100 102 110 110   Knee extension -5 -6 -5 -3 -3 0  Ankle dorsiflexion         Ankle plantarflexion        Ankle inversion        Ankle eversion         (Blank rows = not tested)  LOWER EXTREMITY MMT:  MMT Right eval Left eval    Hip flexion 14.6 24.8 29.5 35.9  Hip extension      Hip abduction 23.5 32.8 33.4 32.6  Hip adduction      Hip internal rotation      Hip external rotation      Knee flexion      Knee extension  27.2 25.0 35.2  Ankle dorsiflexion      Ankle plantarflexion  Ankle inversion      Ankle eversion       (Blank rows = not tested)    FUNCTIONAL TESTS:  Mild use of hands for sit to stand      TODAY'S TREATMENT:  3/20  Bike: 6 min (full revolutions) seat #4; Lvl 1 117 flexion; 0 deg ext following mobilization  Patella mobilizations- grade III in all directions  R knee TKE joint mob grade III  GTB TKE 3x10 8" box step up 3x8 (with UE)  SLR 3x10 1.5lbs Prone HS curl 1.5lbs 3x10 LAQ  3lbs 2x10 2s  STS from high table (bothers L) Shuttle leg press staggered (R low) 3x8 75lbs    3/15 Patella mobilizations- grade III in all directions  Nu-ste Level 5 6 min  SLR 3x10 1.5 lbs  LAQ 1.5 lbs 3x10   Cybex leg press 70 lbs 2x15   Step down 4 inch x20  Step up 6 inch x20   Hip abduction machine 3x10 70 lbs    3/8  Bike: 6 min (full revolutions) 115 flexion; 0 deg ext following mobilization  Patella mobilizations- grade III in all directions  R knee TKE joint mob grade III Skin rolling and cross friction massage of scar (pt advised to continue with this at home independently as well)  GTB TKE 3x10 4" box lunge knee flexion stretch 5s 10x SLR 2x10 STS from high table 3x5 Heel tap 2x10 4 inch      3/8 Bike: 6 min  LAQ 3x10  SLR 2x15  Mini squats 2x10 reviewed technique with mini squats more pain on the non-surgical knee   Step up 2x10 4 inch  Lateral step up 2x10 4 inch   Heel raise x20   PROM into all planes with focus on flexion     Performed re-assessment on the patient. Reviewed test  and measures and goals including strength testing, FOTO, and ROM     3/6 Patella mobilizations- grade III in all directions  R knee TKE joint mob grade III  Recumbent bike- rocking/full x5 min  LAQ 3x10 3s  HR/TR 2x20  Standing TKE RTB 2x10 Standing HS curls 3x10 -Step ups 4" 3x8   2/29 PROM flexion and ext to tolerance IASTM to quad using roller Patella mobilizations  Recumbent bike- rocking/full x5 min  SAQ 3# 2x10 3 sec hold  HR/TR 2x20  Standing hip abduction 3x10ea -Standing marching 2x10ea -Step ups 4" 2x10   2/22 PROM flexion and ext to tolerance  Knee ext mob grade II-III on R Creep extension stretch 2 min; heel on foam roller prop Standing gastroc stretch 30s 3x HR/TR 2x20  SLR 3x8 1lb weight  Sidestep at wall 53ft 3x laps SLS 20s 3x with finger touch       PATIENT EDUCATION:  Education details: HEP, symptom management  Person educated: Patient Education method: Explanation, Demonstration, Tactile cues, Verbal cues, and Handouts Education comprehension: verbalized understanding, returned demonstration, verbal cues required, tactile cues required, and needs further education  HOME EXERCISE PROGRAM: Access Code: [email or web address] URL: [email or web address] Date: 06/13/2022 Prepared by: Lorayne Bender  Exercises - Supine Knee Extension Stretch on Towel Roll  - 2 x daily - 7 x weekly - 3 sets - 5 reps - 10 sec hold hold - Supine Heel Slide with Strap  - 2 x daily - 7 x weekly - 3 sets - 10 reps - 5 sec  hold - Supine Quad Set  - 2 x daily - 7 x weekly -  3 sets - 10 reps - Active Straight Leg Raise with Quad Set  - 1 x daily - 7 x weekly - 3 sets - 10 reps  ASSESSMENT:  CLINICAL IMPRESSION: Pt with good tolerance to increase in difficulty of exercise as well as more aggressive stretching for the R knee. Pt does have extensor lag with added resistance but does have full TKE with reset between reps. HEP updated at this time for greater  strengthening at home. Plan to progress as tolerated. Pt is progressing well with PROM but requires more assistance with AROM to reach end range. Pt would benefit from continued skilled therapy in order to reach goals and maximize functional R knee strength and ROM for full return to PLOF.    OBJECTIVE IMPAIRMENTS: Abnormal gait, decreased activity tolerance, decreased mobility, difficulty walking, decreased ROM, decreased strength, increased edema, and pain.   ACTIVITY LIMITATIONS: carrying, lifting, bending, sitting, standing, squatting, sleeping, stairs, transfers, and locomotion level  PARTICIPATION LIMITATIONS: meal prep, cleaning, laundry, driving, shopping, community activity, occupation, and yard work  PERSONAL FACTORS: 1-2 comorbidities: right hip replacement  are also affecting patient's functional outcome.   REHAB POTENTIAL: Excellent  CLINICAL DECISION MAKING: Stable/uncomplicated  EVALUATION COMPLEXITY: Low   GOALS: Goals reviewed with patient? Yes  SHORT TERM GOALS: Target date: 07/12/2022   Patient will increase right knee flexion to 110 degrees Baseline: Goal status: INITIAL  2.  Patient will demonstrate full passive right knee extension Baseline:  Goal status: IN PROGRESS  3.  Patient will be independent with ambulation of a distance of 500 feet without a single-point cane Baseline:  Goal status: INITIAL  4.  Patient will be independent with basic exercise program Baseline:  Goal status: IN PROGRESS  LONG TERM GOALS: Target date: 07/26/2022    Patient will go up and down 8 steps with reciprocal gait without pain Baseline:  Goal status: INITIAL  2.  Patient will demonstrate deep squat without pain in order to pick up items off the ground Baseline:  Goal status: INITIAL  3.  Patient will demonstrate full range of motion in order to perform ADLs without increased pain Baseline:  Goal status: INITIAL  4.  Patient will be independent with full  appropriate gym program Baseline:  Goal status: INITIAL  PLAN:  PT FREQUENCY: 2x/week  PT DURATION: 8 weeks  PLANNED INTERVENTIONS: Therapeutic exercises, Therapeutic activity, Neuromuscular re-education, Balance training, Gait training, Patient/Family education, Self Care, Joint mobilization, Stair training, DME instructions, Aquatic Therapy, Cryotherapy, Moist heat, Taping, and Manual therapy  PLAN FOR NEXT SESSION: Passive range of motion for flexion and emphasis on extension.  Consider NuStep, start balance training,  begin stair training and squat training when able.  Zebedee Iba PT, DPT 07/25/22 11:43 AM

## 2022-07-27 ENCOUNTER — Ambulatory Visit (HOSPITAL_BASED_OUTPATIENT_CLINIC_OR_DEPARTMENT_OTHER): Payer: Medicare Other | Admitting: Physical Therapy

## 2022-07-27 ENCOUNTER — Encounter (HOSPITAL_BASED_OUTPATIENT_CLINIC_OR_DEPARTMENT_OTHER): Payer: Self-pay | Admitting: Physical Therapy

## 2022-07-27 DIAGNOSIS — M25661 Stiffness of right knee, not elsewhere classified: Secondary | ICD-10-CM | POA: Diagnosis not present

## 2022-07-27 DIAGNOSIS — R600 Localized edema: Secondary | ICD-10-CM

## 2022-07-27 DIAGNOSIS — R2689 Other abnormalities of gait and mobility: Secondary | ICD-10-CM

## 2022-07-27 DIAGNOSIS — M25561 Pain in right knee: Secondary | ICD-10-CM

## 2022-07-27 NOTE — Therapy (Signed)
OUTPATIENT PHYSICAL THERAPY LOWER EXTREMITY Treatment / Progress note    Patient Name: Diane Giles MRN: [id number] DOB:[birth year], 67 y.o., female Today's Date: 07/27/2022  END OF SESSION:     Progress Note Reporting Period 06/13/2022 to 07/13/2022  See note below for Objective Data and Assessment of Progress/Goals.      Past Medical History:  Diagnosis Date   Aortic stenosis    mild-moderate AS 04/22/19 echo   Aortoiliac occlusive disease (HCC) 12/03/2016   Arthritis    Diabetes mellitus without complication (HCC)    per pt she is pre- diabetic   Diverticulitis    Gout    Heart murmur    Hyperlipidemia    Hypertension    Osteoporosis    Sleep apnea    Tobacco use disorder 07/04/2012   Past Surgical History:  Procedure Laterality Date   ABDOMINAL AORTOGRAM W/LOWER EXTREMITY N/A 12/03/2016   Procedure: Abdominal Aortogram w/Lower Extremity;  Surgeon: Chuck Hint, MD;  Location: [facility] INVASIVE CV LAB;  Service: Cardiovascular;  Laterality: N/A;   AORTA -INNOMIATE BYPASS N/A 05/18/2019   Procedure: AORTA -INNOMIATE BYPASS Using Hemashield Gold Graft Size 8mm;  Surgeon: Alleen Borne, MD;  Location: [facility] OR;  Service: Open Heart Surgery;  Laterality: N/A;   aorto- fem bypass Bilateral    aortobiiliac bypass in 2000   DILATATION & CURETTAGE/HYSTEROSCOPY WITH MYOSURE N/A 12/27/2015   Procedure: DILATATION & CURETTAGE/HYSTEROSCOPY;  Surgeon: Patton Salles, MD;  Location: [facility] ORS;  Service: Gynecology;  Laterality: N/A;   DILATION AND CURETTAGE OF UTERUS  11/2015   ELBOW SURGERY Left ~2007   tendon repair by Dr. Ranell Patrick   EYE SURGERY Bilateral 2008   lasik   JOINT REPLACEMENT     lower aortic bypass  2000   per patient   PERIPHERAL VASCULAR INTERVENTION  12/03/2016   Stenting of anastomotic stenosis of the aortoiliac and right common iliac artery with 7 x 39 mm VBX covered stent and postdilated with 9 mm balloon.   Right innonimate bypass  05/18/2019    Aorto innominate bypass surgery with median sternotomy   STERNOTOMY  05/18/2019   Right aorto-innonimate bypass surgery 05/18/19 Dr. Laneta Simmers   TOTAL HIP ARTHROPLASTY Right 07/27/2014   Procedure: RIGHT TOTAL HIP ARTHROPLASTY ANTERIOR APPROACH;  Surgeon: Durene Romans, MD;  Location: [facility];  Service: Orthopedics;  Laterality: Right;   TOTAL KNEE ARTHROPLASTY Right 05/28/2022   Procedure: RIGHT TOTAL KNEE ARTHROPLASTY;  Surgeon: Tarry Kos, MD;  Location: [facility] OR;  Service: Orthopedics;  Laterality: Right;   Patient Active Problem List   Diagnosis Date Noted   Primary osteoarthritis of left knee 07/10/2022   Status post total right knee replacement 05/28/2022   Vitamin B12 deficiency 10/31/2021   Small bowel obstruction (HCC)    Partial small bowel obstruction (HCC) 10/28/2021   Primary osteoarthritis of right knee 06/20/2021   PAD (peripheral artery disease) (HCC)    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   Aortoiliac occlusive disease (HCC) 12/03/2016   Centrilobular emphysema (HCC) 10/03/2016   Right groin pain 02/27/2016   Sacroiliitis (HCC) 02/27/2016   Postmenopausal bleeding 12/01/2015   Psoas tendinitis of right side 10/21/2015   Endometrial thickening on ultra sound 07/28/2015   S/P right THA, 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   Type 2 diabetes mellitus (HCC) 05/18/2014   DDD (degenerative disc disease) 03/18/2013   Metabolic syndrome 07/29/2012   Essential hypertension, benign 07/18/2012   Hyperlipidemia 07/18/2012   Obstructive sleep apnea 07/04/2012  Osteoporosis 07/04/2012   Morbid obesity (HCC) 07/04/2012    PCP: Lula Olszewski MD   REFERRING PROVIDER: Neomia Dear PA   REFERRING DIAG: Right TKA  THERAPY DIAG:  No diagnosis found.  Rationale for Evaluation and Treatment: Rehabilitation  ONSET DATE: 05/28/2022 Days since surgery: 60   SUBJECTIVE:   SUBJECTIVE STATEMENT:  Patient has no compalins today. She just reports minor soreness.   PERTINENT  HISTORY: Aortic stenosis; Pre-diabetic,  Gout ( hasn't had an attack in 1.5 years) Osteopetrosis,  right hip replacement; Left ebow surgery 2007;  PAIN:  Are you having pain? No: NPRS scale: 0/10 Pain location: right knee  Pain description: aching  Aggravating factors: night time;  Relieving factors: rest   PRECAUTIONS: None  WEIGHT BEARING RESTRICTIONS: No  FALLS:  Has patient fallen in last 6 months? No  LIVING ENVIRONMENT: 13  going to the second floor. 2 steps going in.   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     PLOF: Independent  PATIENT GOALS:   To get back to exercises   NEXT MD VISIT:   OBJECTIVE:   DIAGNOSTIC FINDINGS:   PATIENT SURVEYS:  FOTO    COGNITION: Overall cognitive status: Within functional limits for tasks assessed     SENSATION: WFL  EDEMA:  Circumferential: 43.5  MUSCLE LENGTH:  POSTURE: No Significant postural limitations  PALPATION: No unexpected tenderness to palpation   LOWER EXTREMITY ROM:  Passive ROM Right eval Left eval R 2/16 2/19 2/26 3/6  Hip flexion        Hip extension        Hip abduction        Hip adduction        Hip internal rotation        Hip external rotation        Knee flexion 95 100 102 110 110   Knee extension -5 -6 -5 -3 -3 0  Ankle dorsiflexion        Ankle plantarflexion        Ankle inversion        Ankle eversion         (Blank rows = not tested)  LOWER EXTREMITY MMT:  MMT Right eval Left eval    Hip flexion 14.6 24.8 29.5 35.9  Hip extension      Hip abduction 23.5 32.8 33.4 32.6  Hip adduction      Hip internal rotation      Hip external rotation      Knee flexion      Knee extension  27.2 25.0 35.2  Ankle dorsiflexion      Ankle plantarflexion      Ankle inversion      Ankle eversion       (Blank rows = not tested)    FUNCTIONAL TESTS:  Mild use of hands for sit to stand      TODAY'S TREATMENT:    3/22 Nu-step 5 min L4  SLR 3x10 1.5lbs Prone HS  curl 1.5lbs 3x10 LAQ  3lbs 2x10 2s   Step up 8 inch 2x10  Lateral step 6 inch  2x10   Step onto air-ex x20 fwd x20 lateral   Leg press 3x15   3/20  Bike: 6 min (full revolutions) seat #4; Lvl 1 117 flexion; 0 deg ext following mobilization  Patella mobilizations- grade III in all directions  R knee TKE joint mob grade III  GTB TKE 3x10 8" box step up 3x8 (with UE)  SLR  3x10 1.5lbs Prone HS curl 1.5lbs 3x10 LAQ  3lbs 2x10 2s  STS from high table (bothers L) Shuttle leg press staggered (R low) 3x8 75lbs    3/15 Patella mobilizations- grade III in all directions  Nu-ste Level 5 6 min  SLR 3x10 1.5 lbs  LAQ 1.5 lbs 3x10   Cybex leg press 70 lbs 2x15   Step down 4 inch x20  Step up 6 inch x20   Hip abduction machine 3x10 70 lbs      PATIENT EDUCATION:  Education details: HEP, symptom management  Person educated: Patient Education method: Explanation, Demonstration, Tactile cues, Verbal cues, and Handouts Education comprehension: verbalized understanding, returned demonstration, verbal cues required, tactile cues required, and needs further education  HOME EXERCISE PROGRAM: Access Code: [email or web address] URL: [email or web address] Date: 06/13/2022 Prepared by: Lorayne Bender  Exercises - Supine Knee Extension Stretch on Towel Roll  - 2 x daily - 7 x weekly - 3 sets - 5 reps - 10 sec hold hold - Supine Heel Slide with Strap  - 2 x daily - 7 x weekly - 3 sets - 10 reps - 5 sec  hold - Supine Quad Set  - 2 x daily - 7 x weekly - 3 sets - 10 reps - Active Straight Leg Raise with Quad Set  - 1 x daily - 7 x weekly - 3 sets - 10 reps  ASSESSMENT:  CLINICAL IMPRESSION: Pt did well in therapy session. Pt reported no pain throughout the entire session. Pt performed leg extension machine with no pain. Patient was informed that they can add this in their HEP and at the gym. Pt did well with forward steps and lateral steps on foam pad while maintaining her balance.  Will continue to progress patient as tolerated. Pt would benefit from continued skilled therapy in order to reach goals.   OBJECTIVE IMPAIRMENTS: Abnormal gait, decreased activity tolerance, decreased mobility, difficulty walking, decreased ROM, decreased strength, increased edema, and pain.   ACTIVITY LIMITATIONS: carrying, lifting, bending, sitting, standing, squatting, sleeping, stairs, transfers, and locomotion level  PARTICIPATION LIMITATIONS: meal prep, cleaning, laundry, driving, shopping, community activity, occupation, and yard work  PERSONAL FACTORS: 1-2 comorbidities: right hip replacement  are also affecting patient's functional outcome.   REHAB POTENTIAL: Excellent  CLINICAL DECISION MAKING: Stable/uncomplicated  EVALUATION COMPLEXITY: Low   GOALS: Goals reviewed with patient? Yes  SHORT TERM GOALS: Target date: 07/12/2022   Patient will increase right knee flexion to 110 degrees Baseline: Goal status: INITIAL  2.  Patient will demonstrate full passive right knee extension Baseline:  Goal status: IN PROGRESS  3.  Patient will be independent with ambulation of a distance of 500 feet without a single-point cane Baseline:  Goal status: INITIAL  4.  Patient will be independent with basic exercise program Baseline:  Goal status: IN PROGRESS  LONG TERM GOALS: Target date: 07/26/2022    Patient will go up and down 8 steps with reciprocal gait without pain Baseline:  Goal status: INITIAL  2.  Patient will demonstrate deep squat without pain in order to pick up items off the ground Baseline:  Goal status: INITIAL  3.  Patient will demonstrate full range of motion in order to perform ADLs without increased pain Baseline:  Goal status: INITIAL  4.  Patient will be independent with full appropriate gym program Baseline:  Goal status: INITIAL  PLAN:  PT FREQUENCY: 2x/week  PT DURATION: 8 weeks  PLANNED INTERVENTIONS: Therapeutic exercises, Therapeutic  activity, Neuromuscular re-education, Balance  training, Gait training, Patient/Family education, Self Care, Joint mobilization, Stair training, DME instructions, Aquatic Therapy, Cryotherapy, Moist heat, Taping, and Manual therapy  PLAN FOR NEXT SESSION: Passive range of motion for flexion and emphasis on extension.  Consider NuStep, start balance training,  begin stair training and squat training when able.   07/27/22 11:06 AM

## 2022-07-31 ENCOUNTER — Other Ambulatory Visit: Payer: Self-pay | Admitting: Adult Health

## 2022-07-31 DIAGNOSIS — R29898 Other symptoms and signs involving the musculoskeletal system: Secondary | ICD-10-CM

## 2022-07-31 DIAGNOSIS — M48061 Spinal stenosis, lumbar region without neurogenic claudication: Secondary | ICD-10-CM

## 2022-08-08 ENCOUNTER — Ambulatory Visit (HOSPITAL_BASED_OUTPATIENT_CLINIC_OR_DEPARTMENT_OTHER): Payer: Medicare Other | Attending: Orthopaedic Surgery | Admitting: Physical Therapy

## 2022-08-08 DIAGNOSIS — M25661 Stiffness of right knee, not elsewhere classified: Secondary | ICD-10-CM | POA: Diagnosis not present

## 2022-08-08 DIAGNOSIS — R2689 Other abnormalities of gait and mobility: Secondary | ICD-10-CM | POA: Diagnosis present

## 2022-08-08 DIAGNOSIS — M25561 Pain in right knee: Secondary | ICD-10-CM | POA: Insufficient documentation

## 2022-08-08 DIAGNOSIS — R600 Localized edema: Secondary | ICD-10-CM | POA: Diagnosis present

## 2022-08-08 NOTE — Therapy (Signed)
OUTPATIENT PHYSICAL THERAPY LOWER EXTREMITY Treatment / Progress note    Patient Name: Diane Giles MRN: [id number] DOB:07/05/1955, 67 y.o., female Today's Date: 08/08/2022  END OF SESSION:  PT End of Session - 08/08/22 0807     Visit Number 14    Number of Visits 16    Date for PT Re-Evaluation 08/09/22    PT Start Time 0803    PT Stop Time 0844    PT Time Calculation (min) 41 min    Activity Tolerance Patient tolerated treatment well    Behavior During Therapy [facility] for tasks assessed/performed               Progress Note Reporting Period 06/13/2022 to 07/13/2022  See note below for Objective Data and Assessment of Progress/Goals.      Past Medical History:  Diagnosis Date   Aortic stenosis    mild-moderate AS 04/22/19 echo   Aortoiliac occlusive disease (HCC) 12/03/2016   Arthritis    Diabetes mellitus without complication (HCC)    per pt she is pre- diabetic   Diverticulitis    Gout    Heart murmur    Hyperlipidemia    Hypertension    Osteoporosis    Sleep apnea    Tobacco use disorder 07/04/2012   Past Surgical History:  Procedure Laterality Date   ABDOMINAL AORTOGRAM W/LOWER EXTREMITY N/A 12/03/2016   Procedure: Abdominal Aortogram w/Lower Extremity;  Surgeon: Chuck Hint, MD;  Location: [facility] INVASIVE CV LAB;  Service: Cardiovascular;  Laterality: N/A;   AORTA -INNOMIATE BYPASS N/A 05/18/2019   Procedure: AORTA -INNOMIATE BYPASS Using Hemashield Gold Graft Size 8mm;  Surgeon: Alleen Borne, MD;  Location: [facility] OR;  Service: Open Heart Surgery;  Laterality: N/A;   aorto- fem bypass Bilateral    aortobiiliac bypass in 2000   DILATATION & CURETTAGE/HYSTEROSCOPY WITH MYOSURE N/A 12/27/2015   Procedure: DILATATION & CURETTAGE/HYSTEROSCOPY;  Surgeon: Patton Salles, MD;  Location: [facility] ORS;  Service: Gynecology;  Laterality: N/A;   DILATION AND CURETTAGE OF UTERUS  11/2015   ELBOW SURGERY Left ~2007   tendon repair by Dr. Ranell Patrick   EYE  SURGERY Bilateral 2008   lasik   JOINT REPLACEMENT     lower aortic bypass  2000   per patient   PERIPHERAL VASCULAR INTERVENTION  12/03/2016   Stenting of anastomotic stenosis of the aortoiliac and right common iliac artery with 7 x 39 mm VBX covered stent and postdilated with 9 mm balloon.   Right innonimate bypass  05/18/2019   Aorto innominate bypass surgery with median sternotomy   STERNOTOMY  05/18/2019   Right aorto-innonimate bypass surgery 05/18/19 Dr. Laneta Simmers   TOTAL HIP ARTHROPLASTY Right 07/27/2014   Procedure: RIGHT TOTAL HIP ARTHROPLASTY ANTERIOR APPROACH;  Surgeon: Durene Romans, MD;  Location: [facility] ORS;  Service: Orthopedics;  Laterality: Right;   TOTAL KNEE ARTHROPLASTY Right 05/28/2022   Procedure: RIGHT TOTAL KNEE ARTHROPLASTY;  Surgeon: Tarry Kos, MD;  Location: [facility] OR;  Service: Orthopedics;  Laterality: Right;   Patient Active Problem List   Diagnosis Date Noted   Primary osteoarthritis of left knee 07/10/2022   Status post total right knee replacement 05/28/2022   Vitamin B12 deficiency 10/31/2021   Small bowel obstruction    Partial small bowel obstruction 10/28/2021   Primary osteoarthritis of right knee 06/20/2021   PAD (peripheral artery disease)    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   Aortoiliac occlusive disease 12/03/2016   Centrilobular emphysema 10/03/2016  Right groin pain 02/27/2016   Sacroiliitis 02/27/2016   Postmenopausal bleeding 12/01/2015   Psoas tendinitis of right side 10/21/2015   Endometrial thickening on ultra sound 07/28/2015   S/P right THA, 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   Type 2 diabetes mellitus 05/18/2014   DDD (degenerative disc disease) 03/18/2013   Metabolic syndrome 07/29/2012   Essential hypertension, benign 07/18/2012   Hyperlipidemia 07/18/2012   Obstructive sleep apnea 07/04/2012   Osteoporosis 07/04/2012   Morbid obesity 07/04/2012    PCP: Lula Olszewski MD   REFERRING PROVIDER: Neomia Dear PA    REFERRING DIAG: Right TKA  THERAPY DIAG:  Stiffness of right knee, not elsewhere classified  Acute pain of right knee  Other abnormalities of gait and mobility  Rationale for Evaluation and Treatment: Rehabilitation  ONSET DATE: 05/28/2022 Days since surgery: 72   SUBJECTIVE:   SUBJECTIVE STATEMENT:  Patient is feeling pretty good. Patient reports she has been going to the gym but has not been keeping up with her HEP. Patient has also been going on walks. Overall, patient is not having any pain.   PERTINENT HISTORY: Aortic stenosis; Pre-diabetic,  Gout ( hasn't had an attack in 1.5 years) Osteopetrosis,  right hip replacement; Left ebow surgery 2007;  PAIN:  Are you having pain? No: NPRS scale: 0/10 Pain location: right knee  Pain description: aching  Aggravating factors: night time;  Relieving factors: rest   PRECAUTIONS: None  WEIGHT BEARING RESTRICTIONS: No  FALLS:  Has patient fallen in last 6 months? No  LIVING ENVIRONMENT: 15  going to the second floor. 2 steps going in.   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     PLOF: Independent  PATIENT GOALS:   To get back to exercises   NEXT MD VISIT:   OBJECTIVE:   DIAGNOSTIC FINDINGS:   PATIENT SURVEYS:  FOTO    COGNITION: Overall cognitive status: Within functional limits for tasks assessed     SENSATION: WFL  EDEMA:  Circumferential: 43.5  MUSCLE LENGTH:  POSTURE: No Significant postural limitations  PALPATION: No unexpected tenderness to palpation   LOWER EXTREMITY ROM:  Passive ROM Right eval Left eval R 2/16 2/19 2/26 3/6  Hip flexion        Hip extension        Hip abduction        Hip adduction        Hip internal rotation        Hip external rotation        Knee flexion 95 100 102 110 110 115 after manual   Knee extension -5 -6 -5 -3 -3 0  Ankle dorsiflexion        Ankle plantarflexion        Ankle inversion        Ankle eversion         (Blank rows = not  tested)  LOWER EXTREMITY MMT:  MMT Right eval Left eval    Hip flexion 14.6 24.8 29.5 35.9  Hip extension      Hip abduction 23.5 32.8 33.4 32.6  Hip adduction      Hip internal rotation      Hip external rotation      Knee flexion      Knee extension  27.2 25.0 35.2  Ankle dorsiflexion      Ankle plantarflexion      Ankle inversion      Ankle eversion       (Blank rows = not tested)  FUNCTIONAL TESTS:  Mild use of hands for sit to stand      TODAY'S TREATMENT:   4/2 Nu step x75mins L5 Manual: PROM knee flexion; Grade II and III PA ad AP mobilization; trigger points to the quad    SLR 3x10 1.5lbs Prone HS curl 1.5lbs 3x10 LAQ  3lbs 2x10 2s   Step downs 4 inch step 2x10  Lateral step ups     3/22 Nu-step 5 min L4  SLR 3x10 1.5lbs Prone HS curl 1.5lbs 3x10 LAQ  3lbs 2x10 2s   Step up 8 inch 2x10  Lateral step 6 inch  2x10   Step onto air-ex x20 fwd x20 lateral   Leg press 3x15   3/20  Bike: 6 min (full revolutions) seat #4; Lvl 1 117 flexion; 0 deg ext following mobilization  Patella mobilizations- grade III in all directions  R knee TKE joint mob grade III  GTB TKE 3x10 8" box step up 3x8 (with UE)  SLR 3x10 1.5lbs Prone HS curl 1.5lbs 3x10 LAQ  3lbs 2x10 2s  STS from high table (bothers L) Shuttle leg press staggered (R low) 3x8 75lbs    3/15 Patella mobilizations- grade III in all directions  Nu-ste Level 5 6 min  SLR 3x10 1.5 lbs  LAQ 1.5 lbs 3x10   Cybex leg press 70 lbs 2x15   Step down 4 inch x20  Step up 6 inch x20   Hip abduction machine 3x10 70 lbs      PATIENT EDUCATION:  Education details: HEP, symptom management  Person educated: Patient Education method: Explanation, Demonstration, Tactile cues, Verbal cues, and Handouts Education comprehension: verbalized understanding, returned demonstration, verbal cues required, tactile cues required, and needs further education  HOME EXERCISE PROGRAM: Access Code:  [email or web address] URL: [email or web address] Date: 06/13/2022 Prepared by: Lorayne Bender  Exercises - Supine Knee Extension Stretch on Towel Roll  - 2 x daily - 7 x weekly - 3 sets - 5 reps - 10 sec hold hold - Supine Heel Slide with Strap  - 2 x daily - 7 x weekly - 3 sets - 10 reps - 5 sec  hold - Supine Quad Set  - 2 x daily - 7 x weekly - 3 sets - 10 reps - Active Straight Leg Raise with Quad Set  - 1 x daily - 7 x weekly - 3 sets - 10 reps  ASSESSMENT:  CLINICAL IMPRESSION: The patient had mild baseline stiffness coming into the clinic today. After manual therapy she had significantly improved motion. She reports the most difficulty with step downs. We worekd on step downs on a 4 inch step today. She had pain to start but her pain improved with repetition. Therapy will continue to progress as tolerated.  OBJECTIVE IMPAIRMENTS: Abnormal gait, decreased activity tolerance, decreased mobility, difficulty walking, decreased ROM, decreased strength, increased edema, and pain.   ACTIVITY LIMITATIONS: carrying, lifting, bending, sitting, standing, squatting, sleeping, stairs, transfers, and locomotion level  PARTICIPATION LIMITATIONS: meal prep, cleaning, laundry, driving, shopping, community activity, occupation, and yard work  PERSONAL FACTORS: 1-2 comorbidities: right hip replacement  are also affecting patient's functional outcome.   REHAB POTENTIAL: Excellent  CLINICAL DECISION MAKING: Stable/uncomplicated  EVALUATION COMPLEXITY: Low   GOALS: Goals reviewed with patient? Yes  SHORT TERM GOALS: Target date: 07/12/2022   Patient will increase right knee flexion to 110 degrees Baseline: Goal status: INITIAL  2.  Patient will demonstrate full passive right knee extension Baseline:  Goal status: IN PROGRESS  3.  Patient will be independent with ambulation of a distance of 500 feet without a single-point cane Baseline:  Goal status: INITIAL  4.  Patient will be  independent with basic exercise program Baseline:  Goal status: IN PROGRESS  LONG TERM GOALS: Target date: 07/26/2022    Patient will go up and down 8 steps with reciprocal gait without pain Baseline:  Goal status: INITIAL  2.  Patient will demonstrate deep squat without pain in order to pick up items off the ground Baseline:  Goal status: INITIAL  3.  Patient will demonstrate full range of motion in order to perform ADLs without increased pain Baseline:  Goal status: INITIAL  4.  Patient will be independent with full appropriate gym program Baseline:  Goal status: INITIAL  PLAN:  PT FREQUENCY: 2x/week  PT DURATION: 8 weeks  PLANNED INTERVENTIONS: Therapeutic exercises, Therapeutic activity, Neuromuscular re-education, Balance training, Gait training, Patient/Family education, Self Care, Joint mobilization, Stair training, DME instructions, Aquatic Therapy, Cryotherapy, Moist heat, Taping, and Manual therapy  PLAN FOR NEXT SESSION: Passive range of motion for flexion and emphasis on extension.  Consider NuStep, start balance training,  begin stair training and squat training when able. Re-assess next visit.   Lorayne Bender PT DPT  08/08/22 8:52 AM  Cristal Ford SPT  During this treatment session, the therapist was present, participating in and directing the treatment.

## 2022-08-15 ENCOUNTER — Ambulatory Visit (HOSPITAL_BASED_OUTPATIENT_CLINIC_OR_DEPARTMENT_OTHER): Payer: Medicare Other | Admitting: Physical Therapy

## 2022-08-15 ENCOUNTER — Encounter (HOSPITAL_BASED_OUTPATIENT_CLINIC_OR_DEPARTMENT_OTHER): Payer: Self-pay | Admitting: Physical Therapy

## 2022-08-15 DIAGNOSIS — M25661 Stiffness of right knee, not elsewhere classified: Secondary | ICD-10-CM

## 2022-08-15 DIAGNOSIS — R2689 Other abnormalities of gait and mobility: Secondary | ICD-10-CM

## 2022-08-15 DIAGNOSIS — M25561 Pain in right knee: Secondary | ICD-10-CM

## 2022-08-15 DIAGNOSIS — R600 Localized edema: Secondary | ICD-10-CM

## 2022-08-15 NOTE — Therapy (Signed)
OUTPATIENT PHYSICAL THERAPY LOWER EXTREMITY Treatment / Re-Certification    Patient Name: Diane Giles MRN: [id number] DOB:30-Sep-1955, 67 y.o., female Today's Date: 08/15/2022  END OF SESSION:  PT End of Session - 08/15/22 1524     Visit Number 15    Number of Visits 18    Date for PT Re-Evaluation 11/13/22    PT Start Time 1445    PT Stop Time 1524    PT Time Calculation (min) 39 min    Activity Tolerance Patient tolerated treatment well    Behavior During Therapy [facility] for tasks assessed/performed                 Past Medical History:  Diagnosis Date   Aortic stenosis    mild-moderate AS 04/22/19 echo   Aortoiliac occlusive disease 12/03/2016   Arthritis    Diabetes mellitus without complication    per pt she is pre- diabetic   Diverticulitis    Gout    Heart murmur    Hyperlipidemia    Hypertension    Osteoporosis    Sleep apnea    Tobacco use disorder 07/04/2012   Past Surgical History:  Procedure Laterality Date   ABDOMINAL AORTOGRAM W/LOWER EXTREMITY N/A 12/03/2016   Procedure: Abdominal Aortogram w/Lower Extremity;  Surgeon: Chuck Hint, MD;  Location: [facility] INVASIVE CV LAB;  Service: Cardiovascular;  Laterality: N/A;   AORTA -INNOMIATE BYPASS N/A 05/18/2019   Procedure: AORTA -INNOMIATE BYPASS Using Hemashield Gold Graft Size 8mm;  Surgeon: Alleen Borne, MD;  Location: [facility] OR;  Service: Open Heart Surgery;  Laterality: N/A;   aorto- fem bypass Bilateral    aortobiiliac bypass in 2000   DILATATION & CURETTAGE/HYSTEROSCOPY WITH MYOSURE N/A 12/27/2015   Procedure: DILATATION & CURETTAGE/HYSTEROSCOPY;  Surgeon: Patton Salles, MD;  Location: [facility] ORS;  Service: Gynecology;  Laterality: N/A;   DILATION AND CURETTAGE OF UTERUS  11/2015   ELBOW SURGERY Left ~2007   tendon repair by Dr. Ranell Patrick   EYE SURGERY Bilateral 2008   lasik   JOINT REPLACEMENT     lower aortic bypass  2000   per patient   PERIPHERAL VASCULAR INTERVENTION   12/03/2016   Stenting of anastomotic stenosis of the aortoiliac and right common iliac artery with 7 x 39 mm VBX covered stent and postdilated with 9 mm balloon.   Right innonimate bypass  05/18/2019   Aorto innominate bypass surgery with median sternotomy   STERNOTOMY  05/18/2019   Right aorto-innonimate bypass surgery 05/18/19 Dr. Laneta Simmers   TOTAL HIP ARTHROPLASTY Right 07/27/2014   Procedure: RIGHT TOTAL HIP ARTHROPLASTY ANTERIOR APPROACH;  Surgeon: Durene Romans, MD;  Location: [facility] ORS;  Service: Orthopedics;  Laterality: Right;   TOTAL KNEE ARTHROPLASTY Right 05/28/2022   Procedure: RIGHT TOTAL KNEE ARTHROPLASTY;  Surgeon: Tarry Kos, MD;  Location: [facility] OR;  Service: Orthopedics;  Laterality: Right;   Patient Active Problem List   Diagnosis Date Noted   Primary osteoarthritis of left knee 07/10/2022   Status post total right knee replacement 05/28/2022   Vitamin B12 deficiency 10/31/2021   Small bowel obstruction    Partial small bowel obstruction 10/28/2021   Primary osteoarthritis of right knee 06/20/2021   PAD (peripheral artery disease)    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   Aortoiliac occlusive disease 12/03/2016   Centrilobular emphysema 10/03/2016   Right groin pain 02/27/2016   Sacroiliitis 02/27/2016   Postmenopausal bleeding 12/01/2015   Psoas tendinitis of right side 10/21/2015  Endometrial thickening on ultra sound 07/28/2015   S/P right THA, 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   Type 2 diabetes mellitus 05/18/2014   DDD (degenerative disc disease) 03/18/2013   Metabolic syndrome 07/29/2012   Essential hypertension, benign 07/18/2012   Hyperlipidemia 07/18/2012   Obstructive sleep apnea 07/04/2012   Osteoporosis 07/04/2012   Morbid obesity 07/04/2012    PCP: Lula Olszewski MD   REFERRING PROVIDER: Neomia Dear PA   REFERRING DIAG: Right TKA  THERAPY DIAG:  Stiffness of right knee, not elsewhere classified  Acute pain of right knee  Other  abnormalities of gait and mobility  Localized edema  Rationale for Evaluation and Treatment: Rehabilitation  ONSET DATE: 05/28/2022 Days since surgery: 79   SUBJECTIVE:   SUBJECTIVE STATEMENT:  Pt states that going downstairs has improved greatly in the last few sessions. The knee just continues to stiffen up on her but no complaints otherwise  PERTINENT HISTORY: Aortic stenosis; Pre-diabetic,  Gout ( hasn't had an attack in 1.5 years) Osteopetrosis,  right hip replacement; Left ebow surgery 2007;  PAIN:  Are you having pain? No: NPRS scale: 0/10 Pain location: right knee  Pain description: aching  Aggravating factors: night time;  Relieving factors: rest   PRECAUTIONS: None  WEIGHT BEARING RESTRICTIONS: No  FALLS:  Has patient fallen in last 6 months? No  LIVING ENVIRONMENT: 15  going to the second floor. 2 steps going in.   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     PLOF: Independent  PATIENT GOALS:   To get back to exercises   NEXT MD VISIT:   OBJECTIVE:   DIAGNOSTIC FINDINGS:   PATIENT SURVEYS:  FOTO 81  4/10 Exceeded expectations  MUSCLE LENGTH:  POSTURE: No Significant postural limitations  PALPATION: No unexpected tenderness to palpation   LOWER EXTREMITY ROM:  Passive ROM Right eval Left eval R 2/16 2/19 2/26 3/6 4/10  Hip flexion         Hip extension         Hip abduction         Hip adduction         Hip internal rotation         Hip external rotation         Knee flexion 95 100 102 110 110 115 after manual  114  Knee extension -5 -6 -5 -3 -3 0 0  Ankle dorsiflexion         Ankle plantarflexion         Ankle inversion         Ankle eversion          (Blank rows = not tested)  LOWER EXTREMITY MMT:  MMT Right eval Left eval    Hip flexion 14.6 24.8 29.5 35.9  Hip extension      Hip abduction 23.5 32.8 33.4 32.6  Hip adduction      Hip internal rotation      Hip external rotation      Knee flexion      Knee  extension  27.2 25.0 35.2  Ankle dorsiflexion      Ankle plantarflexion      Ankle inversion      Ankle eversion       (Blank rows = not tested)    FUNCTIONAL TESTS:  STS without UE at this time   TODAY'S TREATMENT:  4/10  Exercises - Gastroc Stretch on Wall   3 reps - 30 hold - Standing Knee Flexion Stretch on Step  10 reps - 5 hold - Seated Knee Extension Stretch with Chair  1 sets - 1 reps - 2-3 min hold - Heel Raises with Counter Support  3 sets - 10 reps - Blue band standing Terminal Knee Extension with Resistance   3 sets - 10 reps - Hamstring Curl with Weight Machine 25 lbs  3 sets - 10 reps - Knee Extension with Weight Machine (unable to do with L) 3 sets - 10 reps - Lateral Step Down  2 sets - 10 reps 8" step with UE  4/2 Nu step x34mins L5 Manual: PROM knee flexion; Grade II and III PA ad AP mobilization; trigger points to the quad    SLR 3x10 1.5lbs Prone HS curl 1.5lbs 3x10 LAQ  3lbs 2x10 2s   Step downs 4 inch step 2x10  Lateral step ups     3/22 Nu-step 5 min L4  SLR 3x10 1.5lbs Prone HS curl 1.5lbs 3x10 LAQ  3lbs 2x10 2s   Step up 8 inch 2x10  Lateral step 6 inch  2x10   Step onto air-ex x20 fwd x20 lateral   Leg press 3x15   3/20  Bike: 6 min (full revolutions) seat #4; Lvl 1 117 flexion; 0 deg ext following mobilization  Patella mobilizations- grade III in all directions  R knee TKE joint mob grade III  GTB TKE 3x10 8" box step up 3x8 (with UE)  SLR 3x10 1.5lbs Prone HS curl 1.5lbs 3x10 LAQ  3lbs 2x10 2s  STS from high table (bothers L) Shuttle leg press staggered (R low) 3x8 75lbs    3/15 Patella mobilizations- grade III in all directions  Nu-ste Level 5 6 min  SLR 3x10 1.5 lbs  LAQ 1.5 lbs 3x10   Cybex leg press 70 lbs 2x15   Step down 4 inch x20  Step up 6 inch x20   Hip abduction machine 3x10 70 lbs      PATIENT EDUCATION:  Education details: HEP, symptom management  Person educated: Patient Education  method: Explanation, Demonstration, Tactile cues, Verbal cues, and Handouts Education comprehension: verbalized understanding, returned demonstration, verbal cues required, tactile cues required, and needs further education  HOME EXERCISE PROGRAM: Access Code: [email or web address] URL: [email or web address] Date: 06/13/2022 Prepared by: Lorayne Bender  Exercises - Supine Knee Extension Stretch on Towel Roll  - 2 x daily - 7 x weekly - 3 sets - 5 reps - 10 sec hold hold - Supine Heel Slide with Strap  - 2 x daily - 7 x weekly - 3 sets - 10 reps - 5 sec  hold - Supine Quad Set  - 2 x daily - 7 x weekly - 3 sets - 10 reps - Active Straight Leg Raise with Quad Set  - 1 x daily - 7 x weekly - 3 sets - 10 reps  ASSESSMENT:  CLINICAL IMPRESSION: Pt has improved greatly with baseline ROM but continues to have joint stiffness and weakness of the R LE that continues to limit full functional mobility. Strength has improved and pt has enough flexion to comfortably navigate stairs. However, still unable to perform reciprcoally with UE support. Plan to continue with therapy at a decreased frequency with focus at future visits on eccentric R quad strength and stability. Pt's L knee OA and pain effects ability to perform DL exercise. Pt would benefit from continued skilled therapy in order to reach goals and maximize functional R LE strength and ROM for full return to PLOF.    OBJECTIVE IMPAIRMENTS:  Abnormal gait, decreased activity tolerance, decreased mobility, difficulty walking, decreased ROM, decreased strength, increased edema, and pain.   ACTIVITY LIMITATIONS: carrying, lifting, bending, sitting, standing, squatting, sleeping, stairs, transfers, and locomotion level  PARTICIPATION LIMITATIONS: meal prep, cleaning, laundry, driving, shopping, community activity, occupation, and yard work  PERSONAL FACTORS: 1-2 comorbidities: right hip replacement  are also affecting patient's functional outcome.    REHAB POTENTIAL: Excellent  CLINICAL DECISION MAKING: Stable/uncomplicated  EVALUATION COMPLEXITY: Low   GOALS: Goals reviewed with patient? Yes  SHORT TERM GOALS: Target date: 07/12/2022   Patient will increase right knee flexion to 110 degrees Baseline: Goal status: met  2.  Patient will demonstrate full passive right knee extension Baseline:  Goal status: met  3.  Patient will be independent with ambulation of a distance of 500 feet without a single-point cane Baseline:  Goal status: met  4.  Patient will be independent with basic exercise program Baseline:  Goal status: MET  LONG TERM GOALS: Target date:. 11/07/2022     Patient will go up and down 8 steps with reciprocal gait without pain Baseline:  Goal status: ongoing  2.  Patient will demonstrate deep squat without pain in order to pick up items off the ground Baseline:  Goal status: ongoing  3.  Patient will demonstrate full range of motion in order to perform ADLs without increased pain Baseline:  Goal status: met  4.  Patient will be independent with full appropriate gym program Baseline:  Goal status: ongoing  PLAN:  PT FREQUENCY: 1x/month  PT DURATION: 3 months  PLANNED INTERVENTIONS: Therapeutic exercises, Therapeutic activity, Neuromuscular re-education, Balance training, Gait training, Patient/Family education, Self Care, Joint mobilization, Stair training, DME instructions, Aquatic Therapy, Cryotherapy, Moist heat, Taping, and Manual therapy  PLAN FOR NEXT SESSION: Progressive knee ext and flexion, quad and HS exercise/loading, SL stability  Zebedee Iba PT, DPT 08/15/22 3:33 PM

## 2022-08-18 IMAGING — CT CT CHEST LUNG CANCER SCREENING LOW DOSE W/O CM
2 of 5 series · 15 of 40 positions shown, 18 images · non-contrast
Comparison: 08/31/2020

CLINICAL DATA: 58-year-old female with 41 pack-year history of
smoking. Lung cancer screening.



[Series 4: lung 1.00 br44 cor · coronal · 0.63mm/px · 3 of 283 slices shown]
[im 57/283  lung]
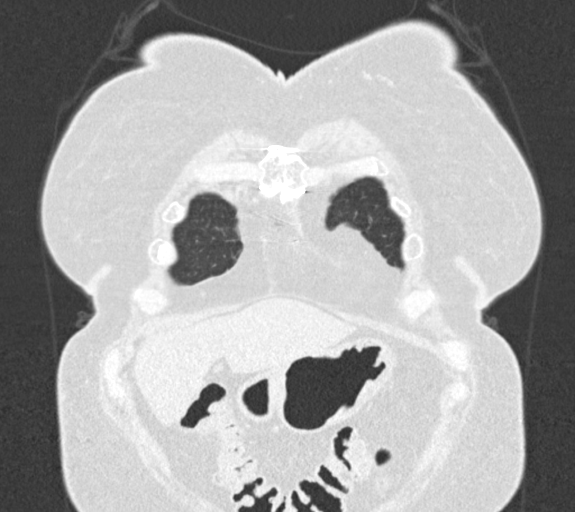
[im 113/283  lung]
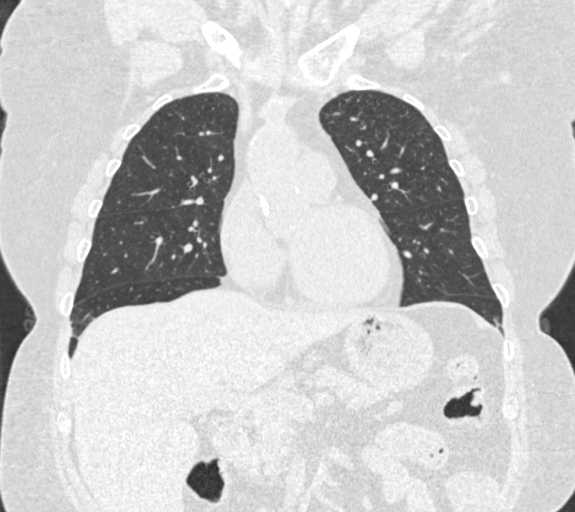
[im 170/283  lung]
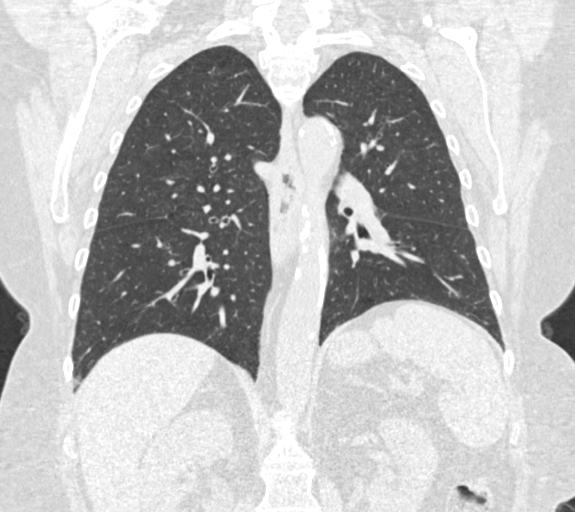

[Series 9: lung 1.00 br60 axial · axial · 0.71mm/px · z∈[-1085,-794]mm · 12 of 321 slices shown, 15 images]
[im 15/321  mediastinal]
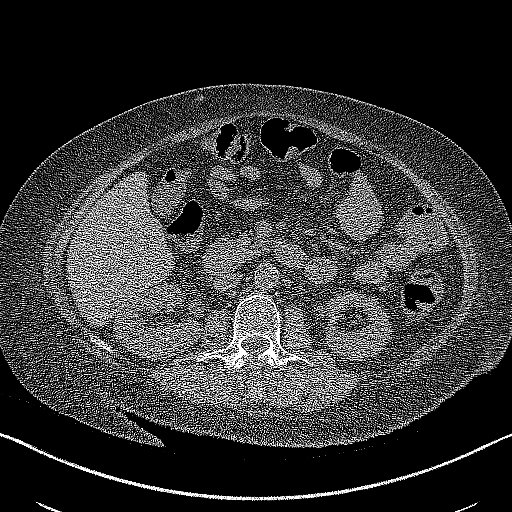
[im 15/321  lung]
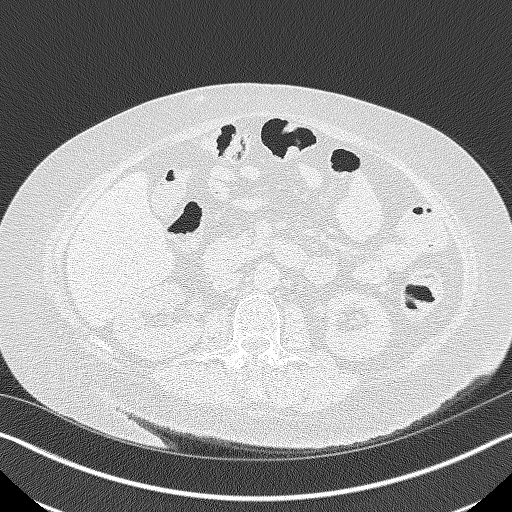
[im 44/321  lung]
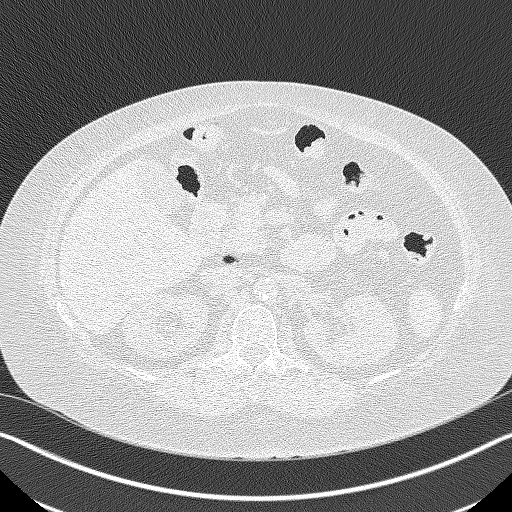
[im 73/321  lung]
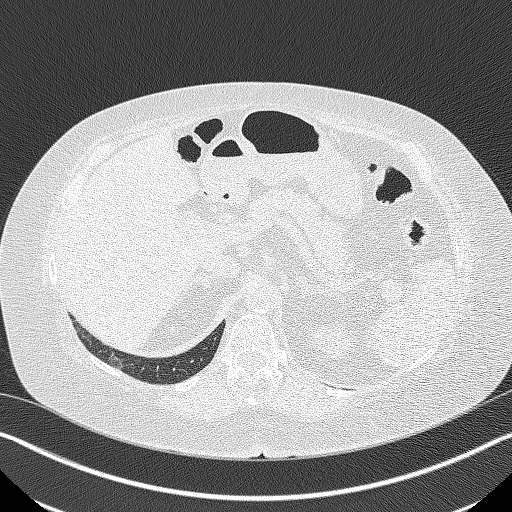
[im 102/321  lung]
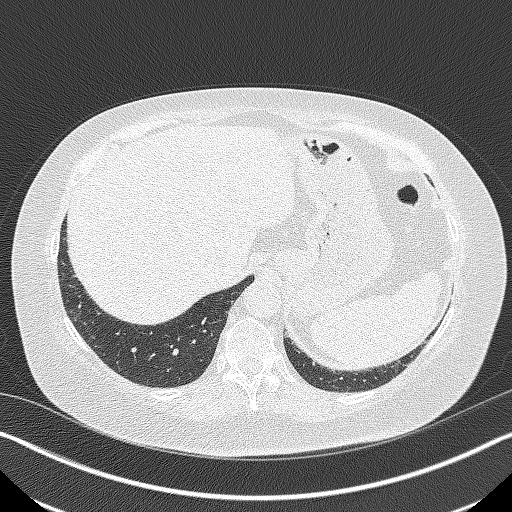
[im 117/321  mediastinal]
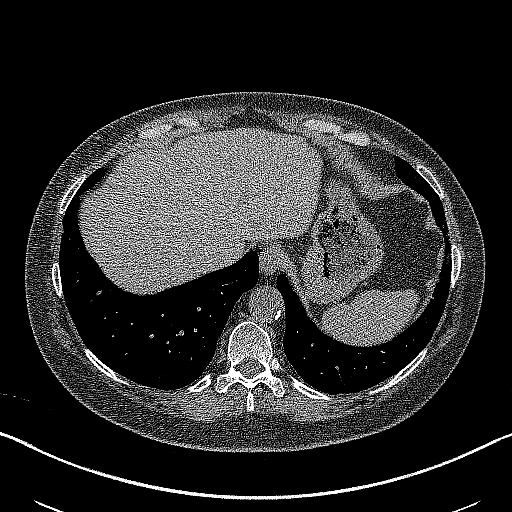
[im 117/321  lung]
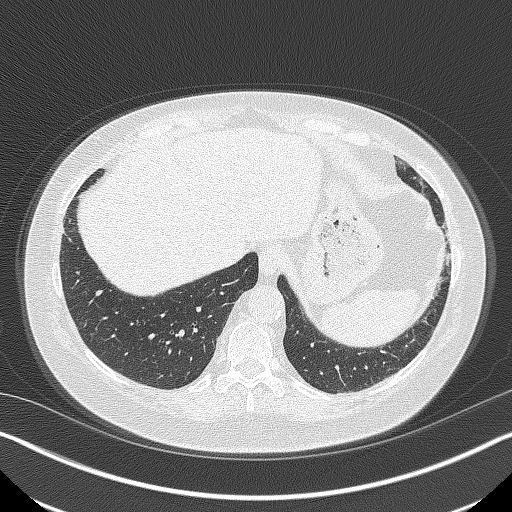
[im 146/321  lung]
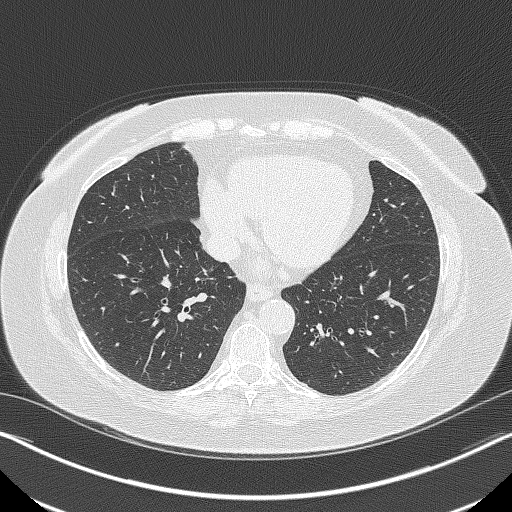
[im 175/321  lung]
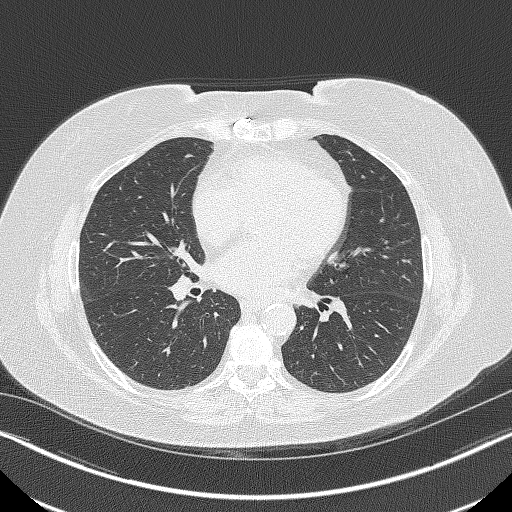
[im 204/321  lung]
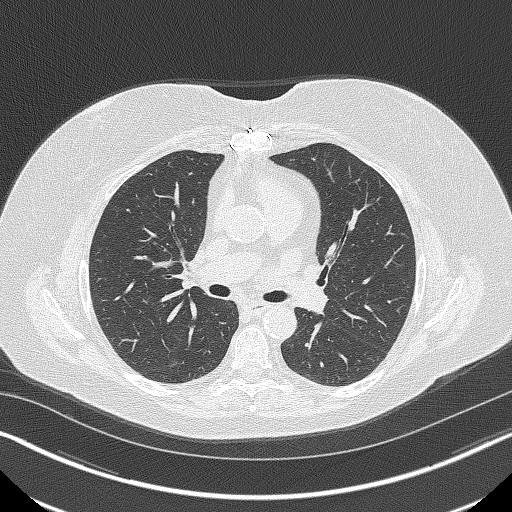
[im 219/321  mediastinal]
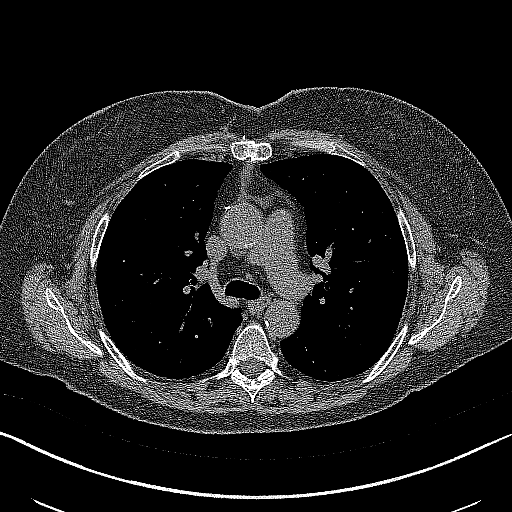
[im 219/321  lung]
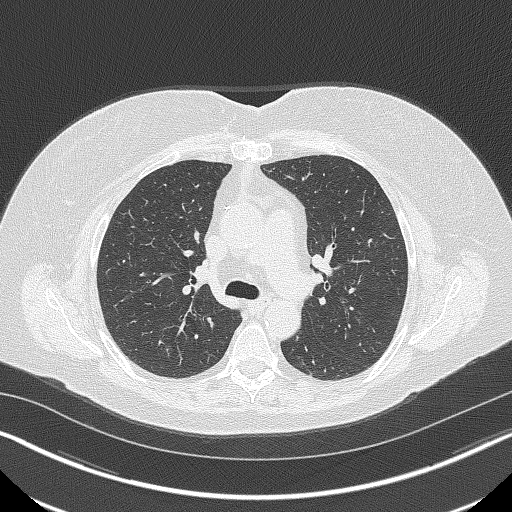
[im 248/321  lung]
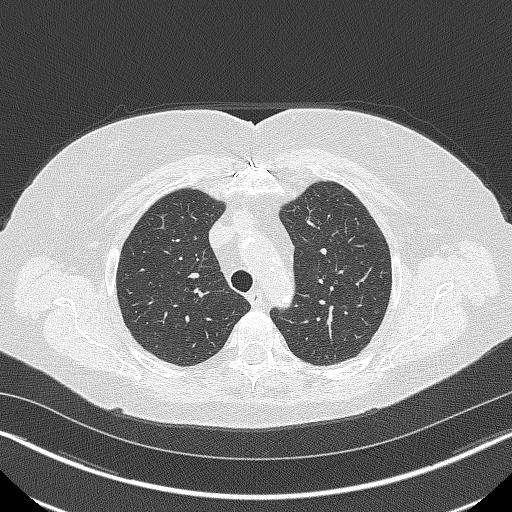
[im 277/321  lung]
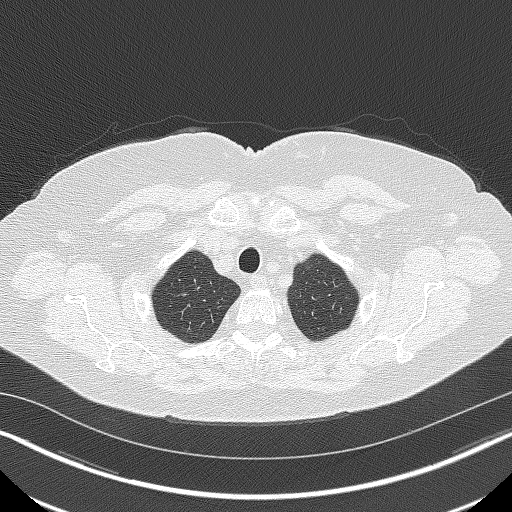
[im 306/321  lung]
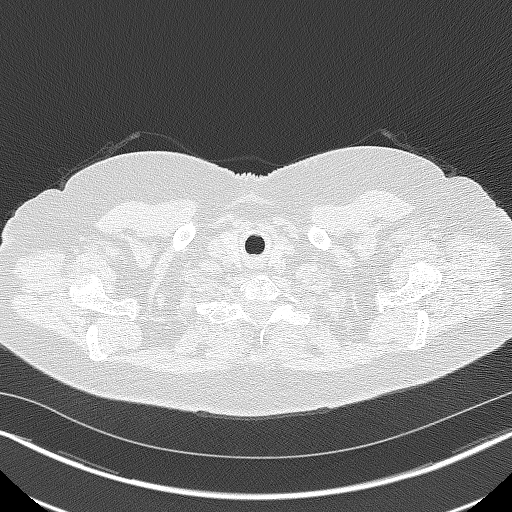

[15 of 40 positions shown; findings below may reference images not displayed]

FINDINGS: Cardiovascular: The heart size is normal. No substantial pericardial
effusion. Coronary artery calcification is evident. Moderate
atherosclerotic calcification is noted in the wall of the thoracic
aorta.

Mediastinum/Nodes: No mediastinal lymphadenopathy. No evidence for
gross hilar lymphadenopathy although assessment is limited by the
lack of intravenous contrast on the current study. The esophagus has
normal imaging features. There is no axillary lymphadenopathy.

Lungs/Pleura: Centrilobular emphsyema noted. No suspicious pulmonary
nodule or mass. No focal airspace consolidation. No pleural
effusion.

Upper Abdomen: Unremarkable.

Musculoskeletal: No worrisome lytic or sclerotic osseous
abnormality.
IMPRESSION: 1. Lung-RADS 1, negative. Continue annual screening with low-dose
chest CT without contrast in 12 months.
2. Aortic Atherosclerosis (C9R46-VGP.P) and Emphysema (C9R46-R2O.A).

## 2022-08-21 ENCOUNTER — Ambulatory Visit (INDEPENDENT_AMBULATORY_CARE_PROVIDER_SITE_OTHER): Payer: Medicare Other | Admitting: Orthopaedic Surgery

## 2022-08-21 DIAGNOSIS — Z96651 Presence of right artificial knee joint: Secondary | ICD-10-CM

## 2022-08-21 NOTE — Progress Notes (Signed)
Post-Op Visit Note   Patient: Diane Giles           Date of Birth: 1956/01/30           MRN: [id number] Visit Date: 08/21/2022 PCP: Lula Olszewski, MD   Assessment & Plan:  Chief Complaint:  Chief Complaint  Patient presents with   Right Knee - Routine Post Op   Visit Diagnoses:  1. Status post total right knee replacement     Plan: Patient is now 3 months status post right total knee replacement.  She is doing well overall and has no complaints.  She is scheduled to complete physical therapy tomorrow.  Examination of the right knee shows fully healed surgical scar.  Good varus valgus stability.  Excellent functional range of motion.  Patient is now 3 months postop.  She is doing very well.  She has no complaints.  She will follow-up in July for 79-month visit for the right knee and repeat x-rays of the left knee to plan for left knee replacement in October.  Follow-Up Instructions: Return in about 3 months (around 11/20/2022).   Orders:  No orders of the defined types were placed in this encounter.  No orders of the defined types were placed in this encounter.   Imaging: No results found.  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   Aortoiliac occlusive disease 12/03/2016   Centrilobular emphysema 10/03/2016   Right groin pain 02/27/2016   Sacroiliitis 02/27/2016   Postmenopausal bleeding 12/01/2015   Psoas tendinitis of right side 10/21/2015   Endometrial thickening on ultra sound 07/28/2015   S/P right THA, 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   Type 2 diabetes mellitus 05/18/2014   DDD (degenerative  disc disease) 03/18/2013   Metabolic syndrome 07/29/2012   Essential hypertension, benign 07/18/2012   Hyperlipidemia 07/18/2012   Obstructive sleep apnea 07/04/2012   Osteoporosis 07/04/2012   Morbid obesity 07/04/2012   Past Medical History:  Diagnosis Date   Aortic stenosis    mild-moderate AS 04/22/19 echo   Aortoiliac occlusive disease 12/03/2016   Arthritis    Diabetes mellitus without complication    per pt she is pre- diabetic   Diverticulitis    Gout    Heart murmur    Hyperlipidemia    Hypertension    Osteoporosis    Sleep apnea    Tobacco use disorder 07/04/2012    Family History  Problem Relation Age of Onset   Migraines Mother    Thyroid disease Mother        hypothyroid   Cancer Father 29       Dec age 76 with pancreatic Ca    Past Surgical History:  Procedure Laterality Date   ABDOMINAL AORTOGRAM W/LOWER EXTREMITY N/A 12/03/2016   Procedure: Abdominal Aortogram w/Lower Extremity;  Surgeon: Chuck Hint, MD;  Location: [facility] INVASIVE CV LAB;  Service: Cardiovascular;  Laterality: N/A;   AORTA -INNOMIATE BYPASS N/A 05/18/2019   Procedure: AORTA -INNOMIATE BYPASS Using Hemashield Gold Graft Size 8mm;  Surgeon: Alleen Borne, MD;  Location: [facility] OR;  Service: Open Heart Surgery;  Laterality: N/A;   aorto- fem bypass  Bilateral    aortobiiliac bypass in 2000   DILATATION & CURETTAGE/HYSTEROSCOPY WITH MYOSURE N/A 12/27/2015   Procedure: DILATATION & CURETTAGE/HYSTEROSCOPY;  Surgeon: Patton Salles, MD;  Location: [facility] ORS;  Service: Gynecology;  Laterality: N/A;   DILATION AND CURETTAGE OF UTERUS  11/2015   ELBOW SURGERY Left ~2007   tendon repair by Dr. Ranell Patrick   EYE SURGERY Bilateral 2008   lasik   JOINT REPLACEMENT     lower aortic bypass  2000   per patient   PERIPHERAL VASCULAR INTERVENTION  12/03/2016   Stenting of anastomotic stenosis of the aortoiliac and right common iliac artery with 7 x 39 mm VBX covered stent and postdilated with 9  mm balloon.   Right innonimate bypass  05/18/2019   Aorto innominate bypass surgery with median sternotomy   STERNOTOMY  05/18/2019   Right aorto-innonimate bypass surgery 05/18/19 Dr. Laneta Simmers   TOTAL HIP ARTHROPLASTY Right 07/27/2014   Procedure: RIGHT TOTAL HIP ARTHROPLASTY ANTERIOR APPROACH;  Surgeon: Durene Romans, MD;  Location: [facility] ORS;  Service: Orthopedics;  Laterality: Right;   TOTAL KNEE ARTHROPLASTY Right 05/28/2022   Procedure: RIGHT TOTAL KNEE ARTHROPLASTY;  Surgeon: Tarry Kos, MD;  Location: [facility] OR;  Service: Orthopedics;  Laterality: Right;   Social History   Occupational History   Occupation: Tree surgeon  Tobacco Use   Smoking status: Former    Packs/day: 0.50    Years: 40.00    Additional pack years: 0.00    Total pack years: 20.00    Types: Cigarettes    Quit date: 02/23/2017    Years since quitting: 5.4   Smokeless tobacco: Never  Vaping Use   Vaping Use: Never used  Substance and Sexual Activity   Alcohol use: Yes    Comment: very rare--1/month   Drug use: No   Sexual activity: Yes    Partners: Male    Birth control/protection: Post-menopausal

## 2022-08-23 ENCOUNTER — Encounter (HOSPITAL_BASED_OUTPATIENT_CLINIC_OR_DEPARTMENT_OTHER): Payer: Medicare Other | Admitting: Physical Therapy

## 2022-08-25 ENCOUNTER — Ambulatory Visit
Admission: RE | Admit: 2022-08-25 | Discharge: 2022-08-25 | Disposition: A | Discharge status: Home / Self Care | Payer: Medicare Other | Source: Ambulatory Visit | Attending: Adult Health | Admitting: Adult Health

## 2022-08-25 DIAGNOSIS — R29898 Other symptoms and signs involving the musculoskeletal system: Secondary | ICD-10-CM

## 2022-08-25 DIAGNOSIS — M48061 Spinal stenosis, lumbar region without neurogenic claudication: Secondary | ICD-10-CM

## 2022-08-29 ENCOUNTER — Encounter (HOSPITAL_BASED_OUTPATIENT_CLINIC_OR_DEPARTMENT_OTHER): Payer: Medicare Other | Admitting: Physical Therapy

## 2022-09-03 ENCOUNTER — Ambulatory Visit (HOSPITAL_BASED_OUTPATIENT_CLINIC_OR_DEPARTMENT_OTHER): Payer: Medicare Other | Admitting: Physical Therapy

## 2022-09-03 ENCOUNTER — Encounter (HOSPITAL_BASED_OUTPATIENT_CLINIC_OR_DEPARTMENT_OTHER): Payer: Self-pay | Admitting: Physical Therapy

## 2022-09-03 DIAGNOSIS — R600 Localized edema: Secondary | ICD-10-CM

## 2022-09-03 DIAGNOSIS — M25661 Stiffness of right knee, not elsewhere classified: Secondary | ICD-10-CM | POA: Diagnosis not present

## 2022-09-03 DIAGNOSIS — R2689 Other abnormalities of gait and mobility: Secondary | ICD-10-CM

## 2022-09-03 DIAGNOSIS — M25561 Pain in right knee: Secondary | ICD-10-CM

## 2022-09-03 NOTE — Therapy (Signed)
OUTPATIENT PHYSICAL THERAPY LOWER EXTREMITY Treatment  PHYSICAL THERAPY DISCHARGE SUMMARY  Visits from Start of Care: 18  Plan: Patient agrees to discharge.  Patient goals were met. Patient is being discharged due to meeting the stated rehab goals.           Patient Name: Diane Giles MRN: [id number] DOB:[birth year], 67 y.o., female Today's Date: 09/03/2022  END OF SESSION:  PT End of Session - 09/03/22 1130     Visit Number 16    Number of Visits 18    Date for PT Re-Evaluation 11/13/22    PT Start Time 1102    PT Stop Time 1128    PT Time Calculation (min) 26 min    Activity Tolerance Patient tolerated treatment well    Behavior During Therapy [facility] for tasks assessed/performed                  Past Medical History:  Diagnosis Date   Aortic stenosis    mild-moderate AS 04/22/19 echo   Aortoiliac occlusive disease (HCC) 12/03/2016   Arthritis    Diabetes mellitus without complication (HCC)    per pt she is pre- diabetic   Diverticulitis    Gout    Heart murmur    Hyperlipidemia    Hypertension    Osteoporosis    Sleep apnea    Tobacco use disorder 07/04/2012   Past Surgical History:  Procedure Laterality Date   ABDOMINAL AORTOGRAM W/LOWER EXTREMITY N/A 12/03/2016   Procedure: Abdominal Aortogram w/Lower Extremity;  Surgeon: Chuck Hint, MD;  Location[facility] INVASIVE CV LAB;  Service: Cardiovascular;  Laterality: N/A;   AORTA -INNOMIATE BYPASS N/A 05/18/2019   Procedure: AORTA -INNOMIATE BYPASS Using Hemashield Gold Graft Size 8mm;  Surgeon: Alleen Borne, MD;  Location: [facility] OR;  Service: Open Heart Surgery;  Laterality: N/A;   aorto- fem bypass Bilateral    aortobiiliac bypass in 2000   DILATATION & CURETTAGE/HYSTEROSCOPY WITH MYOSURE N/A 12/27/2015   Procedure: DILATATION & CURETTAGE/HYSTEROSCOPY;  Surgeon: Patton Salles, MD;  Location: [facility]S;  Service: Gynecology;  Laterality: N/A;   DILATION AND CURETTAGE OF UTERUS  11/2015    ELBOW SURGERY Left ~2007   tendon repair by Dr. Ranell Patrick   EYE SURGERY Bilateral 2008   lasik   JOINT REPLACEMENT     lower aortic bypass  2000   per patient   PERIPHERAL VASCULAR INTERVENTION  12/03/2016   Stenting of anastomotic stenosis of the aortoiliac and right common iliac artery with 7 x 39 mm VBX covered stent and postdilated with 9 mm balloon.   Right innonimate bypass  05/18/2019   Aorto innominate bypass surgery with median sternotomy   STERNOTOMY  05/18/2019   Right aorto-innonimate bypass surgery 05/18/19 Dr. Laneta Simmers   TOTAL HIP ARTHROPLASTY Right 07/27/2014   Procedure: RIGHT TOTAL HIP ARTHROPLASTY ANTERIOR APPROACH;  Surgeon: Durene Romans, MD;  Location: [facility];  Service: Orthopedics;  Laterality: Right;   TOTAL KNEE ARTHROPLASTY Right 05/28/2022   Procedure: RIGHT TOTAL KNEE ARTHROPLASTY;  Surgeon: Tarry Kos, MD;  Location: [facility] OR;  Service: Orthopedics;  Laterality: Right;   Patient Active Problem List   Diagnosis Date Noted   Primary osteoarthritis of left knee 07/10/2022   Status post total right knee replacement 05/28/2022   Vitamin B12 deficiency 10/31/2021   Small bowel obstruction (HCC)    Partial small bowel obstruction (HCC) 10/28/2021   Primary osteoarthritis of right knee 06/20/2021   PAD (peripheral artery disease) (  HCC)    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   Aortoiliac occlusive disease (HCC) 12/03/2016   Centrilobular emphysema (HCC) 10/03/2016   Right groin pain 02/27/2016   Sacroiliitis (HCC) 02/27/2016   Postmenopausal bleeding 12/01/2015   Psoas tendinitis of right side 10/21/2015   Endometrial thickening on ultra sound 07/28/2015   S/P right THA, 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   Type 2 diabetes mellitus (HCC) 05/18/2014   DDD (degenerative disc disease) 03/18/2013   Metabolic syndrome 07/29/2012   Essential hypertension, benign 07/18/2012   Hyperlipidemia 07/18/2012   Obstructive sleep apnea 07/04/2012   Osteoporosis 07/04/2012    Morbid obesity (HCC) 07/04/2012    PCP: Lula Olszewski MD   REFERRING PROVIDER: Neomia Dear PA   REFERRING DIAG: Right TKA  THERAPY DIAG:  Stiffness of right knee, not elsewhere classified  Acute pain of right knee  Other abnormalities of gait and mobility  Localized edema  Rationale for Evaluation and Treatment: Rehabilitation  ONSET DATE: 05/28/2022 Days since surgery: 98   SUBJECTIVE:   SUBJECTIVE STATEMENT:  Pt states that the knee feels good. She has not had issues with the knee. It is constantly improving. She has been going to the gym still and continuing to work the strength. She is doing pool 2x/week  a week.   PERTINENT HISTORY: Aortic stenosis; Pre-diabetic,  Gout ( hasn't had an attack in 1.5 years) Osteopetrosis,  right hip replacement; Left ebow surgery 2007;  PAIN:  Are you having pain? No: NPRS scale: 0/10 Pain location: right knee  Pain description: aching  Aggravating factors: night time;  Relieving factors: rest   PRECAUTIONS: None  WEIGHT BEARING RESTRICTIONS: No  FALLS:  Has patient fallen in last 6 months? No  LIVING ENVIRONMENT: 15  going to the second floor. 2 steps going in.   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     PLOF: Independent  PATIENT GOALS:   To get back to exercises   NEXT MD VISIT:   OBJECTIVE:   DIAGNOSTIC FINDINGS:   PATIENT SURVEYS:  FOTO 81  4/10 Exceeded expectations  MUSCLE LENGTH:  POSTURE: No Significant postural limitations  PALPATION: No unexpected tenderness to palpation   LOWER EXTREMITY ROM:  Passive ROM Right eval Left eval R 2/16 2/19 2/26 3/6 4/10 4/29  Hip flexion          Hip extension          Hip abduction          Hip adduction          Hip internal rotation          Hip external rotation          Knee flexion 95 100 102 110 110 115 after manual  114 114  Knee extension -5 -6 -5 -3 -3 0 0 0  Ankle dorsiflexion          Ankle plantarflexion          Ankle  inversion          Ankle eversion           (Blank rows = not tested)  LOWER EXTREMITY MMT:  MMT Right eval Left eval    Hip flexion 14.6 24.8 29.5 35.9  Hip extension      Hip abduction 23.5 32.8 33.4 32.6  Hip adduction      Hip internal rotation      Hip external rotation      Knee flexion  Knee extension  27.2 25.0 35.2  Ankle dorsiflexion      Ankle plantarflexion      Ankle inversion      Ankle eversion       (Blank rows = not tested)    FUNCTIONAL TESTS:  STS without UE at this time: < 12s    TODAY'S TREATMENT:   4/29  Finalization of exercise program, RPE based training, continuation and progression of self exercise program in the gym  Exercises - Standing Knee Flexion Stretch on Step  - 1 x daily - 7 x weekly - 1 sets - 10 reps - 5 hold - Seated Knee Extension Stretch with Chair  - 1 x daily - 7 x weekly - 1 sets - 1 reps - 2-3 min hold - Hamstring Curl with Weight Machine  - 1 x daily - 4 x weekly - 3 sets - 10 reps - Knee Extension with Weight Machine  - 1 x daily - 4 x weekly - 3 sets - 10 reps - Lateral Step Down  - 1 x daily - 4 x weekly - 2 sets - 10 reps - Heel Raise on Step  - 1 x daily - 4 x weekly - 3 sets - 10 reps - Runner's Step Up/Down  - 1 x daily - 4 x weekly - 3 sets - 8 reps - Full Leg Press  - 1 x daily - 4 x weekly - 3 sets - 10 reps  4/10  Exercises - Gastroc Stretch on Wall   3 reps - 30 hold - Standing Knee Flexion Stretch on Step   10 reps - 5 hold - Seated Knee Extension Stretch with Chair  1 sets - 1 reps - 2-3 min hold - Heel Raises with Counter Support  3 sets - 10 reps - Blue band standing Terminal Knee Extension with Resistance   3 sets - 10 reps - Hamstring Curl with Weight Machine 25 lbs  3 sets - 10 reps - Knee Extension with Weight Machine (unable to do with L) 3 sets - 10 reps - Lateral Step Down  2 sets - 10 reps 8" step with UE  4/2 Nu step x95mins L5 Manual: PROM knee flexion; Grade II and III PA ad AP  mobilization; trigger points to the quad    SLR 3x10 1.5lbs Prone HS curl 1.5lbs 3x10 LAQ  3lbs 2x10 2s   Step downs 4 inch step 2x10  Lateral step ups     3/22 Nu-step 5 min L4  SLR 3x10 1.5lbs Prone HS curl 1.5lbs 3x10 LAQ  3lbs 2x10 2s   Step up 8 inch 2x10  Lateral step 6 inch  2x10   Step onto air-ex x20 fwd x20 lateral   Leg press 3x15   3/20  Bike: 6 min (full revolutions) seat #4; Lvl 1 117 flexion; 0 deg ext following mobilization  Patella mobilizations- grade III in all directions  R knee TKE joint mob grade III  GTB TKE 3x10 8" box step up 3x8 (with UE)  SLR 3x10 1.5lbs Prone HS curl 1.5lbs 3x10 LAQ  3lbs 2x10 2s  STS from high table (bothers L) Shuttle leg press staggered (R low) 3x8 75lbs    3/15 Patella mobilizations- grade III in all directions  Nu-ste Level 5 6 min  SLR 3x10 1.5 lbs  LAQ 1.5 lbs 3x10   Cybex leg press 70 lbs 2x15   Step down 4 inch x20  Step up 6 inch x20  Hip abduction machine 3x10 70 lbs      PATIENT EDUCATION:  Education details: HEP, symptom management  Person educated: Patient Education method: Explanation, Demonstration, Tactile cues, Verbal cues, and Handouts Education comprehension: verbalized understanding, returned demonstration, verbal cues required, tactile cues required, and needs further education  HOME EXERCISE PROGRAM: Access Code: [email or web address] URL: [email or web address] Date: 06/13/2022 Prepared by: Lorayne Bender  Exercises - Supine Knee Extension Stretch on Towel Roll  - 2 x daily - 7 x weekly - 3 sets - 5 reps - 10 sec hold hold - Supine Heel Slide with Strap  - 2 x daily - 7 x weekly - 3 sets - 10 reps - 5 sec  hold - Supine Quad Set  - 2 x daily - 7 x weekly - 3 sets - 10 reps - Active Straight Leg Raise with Quad Set  - 1 x daily - 7 x weekly - 3 sets - 10 reps  ASSESSMENT:  CLINICAL IMPRESSION: Patient reports near normalized function of the right lower extremity with  ability to perform independent exercise in the gym setting.  Patient's knee range of motion and strength  nearly normalized with mild eccentric lowering deficits on the right lower extremity.  Patient in the gym currently multiple days a week with good understanding of home exercise program.  Exercise progression and taper discussed.  Patient has met all physical therapy goals at this time.  DC this episode of care.   OBJECTIVE IMPAIRMENTS: Abnormal gait, decreased activity tolerance, decreased mobility, difficulty walking, decreased ROM, decreased strength, increased edema, and pain.   ACTIVITY LIMITATIONS: carrying, lifting, bending, sitting, standing, squatting, sleeping, stairs, transfers, and locomotion level  PARTICIPATION LIMITATIONS: meal prep, cleaning, laundry, driving, shopping, community activity, occupation, and yard work  PERSONAL FACTORS: 1-2 comorbidities: right hip replacement  are also affecting patient's functional outcome.   REHAB POTENTIAL: Excellent  CLINICAL DECISION MAKING: Stable/uncomplicated  EVALUATION COMPLEXITY: Low   GOALS: Goals reviewed with patient? Yes  SHORT TERM GOALS: Target date: 07/12/2022   Patient will increase right knee flexion to 110 degrees Baseline: Goal status: met  2.  Patient will demonstrate full passive right knee extension Baseline:  Goal status: met  3.  Patient will be independent with ambulation of a distance of 500 feet without a single-point cane Baseline:  Goal status: met  4.  Patient will be independent with basic exercise program Baseline:  Goal status: MET  LONG TERM GOALS: Target date:. 11/07/2022     Patient will go up and down 8 steps with reciprocal gait without pain Baseline:  Goal status: met  2.  Patient will demonstrate deep squat without pain in order to pick up items off the ground Baseline:  Goal status: met  3.  Patient will demonstrate full range of motion in order to perform ADLs without  increased pain Baseline:  Goal status: met  4.  Patient will be independent with full appropriate gym program Baseline:  Goal status: met  PLAN:  PT FREQUENCY: 1x/month  PT DURATION: 3 months  PLANNED INTERVENTIONS: Therapeutic exercises, Therapeutic activity, Neuromuscular re-education, Balance training, Gait training, Patient/Family education, Self Care, Joint mobilization, Stair training, DME instructions, Aquatic Therapy, Cryotherapy, Moist heat, Taping, and Manual therapy  Zebedee Iba PT, DPT 09/03/22 11:33 AM

## 2022-09-22 ENCOUNTER — Other Ambulatory Visit: Payer: Self-pay | Admitting: Acute Care

## 2022-09-22 DIAGNOSIS — Z122 Encounter for screening for malignant neoplasm of respiratory organs: Secondary | ICD-10-CM

## 2022-09-22 DIAGNOSIS — Z87891 Personal history of nicotine dependence: Secondary | ICD-10-CM

## 2022-10-08 ENCOUNTER — Encounter (HOSPITAL_BASED_OUTPATIENT_CLINIC_OR_DEPARTMENT_OTHER): Payer: Self-pay | Admitting: Physical Therapy

## 2022-10-08 ENCOUNTER — Ambulatory Visit (HOSPITAL_BASED_OUTPATIENT_CLINIC_OR_DEPARTMENT_OTHER): Payer: Medicare Other | Attending: Geriatric Medicine | Admitting: Physical Therapy

## 2022-10-08 ENCOUNTER — Other Ambulatory Visit: Payer: Self-pay

## 2022-10-08 DIAGNOSIS — M6281 Muscle weakness (generalized): Secondary | ICD-10-CM | POA: Insufficient documentation

## 2022-10-08 DIAGNOSIS — M546 Pain in thoracic spine: Secondary | ICD-10-CM | POA: Diagnosis present

## 2022-10-08 NOTE — Therapy (Signed)
OUTPATIENT PHYSICAL THERAPY THORACOLUMBAR EVALUATION   Patient Name: Diane Giles MRN: [id number] DOB:1955/05/23, 68 y.o., female Today's Date: 10/08/2022  END OF SESSION:  PT End of Session - 10/08/22 1058     Visit Number 1    Number of Visits 16    Date for PT Re-Evaluation 01/06/23    Authorization Type MCR    PT Start Time 1100    PT Stop Time 1140    PT Time Calculation (min) 40 min    Activity Tolerance Patient tolerated treatment well    Behavior During Therapy [facility] for tasks assessed/performed             Past Medical History:  Diagnosis Date   Aortic stenosis    mild-moderate AS 04/22/19 echo   Aortoiliac occlusive disease (HCC) 12/03/2016   Arthritis    Diabetes mellitus without complication (HCC)    per pt she is pre- diabetic   Diverticulitis    Gout    Heart murmur    Hyperlipidemia    Hypertension    Osteoporosis    Sleep apnea    Tobacco use disorder 07/04/2012   Past Surgical History:  Procedure Laterality Date   ABDOMINAL AORTOGRAM W/LOWER EXTREMITY N/A 12/03/2016   Procedure: Abdominal Aortogram w/Lower Extremity;  Surgeon: Chuck Hint, MD;  Location: [facility] INVASIVE CV LAB;  Service: Cardiovascular;  Laterality: N/A;   AORTA -INNOMIATE BYPASS N/A 05/18/2019   Procedure: AORTA -INNOMIATE BYPASS Using Hemashield Gold Graft Size 8mm;  Surgeon: Alleen Borne, MD;  Location: [facility] OR;  Service: Open Heart Surgery;  Laterality: N/A;   aorto- fem bypass Bilateral    aortobiiliac bypass in 2000   DILATATION & CURETTAGE/HYSTEROSCOPY WITH MYOSURE N/A 12/27/2015   Procedure: DILATATION & CURETTAGE/HYSTEROSCOPY;  Surgeon: Patton Salles, MD;  Location: [facility] ORS;  Service: Gynecology;  Laterality: N/A;   DILATION AND CURETTAGE OF UTERUS  11/2015   ELBOW SURGERY Left ~2007   tendon repair by Dr. Ranell Patrick   EYE SURGERY Bilateral 2008   lasik   JOINT REPLACEMENT     lower aortic bypass  2000   per patient   PERIPHERAL VASCULAR  INTERVENTION  12/03/2016   Stenting of anastomotic stenosis of the aortoiliac and right common iliac artery with 7 x 39 mm VBX covered stent and postdilated with 9 mm balloon.   Right innonimate bypass  05/18/2019   Aorto innominate bypass surgery with median sternotomy   STERNOTOMY  05/18/2019   Right aorto-innonimate bypass surgery 05/18/19 Dr. Laneta Simmers   TOTAL HIP ARTHROPLASTY Right 07/27/2014   Procedure: RIGHT TOTAL HIP ARTHROPLASTY ANTERIOR APPROACH;  Surgeon: Durene Romans, MD;  Location: [facility] ORS;  Service: Orthopedics;  Laterality: Right;   TOTAL KNEE ARTHROPLASTY Right 05/28/2022   Procedure: RIGHT TOTAL KNEE ARTHROPLASTY;  Surgeon: Tarry Kos, MD;  Location: [facility] OR;  Service: Orthopedics;  Laterality: Right;   Patient Active Problem List   Diagnosis Date Noted   Primary osteoarthritis of left knee 07/10/2022   Status post total right knee replacement 05/28/2022   Vitamin B12 deficiency 10/31/2021   Small bowel obstruction (HCC)    Partial small bowel obstruction (HCC) 10/28/2021   Primary osteoarthritis of right knee 06/20/2021   PAD (peripheral artery disease) (HCC)    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   Aortoiliac occlusive disease (HCC) 12/03/2016   Centrilobular emphysema (HCC) 10/03/2016   Right groin pain 02/27/2016   Sacroiliitis (HCC) 02/27/2016   Postmenopausal bleeding 12/01/2015   Psoas  tendinitis of right side 10/21/2015   Endometrial thickening on ultra sound 07/28/2015   S/P right THA, 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   Type 2 diabetes mellitus (HCC) 05/18/2014   DDD (degenerative disc disease) 03/18/2013   Metabolic syndrome 07/29/2012   Essential hypertension, benign 07/18/2012   Hyperlipidemia 07/18/2012   Obstructive sleep apnea 07/04/2012   Osteoporosis 07/04/2012   Morbid obesity (HCC) 07/04/2012    PCP: Lula Olszewski MD   REFERRING PROVIDER: Theodis Shove, DO   REFERRING DIAG: M54.9 (ICD-10-CM) - Dorsalgia, unspecified   THERAPY  DIAG:  Muscle weakness (generalized) - Plan: PT plan of care cert/re-cert  Pain in thoracic spine - Plan: PT plan of care cert/re-cert  Rationale for Evaluation and Treatment: Rehabilitation  ONSET DATE: April 2023  SUBJECTIVE:   SUBJECTIVE STATEMENT: Pt states that the pain is the R sided and along bra-line area. Intermittent pain that become constant for short periods. Starts with static positioning. No specific MOI but cannot do the back extension machine. Pt states that the pain can cause her to not take deep breath. Pain with sneezing. Pt denies NT or radiating pain. Pt denies red flag symptoms- cauda equina. Pt does have feelings of both legs getting weaker due to the pain. Pt does have tenderness in that particular. Pt denies systemic symptoms, history of kidney issues.   PERTINENT HISTORY: Aortic stenosis; Pre-diabetic,  Gout ( hasn't had an attack in 1.5 years) Osteopetrosis,  right hip replacement; Left ebow surgery 2007; R TKA PAIN:  Are you having pain? Yes: NPRS scale: 1/10 pain; 10/10 Pain location: R lat Pain description: pulsing, deep, achey Aggravating factors: standing in kitchen, bending/laundry, sitting too long Relieving factors: rest, laying down   PRECAUTIONS: None  WEIGHT BEARING RESTRICTIONS: No  FALLS:  Has patient fallen in last 6 months? No  LIVING ENVIRONMENT: 15  going to the second floor. 2 steps going in.   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   PLOF: Independent  PATIENT GOALS:   To get back to exercises   NEXT MD VISIT:   OBJECTIVE:   DIAGNOSTIC FINDINGS:   IMPRESSION: 1. Mild lumbar spondylosis without spinal canal stenosis. 2. Central disc protrusion at L5-S1 displaces the traversing left S1 nerve root in the subarticular zone.  PATIENT SURVEYS:  FOTO 54 70 @ DC 5 pts MCII  COGNITION: Overall cognitive status: Within functional limits for tasks assessed     SENSATION: WFL   MUSCLE LENGTH: WFL at LE; quad  limited due to R TKA  POSTURE: mild kyphosis  PALPATION: TTP of R T/S paraspinals and lat   LUMBAR ROM:   Active  A/PROM  eval  Flexion WFL  Extension WFL  Right lateral flexion WFL p!  Left lateral flexion WFL  Right rotation WFL  Left rotation WFL   (Blank rows = not tested)  No recreation of pain with UE reach   LOWER EXTREMITY ROM:  A ROM Right eval Left eval  Hip flexion 110 110  Hip extension 0 0  Hip abduction 30 30  Hip adduction    Hip internal rotation    Hip external rotation    Knee flexion 115 120  Knee extension 0 0  Ankle dorsiflexion    Ankle plantarflexion    Ankle inversion    Ankle eversion     (Blank rows = not tested)  LOWER EXTREMITY MMT: deferred due to pain recreation  LUMBAR SPECIAL TESTS:  Straight leg raise test: Negative and FABER test: Negative  GAIT: WFL, decreased trunk rotation   TODAY'S TREATMENT:                                                                                                                              DATE:   6/3  Exercises - Seated Quadratus Lumborum Stretch in Chair  - 1 x daily - 7 x weekly - 1 sets - 3 reps - 30 hold - Cat Cow  - 1 x daily - 7 x weekly - 1 sets - 10 reps - 3 hold - Seated Thoracic Flexion and Rotation with Swiss Ball  - 1 x daily - 7 x weekly - 1 sets - 10 reps - Seated Flexion Stretch  - 1 x daily - 7 x weekly - 3 sets - 10 reps - Latissimus Dorsi Stretch at Wall  - 1 x daily - 7 x weekly - 1 sets - 3 reps - 30 hold  PATIENT EDUCATION:  Education details: MOI, diagnosis, prognosis, anatomy, exercise progression, DOMS expectations, muscle firing,  envelope of function, HEP, POC  Person educated: Patient Education method: Explanation, Demonstration, Tactile cues, Verbal cues, and Handouts Education comprehension: verbalized understanding, returned demonstration, verbal cues required, tactile cues required, and needs further education  HOME EXERCISE PROGRAM:  Access Code:  [email or web address] URL: [email or web address] Date: 10/08/2022 Prepared by: Zebedee Iba ASSESSMENT:  CLINICAL IMPRESSION: Patient is a 67 y.o. female who was seen today for physical therapy evaluation and treatment for c/c of R sided T/S pain. Pt's s/s appear consistent with potential thoracic paraspinal strain with concurrent lat strain. However, UE movements do no recreate R sided pain. Pt's pain is moderately sensitive and irritable with movement. Pain appears primarily muscular in nature without signs of radiculopathy. Pt's is more stiffness dominant at this time and pain was recreated with palpation. Plan to continue with mobility and pain management techniques at future sessions. Pt would benefit from continued skilled therapy in order to reach goals and maximize functional thoracolumbar strength and ROM for full return to PLOF.   OBJECTIVE IMPAIRMENTS: Abnormal gait, decreased activity tolerance, decreased mobility, difficulty walking, decreased ROM, decreased strength, increased edema, and pain.   ACTIVITY LIMITATIONS: carrying, lifting, bending, sitting, standing, squatting, sleeping, stairs, transfers, and locomotion level  PARTICIPATION LIMITATIONS: meal prep, cleaning, laundry, driving, shopping, community activity, occupation, and yard work  PERSONAL FACTORS: 1-2 comorbidities:    are also affecting patient's functional outcome.   REHAB POTENTIAL: Good  CLINICAL DECISION MAKING: Stable/uncomplicated  EVALUATION COMPLEXITY: Low   GOALS:   SHORT TERM GOALS: 11/19/2022    Pt will become independent with HEP in order to demonstrate synthesis of PT education. Goal status: INITIAL  2.  Pt will score at least 5 pt increase on FOTO to demonstrate functional improvement in MCII and pt perceived function.   Baseline:  Goal status: INITIAL  3.  Pt will report at least 2 pt reduction on NPRS scale for pain in order to demonstrate functional  improvement with household activity,  self care, and ADL.  Baseline:  Goal status: INITIAL    LONG TERM GOALS: Target date: 12/31/2022   Pt  will become independent with final HEP in order to demonstrate synthesis of PT education.  Goal status: INITIAL  2.  Pt will be able to demonstrate ability to bend and lifting without pain in order to demonstrate functional improvement in midback function for self-care and house hold duties.  Baseline:  Goal status: INITIAL  3.  Pt will be able to demonstrate/report ability to sit/stand/sleep for extended periods of time without pain in order to demonstrate functional improvement and tolerance to static positioning.  Goal status: INITIAL  4. Pt will score >/= 70 on FOTO to demonstrate improvement in perceived thoracolumbar function.  Baseline:  Goal status: INITIAL  PLAN:  PT FREQUENCY: 1-2x/week  PT DURATION: 12 wks  PLANNED INTERVENTIONS: Therapeutic exercises, Therapeutic activity, Neuromuscular re-education, Balance training, Gait training, Patient/Family education, Self Care, Joint mobilization, Stair training, DME instructions, Aquatic Therapy, Cryotherapy, Moist heat, Taping, and Manual therapy  PLAN FOR NEXT SESSION: STM and joint mobs to R PRN, open book, upper back stretch   Zebedee Iba, PT 10/08/2022, 1:01 PM

## 2022-10-16 ENCOUNTER — Encounter (HOSPITAL_COMMUNITY): Payer: Self-pay

## 2022-10-16 ENCOUNTER — Ambulatory Visit (HOSPITAL_COMMUNITY)
Admission: RE | Admit: 2022-10-16 | Discharge: 2022-10-16 | Disposition: A | Discharge status: Home / Self Care | Payer: Medicare Other | Source: Ambulatory Visit | Attending: Family Medicine | Admitting: Family Medicine

## 2022-10-16 DIAGNOSIS — Z122 Encounter for screening for malignant neoplasm of respiratory organs: Secondary | ICD-10-CM | POA: Insufficient documentation

## 2022-10-16 DIAGNOSIS — Z87891 Personal history of nicotine dependence: Secondary | ICD-10-CM | POA: Insufficient documentation

## 2022-10-17 ENCOUNTER — Ambulatory Visit (HOSPITAL_BASED_OUTPATIENT_CLINIC_OR_DEPARTMENT_OTHER): Payer: Medicare Other | Admitting: Physical Therapy

## 2022-10-17 ENCOUNTER — Encounter (HOSPITAL_BASED_OUTPATIENT_CLINIC_OR_DEPARTMENT_OTHER): Payer: Self-pay | Admitting: Physical Therapy

## 2022-10-17 DIAGNOSIS — M546 Pain in thoracic spine: Secondary | ICD-10-CM

## 2022-10-17 DIAGNOSIS — M6281 Muscle weakness (generalized): Secondary | ICD-10-CM | POA: Diagnosis not present

## 2022-10-17 NOTE — Therapy (Signed)
OUTPATIENT PHYSICAL THERAPY THORACOLUMBAR EVALUATION   Patient Name: Diane Giles MRN: [id number] DOB:1955-06-11, 67 y.o., female Today's Date: 10/17/2022  END OF SESSION:  PT End of Session - 10/17/22 1014     Visit Number 2    Number of Visits 16    Date for PT Re-Evaluation 01/06/23    Authorization Type MCR    PT Start Time 0931    PT Stop Time 1000    PT Time Calculation (min) 29 min    Activity Tolerance Patient tolerated treatment well    Behavior During Therapy [facility] for tasks assessed/performed              Past Medical History:  Diagnosis Date   Aortic stenosis    mild-moderate AS 04/22/19 echo   Aortoiliac occlusive disease (HCC) 12/03/2016   Arthritis    Diabetes mellitus without complication (HCC)    per pt she is pre- diabetic   Diverticulitis    Gout    Heart murmur    Hyperlipidemia    Hypertension    Osteoporosis    Sleep apnea    Tobacco use disorder 07/04/2012   Past Surgical History:  Procedure Laterality Date   ABDOMINAL AORTOGRAM W/LOWER EXTREMITY N/A 12/03/2016   Procedure: Abdominal Aortogram w/Lower Extremity;  Surgeon: Chuck Hint, MD;  Location: [facility] INVASIVE CV LAB;  Service: Cardiovascular;  Laterality: N/A;   AORTA -INNOMIATE BYPASS N/A 05/18/2019   Procedure: AORTA -INNOMIATE BYPASS Using Hemashield Gold Graft Size 8mm;  Surgeon: Alleen Borne, MD;  Location: [facility] OR;  Service: Open Heart Surgery;  Laterality: N/A;   aorto- fem bypass Bilateral    aortobiiliac bypass in 2000   DILATATION & CURETTAGE/HYSTEROSCOPY WITH MYOSURE N/A 12/27/2015   Procedure: DILATATION & CURETTAGE/HYSTEROSCOPY;  Surgeon: Patton Salles, MD;  Location: [facility] ORS;  Service: Gynecology;  Laterality: N/A;   DILATION AND CURETTAGE OF UTERUS  11/2015   ELBOW SURGERY Left ~2007   tendon repair by Dr. Ranell Patrick   EYE SURGERY Bilateral 2008   lasik   JOINT REPLACEMENT     lower aortic bypass  2000   per patient   PERIPHERAL VASCULAR  INTERVENTION  12/03/2016   Stenting of anastomotic stenosis of the aortoiliac and right common iliac artery with 7 x 39 mm VBX covered stent and postdilated with 9 mm balloon.   Right innonimate bypass  05/18/2019   Aorto innominate bypass surgery with median sternotomy   STERNOTOMY  05/18/2019   Right aorto-innonimate bypass surgery 05/18/19 Dr. Laneta Simmers   TOTAL HIP ARTHROPLASTY Right 07/27/2014   Procedure: RIGHT TOTAL HIP ARTHROPLASTY ANTERIOR APPROACH;  Surgeon: Durene Romans, MD;  Location: [facility];  Service: Orthopedics;  Laterality: Right;   TOTAL KNEE ARTHROPLASTY Right 05/28/2022   Procedure: RIGHT TOTAL KNEE ARTHROPLASTY;  Surgeon: Tarry Kos, MD;  Location: [facility] OR;  Service: Orthopedics;  Laterality: Right;   Patient Active Problem List   Diagnosis Date Noted   Primary osteoarthritis of left knee 07/10/2022   Status post total right knee replacement 05/28/2022   Vitamin B12 deficiency 10/31/2021   Small bowel obstruction (HCC)    Partial small bowel obstruction (HCC) 10/28/2021   Primary osteoarthritis of right knee 06/20/2021   PAD (peripheral artery disease) (HCC)    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   Aortoiliac occlusive disease (HCC) 12/03/2016   Centrilobular emphysema (HCC) 10/03/2016   Right groin pain 02/27/2016   Sacroiliitis (HCC) 02/27/2016   Postmenopausal bleeding 12/01/2015  Psoas tendinitis of right side 10/21/2015   Endometrial thickening on ultra sound 07/28/2015   S/P right THA, 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   Type 2 diabetes mellitus (HCC) 05/18/2014   DDD (degenerative disc disease) 03/18/2013   Metabolic syndrome 07/29/2012   Essential hypertension, benign 07/18/2012   Hyperlipidemia 07/18/2012   Obstructive sleep apnea 07/04/2012   Osteoporosis 07/04/2012   Morbid obesity (HCC) 07/04/2012    PCP: Lula Olszewski MD   REFERRING PROVIDER: Theodis Shove, DO   REFERRING DIAG: M54.9 (ICD-10-CM) - Dorsalgia, unspecified   THERAPY  DIAG:  Pain in thoracic spine  Muscle weakness (generalized)  Rationale for Evaluation and Treatment: Rehabilitation  ONSET DATE: April 2023  SUBJECTIVE:   SUBJECTIVE STATEMENT:  Pt states that the pain is significantly better. She states that the pain is fleeting and less frequent. Pt states the HEP takes the pain away. Pt can change position to make the pain go away. Able to stand longer now.    Eval:  Pt states that the pain is the R sided and along bra-line area. Intermittent pain that become constant for short periods. Starts with static positioning. No specific MOI but cannot do the back extension machine. Pt states that the pain can cause her to not take deep breath. Pain with sneezing. Pt denies NT or radiating pain. Pt denies red flag symptoms- cauda equina. Pt does have feelings of both legs getting weaker due to the pain. Pt does have tenderness in that particular. Pt denies systemic symptoms, history of kidney issues.   PERTINENT HISTORY: Aortic stenosis; Pre-diabetic,  Gout ( hasn't had an attack in 1.5 years) Osteopetrosis,  right hip replacement; Left ebow surgery 2007; R TKA PAIN:  Are you having pain? No: NPRS scale: 0/10 pain; 2/10 at worst Pain location: R lat Pain description: pulsing, deep, achey Aggravating factors: standing in kitchen, bending/laundry, sitting too long Relieving factors: rest, laying down   PRECAUTIONS: None  WEIGHT BEARING RESTRICTIONS: No  FALLS:  Has patient fallen in last 6 months? No  LIVING ENVIRONMENT: 15  going to the second floor. 2 steps going in.   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   PLOF: Independent  PATIENT GOALS:   To get back to exercises   NEXT MD VISIT:   OBJECTIVE:   DIAGNOSTIC FINDINGS:   IMPRESSION: 1. Mild lumbar spondylosis without spinal canal stenosis. 2. Central disc protrusion at L5-S1 displaces the traversing left S1 nerve root in the subarticular zone.  PATIENT SURVEYS:  FOTO  54 70 @ DC 5 pts MCII  6/12    COGNITION: Overall cognitive status: Within functional limits for tasks assessed     SENSATION: WFL   MUSCLE LENGTH: WFL at LE; quad limited due to R TKA  POSTURE: mild kyphosis  PALPATION: TTP of R T/S paraspinals and lat   LUMBAR ROM:   Active  A/PROM  6/12  Flexion WFL  Extension WFL  Right lateral flexion WFL  Left lateral flexion WFL  Right rotation WFL  Left rotation WFL   (Blank rows = not tested)   GAIT: WFL   TODAY'S TREATMENT:  DATE:   6/12  STM R lower trap, T/S paraspinals, lat   Exercises - Latissimus Dorsi Stretch at Wall  - 1 x daily - 7 x weekly - 1 sets - 3 reps - 30 hold - Standing Lower Cervical and Upper Thoracic Stretch  - 1 x daily - 7 x weekly - 1 sets - 3 reps - 15 hold    6/3  Exercises - Seated Quadratus Lumborum Stretch in Chair  - 1 x daily - 7 x weekly - 1 sets - 3 reps - 30 hold - Cat Cow  - 1 x daily - 7 x weekly - 1 sets - 10 reps - 3 hold - Seated Thoracic Flexion and Rotation with Swiss Ball  - 1 x daily - 7 x weekly - 1 sets - 10 reps - Seated Flexion Stretch  - 1 x daily - 7 x weekly - 3 sets - 10 reps - Latissimus Dorsi Stretch at Wall  - 1 x daily - 7 x weekly - 1 sets - 3 reps - 30 hold  PATIENT EDUCATION:  Education details: MOI, diagnosis, prognosis, anatomy, exercise progression, DOMS expectations, muscle firing,  envelope of function, HEP, POC  Person educated: Patient Education method: Explanation, Demonstration, Tactile cues, Verbal cues, and Handouts Education comprehension: verbalized understanding, returned demonstration, verbal cues required, tactile cues required, and needs further education  HOME EXERCISE PROGRAM:  Access Code: [email or web address] URL: [email or web address] Date: 10/08/2022 Prepared by: Zebedee Iba ASSESSMENT:  CLINICAL  IMPRESSION: Pt with significantly improved pain since IE and with improvement in soft tissue sensitivity and extensibility following session today. Pt is at near d/c expected FOTO outcome today already. Pt appears able to self manage pain with recent exercise addition as well as awareness of daily movement/postural changes. Plan for pt to schedule 1x visit in 4-5 weeks for final check if needed. HEP updated with additional light stretch. Pt would benefit from continued skilled therapy in order to reach goals and maximize functional thoracolumbar strength and ROM for full return to PLOF.   OBJECTIVE IMPAIRMENTS: Abnormal gait, decreased activity tolerance, decreased mobility, difficulty walking, decreased ROM, decreased strength, increased edema, and pain.   ACTIVITY LIMITATIONS: carrying, lifting, bending, sitting, standing, squatting, sleeping, stairs, transfers, and locomotion level  PARTICIPATION LIMITATIONS: meal prep, cleaning, laundry, driving, shopping, community activity, occupation, and yard work  PERSONAL FACTORS: 1-2 comorbidities:    are also affecting patient's functional outcome.   REHAB POTENTIAL: Good  CLINICAL DECISION MAKING: Stable/uncomplicated  EVALUATION COMPLEXITY: Low   GOALS:   SHORT TERM GOALS: 11/19/2022    Pt will become independent with HEP in order to demonstrate synthesis of PT education. Goal status: INITIAL  2.  Pt will score at least 5 pt increase on FOTO to demonstrate functional improvement in MCII and pt perceived function.   Baseline:  Goal status: INITIAL  3.  Pt will report at least 2 pt reduction on NPRS scale for pain in order to demonstrate functional improvement with household activity, self care, and ADL.  Baseline:  Goal status: INITIAL    LONG TERM GOALS: Target date: 12/31/2022   Pt  will become independent with final HEP in order to demonstrate synthesis of PT education.  Goal status: INITIAL  2.  Pt will be able to  demonstrate ability to bend and lifting without pain in order to demonstrate functional improvement in midback function for self-care and house hold duties.  Baseline:  Goal status: INITIAL  3.  Pt will be able  to demonstrate/report ability to sit/stand/sleep for extended periods of time without pain in order to demonstrate functional improvement and tolerance to static positioning.  Goal status: INITIAL  4. Pt will score >/= 70 on FOTO to demonstrate improvement in perceived thoracolumbar function.  Baseline:  Goal status: INITIAL  PLAN:  PT FREQUENCY: 1-2x/week  PT DURATION: 12 wks  PLANNED INTERVENTIONS: Therapeutic exercises, Therapeutic activity, Neuromuscular re-education, Balance training, Gait training, Patient/Family education, Self Care, Joint mobilization, Stair training, DME instructions, Aquatic Therapy, Cryotherapy, Moist heat, Taping, and Manual therapy  PLAN FOR NEXT SESSION: likely D/C, open book and/or TPDN PRN   Zebedee Iba, PT 10/17/2022, 10:17 AM

## 2022-10-22 ENCOUNTER — Other Ambulatory Visit: Payer: Self-pay | Admitting: Acute Care

## 2022-10-22 DIAGNOSIS — Z87891 Personal history of nicotine dependence: Secondary | ICD-10-CM

## 2022-10-22 DIAGNOSIS — Z122 Encounter for screening for malignant neoplasm of respiratory organs: Secondary | ICD-10-CM

## 2022-10-24 ENCOUNTER — Encounter (HOSPITAL_BASED_OUTPATIENT_CLINIC_OR_DEPARTMENT_OTHER): Payer: Medicare Other | Admitting: Physical Therapy

## 2022-10-31 ENCOUNTER — Encounter (HOSPITAL_BASED_OUTPATIENT_CLINIC_OR_DEPARTMENT_OTHER): Payer: Medicare Other | Admitting: Physical Therapy

## 2022-11-07 NOTE — Addendum Note (Signed)
Addended by: Zebedee Iba on: 11/07/2022 12:34 PM   Modules accepted: Orders

## 2022-11-20 ENCOUNTER — Ambulatory Visit: Payer: Medicare Other | Admitting: Orthopaedic Surgery

## 2022-11-20 ENCOUNTER — Other Ambulatory Visit (INDEPENDENT_AMBULATORY_CARE_PROVIDER_SITE_OTHER): Payer: Medicare Other

## 2022-11-20 ENCOUNTER — Encounter: Payer: Self-pay | Admitting: Orthopaedic Surgery

## 2022-11-20 DIAGNOSIS — M1712 Unilateral primary osteoarthritis, left knee: Secondary | ICD-10-CM

## 2022-11-20 DIAGNOSIS — Z96651 Presence of right artificial knee joint: Secondary | ICD-10-CM

## 2022-11-20 NOTE — Progress Notes (Signed)
No [place]  Office Visit Note   Patient: Diane Giles           Date of Birth: [birth year]           MRN: [id number] Visit Date: 11/20/2022              Requested by: Lula Olszewski, MD [address] [place],  [place] [address] PCP: Lula Olszewski, MD   Assessment & Plan: Visit Diagnoses:  1. Status post total right knee replacement   2. Primary osteoarthritis of left knee     Plan: Patient is now 71-month status post right total knee replacement and she is very happy with the outcome and she would like to make arrangements for the left knee replacement sometime in October.  She is scheduled to see her cardiologist Dr. Jacinto Halim in the near future anyways.  Eunice Blase will call the patient to confirm surgery time.  Impression is severe left knee degenerative joint disease secondary to Osteoarthritis.  Bone on bone joint space narrowing is seen on radiographs with mild valgus alignment.  At this point, conservative treatments fail to provide any significant relief and the pain is severely affecting ADLs and quality of life.  Based on treatment options, the patient has elected to move forward with a knee replacement.  We have discussed the surgical risks that include but are not limited to infection, DVT, leg length discrepancy, stiffness, numbness, tingling, incomplete relief of pain.  Recovery and prognosis were also reviewed.    Anticoagulants: aspirin Postop anticoagulation: Aspirin 81 mg Diabetic: No  Nickel allergy: No Prior DVT/PE: No Tobacco use: No Clearances needed for surgery: None Anticipated discharge dispo: Home   Follow-Up Instructions: No follow-ups on file.   Orders:  Orders Placed This Encounter  Procedures   XR Knee 1-2 Views Right   XR Knee 1-2 Views Left   No orders of the defined types were placed in this encounter.     Procedures: No procedures performed   Clinical Data: No additional findings.   Subjective: Chief Complaint  Patient presents  with   Right Knee - Follow-up    Right total knee arthroplasty 05/28/2022   Left Knee - Follow-up    HPI [name] is a 67 year old female here for 24-month postop check for right total knee replacement and follow-up for advanced left knee DJD.  Review of Systems   Objective: Vital Signs: LMP 05/07/2005 (Approximate)   Physical Exam  Ortho Exam Examination right knee shows fully healed surgical scar.  Excellent range of motion. Examination of left knee shows lateral joint line tenderness and a valgus deformity with pain and crepitus with range of motion. Specialty Comments:  No specialty comments available.  Imaging: XR Knee 1-2 Views Left  Result Date: 11/20/2022 X-rays demonstrate advanced DJD of the left knee with bone-on-bone joint space narrowing and mild valgus deformity.  XR Knee 1-2 Views Right  Result Date: 11/20/2022 Stable right total knee replacement in good alignment     PMFS History: Patient Active Problem List   Diagnosis Date Noted   Primary osteoarthritis of left knee 07/10/2022   Status post total right knee replacement 05/28/2022   Vitamin B12 deficiency 10/31/2021   Small bowel obstruction (HCC)    Partial small bowel obstruction (HCC) 10/28/2021   Primary osteoarthritis of right knee 06/20/2021   PAD (peripheral artery disease) (HCC)    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   Aortoiliac occlusive disease (HCC) 12/03/2016   Centrilobular emphysema (HCC) 10/03/2016  Right groin pain 02/27/2016   Sacroiliitis (HCC) 02/27/2016   Postmenopausal bleeding 12/01/2015   Psoas tendinitis of right side 10/21/2015   Endometrial thickening on ultra sound 07/28/2015   S/P right THA, 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   Type 2 diabetes mellitus (HCC) 05/18/2014   DDD (degenerative disc disease) 03/18/2013   Metabolic syndrome 07/29/2012   Essential hypertension, benign 07/18/2012   Hyperlipidemia 07/18/2012   Obstructive sleep apnea 07/04/2012   Osteoporosis  07/04/2012   Morbid obesity (HCC) 07/04/2012   Past Medical History:  Diagnosis Date   Aortic stenosis    mild-moderate AS 04/22/19 echo   Aortoiliac occlusive disease (HCC) 12/03/2016   Arthritis    Diabetes mellitus without complication (HCC)    per pt she is pre- diabetic   Diverticulitis    Gout    Heart murmur    Hyperlipidemia    Hypertension    Osteoporosis    Sleep apnea    Tobacco use disorder 07/04/2012    Family History  Problem Relation Age of Onset   Migraines Mother    Thyroid disease Mother        hypothyroid   Cancer Father 18       Dec age 89 with pancreatic Ca    Past Surgical History:  Procedure Laterality Date   ABDOMINAL AORTOGRAM W/LOWER EXTREMITY N/A 12/03/2016   Procedure: Abdominal Aortogram w/Lower Extremity;  Surgeon: Chuck Hint, MD;  Location: [facility] INVASIVE CV LAB;  Service: Cardiovascular;  Laterality: N/A;   AORTA -INNOMIATE BYPASS N/A 05/18/2019   Procedure: AORTA -INNOMIATE BYPASS Using Hemashield Gold Graft Size 8mm;  Surgeon: Alleen Borne, MD;  Location: [facility] OR;  Service: Open Heart Surgery;  Laterality: N/A;   aorto- fem bypass Bilateral    aortobiiliac bypass in 2000   DILATATION & CURETTAGE/HYSTEROSCOPY WITH MYOSURE N/A 12/27/2015   Procedure: DILATATION & CURETTAGE/HYSTEROSCOPY;  Surgeon: Patton Salles, MD;  Location: [facility] ORS;  Service: Gynecology;  Laterality: N/A;   DILATION AND CURETTAGE OF UTERUS  11/2015   ELBOW SURGERY Left ~2007   tendon repair by Dr. Ranell Patrick   EYE SURGERY Bilateral 2008   lasik   JOINT REPLACEMENT     lower aortic bypass  2000   per patient   PERIPHERAL VASCULAR INTERVENTION  12/03/2016   Stenting of anastomotic stenosis of the aortoiliac and right common iliac artery with 7 x 39 mm VBX covered stent and postdilated with 9 mm balloon.   Right innonimate bypass  05/18/2019   Aorto innominate bypass surgery with median sternotomy   STERNOTOMY  05/18/2019   Right aorto-innonimate bypass  surgery 05/18/19 Dr. Laneta Simmers   TOTAL HIP ARTHROPLASTY Right 07/27/2014   Procedure: RIGHT TOTAL HIP ARTHROPLASTY ANTERIOR APPROACH;  Surgeon: Durene Romans, MD;  Location: [facility] ORS;  Service: Orthopedics;  Laterality: Right;   TOTAL KNEE ARTHROPLASTY Right 05/28/2022   Procedure: RIGHT TOTAL KNEE ARTHROPLASTY;  Surgeon: Tarry Kos, MD;  Location: [facility] OR;  Service: Orthopedics;  Laterality: Right;   Social History   Occupational History   Occupation: Tree surgeon  Tobacco Use   Smoking status: Former    Current packs/day: 0.00    Average packs/day: 0.5 packs/day for 40.0 years (20.0 ttl pk-yrs)    Types: Cigarettes    Start date: 02/23/1977    Quit date: 02/23/2017    Years since quitting: 5.7   Smokeless tobacco: Never  Vaping Use   Vaping status: Never Used  Substance and Sexual Activity   Alcohol use:  Yes    Comment: very rare--1/month   Drug use: No   Sexual activity: Yes    Partners: Male    Birth control/protection: Post-menopausal

## 2022-12-03 ENCOUNTER — Encounter (HOSPITAL_BASED_OUTPATIENT_CLINIC_OR_DEPARTMENT_OTHER): Payer: Self-pay | Admitting: Physical Therapy

## 2022-12-03 ENCOUNTER — Encounter (HOSPITAL_BASED_OUTPATIENT_CLINIC_OR_DEPARTMENT_OTHER): Payer: Self-pay

## 2022-12-03 ENCOUNTER — Ambulatory Visit (HOSPITAL_BASED_OUTPATIENT_CLINIC_OR_DEPARTMENT_OTHER): Payer: Medicare Other | Admitting: Physical Therapy

## 2022-12-03 DIAGNOSIS — M6281 Muscle weakness (generalized): Secondary | ICD-10-CM | POA: Insufficient documentation

## 2022-12-03 DIAGNOSIS — M546 Pain in thoracic spine: Secondary | ICD-10-CM | POA: Insufficient documentation

## 2022-12-03 NOTE — Therapy (Addendum)
OUTPATIENT PHYSICAL THERAPY THORACOLUMBAR EVALUATION  PHYSICAL THERAPY DISCHARGE SUMMARY  Visits from Start of Care: 3  Plan: Patient agrees to discharge.  Patient goals were not met. Patient is being discharged due to not returning to therapy.      Patient Name: EZINNE PUENTES MRN: [id number] DOB:27-May-1955, 67 y.o., female Today's Date: 12/03/2022  END OF SESSION:  PT End of Session - 12/03/22 1118     Visit Number 3    Number of Visits 16    Date for PT Re-Evaluation 01/06/23    Authorization Type MCR    PT Start Time 1100    PT Stop Time 1115    PT Time Calculation (min) 15 min    Activity Tolerance Patient tolerated treatment well    Behavior During Therapy [facility] for tasks assessed/performed               Past Medical History:  Diagnosis Date   Aortic stenosis    mild-moderate AS 04/22/19 echo   Aortoiliac occlusive disease (HCC) 12/03/2016   Arthritis    Diabetes mellitus without complication (HCC)    per pt she is pre- diabetic   Diverticulitis    Gout    Heart murmur    Hyperlipidemia    Hypertension    Osteoporosis    Sleep apnea    Tobacco use disorder 07/04/2012   Past Surgical History:  Procedure Laterality Date   ABDOMINAL AORTOGRAM W/LOWER EXTREMITY N/A 12/03/2016   Procedure: Abdominal Aortogram w/Lower Extremity;  Surgeon: Chuck Hint, MD;  Location: [facility] INVASIVE CV LAB;  Service: Cardiovascular;  Laterality: N/A;   AORTA -INNOMIATE BYPASS N/A 05/18/2019   Procedure: AORTA -INNOMIATE BYPASS Using Hemashield Gold Graft Size 8mm;  Surgeon: Alleen Borne, MD;  Location: [facility] OR;  Service: Open Heart Surgery;  Laterality: N/A;   aorto- fem bypass Bilateral    aortobiiliac bypass in 2000   DILATATION & CURETTAGE/HYSTEROSCOPY WITH MYOSURE N/A 12/27/2015   Procedure: DILATATION & CURETTAGE/HYSTEROSCOPY;  Surgeon: Patton Salles, MD;  Location: [facility];  Service: Gynecology;  Laterality: N/A;   DILATION AND CURETTAGE OF UTERUS   11/2015   ELBOW SURGERY Left ~2007   tendon repair by Dr. Ranell Patrick   EYE SURGERY Bilateral 2008   lasik   JOINT REPLACEMENT     lower aortic bypass  2000   per patient   PERIPHERAL VASCULAR INTERVENTION  12/03/2016   Stenting of anastomotic stenosis of the aortoiliac and right common iliac artery with 7 x 39 mm VBX covered stent and postdilated with 9 mm balloon.   Right innonimate bypass  05/18/2019   Aorto innominate bypass surgery with median sternotomy   STERNOTOMY  05/18/2019   Right aorto-innonimate bypass surgery 05/18/19 Dr. Laneta Simmers   TOTAL HIP ARTHROPLASTY Right 07/27/2014   Procedure: RIGHT TOTAL HIP ARTHROPLASTY ANTERIOR APPROACH;  Surgeon: Durene Romans, MD;  Location: [facility];  Service: Orthopedics;  Laterality: Right;   TOTAL KNEE ARTHROPLASTY Right 05/28/2022   Procedure: RIGHT TOTAL KNEE ARTHROPLASTY;  Surgeon: Tarry Kos, MD;  Location: [facility] OR;  Service: Orthopedics;  Laterality: Right;   Patient Active Problem List   Diagnosis Date Noted   Primary osteoarthritis of left knee 07/10/2022   Status post total right knee replacement 05/28/2022   Vitamin B12 deficiency 10/31/2021   Small bowel obstruction (HCC)    Partial small bowel obstruction (HCC) 10/28/2021   Primary osteoarthritis of right knee 06/20/2021   PAD (peripheral artery disease) (HCC)  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   Aortoiliac occlusive disease (HCC) 12/03/2016   Centrilobular emphysema (HCC) 10/03/2016   Right groin pain 02/27/2016   Sacroiliitis (HCC) 02/27/2016   Postmenopausal bleeding 12/01/2015   Psoas tendinitis of right side 10/21/2015   Endometrial thickening on ultra sound 07/28/2015   S/P right THA, 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   Type 2 diabetes mellitus (HCC) 05/18/2014   DDD (degenerative disc disease) 03/18/2013   Metabolic syndrome 07/29/2012   Essential hypertension, benign 07/18/2012   Hyperlipidemia 07/18/2012   Obstructive sleep apnea 07/04/2012   Osteoporosis  07/04/2012   Morbid obesity (HCC) 07/04/2012    PCP: Lula Olszewski MD   REFERRING PROVIDER: Theodis Shove, DO   REFERRING DIAG: M54.9 (ICD-10-CM) - Dorsalgia, unspecified   THERAPY DIAG:  Pain in thoracic spine  Muscle weakness (generalized)  Rationale for Evaluation and Treatment: Rehabilitation  ONSET DATE: April 2023  SUBJECTIVE:   SUBJECTIVE STATEMENT:  Pt states that she will have random back pain with only aggravating. Pt states that within 5 mins of taking a break, it will go away. Pt feels like the "blood stops" into her legs with progressive weakness into the legs no pain. No tingling. Pt will notice leg weakness 2x/month with standing- no giving out. Denies B/B changes, saddle anesthesia, and sudden leg weakness     ASSESSMENT:   No treatment rendered this session. Pt would like to hold on therapy at this time in order to see vascular MD. No s/s of cauda equina or compression fracture based on subjective questioning. Plan to hold on therapy until pt gets further diagnostics performed.     Zebedee Iba, PT 12/03/2022, 11:20 AM

## 2022-12-05 ENCOUNTER — Other Ambulatory Visit: Payer: Self-pay | Admitting: *Deleted

## 2022-12-05 DIAGNOSIS — I7409 Other arterial embolism and thrombosis of abdominal aorta: Secondary | ICD-10-CM

## 2022-12-28 ENCOUNTER — Ambulatory Visit (HOSPITAL_COMMUNITY)
Admission: RE | Admit: 2022-12-28 | Discharge: 2022-12-28 | Disposition: A | Discharge status: Home / Self Care | Payer: Medicare Other | Source: Ambulatory Visit | Attending: Vascular Surgery | Admitting: Vascular Surgery

## 2022-12-28 ENCOUNTER — Ambulatory Visit (INDEPENDENT_AMBULATORY_CARE_PROVIDER_SITE_OTHER): Admission: RE | Admit: 2022-12-28 | Payer: Medicare Other | Source: Ambulatory Visit

## 2022-12-28 DIAGNOSIS — I7409 Other arterial embolism and thrombosis of abdominal aorta: Secondary | ICD-10-CM | POA: Insufficient documentation

## 2022-12-28 LAB — VAS US ABI WITH/WO TBI
Left ABI: 1.06
Right ABI: 1.23

## 2023-01-03 ENCOUNTER — Ambulatory Visit (INDEPENDENT_AMBULATORY_CARE_PROVIDER_SITE_OTHER): Payer: Medicare Other | Admitting: Vascular Surgery

## 2023-01-03 ENCOUNTER — Encounter: Payer: Self-pay | Admitting: Vascular Surgery

## 2023-01-03 VITALS — BP 99/64 | HR 75 | Temp 97.7°F | Resp 20 | Ht <= 58 in | Wt 131.0 lb

## 2023-01-03 DIAGNOSIS — I7409 Other arterial embolism and thrombosis of abdominal aorta: Secondary | ICD-10-CM

## 2023-01-03 DIAGNOSIS — I771 Stricture of artery: Secondary | ICD-10-CM | POA: Diagnosis not present

## 2023-01-03 NOTE — Progress Notes (Signed)
REASON FOR VISIT:   Follow-up of peripheral arterial disease  MEDICAL ISSUES:   PERIPHERAL ARTERIAL DISEASE: This patient is undergone previous aortobiiliac bypass grafting and has normal ABIs.  She has no claudication or rest pain.  I encouraged her to stay as active as possible.  She quit smoking in 2018.  She is on aspirin and is on a statin.  I think her leg weakness which occurs after she has been sitting for a long period of time is likely neurogenic in origin.  She is scheduled to see a back surgeon next week.  I have explained that I will be retiring so she will be seen on the PA schedule in 1 year.  I have ordered follow-up ABIs and a graft duplex.   S/P AORTO INNOMINATE BYPASS:  Her bypass graft is patent  a palpable radial pulse.  She has no symptoms.  She is on aspirin and a statin.    HPI:   Diane Giles is a pleasant 67 y.o. female who is well-known to me.  She underwent an aortobiiliac bypass graft in 2007.  She had developed some stenosis in the proximal right limb of her graft in 2018 underwent angioplasty and stenting with a VBX stent.  Most recently in January 2021 she underwent an aorto innominate bypass with an 8 mm Dacron graft.  She had been found to have an innominate artery stenosis with significant calcific disease in her arch and therefore was not a candidate for an endovascular approach.  Since I saw her last, her chief complaint is some weakness in her legs after she has been sitting for a long period of time.  She does have some back pain and is scheduled to see an orthopedic surgeon about her back next week.  I do not get any history of claudication, rest pain, or nonhealing ulcers.  She denies any history of stroke, TIAs, expressive or receptive aphasia, or amaurosis fugax.  She is on aspirin and is on a statin.  She quit smoking in 2018.  Past Medical History:  Diagnosis Date   Aortic stenosis    mild-moderate AS 04/22/19 echo   Aortoiliac occlusive  disease (HCC) 12/03/2016   Arthritis    Diabetes mellitus without complication (HCC)    per pt she is pre- diabetic   Diverticulitis    Gout    Heart murmur    Hyperlipidemia    Hypertension    Osteoporosis    Sleep apnea    Tobacco use disorder 07/04/2012    Family History  Problem Relation Age of Onset   Migraines Mother    Thyroid disease Mother        hypothyroid   Cancer Father 62       Dec age 55 with pancreatic Ca    SOCIAL HISTORY: Social History   Tobacco Use   Smoking status: Former    Current packs/day: 0.00    Average packs/day: 0.5 packs/day for 40.0 years (20.0 ttl pk-yrs)    Types: Cigarettes    Start date: 02/23/1977    Quit date: 02/23/2017    Years since quitting: 5.8   Smokeless tobacco: Never  Substance Use Topics   Alcohol use: Yes    Comment: very rare--1/month    Allergies  Allergen Reactions   Codeine Itching    Current Outpatient Medications  Medication Sig Dispense Refill   allopurinol (ZYLOPRIM) 100 MG tablet Take 300 mg by mouth in the morning.  aspirin EC 81 MG tablet Take 1 tablet (81 mg total) by mouth 2 (two) times daily. To be taken after surgery 84 tablet 0   atenolol (TENORMIN) 50 MG tablet Take 50 mg by mouth in the morning.     atorvastatin (LIPITOR) 80 MG tablet Take 1 tablet (80 mg total) by mouth daily at 6 PM. 90 tablet 3   buPROPion (WELLBUTRIN XL) 150 MG 24 hr tablet Take 150 mg by mouth every morning.     cefadroxil (DURICEF) 500 MG capsule Take 1 capsule (500 mg total) by mouth 2 (two) times daily. 20 capsule 0   docusate sodium (COLACE) 100 MG capsule Take 1 capsule (100 mg total) by mouth daily as needed. 30 capsule 2   ezetimibe (ZETIA) 10 MG tablet Take 1 tablet (10 mg total) by mouth daily. 90 tablet 3   HYDROcodone-acetaminophen (NORCO) 7.5-325 MG tablet Take 1-2 tablets by mouth every 6 (six) hours as needed for moderate pain. 40 tablet 0   Lancets MISC Use as directed once a day. (DX: R73.9) 100 each 2    magnesium oxide (MAG-OX) 400 MG tablet Take 400 mg by mouth every evening.     metFORMIN (GLUCOPHAGE) 500 MG tablet TAKE 2 TABLETS TWICE DAILY WITH MEALS. (Patient taking differently: Take 500 mg by mouth 2 (two) times daily with a meal.) 360 tablet 3   methocarbamol (ROBAXIN-750) 750 MG tablet Take 1 tablet (750 mg total) by mouth 2 (two) times daily as needed for muscle spasms. 20 tablet 2   olmesartan-hydrochlorothiazide (BENICAR HCT) 40-25 MG tablet Take 1 tablet by mouth every morning. 90 tablet 3   ondansetron (ZOFRAN) 4 MG tablet Take 1 tablet (4 mg total) by mouth daily as needed for nausea or vomiting. 30 tablet 1   oxyCODONE-acetaminophen (PERCOCET) 5-325 MG tablet Take 1-2 tablets by mouth every 8 (eight) hours as needed. 40 tablet 0   OZEMPIC, 0.25 OR 0.5 MG/DOSE, 2 MG/3ML SOPN Inject 0.25 mg into the skin once a week.     potassium chloride (KLOR-CON) 10 MEQ tablet Take 10 mEq by mouth 2 (two) times daily.     pregabalin (LYRICA) 75 MG capsule Take 75 mg by mouth 2 (two) times daily.     No current facility-administered medications for this visit.    REVIEW OF SYSTEMS:  [X]  denotes positive finding, [ ]  denotes negative finding Cardiac  Comments:  Chest pain or chest pressure:    Shortness of breath upon exertion:    Short of breath when lying flat:    Irregular heart rhythm:        Vascular    Pain in calf, thigh, or hip brought on by ambulation:    Pain in feet at night that wakes you up from your sleep:     Blood clot in your veins:    Leg swelling:         Pulmonary    Oxygen at home:    Productive cough:     Wheezing:         Neurologic    Sudden weakness in arms or legs:  x   Sudden numbness in arms or legs:     Sudden onset of difficulty speaking or slurred speech:    Temporary loss of vision in one eye:     Problems with dizziness:         Gastrointestinal    Blood in stool:     Vomited blood:  Genitourinary    Burning when urinating:     Blood  in urine:        Psychiatric    Major depression:         Hematologic    Bleeding problems:    Problems with blood clotting too easily:        Skin    Rashes or ulcers:        Constitutional    Fever or chills:     PHYSICAL EXAM:   There were no vitals filed for this visit.  GENERAL: The patient is a well-nourished female, in no acute distress. The vital signs are documented above. CARDIAC: There is a regular rate and rhythm.  VASCULAR: I do not detect carotid bruits. She has palpable radial pulses. She has palpable femoral pulses. I cannot palpate pedal pulses are both feet are warm and well-perfused. PULMONARY: There is good air exchange bilaterally without wheezing or rales. ABDOMEN: Soft and non-tender with normal pitched bowel sounds.  MUSCULOSKELETAL: There are no major deformities or cyanosis. NEUROLOGIC: No focal weakness or paresthesias are detected. SKIN: There are no ulcers or rashes noted. PSYCHIATRIC: The patient has a normal affect.  DATA:    ARTERIAL DOPPLER STUDY: I have reviewed her arterial Doppler study that was done on 12/28/2022.  On the right side she had a triphasic posterior tibial signal with a biphasic dorsalis pedis signal.  ABI was 100%.  Toe pressure was 75 mmHg.  On the left side she had a triphasic posterior tibial signal and a biphasic dorsalis pedis signal.  ABI was 100%.  Toe pressure was 79 mmHg.  AORTOILIAC DUPLEX: I viewed the aortoiliac duplex scan that was done also on 12/28/2022.  The aortobiiliac bypass graft was patent.  The stent in the right iliac limb was patent.  There were no areas of stenosis identified.  Waverly Ferrari [facility] [phone]

## 2023-01-10 ENCOUNTER — Encounter: Payer: Self-pay | Admitting: Cardiology

## 2023-01-10 NOTE — Progress Notes (Signed)
Labs 01/08/2023:  Total cholesterol 111, triglycerides 145, HDL 40, LDL 38.  Non-HDL cholesterol 61.  Serum glucose 80 mg, BUN 24, creatinine 1.45, EGFR 39 mL, potassium 4.8 LFTs normal.  Vitamin D 37.  Uric acid 2.2.

## 2023-01-11 ENCOUNTER — Ambulatory Visit (HOSPITAL_BASED_OUTPATIENT_CLINIC_OR_DEPARTMENT_OTHER): Payer: Medicare Other | Admitting: Orthopaedic Surgery

## 2023-01-11 DIAGNOSIS — M542 Cervicalgia: Secondary | ICD-10-CM

## 2023-01-11 DIAGNOSIS — R270 Ataxia, unspecified: Secondary | ICD-10-CM | POA: Diagnosis not present

## 2023-01-11 NOTE — Progress Notes (Signed)
Chief Complaint: Leg weakness     History of Present Illness:   01/11/2023; presents today with bilateral leg weakness.  She states that she has previously had a lumbar MRI but is continuing to experience broad-based gait with weakness in the lower extremities.  There is pain radiating down bilateral legs.  Diane Giles is a 67 y.o. female with history of right knee pain now going on for approximately 15 years.  She states that the knee feels sore and achy all over.  She states that she attempts to be active and is walking more than a mile a day at the gym at droppage although it starts to buckle and be painful after approximately more than 1 month.  She states that she has significant difficulty going from sit to stand.  She has pain even while at rest while sitting.  She is working significantly on trying to lose weight that she did gain from COVID.  She has lost 25 pounds recently.  She has previously had injections at [facility] which were many years prior.  These did not provide any type of significant relief.  She has taken over-the-counter anti-inflammatories with minimal relief as well.  She is here today seeking a more definitive solution.  She does have a history of diabetes with a last A1c of 6.3    Surgical History:   History of total hip arthroplasty done at [facility] many years prior  PMH/PSH/Family History/Social History/Meds/Allergies:    Past Medical History:  Diagnosis Date   Aortic stenosis    mild-moderate AS 04/22/19 echo   Aortoiliac occlusive disease (HCC) 12/03/2016   Arthritis    Diabetes mellitus without complication (HCC)    per pt she is pre- diabetic   Diverticulitis    Gout    Heart murmur    Hyperlipidemia    Hypertension    Osteoporosis    Sleep apnea    Tobacco use disorder 07/04/2012   Past Surgical History:  Procedure Laterality Date   ABDOMINAL AORTOGRAM W/LOWER EXTREMITY N/A 12/03/2016   Procedure:  Abdominal Aortogram w/Lower Extremity;  Surgeon: Chuck Hint, MD;  Location: [facility] INVASIVE CV LAB;  Service: Cardiovascular;  Laterality: N/A;   AORTA -INNOMIATE BYPASS N/A 05/18/2019   Procedure: AORTA -INNOMIATE BYPASS Using Hemashield Gold Graft Size 8mm;  Surgeon: Alleen Borne, MD;  [facility]: [facility] OR;  Service: Open Heart Surgery;  Laterality: N/A;   aorto- fem bypass Bilateral    aortobiiliac bypass in 2000   DILATATION & CURETTAGE/HYSTEROSCOPY WITH MYOSURE N/A 12/27/2015   Procedure: DILATATION & CURETTAGE/HYSTEROSCOPY;  Surgeon: Patton Salles, MD;  Location: [facility] ORS;  Service: Gynecology;  Laterality: N/A;   DILATION AND CURETTAGE OF UTERUS  11/2015   ELBOW SURGERY Left ~2007   tendon repair by Dr. Ranell Patrick   EYE SURGERY Bilateral 2008   lasik   JOINT REPLACEMENT     lower aortic bypass  2000   per patient   PERIPHERAL VASCULAR INTERVENTION  12/03/2016   Stenting of anastomotic stenosis of the aortoiliac and right common iliac artery with 7 x 39 mm VBX covered stent and postdilated with 9 mm balloon.   Right innonimate bypass  05/18/2019   Aorto innominate bypass surgery with median sternotomy   STERNOTOMY  05/18/2019   Right aorto-innonimate bypass surgery  05/18/19 Dr. Laneta Simmers   TOTAL HIP ARTHROPLASTY Right 07/27/2014   Procedure: RIGHT TOTAL HIP ARTHROPLASTY ANTERIOR APPROACH;  Surgeon: Durene Romans, MD;  Location: [facility] ORS;  Service: Orthopedics;  Laterality: Right;   TOTAL KNEE ARTHROPLASTY Right 05/28/2022   Procedure: RIGHT TOTAL KNEE ARTHROPLASTY;  Surgeon: Tarry Kos, MD;  Location: [facility] OR;  Service: Orthopedics;  Laterality: Right;   Social History   Socioeconomic History   Marital status: Married    Spouse name: Not on file   Number of children: 5   Years of education: Not on file   Highest education level: Not on file  Occupational History   Occupation: Tree surgeon  Tobacco Use   Smoking status: Former    Current packs/day: 0.00    Average  packs/day: 0.5 packs/day for 40.0 years (20.0 ttl pk-yrs)    Types: Cigarettes    Start date: 02/23/1977    Quit date: 02/23/2017    Years since quitting: 5.8   Smokeless tobacco: Never  Vaping Use   Vaping status: Never Used  Substance and Sexual Activity   Alcohol use: Yes    Comment: very rare--1/month   Drug use: No   Sexual activity: Yes    Partners: Male    Birth control/protection: Post-menopausal  Other Topics Concern   Not on file  Social History Narrative   Married. Patient does not exercise. She has a Geographical information systems officer.   Right handed   Lives with husband in two story home   Social Determinants of Health   Financial Resource Strain: Not on file  Food Insecurity: No Food Insecurity (05/29/2022)   Hunger Vital Sign    Worried About Running Out of Food in the Last Year: Never true    Ran Out of Food in the Last Year: Never true  Transportation Needs: No Transportation Needs (05/29/2022)   PRAPARE - Administrator, Civil Service (Medical): No    Lack of Transportation (Non-Medical): No  Physical Activity: Not on file  Stress: Not on file  Social Connections: Unknown (09/19/2021)   Received from [facility]   Social Network    Social Network: Not on file   Family History  Problem Relation Age of Onset   Migraines Mother    Thyroid disease Mother        hypothyroid   Cancer Father 24       Dec age 95 with pancreatic Ca   Allergies  Allergen Reactions   Codeine Itching   Current Outpatient Medications  Medication Sig Dispense Refill   allopurinol (ZYLOPRIM) 100 MG tablet Take 300 mg by mouth in the morning.     aspirin EC 81 MG tablet Take 1 tablet (81 mg total) by mouth 2 (two) times daily. To be taken after surgery 84 tablet 0   atenolol (TENORMIN) 50 MG tablet Take 50 mg by mouth in the morning.     atorvastatin (LIPITOR) 80 MG tablet Take 1 tablet (80 mg total) by mouth daily at 6 PM. 90 tablet 3   buPROPion (WELLBUTRIN XL) 150 MG 24 hr tablet  Take 150 mg by mouth every morning.     cefadroxil (DURICEF) 500 MG capsule Take 1 capsule (500 mg total) by mouth 2 (two) times daily. 20 capsule 0   docusate sodium (COLACE) 100 MG capsule Take 1 capsule (100 mg total) by mouth daily as needed. 30 capsule 2   ezetimibe (ZETIA) 10 MG tablet Take 1 tablet (10 mg total) by mouth daily.  90 tablet 3   HYDROcodone-acetaminophen (NORCO) 7.5-325 MG tablet Take 1-2 tablets by mouth every 6 (six) hours as needed for moderate pain. 40 tablet 0   Lancets MISC Use as directed once a day. (DX: R73.9) 100 each 2   magnesium oxide (MAG-OX) 400 MG tablet Take 400 mg by mouth every evening.     metFORMIN (GLUCOPHAGE) 500 MG tablet TAKE 2 TABLETS TWICE DAILY WITH MEALS. (Patient taking differently: Take 500 mg by mouth 2 (two) times daily with a meal.) 360 tablet 3   methocarbamol (ROBAXIN-750) 750 MG tablet Take 1 tablet (750 mg total) by mouth 2 (two) times daily as needed for muscle spasms. 20 tablet 2   olmesartan-hydrochlorothiazide (BENICAR HCT) 40-25 MG tablet Take 1 tablet by mouth every morning. 90 tablet 3   ondansetron (ZOFRAN) 4 MG tablet Take 1 tablet (4 mg total) by mouth daily as needed for nausea or vomiting. 30 tablet 1   oxyCODONE-acetaminophen (PERCOCET) 5-325 MG tablet Take 1-2 tablets by mouth every 8 (eight) hours as needed. 40 tablet 0   OZEMPIC, 0.25 OR 0.5 MG/DOSE, 2 MG/3ML SOPN Inject 0.25 mg into the skin once a week.     potassium chloride (KLOR-CON) 10 MEQ tablet Take 10 mEq by mouth 2 (two) times daily.     pregabalin (LYRICA) 75 MG capsule Take 75 mg by mouth 2 (two) times daily.     No current facility-administered medications for this visit.   No results found.  Review of Systems:   A ROS was performed including pertinent positives and negatives as documented in the HPI.  Physical Exam :   Constitutional: NAD and appears stated age Neurological: Alert and oriented Psych: Appropriate affect and cooperative Last menstrual  period 05/07/2005.   Comprehensive Musculoskeletal Exam:      Musculoskeletal Exam  Gait Normal  Alignment Normal   Right Left  Inspection Normal Normal  Palpation    Tenderness tricompartmental none  Crepitus positive None  Effusion Mild None  Range of Motion    Extension 0 0  Flexion 135 135  Strength    Extension 5/5 5/5  Flexion 5/5 5/5  Ligament Exam     Generalized Laxity No No  Lachman Negative Negative   Pivot Shift Negative Negative  Anterior Drawer Negative Negative  Valgus at 0 Negative Negative  Valgus at 20 Negative Negative  Varus at 0 0 0  Varus at 20   0 0  Posterior Drawer at 90 0 0  Vascular/Lymphatic Exam    Edema None None  Venous Stasis Changes No No  Distal Circulation Normal Normal  Neurologic    Light Touch Sensation Intact Intact  Special Tests:    Positive straight leg raise that is worse with forward flexion.  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   Assessment:   67 year old female with gait changes and pain and weakness going down bilateral legs which are positional in nature.  I did discuss that given her acute changes and weakness in bilateral lower extremities I would recommend an MRI of the thoracic and cervical spine  Plan :    -Plan for MRI cervical and thoracic spine     I personally saw and evaluated the patient, and participated in the management and treatment plan.  Huel Cote, MD Attending Physician, Orthopedic Surgery  This document was dictated using Dragon voice recognition software. A reasonable attempt at proof  reading has been made to minimize errors.

## 2023-01-18 ENCOUNTER — Other Ambulatory Visit: Payer: Self-pay

## 2023-01-18 ENCOUNTER — Other Ambulatory Visit: Payer: Self-pay | Admitting: Cardiology

## 2023-01-18 DIAGNOSIS — I7409 Other arterial embolism and thrombosis of abdominal aorta: Secondary | ICD-10-CM

## 2023-01-18 DIAGNOSIS — E7800 Pure hypercholesterolemia, unspecified: Secondary | ICD-10-CM

## 2023-01-18 DIAGNOSIS — I739 Peripheral vascular disease, unspecified: Secondary | ICD-10-CM

## 2023-01-23 LAB — LAB REPORT - SCANNED: EGFR: 47

## 2023-01-24 ENCOUNTER — Ambulatory Visit (HOSPITAL_BASED_OUTPATIENT_CLINIC_OR_DEPARTMENT_OTHER)
Admission: RE | Admit: 2023-01-24 | Discharge: 2023-01-24 | Disposition: A | Discharge status: Home / Self Care | Payer: Medicare Other | Source: Ambulatory Visit | Attending: Orthopaedic Surgery | Admitting: Orthopaedic Surgery

## 2023-01-24 DIAGNOSIS — R270 Ataxia, unspecified: Secondary | ICD-10-CM | POA: Insufficient documentation

## 2023-01-24 DIAGNOSIS — M542 Cervicalgia: Secondary | ICD-10-CM | POA: Insufficient documentation

## 2023-01-25 ENCOUNTER — Other Ambulatory Visit: Payer: Medicare Other

## 2023-01-28 ENCOUNTER — Other Ambulatory Visit: Payer: Self-pay | Admitting: Adult Health

## 2023-01-28 DIAGNOSIS — N179 Acute kidney failure, unspecified: Secondary | ICD-10-CM

## 2023-01-29 ENCOUNTER — Ambulatory Visit
Admission: RE | Admit: 2023-01-29 | Discharge: 2023-01-29 | Disposition: A | Discharge status: Home / Self Care | Payer: Medicare Other | Source: Ambulatory Visit | Attending: Adult Health | Admitting: Adult Health

## 2023-01-29 DIAGNOSIS — N179 Acute kidney failure, unspecified: Secondary | ICD-10-CM

## 2023-02-11 ENCOUNTER — Ambulatory Visit (HOSPITAL_BASED_OUTPATIENT_CLINIC_OR_DEPARTMENT_OTHER): Payer: Medicare Other | Admitting: Physical Therapy

## 2023-02-12 ENCOUNTER — Other Ambulatory Visit: Payer: Medicare Other

## 2023-02-13 ENCOUNTER — Ambulatory Visit (HOSPITAL_BASED_OUTPATIENT_CLINIC_OR_DEPARTMENT_OTHER): Payer: Medicare Other | Admitting: Orthopaedic Surgery

## 2023-02-13 DIAGNOSIS — M5430 Sciatica, unspecified side: Secondary | ICD-10-CM

## 2023-02-13 NOTE — Addendum Note (Signed)
Addended by: Jeanella Cara on: 02/13/2023 01:34 PM   Modules accepted: Orders

## 2023-02-13 NOTE — Progress Notes (Signed)
Chief Complaint: Leg weakness     History of Present Illness:   02/13/2023; Presents today for follow-up of her MRI.  Diane Giles is a 67 y.o. female with history of right knee pain now going on for approximately 15 years.  She states that the knee feels sore and achy all over.  She states that she attempts to be active and is walking more than a mile a day at the gym at [place]pp[place] although it starts to buckle and be painful after approximately more than 1 month.  She states that she has significant difficulty going from sit to stand.  She has pain even while at rest while sitting.  She is working significantly on trying to lose weight that she did gain from COVID.  She has lost 25 pounds recently.  She has previously had injections at [facility] which were many years prior.  These did not provide any type of significant relief.  She has taken over-the-counter anti-inflammatories with minimal relief as well.  She is here today seeking a more definitive solution.  She does have a history of diabetes with a last A1c of 6.3    Surgical History:   History of total hip arthroplasty done at [facility] many years prior  PMH/PSH/Family History/Social History/Meds/Allergies:    Past Medical History:  Diagnosis Date   Aortic stenosis    mild-moderate AS 04/22/19 echo   Aortoiliac occlusive disease (HCC) 12/03/2016   Arthritis    Diabetes mellitus without complication (HCC)    per pt she is pre- diabetic   Diverticulitis    Gout    Heart murmur    Hyperlipidemia    Hypertension    Osteoporosis    Sleep apnea    Tobacco use disorder 07/04/2012   Past Surgical History:  Procedure Laterality Date   ABDOMINAL AORTOGRAM W/LOWER EXTREMITY N/A 12/03/2016   Procedure: Abdominal Aortogram w/Lower Extremity;  Surgeon: Chuck Hint, MD;  Location: [facility] INVASIVE CV LAB;  Service: Cardiovascular;  Laterality: N/A;   AORTA -INNOMIATE BYPASS N/A 05/18/2019    Procedure: AORTA -INNOMIATE BYPASS Using Hemashield Gold Graft Size 8mm;  Surgeon: Alleen Borne, MD;  Location: [facility] OR;  Service: Open Heart Surgery;  Laterality: N/A;   aorto- fem bypass Bilateral    aortobiiliac bypass in 2000   DILATATION & CURETTAGE/HYSTEROSCOPY WITH MYOSURE N/A 12/27/2015   Procedure: DILATATION & CURETTAGE/HYSTEROSCOPY;  Surgeon: Patton Salles, MD;  Location: [facility] ORS;  Service: Gynecology;  Laterality: N/A;   DILATION AND CURETTAGE OF UTERUS  11/2015   ELBOW SURGERY Left ~2007   tendon repair by Dr. Ranell Patrick   EYE SURGERY Bilateral 2008   lasik   JOINT REPLACEMENT     lower aortic bypass  2000   per patient   PERIPHERAL VASCULAR INTERVENTION  12/03/2016   Stenting of anastomotic stenosis of the aortoiliac and right common iliac artery with 7 x 39 mm VBX covered stent and postdilated with 9 mm balloon.   Right innonimate bypass  05/18/2019   Aorto innominate bypass surgery with median sternotomy   STERNOTOMY  05/18/2019   Right aorto-innonimate bypass surgery 05/18/19 Dr. Laneta Simmers   TOTAL HIP ARTHROPLASTY Right 07/27/2014   Procedure: RIGHT TOTAL HIP ARTHROPLASTY ANTERIOR APPROACH;  Surgeon: Durene Romans, MD;  Location: [facility] ORS;  Service: Orthopedics;  Laterality: Right;   TOTAL KNEE ARTHROPLASTY Right 05/28/2022   Procedure: RIGHT TOTAL KNEE ARTHROPLASTY;  Surgeon: Tarry Kos, MD;  Location: [facility] OR;  Service: Orthopedics;  Laterality: Right;   Social History   Socioeconomic History   Marital status: Married    Spouse name: Not on file   Number of children: 5   Years of education: Not on file   Highest education level: Not on file  Occupational History   Occupation: Tree surgeon  Tobacco Use   Smoking status: Former    Current packs/day: 0.00    Average packs/day: 0.5 packs/day for 40.0 years (20.0 ttl pk-yrs)    Types: Cigarettes    Start date: 02/23/1977    Quit date: 02/23/2017    Years since quitting: 5.9   Smokeless tobacco: Never   Vaping Use   Vaping status: Never Used  Substance and Sexual Activity   Alcohol use: Yes    Comment: very rare--1/month   Drug use: No   Sexual activity: Yes    Partners: Male    Birth control/protection: Post-menopausal  Other Topics Concern   Not on file  Social History Narrative   Married. Patient does not exercise. She has a Geographical information systems officer.   Right handed   Lives with husband in two story home   Social Determinants of Health   Financial Resource Strain: Not on file  Food Insecurity: No Food Insecurity (05/29/2022)   Hunger Vital Sign    Worried About Running Out of Food in the Last Year: Never true    Ran Out of Food in the Last Year: Never true  Transportation Needs: No Transportation Needs (05/29/2022)   PRAPARE - Administrator, Civil Service (Medical): No    Lack of Transportation (Non-Medical): No  Physical Activity: Not on file  Stress: Not on file  Social Connections: Unknown (09/19/2021)   Received from [facility]   Social Network    Social Network: Not on file   Family History  Problem Relation Age of Onset   Migraines Mother    Thyroid disease Mother        hypothyroid   Cancer Father 54       Dec age 56 with pancreatic Ca   Allergies  Allergen Reactions   Codeine Itching   Current Outpatient Medications  Medication Sig Dispense Refill   allopurinol (ZYLOPRIM) 100 MG tablet Take 100 mg by mouth in the morning.     aspirin EC 81 MG tablet Take 1 tablet (81 mg total) by mouth 2 (two) times daily. To be taken after surgery (Patient taking differently: Take 81 mg by mouth daily.) 84 tablet 0   atenolol (TENORMIN) 50 MG tablet Take 50 mg by mouth in the morning.     atorvastatin (LIPITOR) 80 MG tablet Take 1 tablet (80 mg total) by mouth daily at 6 PM. 90 tablet 3   buPROPion (WELLBUTRIN XL) 150 MG 24 hr tablet Take 150 mg by mouth every morning.     cefadroxil (DURICEF) 500 MG capsule Take 1 capsule (500 mg total) by mouth 2  (two) times daily. (Patient not taking: Reported on 02/11/2023) 20 capsule 0   docusate sodium (COLACE) 100 MG capsule Take 1 capsule (100 mg total) by mouth daily as needed. (Patient not taking: Reported on 02/11/2023) 30 capsule 2   ezetimibe (ZETIA) 10 MG tablet Take 1 tablet (10 mg total) by mouth daily. 90 tablet 3   HYDROcodone-acetaminophen (NORCO) 7.5-325 MG  tablet Take 1-2 tablets by mouth every 6 (six) hours as needed for moderate pain. (Patient not taking: Reported on 02/11/2023) 40 tablet 0   Lancets MISC Use as directed once a day. (DX: R73.9) 100 each 2   metFORMIN (GLUCOPHAGE) 500 MG tablet TAKE 2 TABLETS TWICE DAILY WITH MEALS. (Patient not taking: Reported on 02/11/2023) 360 tablet 3   methocarbamol (ROBAXIN-750) 750 MG tablet Take 1 tablet (750 mg total) by mouth 2 (two) times daily as needed for muscle spasms. (Patient not taking: Reported on 02/11/2023) 20 tablet 2   olmesartan-hydrochlorothiazide (BENICAR HCT) 40-25 MG tablet Take 1 tablet by mouth every morning. (Patient not taking: Reported on 02/11/2023) 90 tablet 3   ondansetron (ZOFRAN) 4 MG tablet Take 1 tablet (4 mg total) by mouth daily as needed for nausea or vomiting. (Patient not taking: Reported on 02/11/2023) 30 tablet 1   oxyCODONE-acetaminophen (PERCOCET) 5-325 MG tablet Take 1-2 tablets by mouth every 8 (eight) hours as needed. (Patient not taking: Reported on 02/11/2023) 40 tablet 0   OZEMPIC, 0.25 OR 0.5 MG/DOSE, 2 MG/3ML SOPN Inject 0.5 mg into the skin once a week.     pregabalin (LYRICA) 75 MG capsule Take 75 mg by mouth 2 (two) times daily.     No current facility-administered medications for this visit.   No results found.  Review of Systems:   A ROS was performed including pertinent positives and negatives as documented in the HPI.  Physical Exam :   Constitutional: NAD and appears stated age Neurological: Alert and oriented Psych: Appropriate affect and cooperative Last menstrual period 05/07/2005.    Comprehensive Musculoskeletal Exam:      Musculoskeletal Exam  Gait Normal  Alignment Normal   Right Left  Inspection Normal Normal  Palpation    Tenderness tricompartmental none  Crepitus positive None  Effusion Mild None  Range of Motion    Extension 0 0  Flexion 135 135  Strength    Extension 5/5 5/5  Flexion 5/5 5/5  Ligament Exam     Generalized Laxity No No  Lachman Negative Negative   Pivot Shift Negative Negative  Anterior Drawer Negative Negative  Valgus at 0 Negative Negative  Valgus at 20 Negative Negative  Varus at 0 0 0  Varus at 20   0 0  Posterior Drawer at 90 0 0  Vascular/Lymphatic Exam    Edema None None  Venous Stasis Changes No No  Distal Circulation Normal Normal  Neurologic    Light Touch Sensation Intact Intact  Special Tests:    Positive straight leg raise that is worse with forward flexion.  Imaging:   Xray (4 views right knee): Moderate to severe tricompartmental osteoarthritis  MRI, cervical thoracic and lumbar spine: She does appear to have a moderate disc herniation involving the L5-S1 disc space  I personally reviewed and interpreted the radiographs.   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  I did discuss that overall I would recommend referral to [facility] imaging for an epidural injection at this level.  Will plan to proceed with this.  I will see her back in 3 months for reassessment  Plan :    -Return to clinic in 3 months for reassessment     I personally saw and evaluated the patient, and participated in the management and treatment plan.  Huel Cote, MD Attending Physician, Orthopedic Surgery  This document was dictated using Dragon voice recognition software. A reasonable attempt at proof reading has been  made to minimize errors.

## 2023-02-13 NOTE — Addendum Note (Signed)
Addended by: Jeanella Cara on: 02/13/2023 01:29 PM   Modules accepted: Orders

## 2023-02-17 ENCOUNTER — Other Ambulatory Visit: Payer: Self-pay | Admitting: Cardiology

## 2023-02-17 DIAGNOSIS — I10 Essential (primary) hypertension: Secondary | ICD-10-CM

## 2023-02-19 NOTE — Discharge Instructions (Signed)
Post Procedure Spinal Discharge Instruction Sheet  You may resume a regular diet and any medications that you routinely take (including pain medications) unless otherwise noted by MD.  No driving day of procedure.  Light activity throughout the rest of the day.  Do not do any strenuous work, exercise, bending or lifting.  The day following the procedure, you can resume normal physical activity but you should refrain from exercising or physical therapy for at least three days thereafter.  You may apply ice to the injection site, 20 minutes on, 20 minutes off, as needed. Do not apply ice directly to skin.    Common Side Effects:  Headaches- take your usual medications as directed by your physician.  Increase your fluid intake.  Caffeinated beverages may be helpful.  Lie flat in bed until your headache resolves.  Restlessness or inability to sleep- you may have trouble sleeping for the next few days.  Ask your referring physician if you need any medication for sleep.  Facial flushing or redness- should subside within a few days.  Increased pain- a temporary increase in pain a day or two following your procedure is not unusual.  Take your pain medication as prescribed by your referring physician.  Leg cramps  Please contact our office at [phone] for the following symptoms: Fever greater than 100 degrees. Headaches unresolved with medication after 2-3 days. Increased swelling, pain, or redness at injection site.   Thank you for visiting [facility] today.   YOU MAY RESUME YOUR ASPIRIN ANYTIME AFTER PROCEDURE TODAY

## 2023-02-20 ENCOUNTER — Ambulatory Visit
Admission: RE | Admit: 2023-02-20 | Discharge: 2023-02-20 | Disposition: A | Discharge status: Home / Self Care | Payer: Medicare Other | Source: Ambulatory Visit | Attending: Orthopaedic Surgery | Admitting: Orthopaedic Surgery

## 2023-02-20 DIAGNOSIS — M5430 Sciatica, unspecified side: Secondary | ICD-10-CM

## 2023-02-20 MED ORDER — IOPAMIDOL (ISOVUE-M 200) INJECTION 41%
1.0000 mL | Freq: Once | INTRAMUSCULAR | Status: AC
  Administered 2023-02-20: 1 mL via EPIDURAL

## 2023-02-20 MED ORDER — METHYLPREDNISOLONE ACETATE 40 MG/ML INJ SUSP (RADIOLOG
80.0000 mg | Freq: Once | INTRAMUSCULAR | Status: AC
  Administered 2023-02-20: 80 mg via EPIDURAL

## 2023-02-20 NOTE — Pre-Procedure Instructions (Signed)
Surgical Instructions   Your procedure is scheduled on March 04, 2023. Report to [facility] Main Entrance "A" at 7:15 A.M., then check in with the Admitting office. Any questions or running late day of surgery: call [phone]  Questions prior to your surgery date: call [phone], Monday-Friday, 8am-4pm. If you experience any cold or flu symptoms such as cough, fever, chills, shortness of breath, etc. between now and your scheduled surgery, please notify us at the above number.     Remember:  Do not eat after midnight the night before your surgery  You may drink clear liquids until 6:45 AM the morning of your surgery.   Clear liquids allowed are: Water, Non-Citrus Juices (without pulp), Carbonated Beverages, Clear Tea, Black Coffee Only (NO MILK, CREAM OR POWDERED CREAMER of any kind), and Gatorade.  Patient Instructions  The night before surgery:  No food after midnight. ONLY clear liquids after midnight  The day of surgery (if you have diabetes): Drink ONE (1) 12 oz G2 given to you in your pre admission testing appointment by 6:45 AM the morning of surgery. Drink in one sitting. Do not sip.  This drink was given to you during your hospital  pre-op appointment visit.  Nothing else to drink after completing the  12 oz bottle of G2.         If you have questions, please contact your surgeon's office.     Take these medicines the morning of surgery with A SIP OF WATER: allopurinol (ZYLOPRIM)  atenolol (TENORMIN)  buPROPion (WELLBUTRIN XL)  ezetimibe (ZETIA)  pregabalin (LYRICA)    Follow your surgeon's instructions on when to stop Asprin.  If no instructions were given by your surgeon then you will need to call the office to get those instructions.     One week prior to surgery, STOP taking any Aleve, Naproxen, Ibuprofen, Motrin, Advil, Goody's, BC's, all herbal medications, fish oil, and non-prescription vitamins.   WHAT DO I DO ABOUT MY DIABETES  MEDICATION?   STOP taking your OZEMPIC one week prior to surgery. DO NOT take any doses after October 20th.      HOW TO MANAGE YOUR DIABETES BEFORE AND AFTER SURGERY  Why is it important to control my blood sugar before and after surgery? Improving blood sugar levels before and after surgery helps healing and can limit problems. A way of improving blood sugar control is eating a healthy diet by:  Eating less sugar and carbohydrates  Increasing activity/exercise  Talking with your doctor about reaching your blood sugar goals High blood sugars (greater than 180 mg/dL) can raise your risk of infections and slow your recovery, so you will need to focus on controlling your diabetes during the weeks before surgery. Make sure that the doctor who takes care of your diabetes knows about your planned surgery including the date and location.  How do I manage my blood sugar before surgery? Check your blood sugar at least 4 times a day, starting 2 days before surgery, to make sure that the level is not too high or low.  Check your blood sugar the morning of your surgery when you wake up and every 2 hours until you get to the Short Stay unit.  If your blood sugar is less than 70 mg/dL, you will need to treat for low blood sugar: Do not take insulin. Treat a low blood sugar (less than 70 mg/dL) with  cup of clear juice (cranberry or apple), 4 glucose tablets, OR glucose gel. Recheck  blood sugar in 15 minutes after treatment (to make sure it is greater than 70 mg/dL). If your blood sugar is not greater than 70 mg/dL on recheck, call [phone] for further instructions. Report your blood sugar to the short stay nurse when you get to Short Stay.  If you are admitted to the hospital after surgery: Your blood sugar will be checked by the staff and you will probably be given insulin after surgery (instead of oral diabetes medicines) to make sure you have good blood sugar levels. The goal for blood sugar  control after surgery is 80-180 mg/dL.                      Do NOT Smoke (Tobacco/Vaping) for 24 hours prior to your procedure.  If you use a CPAP at night, you may bring your mask/headgear for your overnight stay.   You will be asked to remove any contacts, glasses, piercing's, hearing aid's, dentures/partials prior to surgery. Please bring cases for these items if needed.    Patients discharged the day of surgery will not be allowed to drive home, and someone needs to stay with them for 24 hours.  SURGICAL WAITING ROOM VISITATION Patients may have no more than 2 support people in the waiting area - these visitors may rotate.   Pre-op nurse will coordinate an appropriate time for 1 ADULT support person, who may not rotate, to accompany patient in pre-op.  Children under the age of 16 must have an adult with them who is not the patient and must remain in the main waiting area with an adult.  If the patient needs to stay at the hospital during part of their recovery, the visitor guidelines for inpatient rooms apply.  Please refer to the [facility] website for the visitor guidelines for any additional information.   If you received a COVID test during your pre-op visit  it is requested that you wear a mask when out in public, stay away from anyone that may not be feeling well and notify your surgeon if you develop symptoms. If you have been in contact with anyone that has tested positive in the last 10 days please notify you surgeon.      Pre-operative 5 CHG Bathing Instructions   You can play a key role in reducing the risk of infection after surgery. Your skin needs to be as free of germs as possible. You can reduce the number of germs on your skin by washing with CHG (chlorhexidine gluconate) soap before surgery. CHG is an antiseptic soap that kills germs and continues to kill germs even after washing.   DO NOT use if you have an allergy to chlorhexidine/CHG or antibacterial soaps.  If your skin becomes reddened or irritated, stop using the CHG and notify one of our RNs at [phone].   Please shower with the CHG soap starting 4 days before surgery using the following schedule:     Please keep in mind the following:  DO NOT shave, including legs and underarms, starting the day of your first shower.   You may shave your face at any point before/day of surgery.  Place clean sheets on your bed the day you start using CHG soap. Use a clean washcloth (not used since being washed) for each shower. DO NOT sleep with pets once you start using the CHG.   CHG Shower Instructions:  Wash your face and private area with normal soap. If you choose to wash your  hair, wash first with your normal shampoo.  After you use shampoo/soap, rinse your hair and body thoroughly to remove shampoo/soap residue.  Turn the water OFF and apply about 3 tablespoons (45 ml) of CHG soap to a CLEAN washcloth.  Apply CHG soap ONLY FROM YOUR NECK DOWN TO YOUR TOES (washing for 3-5 minutes)  DO NOT use CHG soap on face, private areas, open wounds, or sores.  Pay special attention to the area where your surgery is being performed.  If you are having back surgery, having someone wash your back for you may be helpful. Wait 2 minutes after CHG soap is applied, then you may rinse off the CHG soap.  Pat dry with a clean towel  Put on clean clothes/pajamas   If you choose to wear lotion, please use ONLY the CHG-compatible lotions on the back of this paper.   Additional instructions for the day of surgery: DO NOT APPLY any lotions, deodorants, cologne, or perfumes.   Do not bring valuables to the hospital. [facility] is not responsible for any belongings/valuables. Do not wear nail polish, gel polish, artificial nails, or any other type of covering on natural nails (fingers and toes) Do not wear jewelry or makeup Put on clean/comfortable clothes.  Please brush your teeth.  Ask your nurse before applying  any prescription medications to the skin.     CHG Compatible Lotions   Aveeno Moisturizing lotion  Cetaphil Moisturizing Cream  Cetaphil Moisturizing Lotion  Clairol Herbal Essence Moisturizing Lotion, Dry Skin  Clairol Herbal Essence Moisturizing Lotion, Extra Dry Skin  Clairol Herbal Essence Moisturizing Lotion, Normal Skin  Curel Age Defying Therapeutic Moisturizing Lotion with Alpha Hydroxy  Curel Extreme Care Body Lotion  Curel Soothing Hands Moisturizing Hand Lotion  Curel Therapeutic Moisturizing Cream, Fragrance-Free  Curel Therapeutic Moisturizing Lotion, Fragrance-Free  Curel Therapeutic Moisturizing Lotion, Original Formula  Eucerin Daily Replenishing Lotion  Eucerin Dry Skin Therapy Plus Alpha Hydroxy Crme  Eucerin Dry Skin Therapy Plus Alpha Hydroxy Lotion  Eucerin Original Crme  Eucerin Original Lotion  Eucerin Plus Crme Eucerin Plus Lotion  Eucerin TriLipid Replenishing Lotion  Keri Anti-Bacterial Hand Lotion  Keri Deep Conditioning Original Lotion Dry Skin Formula Softly Scented  Keri Deep Conditioning Original Lotion, Fragrance Free Sensitive Skin Formula  Keri Lotion Fast Absorbing Fragrance Free Sensitive Skin Formula  Keri Lotion Fast Absorbing Softly Scented Dry Skin Formula  Keri Original Lotion  Keri Skin Renewal Lotion Keri Silky Smooth Lotion  Keri Silky Smooth Sensitive Skin Lotion  Nivea Body Creamy Conditioning Oil  Nivea Body Extra Enriched Lotion  Nivea Body Original Lotion  Nivea Body Sheer Moisturizing Lotion Nivea Crme  Nivea Skin Firming Lotion  NutraDerm 30 Skin Lotion  NutraDerm Skin Lotion  NutraDerm Therapeutic Skin Cream  NutraDerm Therapeutic Skin Lotion  ProShield Protective Hand Cream  Provon moisturizing lotion  Please read over the following fact sheets that you were given.

## 2023-02-21 ENCOUNTER — Other Ambulatory Visit: Payer: Self-pay

## 2023-02-21 ENCOUNTER — Encounter (HOSPITAL_COMMUNITY)
Admission: RE | Admit: 2023-02-21 | Discharge: 2023-02-21 | Disposition: A | Discharge status: Home / Self Care | Payer: Medicare Other | Source: Ambulatory Visit | Attending: Orthopaedic Surgery | Admitting: Orthopaedic Surgery

## 2023-02-21 ENCOUNTER — Encounter (HOSPITAL_COMMUNITY): Payer: Self-pay

## 2023-02-21 VITALS — BP 118/57 | Temp 97.9°F | Ht 59.0 in | Wt 132.7 lb

## 2023-02-21 DIAGNOSIS — M1712 Unilateral primary osteoarthritis, left knee: Secondary | ICD-10-CM | POA: Diagnosis not present

## 2023-02-21 DIAGNOSIS — E119 Type 2 diabetes mellitus without complications: Secondary | ICD-10-CM | POA: Diagnosis not present

## 2023-02-21 DIAGNOSIS — Z01818 Encounter for other preprocedural examination: Secondary | ICD-10-CM | POA: Diagnosis present

## 2023-02-21 LAB — CBC
HCT: 32.6 % — ABNORMAL LOW (ref 36.0–46.0)
Hemoglobin: 10.6 g/dL — ABNORMAL LOW (ref 12.0–15.0)
MCH: 30.5 pg (ref 26.0–34.0)
MCHC: 32.5 g/dL (ref 30.0–36.0)
MCV: 93.9 fL (ref 80.0–100.0)
Platelets: 244 10*3/uL (ref 150–400)
RBC: 3.47 MIL/uL — ABNORMAL LOW (ref 3.87–5.11)
RDW: 14.3 % (ref 11.5–15.5)
WBC: 8.1 10*3/uL (ref 4.0–10.5)
nRBC: 0 % (ref 0.0–0.2)

## 2023-02-21 LAB — BASIC METABOLIC PANEL
Anion gap: 9 (ref 5–15)
BUN: 22 mg/dL (ref 8–23)
CO2: 25 mmol/L (ref 22–32)
Calcium: 9.6 mg/dL (ref 8.9–10.3)
Chloride: 103 mmol/L (ref 98–111)
Creatinine, Ser: 1.19 mg/dL — ABNORMAL HIGH (ref 0.44–1.00)
GFR, Estimated: 49 mL/min — ABNORMAL LOW (ref >60)
Glucose, Bld: 98 mg/dL (ref 70–99)
Potassium: 4.3 mmol/L (ref 3.5–5.1)
Sodium: 137 mmol/L (ref 135–145)

## 2023-02-21 LAB — SURGICAL PCR SCREEN
MRSA, PCR: NEGATIVE
Staphylococcus aureus: NEGATIVE

## 2023-02-21 LAB — HEMOGLOBIN A1C
Hgb A1c MFr Bld: 5.6 % (ref 4.8–5.6)
Mean Plasma Glucose: 114.02 mg/dL

## 2023-02-21 LAB — GLUCOSE, CAPILLARY: Glucose-Capillary: 110 mg/dL — ABNORMAL HIGH (ref 70–99)

## 2023-02-21 NOTE — Progress Notes (Addendum)
PCP - Dr. Lula Olszewski Cardiologist - Dr. Yates Decamp- last visit 02/21/22- upcoming appt 02/25/23 at 1355   PPM/ICD - denies   Chest x-ray - 12/27/20 EKG - 02/21/23 Stress Test - denies ECHO - 12/28/20 Cardiac Cath - denies  Sleep Study - OSA+ CPAP - nightly, pressure setting 10  DM- pt states that she was told she is no longer diabetic and was taken off of everything except Ozempic. She does not check her CBG at home and does not know her typical fasting levels   Pt knows that her last dose of Ozempic will be on 10/20   Blood Thinner Instructions: n/a Aspirin Instructions: f/u with surgeon  ERAS Protcol - yes PRE-SURGERY G2- given at PAT  COVID TEST- n/a   Anesthesia review: yes, cardiac hx  Patient denies shortness of breath, fever, cough and chest pain at PAT appointment   All instructions explained to the patient, with a verbal understanding of the material. Patient agrees to go over the instructions while at home for a better understanding. The opportunity to ask questions was provided.

## 2023-02-22 NOTE — Plan of Care (Signed)
CHL Tonsillectomy/Adenoidectomy, Postoperative PEDS care plan entered in error.

## 2023-02-24 NOTE — Progress Notes (Unsigned)
Cardiology Office Note    Patient Name: Diane Giles Date of Encounter: 02/24/2023  Primary Care Provider:  Lula Olszewski, MD Primary Cardiologist:  None Primary Electrophysiologist: None   Past Medical History    Past Medical History:  Diagnosis Date   Aortic stenosis    mild-moderate AS 04/22/19 echo   Aortoiliac occlusive disease (HCC) 12/03/2016   Arthritis    Diabetes mellitus without complication (HCC)    pt states she has been told she is no longer diabetic and is not taking meds   Diverticulitis    Gout    Heart murmur    Hyperlipidemia    Hypertension    Osteoporosis    Sleep apnea    Tobacco use disorder 07/04/2012    History of Present Illness  Diane Giles is a 67 y.o. female with a PMH of PAD s/p aortoiliac bypass 07/1998 and aortoiliac stenting in 2018, with aorto innominate bypass 2021, aortic stenosis, HTN, HLD, mild nonobstructive CAD, DM type II, prior tobacco abuse who presents today for 1 year follow-up.  Diane Giles is a history significant for PAD aortoiliac bypass in 2000 with subsequent stenting in 2018.  She was noted to have fatigue with exertion and carotid bruits on evaluation in 2020.  She underwent carotid Dopplers revealing innominate artery stenosis.  She completed coronary CT that showed a high degree of stenosis in the right innominate artery and also showed mild nonobstructive CAD.  She was evaluated by CVTS and underwent a innominate artery bypass repair with median sternotomy with Dr. Laneta Simmers and Edilia Bo in 05/2019.  She was seen in the ED on 12/2020 with complaint of chest pain.  She was found to be mildly hypertensive and EKG showed sinus rhythm.  She had a 2D echo completed that showed hyperdynamic EF of 70 to 75% with LV diastolic dysfunction and no RWMA.  She was seen initially by Dr. Jacinto Halim on 01/03/2022 and losartan was discontinued along with Lasix and patient was switched to olmesartan/HCT.  She was last seen on 02/21/2022 in follow-up  and was noted to have excellent control of her BP.    During today's visit the patient reports*** .  Patient denies chest pain, palpitations, dyspnea, PND, orthopnea, nausea, vomiting, dizziness, syncope, edema, weight gain, or early satiety.  ***Notes: -Last ischemic evaluation: -Last echo: -Interim ED visits: Review of Systems  Please see the history of present illness.    All other systems reviewed and are otherwise negative except as noted above.  Physical Exam    Wt Readings from Last 3 Encounters:  02/21/23 132 lb 11.2 oz (60.2 kg)  01/03/23 131 lb (59.4 kg)  05/28/22 170 lb (77.1 kg)   WU:JWJXB were no vitals filed for this visit.,There is no height or weight on file to calculate BMI. GEN: Well nourished, well developed in no acute distress Neck: No JVD; No carotid bruits Pulmonary: Clear to auscultation without rales, wheezing or rhonchi  Cardiovascular: Normal rate. Regular rhythm. Normal S1. Normal S2.   Murmurs: There is no murmur.  ABDOMEN: Soft, non-tender, non-distended EXTREMITIES:  No edema; No deformity   EKG/LABS/ Recent Cardiac Studies   ECG personally reviewed by me today - ***  Risk Assessment/Calculations:   {Does this patient have ATRIAL FIBRILLATION?[phone]}      Lab Results  Component Value Date   WBC 8.1 02/21/2023   HGB 10.6 (L) 02/21/2023   HCT 32.6 (L) 02/21/2023   MCV 93.9 02/21/2023   PLT 244 02/21/2023  Lab Results  Component Value Date   CREATININE 1.19 (H) 02/21/2023   BUN 22 02/21/2023   NA 137 02/21/2023   K 4.3 02/21/2023   CL 103 02/21/2023   CO2 25 02/21/2023   Lab Results  Component Value Date   CHOL 185 02/22/2022   HDL 46 02/22/2022   LDLCALC 107 (H) 02/22/2022   TRIG 183 (H) 02/22/2022   CHOLHDL 3.7 12/28/2020    Lab Results  Component Value Date   HGBA1C 5.6 02/21/2023   Assessment & Plan    1.  Essential hypertension: -Patient's last blood pressure was***  2.  Hyperlipidemia: -Patient's last LDL  cholesterol was***  3.  History of PAD: -s/p aortoiliac bypass in 2000 with subsequent stenting in 2018, aorto innominate bypass 05/2019   4.  DM type II: -Patient's last hemoglobin A1c was***  5.  History of nonobstructive CAD: -Coronary CTA completed 2020 showing minimal nonobstructive CAD present.      Disposition: Follow-up with None or APP in *** months {Are you ordering a CV Procedure (e.g. stress test, cath, DCCV, TEE, etc)?   Press F2        :[id number]}   Signed, Napoleon Form, Leodis Rains, NP 02/24/2023, 2:28 PM [facility]

## 2023-02-25 ENCOUNTER — Ambulatory Visit: Payer: Medicare Other | Attending: Cardiology | Admitting: Nurse Practitioner

## 2023-02-25 ENCOUNTER — Encounter: Payer: Self-pay | Admitting: Nurse Practitioner

## 2023-02-25 ENCOUNTER — Other Ambulatory Visit: Payer: Self-pay | Admitting: Physician Assistant

## 2023-02-25 ENCOUNTER — Ambulatory Visit: Payer: Self-pay | Admitting: Cardiology

## 2023-02-25 VITALS — BP 140/86 | HR 74 | Ht 59.0 in | Wt 133.6 lb

## 2023-02-25 DIAGNOSIS — Z01810 Encounter for preprocedural cardiovascular examination: Secondary | ICD-10-CM | POA: Diagnosis present

## 2023-02-25 DIAGNOSIS — I2510 Atherosclerotic heart disease of native coronary artery without angina pectoris: Secondary | ICD-10-CM | POA: Diagnosis present

## 2023-02-25 DIAGNOSIS — I10 Essential (primary) hypertension: Secondary | ICD-10-CM | POA: Diagnosis present

## 2023-02-25 DIAGNOSIS — E1165 Type 2 diabetes mellitus with hyperglycemia: Secondary | ICD-10-CM | POA: Diagnosis present

## 2023-02-25 DIAGNOSIS — I739 Peripheral vascular disease, unspecified: Secondary | ICD-10-CM

## 2023-02-25 DIAGNOSIS — E7800 Pure hypercholesterolemia, unspecified: Secondary | ICD-10-CM | POA: Diagnosis not present

## 2023-02-25 MED ORDER — DOXYCYCLINE HYCLATE 100 MG PO CAPS: 100.0000 mg | ORAL_CAPSULE | Freq: Two times a day (BID) | ORAL | 0 refills | Status: DC

## 2023-02-25 MED ORDER — DOCUSATE SODIUM 100 MG PO CAPS: 100.0000 mg | ORAL_CAPSULE | Freq: Every day | ORAL | 2 refills | Status: DC | PRN

## 2023-02-25 MED ORDER — ASPIRIN 81 MG PO TBEC: 81.0000 mg | DELAYED_RELEASE_TABLET | Freq: Two times a day (BID) | ORAL | 0 refills | Status: AC

## 2023-02-25 MED ORDER — OXYCODONE-ACETAMINOPHEN 5-325 MG PO TABS: 1.0000 | ORAL_TABLET | Freq: Four times a day (QID) | ORAL | 0 refills | Status: DC | PRN

## 2023-02-25 MED ORDER — ONDANSETRON HCL 4 MG PO TABS
4.0000 mg | ORAL_TABLET | Freq: Every day | ORAL | 1 refills | Status: AC | PRN
Start: 2022-05-20 — End: 2023-05-20

## 2023-02-25 MED ORDER — METHOCARBAMOL 750 MG PO TABS
750.0000 mg | ORAL_TABLET | Freq: Two times a day (BID) | ORAL | 2 refills | Status: DC | PRN
Start: 2023-02-25 — End: 2023-07-13

## 2023-02-25 NOTE — Patient Instructions (Signed)
Medication Instructions:  Your physician recommends that you continue on your current medications as directed. Please refer to the Current Medication list given to you today. *If you need a refill on your cardiac medications before your next appointment, please call your pharmacy*   Lab Work: None Ordered   Testing/Procedures: None ordered   Follow-Up: At [facility], you and your health needs are our priority.  As part of our continuing mission to provide you with exceptional heart care, we have created designated Provider Care Teams.  These Care Teams include your primary Cardiologist (physician) and Advanced Practice Providers (APPs -  Physician Assistants and Nurse Practitioners) who all work together to provide you with the care you need, when you need it.  We recommend signing up for the patient portal called "MyChart".  Sign up information is provided on this After Visit Summary.  MyChart is used to connect with patients for Virtual Visits (Telemedicine).  Patients are able to view lab/test results, encounter notes, upcoming appointments, etc.  Non-urgent messages can be sent to your provider as well.   To learn more about what you can do with MyChart, go to [email or web address].    Your next appointment:   12 month(s)  Provider:   Yates Decamp, MD    Other Instructions

## 2023-02-25 NOTE — Anesthesia Preprocedure Evaluation (Addendum)
Anesthesia Evaluation  Patient identified by MRN, date of birth, ID band Patient awake    Reviewed: Allergy & Precautions, NPO status , Patient's Chart, lab work & pertinent test results, reviewed documented beta blocker date and time   History of Anesthesia Complications Negative for: history of anesthetic complications  Airway Mallampati: I  TM Distance: >3 FB Neck ROM: Full    Dental no notable dental hx. (+) Edentulous Upper, Edentulous Lower   Pulmonary sleep apnea and Continuous Positive Airway Pressure Ventilation , former smoker   Pulmonary exam normal breath sounds clear to auscultation       Cardiovascular hypertension, Pt. on medications and Pt. on home beta blockers (-) angina + CAD (mild non-obstructive) and + Peripheral Vascular Disease  Normal cardiovascular exam+ Valvular Problems/Murmurs (mild-mod AS) AS  Rhythm:Regular Rate:Normal  12/2020 Echo  1. Left ventricular ejection fraction, by estimation, is 70 to 75%. The  left ventricle has hyperdynamic function. The left ventricle has no  regional wall motion abnormalities. Left ventricular diastolic parameters  are indeterminate.   2. Right ventricular systolic function is normal. The right ventricular  size is normal. There is normal pulmonary artery systolic pressure.   3. The mitral valve is normal in structure. No evidence of mitral valve  regurgitation. No evidence of mitral stenosis.   4. The aortic valve is calcified. Aortic valve regurgitation is not  visualized. Mild aortic valve stenosis.       Neuro/Psych negative neurological ROS     GI/Hepatic Neg liver ROS,,,  Endo/Other  diabetes, Well Controlled, Type 2, Oral Hypoglycemic Agents    Renal/GU negative Renal ROS     Musculoskeletal  (+) Arthritis ,    Abdominal  (+) + obese (BMI 35.53)  Peds  Hematology negative hematology ROS (+)   Anesthesia Other Findings ALL: codeine   Reproductive/Obstetrics                             Anesthesia Physical Anesthesia Plan  ASA: 3  Anesthesia Plan: Spinal, Regional and MAC   Post-op Pain Management: Regional block* and Minimal or no pain anticipated   Induction:   PONV Risk Score and Plan: 3 and Treatment may vary due to age or medical condition, Midazolam, Ondansetron and Propofol infusion  Airway Management Planned: Nasal Cannula, Natural Airway and Simple Face Mask  Additional Equipment: None  Intra-op Plan:   Post-operative Plan:   Informed Consent: I have reviewed the patients History and Physical, chart, labs and discussed the procedure including the risks, benefits and alternatives for the proposed anesthesia with the patient or authorized representative who has indicated his/her understanding and acceptance.     Dental advisory given  Plan Discussed with: CRNA and Anesthesiologist  Anesthesia Plan Comments: (PAT note written 02/25/2023 by Shonna Chock, PA-C. DISCUSSION: Patient is a 67 year old female scheduled for the above procedure.   History includes former smoker (quit 02/23/17), HTN, aortoiliac occlusive disease (s/p aortobililac graft 2000; angioplasty/stent of right limb anastomosis 11/23/16), innominate artery stenosis (s/p median sternotomy for aorto-innominate bypass using 8 mm Dacron graft 05/18/19), murmur/AS (mild-moderate 04/2019, mild AS 12/2020), DM2, OSA (uses CPAP), osteoarthritis (right THA 07/27/14), skin cancer (s/p right lateral calf skin cancer excision 01/31/22), partial small bowel obstruction (related to adhesions 10/2021), osteoarthritis (right TKA 05/28/22).     She had cardiology evaluation with Neila Gear, NP on 02/25/23. She has a history of a coronary CT done 04/2019 prior to  innominate artery bypass that showed mild non-obstructive CAD (25-49% pLAD, 0-25% RCA). Echo done on 12/28/20 during chest pain admission showed LVEF 70-75%, no RWMA, mild AS.  On exam she had 2/6 SEM. She denied CV symptoms. She does have chronic LE edema. She is active and reported walking 1-2 miles/day and alternating upper and lower body exercises at the gym. For preoperative input he wrote, "Preoperative clearance -Patient's RCRI score is 0.9% -The patient affirms she has been doing well without any new cardiac symptoms. They are able to achieve 4 METS without cardiac limitations. Therefore, based on ACC/AHA guidelines, the patient would be at acceptable risk for the planned procedure without further cardiovascular testing." He gave permission to hold ASA for 7 days prior to surgery and resume post-operative when safe. 12 month follow-up planned.    DM well controlled with A1c 5.6%. She reported that she was taken off of metformin and is now only on weekly Ozempic. Last dose planned for 02/24/23 until after surgery.    Anesthesia team to evaluate on the day of surgery.    EKG: 02/21/23: NSR     CV: Aortoiliac Bypass Graft [place] Evaluation with ABIs 12/28/22: Summary:  - Patent aorta-iliac bypass graft with no visualized stenosis. The right  stent appears patent.  - Right: Resting right ankle-brachial index is within normal range. The  right toe-brachial index is abnormal.  - Left: Resting left ankle-brachial index is within normal range. The left  toe-brachial index is abnormal.      US Carotid 10/26/21: Summary:  - Right Carotid: Velocities in the right ICA are consistent with a 1-39% stenosis.  - Left Carotid: Velocities in the left ICA are consistent with a 1-39%  stenosis.  - Vertebrals: Bilateral vertebral arteries demonstrate antegrade flow.  - Subclavians: Normal flow hemodynamics were seen in bilateral subclavian arteries.      Echo 12/28/20: IMPRESSIONS   1. Left ventricular ejection fraction, by estimation, is 70 to 75%. The  left ventricle has hyperdynamic function. The left ventricle has no  regional wall motion abnormalities. Left ventricular  diastolic parameters  are indeterminate.   2. Right ventricular systolic function is normal. The right ventricular  size is normal. There is normal pulmonary artery systolic pressure.   3. The mitral valve is normal in structure. No evidence of mitral valve  regurgitation. No evidence of mitral stenosis.   4. The aortic valve is calcified. Aortic valve regurgitation is not  visualized. Mild aortic valve stenosis.  AV VTI:            0.541 m  AV Peak Grad:      20.6 mmHg  AV Mean Grad:      11.8 mmHg  - Comparison echo : LVEF 70-75%, mild basal septal hypertrophy, no RWMA, grade I DD, mild-moderate AS, AV mean gradient 14.7 mmHg. AV peak gradient 29.3 mmHg. AVA by VTI 1.29 cm     CT Coronary 04/17/19 (ordered by CT surgeon Evelene Croon, MD prior to innominate artery bypass): IMPRESSION: 1. Coronary calcium score of 140. This was 60 percentile for age and sex matched control. 2. Normal coronary origin with left dominance. 3. CAD-RADS 2. Mild non-obstructive CAD (25-49%) in the proximal LAD. Consider preventive therapy and risk factor modification. The LCX had only luminal irregularities. RCA is a small non-dominant artery that has mild calcified plaque with stenosis 0-25%.   )        Anesthesia Quick Evaluation

## 2023-02-25 NOTE — Progress Notes (Signed)
Anesthesia Chart Review:  Case: [id number] Date/Time: 03/04/23 0930   Procedure: TOTAL KNEE ARTHROPLASTY (Left: Knee)   Anesthesia type: Spinal   Pre-op diagnosis: left knee osteoarthritis   [facility]: [facility] ROOM 06 / [facility]   Surgeons: Tarry Kos, MD       DISCUSSION: Patient is a 67 year old female scheduled for the above procedure.     History includes former smoker (quit 02/23/17), HTN, aortoiliac occlusive disease (s/p aortobililac graft 2000; angioplasty/stent of right limb anastomosis 11/23/16), innominate artery stenosis (s/p median sternotomy for aorto-innominate bypass using 8 mm Dacron graft 05/18/19), murmur/AS (mild-moderate 04/2019, mild AS 12/2020), DM2, OSA (uses CPAP), osteoarthritis (right THA 07/27/14), skin cancer (s/p right lateral calf skin cancer excision 01/31/22), partial small bowel obstruction (related to adhesions 10/2021), osteoarthritis (right TKA 05/28/22).     She had cardiology evaluation with Neila Gear, NP on 02/25/23. She has a history of a coronary CT done 04/2019 prior to innominate artery bypass that showed mild non-obstructive CAD (25-49% pLAD, 0-25% RCA). Echo done on 12/28/20 during chest pain admission showed LVEF 70-75%, no RWMA, mild AS. On exam she had 2/6 SEM. She denied CV symptoms. She does have chronic LE edema. She is active and reported walking 1-2 miles/day and alternating upper and lower body exercises at the gym. For preoperative input he wrote, "Preoperative clearance -Patient's RCRI score is 0.9% -The patient affirms she has been doing well without any new cardiac symptoms. They are able to achieve 4 METS without cardiac limitations. Therefore, based on ACC/AHA guidelines, the patient would be at acceptable risk for the planned procedure without further cardiovascular testing." He gave permission to hold ASA for 7 days prior to surgery and resume post-operative when safe. 12 month follow-up planned.    DM well controlled with A1c 5.6%. She  reported that she was taken off of metformin and is now only on weekly Ozempic. Last dose planned for 02/24/23 until after surgery.    Anesthesia team to evaluate on the day of surgery.    VS: BP (!) 118/57   Temp 36.6 C (Oral)   Ht 4\' 11"  (1.499 m)   Wt 60.2 kg   LMP 05/07/2005 (Approximate)   SpO2 100%   BMI 26.80 kg/m    PROVIDERS: Lula Olszewski, MD is listed as PCP with [facility] in [place].  Yates Decamp, MD is cardiologist  Waverly Ferrari, MD (recently retired) is Physiological scientist. Last visit 01/03/23.    LABS: Labs reviewed: Acceptable for surgery. H/H 10.6/32.6, MCV 93.9, which is essentially unchanged from H/H post right TKA on 05/29/22. (Pre-op H/H for right TKA was 11.2/33.4.)  (all labs ordered are listed, but only abnormal results are displayed)  Labs Reviewed  GLUCOSE, CAPILLARY - Abnormal; Notable for the following components:      Result Value   Glucose-Capillary 110 (*)    All other components within normal limits  CBC - Abnormal; Notable for the following components:   RBC 3.47 (*)    Hemoglobin 10.6 (*)    HCT 32.6 (*)    All other components within normal limits  BASIC METABOLIC PANEL - Abnormal; Notable for the following components:   Creatinine, Ser 1.19 (*)    GFR, Estimated 49 (*)    All other components within normal limits  SURGICAL PCR SCREEN  HEMOGLOBIN A1C     IMAGES: Xray left knee 11/20/22: X-rays demonstrate advanced DJD of the left knee with bone-on-bone joint  space narrowing and mild  valgus deformity.   CT Chest LCS 10/16/22: IMPRESSION: 1. Lung-RADS 1S, negative. Continue annual screening with low-dose chest CT without contrast in 12 months. 2. The "S" modifier above refers to potentially clinically significant non lung cancer related findings. Specifically, there is aortic atherosclerosis, in addition to three-vessel coronary artery disease. Please note that although the presence of coronary artery calcium documents  the presence of coronary artery disease, the severity of this disease and any potential stenosis cannot be assessed on this non-gated CT examination. Assessment for potential risk factor modification, dietary therapy or pharmacologic therapy may be warranted, if clinically indicated. 3. Mild diffuse bronchial wall thickening with mild centrilobular and paraseptal emphysema; imaging findings suggestive of underlying COPD. 4. There are calcifications of the aortic valve. Echocardiographic correlation for evaluation of potential valvular dysfunction may be warranted if clinically indicated. - Aortic Atherosclerosis (ICD10-I70.0) and Emphysema (ICD10-J43.9).  MRI T-spine 01/24/23: IMPRESSION: 1. Mild thoracic spine spondylosis as described above. 2. No acute osseous injury of the thoracic spine.  MRI C-spine 01/24/23: IMPRESSION: 1. At C5-6 there is a broad-based disc osteophyte complex with a small central disc protrusion. Mild bilateral foraminal stenosis. 2. No acute osseous injury of the cervical spine.   MRI L-spine 08/25/22: IMPRESSION: 1. Mild lumbar spondylosis without spinal canal stenosis. 2. Central disc protrusion at L5-S1 displaces the traversing left S1 nerve root in the subarticular zone.    EKG: 02/21/23: NSR   CV: Aortoiliac Bypass Graft [place] Evaluation with ABIs 12/28/22: Summary:  - Patent aorta-iliac bypass graft with no visualized stenosis. The right  stent appears patent.  - Right: Resting right ankle-brachial index is within normal range. The  right toe-brachial index is abnormal.  - Left: Resting left ankle-brachial index is within normal range. The left  toe-brachial index is abnormal.    US Carotid 10/26/21: Summary:  - Right Carotid: Velocities in the right ICA are consistent with a 1-39% stenosis.  - Left Carotid: Velocities in the left ICA are consistent with a 1-39%  stenosis.  - Vertebrals: Bilateral vertebral arteries demonstrate antegrade flow.   - Subclavians: Normal flow hemodynamics were seen in bilateral subclavian arteries.      Echo 12/28/20: IMPRESSIONS   1. Left ventricular ejection fraction, by estimation, is 70 to 75%. The  left ventricle has hyperdynamic function. The left ventricle has no  regional wall motion abnormalities. Left ventricular diastolic parameters  are indeterminate.   2. Right ventricular systolic function is normal. The right ventricular  size is normal. There is normal pulmonary artery systolic pressure.   3. The mitral valve is normal in structure. No evidence of mitral valve  regurgitation. No evidence of mitral stenosis.   4. The aortic valve is calcified. Aortic valve regurgitation is not  visualized. Mild aortic valve stenosis.  AV VTI:            0.541 m  AV Peak Grad:      20.6 mmHg  AV Mean Grad:      11.8 mmHg  - Comparison echo : LVEF 70-75%, mild basal septal hypertrophy, no RWMA, grade I DD, mild-moderate AS, AV mean gradient 14.7 mmHg. AV peak gradient 29.3 mmHg. AVA by VTI 1.29 cm     CT Coronary 04/17/19 (ordered by CT surgeon Evelene Croon, MD prior to innominate artery bypass): IMPRESSION: 1. Coronary calcium score of 140. This was 60 percentile for age and sex matched control. 2. Normal coronary origin with left dominance. 3. CAD-RADS 2. Mild non-obstructive CAD (25-49%) in the  proximal LAD. Consider preventive therapy and risk factor modification. The LCX had only luminal irregularities. RCA is a small non-dominant artery that has mild calcified plaque with stenosis 0-25%.    Past Medical History:  Diagnosis Date   Aortic stenosis    mild-moderate AS 04/22/19 echo   Aortoiliac occlusive disease (HCC) 12/03/2016   Arthritis    Diabetes mellitus without complication (HCC)    pt states she has been told she is no longer diabetic and is not taking meds   Diverticulitis    Gout    Heart murmur    Hyperlipidemia    Hypertension    Osteoporosis    Sleep apnea    Tobacco  use disorder 07/04/2012    Past Surgical History:  Procedure Laterality Date   ABDOMINAL AORTOGRAM W/LOWER EXTREMITY N/A 12/03/2016   Procedure: Abdominal Aortogram w/Lower Extremity;  Surgeon: Chuck Hint, MD;  Location: [facility] INVASIVE CV LAB;  Service: Cardiovascular;  Laterality: N/A;   AORTA -INNOMIATE BYPASS N/A 05/18/2019   Procedure: AORTA -INNOMIATE BYPASS Using Hemashield Gold Graft Size 8mm;  Surgeon: Alleen Borne, MD;  Location: [facility];  Service: Open Heart Surgery;  Laterality: N/A;   aorto- fem bypass Bilateral    aortobiiliac bypass in 2000   DILATATION & CURETTAGE/HYSTEROSCOPY WITH MYOSURE N/A 12/27/2015   Procedure: DILATATION & CURETTAGE/HYSTEROSCOPY;  Surgeon: Patton Salles, MD;  Location: [facility] ORS;  Service: Gynecology;  Laterality: N/A;   DILATION AND CURETTAGE OF UTERUS  11/2015   ELBOW SURGERY Left ~2007   tendon repair by Dr. Ranell Patrick   EYE SURGERY Bilateral 2008   lasik   JOINT REPLACEMENT     lower aortic bypass  2000   per patient   PERIPHERAL VASCULAR INTERVENTION  12/03/2016   Stenting of anastomotic stenosis of the aortoiliac and right common iliac artery with 7 x 39 mm VBX covered stent and postdilated with 9 mm balloon.   Right innonimate bypass  05/18/2019   Aorto innominate bypass surgery with median sternotomy   STERNOTOMY  05/18/2019   Right aorto-innonimate bypass surgery 05/18/19 Dr. Laneta Simmers   TOTAL HIP ARTHROPLASTY Right 07/27/2014   Procedure: RIGHT TOTAL HIP ARTHROPLASTY ANTERIOR APPROACH;  Surgeon: Durene Romans, MD;  Location: [facility] ORS;  Service: Orthopedics;  Laterality: Right;   TOTAL KNEE ARTHROPLASTY Right 05/28/2022   Procedure: RIGHT TOTAL KNEE ARTHROPLASTY;  Surgeon: Tarry Kos, MD;  Location: [facility];  Service: Orthopedics;  Laterality: Right;    MEDICATIONS:  acetaminophen (TYLENOL) 500 MG tablet   allopurinol (ZYLOPRIM) 100 MG tablet   atenolol (TENORMIN) 50 MG tablet   atorvastatin (LIPITOR) 80 MG tablet    buPROPion (WELLBUTRIN XL) 150 MG 24 hr tablet   CALCIUM 600 1500 (600 Ca) MG TABS tablet   Cholecalciferol (VITAMIN D3) 250 MCG (10000 UT) capsule   clobetasol (TEMOVATE) 0.05 % GEL   docusate sodium (COLACE) 100 MG capsule   doxycycline (VIBRAMYCIN) 100 MG capsule   ezetimibe (ZETIA) 10 MG tablet   losartan (COZAAR) 50 MG tablet   magnesium oxide (MAG-OX) 400 (240 Mg) MG tablet   OZEMPIC, 0.25 OR 0.5 MG/DOSE, 2 MG/3ML SOPN   pregabalin (LYRICA) 75 MG capsule   aspirin EC 81 MG tablet   cefadroxil (DURICEF) 500 MG capsule   docusate sodium (COLACE) 100 MG capsule   HYDROcodone-acetaminophen (NORCO) 7.5-325 MG tablet   Lancets MISC   metFORMIN (GLUCOPHAGE) 500 MG tablet   methocarbamol (ROBAXIN-750) 750 MG tablet   olmesartan-hydrochlorothiazide (BENICAR HCT)  40-25 MG tablet   ondansetron (ZOFRAN) 4 MG tablet   oxyCODONE-acetaminophen (PERCOCET) 5-325 MG tablet   No current facility-administered medications for this encounter.   By medication list, she is not currently on Duricef, Colace, Norco, metformin, Benicar HCT. Doxycycline is for post-op.   Shonna Chock, PA-C Surgical Short Stay/Anesthesiology [facility] Phone [phone] [facility] Phone [phone] 02/25/2023 3:42 PM

## 2023-03-04 ENCOUNTER — Observation Stay (HOSPITAL_COMMUNITY)
Admission: RE | Admit: 2023-03-04 | Discharge: 2023-03-05 | Disposition: A | Discharge status: Home / Self Care | Payer: Medicare Other | Source: Ambulatory Visit | Attending: Orthopaedic Surgery | Admitting: Orthopaedic Surgery

## 2023-03-04 ENCOUNTER — Encounter (HOSPITAL_COMMUNITY)
Admission: RE | Disposition: A | Discharge status: Home / Self Care | Payer: Self-pay | Source: Ambulatory Visit | Attending: Orthopaedic Surgery

## 2023-03-04 ENCOUNTER — Other Ambulatory Visit: Payer: Self-pay

## 2023-03-04 ENCOUNTER — Encounter (HOSPITAL_COMMUNITY): Payer: Self-pay | Admitting: Orthopaedic Surgery

## 2023-03-04 ENCOUNTER — Ambulatory Visit (HOSPITAL_COMMUNITY): Payer: Self-pay | Admitting: Anesthesiology

## 2023-03-04 ENCOUNTER — Observation Stay (HOSPITAL_COMMUNITY): Payer: Medicare Other

## 2023-03-04 ENCOUNTER — Ambulatory Visit (HOSPITAL_COMMUNITY): Payer: Medicare Other | Admitting: Physician Assistant

## 2023-03-04 DIAGNOSIS — E119 Type 2 diabetes mellitus without complications: Secondary | ICD-10-CM | POA: Insufficient documentation

## 2023-03-04 DIAGNOSIS — Z79899 Other long term (current) drug therapy: Secondary | ICD-10-CM | POA: Insufficient documentation

## 2023-03-04 DIAGNOSIS — Z7982 Long term (current) use of aspirin: Secondary | ICD-10-CM | POA: Diagnosis not present

## 2023-03-04 DIAGNOSIS — M1712 Unilateral primary osteoarthritis, left knee: Secondary | ICD-10-CM

## 2023-03-04 DIAGNOSIS — I2510 Atherosclerotic heart disease of native coronary artery without angina pectoris: Secondary | ICD-10-CM

## 2023-03-04 DIAGNOSIS — Z87891 Personal history of nicotine dependence: Secondary | ICD-10-CM | POA: Insufficient documentation

## 2023-03-04 DIAGNOSIS — Z96641 Presence of right artificial hip joint: Secondary | ICD-10-CM | POA: Insufficient documentation

## 2023-03-04 DIAGNOSIS — I10 Essential (primary) hypertension: Secondary | ICD-10-CM | POA: Insufficient documentation

## 2023-03-04 DIAGNOSIS — Z96652 Presence of left artificial knee joint: Secondary | ICD-10-CM

## 2023-03-04 DIAGNOSIS — Z96651 Presence of right artificial knee joint: Secondary | ICD-10-CM | POA: Insufficient documentation

## 2023-03-04 HISTORY — PX: TOTAL KNEE ARTHROPLASTY: SHX125

## 2023-03-04 LAB — GLUCOSE, CAPILLARY
Glucose-Capillary: 71 mg/dL (ref 70–99)
Glucose-Capillary: 83 mg/dL (ref 70–99)
Glucose-Capillary: 89 mg/dL (ref 70–99)

## 2023-03-04 SURGERY — ARTHROPLASTY, KNEE, TOTAL
Anesthesia: Monitor Anesthesia Care | Site: Knee | Laterality: Left

## 2023-03-04 MED ORDER — SODIUM CHLORIDE 0.9 % IR SOLN
Status: DC | PRN
  Administered 2023-03-04: 1000 mL

## 2023-03-04 MED ORDER — LOSARTAN POTASSIUM 50 MG PO TABS
50.0000 mg | ORAL_TABLET | Freq: Every day | ORAL | Status: DC
  Administered 2023-03-04 – 2023-03-05 (×2): 50 mg via ORAL
  Filled 2023-03-04 (×2): qty 1

## 2023-03-04 MED ORDER — BUPIVACAINE-MELOXICAM ER 400-12 MG/14ML IJ SOLN
INTRAMUSCULAR | Status: DC | PRN
  Administered 2023-03-04: 400 mg

## 2023-03-04 MED ORDER — CEFAZOLIN SODIUM-DEXTROSE 2-4 GM/100ML-% IV SOLN
2.0000 g | Freq: Four times a day (QID) | INTRAVENOUS | Status: AC
  Administered 2023-03-04 (×2): 2 g via INTRAVENOUS
  Filled 2023-03-04 (×2): qty 100

## 2023-03-04 MED ORDER — METHOCARBAMOL 500 MG PO TABS
500.0000 mg | ORAL_TABLET | Freq: Four times a day (QID) | ORAL | Status: DC | PRN
  Administered 2023-03-04: 500 mg via ORAL
  Filled 2023-03-04: qty 1

## 2023-03-04 MED ORDER — MENTHOL 3 MG MT LOZG: 1.0000 | LOZENGE | OROMUCOSAL | Status: DC | PRN

## 2023-03-04 MED ORDER — CEFAZOLIN SODIUM-DEXTROSE 2-4 GM/100ML-% IV SOLN
2.0000 g | INTRAVENOUS | Status: AC
  Administered 2023-03-04: 2 g via INTRAVENOUS
  Filled 2023-03-04: qty 100

## 2023-03-04 MED ORDER — HYDROMORPHONE HCL 1 MG/ML IJ SOLN
0.5000 mg | INTRAMUSCULAR | Status: DC | PRN
  Administered 2023-03-04: 1 mg via INTRAVENOUS
  Filled 2023-03-04: qty 1

## 2023-03-04 MED ORDER — LACTATED RINGERS IV SOLN: INTRAVENOUS | Status: DC

## 2023-03-04 MED ORDER — ORAL CARE MOUTH RINSE: 15.0000 mL | Freq: Once | OROMUCOSAL | Status: AC

## 2023-03-04 MED ORDER — ROPIVACAINE HCL 5 MG/ML IJ SOLN
INTRAMUSCULAR | Status: DC | PRN
  Administered 2023-03-04: 20 mL via PERINEURAL

## 2023-03-04 MED ORDER — BUPIVACAINE-MELOXICAM ER 400-12 MG/14ML IJ SOLN
INTRAMUSCULAR | Status: AC
  Filled 2023-03-04: qty 1

## 2023-03-04 MED ORDER — VANCOMYCIN HCL 1000 MG IV SOLR
INTRAVENOUS | Status: DC | PRN
  Administered 2023-03-04: 1000 mg

## 2023-03-04 MED ORDER — BUPROPION HCL ER (XL) 150 MG PO TB24
150.0000 mg | ORAL_TABLET | Freq: Every morning | ORAL | Status: DC
  Administered 2023-03-05: 150 mg via ORAL
  Filled 2023-03-04: qty 1

## 2023-03-04 MED ORDER — OXYCODONE HCL 5 MG PO TABS: 5.0000 mg | ORAL_TABLET | ORAL | Status: DC | PRN

## 2023-03-04 MED ORDER — METOCLOPRAMIDE HCL 5 MG/ML IJ SOLN
5.0000 mg | Freq: Three times a day (TID) | INTRAMUSCULAR | Status: DC | PRN
  Administered 2023-03-04: 10 mg via INTRAVENOUS
  Filled 2023-03-04: qty 2

## 2023-03-04 MED ORDER — TRANEXAMIC ACID 1000 MG/10ML IV SOLN
INTRAVENOUS | Status: DC | PRN
  Administered 2023-03-04: 2000 mg via TOPICAL

## 2023-03-04 MED ORDER — ONDANSETRON HCL 4 MG/2ML IJ SOLN: 4.0000 mg | Freq: Four times a day (QID) | INTRAMUSCULAR | Status: DC | PRN

## 2023-03-04 MED ORDER — METOCLOPRAMIDE HCL 5 MG PO TABS: 5.0000 mg | ORAL_TABLET | Freq: Three times a day (TID) | ORAL | Status: DC | PRN

## 2023-03-04 MED ORDER — OXYCODONE HCL 5 MG PO TABS: 10.0000 mg | ORAL_TABLET | ORAL | Status: DC | PRN

## 2023-03-04 MED ORDER — PRONTOSAN WOUND IRRIGATION OPTIME
TOPICAL | Status: DC | PRN
  Administered 2023-03-04: 1 via TOPICAL

## 2023-03-04 MED ORDER — TRANEXAMIC ACID-NACL 1000-0.7 MG/100ML-% IV SOLN
1000.0000 mg | Freq: Once | INTRAVENOUS | Status: AC
  Administered 2023-03-04: 1000 mg via INTRAVENOUS
  Filled 2023-03-04: qty 100

## 2023-03-04 MED ORDER — METHOCARBAMOL 1000 MG/10ML IJ SOLN: 500.0000 mg | Freq: Four times a day (QID) | INTRAMUSCULAR | Status: DC | PRN

## 2023-03-04 MED ORDER — MIDAZOLAM HCL 2 MG/2ML IJ SOLN
INTRAMUSCULAR | Status: AC
  Administered 2023-03-04: 2 mg via INTRAVENOUS
  Filled 2023-03-04: qty 2

## 2023-03-04 MED ORDER — PREGABALIN 75 MG PO CAPS
75.0000 mg | ORAL_CAPSULE | Freq: Two times a day (BID) | ORAL | Status: DC
  Administered 2023-03-04 – 2023-03-05 (×2): 75 mg via ORAL
  Filled 2023-03-04 (×2): qty 1

## 2023-03-04 MED ORDER — FERROUS SULFATE 325 (65 FE) MG PO TABS
325.0000 mg | ORAL_TABLET | Freq: Three times a day (TID) | ORAL | Status: DC
  Administered 2023-03-04 – 2023-03-05 (×3): 325 mg via ORAL
  Filled 2023-03-04 (×3): qty 1

## 2023-03-04 MED ORDER — VANCOMYCIN HCL 1000 MG IV SOLR
INTRAVENOUS | Status: AC
  Filled 2023-03-04: qty 20

## 2023-03-04 MED ORDER — CHLORHEXIDINE GLUCONATE 0.12 % MT SOLN
15.0000 mL | Freq: Once | OROMUCOSAL | Status: AC
  Administered 2023-03-04: 15 mL via OROMUCOSAL
  Filled 2023-03-04: qty 15

## 2023-03-04 MED ORDER — 0.9 % SODIUM CHLORIDE (POUR BTL) OPTIME
TOPICAL | Status: DC | PRN
  Administered 2023-03-04: 1000 mL

## 2023-03-04 MED ORDER — MIDAZOLAM HCL 2 MG/2ML IJ SOLN: 2.0000 mg | Freq: Once | INTRAMUSCULAR | Status: AC

## 2023-03-04 MED ORDER — FENTANYL CITRATE (PF) 100 MCG/2ML IJ SOLN: 50.0000 ug | Freq: Once | INTRAMUSCULAR | Status: AC

## 2023-03-04 MED ORDER — POVIDONE-IODINE 10 % EX SWAB
2.0000 | Freq: Once | CUTANEOUS | Status: AC
  Administered 2023-03-04: 2 via TOPICAL

## 2023-03-04 MED ORDER — OXYCODONE HCL ER 10 MG PO T12A
10.0000 mg | EXTENDED_RELEASE_TABLET | Freq: Two times a day (BID) | ORAL | Status: DC
  Administered 2023-03-04 – 2023-03-05 (×3): 10 mg via ORAL
  Filled 2023-03-04 (×4): qty 1

## 2023-03-04 MED ORDER — DEXAMETHASONE SODIUM PHOSPHATE 10 MG/ML IJ SOLN
INTRAMUSCULAR | Status: DC | PRN
  Administered 2023-03-04: 10 mg

## 2023-03-04 MED ORDER — ACETAMINOPHEN 500 MG PO TABS
1000.0000 mg | ORAL_TABLET | Freq: Four times a day (QID) | ORAL | Status: AC
  Administered 2023-03-04 – 2023-03-05 (×4): 1000 mg via ORAL
  Filled 2023-03-04 (×4): qty 2

## 2023-03-04 MED ORDER — PHENOL 1.4 % MT LIQD: 1.0000 | OROMUCOSAL | Status: DC | PRN

## 2023-03-04 MED ORDER — TRANEXAMIC ACID-NACL 1000-0.7 MG/100ML-% IV SOLN
1000.0000 mg | INTRAVENOUS | Status: AC
  Administered 2023-03-04: 1000 mg via INTRAVENOUS
  Filled 2023-03-04: qty 100

## 2023-03-04 MED ORDER — ASPIRIN 81 MG PO CHEW
81.0000 mg | CHEWABLE_TABLET | Freq: Two times a day (BID) | ORAL | Status: DC
  Administered 2023-03-04 – 2023-03-05 (×2): 81 mg via ORAL
  Filled 2023-03-04 (×2): qty 1

## 2023-03-04 MED ORDER — ACETAMINOPHEN 325 MG PO TABS: 325.0000 mg | ORAL_TABLET | Freq: Four times a day (QID) | ORAL | Status: DC | PRN

## 2023-03-04 MED ORDER — DOCUSATE SODIUM 100 MG PO CAPS
100.0000 mg | ORAL_CAPSULE | Freq: Two times a day (BID) | ORAL | Status: DC
  Administered 2023-03-04: 100 mg via ORAL
  Filled 2023-03-04 (×2): qty 1

## 2023-03-04 MED ORDER — DEXAMETHASONE SODIUM PHOSPHATE 10 MG/ML IJ SOLN
10.0000 mg | Freq: Once | INTRAMUSCULAR | Status: AC
  Administered 2023-03-05: 10 mg via INTRAVENOUS
  Filled 2023-03-04: qty 1

## 2023-03-04 MED ORDER — ONDANSETRON HCL 4 MG/2ML IJ SOLN
INTRAMUSCULAR | Status: DC | PRN
  Administered 2023-03-04: 4 mg via INTRAVENOUS

## 2023-03-04 MED ORDER — KETOROLAC TROMETHAMINE 15 MG/ML IJ SOLN
7.5000 mg | Freq: Four times a day (QID) | INTRAMUSCULAR | Status: AC
  Administered 2023-03-04 – 2023-03-05 (×4): 7.5 mg via INTRAVENOUS
  Filled 2023-03-04 (×4): qty 1

## 2023-03-04 MED ORDER — TRANEXAMIC ACID 1000 MG/10ML IV SOLN
2000.0000 mg | INTRAVENOUS | Status: DC
  Filled 2023-03-04: qty 20

## 2023-03-04 MED ORDER — PROPOFOL 500 MG/50ML IV EMUL
INTRAVENOUS | Status: DC | PRN
  Administered 2023-03-04: 25 mg via INTRAVENOUS
  Administered 2023-03-04: 100 ug/kg/min via INTRAVENOUS
  Administered 2023-03-04: 20 mg via INTRAVENOUS

## 2023-03-04 MED ORDER — ONDANSETRON HCL 4 MG PO TABS
4.0000 mg | ORAL_TABLET | Freq: Four times a day (QID) | ORAL | Status: DC | PRN
  Administered 2023-03-04: 4 mg via ORAL
  Filled 2023-03-04: qty 1

## 2023-03-04 MED ORDER — FENTANYL CITRATE (PF) 100 MCG/2ML IJ SOLN
INTRAMUSCULAR | Status: AC
  Administered 2023-03-04: 50 ug via INTRAVENOUS
  Filled 2023-03-04: qty 2

## 2023-03-04 SURGICAL SUPPLY — 85 items
ADH SKN CLS APL DERMABOND .7 (GAUZE/BANDAGES/DRESSINGS) ×1
ALCOHOL 70% 16 OZ (MISCELLANEOUS) ×2 IMPLANT
BAG COUNTER SPONGE SURGICOUNT (BAG) IMPLANT
BAG DECANTER FOR FLEXI CONT (MISCELLANEOUS) ×2 IMPLANT
BAG SPNG CNTER NS LX DISP (BAG)
BANDAGE ESMARK 6X9 LF (GAUZE/BANDAGES/DRESSINGS) IMPLANT
BLADE SAG 18X100X1.27 (BLADE) ×2 IMPLANT
BLADE SAW SAG 90X13X1.27 (BLADE) IMPLANT
BLADE SAW SGTL 73X25 THK (BLADE) ×2 IMPLANT
BNDG CMPR 9X6 STRL LF SNTH (GAUZE/BANDAGES/DRESSINGS)
BNDG ESMARK 6X9 LF (GAUZE/BANDAGES/DRESSINGS)
BOWL SMART MIX CTS (DISPOSABLE) ×2 IMPLANT
BSPLAT TIB 5D C CMNT STM LT (Knees) ×1 IMPLANT
CEMENT BONE REFOBACIN R1X40 US (Cement) IMPLANT
CLSR STERI-STRIP ANTIMIC 1/2X4 (GAUZE/BANDAGES/DRESSINGS) ×4 IMPLANT
COMP FEM CMT PERS SZ4 LT (Joint) ×1 IMPLANT
COMPONENT FEM CMT PERS SZ4 LT (Joint) IMPLANT
COOLER ICEMAN CLASSIC (MISCELLANEOUS) ×2 IMPLANT
COVER SURGICAL LIGHT HANDLE (MISCELLANEOUS) ×2 IMPLANT
CUFF TOURN SGL QUICK 34 (TOURNIQUET CUFF) ×1
CUFF TOURN SGL QUICK 42 (TOURNIQUET CUFF) IMPLANT
CUFF TRNQT CYL 34X4.125X (TOURNIQUET CUFF) ×2 IMPLANT
DERMABOND ADVANCED .7 DNX12 (GAUZE/BANDAGES/DRESSINGS) ×2 IMPLANT
DRAPE EXTREMITY T 121X128X90 (DISPOSABLE) ×2 IMPLANT
DRAPE HALF SHEET 40X57 (DRAPES) ×2 IMPLANT
DRAPE INCISE IOBAN 66X45 STRL (DRAPES) ×2 IMPLANT
DRAPE POUCH INSTRU U-SHP 10X18 (DRAPES) ×2 IMPLANT
DRAPE SURG ORHT 6 SPLT 77X108 (DRAPES) IMPLANT
DRAPE U-SHAPE 47X51 STRL (DRAPES) ×4 IMPLANT
DRSG AQUACEL AG ADV 3.5X10 (GAUZE/BANDAGES/DRESSINGS) ×2 IMPLANT
DURAPREP 26ML APPLICATOR (WOUND CARE) ×6 IMPLANT
ELECT CAUTERY BLADE 6.4 (BLADE) ×2 IMPLANT
ELECT PENCIL ROCKER SW 15FT (MISCELLANEOUS) ×2 IMPLANT
ELECT REM PT RETURN 9FT ADLT (ELECTROSURGICAL) ×1
ELECTRODE REM PT RTRN 9FT ADLT (ELECTROSURGICAL) ×2 IMPLANT
GLOVE BIOGEL PI IND STRL 7.0 (GLOVE) ×4 IMPLANT
GLOVE BIOGEL PI IND STRL 7.5 (GLOVE) ×10 IMPLANT
GLOVE ECLIPSE 7.0 STRL STRAW (GLOVE) ×6 IMPLANT
GLOVE INDICATOR 7.0 STRL GRN (GLOVE) ×2 IMPLANT
GLOVE INDICATOR 7.5 STRL GRN (GLOVE) ×2 IMPLANT
GLOVE SURG SYN 7.5 E (GLOVE) ×2 IMPLANT
GLOVE SURG SYN 7.5 PF PI (GLOVE) ×4 IMPLANT
GLOVE SURG UNDER LTX SZ7.5 (GLOVE) ×4 IMPLANT
GLOVE SURG UNDER POLY LF SZ7 (GLOVE) ×4 IMPLANT
GOWN STRL REUS W/ TWL LRG LVL3 (GOWN DISPOSABLE) ×2 IMPLANT
GOWN STRL REUS W/TWL LRG LVL3 (GOWN DISPOSABLE) ×1
GOWN STRL SURGICAL XL XLNG (GOWN DISPOSABLE) ×2 IMPLANT
GOWN TOGA ZIPPER T7+ PEEL AWAY (MISCELLANEOUS) ×4 IMPLANT
HANDPIECE INTERPULSE COAX TIP (DISPOSABLE) ×1
HOOD PEEL AWAY T7 (MISCELLANEOUS) ×2 IMPLANT
INSERT ASF PERS 11 CD/4-5 LT (Insert) IMPLANT
KIT BASIN OR (CUSTOM PROCEDURE TRAY) ×2 IMPLANT
KIT TURNOVER KIT B (KITS) ×2 IMPLANT
MANIFOLD NEPTUNE II (INSTRUMENTS) ×2 IMPLANT
MARKER SKIN DUAL TIP RULER LAB (MISCELLANEOUS) ×4 IMPLANT
NDL SPNL 18GX3.5 QUINCKE PK (NEEDLE) ×2 IMPLANT
NEEDLE SPNL 18GX3.5 QUINCKE PK (NEEDLE) ×1 IMPLANT
NS IRRIG 1000ML POUR BTL (IV SOLUTION) ×2 IMPLANT
PACK TOTAL JOINT (CUSTOM PROCEDURE TRAY) ×2 IMPLANT
PAD ARMBOARD 7.5X6 YLW CONV (MISCELLANEOUS) ×4 IMPLANT
PAD COLD SHLDR WRAP-ON (PAD) ×2 IMPLANT
PIN DRILL HDLS TROCAR 75 4PK (PIN) IMPLANT
SCREW FEMALE HEX FIX 25X2.5 (ORTHOPEDIC DISPOSABLE SUPPLIES) IMPLANT
SET HNDPC FAN SPRY TIP SCT (DISPOSABLE) ×2 IMPLANT
SOLUTION PRONTOSAN WOUND 350ML (IRRIGATION / IRRIGATOR) ×2 IMPLANT
STAPLER VISISTAT 35W (STAPLE) IMPLANT
STEM POLY PAT PLY 29M KNEE (Knees) IMPLANT
STEM TIB ST PERS 14+30 (Stem) IMPLANT
STEM TIBIA 5 DEG SZ C L KNEE (Knees) IMPLANT
SUCTION TUBE FRAZIER 10FR DISP (SUCTIONS) ×2 IMPLANT
SUT ETHILON 2 0 FS 18 (SUTURE) IMPLANT
SUT MNCRL AB 3-0 PS2 27 (SUTURE) IMPLANT
SUT VIC AB 0 CT1 27 (SUTURE) ×2
SUT VIC AB 0 CT1 27XBRD ANBCTR (SUTURE) ×4 IMPLANT
SUT VIC AB 1 CTX 27 (SUTURE) ×6 IMPLANT
SUT VIC AB 2-0 CT1 27 (SUTURE) ×4
SUT VIC AB 2-0 CT1 TAPERPNT 27 (SUTURE) ×8 IMPLANT
SYR 50ML LL SCALE MARK (SYRINGE) ×4 IMPLANT
TIBIA STEM 5 DEG SZ C L KNEE (Knees) ×1 IMPLANT
TOWEL GREEN STERILE (TOWEL DISPOSABLE) ×2 IMPLANT
TOWEL GREEN STERILE FF (TOWEL DISPOSABLE) ×2 IMPLANT
TRAY CATH INTERMITTENT SS 16FR (CATHETERS) IMPLANT
TUBE SUCT ARGYLE STRL (TUBING) ×2 IMPLANT
UNDERPAD 30X36 HEAVY ABSORB (UNDERPADS AND DIAPERS) ×2 IMPLANT
YANKAUER SUCT BULB TIP NO VENT (SUCTIONS) ×4 IMPLANT

## 2023-03-04 NOTE — Plan of Care (Signed)
 [facility] Tonsillectomy/Adenoidectomy, Postoperative PEDS care plan entered in error.

## 2023-03-04 NOTE — Transfer of Care (Signed)
Immediate Anesthesia Transfer of Care Note  Patient: Britanni C Ninh  Procedure(s) Performed: TOTAL KNEE ARTHROPLASTY (Left: Knee)  Patient Location: PACU  Anesthesia Type:MAC, Regional, and Spinal  Level of Consciousness: drowsy and patient cooperative  Airway & Oxygen Therapy: Patient Spontanous Breathing  Post-op Assessment: Report given to RN and Post -op Vital signs reviewed and stable  Post vital signs: Reviewed and stable  Last Vitals:  Vitals Value Taken Time  BP 101/41 03/04/23 1153  Temp    Pulse 69 03/04/23 1154  Resp 13 03/04/23 1154  SpO2 95 % 03/04/23 1154  Vitals shown include unfiled device data.  Last Pain:  Vitals:   03/04/23 0758  PainSc: 0-No pain         Complications: No notable events documented.

## 2023-03-04 NOTE — Discharge Instructions (Signed)

## 2023-03-04 NOTE — Anesthesia Postprocedure Evaluation (Signed)
Anesthesia Post Note  Patient: Diane Giles  Procedure(s) Performed: TOTAL KNEE ARTHROPLASTY (Left: Knee)     Patient location during evaluation: PACU Anesthesia Type: Regional, Spinal and MAC Level of consciousness: oriented and awake and alert Pain management: pain level controlled Vital Signs Assessment: post-procedure vital signs reviewed and stable Respiratory status: spontaneous breathing, respiratory function stable and patient connected to nasal cannula oxygen Cardiovascular status: blood pressure returned to baseline and stable Postop Assessment: no headache, no backache and no apparent nausea or vomiting Anesthetic complications: no   No notable events documented.  Last Vitals:  Vitals:   03/04/23 1156 03/04/23 1200  BP: (!) 101/41 (!) 96/53  Pulse: 66 63  Resp: 14 12  Temp: 36.4 C   SpO2: 95% 95%    Last Pain:  Vitals:   03/04/23 1200  PainSc: 0-No pain    LLE Motor Response: Purposeful movement (03/04/23 1200) LLE Sensation: Decreased (03/04/23 1200) RLE Motor Response: Purposeful movement (03/04/23 1200) RLE Sensation: Decreased (03/04/23 1200) L Sensory Level: S1-Sole of foot, small toes (03/04/23 1200) R Sensory Level: S1-Sole of foot, small toes (03/04/23 1200)  Matilde Pottenger

## 2023-03-04 NOTE — Anesthesia Procedure Notes (Signed)
Anesthesia Regional Block: Adductor canal block   Pre-Anesthetic Checklist: , timeout performed,  Correct Patient, Correct Site, Correct Laterality,  Correct Procedure, Correct Position, site marked,  Risks and benefits discussed,  Surgical consent,  Pre-op evaluation,  At surgeon's request and post-op pain management  Laterality: Left  Prep: chloraprep       Needles:  Injection technique: Single-shot  Needle Type: Echogenic Stimulator Needle     Needle Length: 5cm  Needle Gauge: 22     Additional Needles:   Procedures:, nerve stimulator,,, ultrasound used (permanent image in chart),,    Narrative:  Start time: 03/04/2023 9:20 AM End time: 03/04/2023 9:24 AM Injection made incrementally with aspirations every 5 mL.  Performed by: Personally  Anesthesiologist: Bethena Midget, MD  Additional Notes: Functioning IV was confirmed and monitors were applied.  A 50mm 22ga Arrow echogenic stimulator needle was used. Sterile prep and drape,hand hygiene and sterile gloves were used. Ultrasound guidance: relevant anatomy identified, needle position confirmed, local anesthetic spread visualized around nerve(s)., vascular puncture avoided.  Image printed for medical record. Negative aspiration and negative test dose prior to incremental administration of local anesthetic. The patient tolerated the procedure well.

## 2023-03-04 NOTE — Plan of Care (Signed)
  Problem: Education: Goal: Knowledge of General Education information will improve Description Including pain rating scale, medication(s)/side effects and non-pharmacologic comfort measures Outcome: Progressing   

## 2023-03-04 NOTE — Op Note (Signed)
Total Knee Arthroplasty Procedure Note  Preoperative diagnosis: Left knee osteoarthritis  Postoperative diagnosis:same  Operative findings: Complete loss joint space from lateral and patellofemoral compartments Flexion contracture Valgus deformity  Operative procedure: Left total knee arthroplasty. CPT [phone]  Surgeon: N. Glee Arvin, MD  Assist: Oneal Grout, PA-C; necessary for the timely completion of procedure and due to complexity of procedure.  Anesthesia: Spinal, regional, local  Tourniquet time: see anesthesia record  Implants used: Zimmer persona Femur: CR 4 Tibia: C with 30 mm cemented stem Patella: 29 mm Polyethylene: 11 mm medial congruent  Indication: Diane Giles is a 67 y.o. year old female with a history of knee pain. Having failed conservative management, the patient elected to proceed with a total knee arthroplasty.  We have reviewed the risk and benefits of the surgery and they elected to proceed after voicing understanding.  Procedure:  After informed consent was obtained and understanding of the risk were voiced including but not limited to bleeding, infection, damage to surrounding structures including nerves and vessels, blood clots, leg length inequality and the failure to achieve desired results, the operative extremity was marked with verbal confirmation of the patient in the holding area.   The patient was then brought to the operating room and transported to the operating room table in the supine position.  A tourniquet was applied to the operative extremity around the upper thigh. The operative limb was then prepped and draped in the usual sterile fashion and preoperative antibiotics were administered.  A time out was performed prior to the start of surgery confirming the correct extremity, preoperative antibiotic administration, as well as team members, implants and instruments available for the case. Correct surgical site was also  confirmed with preoperative radiographs. The limb was then elevated for exsanguination and the tourniquet was inflated. A midline incision was made and a standard medial parapatellar approach was performed.  The infrapatellar fat pad was removed.  Suprapatellar synovium was removed to reveal the anterior distal femoral cortex.  A medial peel was performed to release the capsule and the deep MCL off of the medial tibial plateau back to the semimembranosus.  The patella was then everted which showed complete loss of articular cartilage and was prepared and sized to a 29 mm.  A cover was placed on the patella for protection from retractors.  The knee was then brought into full flexion and we then turned our attention to the femur.  The ACL was sacrificed.  Start site was drilled in the femur and the intramedullary distal femoral cutting guide was placed, set at 5 degrees valgus, taking 12 mm of distal resection for the flexion contracture. The distal cut was made. Osteophytes were then removed.  Next, the proximal tibial cutting guide was placed with appropriate slope, varus/valgus alignment and depth of resection.  The drop rod was attached to confirm that it was aimed at the second metatarsal.  The proximal tibial cut was made taking 10 mm off the high side. Gap blocks were then used to assess the extension gap and alignment, and appropriate soft tissue releases were performed.  The lateral capsule, LCL were pie crusted and the lateral retinaculum was released. Attention was turned back to the femur, which was sized using the sizing guide to a size 4. Appropriate rotation of the femoral component was determined using epicondylar axis, Whiteside's line, and assessing the flexion gap under ligament tension. The appropriate size 4-in-1 cutting block was placed and checked with an angel  wing and cuts were made. Posterior femoral osteophytes and uncapped bone were then removed with the curved osteotome.  The menisci  were removed.  Trial components were placed, and stability was checked in full extension, mid-flexion, and deep flexion.  PCL was sacrificed because the flexion space was a bit tight. Proper tibial rotation was determined and marked.  The patella tracked well after a lateral release.  The femoral lugs were then drilled. Trial components were then removed and tibial preparation performed.  The trial tibia was pointed to the medial third of the tibial tubercle.  The tibia was sized for a size C component.   The bony surfaces were irrigated with a pulse lavage and then dried. Bone cement was vacuum mixed on the back table, and the final components sized above were cemented into place.  Antibiotic irrigation was placed in the knee joint and soft tissues while the cement cured.  After cement had finished curing, excess cement was removed. The stability of the construct was re-evaluated throughout a range of motion and found to be acceptable. The trial liner was removed, the knee was copiously irrigated, and the knee was re-evaluated for any excess bone debris. The real polyethylene liner, 11 mm thick, was inserted and checked to ensure the locking mechanism had engaged appropriately. The tourniquet was deflated and hemostasis was achieved. The wound was irrigated with normal saline.  One gram of vancomycin powder was placed in the surgical bed.  Topical mixture of 0.25% bupivacaine and meloxicam was placed in the joint for postoperative pain.  Capsular closure was performed with a #1 vicryl in flexion, subcutaneous fat closed with a 0 vicryl suture, then subcutaneous tissue closed with interrupted 2.0 vicryl suture. The skin was then closed with a 2.0 nylon and dermabond. A sterile dressing was applied.  The patient was awakened in the operating room and taken to recovery in stable condition. All sponge, needle, and instrument counts were correct at the end of the case.  Tessa Lerner was necessary for opening,  closing, retracting, limb positioning and overall facilitation and completion of the surgery.  Position: supine  Complications: none.  Time Out: performed   Drains/Packing: none  Estimated blood loss: minimal  Returned to Recovery Room: in good condition.   Antibiotics: yes   Mechanical VTE (DVT) Prophylaxis: sequential compression devices, TED thigh-high  Chemical VTE (DVT) Prophylaxis: aspirin  Fluid Replacement  Crystalloid: see anesthesia record Blood: none  FFP: none   Specimens Removed: 1 to pathology   Sponge and Instrument Count Correct? yes   PACU: portable radiograph - knee AP and Lateral   Plan/RTC: Return in 2 weeks for wound check.   Weight Bearing/Load Lower Extremity: full   Implant Name Type Inv. Item Serial No. Manufacturer Lot No. LRB No. Used Action  CEMENT BONE REFOBACIN R1X40 [place] - [id number] Cement CEMENT BONE REFOBACIN R1X40 [place]  ZIMMER RECON(ORTH,TRAU,BIO,SG) [id number] Left 1 Implanted  CEMENT BONE REFOBACIN R1X40 [place] - [id number] Cement CEMENT BONE REFOBACIN R1X40 [place]  ZIMMER RECON(ORTH,TRAU,BIO,SG) [id number] Left 1 Implanted  COMP FEM CMT PERS SZ4 LT - [id number] Joint COMP FEM CMT PERS SZ4 LT  ZIMMER RECON(ORTH,TRAU,BIO,SG) [id number] Left 1 Implanted  BSPLAT TIB 5D C CMNT STM LT - [id number] Knees BSPLAT TIB 5D C CMNT STM LT  ZIMMER RECON(ORTH,TRAU,BIO,SG) [id number] Left 1 Implanted  STEM TIB ST PERS 14+30 - [id number] Stem STEM TIB ST PERS 14+30  ZIMMER RECON(ORTH,TRAU,BIO,SG) [id number] Left 1 Implanted  STEM POLY PAT PLY 25M KNEE - [id number] Knees  STEM POLY PAT PLY 9M KNEE  ZIMMER RECON(ORTH,TRAU,BIO,SG) [id number] Left 1 Implanted  INSERT ASF PERS 11 CD/4-5 LT - [id number] Insert INSERT ASF PERS 11 CD/4-5 LT  ZIMMER RECON(ORTH,TRAU,BIO,SG) [id number] Left 1 Implanted    N. Glee Arvin, MD [facility] 11:23 AM

## 2023-03-04 NOTE — Evaluation (Signed)
Physical Therapy Evaluation Patient Details Name: Diane Giles MRN: [id number] DOB: [birth year] Today's Date: 03/04/2023  History of Present Illness  67 y.o. female presents to [facility] hospital on 05/28/2022 for elective lt TKA. PMH includes aortic stenosis, OA, DM, gout, OSA, HTN, rt TKR  Clinical Impression  Pt admitted with above diagnosis and presents to PT with functional limitations due to deficits listed below (See PT problem list). Pt needs skilled PT to maximize independence and safety. Today pt still with effects of anesthesia and lt quad not fully awake and knee buckling when up. Pt also nauseous and vomiting. Expect pt will make excellent progress once effects of anesthesia are gone.           If plan is discharge home, recommend the following: A little help with walking and/or transfers;A little help with bathing/dressing/bathroom;Assist for transportation;Assistance with cooking/housework;Help with stairs or ramp for entrance   Can travel by private vehicle        Equipment Recommendations None recommended by PT  Recommendations for Other Services       Functional Status Assessment Patient has had a recent decline in their functional status and demonstrates the ability to make significant improvements in function in a reasonable and predictable amount of time.     Precautions / Restrictions Precautions Precautions: Knee;Fall      Mobility  Bed Mobility Overal bed mobility: Needs Assistance Bed Mobility: Supine to Sit, Sit to Supine     Supine to sit: Min assist, HOB elevated Sit to supine: Min assist, HOB elevated   General bed mobility comments: Assist for LLE    Transfers Overall transfer level: Needs assistance Equipment used: Rolling walker (2 wheels) Transfers: Sit to/from Stand Sit to Stand: Mod assist           General transfer comment: Assist to power up due to lt knee buckling. Attempted stepping to recliner but lt knee buckling and returned  to bed    Ambulation/Gait               General Gait Details: Unable due to lt knee buckling  Stairs            Wheelchair Mobility     Tilt Bed    Modified Rankin (Stroke Patients Only)       Balance Overall balance assessment: Mild deficits observed, not formally tested                                           Pertinent Vitals/Pain Pain Assessment Pain Assessment: Faces Faces Pain Scale: Hurts little more Pain Location: lt knee Pain Descriptors / Indicators: Grimacing, Guarding Pain Intervention(s): Monitored during session, Premedicated before session, Limited activity within patient's tolerance    Home Living Family/patient expects to be discharged to:: Private residence Living Arrangements: Spouse/significant other;Children Available Help at Discharge: Family;Available 24 hours/day Type of Home: House Home Access: Stairs to enter Entrance Stairs-Rails: Right;Left;Can reach both Entrance Stairs-Number of Steps: 3 Alternate Level Stairs-Number of Steps: 15 Home Layout: Two level;Able to live on main level with bedroom/bathroom Home Equipment: Rolling Walker (2 wheels);BSC/3in1      Prior Function Prior Level of Function : Independent/Modified Independent             Mobility Comments: Independent without device       Extremity/Trunk Assessment   Upper Extremity Assessment Upper Extremity Assessment: Overall WFL for  tasks assessed    Lower Extremity Assessment Lower Extremity Assessment: LLE deficits/detail LLE Deficits / Details: Poor quad set, unable to complete long arc quad. AAROM knee 5-90 degrees       Communication   Communication Communication: No apparent difficulties  Cognition Arousal: Alert Behavior During Therapy: WFL for tasks assessed/performed Overall Cognitive Status: Within Functional Limits for tasks assessed                                          General Comments       Exercises Total Joint Exercises Quad Sets: AAROM, Left, 5 reps, Supine Long Arc Quad: AAROM, Left, 5 reps, Seated Knee Flexion: AAROM, Left, 5 reps, Seated Goniometric ROM: 5-90   Assessment/Plan    PT Assessment Patient needs continued PT services  PT Problem List Decreased strength;Decreased range of motion;Decreased mobility;Pain       PT Treatment Interventions DME instruction;Gait training;Stair training;Functional mobility training;Therapeutic activities;Therapeutic exercise;Patient/family education    PT Goals (Current goals can be found in the Care Plan section)  Acute Rehab PT Goals Patient Stated Goal: return home PT Goal Formulation: With patient Time For Goal Achievement: 03/11/23 Potential to Achieve Goals: Good    Frequency 7X/week     Co-evaluation               AM-PAC PT "6 Clicks" Mobility  Outcome Measure Help needed turning from your back to your side while in a flat bed without using bedrails?: A Little Help needed moving from lying on your back to sitting on the side of a flat bed without using bedrails?: A Little Help needed moving to and from a bed to a chair (including a wheelchair)?: A Lot Help needed standing up from a chair using your arms (e.g., wheelchair or bedside chair)?: A Lot Help needed to walk in hospital room?: Total Help needed climbing 3-5 steps with a railing? : Total 6 Click Score: 12    End of Session   Activity Tolerance: Treatment limited secondary to medical complications (Comment);Other (comment) (nausea and lingering effects of anesthesia) Patient left: in bed;with call bell/phone within reach;with family/visitor present Nurse Communication: Mobility status;Other (comment) (pt with nausea and vomiting) PT Visit Diagnosis: Other abnormalities of gait and mobility (R26.89);Difficulty in walking, not elsewhere classified (R26.2);Pain Pain - Right/Left: Left Pain - part of body: Knee    Time: 2130-8657 PT Time  Calculation (min) (ACUTE ONLY): 25 min   Charges:   PT Evaluation $PT Eval Low Complexity: 1 Low PT Treatments $Therapeutic Activity: 8-22 mins PT General Charges $$ ACUTE PT VISIT: 1 Visit         [facility] PT Acute Rehabilitation Services Office [phone]   Angelina Ok [facility] 03/04/2023, 5:36 PM

## 2023-03-04 NOTE — Plan of Care (Signed)
  Problem: Activity: Goal: Ability to avoid complications of mobility impairment will improve Outcome: Progressing   Problem: Clinical Measurements: Goal: Postoperative complications will be avoided or minimized Outcome: Progressing   Problem: Pain Management: Goal: Pain level will decrease with appropriate interventions Outcome: Progressing   

## 2023-03-04 NOTE — H&P (Signed)
PREOPERATIVE H&P  Chief Complaint: left knee osteoarthritis  HPI: Diane Giles is a 67 y.o. female who presents for surgical treatment of left knee osteoarthritis.  She denies any changes in medical history.  Past Surgical History:  Procedure Laterality Date   ABDOMINAL AORTOGRAM W/LOWER EXTREMITY N/A 12/03/2016   Procedure: Abdominal Aortogram w/Lower Extremity;  Surgeon: Chuck Hint, MD;  Location: [facility] INVASIVE CV LAB;  Service: Cardiovascular;  Laterality: N/A;   AORTA -INNOMIATE BYPASS N/A 05/18/2019   Procedure: AORTA -INNOMIATE BYPASS Using Hemashield Gold Graft Size 8mm;  Surgeon: Alleen Borne, MD;  Location: [facility] OR;  Service: Open Heart Surgery;  Laterality: N/A;   aorto- fem bypass Bilateral    aortobiiliac bypass in 2000   DILATATION & CURETTAGE/HYSTEROSCOPY WITH MYOSURE N/A 12/27/2015   Procedure: DILATATION & CURETTAGE/HYSTEROSCOPY;  Surgeon: Patton Salles, MD;  Location: [facility] ORS;  Service: Gynecology;  Laterality: N/A;   DILATION AND CURETTAGE OF UTERUS  11/2015   ELBOW SURGERY Left ~2007   tendon repair by Dr. Ranell Patrick   EYE SURGERY Bilateral 2008   lasik   JOINT REPLACEMENT     lower aortic bypass  2000   per patient   PERIPHERAL VASCULAR INTERVENTION  12/03/2016   Stenting of anastomotic stenosis of the aortoiliac and right common iliac artery with 7 x 39 mm VBX covered stent and postdilated with 9 mm balloon.   Right innonimate bypass  05/18/2019   Aorto innominate bypass surgery with median sternotomy   STERNOTOMY  05/18/2019   Right aorto-innonimate bypass surgery 05/18/19 Dr. Laneta Simmers   TOTAL HIP ARTHROPLASTY Right 07/27/2014   Procedure: RIGHT TOTAL HIP ARTHROPLASTY ANTERIOR APPROACH;  Surgeon: Durene Romans, MD;  Location: [facility] ORS;  Service: Orthopedics;  Laterality: Right;   TOTAL KNEE ARTHROPLASTY Right 05/28/2022   Procedure: RIGHT TOTAL KNEE ARTHROPLASTY;  Surgeon: Tarry Kos, MD;  Location: [facility] OR;  Service: Orthopedics;   Laterality: Right;   Social History   Socioeconomic History   Marital status: Married    Spouse name: Not on file   Number of children: 5   Years of education: Not on file   Highest education level: Not on file  Occupational History   Occupation: Tree surgeon  Tobacco Use   Smoking status: Former    Current packs/day: 0.00    Average packs/day: 0.5 packs/day for 40.0 years (20.0 ttl pk-yrs)    Types: Cigarettes    Start date: 02/23/1977    Quit date: 02/23/2017    Years since quitting: 6.0   Smokeless tobacco: Never  Vaping Use   Vaping status: Never Used  Substance and Sexual Activity   Alcohol use: Yes    Comment: very rare--maybe once a year   Drug use: No   Sexual activity: Yes    Partners: Male    Birth control/protection: Post-menopausal  Other Topics Concern   Not on file  Social History Narrative   Married. Patient does not exercise. She has a Geographical information systems officer.   Right handed   Lives with husband in two story home   Social Determinants of Health   Financial Resource Strain: Not on file  Food Insecurity: No Food Insecurity (05/29/2022)   Hunger Vital Sign    Worried About Running Out of Food in the Last Year: Never true    Ran Out of Food in the Last Year: Never true  Transportation Needs: No Transportation Needs (05/29/2022)   PRAPARE - Transportation    Lack  of Transportation (Medical): No    Lack of Transportation (Non-Medical): No  Physical Activity: Not on file  Stress: Not on file  Social Connections: Unknown (09/19/2021)   [facility]   Social Network    Social Network: Not on file   Family History  Problem Relation Age of Onset   Migraines Mother    Thyroid disease Mother        hypothyroid   Cancer Father 8       Dec age 54 with pancreatic Ca   Allergies  Allergen Reactions   Codeine Itching   Prior to Admission medications   Medication Sig Start Date End Date Taking? Authorizing Provider  allopurinol  (ZYLOPRIM) 100 MG tablet Take 100 mg by mouth in the morning.   Yes [provider]  atenolol (TENORMIN) 50 MG tablet Take 25 mg by mouth in the morning. 10/13/21  Yes [provider]  atorvastatin (LIPITOR) 80 MG tablet Take 1 tablet (80 mg total) by mouth daily at 6 PM. 08/16/17  Yes Sherren Mocha, MD  buPROPion (WELLBUTRIN XL) 150 MG 24 hr tablet Take 150 mg by mouth every morning. 04/25/22  Yes [provider]  ezetimibe (ZETIA) 10 MG tablet Take 1 tablet (10 mg total) by mouth daily. 01/18/23 01/13/24 Yes Yates Decamp, MD  OZEMPIC, 0.25 OR 0.5 MG/DOSE, 2 MG/3ML SOPN Inject 0.5 mg into the skin once a week. 05/16/22  Yes [provider]  pregabalin (LYRICA) 75 MG capsule Take 75 mg by mouth 2 (two) times daily. 01/01/20  Yes [provider]  acetaminophen (TYLENOL) 500 MG tablet Take 500-1,000 mg by mouth 3 (three) times daily as needed for mild pain (pain score 1-3).    [provider]  aspirin EC 81 MG tablet Take 1 tablet (81 mg total) by mouth 2 (two) times daily. To be taken after surgery 02/25/23 02/25/24  Cristie Hem, PA-C  CALCIUM 600 1500 (600 Ca) MG TABS tablet Take 1 tablet by mouth daily. 02/18/23   [provider]  Cholecalciferol (VITAMIN D3) 250 MCG (10000 UT) capsule Take 10,000 Units by mouth daily.    [provider]  clobetasol (TEMOVATE) 0.05 % GEL Apply 1 Application topically daily.    [provider]  HYDROcodone-acetaminophen (NORCO) 7.5-325 MG tablet Take 1-2 tablets by mouth every 6 (six) hours as needed for moderate pain. Patient taking differently: Take 1-2 tablets by mouth every 6 (six) hours as needed for moderate pain (pain score 4-6). Pt hasn't taken this medication yet. 05/29/22   Cristie Hem, PA-C  Lancets MISC Use as directed once a day. (DX: R73.9) 01/20/15   Sherren Mocha, MD  losartan (COZAAR) 50 MG tablet Take 50 mg by mouth daily.    [provider]  magnesium oxide (MAG-OX)  400 (240 Mg) MG tablet Take 400 mg by mouth daily.    [provider]  methocarbamol (ROBAXIN-750) 750 MG tablet Take 1 tablet (750 mg total) by mouth 2 (two) times daily as needed for muscle spasms. 02/25/23   Cristie Hem, PA-C  ondansetron (ZOFRAN) 4 MG tablet Take 1 tablet (4 mg total) by mouth daily as needed for nausea or vomiting. Patient taking differently: Take 4 mg by mouth daily as needed for nausea or vomiting. Pt hasn't taken this medication yet. 02/25/23 02/25/24  Cristie Hem, PA-C  oxyCODONE-acetaminophen (PERCOCET) 5-325 MG tablet Take 1-2 tablets by mouth every 6 (six) hours as needed. Patient  taking differently: Take 1-2 tablets by mouth every 6 (six) hours as needed. Pt hasn't taken this medication yet. 02/25/23   Cristie Hem, PA-C     Positive ROS: All other systems have been reviewed and were otherwise negative with the exception of those mentioned in the HPI and as above.  Physical Exam: General: Alert, no acute distress Cardiovascular: No pedal edema Respiratory: No cyanosis, no use of accessory musculature GI: abdomen soft Skin: No lesions in the area of chief complaint Neurologic: Sensation intact distally Psychiatric: Patient is competent for consent with normal mood and affect Lymphatic: no lymphedema  MUSCULOSKELETAL: exam stable  Assessment: left knee osteoarthritis  Plan: Plan for Procedure(s): TOTAL KNEE ARTHROPLASTY  The risks benefits and alternatives were discussed with the patient including but not limited to the risks of nonoperative treatment, versus surgical intervention including infection, bleeding, nerve injury,  blood clots, cardiopulmonary complications, morbidity, mortality, among others, and they were willing to proceed.   Glee Arvin, MD 03/04/2023 7:11 AM

## 2023-03-05 ENCOUNTER — Encounter: Payer: Self-pay | Admitting: Orthopaedic Surgery

## 2023-03-05 ENCOUNTER — Encounter (HOSPITAL_COMMUNITY): Payer: Self-pay | Admitting: Orthopaedic Surgery

## 2023-03-05 DIAGNOSIS — M1712 Unilateral primary osteoarthritis, left knee: Secondary | ICD-10-CM | POA: Diagnosis not present

## 2023-03-05 MED ORDER — SODIUM CHLORIDE 0.45 % IV BOLUS
1000.0000 mL | Freq: Once | INTRAVENOUS | Status: AC
  Administered 2023-03-05: 1000 mL via INTRAVENOUS

## 2023-03-05 NOTE — Progress Notes (Signed)
Left an updated message about patient on [place] line, waiting on a call back to see if patient can discharge home.

## 2023-03-05 NOTE — Progress Notes (Signed)
Physical Therapy Treatment Patient Details Name: Diane Giles MRN: [id number] DOB: November 28, 1955 Today's Date: 03/05/2023   History of Present Illness 67 y.o. female presents to [facility] hospital on 05/28/2022 for elective L TKA. PMH includes aortic stenosis, OA, DM, gout, OSA, HTN, R TKR.    PT Comments  Pt received in recliner, pt agreeable to therapy session and with good participation and improved tolerance for transfer, gait and stair training. Pt performed 12 steps with CGA for safety and single rail and spouse present for instruction on guarding strategies. Pt CGA for safety only with transfers and gait using RW and no buckling or acute s/sx distress. BP stable per OT from session just prior to second PT session and SpO2/HR [facility] on RA. Pt given gait belt for home, anticipate pt safe to DC home from a functional mobility standpoint once medically cleared.    If plan is discharge home, recommend the following: A little help with bathing/dressing/bathroom;Assist for transportation;Assistance with cooking/housework;Help with stairs or ramp for entrance;A little help with walking and/or transfers (stand-by assist for safety with stairs/gait)   Can travel by private vehicle        Equipment Recommendations  None recommended by PT (pt reports she has RW and BSC already)    Recommendations for Other Services       Precautions / Restrictions Precautions Precautions: Knee;Fall Precaution Booklet Issued: Yes (comment) Precaution Comments: Orthostatic during PT eval, watch BP, knees buckle with ambulation Required Braces or Orthoses:  (good quad contraction but may want to consider L KI due to buckling x3 episodes during session - BP also soft, reassess in second session) Restrictions Weight Bearing Restrictions: Yes LLE Weight Bearing: Weight bearing as tolerated     Mobility  Bed Mobility Overal bed mobility: Needs Assistance Bed Mobility: Supine to Sit     Supine to sit: Supervision      General bed mobility comments: in recliner before/after session    Transfers Overall transfer level: Needs assistance Equipment used: Rolling walker (2 wheels) Transfers: Sit to/from Stand, Bed to chair/wheelchair/BSC Sit to Stand: Supervision           General transfer comment: supervision, no LOB    Ambulation/Gait Ambulation/Gait assistance: Contact guard assist Gait Distance (Feet): 200 Feet Assistive device: Rolling walker (2 wheels) Gait Pattern/deviations: Step-through pattern Gait velocity: <0.4 m/s, limited distance 2/2 buckling     General Gait Details: Good cadence and step length, good proximity to RW, SpO2/HR WFL on RA. No acute s/sx distress or buckling in PM session.   Stairs Stairs: Yes Stairs assistance: Contact guard assist Stair Management: One rail Right, Step to pattern, Forwards Number of Stairs: 12 General stair comments: Initial cues for step sequencing and pt with good carryover, no buckling or LOB. SpO2/HR WFL on RA.   Wheelchair Mobility     Tilt Bed    Modified Rankin (Stroke Patients Only)       Balance Overall balance assessment: Modified Independent (with RW)                                          Cognition Arousal: Alert Behavior During Therapy: WFL for tasks assessed/performed Overall Cognitive Status: Within Functional Limits for tasks assessed  General Comments: Pt without c/o signs/symptoms of dizziness but buckling, she does report maybe initial mild dizziness upon first standing but stated it went away right away.        Exercises Total Joint Exercises Ankle Circles/Pumps: AROM, Both, 10 reps, Supine Quad Sets: 5 reps, Supine, AROM, Both Towel Squeeze: AROM, Both, 5 reps, Supine Short Arc Quad: AROM, Left, 5 reps, Supine Heel Slides: AROM, Left, 10 reps, Supine Hip ABduction/ADduction: AROM, Left, 10 reps, Supine Straight Leg Raises: AROM,  Left, 10 reps, Supine (<10* quad lag on LLE) Long Arc Quad: Left, Seated, AROM, 10 reps Knee Flexion: Left, 5 reps, Seated, AROM, AAROM (x5 reps AROM and x5 reps with RLE overpressure) Goniometric ROM: 5 degrees to 100 degrees L knee flexion ROM sitting EOB Marching in Standing: AROM, Left, 5 reps, Standing    General Comments General comments (skin integrity, edema, etc.): SpO2/HR WFL on RA. No buckling in PM session and BP was stable just prior to session when OT checked orthostatics, so not further assessed; per pt, she had no dizziness this session or since she had lunch and finished her IV bolus.      Pertinent Vitals/Pain Pain Assessment Pain Assessment: Faces Pain Score: 2  Faces Pain Scale: Hurts a little bit Pain Location: L knee Pain Descriptors / Indicators: Sore Pain Intervention(s): Monitored during session    Home Living Family/patient expects to be discharged to:: Private residence Living Arrangements: Spouse/significant other Available Help at Discharge: Family;Available 24 hours/day Type of Home: House Home Access: Stairs to enter Entrance Stairs-Rails: Can reach both Entrance Stairs-Number of Steps: 3 Alternate Level Stairs-Number of Steps: 15 Home Layout: Two level;Able to live on main level with bedroom/bathroom Home Equipment: Rolling Walker (2 wheels);BSC/3in1;Shower seat Additional Comments: Pt lives with husband, tub shower downstairs, has shower chair with glass shower doors    Prior Function            PT Goals (current goals can now be found in the care plan section) Acute Rehab PT Goals Patient Stated Goal: return home PT Goal Formulation: With patient Time For Goal Achievement: 03/11/23 Progress towards PT goals: Progressing toward goals    Frequency    7X/week      PT Plan      Co-evaluation              AM-PAC PT "6 Clicks" Mobility   Outcome Measure  Help needed turning from your back to your side while in a flat bed  without using bedrails?: None Help needed moving from lying on your back to sitting on the side of a flat bed without using bedrails?: None Help needed moving to and from a bed to a chair (including a wheelchair)?: A Little Help needed standing up from a chair using your arms (e.g., wheelchair or bedside chair)?: A Little Help needed to walk in hospital room?: A Little Help needed climbing 3-5 steps with a railing? : A Little 6 Click Score: 20    End of Session Equipment Utilized During Treatment: Gait belt Activity Tolerance: Patient tolerated treatment well Patient left: in chair;with call bell/phone within reach;with family/visitor present (spouse in room) Nurse Communication: Mobility status PT Visit Diagnosis: Other abnormalities of gait and mobility (R26.89);Difficulty in walking, not elsewhere classified (R26.2);Pain Pain - Right/Left: Left Pain - part of body: Knee     Time: 7829-5621 PT Time Calculation (min) (ACUTE ONLY): 12 min  Charges:    $Gait Training: 8-22 mins  PT General Charges $$  ACUTE PT VISIT: 1 Visit                     Jakyrie Totherow P., PTA Acute Rehabilitation Services Secure Chat Preferred 9a-5:30pm Office: [phone]    Dorathy Kinsman [facility] 03/05/2023, 3:46 PM

## 2023-03-05 NOTE — Progress Notes (Signed)
Physical Therapy Treatment Patient Details Name: Diane Giles MRN: [id number] DOB: 04/19/1956 Today's Date: 03/05/2023   History of Present Illness 67 y.o. female presents to [facility] hospital on 05/28/2022 for elective lt TKA. PMH includes aortic stenosis, OA, DM, gout, OSA, HTN, rt TKR    PT Comments  Pt received in supine, pleasantly agreeable to therapy session and with good participation and tolerance for transfer training. Pt standing tolerance limited due to initial LLE buckling and when pt sat to rest, BP checked and pt found to have asymptomatic orthostatic hypotension. Pt transferred from bed to chair to eat lunch and nursing staff notified of pt LLE buckling and not yet safe to ambulate in room, will need to perform stand pivot transfers only with RW until BP stability improves. Could consider LLE knee immobilizer for following sessions, although pt with fair L quad strength and performed SLR with <10* quad lag, so likely more due to BP instability. Pt/spouse agreeable to additional session of PT in afternoon as she is not yet safe to DC home from a functional standpoint with soft BP and LE buckling, RN notified. \  Vital Signs  Patient Position (if appropriate) Orthostatic Vitals  Orthostatic Sitting  BP- Sitting 107/63 ((69))  Pulse- Sitting 66  Orthostatic Standing at 0 minutes  BP- Standing at 0 minutes 97/51 (66)  Pulse- Standing at 0 minutes 75  Orthostatic Standing at 3 minutes  BP- Standing at 3 minutes 93/48 (61)  Pulse- Standing at 3 minutes 78   Orthostatic Lying   BP- Lying 124/43 (68)  Pulse- Lying 64 (sitting in recliner after standing readings taken)      If plan is discharge home, recommend the following: A little help with bathing/dressing/bathroom;Assist for transportation;Assistance with cooking/housework;Help with stairs or ramp for entrance;A lot of help with walking and/or transfers (+2 safety currently due to LLE buckling; anticipate improved stability  and pt will only need "a little" help once BP more stable.)   Can travel by private vehicle        Equipment Recommendations  None recommended by PT (TBD, pt states she has RW and BSC)    Recommendations for Other Services       Precautions / Restrictions Precautions Precautions: Knee;Fall Precaution Booklet Issued: Yes (comment) Required Braces or Orthoses:  (good quad contraction but may want to consider L KI due to buckling x3 episodes during session - BP also soft, reassess in second session) Restrictions Weight Bearing Restrictions: Yes LLE Weight Bearing: Weight bearing as tolerated     Mobility  Bed Mobility Overal bed mobility: Needs Assistance Bed Mobility: Supine to Sit     Supine to sit: Supervision     General bed mobility comments: no assist needed, increased time, HOB elevated partially    Transfers Overall transfer level: Needs assistance Equipment used: Rolling walker (2 wheels) Transfers: Sit to/from Stand Sit to Stand: Contact guard assist, Min assist           General transfer comment: from EOB<>RW x2, no buckling, pt reports mild dizziness that resolves "immediately" so performed some steps toward bathroom door next to bed, pt with LLE buckling while turning, but pt reports no symptoms. Pt up in recliner at end of session, encouraged her to eat lunch which had just arrived and RN notified of pt LE buckling.    Ambulation/Gait Ambulation/Gait assistance: Mod assist, +2 safety/equipment Gait Distance (Feet): 15 Feet (including brief standing breaks) Assistive device: Rolling walker (2 wheels) Gait Pattern/deviations: Step-to pattern,  Decreased stride length, Knees buckling (L knee buckling on multiple steps) Gait velocity: <0.4 m/s, limited distance 2/2 buckling     General Gait Details: after inital standing with "very mild" feeling of dizziness which pt reports immediately resolved, no c/o dizziness so pt took a few steps forward and when  attempting to turn to her L, LLE buckling. Pt assisted to standing upright, chair pulled up closer behind her, and pt walked ~10 more feet prior to sudden LLE buckling again, so chair pulled up behind her for safety. BP then checked sitting to standing and then reclined, pt found to have mostly asymptomatic orthostatic hypotension, RN notified, entered VS into flowsheet.   Stairs Stairs:  (defer for pt safety due to buckling)           Wheelchair Mobility     Tilt Bed    Modified Rankin (Stroke Patients Only)       Balance Overall balance assessment: Mild deficits observed, not formally tested                                          Cognition Arousal: Alert Behavior During Therapy: WFL for tasks assessed/performed Overall Cognitive Status: Within Functional Limits for tasks assessed                                 General Comments: Pt without c/o signs/symptoms of dizziness but buckling, she does report maybe initial mild dizziness upon first standing but stated it went away right away.        Exercises Total Joint Exercises Ankle Circles/Pumps: AROM, Both, 10 reps, Supine Quad Sets: 5 reps, Supine, AROM, Both Towel Squeeze: AROM, Both, 5 reps, Supine Short Arc Quad: AROM, Left, 5 reps, Supine Heel Slides: AROM, Left, 10 reps, Supine Hip ABduction/ADduction: AROM, Left, 10 reps, Supine Straight Leg Raises: AROM, Left, 10 reps, Supine (<10* quad lag on LLE) Long Arc Quad: Left, Seated, AROM, 10 reps Knee Flexion: Left, 5 reps, Seated, AROM, AAROM (x5 reps AROM and x5 reps with RLE overpressure) Goniometric ROM: 5 degrees to 100 degrees L knee flexion ROM sitting EOB Marching in Standing: AROM, Left, 5 reps, Standing    General Comments General comments (skin integrity, edema, etc.): SpO2 and HR WFL on RA throughout. Pt with thigh-high TED hose donned on BLE throughout, incision dressing appears c/d/i.      Pertinent Vitals/Pain  Pain Assessment Pain Assessment: Faces Faces Pain Scale: Hurts a little bit Pain Location: L knee Pain Descriptors / Indicators: Sore Pain Intervention(s): Monitored during session, Limited activity within patient's tolerance, Repositioned           PT Goals (current goals can now be found in the care plan section) Acute Rehab PT Goals Patient Stated Goal: return home PT Goal Formulation: With patient Time For Goal Achievement: 03/11/23 Progress towards PT goals: Progressing toward goals    Frequency    7X/week      PT Plan         AM-PAC PT "6 Clicks" Mobility   Outcome Measure  Help needed turning from your back to your side while in a flat bed without using bedrails?: None Help needed moving from lying on your back to sitting on the side of a flat bed without using bedrails?: A Little Help needed moving to and from  a bed to a chair (including a wheelchair)?: A Lot (for safety) Help needed standing up from a chair using your arms (e.g., wheelchair or bedside chair)?: A Little Help needed to walk in hospital room?: A Lot (+2 due to buckling intermittently) Help needed climbing 3-5 steps with a railing? : Total 6 Click Score: 15    End of Session Equipment Utilized During Treatment: Gait belt Activity Tolerance: Other (comment);Patient tolerated treatment well;Treatment limited secondary to medical complications (Comment) (orthostatic drop in BP from supine to standing, LLE buckling, may need L KI for stability/safety?) Patient left: with call bell/phone within reach;with family/visitor present;in chair (spouse in room) Nurse Communication: Mobility status;Other (comment) (orthostatic drop in BP from supine to standing, LLE buckling, may need L KI for stability/safety for PM session? safer to have pt perform stand pivot with RW, not yet safe to walk to bathroom with nursing staff. same for return to bed.) PT Visit Diagnosis: Other abnormalities of gait and mobility  (R26.89);Difficulty in walking, not elsewhere classified (R26.2);Pain Pain - Right/Left: Left Pain - part of body: Knee     Time: 1152-1227 PT Time Calculation (min) (ACUTE ONLY): 35 min  Charges:    $Therapeutic Exercise: 8-22 mins $Therapeutic Activity: 8-22 mins PT General Charges $$ ACUTE PT VISIT: 1 Visit                     Bradleigh Sonnen P., PTA Acute Rehabilitation Services Secure Chat Preferred 9a-5:30pm Office: [phone]    Dorathy Kinsman [facility] 03/05/2023, 12:50 PM

## 2023-03-05 NOTE — Evaluation (Addendum)
Occupational Therapy Evaluation Patient Details Name: TEMPEST HANNASCH MRN: [id number] DOB: October 18, 1955 Today's Date: 03/05/2023   History of Present Illness 67 y.o. female presents to [facility] hospital on 05/28/2022 for elective lt TKA. PMH includes aortic stenosis, OA, DM, gout, OSA, HTN, rt TKR   Clinical Impression   Pt in good spirits, feeling much better. No dizziness during session, BP doing better(see general comments), no knee buckling. Pt husband present during session. Pt PLOF independent, live with husband in 2 story house, able to live on first floor, 3 steps to enter home. Currently Pt displays good balance, normal gait, minimal pain, set up for ADLs, able to stand without RW and complete standing ADLs. Pt was able to ambulate ~300 feet with normal gait, completed 12 stairs up/down with supervision, instructed on safety awareness. Pt and husband state they have all support and DME at home needed to remain safe. Pt has no further acute care needs, follow physicians orders for follow up therapies.       If plan is discharge home, recommend the following: A little help with walking and/or transfers;A little help with bathing/dressing/bathroom;Direct supervision/assist for medications management;Help with stairs or ramp for entrance;Assist for transportation    Functional Status Assessment  Patient has had a recent decline in their functional status and demonstrates the ability to make significant improvements in function in a reasonable and predictable amount of time.  Equipment Recommendations  None recommended by OT    Recommendations for Other Services       Precautions / Restrictions Precautions Precautions: Knee;Fall Precaution Booklet Issued: Yes (comment) Precaution Comments: orthostatic during PT eval, watch BP, knees buckle with ambulation Required Braces or Orthoses:  (good quad contraction but may want to consider L KI due to buckling x3 episodes during session - BP also  soft, reassess in second session) Restrictions Weight Bearing Restrictions: Yes LLE Weight Bearing: Weight bearing as tolerated      Mobility Bed Mobility Overal bed mobility: Needs Assistance             General bed mobility comments: in recliner    Transfers Overall transfer level: Needs assistance Equipment used: Rolling walker (2 wheels) Transfers: Sit to/from Stand, Bed to chair/wheelchair/BSC Sit to Stand: Supervision     Step pivot transfers: Supervision     General transfer comment: supervision, no LOB, normal gait/balance      Balance Overall balance assessment: Modified Independent                                         ADL either performed or assessed with clinical judgement   ADL Overall ADL's : Needs assistance/impaired                                       General ADL Comments: Pt set up/supervision for ADLs/transfers     Vision Baseline Vision/History: 1 Wears glasses Ability to See in Adequate Light: 0 Adequate Patient Visual Report: No change from baseline       Perception         Praxis         Pertinent Vitals/Pain Pain Assessment Pain Assessment: 0-10 Pain Score: 3  Pain Location: L knee Pain Descriptors / Indicators: Sore Pain Intervention(s): Monitored during session     Extremity/Trunk Assessment Upper Extremity  Assessment Upper Extremity Assessment: Overall WFL for tasks assessed           Communication Communication Communication: No apparent difficulties   Cognition Arousal: Alert Behavior During Therapy: WFL for tasks assessed/performed Overall Cognitive Status: Within Functional Limits for tasks assessed                                       General Comments  BP tested twice. sitting before/after [phone] and 139/71. Standing before/after activity 121/50, 132/63. no LOB, no dizziness, ambulated 300 feet, 12 stairs.    Exercises     Shoulder  Instructions      Home Living Family/patient expects to be discharged to:: Private residence Living Arrangements: Spouse/significant other Available Help at Discharge: Family;Available 24 hours/day Type of Home: House Home Access: Stairs to enter [employer] of Steps: 3 Entrance Stairs-Rails: Can reach both Home Layout: Two level;Able to live on main level with bedroom/bathroom Alternate Level Stairs-Number of Steps: 15   Bathroom Shower/Tub: Tub/shower unit;Walk-in shower         Home Equipment: Agricultural consultant (2 wheels);BSC/3in1;Shower seat   Additional Comments: Pt lives with husband, tub shower downstairs, has shower chair with glass shower doors      Prior Functioning/Environment Prior Level of Function : Independent/Modified Independent                        OT Problem List: Decreased strength;Impaired balance (sitting and/or standing);Pain      OT Treatment/Interventions:      OT Goals(Current goals can be found in the care plan section) Acute Rehab OT Goals Patient Stated Goal: to return home OT Goal Formulation: With patient/family Time For Goal Achievement: 03/19/23 Potential to Achieve Goals: Good  OT Frequency:      Co-evaluation              AM-PAC OT "6 Clicks" Daily Activity     Outcome Measure Help from another person eating meals?: None Help from another person taking care of personal grooming?: A Little Help from another person toileting, which includes using toliet, bedpan, or urinal?: A Little Help from another person bathing (including washing, rinsing, drying)?: A Little Help from another person to put on and taking off regular upper body clothing?: A Little Help from another person to put on and taking off regular lower body clothing?: A Little 6 Click Score: 19   End of Session Equipment Utilized During Treatment: Gait belt;Rolling walker (2 wheels) Nurse Communication: Mobility status;Other (comment) (BP  WNLs)  Activity Tolerance: Patient tolerated treatment well Patient left: in chair;with call bell/phone within reach;with family/visitor present  OT Visit Diagnosis: Unsteadiness on feet (R26.81);Pain Pain - Right/Left: Left Pain - part of body: Knee                Time: 5621-3086 OT Time Calculation (min): 35 min Charges:  OT General Charges $OT Visit: 1 Visit OT Evaluation $OT Eval Low Complexity: 1 Low OT Treatments $Self Care/Home Management : 8-22 mins  [place], OTR/L   Alexis Goodell 03/05/2023, 2:55 PM

## 2023-03-05 NOTE — Discharge Summary (Signed)
Patient ID: NORMIE BIRKS MRN: [id number] DOB/AGE: 69/07/1955 68 y.o.  Admit date: 03/04/2023 Discharge date: 03/05/2023  Admission Diagnoses:  Principal Problem:   Primary osteoarthritis of left knee Active Problems:   Status post total left knee replacement   Discharge Diagnoses:  Same  Past Medical History:  Diagnosis Date   Aortic stenosis    mild-moderate AS 04/22/19 echo   Aortoiliac occlusive disease (HCC) 12/03/2016   Arthritis    Diabetes mellitus without complication (HCC)    pt states she has been told she is no longer diabetic and is not taking meds   Diverticulitis    Gout    Heart murmur    Hyperlipidemia    Hypertension    Osteoporosis    Sleep apnea    Tobacco use disorder 07/04/2012    Surgeries: Procedure(s): TOTAL KNEE ARTHROPLASTY on 03/04/2023   Consultants:   Discharged Condition: Improved  Hospital Course: MAUDINE MCASKILL is an 67 y.o. female who was admitted 03/04/2023 for operative treatment ofPrimary osteoarthritis of left knee. Patient has severe unremitting pain that affects sleep, daily activities, and work/hobbies. After pre-op clearance the patient was taken to the operating room on 03/04/2023 and underwent  Procedure(s): TOTAL KNEE ARTHROPLASTY.    Patient was given perioperative antibiotics:  Anti-infectives (From admission, onward)    Start     Dose/Rate Route Frequency Ordered Stop   03/04/23 1530  ceFAZolin (ANCEF) IVPB 2g/100 mL premix        2 g 200 mL/hr over 30 Minutes Intravenous Every 6 hours 03/04/23 1439 03/04/23 2052   03/04/23 1033  vancomycin (VANCOCIN) powder  Status:  Discontinued          As needed 03/04/23 1033 03/04/23 1154   03/04/23 0830  ceFAZolin (ANCEF) IVPB 2g/100 mL premix        2 g 200 mL/hr over 30 Minutes Intravenous On call to O.R. 03/04/23 0719 03/04/23 1015        Patient was given sequential compression devices, early ambulation, and chemoprophylaxis to prevent DVT.  Patient benefited  maximally from hospital stay and there were no complications.    Recent vital signs: Patient Vitals for the past 24 hrs:  BP Temp Temp src Pulse Resp SpO2  03/05/23 0950 (!) 102/50 98.3 F (36.8 C) Oral 70 15 95 %  03/05/23 0450 (!) 106/53 97.7 F (36.5 C) Oral 72 18 95 %  03/04/23 1931 (!) 106/54 (!) 97.1 F (36.2 C) Oral (!) 59 -- 100 %  03/04/23 1404 (!) 121/57 -- -- (!) 57 18 99 %  03/04/23 1330 (!) 129/54 97.6 F (36.4 C) -- (!) 55 18 97 %  03/04/23 1315 (!) 128/49 -- -- (!) 51 16 96 %  03/04/23 1300 (!) 116/53 -- -- (!) 50 16 97 %  03/04/23 1245 (!) 124/48 -- -- (!) 55 12 95 %  03/04/23 1230 (!) 124/51 -- -- (!) 55 12 99 %  03/04/23 1215 (!) 110/46 -- -- (!) 56 10 96 %  03/04/23 1200 (!) 96/53 -- -- 63 12 95 %  03/04/23 1156 (!) 101/41 97.6 F (36.4 C) -- 66 14 95 %  03/04/23 1153 -- 97.6 F (36.4 C) -- -- -- --     Recent laboratory studies: No results for input(s): "WBC", "HGB", "HCT", "PLT", "NA", "K", "CL", "CO2", "BUN", "CREATININE", "GLUCOSE", "INR", "CALCIUM" in the last 72 hours.  Invalid input(s): "PT", "2"   Discharge Medications:   Allergies as of 03/05/2023  Reactions   Codeine Itching        Medication List     STOP taking these medications    acetaminophen 500 MG tablet Commonly known as: TYLENOL   HYDROcodone-acetaminophen 7.5-325 MG tablet Commonly known as: Norco       TAKE these medications    allopurinol 100 MG tablet Commonly known as: ZYLOPRIM Take 100 mg by mouth in the morning.   aspirin EC 81 MG tablet Take 1 tablet (81 mg total) by mouth 2 (two) times daily. To be taken after surgery   atenolol 50 MG tablet Commonly known as: TENORMIN Take 25 mg by mouth in the morning.   atorvastatin 80 MG tablet Commonly known as: LIPITOR Take 1 tablet (80 mg total) by mouth daily at 6 PM.   buPROPion 150 MG 24 hr tablet Commonly known as: WELLBUTRIN XL Take 150 mg by mouth every morning.   Calcium 600 1500 (600 Ca) MG  Tabs tablet Generic drug: calcium carbonate Take 1 tablet by mouth daily.   clobetasol 0.05 % Gel Commonly known as: TEMOVATE Apply 1 Application topically daily.   ezetimibe 10 MG tablet Commonly known as: ZETIA Take 1 tablet (10 mg total) by mouth daily.   Lancets Misc Use as directed once a day. (DX: R73.9)   losartan 50 MG tablet Commonly known as: COZAAR Take 50 mg by mouth daily.   magnesium oxide 400 (240 Mg) MG tablet Commonly known as: MAG-OX Take 400 mg by mouth daily.   methocarbamol 750 MG tablet Commonly known as: Robaxin-750 Take 1 tablet (750 mg total) by mouth 2 (two) times daily as needed for muscle spasms.   ondansetron 4 MG tablet Commonly known as: Zofran Take 1 tablet (4 mg total) by mouth daily as needed for nausea or vomiting. What changed: additional instructions   oxyCODONE-acetaminophen 5-325 MG tablet Commonly known as: Percocet Take 1-2 tablets by mouth every 6 (six) hours as needed. What changed: additional instructions   Ozempic (0.25 or 0.5 MG/DOSE) 2 MG/3ML Sopn Generic drug: Semaglutide(0.25 or 0.5MG /DOS) Inject 0.5 mg into the skin once a week.   pregabalin 75 MG capsule Commonly known as: LYRICA Take 75 mg by mouth 2 (two) times daily.   Vitamin D3 250 MCG (10000 UT) capsule Take 10,000 Units by mouth daily.               Durable Medical Equipment  (From admission, onward)           Start     Ordered   03/04/23 1440  DME Walker rolling  Once       Question Answer Comment  Walker: With 5 Inch Wheels   Patient needs a walker to treat with the following condition Status post left partial knee replacement      03/04/23 1439   03/04/23 1440  DME 3 n 1  Once        03/04/23 1439   03/04/23 1440  DME Bedside commode  Once       Question:  Patient needs a bedside commode to treat with the following condition  Answer:  Status post left partial knee replacement   03/04/23 1439            Diagnostic Studies: DG  Knee Left Port  Result Date: 03/04/2023 CLINICAL DATA:  Status post left knee arthroplasty. EXAM: PORTABLE LEFT KNEE - 1-2 VIEW COMPARISON:  Left knee radiographs 11/20/2022 FINDINGS: Interval total left knee arthroplasty. No perihardware lucency is seen to  indicate hardware failure or loosening. Small joint effusion. Expected postoperative intra-articular and subcutaneous air. Calcification is again seen along the course of the distal quadriceps and mid patellar tendons. No acute fracture or dislocation. Mild atherosclerotic calcifications. IMPRESSION: Interval total left knee arthroplasty without evidence of hardware failure. Electronically Signed   By: Neita Garnet M.D.   On: 03/04/2023 13:59   DG INJECT DIAG/THERA/INC NEEDLE/CATH/PLC EPI/LUMB/SAC W/IMG  Result Date: 02/20/2023 CLINICAL DATA:  Lumbosacral spondylosis without myelopathy. Bilateral low back pain and leg numbness. No prior injections or surgery. L5-S1 epidural injection requested. FLUOROSCOPY: Radiation Exposure Index (as provided by the fluoroscopic device): 2.5 mGy Kerma PROCEDURE: The procedure, risks, benefits, and alternatives were explained to the patient. Questions regarding the procedure were encouraged and answered. The patient understands and consents to the procedure. LUMBAR EPIDURAL INJECTION: An interlaminar approach was performed on the left at L5-S1. The overlying skin was cleansed and anesthetized. A 3.5 inch 20 gauge epidural needle was advanced using loss-of-resistance technique. DIAGNOSTIC EPIDURAL INJECTION: Injection of Isovue-M 200 shows a good epidural pattern with spread above and below the level of needle placement, primarily on the left. No vascular opacification is seen. THERAPEUTIC EPIDURAL INJECTION: 80 mg of Depo-Medrol mixed with 3 mL of 1% lidocaine were instilled. The procedure was well-tolerated, and the patient was discharged thirty minutes following the injection in good condition. COMPLICATIONS: None  immediate. IMPRESSION: Technically successful interlaminar epidural injection on the left at L5-S1. Electronically Signed   By: Obie Dredge M.D.   On: 02/20/2023 14:29    Disposition: Discharge disposition: 01-Home or Self Care          Follow-up Information     Cristie Hem, PA-C. Schedule an appointment as soon as possible for a visit in 2 week(s).   Specialty: Orthopedic Surgery Contact information: [address] [place] [address] [phone]                  Signed: Cristie Hem 03/05/2023, 11:38 AM

## 2023-03-05 NOTE — Progress Notes (Addendum)
Subjective: 1 Day Post-Op Procedure(s) (LRB): TOTAL KNEE ARTHROPLASTY (Left) Patient reports pain as mild.  Nauseous yesterday.  Much better today.  Patient ready to go home today.  Objective: Vital signs in last 24 hours: Temp:  [97.1 F (36.2 C)-98.3 F (36.8 C)] 98.3 F (36.8 C) (10/29 0950) Pulse Rate:  [50-72] 70 (10/29 0950) Resp:  [10-18] 15 (10/29 0950) BP: (96-129)/(41-57) 102/50 (10/29 0950) SpO2:  [95 %-100 %] 95 % (10/29 0950)  Intake/Output from previous day: 10/28 0701 - 10/29 0700 In: 900 [I.V.:800; IV Piggyback:100] Out: 925 [Urine:925] Intake/Output this shift: No intake/output data recorded.  No results for input(s): "HGB" in the last 72 hours. No results for input(s): "WBC", "RBC", "HCT", "PLT" in the last 72 hours. No results for input(s): "NA", "K", "CL", "CO2", "BUN", "CREATININE", "GLUCOSE", "CALCIUM" in the last 72 hours. No results for input(s): "LABPT", "INR" in the last 72 hours.  Neurologically intact Neurovascular intact Sensation intact distally Intact pulses distally Dorsiflexion/Plantar flexion intact Incision: dressing C/D/I No cellulitis present Compartment soft   Assessment/Plan: 1 Day Post-Op Procedure(s) (LRB): TOTAL KNEE ARTHROPLASTY (Left) Advance diet Up with therapy Discharge home with home health once cleared by PT and BP stable.  Patient very motivated to go home today WBAT LLE- Hypotension- ordered a bolus.  push po fluids.  Hold of on antihypertensive medication       Cristie Hem 03/05/2023, 11:36 AM

## 2023-03-07 ENCOUNTER — Telehealth: Payer: Self-pay | Admitting: Orthopaedic Surgery

## 2023-03-07 ENCOUNTER — Ambulatory Visit (HOSPITAL_BASED_OUTPATIENT_CLINIC_OR_DEPARTMENT_OTHER): Payer: Medicare Other | Admitting: Physical Therapy

## 2023-03-07 NOTE — Telephone Encounter (Signed)
Called and gave verbal 

## 2023-03-07 NOTE — Telephone Encounter (Signed)
Besty from [facility] called and needs verbal orders. PT 2 wk 3. CB#[phone]

## 2023-03-11 ENCOUNTER — Telehealth: Payer: Self-pay | Admitting: Orthopaedic Surgery

## 2023-03-11 NOTE — Telephone Encounter (Signed)
Diane Giles from [facility] called to say patient is having 7 out of 10 pain. She just took the Oxycodone and it only lighten up just a little bit. Just notifying you. CB#[phone]

## 2023-03-12 ENCOUNTER — Other Ambulatory Visit: Payer: Self-pay | Admitting: Physician Assistant

## 2023-03-12 ENCOUNTER — Telehealth: Payer: Self-pay

## 2023-03-12 MED ORDER — OXYCODONE-ACETAMINOPHEN 5-325 MG PO TABS: 1.0000 | ORAL_TABLET | Freq: Three times a day (TID) | ORAL | 0 refills | Status: DC | PRN

## 2023-03-12 NOTE — Telephone Encounter (Signed)
Notified patient and her husband as well.

## 2023-03-12 NOTE — Telephone Encounter (Signed)
Sent and decreased frequency

## 2023-03-12 NOTE — Telephone Encounter (Signed)
Ok, thanks.

## 2023-03-12 NOTE — Telephone Encounter (Signed)
Patient's husband called concerning Rx refill for Oxycodone.  Would like a CB.  Cb# [phone].  Please advise.  Thank you.

## 2023-03-19 ENCOUNTER — Encounter: Payer: Medicare Other | Admitting: Physician Assistant

## 2023-03-19 ENCOUNTER — Encounter (HOSPITAL_BASED_OUTPATIENT_CLINIC_OR_DEPARTMENT_OTHER): Payer: Self-pay

## 2023-03-19 ENCOUNTER — Other Ambulatory Visit: Payer: Self-pay

## 2023-03-19 ENCOUNTER — Emergency Department (HOSPITAL_BASED_OUTPATIENT_CLINIC_OR_DEPARTMENT_OTHER)
Admission: EM | Admit: 2023-03-19 | Discharge: 2023-03-19 | Disposition: A | Discharge status: Home / Self Care | Payer: Medicare Other | Attending: Emergency Medicine | Admitting: Emergency Medicine

## 2023-03-19 ENCOUNTER — Emergency Department (HOSPITAL_BASED_OUTPATIENT_CLINIC_OR_DEPARTMENT_OTHER): Payer: Medicare Other

## 2023-03-19 ENCOUNTER — Telehealth: Payer: Self-pay

## 2023-03-19 DIAGNOSIS — Z794 Long term (current) use of insulin: Secondary | ICD-10-CM | POA: Insufficient documentation

## 2023-03-19 DIAGNOSIS — Z7982 Long term (current) use of aspirin: Secondary | ICD-10-CM | POA: Diagnosis not present

## 2023-03-19 DIAGNOSIS — K5641 Fecal impaction: Secondary | ICD-10-CM | POA: Diagnosis not present

## 2023-03-19 DIAGNOSIS — E119 Type 2 diabetes mellitus without complications: Secondary | ICD-10-CM | POA: Insufficient documentation

## 2023-03-19 DIAGNOSIS — R109 Unspecified abdominal pain: Secondary | ICD-10-CM | POA: Diagnosis present

## 2023-03-19 DIAGNOSIS — K5903 Drug induced constipation: Secondary | ICD-10-CM

## 2023-03-19 LAB — COMPREHENSIVE METABOLIC PANEL
ALT: 9 U/L (ref 0–44)
AST: 17 U/L (ref 15–41)
Albumin: 3.3 g/dL — ABNORMAL LOW (ref 3.5–5.0)
Alkaline Phosphatase: 135 U/L — ABNORMAL HIGH (ref 38–126)
Anion gap: 11 (ref 5–15)
BUN: 22 mg/dL (ref 8–23)
CO2: 25 mmol/L (ref 22–32)
Calcium: 9.5 mg/dL (ref 8.9–10.3)
Chloride: 103 mmol/L (ref 98–111)
Creatinine, Ser: 1.18 mg/dL — ABNORMAL HIGH (ref 0.44–1.00)
GFR, Estimated: 50 mL/min — ABNORMAL LOW (ref >60)
Glucose, Bld: 109 mg/dL — ABNORMAL HIGH (ref 70–99)
Potassium: 3.7 mmol/L (ref 3.5–5.1)
Sodium: 139 mmol/L (ref 135–145)
Total Bilirubin: 0.5 mg/dL (ref <1.2)
Total Protein: 6 g/dL — ABNORMAL LOW (ref 6.5–8.1)

## 2023-03-19 LAB — CBC
HCT: 30.7 % — ABNORMAL LOW (ref 36.0–46.0)
Hemoglobin: 10.1 g/dL — ABNORMAL LOW (ref 12.0–15.0)
MCH: 30.6 pg (ref 26.0–34.0)
MCHC: 32.9 g/dL (ref 30.0–36.0)
MCV: 93 fL (ref 80.0–100.0)
Platelets: 335 10*3/uL (ref 150–400)
RBC: 3.3 MIL/uL — ABNORMAL LOW (ref 3.87–5.11)
RDW: 14.8 % (ref 11.5–15.5)
WBC: 10 10*3/uL (ref 4.0–10.5)
nRBC: 0 % (ref 0.0–0.2)

## 2023-03-19 LAB — LIPASE, BLOOD: Lipase: 24 U/L (ref 11–51)

## 2023-03-19 MED ORDER — IOHEXOL 300 MG/ML  SOLN
100.0000 mL | Freq: Once | INTRAMUSCULAR | Status: AC | PRN
  Administered 2023-03-19: 85 mL via INTRAVENOUS

## 2023-03-19 MED ORDER — SENNOSIDES-DOCUSATE SODIUM 8.6-50 MG PO TABS: 1.0000 | ORAL_TABLET | Freq: Every day | ORAL | 0 refills | Status: DC

## 2023-03-19 MED ORDER — MORPHINE SULFATE (PF) 4 MG/ML IV SOLN
4.0000 mg | Freq: Once | INTRAVENOUS | Status: AC
  Administered 2023-03-19: 4 mg via INTRAVENOUS
  Filled 2023-03-19: qty 1

## 2023-03-19 MED ORDER — FLEET ENEMA RE ENEM
1.0000 | ENEMA | Freq: Once | RECTAL | Status: AC
  Administered 2023-03-19: 1 via RECTAL
  Filled 2023-03-19: qty 1

## 2023-03-19 MED ORDER — BISACODYL 5 MG PO TBEC
10.0000 mg | DELAYED_RELEASE_TABLET | Freq: Once | ORAL | Status: AC
  Administered 2023-03-19: 10 mg via ORAL
  Filled 2023-03-19: qty 2

## 2023-03-19 NOTE — Telephone Encounter (Signed)
Spoke to patient.  Patient is constipated.  Recommended mag citrate and told her you would call to reschedule

## 2023-03-19 NOTE — ED Provider Notes (Cosign Needed Addendum)
[place] EMERGENCY DEPARTMENT AT [facility] Provider Note   CSN: [id number] Arrival date & time: 03/19/23  1231     History  Chief Complaint  Patient presents with   Constipation    Diane Giles is a 67 y.o. female with past medical history significant for emphysema, obesity, hyperlipidemia, diabetes, peripheral artery disease, previous small bowel obstructions, with recent knee replacement 2 weeks ago.  Patient reports that her knee has been healing well, she has been taking pain medications, has tried stool softeners, laxatives, with glycerin suppository, reports that she is only had very small hard bowel movements, has had some severe upper abdominal pain that felt similar to her previous bowel obstructions, she reports that she had severe pain earlier today and then did have a small passage of gas.  She did take Zofran and oxycodone prior to arrival.  No previous history of obstruction requiring disimpaction.   Constipation      Home Medications Prior to Admission medications   Medication Sig Start Date End Date Taking? Authorizing Provider  senna-docusate (SENOKOT-S) 8.6-50 MG tablet Take 1 tablet by mouth daily. 03/19/23  Yes Everest Brod H, PA-C  allopurinol (ZYLOPRIM) 100 MG tablet Take 100 mg by mouth in the morning.    [provider]  aspirin EC 81 MG tablet Take 1 tablet (81 mg total) by mouth 2 (two) times daily. To be taken after surgery 02/25/23 02/25/24  Cristie Hem, PA-C  atenolol (TENORMIN) 50 MG tablet Take 25 mg by mouth in the morning. 10/13/21   [provider]  atorvastatin (LIPITOR) 80 MG tablet Take 1 tablet (80 mg total) by mouth daily at 6 PM. 08/16/17   Sherren Mocha, MD  buPROPion (WELLBUTRIN XL) 150 MG 24 hr tablet Take 150 mg by mouth every morning. 04/25/22   [provider]  CALCIUM 600 1500 (600 Ca) MG TABS tablet Take 1 tablet by mouth daily. 02/18/23   [provider]  Cholecalciferol  (VITAMIN D3) 250 MCG (10000 UT) capsule Take 10,000 Units by mouth daily.    [provider]  clobetasol (TEMOVATE) 0.05 % GEL Apply 1 Application topically daily.    [provider]  ezetimibe (ZETIA) 10 MG tablet Take 1 tablet (10 mg total) by mouth daily. 01/18/23 01/13/24  Yates Decamp, MD  Lancets MISC Use as directed once a day. (DX: R73.9) 01/20/15   Sherren Mocha, MD  losartan (COZAAR) 50 MG tablet Take 50 mg by mouth daily.    [provider]  magnesium oxide (MAG-OX) 400 (240 Mg) MG tablet Take 400 mg by mouth daily.    [provider]  methocarbamol (ROBAXIN-750) 750 MG tablet Take 1 tablet (750 mg total) by mouth 2 (two) times daily as needed for muscle spasms. 02/25/23   Cristie Hem, PA-C  ondansetron (ZOFRAN) 4 MG tablet Take 1 tablet (4 mg total) by mouth daily as needed for nausea or vomiting. Patient not taking: Reported on 03/05/2023 02/25/23 02/25/24  Cristie Hem, PA-C  oxyCODONE-acetaminophen (PERCOCET) 5-325 MG tablet Take 1-2 tablets by mouth every 8 (eight) hours as needed. 03/12/23   Cristie Hem, PA-C  OZEMPIC, 0.25 OR 0.5 MG/DOSE, 2 MG/3ML SOPN Inject 0.5 mg into the skin every Sunday. 05/16/22   [provider]  pregabalin (LYRICA) 75 MG capsule Take 75 mg by mouth 2 (two) times daily. 01/01/20   [provider]      Allergies    Codeine  Review of Systems   Review of Systems  Gastrointestinal:  Positive for constipation.  All other systems reviewed and are negative.   Physical Exam Updated Vital Signs BP 116/60 (BP Location: Right Arm)   Pulse 82   Temp 97.9 F (36.6 C) (Oral)   Resp 16   Ht 4\' 11"  (1.499 m)   Wt 58.5 kg   LMP 05/07/2005 (Approximate)   SpO2 94%   BMI 26.05 kg/m  Physical Exam Vitals and nursing note reviewed.  Constitutional:      General: She is not in acute distress.    Appearance: Normal appearance.  HENT:     Head: Normocephalic and atraumatic.  Eyes:     General:         Right eye: No discharge.        Left eye: No discharge.  Cardiovascular:     Rate and Rhythm: Normal rate and regular rhythm.     Heart sounds: No murmur heard.    No friction rub. No gallop.  Pulmonary:     Effort: Pulmonary effort is normal.     Breath sounds: Normal breath sounds.  Abdominal:     General: Bowel sounds are normal.     Palpations: Abdomen is soft.  Genitourinary:    Comments: Small nonthrombosed external hemorrhoid noted, good external rectal tone, patient does have a hard stool ball palpated around 2 cm into the rectum, significant pain with rectal exam. Skin:    General: Skin is warm and dry.     Capillary Refill: Capillary refill takes less than 2 seconds.  Neurological:     Mental Status: She is alert and oriented to person, place, and time.  Psychiatric:        Mood and Affect: Mood normal.        Behavior: Behavior normal.     ED Results / Procedures / Treatments   Labs (all labs ordered are listed, but only abnormal results are displayed) Labs Reviewed  COMPREHENSIVE METABOLIC PANEL - Abnormal; Notable for the following components:      Result Value   Glucose, Bld 109 (*)    Creatinine, Ser 1.18 (*)    Total Protein 6.0 (*)    Albumin 3.3 (*)    Alkaline Phosphatase 135 (*)    GFR, Estimated 50 (*)    All other components within normal limits  CBC - Abnormal; Notable for the following components:   RBC 3.30 (*)    Hemoglobin 10.1 (*)    HCT 30.7 (*)    All other components within normal limits  LIPASE, BLOOD    EKG None  Radiology CT ABDOMEN PELVIS W CONTRAST  Result Date: 03/19/2023 CLINICAL DATA:  Constipation and abdominal pain. EXAM: CT ABDOMEN AND PELVIS WITH CONTRAST TECHNIQUE: Multidetector CT imaging of the abdomen and pelvis was performed using the standard protocol following bolus administration of intravenous contrast. RADIATION DOSE REDUCTION: This exam was performed according to the departmental dose-optimization program  which includes automated exposure control, adjustment of the mA and/or kV according to patient size and/or use of iterative reconstruction technique. CONTRAST:  85mL OMNIPAQUE IOHEXOL 300 MG/ML  SOLN COMPARISON:  November 20, 2021 FINDINGS: Lower chest: No acute abnormality. Hepatobiliary: A stable 6 mm focus of parenchymal low attenuation is seen within the posterior aspect of the right lobe of the liver (axial CT image 16, CT series 2). The gallbladder is moderately distended without evidence of gallstones, gallbladder wall thickening, or biliary dilatation. Pancreas: Unremarkable. No pancreatic  ductal dilatation or surrounding inflammatory changes. Spleen: Normal in size without focal abnormality. Adrenals/Urinary Tract: Adrenal glands are unremarkable. Kidneys are normal, without renal calculi, focal lesion, or hydronephrosis. Bladder is unremarkable. Stomach/Bowel: Stomach is within normal limits. Appendix appears normal. A large amount of stool is seen throughout a redundant sigmoid colon with fluid and air noted throughout the remainder of the large bowel. No evidence of bowel wall thickening, distention, or inflammatory changes. Vascular/Lymphatic: Aortic atherosclerosis. A patent stent is seen within the right common iliac artery. No enlarged abdominal or pelvic lymph nodes. Reproductive: Uterus and bilateral adnexa are unremarkable. Other: No abdominal wall hernia or abnormality. No abdominopelvic ascites. Musculoskeletal: A total right hip replacement is seen with associated streak artifact and subsequently limited evaluation of the adjacent osseous and soft tissue structures. No acute osseous abnormalities are identified. IMPRESSION: 1. Large amount of stool throughout a redundant sigmoid colon. 2. Stable 6 mm focus of parenchymal low attenuation within the posterior aspect of the right lobe of the liver, likely representing a small hepatic cyst. Correlation with nonemergent hepatic ultrasound is  recommended. 3. Patent right common iliac artery stent. 4. Total right hip replacement. 5. Aortic atherosclerosis. Aortic Atherosclerosis (ICD10-I70.0). Electronically Signed   By: Aram Candela M.D.   On: 03/19/2023 19:36    Procedures Fecal disimpaction  Date/Time: 03/19/2023 9:15 PM  Performed by: Olene Floss, PA-C Authorized by: Olene Floss, PA-C  Consent: Verbal consent obtained. Risks and benefits: risks, benefits and alternatives were discussed Consent given by: patient Patient understanding: patient states understanding of the procedure being performed Patient consent: the patient's understanding of the procedure matches consent given Patient identity confirmed: verbally with patient Local anesthesia used: no  Anesthesia: Local anesthesia used: no  Sedation: Patient sedated: no  Patient tolerance: patient tolerated the procedure well with no immediate complications       Medications Ordered in ED Medications  morphine (PF) 4 MG/ML injection 4 mg (4 mg Intravenous Given 03/19/23 1456)  bisacodyl (DULCOLAX) EC tablet 10 mg (10 mg Oral Given 03/19/23 1457)  iohexol (OMNIPAQUE) 300 MG/ML solution 100 mL (85 mLs Intravenous Contrast Given 03/19/23 1652)  morphine (PF) 4 MG/ML injection 4 mg (4 mg Intravenous Given 03/19/23 1944)  sodium phosphate (FLEET) enema 1 enema (1 enema Rectal Given 03/19/23 2113)    ED Course/ Medical Decision Making/ A&P Clinical Course as of 03/19/23 2116  Tue Mar 19, 2023  2037 Good removal of stool s/p disimpaction, will give enema and then discharge with senna, ongoing home laxatives, and ongoing fluid resuscitation [CP]    Clinical Course User Index [CP] Olene Floss, PA-C                                 Medical Decision Making Amount and/or Complexity of Data Reviewed Labs: ordered. Radiology: ordered.  Risk OTC drugs. Prescription drug management.   This patient is a 67 y.o. female  who  presents to the ED for concern of constipation, abdominal pain.   Differential diagnoses prior to evaluation: The emergent differential diagnosis includes, but is not limited to,  The causes of generalized abdominal pain include but are not limited to AAA, mesenteric ischemia, appendicitis, diverticulitis, DKA, gastritis, gastroenteritis, AMI, nephrolithiasis, pancreatitis, peritonitis, adrenal insufficiency,lead poisoning, iron toxicity, intestinal ischemia, constipation, UTI,SBO/LBO, splenic rupture, biliary disease, IBD, IBS, PUD, or hepatitis --in setting of recent surgery most suspicious for partial or total bowel obstruction, versus  stool impaction. This is not an exhaustive differential.   Past Medical History / Co-morbidities / Social History: emphysema, obesity, hyperlipidemia, diabetes, peripheral artery disease, previous small bowel obstructions, with recent knee replacement 2 weeks ago  Additional history: Chart reviewed. Pertinent results include: Reviewed lab work, imaging from previous emergency room visits, recent admission for knee replacement  Physical Exam: Physical exam performed. The pertinent findings include: Patient with diffuse tenderness throughout the abdomen, with some the left lower quadrant, and epigastric region most focally, overall normal bowel sounds throughout  Lab Tests/Imaging studies: I personally interpreted labs/imaging and the pertinent results include: CBC notable for mild anemia, hemoglobin 10.1, no leukocytosis, normal lipase.  CMP notable for elevated creatinine at 1.18, not significantly changed from baseline, her alkaline phosphatase is also mildly elevated 135, suspect likely secondary to her recent knee surgery.  I independently interpreted CT abdomen pelvis with contrast which shows large amount of constipation, no bowel obstruction. I agree with the radiologist interpretation.  Medications: I ordered medication including morphine for pain, Dulcolax  for constipation, Fleet enema after disimpaction.  I have reviewed the patients home medicines and have made adjustments as needed.   I was able to disimpact a fair amount of stool, but patient continued to pass more stool into the rectum during disimpaction, discussed with patient that I am unlikely to give her complete relief of her symptoms tonight, but hopefully the disimpaction will help to ease the passage of the rest of the stool.  Will discharge with plan for continued laxative use, as well as senna, increase fluid intake, and close follow-up  Disposition: After consideration of the diagnostic results and the patients response to treatment, I feel that patient is stable for discharge with plan as above.   emergency department workup does not suggest an emergent condition requiring admission or immediate intervention beyond what has been performed at this time. The plan is: as above. The patient is safe for discharge and has been instructed to return immediately for worsening symptoms, change in symptoms or any other concerns.  Final Clinical Impression(s) / ED Diagnoses Final diagnoses:  Fecal impaction (HCC)  Drug-induced constipation    Rx / DC Orders ED Discharge Orders          Ordered    senna-docusate (SENOKOT-S) 8.6-50 MG tablet  Daily        03/19/23 2056              Krimson Massmann, Reynolds H, PA-C 03/19/23 2101    [place] 03/19/23 2116    Derwood Kaplan, MD 03/20/23 [phone]

## 2023-03-19 NOTE — Telephone Encounter (Signed)
Spoke with patient's husband. She will call us once she gets home from the hospital and we will work her in.

## 2023-03-19 NOTE — ED Triage Notes (Addendum)
In for eval of constipation and abd pain. S/p  knee surgery 2 weeks ago. Reports heeling well. Has been taking pain medications. Tried stool softeners, laxatives, and glycerine suppository. Small, hard "rocks" for Bms, last time is am. Took Zofran and Oxycodone prior to leaving her house on the ambulance.

## 2023-03-19 NOTE — Discharge Instructions (Signed)
You should continue using your home laxatives, you can even increase your laxative regimen, prior to colonoscopy they will ask you to bowel prep by drinking half bottle of MiraLAX mixed with Gatorade, and this is effective at encouraging you to pass a large amount of stool, additionally have prescribed a medication to help with the constipation, I recommend that you drink plenty of fluids at least 50 to 64 ounces of water per day.  Please return if you have significantly worsening pain or inability to pass stool despite treatment as above.

## 2023-03-19 NOTE — Telephone Encounter (Signed)
Pt husband called the triage phone stating patient was having medical issues and wasn't going to make it to the appt today He did not explain what/why?

## 2023-03-20 ENCOUNTER — Ambulatory Visit (HOSPITAL_BASED_OUTPATIENT_CLINIC_OR_DEPARTMENT_OTHER): Payer: Medicare Other | Admitting: Physical Therapy

## 2023-03-20 ENCOUNTER — Encounter (HOSPITAL_BASED_OUTPATIENT_CLINIC_OR_DEPARTMENT_OTHER): Payer: Self-pay

## 2023-03-20 ENCOUNTER — Telehealth: Payer: Self-pay | Admitting: Orthopaedic Surgery

## 2023-03-20 NOTE — Telephone Encounter (Signed)
Patient husband called and said that she went to the hospital and you said that you would work her in the schedule. CB#[phone]

## 2023-03-20 NOTE — Telephone Encounter (Signed)
Called and scheduled patient

## 2023-03-21 ENCOUNTER — Ambulatory Visit: Payer: Medicare Other | Admitting: Orthopaedic Surgery

## 2023-03-21 ENCOUNTER — Encounter: Payer: Self-pay | Admitting: Orthopaedic Surgery

## 2023-03-21 DIAGNOSIS — Z96652 Presence of left artificial knee joint: Secondary | ICD-10-CM

## 2023-03-21 MED ORDER — TRAMADOL HCL 50 MG PO TABS
50.0000 mg | ORAL_TABLET | Freq: Two times a day (BID) | ORAL | 0 refills | Status: DC | PRN
Start: 2023-03-21 — End: 2023-07-13

## 2023-03-21 NOTE — Progress Notes (Signed)
Post-Op Visit Note   Patient: Diane Giles           Date of Birth: [birth year]           MRN: [id number] Visit Date: 03/21/2023 PCP: Lula Olszewski, MD   Assessment & Plan:  Chief Complaint:  Chief Complaint  Patient presents with   Left Knee - Routine Post Op   Visit Diagnoses:  1. Status post total left knee replacement     Plan: Patient is a 67 year old female who is 2 weeks status post left total knee replacement.  She did have to to go to the ED for fecal impaction and constipation.  She is feeling much better now.  She has been on stool softeners and laxatives.  Exam of the left knee shows a healed surgical incision.  Her range of motion is excellent.  She is basically at 0 to 115 degrees.  Collaterals are stable.  No signs of infection.  Sutures removed Steri-Strips applied.  Continue with outpatient PT.  Recheck in 4 weeks with repeat x-rays of the left knee.  Follow-Up Instructions: Return in about 4 weeks (around 04/18/2023) for With [facility].   Orders:  Orders Placed This Encounter  Procedures   Ambulatory referral to Physical Therapy   Meds ordered this encounter  Medications   traMADol (ULTRAM) 50 MG tablet    Sig: Take 1-2 tablets (50-100 mg total) by mouth 2 (two) times daily as needed.    Dispense:  30 tablet    Refill:  0    Imaging: No results found.  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   Aortoiliac occlusive disease (HCC) 12/03/2016   Centrilobular emphysema (HCC) 10/03/2016   Right groin pain 02/27/2016   Sacroiliitis (HCC) 02/27/2016    Postmenopausal bleeding 12/01/2015   Psoas tendinitis of right side 10/21/2015   Endometrial thickening on ultrasound 07/28/2015   S/P right THA, 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   Type 2 diabetes mellitus (HCC) 05/18/2014   DDD (degenerative disc disease) 03/18/2013   Metabolic syndrome 07/29/2012   Essential hypertension, benign 07/18/2012   Hyperlipidemia 07/18/2012   Obstructive sleep apnea 07/04/2012   Osteoporosis 07/04/2012   Morbid obesity (HCC) 07/04/2012   Past Medical History:  Diagnosis Date   Aortic stenosis    mild-moderate AS 04/22/19 echo   Aortoiliac occlusive disease (HCC) 12/03/2016   Arthritis    Diabetes mellitus without complication (HCC)    pt states she has been told she is no longer diabetic and is not taking meds   Diverticulitis    Gout    Heart murmur    Hyperlipidemia    Hypertension    Osteoporosis    Sleep apnea    Tobacco use disorder 07/04/2012    Family History  Problem Relation Age of Onset   Migraines Mother    Thyroid disease Mother        hypothyroid   Cancer Father 75       Dec age 78 with pancreatic Ca    Past Surgical History:  Procedure Laterality Date   ABDOMINAL AORTOGRAM W/LOWER EXTREMITY N/A 12/03/2016  Procedure: Abdominal Aortogram w/Lower Extremity;  Surgeon: Chuck Hint, MD;  [facility] INVASIVE CV LAB;  Service: Cardiovascular;  Laterality: N/A;   AORTA -INNOMIATE BYPASS N/A 05/18/2019   Procedure: AORTA -INNOMIATE BYPASS Using Hemashield Gold Graft Size 8mm;  Surgeon: Alleen Borne, MD;  Location: [facility] OR;  Service: Open Heart Surgery;  Laterality: N/A;   aorto- fem bypass Bilateral    aortobiiliac bypass in 2000   DILATATION & CURETTAGE/HYSTEROSCOPY WITH MYOSURE N/A 12/27/2015   Procedure: DILATATION & CURETTAGE/HYSTEROSCOPY;  Surgeon: Patton Salles, MD;  Location: [facility] ORS;  Service: Gynecology;  Laterality: N/A;   DILATION AND CURETTAGE OF UTERUS  11/2015   ELBOW SURGERY Left ~2007   tendon repair by Dr.  Ranell Patrick   EYE SURGERY Bilateral 2008   lasik   JOINT REPLACEMENT     lower aortic bypass  2000   per patient   PERIPHERAL VASCULAR INTERVENTION  12/03/2016   Stenting of anastomotic stenosis of the aortoiliac and right common iliac artery with 7 x 39 mm VBX covered stent and postdilated with 9 mm balloon.   Right innonimate bypass  05/18/2019   Aorto innominate bypass surgery with median sternotomy   STERNOTOMY  05/18/2019   Right aorto-innonimate bypass surgery 05/18/19 Dr. Laneta Simmers   TOTAL HIP ARTHROPLASTY Right 07/27/2014   Procedure: RIGHT TOTAL HIP ARTHROPLASTY ANTERIOR APPROACH;  Surgeon: Durene Romans, MD;  Location: [facility] ORS;  Service: Orthopedics;  Laterality: Right;   TOTAL KNEE ARTHROPLASTY Right 05/28/2022   Procedure: RIGHT TOTAL KNEE ARTHROPLASTY;  Surgeon: Tarry Kos, MD;  Location: [facility] OR;  Service: Orthopedics;  Laterality: Right;   TOTAL KNEE ARTHROPLASTY Left 03/04/2023   Procedure: TOTAL KNEE ARTHROPLASTY;  Surgeon: Tarry Kos, MD;  Location: [facility] OR;  Service: Orthopedics;  Laterality: Left;   Social History   Occupational History   Occupation: Tree surgeon  Tobacco Use   Smoking status: Former    Current packs/day: 0.00    Average packs/day: 0.5 packs/day for 40.0 years (20.0 ttl pk-yrs)    Types: Cigarettes    Start date: 02/23/1977    Quit date: 02/23/2017    Years since quitting: 6.0   Smokeless tobacco: Never  Vaping Use   Vaping status: Never Used  Substance and Sexual Activity   Alcohol use: Yes    Comment: very rare--maybe once a year   Drug use: No   Sexual activity: Yes    Partners: Male    Birth control/protection: Post-menopausal

## 2023-03-25 ENCOUNTER — Encounter (HOSPITAL_BASED_OUTPATIENT_CLINIC_OR_DEPARTMENT_OTHER): Payer: Self-pay | Admitting: Physical Therapy

## 2023-03-25 ENCOUNTER — Other Ambulatory Visit: Payer: Self-pay

## 2023-03-25 ENCOUNTER — Ambulatory Visit (HOSPITAL_BASED_OUTPATIENT_CLINIC_OR_DEPARTMENT_OTHER): Payer: Medicare Other | Attending: Orthopaedic Surgery | Admitting: Physical Therapy

## 2023-03-25 DIAGNOSIS — M25562 Pain in left knee: Secondary | ICD-10-CM | POA: Insufficient documentation

## 2023-03-25 DIAGNOSIS — R262 Difficulty in walking, not elsewhere classified: Secondary | ICD-10-CM | POA: Insufficient documentation

## 2023-03-25 DIAGNOSIS — R600 Localized edema: Secondary | ICD-10-CM | POA: Diagnosis not present

## 2023-03-25 DIAGNOSIS — M6281 Muscle weakness (generalized): Secondary | ICD-10-CM | POA: Insufficient documentation

## 2023-03-25 DIAGNOSIS — Z96652 Presence of left artificial knee joint: Secondary | ICD-10-CM | POA: Insufficient documentation

## 2023-03-25 NOTE — Therapy (Signed)
OUTPATIENT PHYSICAL THERAPY LOWER EXTREMITY EVALUATION   Patient Name: Diane Giles MRN: [id number] DOB:22-Feb-1956, 67 y.o., female Today's Date: 03/25/2023  END OF SESSION:  PT End of Session - 03/25/23 1234     Visit Number 1    Number of Visits 18    Date for PT Re-Evaluation 06/23/23    Authorization Type MCR    PT Start Time 1145    PT Stop Time 1218    PT Time Calculation (min) 33 min    Activity Tolerance Patient tolerated treatment well    Behavior During Therapy [facility] for tasks assessed/performed             Past Medical History:  Diagnosis Date   Aortic stenosis    mild-moderate AS 04/22/19 echo   Aortoiliac occlusive disease (HCC) 12/03/2016   Arthritis    Diabetes mellitus without complication (HCC)    pt states she has been told she is no longer diabetic and is not taking meds   Diverticulitis    Gout    Heart murmur    Hyperlipidemia    Hypertension    Osteoporosis    Sleep apnea    Tobacco use disorder 07/04/2012   Past Surgical History:  Procedure Laterality Date   ABDOMINAL AORTOGRAM W/LOWER EXTREMITY N/A 12/03/2016   Procedure: Abdominal Aortogram w/Lower Extremity;  Surgeon: Chuck Hint, MD;  Location: [facility] INVASIVE CV LAB;  Service: Cardiovascular;  Laterality: N/A;   AORTA -INNOMIATE BYPASS N/A 05/18/2019   Procedure: AORTA -INNOMIATE BYPASS Using Hemashield Gold Graft Size 8mm;  Surgeon: Alleen Borne, MD;  Location: [facility] OR;  Service: Open Heart Surgery;  Laterality: N/A;   aorto- fem bypass Bilateral    aortobiiliac bypass in 2000   DILATATION & CURETTAGE/HYSTEROSCOPY WITH MYOSURE N/A 12/27/2015   Procedure: DILATATION & CURETTAGE/HYSTEROSCOPY;  Surgeon: Patton Salles, MD;  Location: [facility] ORS;  Service: Gynecology;  Laterality: N/A;   DILATION AND CURETTAGE OF UTERUS  11/2015   ELBOW SURGERY Left ~2007   tendon repair by Dr. Ranell Patrick   EYE SURGERY Bilateral 2008   lasik   JOINT REPLACEMENT     lower aortic bypass   2000   per patient   PERIPHERAL VASCULAR INTERVENTION  12/03/2016   Stenting of anastomotic stenosis of the aortoiliac and right common iliac artery with 7 x 39 mm VBX covered stent and postdilated with 9 mm balloon.   Right innonimate bypass  05/18/2019   Aorto innominate bypass surgery with median sternotomy   STERNOTOMY  05/18/2019   Right aorto-innonimate bypass surgery 05/18/19 Dr. Laneta Simmers   TOTAL HIP ARTHROPLASTY Right 07/27/2014   Procedure: RIGHT TOTAL HIP ARTHROPLASTY ANTERIOR APPROACH;  Surgeon: Durene Romans, MD;  Location: [facility] ORS;  Service: Orthopedics;  Laterality: Right;   TOTAL KNEE ARTHROPLASTY Right 05/28/2022   Procedure: RIGHT TOTAL KNEE ARTHROPLASTY;  Surgeon: Tarry Kos, MD;  Location: [facility] OR;  Service: Orthopedics;  Laterality: Right;   TOTAL KNEE ARTHROPLASTY Left 03/04/2023   Procedure: TOTAL KNEE ARTHROPLASTY;  Surgeon: Tarry Kos, MD;  Location: [facility] OR;  Service: Orthopedics;  Laterality: Left;   Patient Active Problem List   Diagnosis Date Noted   Status post total left knee replacement 03/04/2023   Primary osteoarthritis of left knee 07/10/2022   Status post total right knee replacement 05/28/2022   Vitamin B12 deficiency 10/31/2021   Small bowel obstruction (HCC)    Partial small bowel obstruction (HCC) 10/28/2021   PAD (peripheral artery disease) (HCC)  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   Aortoiliac occlusive disease (HCC) 12/03/2016   Centrilobular emphysema (HCC) 10/03/2016   Right groin pain 02/27/2016   Sacroiliitis (HCC) 02/27/2016   Postmenopausal bleeding 12/01/2015   Psoas tendinitis of right side 10/21/2015   Endometrial thickening on ultrasound 07/28/2015   S/P right THA, 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   Type 2 diabetes mellitus (HCC) 05/18/2014   DDD (degenerative disc disease) 03/18/2013   Metabolic syndrome 07/29/2012   Essential hypertension, benign 07/18/2012   Hyperlipidemia 07/18/2012   Obstructive sleep apnea 07/04/2012    Osteoporosis 07/04/2012   Morbid obesity (HCC) 07/04/2012    PCP: Lula Olszewski MD   REFERRING PROVIDER: Neomia Dear PA   REFERRING DIAG: Right TKA  THERAPY DIAG:  Left knee pain, unspecified chronicity  Difficulty walking  Muscle weakness (generalized)  Localized edema  Rationale for Evaluation and Treatment: Rehabilitation  ONSET DATE: 10/28 L TKA  Days since surgery: 21   SUBJECTIVE:   SUBJECTIVE STATEMENT: Pt states she fell on Thursday 11/14 after seeing MD for L knee TKA. She does not recall a LOB or LOC but she just remembers landing on the L knee on a bathmat. Pt states the swelling does not seem to going away. There is a sharp/stinging in the groin. Direct pressure will cause pain into the groin and HS. Pt reports landing on the knees and fell fwd. She did hit her head and did not have LOC of concussion symptoms. Pt did not skin/cut the the L knee. Denies s/s on infection.    Pt has had 2 wks of HHPT. Pt did very well with HHPT and was immediately able to bend up to 115 of flexion and -2 of ext.   PERTINENT HISTORY: Aortic stenosis; Pre-diabetic,  Gout ( hasn't had an attack in 1.5 years) Osteopetrosis,  R THA,  Left ebow surgery 2007; R TKA PAIN:  Are you having pain? Yes: NPRS scale: 3/10 Pain location: L knee  Pain description: aching  Aggravating factors: movement Relieving factors: rest   PRECAUTIONS: None  WEIGHT BEARING RESTRICTIONS: No  FALLS:  Has patient fallen in last 6 months? No  LIVING ENVIRONMENT: 15  going to the second floor. 2 steps going in.   OCCUPATION:  Retired  Presenter, broadcasting: exercise; [employer] member   PLOF: Independent  PATIENT GOALS:  to get back into her normal routine  NEXT MD VISIT:   OBJECTIVE:   DIAGNOSTIC FINDINGS: 10/28  IMPRESSION: Interval total left knee arthroplasty without evidence of hardware failure.  PATIENT SURVEYS:  FOTO 42 66@ DC 12 pts MCII   COGNITION: Overall cognitive status: Within  functional limits for tasks assessed     SENSATION: WFL  EDEMA:  Circumferential: 42.5 cm vs 38 cm on R   MUSCLE LENGTH: limited adductor length   POSTURE: No Significant postural limitations  PALPATION: TTP of L quad, adductor, and patellar borders  Wound clean and dry with steri strips in place  LOWER EXTREMITY ROM:  Passive ROM L eval R eval  Hip flexion    Hip extension    Hip abduction    Hip adduction    Hip internal rotation    Hip external rotation    Knee flexion 121 127  Knee extension -4 0  Ankle dorsiflexion    Ankle plantarflexion    Ankle inversion    Ankle eversion     (Blank rows = not tested)  LOWER EXTREMITY MMT: no indicated 2/2    FUNCTIONAL TESTS:  UE required  for STS    GAIT: Using single point cane; decreased L stance time, lack of full TKE, mildly antalgic  TODAY'S TREATMENT:                                                                                                                              DATE:    Program Notes Ice 2-3x 10-15 have leg proppedContinue with standing exercises from Home Health  Exercises - Supine Hip Adduction Isometric with Ball  - 2 x daily - 7 x weekly - 2 sets - 10 reps - 5 hold - Supine Bridge  - 2 x daily - 7 x weekly - 2 sets - 10 reps - Lateral Lunge Adductor Stretch with Counter Support  - 2 x daily - 7 x weekly - 1 sets - 3 reps - 30 hold - Seated Long Arc Quad  - 2 x daily - 7 x weekly - 3 sets - 10 reps - Long Sitting Quad Set with Towel Roll Under Heel  - 2 x daily - 7 x weekly - 2 sets - 10 reps - 3 hold   PATIENT EDUCATION:  Education details: exam findings, anatomy, exercise progression, DOMS expectations, muscle firing,  envelope of function, HEP, POC Person educated: Patient Education method: Explanation, Demonstration, Tactile cues, Verbal cues, and Handouts Education comprehension: verbalized understanding, returned demonstration, verbal cues required, tactile cues required, and needs  further education  HOME EXERCISE PROGRAM:  Access Code: [email or web address] URL: [email or web address] Date: 03/25/2023 Prepared by: Zebedee Iba  ASSESSMENT:  CLINICAL IMPRESSION:  Patient is a 67 y.o. female status post L TKA. Pt is 3 wks post op. Pt did sustain a fall post-follow up with MD. However, clinical testing suggests adductor muscle strain at this time. Pt with good ROM and WB tolerance with SPC. Swelling has increased greatly about the L knee so advised on RICE and HEP to improve adductor pain. Plan to re-check at next visit. Pt advised that should knee pain worsen or feelings of instability increase, plan to schedule with MD.  Patient presents with expected limitations in range of motion strength and functional mobility.  Pt would benefit from continued skilled therapy in order to reach goals and maximize functional L LE strength and ROM for full return to PLOF.   OBJECTIVE IMPAIRMENTS: Abnormal gait, decreased activity tolerance, decreased mobility, difficulty walking, decreased ROM, decreased strength, increased edema, and pain.   ACTIVITY LIMITATIONS: carrying, lifting, bending, sitting, standing, squatting, sleeping, stairs, transfers, and locomotion level  PARTICIPATION LIMITATIONS: meal prep, cleaning, laundry, driving, shopping, community activity, occupation, and yard work  PERSONAL FACTORS: 1-2 comorbidities: right hip replacement  are also affecting patient's functional outcome.   REHAB POTENTIAL: Excellent  CLINICAL DECISION MAKING: Stable/uncomplicated  EVALUATION COMPLEXITY: Low   GOALS:   SHORT TERM GOALS: 05/06/2023    Pt will become independent with HEP in order to demonstrate synthesis of PT education.  Baseline: Goal status: INITIAL  2.  Pt  will be able to demonstrate full L knee TKE in order to demonstrate functional improvement in LE function for self-care and house hold duties.  Baseline:  Goal status: INITIAL  3.  Pt will be able to  demonstrate normal walking gait without SPC in clinic distances in order to demonstrate functional improvement in LE function for self-care and house hold duties.  Baseline:  Goal status: INITIAL  4.  Pt will score at least 12 pt increase on FOTO to demonstrate functional improvement in MCII and pt perceived function.   Baseline:  Goal status: INITIAL  LONG TERM GOALS: 06/17/2023   Pt  will become independent with final HEP in order to demonstrate synthesis of PT education.  Baseline:  Goal status: INITIAL  2. Pt will score >/= 66 on FOTO to demonstrate improvement in perceived L LE function.  Baseline:  Goal status: INITIAL  3. Pt will be able to demonstrate full reciprocal stair pattern with single UE in order to demonstrate functional improvement in LE function for community and household mobility.  Baseline:  Goal status: INITIAL  4.  Pt will be able to demonstrate/report ability to walk >30 mins without pain in order to demonstrate functional improvement and tolerance to exercise and community mobility.  Baseline:  Goal status: INITIAL  PLAN:  PT FREQUENCY: 1-2x/week  PT DURATION: 12 weeks  PLANNED INTERVENTIONS: Therapeutic exercises, Therapeutic activity, Neuromuscular re-education, Balance training, Gait training, Patient/Family education, Self Care, Joint mobilization, Stair training, DME instructions, Aquatic Therapy, Cryotherapy, Moist heat, Taping, and Manual therapy, Dry Needling  PLAN FOR NEXT SESSION:  recheck L knee swelling; weighted LAQ, SLS, banded TKE   Zebedee Iba, PT 03/25/2023, 12:51 PM

## 2023-04-01 ENCOUNTER — Ambulatory Visit (HOSPITAL_BASED_OUTPATIENT_CLINIC_OR_DEPARTMENT_OTHER): Payer: Medicare Other | Admitting: Physical Therapy

## 2023-04-01 ENCOUNTER — Encounter (HOSPITAL_BASED_OUTPATIENT_CLINIC_OR_DEPARTMENT_OTHER): Payer: Self-pay | Admitting: Physical Therapy

## 2023-04-01 DIAGNOSIS — M6281 Muscle weakness (generalized): Secondary | ICD-10-CM

## 2023-04-01 DIAGNOSIS — R262 Difficulty in walking, not elsewhere classified: Secondary | ICD-10-CM

## 2023-04-01 DIAGNOSIS — M25562 Pain in left knee: Secondary | ICD-10-CM | POA: Diagnosis not present

## 2023-04-01 NOTE — Therapy (Signed)
OUTPATIENT PHYSICAL THERAPY LOWER EXTREMITY TREATMENT   Patient Name: Diane Giles MRN: [id number] DOB:1955/07/03, 67 y.o., female Today's Date: 04/01/2023  END OF SESSION:  PT End of Session - 04/01/23 1212     Visit Number 2    Number of Visits 18    Date for PT Re-Evaluation 06/23/23    Authorization Type MCR    PT Start Time 1150    PT Stop Time 1208    PT Time Calculation (min) 18 min    Activity Tolerance Patient tolerated treatment well    Behavior During Therapy [facility] for tasks assessed/performed              Past Medical History:  Diagnosis Date   Aortic stenosis    mild-moderate AS 04/22/19 echo   Aortoiliac occlusive disease (HCC) 12/03/2016   Arthritis    Diabetes mellitus without complication (HCC)    pt states she has been told she is no longer diabetic and is not taking meds   Diverticulitis    Gout    Heart murmur    Hyperlipidemia    Hypertension    Osteoporosis    Sleep apnea    Tobacco use disorder 07/04/2012   Past Surgical History:  Procedure Laterality Date   ABDOMINAL AORTOGRAM W/LOWER EXTREMITY N/A 12/03/2016   Procedure: Abdominal Aortogram w/Lower Extremity;  Surgeon: Chuck Hint, MD;  Location: [facility] INVASIVE CV LAB;  Service: Cardiovascular;  Laterality: N/A;   AORTA -INNOMIATE BYPASS N/A 05/18/2019   Procedure: AORTA -INNOMIATE BYPASS Using Hemashield Gold Graft Size 8mm;  Surgeon: Alleen Borne, MD;  Location: [facility] OR;  Service: Open Heart Surgery;  Laterality: N/A;   aorto- fem bypass Bilateral    aortobiiliac bypass in 2000   DILATATION & CURETTAGE/HYSTEROSCOPY WITH MYOSURE N/A 12/27/2015   Procedure: DILATATION & CURETTAGE/HYSTEROSCOPY;  Surgeon: Patton Salles, MD;  Location: [facility] ORS;  Service: Gynecology;  Laterality: N/A;   DILATION AND CURETTAGE OF UTERUS  11/2015   ELBOW SURGERY Left ~2007   tendon repair by Dr. Ranell Patrick   EYE SURGERY Bilateral 2008   lasik   JOINT REPLACEMENT     lower aortic bypass   2000   per patient   PERIPHERAL VASCULAR INTERVENTION  12/03/2016   Stenting of anastomotic stenosis of the aortoiliac and right common iliac artery with 7 x 39 mm VBX covered stent and postdilated with 9 mm balloon.   Right innonimate bypass  05/18/2019   Aorto innominate bypass surgery with median sternotomy   STERNOTOMY  05/18/2019   Right aorto-innonimate bypass surgery 05/18/19 Dr. Laneta Simmers   TOTAL HIP ARTHROPLASTY Right 07/27/2014   Procedure: RIGHT TOTAL HIP ARTHROPLASTY ANTERIOR APPROACH;  Surgeon: Durene Romans, MD;  Location: [facility] ORS;  Service: Orthopedics;  Laterality: Right;   TOTAL KNEE ARTHROPLASTY Right 05/28/2022   Procedure: RIGHT TOTAL KNEE ARTHROPLASTY;  Surgeon: Tarry Kos, MD;  Location: [facility] OR;  Service: Orthopedics;  Laterality: Right;   TOTAL KNEE ARTHROPLASTY Left 03/04/2023   Procedure: TOTAL KNEE ARTHROPLASTY;  Surgeon: Tarry Kos, MD;  Location: [facility] OR;  Service: Orthopedics;  Laterality: Left;   Patient Active Problem List   Diagnosis Date Noted   Status post total left knee replacement 03/04/2023   Primary osteoarthritis of left knee 07/10/2022   Status post total right knee replacement 05/28/2022   Vitamin B12 deficiency 10/31/2021   Small bowel obstruction (HCC)    Partial small bowel obstruction (HCC) 10/28/2021   PAD (peripheral artery disease) (  HCC)    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   Aortoiliac occlusive disease (HCC) 12/03/2016   Centrilobular emphysema (HCC) 10/03/2016   Right groin pain 02/27/2016   Sacroiliitis (HCC) 02/27/2016   Postmenopausal bleeding 12/01/2015   Psoas tendinitis of right side 10/21/2015   Endometrial thickening on ultrasound 07/28/2015   S/P right THA, 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   Type 2 diabetes mellitus (HCC) 05/18/2014   DDD (degenerative disc disease) 03/18/2013   Metabolic syndrome 07/29/2012   Essential hypertension, benign 07/18/2012   Hyperlipidemia 07/18/2012   Obstructive sleep apnea 07/04/2012    Osteoporosis 07/04/2012   Morbid obesity (HCC) 07/04/2012    PCP: Lula Olszewski MD   REFERRING PROVIDER: Neomia Dear PA   REFERRING DIAG: Right TKA  THERAPY DIAG:  Left knee pain, unspecified chronicity  Difficulty walking  Muscle weakness (generalized)  Rationale for Evaluation and Treatment: Rehabilitation  ONSET DATE: 10/28 L TKA  Days since surgery: 28   SUBJECTIVE:   SUBJECTIVE STATEMENT:  Pt states the L knee is very in pain now. Pt states that the pain is much worse at night. Pt no longer has pain into the groin. Pt has not had as much walking as before. Pt has recently had a loss of a friend which has greatly reduced her activity. Pt has much more anterior knee tenderness and more throbbing into the back of the knee.  Ice and elevation does change the amount of swelling into the knee. Ice helps pain but not swelling. Knee feels unstable to walk in.   Eval:  Pt states she fell on Thursday 11/14 after seeing MD for L knee TKA. She does not recall a LOB or LOC but she just remembers landing on the L knee on a bathmat. Pt states the swelling does not seem to going away. There is a sharp/stinging in the groin. Direct pressure will cause pain into the groin and HS. Pt reports landing on the knees and fell fwd. She did hit her head and did not have LOC of concussion symptoms. Pt did not skin/cut the the L knee. Denies s/s on infection.    Pt has had 2 wks of HHPT. Pt did very well with HHPT and was immediately able to bend up to 115 of flexion and -2 of ext.   PERTINENT HISTORY: Aortic stenosis; Pre-diabetic,  Gout ( hasn't had an attack in 1.5 years) Osteopetrosis,  R THA,  Left ebow surgery 2007; R TKA PAIN:  Are you having pain? Yes: NPRS scale: 5/10; will get up to an 8/10 at rest.  Pain location: L knee  Pain description: aching  Aggravating factors: movement Relieving factors: rest   PRECAUTIONS: None  WEIGHT BEARING RESTRICTIONS: No  FALLS:  Has patient  fallen in last 6 months? No  LIVING ENVIRONMENT: 15  going to the second floor. 2 steps going in.   OCCUPATION:  Retired  Presenter, broadcasting: exercise; [employer] member   PLOF: Independent  PATIENT GOALS:  to get back into her normal routine  NEXT MD VISIT:   OBJECTIVE:   DIAGNOSTIC FINDINGS: 10/28  IMPRESSION: Interval total left knee arthroplasty without evidence of hardware failure.  PATIENT SURVEYS:  FOTO 42 66@ DC 12 pts MCII   COGNITION: Overall cognitive status: Within functional limits for tasks assessed     SENSATION: WFL  EDEMA:  Circumferential: 42.5 cm vs 38 cm on R   MUSCLE LENGTH: limited adductor length   POSTURE: No Significant postural limitations  PALPATION: TTP of L quad, adductor,  and patellar borders  Wound clean and dry with steri strips in place  LOWER EXTREMITY ROM:  Passive ROM L eval R eval  Hip flexion    Hip extension    Hip abduction    Hip adduction    Hip internal rotation    Hip external rotation    Knee flexion 121 127  Knee extension -4 0  Ankle dorsiflexion    Ankle plantarflexion    Ankle inversion    Ankle eversion     (Blank rows = not tested)  LOWER EXTREMITY MMT: no indicated 2/2    FUNCTIONAL TESTS:  UE required for STS    GAIT: Using single point cane; decreased L stance time, lack of full TKE, mildly antalgic  TODAY'S TREATMENT:                                                                                                                              DATE:   11/25   Testing of the knee and edu of exam findings: TTP of L tibial plateau, TTP of posterior knee, hypermobility of L patella medial and laterally, mild increase in varus/valgus motion  41 cm suprapatellar edema measurement  Self pain management, appropriate HEP, returning to MD  Eval:   Program Notes Ice 2-3x 10-15 have leg proppedContinue with standing exercises from Home Health  Exercises - Supine Hip Adduction Isometric with Ball   - 2 x daily - 7 x weekly - 2 sets - 10 reps - 5 hold - Supine Bridge  - 2 x daily - 7 x weekly - 2 sets - 10 reps - Lateral Lunge Adductor Stretch with Counter Support  - 2 x daily - 7 x weekly - 1 sets - 3 reps - 30 hold - Seated Long Arc Quad  - 2 x daily - 7 x weekly - 3 sets - 10 reps - Long Sitting Quad Set with Towel Roll Under Heel  - 2 x daily - 7 x weekly - 2 sets - 10 reps - 3 hold   PATIENT EDUCATION:  Education details: exam findings, anatomy, exercise progression, DOMS expectations, muscle firing,  envelope of function, HEP, POC Person educated: Patient Education method: Explanation, Demonstration, Tactile cues, Verbal cues, and Handouts Education comprehension: verbalized understanding, returned demonstration, verbal cues required, tactile cues required, and needs further education  HOME EXERCISE PROGRAM:  Access Code: [email or web address] URL: [email or web address] Date: 03/25/2023 Prepared by: Zebedee Iba  ASSESSMENT:  CLINICAL IMPRESSION:  Pt presents today with worsening pain of the L knee with a decrease in activity and WB activity. Pt presents with more L knee laxity than expected today. Pt advised that PT should be placed on hold until she is able to return to MD for further check of the L knee. HEP discussed and edu about self pain management provided. Pt would benefit from continued skilled therapy in order to reach goals and maximize functional L LE strength and ROM for  full return to PLOF.   OBJECTIVE IMPAIRMENTS: Abnormal gait, decreased activity tolerance, decreased mobility, difficulty walking, decreased ROM, decreased strength, increased edema, and pain.   ACTIVITY LIMITATIONS: carrying, lifting, bending, sitting, standing, squatting, sleeping, stairs, transfers, and locomotion level  PARTICIPATION LIMITATIONS: meal prep, cleaning, laundry, driving, shopping, community activity, occupation, and yard work  PERSONAL FACTORS: 1-2 comorbidities: right hip  replacement  are also affecting patient's functional outcome.   REHAB POTENTIAL: Excellent  CLINICAL DECISION MAKING: Stable/uncomplicated  EVALUATION COMPLEXITY: Low   GOALS:   SHORT TERM GOALS: 05/06/2023    Pt will become independent with HEP in order to demonstrate synthesis of PT education.  Baseline: Goal status: INITIAL  2.  Pt will be able to demonstrate full L knee TKE in order to demonstrate functional improvement in LE function for self-care and house hold duties.  Baseline:  Goal status: INITIAL  3.  Pt will be able to demonstrate normal walking gait without SPC in clinic distances in order to demonstrate functional improvement in LE function for self-care and house hold duties.  Baseline:  Goal status: INITIAL  4.  Pt will score at least 12 pt increase on FOTO to demonstrate functional improvement in MCII and pt perceived function.   Baseline:  Goal status: INITIAL  LONG TERM GOALS: 06/17/2023   Pt  will become independent with final HEP in order to demonstrate synthesis of PT education.  Baseline:  Goal status: INITIAL  2. Pt will score >/= 66 on FOTO to demonstrate improvement in perceived L LE function.  Baseline:  Goal status: INITIAL  3. Pt will be able to demonstrate full reciprocal stair pattern with single UE in order to demonstrate functional improvement in LE function for community and household mobility.  Baseline:  Goal status: INITIAL  4.  Pt will be able to demonstrate/report ability to walk >30 mins without pain in order to demonstrate functional improvement and tolerance to exercise and community mobility.  Baseline:  Goal status: INITIAL  PLAN:  PT FREQUENCY: 1-2x/week  PT DURATION: 12 weeks  PLANNED INTERVENTIONS: Therapeutic exercises, Therapeutic activity, Neuromuscular re-education, Balance training, Gait training, Patient/Family education, Self Care, Joint mobilization, Stair training, DME instructions, Aquatic Therapy,  Cryotherapy, Moist heat, Taping, and Manual therapy, Dry Needling  PLAN FOR NEXT SESSION:  recheck L knee swelling; weighted LAQ, SLS, banded TKE   Zebedee Iba, PT 04/01/2023, 12:27 PM

## 2023-04-08 ENCOUNTER — Ambulatory Visit (HOSPITAL_BASED_OUTPATIENT_CLINIC_OR_DEPARTMENT_OTHER): Payer: Medicare Other | Admitting: Physical Therapy

## 2023-04-08 ENCOUNTER — Encounter (HOSPITAL_BASED_OUTPATIENT_CLINIC_OR_DEPARTMENT_OTHER): Payer: Self-pay | Admitting: Physical Therapy

## 2023-04-08 ENCOUNTER — Encounter (HOSPITAL_BASED_OUTPATIENT_CLINIC_OR_DEPARTMENT_OTHER): Payer: Self-pay

## 2023-04-10 ENCOUNTER — Encounter (HOSPITAL_BASED_OUTPATIENT_CLINIC_OR_DEPARTMENT_OTHER): Payer: Self-pay

## 2023-04-10 ENCOUNTER — Ambulatory Visit (HOSPITAL_BASED_OUTPATIENT_CLINIC_OR_DEPARTMENT_OTHER): Payer: Medicare Other | Admitting: Physical Therapy

## 2023-04-12 ENCOUNTER — Ambulatory Visit: Payer: Medicare Other | Admitting: Orthopaedic Surgery

## 2023-04-15 ENCOUNTER — Encounter (HOSPITAL_BASED_OUTPATIENT_CLINIC_OR_DEPARTMENT_OTHER): Payer: Medicare Other | Admitting: Physical Therapy

## 2023-04-15 NOTE — Progress Notes (Unsigned)
Post-Op Visit Note   Patient: Diane Giles           Date of Birth: [birth year]           MRN: [id number] Visit Date: 04/16/2023 PCP: Lula Olszewski, MD   Assessment & Plan:  Chief Complaint: No chief complaint on file.  Visit Diagnoses:  1. Status post total left knee replacement     Plan: ***  Follow-Up Instructions: No follow-ups on file.   Orders:  No orders of the defined types were placed in this encounter.  No orders of the defined types were placed in this encounter.   Imaging: No results found.  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   Aortoiliac occlusive disease (HCC) 12/03/2016   Centrilobular emphysema (HCC) 10/03/2016   Right groin pain 02/27/2016   Sacroiliitis (HCC) 02/27/2016   Postmenopausal bleeding 12/01/2015   Psoas tendinitis of right side 10/21/2015   Endometrial thickening on ultrasound 07/28/2015   S/P right THA, 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   Type 2 diabetes mellitus (HCC) 05/18/2014   DDD (degenerative disc disease) 03/18/2013   Metabolic syndrome 07/29/2012   Essential hypertension, benign 07/18/2012   Hyperlipidemia 07/18/2012   Obstructive sleep apnea 07/04/2012   Osteoporosis 07/04/2012   Morbid obesity (HCC) 07/04/2012   Past Medical History:  Diagnosis Date   Aortic stenosis    mild-moderate AS 04/22/19 echo   Aortoiliac occlusive disease (HCC) 12/03/2016   Arthritis    Diabetes mellitus without complication (HCC)    pt states she has been told she is no longer diabetic and is not taking meds   Diverticulitis    Gout    Heart murmur     Hyperlipidemia    Hypertension    Osteoporosis    Sleep apnea    Tobacco use disorder 07/04/2012    Family History  Problem Relation Age of Onset   Migraines Mother    Thyroid disease Mother        hypothyroid   Cancer Father 3       Dec age 61 with pancreatic Ca    Past Surgical History:  Procedure Laterality Date   ABDOMINAL AORTOGRAM W/LOWER EXTREMITY N/A 12/03/2016   Procedure: Abdominal Aortogram w/Lower Extremity;  Surgeon: Chuck Hint, MD;  Location: [facility] INVASIVE CV LAB;  Service: Cardiovascular;  Laterality: N/A;   AORTA -INNOMIATE BYPASS N/A 05/18/2019   Procedure: AORTA -INNOMIATE BYPASS Using Hemashield Gold Graft Size 8mm;  Surgeon: Alleen Borne, MD;  Location: [facility];  Service: Open Heart Surgery;  Laterality: N/A;   aorto- fem bypass Bilateral    aortobiiliac bypass in 2000   DILATATION & CURETTAGE/HYSTEROSCOPY WITH MYOSURE N/A 12/27/2015   Procedure: DILATATION & CURETTAGE/HYSTEROSCOPY;  Surgeon: Patton Salles, MD;  Location: [facility];  Service: Gynecology;  Laterality: N/A;   DILATION AND CURETTAGE OF UTERUS  11/2015   ELBOW SURGERY Left ~2007   tendon repair by Dr. Ranell Patrick   EYE SURGERY Bilateral 2008   lasik   JOINT REPLACEMENT     lower aortic bypass  2000   per patient  PERIPHERAL VASCULAR INTERVENTION  12/03/2016   Stenting of anastomotic stenosis of the aortoiliac and right common iliac artery with 7 x 39 mm VBX covered stent and postdilated with 9 mm balloon.   Right innonimate bypass  05/18/2019   Aorto innominate bypass surgery with median sternotomy   STERNOTOMY  05/18/2019   Right aorto-innonimate bypass surgery 05/18/19 Dr. Laneta Simmers   TOTAL HIP ARTHROPLASTY Right 07/27/2014   Procedure: RIGHT TOTAL HIP ARTHROPLASTY ANTERIOR APPROACH;  Surgeon: Durene Romans, MD;  Location: [facility];  Service: Orthopedics;  Laterality: Right;   TOTAL KNEE ARTHROPLASTY Right 05/28/2022   Procedure: RIGHT TOTAL KNEE ARTHROPLASTY;  Surgeon: Tarry Kos, MD;  Location: [facility];  Service: Orthopedics;  Laterality: Right;   TOTAL KNEE ARTHROPLASTY Left 03/04/2023   Procedure: TOTAL KNEE ARTHROPLASTY;  Surgeon: Tarry Kos, MD;  Location: [facility];  Service: Orthopedics;  Laterality: Left;   Social History   Occupational History   Occupation: Tree surgeon  Tobacco Use   Smoking status: Former    Current packs/day: 0.00    Average packs/day: 0.5 packs/day for 40.0 years (20.0 ttl pk-yrs)    Types: Cigarettes    Start date: 02/23/1977    Quit date: 02/23/2017    Years since quitting: 6.1   Smokeless tobacco: Never  Vaping Use   Vaping status: Never Used  Substance and Sexual Activity   Alcohol use: Yes    Comment: very rare--maybe once a year   Drug use: No   Sexual activity: Yes    Partners: Male    Birth control/protection: Post-menopausal

## 2023-04-16 ENCOUNTER — Ambulatory Visit (INDEPENDENT_AMBULATORY_CARE_PROVIDER_SITE_OTHER): Payer: Medicare Other | Admitting: Orthopaedic Surgery

## 2023-04-16 ENCOUNTER — Other Ambulatory Visit (INDEPENDENT_AMBULATORY_CARE_PROVIDER_SITE_OTHER): Payer: Medicare Other

## 2023-04-16 DIAGNOSIS — Z96652 Presence of left artificial knee joint: Secondary | ICD-10-CM

## 2023-04-17 ENCOUNTER — Ambulatory Visit (HOSPITAL_BASED_OUTPATIENT_CLINIC_OR_DEPARTMENT_OTHER): Payer: Medicare Other | Attending: Orthopaedic Surgery | Admitting: Physical Therapy

## 2023-04-17 ENCOUNTER — Encounter (HOSPITAL_BASED_OUTPATIENT_CLINIC_OR_DEPARTMENT_OTHER): Payer: Self-pay | Admitting: Physical Therapy

## 2023-04-17 DIAGNOSIS — M6281 Muscle weakness (generalized): Secondary | ICD-10-CM | POA: Diagnosis present

## 2023-04-17 DIAGNOSIS — R262 Difficulty in walking, not elsewhere classified: Secondary | ICD-10-CM | POA: Diagnosis present

## 2023-04-17 DIAGNOSIS — M25562 Pain in left knee: Secondary | ICD-10-CM | POA: Diagnosis present

## 2023-04-17 DIAGNOSIS — R600 Localized edema: Secondary | ICD-10-CM | POA: Insufficient documentation

## 2023-04-17 NOTE — Therapy (Signed)
OUTPATIENT PHYSICAL THERAPY LOWER EXTREMITY TREATMENT   Patient Name: Diane Giles MRN: [id number] DOB:[birth year], 67 y.o., female Today's Date: 04/17/2023  END OF SESSION:  PT End of Session - 04/17/23 1108     Visit Number 3    Number of Visits 18    Date for PT Re-Evaluation 06/23/23    Authorization Type MCR    PT Start Time 1107    PT Stop Time 1145    PT Time Calculation (min) 38 min    Activity Tolerance Patient tolerated treatment well    Behavior During Therapy [facility] for tasks assessed/performed               Past Medical History:  Diagnosis Date   Aortic stenosis    mild-moderate AS 04/22/19 echo   Aortoiliac occlusive disease (HCC) 12/03/2016   Arthritis    Diabetes mellitus without complication (HCC)    pt states she has been told she is no longer diabetic and is not taking meds   Diverticulitis    Gout    Heart murmur    Hyperlipidemia    Hypertension    Osteoporosis    Sleep apnea    Tobacco use disorder 07/04/2012   Past Surgical History:  Procedure Laterality Date   ABDOMINAL AORTOGRAM W/LOWER EXTREMITY N/A 12/03/2016   Procedure: Abdominal Aortogram w/Lower Extremity;  Surgeon: Chuck Hint, MD;  Location: [facility] INVASIVE CV LAB;  Service: Cardiovascular;  Laterality: N/A;   AORTA -INNOMIATE BYPASS N/A 05/18/2019   Procedure: AORTA -INNOMIATE BYPASS Using Hemashield Gold Graft Size 8mm;  Surgeon: Alleen Borne, MD;  Location: [facility];  Service: Open Heart Surgery;  Laterality: N/A;   aorto- fem bypass Bilateral    aortobiiliac bypass in 2000   DILATATION & CURETTAGE/HYSTEROSCOPY WITH MYOSURE N/A 12/27/2015   Procedure: DILATATION & CURETTAGE/HYSTEROSCOPY;  Surgeon: Patton Salles, MD;  Location: [facility];  Service: Gynecology;  Laterality: N/A;   DILATION AND CURETTAGE OF UTERUS  11/2015   ELBOW SURGERY Left ~2007   tendon repair by Dr. Ranell Patrick   EYE SURGERY Bilateral 2008   lasik   JOINT REPLACEMENT     lower aortic bypass   2000   per patient   PERIPHERAL VASCULAR INTERVENTION  12/03/2016   Stenting of anastomotic stenosis of the aortoiliac and right common iliac artery with 7 x 39 mm VBX covered stent and postdilated with 9 mm balloon.   Right innonimate bypass  05/18/2019   Aorto innominate bypass surgery with median sternotomy   STERNOTOMY  05/18/2019   Right aorto-innonimate bypass surgery 05/18/19 Dr. Laneta Simmers   TOTAL HIP ARTHROPLASTY Right 07/27/2014   Procedure: RIGHT TOTAL HIP ARTHROPLASTY ANTERIOR APPROACH;  Surgeon: Durene Romans, MD;  Location: [facility];  Service: Orthopedics;  Laterality: Right;   TOTAL KNEE ARTHROPLASTY Right 05/28/2022   Procedure: RIGHT TOTAL KNEE ARTHROPLASTY;  Surgeon: Tarry Kos, MD;  Location: [facility];  Service: Orthopedics;  Laterality: Right;   TOTAL KNEE ARTHROPLASTY Left 03/04/2023   Procedure: TOTAL KNEE ARTHROPLASTY;  Surgeon: Tarry Kos, MD;  Location: [facility];  Service: Orthopedics;  Laterality: Left;   Patient Active Problem List   Diagnosis Date Noted   Status post total left knee replacement 03/04/2023   Primary osteoarthritis of left knee 07/10/2022   Status post total right knee replacement 05/28/2022   Vitamin B12 deficiency 10/31/2021   Small bowel obstruction (HCC)    Partial small bowel obstruction (HCC) 10/28/2021   PAD (peripheral artery  disease) (HCC)    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   Aortoiliac occlusive disease (HCC) 12/03/2016   Centrilobular emphysema (HCC) 10/03/2016   Right groin pain 02/27/2016   Sacroiliitis (HCC) 02/27/2016   Postmenopausal bleeding 12/01/2015   Psoas tendinitis of right side 10/21/2015   Endometrial thickening on ultrasound 07/28/2015   S/P right THA, 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   Type 2 diabetes mellitus (HCC) 05/18/2014   DDD (degenerative disc disease) 03/18/2013   Metabolic syndrome 07/29/2012   Essential hypertension, benign 07/18/2012   Hyperlipidemia 07/18/2012   Obstructive sleep apnea 07/04/2012    Osteoporosis 07/04/2012   Morbid obesity (HCC) 07/04/2012    PCP: Lula Olszewski MD   REFERRING PROVIDER: Neomia Dear PA   REFERRING DIAG: Right TKA  THERAPY DIAG:  Left knee pain, unspecified chronicity  Difficulty walking  Muscle weakness (generalized)  Rationale for Evaluation and Treatment: Rehabilitation  ONSET DATE: 10/28 L TKA  Days since surgery: 44   SUBJECTIVE:   SUBJECTIVE STATEMENT:  Went back to [facility] following fall, took xrays and all is fine.   Eval:  Pt states she fell on Thursday 11/14 after seeing MD for L knee TKA. She does not recall a LOB or LOC but she just remembers landing on the L knee on a bathmat. Pt states the swelling does not seem to going away. There is a sharp/stinging in the groin. Direct pressure will cause pain into the groin and HS. Pt reports landing on the knees and fell fwd. She did hit her head and did not have LOC of concussion symptoms. Pt did not skin/cut the the L knee. Denies s/s on infection.    Pt has had 2 wks of HHPT. Pt did very well with HHPT and was immediately able to bend up to 115 of flexion and -2 of ext.   PERTINENT HISTORY: Aortic stenosis; Pre-diabetic,  Gout ( hasn't had an attack in 1.5 years) Osteopetrosis,  R THA,  Left ebow surgery 2007; R TKA PAIN:  Are you having pain? Yes: NPRS scale: 0/10 Pain location: L knee  Pain description: aching  Aggravating factors: movement Relieving factors: rest   PRECAUTIONS: None  WEIGHT BEARING RESTRICTIONS: No  FALLS:  Has patient fallen in last 6 months? No  LIVING ENVIRONMENT: 15  going to the second floor. 2 steps going in.   OCCUPATION:  Retired  Presenter, broadcasting: exercise; [employer] member   PLOF: Independent  PATIENT GOALS:  to get back into her normal routine  OBJECTIVE:   DIAGNOSTIC FINDINGS: 10/28  IMPRESSION: Interval total left knee arthroplasty without evidence of hardware failure.  PATIENT SURVEYS:  FOTO 42 66@ DC 12 pts [facility]II    COGNITION: Overall cognitive status: Within functional limits for tasks assessed     SENSATION: WFL  EDEMA:  Circumferential: 42.5 cm vs 38 cm on R   MUSCLE LENGTH: limited adductor length   POSTURE: No Significant postural limitations  PALPATION: TTP of L quad, adductor, and patellar borders  Wound clean and dry with steri strips in place  LOWER EXTREMITY ROM:  Passive ROM L eval R eval  Hip flexion    Hip extension    Hip abduction    Hip adduction    Hip internal rotation    Hip external rotation    Knee flexion 121 127  Knee extension -4 0  Ankle dorsiflexion    Ankle plantarflexion    Ankle inversion    Ankle eversion     (Blank rows = not tested)  LOWER EXTREMITY MMT: no indicated 2/2    FUNCTIONAL TESTS:  UE required for STS    GAIT: Using single point cane; decreased L stance time, lack of full TKE, mildly antalgic  TODAY'S TREATMENT:                                                                                                                              DATE:   Treatment                            12/11:  Fwd & lateral step ups Heel raises & stretch on slant board Lateral stepping Walk through gym- added xride and removed leg ext/flexion machines Taichi/yoga discussion   11/25   Testing of the knee and edu of exam findings: TTP of L tibial plateau, TTP of posterior knee, hypermobility of L patella medial and laterally, mild increase in varus/valgus motion  41 cm suprapatellar edema measurement  Self pain management, appropriate HEP, returning to MD  Eval:   Program Notes Ice 2-3x 10-15 have leg proppedContinue with standing exercises from Home Health  Exercises - Supine Hip Adduction Isometric with Ball  - 2 x daily - 7 x weekly - 2 sets - 10 reps - 5 hold - Supine Bridge  - 2 x daily - 7 x weekly - 2 sets - 10 reps - Lateral Lunge Adductor Stretch with Counter Support  - 2 x daily - 7 x weekly - 1 sets - 3 reps - 30 hold -  Seated Long Arc Quad  - 2 x daily - 7 x weekly - 3 sets - 10 reps - Long Sitting Quad Set with Towel Roll Under Heel  - 2 x daily - 7 x weekly - 2 sets - 10 reps - 3 hold   PATIENT EDUCATION:  Education details: exam findings, anatomy, exercise progression, DOMS expectations, muscle firing,  envelope of function, HEP, POC Person educated: Patient Education method: Explanation, Demonstration, Tactile cues, Verbal cues, and Handouts Education comprehension: verbalized understanding, returned demonstration, verbal cues required, tactile cues required, and needs further education  HOME EXERCISE PROGRAM:  Access Code: [email or web address] URL: [email or web address]  ASSESSMENT:  CLINICAL IMPRESSION:  Focused on program for pt to get back into gym regularly. Pt did very well but noted that she has balance deficits so that was incorporated into the program.    OBJECTIVE IMPAIRMENTS: Abnormal gait, decreased activity tolerance, decreased mobility, difficulty walking, decreased ROM, decreased strength, increased edema, and pain.   ACTIVITY LIMITATIONS: carrying, lifting, bending, sitting, standing, squatting, sleeping, stairs, transfers, and locomotion level  PARTICIPATION LIMITATIONS: meal prep, cleaning, laundry, driving, shopping, community activity, occupation, and yard work  PERSONAL FACTORS: 1-2 comorbidities: right hip replacement  are also affecting patient's functional outcome.   REHAB POTENTIAL: Excellent  CLINICAL DECISION MAKING: Stable/uncomplicated  EVALUATION COMPLEXITY: Low   GOALS:   SHORT TERM GOALS: 05/06/2023    Pt  will become independent with HEP in order to demonstrate synthesis of PT education.  Baseline: Goal status: INITIAL  2.  Pt will be able to demonstrate full L knee TKE in order to demonstrate functional improvement in LE function for self-care and house hold duties.  Baseline:  Goal status: INITIAL  3.  Pt will be able to demonstrate  normal walking gait without SPC in clinic distances in order to demonstrate functional improvement in LE function for self-care and house hold duties.  Baseline:  Goal status: INITIAL  4.  Pt will score at least 12 pt increase on FOTO to demonstrate functional improvement in MCII and pt perceived function.   Baseline:  Goal status: INITIAL  LONG TERM GOALS: 06/17/2023   Pt  will become independent with final HEP in order to demonstrate synthesis of PT education.  Baseline:  Goal status: INITIAL  2. Pt will score >/= 66 on FOTO to demonstrate improvement in perceived L LE function.  Baseline:  Goal status: INITIAL  3. Pt will be able to demonstrate full reciprocal stair pattern with single UE in order to demonstrate functional improvement in LE function for community and household mobility.  Baseline:  Goal status: INITIAL  4.  Pt will be able to demonstrate/report ability to walk >30 mins without pain in order to demonstrate functional improvement and tolerance to exercise and community mobility.  Baseline:  Goal status: INITIAL  PLAN:  PT FREQUENCY: 1-2x/week  PT DURATION: 12 weeks  PLANNED INTERVENTIONS: Therapeutic exercises, Therapeutic activity, Neuromuscular re-education, Balance training, Gait training, Patient/Family education, Self Care, Joint mobilization, Stair training, DME instructions, Aquatic Therapy, Cryotherapy, Moist heat, Taping, and Manual therapy, Dry Needling  PLAN FOR NEXT SESSION:  progress balance, how did gym program go?  Deshun Sedivy C. Desarae Placide PT, DPT 04/17/23 11:46 AM

## 2023-04-22 ENCOUNTER — Encounter (HOSPITAL_BASED_OUTPATIENT_CLINIC_OR_DEPARTMENT_OTHER): Payer: Self-pay | Admitting: Physical Therapy

## 2023-04-22 ENCOUNTER — Ambulatory Visit (HOSPITAL_BASED_OUTPATIENT_CLINIC_OR_DEPARTMENT_OTHER): Payer: Medicare Other | Admitting: Physical Therapy

## 2023-04-22 DIAGNOSIS — M6281 Muscle weakness (generalized): Secondary | ICD-10-CM

## 2023-04-22 DIAGNOSIS — R600 Localized edema: Secondary | ICD-10-CM

## 2023-04-22 DIAGNOSIS — M25562 Pain in left knee: Secondary | ICD-10-CM | POA: Diagnosis not present

## 2023-04-22 DIAGNOSIS — R262 Difficulty in walking, not elsewhere classified: Secondary | ICD-10-CM

## 2023-04-22 NOTE — Therapy (Signed)
OUTPATIENT PHYSICAL THERAPY LOWER EXTREMITY TREATMENT   Patient Name: Diane Giles MRN: [id number] DOB:04/02/1956, 67 y.o., female Today's Date: 04/22/2023  END OF SESSION:  PT End of Session - 04/22/23 1257     Visit Number 4    Number of Visits 18    Date for PT Re-Evaluation 06/23/23    Authorization Type MCR    PT Start Time 1100    PT Stop Time 1143    PT Time Calculation (min) 43 min    Activity Tolerance Patient tolerated treatment well    Behavior During Therapy [facility] for tasks assessed/performed                Past Medical History:  Diagnosis Date   Aortic stenosis    mild-moderate AS 04/22/19 echo   Aortoiliac occlusive disease (HCC) 12/03/2016   Arthritis    Diabetes mellitus without complication (HCC)    pt states she has been told she is no longer diabetic and is not taking meds   Diverticulitis    Gout    Heart murmur    Hyperlipidemia    Hypertension    Osteoporosis    Sleep apnea    Tobacco use disorder 07/04/2012   Past Surgical History:  Procedure Laterality Date   ABDOMINAL AORTOGRAM W/LOWER EXTREMITY N/A 12/03/2016   Procedure: Abdominal Aortogram w/Lower Extremity;  Surgeon: Chuck Hint, MD;  Location: [facility] INVASIVE CV LAB;  Service: Cardiovascular;  Laterality: N/A;   AORTA -INNOMIATE BYPASS N/A 05/18/2019   Procedure: AORTA -INNOMIATE BYPASS Using Hemashield Gold Graft Size 8mm;  Surgeon: Alleen Borne, MD;  Location: [facility] OR;  Service: Open Heart Surgery;  Laterality: N/A;   aorto- fem bypass Bilateral    aortobiiliac bypass in 2000   DILATATION & CURETTAGE/HYSTEROSCOPY WITH MYOSURE N/A 12/27/2015   Procedure: DILATATION & CURETTAGE/HYSTEROSCOPY;  Surgeon: Patton Salles, MD;  Location: [facility] ORS;  Service: Gynecology;  Laterality: N/A;   DILATION AND CURETTAGE OF UTERUS  11/2015   ELBOW SURGERY Left ~2007   tendon repair by Dr. Ranell Patrick   EYE SURGERY Bilateral 2008   lasik   JOINT REPLACEMENT     lower aortic  bypass  2000   per patient   PERIPHERAL VASCULAR INTERVENTION  12/03/2016   Stenting of anastomotic stenosis of the aortoiliac and right common iliac artery with 7 x 39 mm VBX covered stent and postdilated with 9 mm balloon.   Right innonimate bypass  05/18/2019   Aorto innominate bypass surgery with median sternotomy   STERNOTOMY  05/18/2019   Right aorto-innonimate bypass surgery 05/18/19 Dr. Laneta Simmers   TOTAL HIP ARTHROPLASTY Right 07/27/2014   Procedure: RIGHT TOTAL HIP ARTHROPLASTY ANTERIOR APPROACH;  Surgeon: Durene Romans, MD;  Location: [facility] ORS;  Service: Orthopedics;  Laterality: Right;   TOTAL KNEE ARTHROPLASTY Right 05/28/2022   Procedure: RIGHT TOTAL KNEE ARTHROPLASTY;  Surgeon: Tarry Kos, MD;  Location: [facility] OR;  Service: Orthopedics;  Laterality: Right;   TOTAL KNEE ARTHROPLASTY Left 03/04/2023   Procedure: TOTAL KNEE ARTHROPLASTY;  Surgeon: Tarry Kos, MD;  Location: [facility] OR;  Service: Orthopedics;  Laterality: Left;   Patient Active Problem List   Diagnosis Date Noted   Status post total left knee replacement 03/04/2023   Primary osteoarthritis of left knee 07/10/2022   Status post total right knee replacement 05/28/2022   Vitamin B12 deficiency 10/31/2021   Small bowel obstruction (HCC)    Partial small bowel obstruction (HCC) 10/28/2021   PAD (peripheral  artery disease) (HCC)    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   Aortoiliac occlusive disease (HCC) 12/03/2016   Centrilobular emphysema (HCC) 10/03/2016   Right groin pain 02/27/2016   Sacroiliitis (HCC) 02/27/2016   Postmenopausal bleeding 12/01/2015   Psoas tendinitis of right side 10/21/2015   Endometrial thickening on ultrasound 07/28/2015   S/P right THA, 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   Type 2 diabetes mellitus (HCC) 05/18/2014   DDD (degenerative disc disease) 03/18/2013   Metabolic syndrome 07/29/2012   Essential hypertension, benign 07/18/2012   Hyperlipidemia 07/18/2012   Obstructive sleep apnea  07/04/2012   Osteoporosis 07/04/2012   Morbid obesity (HCC) 07/04/2012    PCP: Lula Olszewski MD   REFERRING PROVIDER: Neomia Dear PA   REFERRING DIAG: Right TKA  THERAPY DIAG:  Left knee pain, unspecified chronicity  Difficulty walking  Muscle weakness (generalized)  Localized edema  Rationale for Evaluation and Treatment: Rehabilitation  ONSET DATE: 10/28 L TKA  Days since surgery: 49   SUBJECTIVE:   SUBJECTIVE STATEMENT:  The patient reports the swelling has improved since the fall. She is otherwise doing well.   Eval:  Pt states she fell on Thursday 11/14 after seeing MD for L knee TKA. She does not recall a LOB or LOC but she just remembers landing on the L knee on a bathmat. Pt states the swelling does not seem to going away. There is a sharp/stinging in the groin. Direct pressure will cause pain into the groin and HS. Pt reports landing on the knees and fell fwd. She did hit her head and did not have LOC of concussion symptoms. Pt did not skin/cut the the L knee. Denies s/s on infection.    Pt has had 2 wks of HHPT. Pt did very well with HHPT and was immediately able to bend up to 115 of flexion and -2 of ext.   PERTINENT HISTORY: Aortic stenosis; Pre-diabetic,  Gout ( hasn't had an attack in 1.5 years) Osteopetrosis,  R THA,  Left ebow surgery 2007; R TKA PAIN:  Are you having pain? Yes: NPRS scale: 0/10 Pain location: L knee  Pain description: aching  Aggravating factors: movement Relieving factors: rest   PRECAUTIONS: None  WEIGHT BEARING RESTRICTIONS: No  FALLS:  Has patient fallen in last 6 months? No  LIVING ENVIRONMENT: 15  going to the second floor. 2 steps going in.   OCCUPATION:  Retired  Presenter, broadcasting: exercise; [employer] member   PLOF: Independent  PATIENT GOALS:  to get back into her normal routine  OBJECTIVE:   DIAGNOSTIC FINDINGS: 10/28  IMPRESSION: Interval total left knee arthroplasty without evidence of  hardware failure.  PATIENT SURVEYS:  FOTO 42 66@ DC 12 pts MCII   COGNITION: Overall cognitive status: Within functional limits for tasks assessed     SENSATION: WFL  EDEMA:  Circumferential: 42.5 cm vs 38 cm on R   MUSCLE LENGTH: limited adductor length   POSTURE: No Significant postural limitations  PALPATION: TTP of L quad, adductor, and patellar borders  Wound clean and dry with steri strips in place  LOWER EXTREMITY ROM:  Passive ROM L eval R eval  Hip flexion    Hip extension    Hip abduction    Hip adduction    Hip internal rotation    Hip external rotation    Knee flexion 121 127  Knee extension -4 0  Ankle dorsiflexion    Ankle plantarflexion    Ankle inversion    Ankle eversion     (  Blank rows = not tested)  LOWER EXTREMITY MMT: no indicated 2/2    FUNCTIONAL TESTS:  UE required for STS    GAIT: Using single point cane; decreased L stance time, lack of full TKE, mildly antalgic  TODAY'S TREATMENT:                                                                                                                              DATE:  12/16 Manual: reviewed patella ROM   SLR 3x10 1.5 lbs  SAQ 3x10 1.5 lbs    Hip abduction 3x12 70 lbs  Leg press 85 lbs 3x15    Lateral  step 2x10 6 inch  Front step up 2x10 6 inch      Treatment                            12/11:  Fwd & lateral step ups Heel raises & stretch on slant board Lateral stepping Walk through gym- added xride and removed leg ext/flexion machines Taichi/yoga discussion   11/25   Testing of the knee and edu of exam findings: TTP of L tibial plateau, TTP of posterior knee, hypermobility of L patella medial and laterally, mild increase in varus/valgus motion  41 cm suprapatellar edema measurement  Self pain management, appropriate HEP, returning to MD  Eval:   Program Notes Ice 2-3x 10-15 have leg proppedContinue with standing exercises from Home Health  Exercises -  Supine Hip Adduction Isometric with Ball  - 2 x daily - 7 x weekly - 2 sets - 10 reps - 5 hold - Supine Bridge  - 2 x daily - 7 x weekly - 2 sets - 10 reps - Lateral Lunge Adductor Stretch with Counter Support  - 2 x daily - 7 x weekly - 1 sets - 3 reps - 30 hold - Seated Long Arc Quad  - 2 x daily - 7 x weekly - 3 sets - 10 reps - Long Sitting Quad Set with Towel Roll Under Heel  - 2 x daily - 7 x weekly - 2 sets - 10 reps - 3 hold   PATIENT EDUCATION:  Education details: exam findings, anatomy, exercise progression, DOMS expectations, muscle firing,  envelope of function, HEP, POC Person educated: Patient Education method: Explanation, Demonstration, Tactile cues, Verbal cues, and Handouts Education comprehension: verbalized understanding, returned demonstration, verbal cues required, tactile cues required, and needs further education  HOME EXERCISE PROGRAM:  Access Code: [email or web address] URL: [email or web address]  ASSESSMENT:  CLINICAL IMPRESSION: The patient continues to have full ROM. We worked on her patella motion today. She was mildly limited in inferior / superior glide. Overall she tolerated treatment well. We worked on gym exercises and steps. She had no increase in pain> She would like to review a balance program at some point.   OBJECTIVE IMPAIRMENTS: Abnormal gait, decreased activity tolerance, decreased mobility, difficulty walking, decreased ROM, decreased strength,  increased edema, and pain.   ACTIVITY LIMITATIONS: carrying, lifting, bending, sitting, standing, squatting, sleeping, stairs, transfers, and locomotion level  PARTICIPATION LIMITATIONS: meal prep, cleaning, laundry, driving, shopping, community activity, occupation, and yard work  PERSONAL FACTORS: 1-2 comorbidities: right hip replacement  are also affecting patient's functional outcome.   REHAB POTENTIAL: Excellent  CLINICAL DECISION MAKING: Stable/uncomplicated  EVALUATION COMPLEXITY:  Low   GOALS:   SHORT TERM GOALS: 05/06/2023    Pt will become independent with HEP in order to demonstrate synthesis of PT education.  Baseline: Goal status: INITIAL  2.  Pt will be able to demonstrate full L knee TKE in order to demonstrate functional improvement in LE function for self-care and house hold duties.  Baseline:  Goal status: INITIAL  3.  Pt will be able to demonstrate normal walking gait without SPC in clinic distances in order to demonstrate functional improvement in LE function for self-care and house hold duties.  Baseline:  Goal status: INITIAL  4.  Pt will score at least 12 pt increase on FOTO to demonstrate functional improvement in MCII and pt perceived function.   Baseline:  Goal status: INITIAL  LONG TERM GOALS: 06/17/2023   Pt  will become independent with final HEP in order to demonstrate synthesis of PT education.  Baseline:  Goal status: INITIAL  2. Pt will score >/= 66 on FOTO to demonstrate improvement in perceived L LE function.  Baseline:  Goal status: INITIAL  3. Pt will be able to demonstrate full reciprocal stair pattern with single UE in order to demonstrate functional improvement in LE function for community and household mobility.  Baseline:  Goal status: INITIAL  4.  Pt will be able to demonstrate/report ability to walk >30 mins without pain in order to demonstrate functional improvement and tolerance to exercise and community mobility.  Baseline:  Goal status: INITIAL  PLAN:  PT FREQUENCY: 1-2x/week  PT DURATION: 12 weeks  PLANNED INTERVENTIONS: Therapeutic exercises, Therapeutic activity, Neuromuscular re-education, Balance training, Gait training, Patient/Family education, Self Care, Joint mobilization, Stair training, DME instructions, Aquatic Therapy, Cryotherapy, Moist heat, Taping, and Manual therapy, Dry Needling  PLAN FOR NEXT SESSION:  progress balance, how did gym program go?  Jessica C. Hightower PT,  DPT 04/22/23 1:01 PM

## 2023-04-23 ENCOUNTER — Encounter: Payer: Medicare Other | Admitting: Physician Assistant

## 2023-04-25 ENCOUNTER — Encounter (HOSPITAL_BASED_OUTPATIENT_CLINIC_OR_DEPARTMENT_OTHER): Payer: Self-pay | Admitting: Physical Therapy

## 2023-04-25 ENCOUNTER — Ambulatory Visit (HOSPITAL_BASED_OUTPATIENT_CLINIC_OR_DEPARTMENT_OTHER): Payer: Medicare Other | Admitting: Physical Therapy

## 2023-04-25 DIAGNOSIS — M25562 Pain in left knee: Secondary | ICD-10-CM | POA: Diagnosis not present

## 2023-04-25 DIAGNOSIS — R262 Difficulty in walking, not elsewhere classified: Secondary | ICD-10-CM

## 2023-04-25 DIAGNOSIS — R600 Localized edema: Secondary | ICD-10-CM

## 2023-04-25 DIAGNOSIS — M6281 Muscle weakness (generalized): Secondary | ICD-10-CM

## 2023-04-25 NOTE — Therapy (Signed)
OUTPATIENT PHYSICAL THERAPY LOWER EXTREMITY TREATMENT   Patient Name: Diane Giles MRN: [id number] DOB:1956-04-21, 67 y.o., female Today's Date: 04/25/2023  END OF SESSION:  PT End of Session - 04/25/23 1134     Visit Number 5    Number of Visits 18    Date for PT Re-Evaluation 06/23/23    Authorization Type MCR    PT Start Time 1105    PT Stop Time 1134    PT Time Calculation (min) 29 min    Activity Tolerance Patient tolerated treatment well    Behavior During Therapy [facility] for tasks assessed/performed                 Past Medical History:  Diagnosis Date   Aortic stenosis    mild-moderate AS 04/22/19 echo   Aortoiliac occlusive disease (HCC) 12/03/2016   Arthritis    Diabetes mellitus without complication (HCC)    pt states she has been told she is no longer diabetic and is not taking meds   Diverticulitis    Gout    Heart murmur    Hyperlipidemia    Hypertension    Osteoporosis    Sleep apnea    Tobacco use disorder 07/04/2012   Past Surgical History:  Procedure Laterality Date   ABDOMINAL AORTOGRAM W/LOWER EXTREMITY N/A 12/03/2016   Procedure: Abdominal Aortogram w/Lower Extremity;  Surgeon: Chuck Hint, MD;  Location: [facility] INVASIVE CV LAB;  Service: Cardiovascular;  Laterality: N/A;   AORTA -INNOMIATE BYPASS N/A 05/18/2019   Procedure: AORTA -INNOMIATE BYPASS Using Hemashield Gold Graft Size 8mm;  Surgeon: Alleen Borne, MD;  Location: [facility] OR;  Service: Open Heart Surgery;  Laterality: N/A;   aorto- fem bypass Bilateral    aortobiiliac bypass in 2000   DILATATION & CURETTAGE/HYSTEROSCOPY WITH MYOSURE N/A 12/27/2015   Procedure: DILATATION & CURETTAGE/HYSTEROSCOPY;  Surgeon: Patton Salles, MD;  Location: [facility] ORS;  Service: Gynecology;  Laterality: N/A;   DILATION AND CURETTAGE OF UTERUS  11/2015   ELBOW SURGERY Left ~2007   tendon repair by Dr. Ranell Patrick   EYE SURGERY Bilateral 2008   lasik   JOINT REPLACEMENT     lower aortic  bypass  2000   per patient   PERIPHERAL VASCULAR INTERVENTION  12/03/2016   Stenting of anastomotic stenosis of the aortoiliac and right common iliac artery with 7 x 39 mm VBX covered stent and postdilated with 9 mm balloon.   Right innonimate bypass  05/18/2019   Aorto innominate bypass surgery with median sternotomy   STERNOTOMY  05/18/2019   Right aorto-innonimate bypass surgery 05/18/19 Dr. Laneta Simmers   TOTAL HIP ARTHROPLASTY Right 07/27/2014   Procedure: RIGHT TOTAL HIP ARTHROPLASTY ANTERIOR APPROACH;  Surgeon: Durene Romans, MD;  Location: [facility] OR[facility];  Service: Orthopedics;  Laterality: Right;   TOTAL KNEE ARTHROPLASTY Right 05/28/2022   Procedure: RIGHT TOTAL KNEE ARTHROPLASTY;  Surgeon: Tarry Kos, MD;  Location: [facility] OR;  Service: Orthopedics;  Laterality: Right;   TOTAL KNEE ARTHROPLASTY Left 03/04/2023   Procedure: TOTAL KNEE ARTHROPLASTY;  Surgeon: Tarry Kos, MD;  Location: [facility] OR;  Service: Orthopedics;  Laterality: Left;   Patient Active Problem List   Diagnosis Date Noted   Status post total left knee replacement 03/04/2023   Primary osteoarthritis of left knee 07/10/2022   Status post total right knee replacement 05/28/2022   Vitamin B12 deficiency 10/31/2021   Small bowel obstruction (HCC)    Partial small bowel obstruction (HCC) 10/28/2021   PAD (  peripheral artery disease) (HCC)    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   Aortoiliac occlusive disease (HCC) 12/03/2016   Centrilobular emphysema (HCC) 10/03/2016   Right groin pain 02/27/2016   Sacroiliitis (HCC) 02/27/2016   Postmenopausal bleeding 12/01/2015   Psoas tendinitis of right side 10/21/2015   Endometrial thickening on ultrasound 07/28/2015   S/P right THA, 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   Type 2 diabetes mellitus (HCC) 05/18/2014   DDD (degenerative disc disease) 03/18/2013   Metabolic syndrome 07/29/2012   Essential hypertension, benign 07/18/2012   Hyperlipidemia 07/18/2012   Obstructive sleep apnea  07/04/2012   Osteoporosis 07/04/2012   Morbid obesity (HCC) 07/04/2012    PCP: Lula Olszewski MD   REFERRING PROVIDER: Neomia Dear PA   REFERRING DIAG: Right TKA  THERAPY DIAG:  Left knee pain, unspecified chronicity  Difficulty walking  Muscle weakness (generalized)  Localized edema  Rationale for Evaluation and Treatment: Rehabilitation  ONSET DATE: 10/28 L TKA  Days since surgery: 52   SUBJECTIVE:   SUBJECTIVE STATEMENT:  Pt states the knee is doing well. No pain or soreness after last session.   Eval:  Pt states she fell on Thursday 11/14 after seeing MD for L knee TKA. She does not recall a LOB or LOC but she just remembers landing on the L knee on a bathmat. Pt states the swelling does not seem to going away. There is a sharp/stinging in the groin. Direct pressure will cause pain into the groin and HS. Pt reports landing on the knees and fell fwd. She did hit her head and did not have LOC of concussion symptoms. Pt did not skin/cut the the L knee. Denies s/s on infection.    Pt has had 2 wks of HHPT. Pt did very well with HHPT and was immediately able to bend up to 115 of flexion and -2 of ext.   PERTINENT HISTORY: Aortic stenosis; Pre-diabetic,  Gout ( hasn't had an attack in 1.5 years) Osteopetrosis,  R THA,  Left ebow surgery 2007; R TKA PAIN:  Are you having pain? No: NPRS scale: 0/10 Pain location: L knee  Pain description: aching  Aggravating factors: movement Relieving factors: rest   PRECAUTIONS: None  WEIGHT BEARING RESTRICTIONS: No  FALLS:  Has patient fallen in last 6 months? No  LIVING ENVIRONMENT: 15  going to the second floor. 2 steps going in.   OCCUPATION:  Retired  Presenter, broadcasting: exercise; [employer] member   PLOF: Independent  PATIENT GOALS:  to get back into her normal routine  OBJECTIVE:   DIAGNOSTIC FINDINGS: 10/28  IMPRESSION: Interval total left knee arthroplasty without evidence of hardware failure.  PATIENT SURVEYS:   FOTO 42 66@ DC 12 pts MCII   COGNITION: Overall cognitive status: Within functional limits for tasks assessed     SENSATION: WFL  EDEMA:  Circumferential: 42.5 cm vs 38 cm on R   MUSCLE LENGTH: limited adductor length   POSTURE: No Significant postural limitations  PALPATION: TTP of L quad, adductor, and patellar borders  Wound clean and dry with steri strips in place  LOWER EXTREMITY ROM:  Passive ROM L eval R eval  Hip flexion    Hip extension    Hip abduction    Hip adduction    Hip internal rotation    Hip external rotation    Knee flexion 121 127  Knee extension -4 0  Ankle dorsiflexion    Ankle plantarflexion    Ankle inversion    Ankle eversion     (  Blank rows = not tested)  LOWER EXTREMITY MMT: no indicated 2/2    FUNCTIONAL TESTS:  UE required for STS    GAIT: Using single point cane; decreased L stance time, lack of full TKE, mildly antalgic  TODAY'S TREATMENT:                                                                                                                              DATE:   12/19  Flexion 124, Ext -3 to start ; 1 deg hyper after manual  L TKE joint mob grade III, tibial ER Patellar mob in all directions grade III  SLR 2lbs 3x10 (Focus on TKE) LAQ 5lbs 3x10 SLS 30s 3x STS 3x8     12/16 Manual: reviewed patella ROM   SLR 3x10 1.5 lbs  SAQ 3x10 1.5 lbs    Hip abduction 3x12 70 lbs  Leg press 85 lbs 3x15    Lateral  step 2x10 6 inch  Front step up 2x10 6 inch      Treatment                            12/11:  Fwd & lateral step ups Heel raises & stretch on slant board Lateral stepping Walk through gym- added xride and removed leg ext/flexion machines Taichi/yoga discussion   11/25   Testing of the knee and edu of exam findings: TTP of L tibial plateau, TTP of posterior knee, hypermobility of L patella medial and laterally, mild increase in varus/valgus motion  41 cm suprapatellar edema  measurement  Self pain management, appropriate HEP, returning to MD  Eval:   Program Notes Ice 2-3x 10-15 have leg proppedContinue with standing exercises from Home Health  Exercises - Supine Hip Adduction Isometric with Ball  - 2 x daily - 7 x weekly - 2 sets - 10 reps - 5 hold - Supine Bridge  - 2 x daily - 7 x weekly - 2 sets - 10 reps - Lateral Lunge Adductor Stretch with Counter Support  - 2 x daily - 7 x weekly - 1 sets - 3 reps - 30 hold - Seated Long Arc Quad  - 2 x daily - 7 x weekly - 3 sets - 10 reps - Long Sitting Quad Set with Towel Roll Under Heel  - 2 x daily - 7 x weekly - 2 sets - 10 reps - 3 hold   PATIENT EDUCATION:  Education details: exam findings, anatomy, exercise progression, DOMS expectations, muscle firing,  envelope of function, HEP, POC Person educated: Patient Education method: Explanation, Demonstration, Tactile cues, Verbal cues, and Handouts Education comprehension: verbalized understanding, returned demonstration, verbal cues required, tactile cues required, and needs further education  HOME EXERCISE PROGRAM:  Access Code: [email or web address] URL: [email or web address]  ASSESSMENT:  CLINICAL IMPRESSION: Pt able to progress SL stability exercise today as well as progress CKC and [place] strengthening of the L  knee. Pt able to reach 124 flexion and 1 deg of ext today following manual therapy. Pt HEP updated accordingly. Pt with very good tolerance to all exercise without pain. Plan to continue per TKA protocol to pt tolerance. Pt would benefit from continued skilled therapy in order to reach goals and maximize functional L LE strength and ROM for return to PLOF and normal ADL/community mobility.   OBJECTIVE IMPAIRMENTS: Abnormal gait, decreased activity tolerance, decreased mobility, difficulty walking, decreased ROM, decreased strength, increased edema, and pain.   ACTIVITY LIMITATIONS: carrying, lifting, bending, sitting, standing, squatting,  sleeping, stairs, transfers, and locomotion level  PARTICIPATION LIMITATIONS: meal prep, cleaning, laundry, driving, shopping, community activity, occupation, and yard work  PERSONAL FACTORS: 1-2 comorbidities: right hip replacement  are also affecting patient's functional outcome.   REHAB POTENTIAL: Excellent  CLINICAL DECISION MAKING: Stable/uncomplicated  EVALUATION COMPLEXITY: Low   GOALS:   SHORT TERM GOALS: 05/06/2023    Pt will become independent with HEP in order to demonstrate synthesis of PT education.  Baseline: Goal status: INITIAL  2.  Pt will be able to demonstrate full L knee TKE in order to demonstrate functional improvement in LE function for self-care and house hold duties.  Baseline:  Goal status: INITIAL  3.  Pt will be able to demonstrate normal walking gait without SPC in clinic distances in order to demonstrate functional improvement in LE function for self-care and house hold duties.  Baseline:  Goal status: INITIAL  4.  Pt will score at least 12 pt increase on FOTO to demonstrate functional improvement in MCII and pt perceived function.   Baseline:  Goal status: INITIAL  LONG TERM GOALS: 06/17/2023   Pt  will become independent with final HEP in order to demonstrate synthesis of PT education.  Baseline:  Goal status: INITIAL  2. Pt will score >/= 66 on FOTO to demonstrate improvement in perceived L LE function.  Baseline:  Goal status: INITIAL  3. Pt will be able to demonstrate full reciprocal stair pattern with single UE in order to demonstrate functional improvement in LE function for community and household mobility.  Baseline:  Goal status: INITIAL  4.  Pt will be able to demonstrate/report ability to walk >30 mins without pain in order to demonstrate functional improvement and tolerance to exercise and community mobility.  Baseline:  Goal status: INITIAL  PLAN:  PT FREQUENCY: 1-2x/week  PT DURATION: 12 weeks  PLANNED  INTERVENTIONS: Therapeutic exercises, Therapeutic activity, Neuromuscular re-education, Balance training, Gait training, Patient/Family education, Self Care, Joint mobilization, Stair training, DME instructions, Aquatic Therapy, Cryotherapy, Moist heat, Taping, and Manual therapy, Dry Needling  PLAN FOR NEXT SESSION:  progress balance, how did gym program go?  Jessica C. Hightower PT, DPT 04/25/23 11:37 AM

## 2023-04-29 ENCOUNTER — Other Ambulatory Visit (HOSPITAL_BASED_OUTPATIENT_CLINIC_OR_DEPARTMENT_OTHER): Payer: Self-pay

## 2023-04-29 ENCOUNTER — Ambulatory Visit (HOSPITAL_BASED_OUTPATIENT_CLINIC_OR_DEPARTMENT_OTHER): Payer: Medicare Other | Admitting: Physical Therapy

## 2023-04-29 ENCOUNTER — Other Ambulatory Visit: Payer: Self-pay

## 2023-04-29 ENCOUNTER — Encounter (HOSPITAL_BASED_OUTPATIENT_CLINIC_OR_DEPARTMENT_OTHER): Payer: Self-pay | Admitting: Physical Therapy

## 2023-04-29 DIAGNOSIS — R600 Localized edema: Secondary | ICD-10-CM

## 2023-04-29 DIAGNOSIS — M25562 Pain in left knee: Secondary | ICD-10-CM

## 2023-04-29 DIAGNOSIS — M6281 Muscle weakness (generalized): Secondary | ICD-10-CM

## 2023-04-29 DIAGNOSIS — R262 Difficulty in walking, not elsewhere classified: Secondary | ICD-10-CM

## 2023-04-29 MED ORDER — LOSARTAN POTASSIUM 50 MG PO TABS
50.0000 mg | ORAL_TABLET | Freq: Every day | ORAL | 4 refills | Status: DC
  Filled 2023-05-06 (×2): qty 90, 90d supply, fill #0
  Filled 2023-05-07: qty 30, 30d supply, fill #0
  Filled 2023-05-07: qty 90, 90d supply, fill #0
  Filled 2023-05-27 – 2023-06-04 (×2): qty 30, 30d supply, fill #1
  Filled 2023-07-22: qty 30, 30d supply, fill #2
  Filled 2023-09-09 – 2023-09-10 (×2): qty 30, 30d supply, fill #3

## 2023-04-29 MED ORDER — CALCIUM CITRATE-VITAMIN D 315-5 MG-MCG PO TABS
1.0000 | ORAL_TABLET | Freq: Every day | ORAL | 99 refills | Status: DC
  Filled 2023-05-06: qty 30, 30d supply, fill #0
  Filled 2023-05-07: qty 120, 120d supply, fill #0
  Filled 2023-05-07: qty 30, 30d supply, fill #0
  Filled 2023-05-27 – 2023-06-04 (×2): qty 30, 30d supply, fill #1
  Filled 2023-06-20 – 2023-06-26 (×2): qty 30, 30d supply, fill #2
  Filled 2023-07-11 – 2023-07-24 (×4): qty 30, 30d supply, fill #3
  Filled 2023-08-12: qty 30, 30d supply, fill #4
  Filled ????-??-??: fill #4
  Filled ????-??-?? (×2): fill #3

## 2023-04-29 MED ORDER — ALLOPURINOL 100 MG PO TABS
100.0000 mg | ORAL_TABLET | Freq: Every day | ORAL | 4 refills | Status: DC
  Filled 2023-05-06 (×2): qty 90, 90d supply, fill #0
  Filled 2023-05-07: qty 30, 30d supply, fill #0
  Filled 2023-05-07: qty 90, 90d supply, fill #0
  Filled 2023-05-27 – 2023-06-04 (×2): qty 30, 30d supply, fill #1
  Filled 2023-06-20 – 2023-06-26 (×2): qty 30, 30d supply, fill #2
  Filled 2023-07-11 – 2023-07-24 (×3): qty 30, 30d supply, fill #3
  Filled 2023-08-12 – 2023-09-10 (×6): qty 30, 30d supply, fill #4
  Filled ????-??-??: fill #4
  Filled ????-??-??: fill #3

## 2023-04-29 MED ORDER — VITAMIN D3 25 MCG (1000 UNIT) PO TABS
25.0000 ug | ORAL_TABLET | Freq: Every morning | ORAL | 4 refills | Status: AC
  Filled 2023-05-06 – 2023-05-07 (×3): qty 90, 90d supply, fill #0
  Filled 2023-05-07: qty 30, 30d supply, fill #0
  Filled 2023-05-27 – 2023-06-04 (×2): qty 30, 30d supply, fill #1
  Filled 2023-06-20 – 2023-06-26 (×2): qty 30, 30d supply, fill #2
  Filled 2023-07-11 – 2023-07-24 (×3): qty 30, 30d supply, fill #3
  Filled 2023-08-12 – 2023-09-10 (×6): qty 30, 30d supply, fill #4
  Filled ????-??-??: fill #3
  Filled ????-??-??: fill #4

## 2023-04-29 MED ORDER — PREGABALIN 75 MG PO CAPS
75.0000 mg | ORAL_CAPSULE | Freq: Two times a day (BID) | ORAL | 2 refills | Status: DC
  Filled 2023-05-10: qty 60, 30d supply, fill #0
  Filled 2023-06-20: qty 60, 30d supply, fill #1
  Filled 2023-07-02 – 2023-07-22 (×2): qty 60, 30d supply, fill #2
  Filled 2023-08-29 – 2023-09-10 (×3): qty 60, 30d supply, fill #3

## 2023-04-29 MED ORDER — BUPROPION HCL ER (XL) 150 MG PO TB24
150.0000 mg | ORAL_TABLET | Freq: Every morning | ORAL | 3 refills | Status: DC
  Filled 2023-05-06 (×2): qty 90, 90d supply, fill #0
  Filled 2023-05-07: qty 30, 30d supply, fill #0
  Filled 2023-05-07: qty 90, 90d supply, fill #0
  Filled 2023-05-27 – 2023-06-04 (×2): qty 30, 30d supply, fill #1
  Filled 2023-06-20 – 2023-06-26 (×2): qty 30, 30d supply, fill #2
  Filled 2023-07-11 – 2023-07-24 (×3): qty 30, 30d supply, fill #3
  Filled 2023-08-12 – 2023-09-10 (×6): qty 30, 30d supply, fill #4
  Filled ????-??-??: fill #4
  Filled ????-??-??: fill #3

## 2023-04-29 MED ORDER — MAGNESIUM OXIDE 400 MG PO TABS
400.0000 mg | ORAL_TABLET | Freq: Every day | ORAL | 3 refills | Status: DC
  Filled 2023-05-06 (×2): qty 90, 90d supply, fill #0
  Filled 2023-05-07: qty 30, 30d supply, fill #0
  Filled 2023-05-07: qty 90, 90d supply, fill #0
  Filled 2023-05-27 – 2023-06-04 (×2): qty 30, 30d supply, fill #1
  Filled 2023-06-20 – 2023-06-26 (×2): qty 30, 30d supply, fill #2
  Filled 2023-07-11 – 2023-07-22 (×2): qty 30, 30d supply, fill #3

## 2023-04-29 MED ORDER — ATENOLOL 25 MG PO TABS
25.0000 mg | ORAL_TABLET | Freq: Every day | ORAL | 4 refills | Status: DC
  Filled 2023-05-06 – 2023-05-07 (×3): qty 90, 90d supply, fill #0
  Filled 2023-05-07: qty 30, 30d supply, fill #0
  Filled 2023-05-27 – 2023-06-04 (×2): qty 30, 30d supply, fill #1
  Filled 2023-06-20 – 2023-06-26 (×2): qty 30, 30d supply, fill #2
  Filled 2023-07-11 – 2023-07-24 (×3): qty 30, 30d supply, fill #3
  Filled 2023-08-12 – 2023-09-10 (×6): qty 30, 30d supply, fill #4
  Filled ????-??-??: fill #4
  Filled ????-??-??: fill #3

## 2023-04-29 MED ORDER — ATORVASTATIN CALCIUM 80 MG PO TABS: 80.0000 mg | ORAL_TABLET | Freq: Every day | ORAL | 4 refills | Status: DC

## 2023-04-29 NOTE — Therapy (Signed)
OUTPATIENT PHYSICAL THERAPY LOWER EXTREMITY TREATMENT   Patient Name: Diane Giles MRN: [id number] DOB:[birth year], 67 y.o., female Today's Date: 04/29/2023  END OF SESSION:  PT End of Session - 04/29/23 1101     Visit Number 6    Number of Visits 18    Date for PT Re-Evaluation 06/23/23    Authorization Type MCR    PT Start Time 1100    PT Stop Time 1139    PT Time Calculation (min) 39 min    Activity Tolerance Patient tolerated treatment well    Behavior During Therapy [facility] for tasks assessed/performed                 Past Medical History:  Diagnosis Date   Aortic stenosis    mild-moderate AS 04/22/19 echo   Aortoiliac occlusive disease (HCC) 12/03/2016   Arthritis    Diabetes mellitus without complication (HCC)    pt states she has been told she is no longer diabetic and is not taking meds   Diverticulitis    Gout    Heart murmur    Hyperlipidemia    Hypertension    Osteoporosis    Sleep apnea    Tobacco use disorder 07/04/2012   Past Surgical History:  Procedure Laterality Date   ABDOMINAL AORTOGRAM W/LOWER EXTREMITY N/A 12/03/2016   Procedure: Abdominal Aortogram w/Lower Extremity;  Surgeon: Chuck Hint, MD;  Location: [facility] INVASIVE CV LAB;  Service: Cardiovascular;  Laterality: N/A;   AORTA -INNOMIATE BYPASS N/A 05/18/2019   Procedure: AORTA -INNOMIATE BYPASS Using Hemashield Gold Graft Size 8mm;  Surgeon: Alleen Borne, MD;  Location: [facility] OR;  Service: Open Heart Surgery;  Laterality: N/A;   aorto- fem bypass Bilateral    aortobiiliac bypass in 2000   DILATATION & CURETTAGE/HYSTEROSCOPY WITH MYOSURE N/A 12/27/2015   Procedure: DILATATION & CURETTAGE/HYSTEROSCOPY;  Surgeon: Patton Salles, MD;  Location: [facility] ORS;  Service: Gynecology;  Laterality: N/A;   DILATION AND CURETTAGE OF UTERUS  11/2015   ELBOW SURGERY Left ~2007   tendon repair by Dr. Ranell Patrick   EYE SURGERY Bilateral 2008   lasik   JOINT REPLACEMENT     lower aortic  bypass  2000   per patient   PERIPHERAL VASCULAR INTERVENTION  12/03/2016   Stenting of anastomotic stenosis of the aortoiliac and right common iliac artery with 7 x 39 mm VBX covered stent and postdilated with 9 mm balloon.   Right innonimate bypass  05/18/2019   Aorto innominate bypass surgery with median sternotomy   STERNOTOMY  05/18/2019   Right aorto-innonimate bypass surgery 05/18/19 Dr. Laneta Simmers   TOTAL HIP ARTHROPLASTY Right 07/27/2014   Procedure: RIGHT TOTAL HIP ARTHROPLASTY ANTERIOR APPROACH;  Surgeon: Durene Romans, MD;  Location: [facility] ORS;  Service: Orthopedics;  Laterality: Right;   TOTAL KNEE ARTHROPLASTY Right 05/28/2022   Procedure: RIGHT TOTAL KNEE ARTHROPLASTY;  Surgeon: Tarry Kos, MD;  Location: [facility] OR;  Service: Orthopedics;  Laterality: Right;   TOTAL KNEE ARTHROPLASTY Left 03/04/2023   Procedure: TOTAL KNEE ARTHROPLASTY;  Surgeon: Tarry Kos, MD;  Location: [facility] OR;  Service: Orthopedics;  Laterality: Left;   Patient Active Problem List   Diagnosis Date Noted   Status post total left knee replacement 03/04/2023   Primary osteoarthritis of left knee 07/10/2022   Status post total right knee replacement 05/28/2022   Vitamin B12 deficiency 10/31/2021   Small bowel obstruction (HCC)    Partial small bowel obstruction (HCC) 10/28/2021   PAD (  peripheral artery disease) (HCC)    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   Aortoiliac occlusive disease (HCC) 12/03/2016   Centrilobular emphysema (HCC) 10/03/2016   Right groin pain 02/27/2016   Sacroiliitis (HCC) 02/27/2016   Postmenopausal bleeding 12/01/2015   Psoas tendinitis of right side 10/21/2015   Endometrial thickening on ultrasound 07/28/2015   S/P right THA, 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   Type 2 diabetes mellitus (HCC) 05/18/2014   DDD (degenerative disc disease) 03/18/2013   Metabolic syndrome 07/29/2012   Essential hypertension, benign 07/18/2012   Hyperlipidemia 07/18/2012   Obstructive sleep apnea  07/04/2012   Osteoporosis 07/04/2012   Morbid obesity (HCC) 07/04/2012    PCP: Lula Olszewski MD   REFERRING PROVIDER: Neomia Dear PA   REFERRING DIAG: Right TKA  THERAPY DIAG:  Left knee pain, unspecified chronicity  Difficulty walking  Muscle weakness (generalized)  Localized edema  Rationale for Evaluation and Treatment: Rehabilitation  ONSET DATE: 10/28 L TKA  Days since surgery: 56   SUBJECTIVE:   SUBJECTIVE STATEMENT:  Pt states that the knee just feels stiff today from the cold. Otherwise, no issues. She was chasing her grandchildren this weekend.   Eval:  Pt states she fell on Thursday 11/14 after seeing MD for L knee TKA. She does not recall a LOB or LOC but she just remembers landing on the L knee on a bathmat. Pt states the swelling does not seem to going away. There is a sharp/stinging in the groin. Direct pressure will cause pain into the groin and HS. Pt reports landing on the knees and fell fwd. She did hit her head and did not have LOC of concussion symptoms. Pt did not skin/cut the the L knee. Denies s/s on infection.    Pt has had 2 wks of HHPT. Pt did very well with HHPT and was immediately able to bend up to 115 of flexion and -2 of ext.   PERTINENT HISTORY: Aortic stenosis; Pre-diabetic,  Gout ( hasn't had an attack in 1.5 years) Osteopetrosis,  R THA,  Left ebow surgery 2007; R TKA PAIN:  Are you having pain? No: NPRS scale: 0/10 Pain location: L knee  Pain description: aching  Aggravating factors: movement Relieving factors: rest   PRECAUTIONS: None  WEIGHT BEARING RESTRICTIONS: No  FALLS:  Has patient fallen in last 6 months? No  LIVING ENVIRONMENT: 15  going to the second floor. 2 steps going in.   OCCUPATION:  Retired  Presenter, broadcasting: exercise; [employer] member   PLOF: Independent  PATIENT GOALS:  to get back into her normal routine  OBJECTIVE:   DIAGNOSTIC FINDINGS: 10/28  IMPRESSION: Interval total left knee arthroplasty  without evidence of hardware failure.  PATIENT SURVEYS:  FOTO 42 66@ DC 12 pts MCII   COGNITION: Overall cognitive status: Within functional limits for tasks assessed     SENSATION: WFL  EDEMA:  Circumferential: 42.5 cm vs 38 cm on R   MUSCLE LENGTH: limited adductor length   POSTURE: No Significant postural limitations  PALPATION: TTP of L quad, adductor, and patellar borders  Wound clean and dry with steri strips in place  LOWER EXTREMITY ROM:  Passive ROM L eval R eval  Hip flexion    Hip extension    Hip abduction    Hip adduction    Hip internal rotation    Hip external rotation    Knee flexion 121 127  Knee extension -4 0  Ankle dorsiflexion    Ankle plantarflexion    Ankle inversion  Ankle eversion     (Blank rows = not tested)  LOWER EXTREMITY MMT: no indicated 2/2    FUNCTIONAL TESTS:  UE required for STS    GAIT: Using single point cane; decreased L stance time, lack of full TKE, mildly antalgic  TODAY'S TREATMENT:                                                                                                                              DATE:    12/23  Flexion 122, 1 deg hyper after manual  L TKE joint mob grade III, tibial ER Patellar mob in all directions grade III STM L quad (VL and rec fem)  Self stiffness management, safety with kneeling and kneel to stand, massage therapy options  12/19  Flexion 124, Ext -3 to start ; 1 deg hyper after manual  L TKE joint mob grade III, tibial ER Patellar mob in all directions grade III  SLR 2lbs 3x10 (Focus on TKE) LAQ 5lbs 3x10 SLS 30s 3x STS 3x8     12/16 Manual: reviewed patella ROM   SLR 3x10 1.5 lbs  SAQ 3x10 1.5 lbs    Hip abduction 3x12 70 lbs  Leg press 85 lbs 3x15    Lateral  step 2x10 6 inch  Front step up 2x10 6 inch      Treatment                            12/11:  Fwd & lateral step ups Heel raises & stretch on slant board Lateral stepping Walk  through gym- added xride and removed leg ext/flexion machines Taichi/yoga discussion   11/25   Testing of the knee and edu of exam findings: TTP of L tibial plateau, TTP of posterior knee, hypermobility of L patella medial and laterally, mild increase in varus/valgus motion  41 cm suprapatellar edema measurement  Self pain management, appropriate HEP, returning to MD  Eval:   Program Notes Ice 2-3x 10-15 have leg proppedContinue with standing exercises from Home Health  Exercises - Supine Hip Adduction Isometric with Ball  - 2 x daily - 7 x weekly - 2 sets - 10 reps - 5 hold - Supine Bridge  - 2 x daily - 7 x weekly - 2 sets - 10 reps - Lateral Lunge Adductor Stretch with Counter Support  - 2 x daily - 7 x weekly - 1 sets - 3 reps - 30 hold - Seated Long Arc Quad  - 2 x daily - 7 x weekly - 3 sets - 10 reps - Long Sitting Quad Set with Towel Roll Under Heel  - 2 x daily - 7 x weekly - 2 sets - 10 reps - 3 hold   PATIENT EDUCATION:  Education details: exam findings, anatomy, exercise progression, DOMS expectations, muscle firing,  envelope of function, HEP, POC Person educated: Patient Education method: Explanation, Demonstration, Tactile cues,  Verbal cues, and Handouts Education comprehension: verbalized understanding, returned demonstration, verbal cues required, tactile cues required, and needs further education  HOME EXERCISE PROGRAM:  Access Code: [email or web address] URL: [email or web address]  ASSESSMENT:  CLINICAL IMPRESSION: Pt with increase in stiffness of the L knee today that is likely due to increase in activity and cold weather. Pt reports signficant relief of tightness following session. Edu provided and questions addressed regarding safety with kneeling. Pt advised that kneeling is up to her tolerance and MD discretion but there is no biomechanical limitation for kneeling after TKA. However, she should not kneel and spin/pivot on that knee at this time. Plan  to continue with strengthen progression of HEP at next. Pt would benefit from continued skilled therapy in order to reach goals and maximize functional L LE strength and ROM for return to PLOF and normal ADL/community mobility.   OBJECTIVE IMPAIRMENTS: Abnormal gait, decreased activity tolerance, decreased mobility, difficulty walking, decreased ROM, decreased strength, increased edema, and pain.   ACTIVITY LIMITATIONS: carrying, lifting, bending, sitting, standing, squatting, sleeping, stairs, transfers, and locomotion level  PARTICIPATION LIMITATIONS: meal prep, cleaning, laundry, driving, shopping, community activity, occupation, and yard work  PERSONAL FACTORS: 1-2 comorbidities: right hip replacement  are also affecting patient's functional outcome.   REHAB POTENTIAL: Excellent  CLINICAL DECISION MAKING: Stable/uncomplicated  EVALUATION COMPLEXITY: Low   GOALS:   SHORT TERM GOALS: 05/06/2023    Pt will become independent with HEP in order to demonstrate synthesis of PT education.  Baseline: Goal status: INITIAL  2.  Pt will be able to demonstrate full L knee TKE in order to demonstrate functional improvement in LE function for self-care and house hold duties.  Baseline:  Goal status: INITIAL  3.  Pt will be able to demonstrate normal walking gait without SPC in clinic distances in order to demonstrate functional improvement in LE function for self-care and house hold duties.  Baseline:  Goal status: INITIAL  4.  Pt will score at least 12 pt increase on FOTO to demonstrate functional improvement in MCII and pt perceived function.   Baseline:  Goal status: INITIAL  LONG TERM GOALS: 06/17/2023   Pt  will become independent with final HEP in order to demonstrate synthesis of PT education.  Baseline:  Goal status: INITIAL  2. Pt will score >/= 66 on FOTO to demonstrate improvement in perceived L LE function.  Baseline:  Goal status: INITIAL  3. Pt will be able  to demonstrate full reciprocal stair pattern with single UE in order to demonstrate functional improvement in LE function for community and household mobility.  Baseline:  Goal status: INITIAL  4.  Pt will be able to demonstrate/report ability to walk >30 mins without pain in order to demonstrate functional improvement and tolerance to exercise and community mobility.  Baseline:  Goal status: INITIAL  PLAN:  PT FREQUENCY: 1-2x/week  PT DURATION: 12 weeks  PLANNED INTERVENTIONS: Therapeutic exercises, Therapeutic activity, Neuromuscular re-education, Balance training, Gait training, Patient/Family education, Self Care, Joint mobilization, Stair training, DME instructions, Aquatic Therapy, Cryotherapy, Moist heat, Taping, and Manual therapy, Dry Needling  PLAN FOR NEXT SESSION:  progress balance, how did gym program go?  Zebedee Iba PT, DPT 04/29/23 11:49 AM

## 2023-04-30 ENCOUNTER — Other Ambulatory Visit (HOSPITAL_BASED_OUTPATIENT_CLINIC_OR_DEPARTMENT_OTHER): Payer: Self-pay

## 2023-04-30 MED ORDER — ONDANSETRON 4 MG PO TBDP: 4.0000 mg | ORAL_TABLET | Freq: Every day | ORAL | 1 refills | Status: DC | PRN

## 2023-04-30 MED ORDER — CLOBETASOL PROPIONATE 0.05 % EX GEL
CUTANEOUS | 4 refills | Status: DC
  Filled 2023-08-20: qty 60, 60d supply, fill #0

## 2023-05-03 ENCOUNTER — Encounter (HOSPITAL_BASED_OUTPATIENT_CLINIC_OR_DEPARTMENT_OTHER): Payer: Self-pay | Admitting: Physical Therapy

## 2023-05-03 ENCOUNTER — Ambulatory Visit (HOSPITAL_BASED_OUTPATIENT_CLINIC_OR_DEPARTMENT_OTHER): Payer: Medicare Other | Admitting: Physical Therapy

## 2023-05-03 DIAGNOSIS — M25562 Pain in left knee: Secondary | ICD-10-CM

## 2023-05-03 DIAGNOSIS — R262 Difficulty in walking, not elsewhere classified: Secondary | ICD-10-CM

## 2023-05-03 DIAGNOSIS — R600 Localized edema: Secondary | ICD-10-CM

## 2023-05-03 DIAGNOSIS — M6281 Muscle weakness (generalized): Secondary | ICD-10-CM

## 2023-05-03 NOTE — Therapy (Signed)
OUTPATIENT PHYSICAL THERAPY LOWER EXTREMITY TREATMENT   Patient Name: Diane Giles MRN: [id number] DOB:October 08, 1955, 67 y.o., female Today's Date: 05/03/2023  END OF SESSION:  PT End of Session - 05/03/23 1059     Visit Number 7    Number of Visits 18    Date for PT Re-Evaluation 06/23/23    Authorization Type MCR    PT Start Time 1100    PT Stop Time 1140    PT Time Calculation (min) 40 min    Activity Tolerance Patient tolerated treatment well    Behavior During Therapy [facility] for tasks assessed/performed                 Past Medical History:  Diagnosis Date   Aortic stenosis    mild-moderate AS 04/22/19 echo   Aortoiliac occlusive disease (HCC) 12/03/2016   Arthritis    Diabetes mellitus without complication (HCC)    pt states she has been told she is no longer diabetic and is not taking meds   Diverticulitis    Gout    Heart murmur    Hyperlipidemia    Hypertension    Osteoporosis    Sleep apnea    Tobacco use disorder 07/04/2012   Past Surgical History:  Procedure Laterality Date   ABDOMINAL AORTOGRAM W/LOWER EXTREMITY N/A 12/03/2016   Procedure: Abdominal Aortogram w/Lower Extremity;  Surgeon: Chuck Hint, MD;  Location: [facility] INVASIVE CV LAB;  Service: Cardiovascular;  Laterality: N/A;   AORTA -INNOMIATE BYPASS N/A 05/18/2019   Procedure: AORTA -INNOMIATE BYPASS Using Hemashield Gold Graft Size 8mm;  Surgeon: Alleen Borne, MD;  Location: [facility] OR;  Service: Open Heart Surgery;  Laterality: N/A;   aorto- fem bypass Bilateral    aortobiiliac bypass in 2000   DILATATION & CURETTAGE/HYSTEROSCOPY WITH MYOSURE N/A 12/27/2015   Procedure: DILATATION & CURETTAGE/HYSTEROSCOPY;  Surgeon: Patton Salles, MD;  Location: [facility] ORS;  Service: Gynecology;  Laterality: N/A;   DILATION AND CURETTAGE OF UTERUS  11/2015   ELBOW SURGERY Left ~2007   tendon repair by Dr. Ranell Patrick   EYE SURGERY Bilateral 2008   lasik   JOINT REPLACEMENT     lower aortic  bypass  2000   per patient   PERIPHERAL VASCULAR INTERVENTION  12/03/2016   Stenting of anastomotic stenosis of the aortoiliac and right common iliac artery with 7 x 39 mm VBX covered stent and postdilated with 9 mm balloon.   Right innonimate bypass  05/18/2019   Aorto innominate bypass surgery with median sternotomy   STERNOTOMY  05/18/2019   Right aorto-innonimate bypass surgery 05/18/19 Dr. Laneta Simmers   TOTAL HIP ARTHROPLASTY Right 07/27/2014   Procedure: RIGHT TOTAL HIP ARTHROPLASTY ANTERIOR APPROACH;  Surgeon: Durene Romans, MD;  Location: [facility] ORS;  Service: Orthopedics;  Laterality: Right;   TOTAL KNEE ARTHROPLASTY Right 05/28/2022   Procedure: RIGHT TOTAL KNEE ARTHROPLASTY;  Surgeon: Tarry Kos, MD;  Location: [facility] OR;  Service: Orthopedics;  Laterality: Right;   TOTAL KNEE ARTHROPLASTY Left 03/04/2023   Procedure: TOTAL KNEE ARTHROPLASTY;  Surgeon: Tarry Kos, MD;  Location: [facility] OR;  Service: Orthopedics;  Laterality: Left;   Patient Active Problem List   Diagnosis Date Noted   Status post total left knee replacement 03/04/2023   Primary osteoarthritis of left knee 07/10/2022   Status post total right knee replacement 05/28/2022   Vitamin B12 deficiency 10/31/2021   Small bowel obstruction (HCC)    Partial small bowel obstruction (HCC) 10/28/2021   PAD (  peripheral artery disease) (HCC)    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   Aortoiliac occlusive disease (HCC) 12/03/2016   Centrilobular emphysema (HCC) 10/03/2016   Right groin pain 02/27/2016   Sacroiliitis (HCC) 02/27/2016   Postmenopausal bleeding 12/01/2015   Psoas tendinitis of right side 10/21/2015   Endometrial thickening on ultrasound 07/28/2015   S/P right THA, 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   Type 2 diabetes mellitus (HCC) 05/18/2014   DDD (degenerative disc disease) 03/18/2013   Metabolic syndrome 07/29/2012   Essential hypertension, benign 07/18/2012   Hyperlipidemia 07/18/2012   Obstructive sleep apnea  07/04/2012   Osteoporosis 07/04/2012   Morbid obesity (HCC) 07/04/2012    PCP: Lula Olszewski MD   REFERRING PROVIDER: Neomia Dear PA   REFERRING DIAG: Right TKA  THERAPY DIAG:  Left knee pain, unspecified chronicity  Difficulty walking  Muscle weakness (generalized)  Localized edema  Rationale for Evaluation and Treatment: Rehabilitation  ONSET DATE: 10/28 L TKA  Days since surgery: 60   SUBJECTIVE:   SUBJECTIVE STATEMENT:  Pt states that the knee is stiff this morning. There is no pain from last session. There is some mild soreness.   Eval:  Pt states she fell on Thursday 11/14 after seeing MD for L knee TKA. She does not recall a LOB or LOC but she just remembers landing on the L knee on a bathmat. Pt states the swelling does not seem to going away. There is a sharp/stinging in the groin. Direct pressure will cause pain into the groin and HS. Pt reports landing on the knees and fell fwd. She did hit her head and did not have LOC of concussion symptoms. Pt did not skin/cut the the L knee. Denies s/s on infection.    Pt has had 2 wks of HHPT. Pt did very well with HHPT and was immediately able to bend up to 115 of flexion and -2 of ext.   PERTINENT HISTORY: Aortic stenosis; Pre-diabetic,  Gout ( hasn't had an attack in 1.5 years) Osteopetrosis,  R THA,  Left ebow surgery 2007; R TKA PAIN:  Are you having pain? No: NPRS scale: 0/10 Pain location: L knee  Pain description: aching  Aggravating factors: movement Relieving factors: rest   PRECAUTIONS: None  WEIGHT BEARING RESTRICTIONS: No  FALLS:  Has patient fallen in last 6 months? No  LIVING ENVIRONMENT: 15  going to the second floor. 2 steps going in.   OCCUPATION:  Retired  Presenter, broadcasting: exercise; [employer] member   PLOF: Independent  PATIENT GOALS:  to get back into her normal routine  OBJECTIVE:   DIAGNOSTIC FINDINGS: 10/28  IMPRESSION: Interval total left knee arthroplasty without evidence of  hardware failure.  PATIENT SURVEYS:  FOTO 42 66@ DC 12 pts MCII   COGNITION: Overall cognitive status: Within functional limits for tasks assessed     SENSATION: WFL  EDEMA:  Circumferential: 42.5 cm vs 38 cm on R   MUSCLE LENGTH: limited adductor length   POSTURE: No Significant postural limitations  PALPATION: TTP of L quad, adductor, and patellar borders  Wound clean and dry with steri strips in place  LOWER EXTREMITY ROM:  Passive ROM L eval R eval  Hip flexion    Hip extension    Hip abduction    Hip adduction    Hip internal rotation    Hip external rotation    Knee flexion 121 127  Knee extension -4 0  Ankle dorsiflexion    Ankle plantarflexion    Ankle inversion  Ankle eversion     (Blank rows = not tested)  LOWER EXTREMITY MMT: no indicated 2/2    FUNCTIONAL TESTS:  UE required for STS    GAIT: Using single point cane; decreased L stance time, lack of full TKE, mildly antalgic  TODAY'S TREATMENT:                                                                                                                              DATE:   12/27  Recumbent bike warm up Lvl 1 6 min  Shuttle leg press 62 lbs 3x10 (single leg)  8" step up 3x8 Standing lunge at counter 2x10 each NBOS EC on foam 30s 3x; attempted tandem 3x but unable get get >20s Supine marching bridge 3x10    12/23  Flexion 122, 1 deg hyper after manual  L TKE joint mob grade III, tibial ER Patellar mob in all directions grade III STM L quad (VL and rec fem)  Self stiffness management, safety with kneeling and kneel to stand, massage therapy options  12/19  Flexion 124, Ext -3 to start ; 1 deg hyper after manual  L TKE joint mob grade III, tibial ER Patellar mob in all directions grade III  SLR 2lbs 3x10 (Focus on TKE) LAQ 5lbs 3x10 SLS 30s 3x STS 3x8     12/16 Manual: reviewed patella ROM   SLR 3x10 1.5 lbs  SAQ 3x10 1.5 lbs    Hip abduction 3x12 70 lbs   Leg press 85 lbs 3x15    Lateral  step 2x10 6 inch  Front step up 2x10 6 inch      Treatment                            12/11:  Fwd & lateral step ups Heel raises & stretch on slant board Lateral stepping Walk through gym- added xride and removed leg ext/flexion machines Taichi/yoga discussion   11/25   Testing of the knee and edu of exam findings: TTP of L tibial plateau, TTP of posterior knee, hypermobility of L patella medial and laterally, mild increase in varus/valgus motion  41 cm suprapatellar edema measurement  Self pain management, appropriate HEP, returning to MD  Eval:   Program Notes Ice 2-3x 10-15 have leg proppedContinue with standing exercises from Home Health  Exercises - Supine Hip Adduction Isometric with Ball  - 2 x daily - 7 x weekly - 2 sets - 10 reps - 5 hold - Supine Bridge  - 2 x daily - 7 x weekly - 2 sets - 10 reps - Lateral Lunge Adductor Stretch with Counter Support  - 2 x daily - 7 x weekly - 1 sets - 3 reps - 30 hold - Seated Long Arc Quad  - 2 x daily - 7 x weekly - 3 sets - 10 reps - Long Sitting Quad Set with Towel Roll Under  Heel  - 2 x daily - 7 x weekly - 2 sets - 10 reps - 3 hold   PATIENT EDUCATION:  Education details: exam findings, anatomy, exercise progression, DOMS expectations, muscle firing,  envelope of function, HEP, POC Person educated: Patient Education method: Explanation, Demonstration, Tactile cues, Verbal cues, and Handouts Education comprehension: verbalized understanding, returned demonstration, verbal cues required, tactile cues required, and needs further education  HOME EXERCISE PROGRAM:  Access Code: [email or web address] URL: [email or web address]  ASSESSMENT:  CLINICAL IMPRESSION: HEP progressed today as pt is able to perform more standing balance and SL stability exercise. Pt did have 1 instance of buckling of the R knee with a fwd lunge that resolved as she warmed up with the movement. Pt's largest  limitation is weakness of the LE at this time. Pt did tolerate exercise session without pain. ROM still maintained. Plan to continue with strengthen progression of HEP at next. Pt would benefit from continued skilled therapy in order to reach goals and maximize functional L LE strength and ROM for return to PLOF and normal ADL/community mobility.   OBJECTIVE IMPAIRMENTS: Abnormal gait, decreased activity tolerance, decreased mobility, difficulty walking, decreased ROM, decreased strength, increased edema, and pain.   ACTIVITY LIMITATIONS: carrying, lifting, bending, sitting, standing, squatting, sleeping, stairs, transfers, and locomotion level  PARTICIPATION LIMITATIONS: meal prep, cleaning, laundry, driving, shopping, community activity, occupation, and yard work  PERSONAL FACTORS: 1-2 comorbidities: right hip replacement  are also affecting patient's functional outcome.   REHAB POTENTIAL: Excellent  CLINICAL DECISION MAKING: Stable/uncomplicated  EVALUATION COMPLEXITY: Low   GOALS:   SHORT TERM GOALS: 05/06/2023    Pt will become independent with HEP in order to demonstrate synthesis of PT education.  Baseline: Goal status: MET  2.  Pt will be able to demonstrate full L knee TKE in order to demonstrate functional improvement in LE function for self-care and house hold duties.  Baseline:  Goal status: MET  3.  Pt will be able to demonstrate normal walking gait without SPC in clinic distances in order to demonstrate functional improvement in LE function for self-care and house hold duties.  Baseline:  Goal status: MET  4.  Pt will score at least 12 pt increase on FOTO to demonstrate functional improvement in MCII and pt perceived function.   Baseline:  Goal status: ongoing  LONG TERM GOALS: 06/17/2023   Pt  will become independent with final HEP in order to demonstrate synthesis of PT education.  Baseline:  Goal status: INITIAL  2. Pt will score >/= 66 on FOTO to  demonstrate improvement in perceived L LE function.  Baseline:  Goal status: INITIAL  3. Pt will be able to demonstrate full reciprocal stair pattern with single UE in order to demonstrate functional improvement in LE function for community and household mobility.  Baseline:  Goal status: INITIAL  4.  Pt will be able to demonstrate/report ability to walk >30 mins without pain in order to demonstrate functional improvement and tolerance to exercise and community mobility.  Baseline:  Goal status: INITIAL  PLAN:  PT FREQUENCY: 1-2x/week  PT DURATION: 12 weeks  PLANNED INTERVENTIONS: Therapeutic exercises, Therapeutic activity, Neuromuscular re-education, Balance training, Gait training, Patient/Family education, Self Care, Joint mobilization, Stair training, DME instructions, Aquatic Therapy, Cryotherapy, Moist heat, Taping, and Manual therapy, Dry Needling  PLAN FOR NEXT SESSION:  progress balance, how did gym program go?  Zebedee Iba PT, DPT 05/03/23 11:50 AM

## 2023-05-06 ENCOUNTER — Other Ambulatory Visit (HOSPITAL_COMMUNITY): Payer: Self-pay

## 2023-05-06 ENCOUNTER — Other Ambulatory Visit: Payer: Self-pay

## 2023-05-06 MED ORDER — ATORVASTATIN CALCIUM 80 MG PO TABS
80.0000 mg | ORAL_TABLET | Freq: Every day | ORAL | 4 refills | Status: DC
  Filled 2023-05-06: qty 90, fill #0
  Filled 2023-05-07: qty 90, 90d supply, fill #0
  Filled 2023-05-07: qty 30, 30d supply, fill #0
  Filled 2023-05-27 – 2023-06-04 (×2): qty 30, 30d supply, fill #1
  Filled 2023-06-20 – 2023-06-26 (×2): qty 30, 30d supply, fill #2
  Filled 2023-07-11 – 2023-07-24 (×3): qty 30, 30d supply, fill #3
  Filled 2023-08-12 – 2023-09-10 (×6): qty 30, 30d supply, fill #4
  Filled ????-??-??: fill #3
  Filled ????-??-??: fill #4

## 2023-05-06 MED FILL — Ezetimibe Tab 10 MG: ORAL | 90 days supply | Qty: 90 | Fill #0 | Status: CN

## 2023-05-07 ENCOUNTER — Other Ambulatory Visit: Payer: Self-pay

## 2023-05-07 ENCOUNTER — Other Ambulatory Visit (HOSPITAL_BASED_OUTPATIENT_CLINIC_OR_DEPARTMENT_OTHER): Payer: Self-pay

## 2023-05-07 ENCOUNTER — Other Ambulatory Visit (HOSPITAL_COMMUNITY): Payer: Self-pay

## 2023-05-07 MED FILL — Ezetimibe Tab 10 MG: ORAL | 30 days supply | Qty: 30 | Fill #0 | Status: AC

## 2023-05-07 MED FILL — Ezetimibe Tab 10 MG: ORAL | 90 days supply | Qty: 90 | Fill #0 | Status: CN

## 2023-05-09 ENCOUNTER — Other Ambulatory Visit: Payer: Self-pay

## 2023-05-09 ENCOUNTER — Other Ambulatory Visit (HOSPITAL_BASED_OUTPATIENT_CLINIC_OR_DEPARTMENT_OTHER): Payer: Self-pay

## 2023-05-09 MED ORDER — LOSARTAN POTASSIUM 50 MG PO TABS
50.0000 mg | ORAL_TABLET | Freq: Every day | ORAL | 0 refills | Status: DC
  Filled 2023-05-09: qty 30, 30d supply, fill #0

## 2023-05-10 ENCOUNTER — Other Ambulatory Visit (HOSPITAL_BASED_OUTPATIENT_CLINIC_OR_DEPARTMENT_OTHER): Payer: Self-pay

## 2023-05-10 ENCOUNTER — Other Ambulatory Visit: Payer: Self-pay

## 2023-05-15 ENCOUNTER — Ambulatory Visit (HOSPITAL_BASED_OUTPATIENT_CLINIC_OR_DEPARTMENT_OTHER): Payer: Medicare Other | Admitting: Orthopaedic Surgery

## 2023-05-15 ENCOUNTER — Other Ambulatory Visit: Payer: Self-pay

## 2023-05-15 ENCOUNTER — Encounter (HOSPITAL_BASED_OUTPATIENT_CLINIC_OR_DEPARTMENT_OTHER): Payer: Medicare Other | Admitting: Physical Therapy

## 2023-05-15 DIAGNOSIS — Z96652 Presence of left artificial knee joint: Secondary | ICD-10-CM | POA: Diagnosis not present

## 2023-05-15 NOTE — Progress Notes (Signed)
 Chief Complaint: Leg weakness     History of Present Illness:   05/15/2023; presents today for discussion of her back as well as her lower extremity weakness which is now much improved following her bilateral knee replacements  Diane Giles is a 68 y.o. female with history of right knee pain now going on for approximately 15 years.  She states that the knee feels sore and achy all over.  She states that she attempts to be active and is walking more than a mile a day at the gym at droppage although it starts to buckle and be painful after approximately more than 1 month.  She states that she has significant difficulty going from sit to stand.  She has pain even while at rest while sitting.  She is working significantly on trying to lose weight that she did gain from COVID.  She has lost 25 pounds recently.  She has previously had injections at [facility] which were many years prior.  These did not provide any type of significant relief.  She has taken over-the-counter anti-inflammatories with minimal relief as well.  She is here today seeking a more definitive solution.  She does have a history of diabetes with a last A1c of 6.3    Surgical History:   History of total hip arthroplasty done at [facility] many years prior  PMH/PSH/Family History/Social History/Meds/Allergies:    Past Medical History:  Diagnosis Date   Aortic stenosis    mild-moderate AS 04/22/19 echo   Aortoiliac occlusive disease (HCC) 12/03/2016   Arthritis    Diabetes mellitus without complication (HCC)    pt states she has been told she is no longer diabetic and is not taking meds   Diverticulitis    Gout    Heart murmur    Hyperlipidemia    Hypertension    Osteoporosis    Sleep apnea    Tobacco use disorder 07/04/2012   Past Surgical History:  Procedure Laterality Date   ABDOMINAL AORTOGRAM W/LOWER EXTREMITY N/A 12/03/2016   Procedure: Abdominal Aortogram w/Lower  Extremity;  Surgeon: Eliza Lonni RAMAN, MD;  Location: [facility] INVASIVE CV LAB;  Service: Cardiovascular;  Laterality: N/A;   AORTA -INNOMIATE BYPASS N/A 05/18/2019   Procedure: AORTA -INNOMIATE BYPASS Using Hemashield Gold Graft Size 8mm;  Surgeon: Lucas Dorise POUR, MD;  Location: [facility] OR;  Service: Open Heart Surgery;  Laterality: N/A;   aorto- fem bypass Bilateral    aortobiiliac bypass in 2000   DILATATION & CURETTAGE/HYSTEROSCOPY WITH MYOSURE N/A 12/27/2015   Procedure: DILATATION & CURETTAGE/HYSTEROSCOPY;  Surgeon: Bobie FORBES Cathlyn JAYSON Nikki, MD;  Location: [facility];  Service: Gynecology;  Laterality: N/A;   DILATION AND CURETTAGE OF UTERUS  11/2015   ELBOW SURGERY Left ~2007   tendon repair by Dr. Kay   EYE SURGERY Bilateral 2008   lasik   JOINT REPLACEMENT     lower aortic bypass  2000   per patient   PERIPHERAL VASCULAR INTERVENTION  12/03/2016   Stenting of anastomotic stenosis of the aortoiliac and right common iliac artery with 7 x 39 mm VBX covered stent and postdilated with 9 mm balloon.   Right innonimate bypass  05/18/2019   Aorto innominate bypass surgery with median sternotomy   STERNOTOMY  05/18/2019   Right aorto-innonimate bypass surgery 05/18/19 Dr. Lucas  TOTAL HIP ARTHROPLASTY Right 07/27/2014   Procedure: RIGHT TOTAL HIP ARTHROPLASTY ANTERIOR APPROACH;  Surgeon: Donnice Car, MD;  Location: [facility];  Service: Orthopedics;  Laterality: Right;   TOTAL KNEE ARTHROPLASTY Right 05/28/2022   Procedure: RIGHT TOTAL KNEE ARTHROPLASTY;  Surgeon: Jerri Kay HERO, MD;  Location: [facility];  Service: Orthopedics;  Laterality: Right;   TOTAL KNEE ARTHROPLASTY Left 03/04/2023   Procedure: TOTAL KNEE ARTHROPLASTY;  Surgeon: Jerri Kay HERO, MD;  Location: [facility] OR;  Service: Orthopedics;  Laterality: Left;   Social History   Socioeconomic History   Marital status: Married    Spouse name: Not on file   Number of children: 5   Years of education: Not on file   Highest education level: Not  on file  Occupational History   Occupation: Tree Surgeon  Tobacco Use   Smoking status: Former    Current packs/day: 0.00    Average packs/day: 0.5 packs/day for 40.0 years (20.0 ttl pk-yrs)    Types: Cigarettes    Start date: 02/23/1977    Quit date: 02/23/2017    Years since quitting: 6.2   Smokeless tobacco: Never  Vaping Use   Vaping status: Never Used  Substance and Sexual Activity   Alcohol use: Yes    Comment: very rare--maybe once a year   Drug use: No   Sexual activity: Yes    Partners: Male    Birth control/protection: Post-menopausal  Other Topics Concern   Not on file  Social History Narrative   Married. Patient does not exercise. She has a Geographical information systems officer.   Right handed   Lives with husband in two story home   Social Drivers of Health   Financial Resource Strain: Not on file  Food Insecurity: No Food Insecurity (03/04/2023)   Hunger Vital Sign    Worried About Running Out of Food in the Last Year: Never true    Ran Out of Food in the Last Year: Never true  Transportation Needs: No Transportation Needs (03/04/2023)   PRAPARE - Administrator, Civil Service (Medical): No    Lack of Transportation (Non-Medical): No  Physical Activity: Not on file  Stress: Not on file  Social Connections: Unknown (09/19/2021)   [facility], [facility]   Social Network    Social Network: Not on file   Family History  Problem Relation Age of Onset   Migraines Mother    Thyroid  disease Mother        hypothyroid   Cancer Father 69       Dec age 59 with pancreatic Ca   Allergies  Allergen Reactions   Codeine Itching   Current Outpatient Medications  Medication Sig Dispense Refill   allopurinol  (ZYLOPRIM ) 100 MG tablet Take 100 mg by mouth in the morning.     allopurinol  (ZYLOPRIM ) 100 MG tablet Take 1 tablet (100 mg total) by mouth daily for uric acid/gout 90 tablet 4   aspirin  EC 81 MG tablet Take 1 tablet (81 mg total) by mouth 2  (two) times daily. To be taken after surgery 84 tablet 0   atenolol  (TENORMIN ) 25 MG tablet Take 1 tablet (25 mg total) by mouth daily. 90 tablet 4   atenolol  (TENORMIN ) 50 MG tablet Take 25 mg by mouth in the morning.     atorvastatin  (LIPITOR ) 80 MG tablet Take 1 tablet (80 mg total) by mouth daily at 6 PM. 90 tablet 3   atorvastatin  (LIPITOR ) 80 MG tablet Take 1  tablet (80 mg total) by mouth daily at 6 PM. 90 tablet 4   buPROPion  (WELLBUTRIN  XL) 150 MG 24 hr tablet Take 150 mg by mouth every morning.     buPROPion  (WELLBUTRIN  XL) 150 MG 24 hr tablet Take 1 tablet (150 mg total) by mouth in the morning for depression/anxiety 90 tablet 3   CALCIUM  600 1500 (600 Ca) MG TABS tablet Take 1 tablet by mouth daily.     Calcium  Citrate-Vitamin D  (CALCIUM  CITRATE +) 315-5 MG-MCG TABS Take 1 tablet by mouth daily. 120 tablet 99   cholecalciferol  (VITAMIN D3) 25 MCG (1000 UNIT) tablet Take 1 tablet (25 mcg total) by mouth every morning. 90 tablet 4   Cholecalciferol  (VITAMIN D3) 250 MCG (10000 UT) capsule Take 10,000 Units by mouth daily.     clobetasol  (TEMOVATE ) 0.05 % GEL Apply 1 Application topically daily.     clobetasol  (TEMOVATE ) 0.05 % GEL Apply to vaginal area once daily 90 g 4   ezetimibe  (ZETIA ) 10 MG tablet Take 1 tablet (10 mg total) by mouth daily. 90 tablet 3   Lancets MISC Use as directed once a day. (DX: R73.9) 100 each 2   losartan  (COZAAR ) 50 MG tablet Take 50 mg by mouth daily.     losartan  (COZAAR ) 50 MG tablet Take 1 tablet (50 mg total) by mouth daily for blood pressure. If running above 130/80, increase to 2 tablets daily as directed 90 tablet 4   losartan  (COZAAR ) 50 MG tablet Take 1 tablet (50 mg total) by mouth daily for blood pressure. Take additional 50 mg tab along with your regualr tab in pill pack for a total of 100 mg daily. 30 tablet 0   magnesium  oxide (MAG-OX) 400 (240 Mg) MG tablet Take 400 mg by mouth daily.     magnesium  oxide (MAG-OX) 400 MG tablet Take 1 tablet (400  mg total) by mouth daily. 90 tablet 3   methocarbamol  (ROBAXIN -750) 750 MG tablet Take 1 tablet (750 mg total) by mouth 2 (two) times daily as needed for muscle spasms. 20 tablet 2   ondansetron  (ZOFRAN ) 4 MG tablet Take 1 tablet (4 mg total) by mouth daily as needed for nausea or vomiting. (Patient not taking: Reported on 03/05/2023) 30 tablet 1   ondansetron  (ZOFRAN -ODT) 4 MG disintegrating tablet Take 1 tablet (4 mg total) by mouth daily as needed for nausea and vomiting 30 tablet 1   oxyCODONE -acetaminophen  (PERCOCET) 5-325 MG tablet Take 1-2 tablets by mouth every 8 (eight) hours as needed. 40 tablet 0   OZEMPIC, 0.25 OR 0.5 MG/DOSE, 2 MG/3ML SOPN Inject 0.5 mg into the skin every Sunday.     pregabalin  (LYRICA ) 75 MG capsule Take 75 mg by mouth 2 (two) times daily.     pregabalin  (LYRICA ) 75 MG capsule Take 1 capsule (75 mg total) by mouth 2 (two) times daily. 180 capsule 2   senna-docusate (SENOKOT-S) 8.6-50 MG tablet Take 1 tablet by mouth daily. 15 tablet 0   traMADol  (ULTRAM ) 50 MG tablet Take 1-2 tablets (50-100 mg total) by mouth 2 (two) times daily as needed. 30 tablet 0   No current facility-administered medications for this visit.   No results found.  Review of Systems:   A ROS was performed including pertinent positives and negatives as documented in the HPI.  Physical Exam :   Constitutional: NAD and appears stated age Neurological: Alert and oriented Psych: Appropriate affect and cooperative Last menstrual period 05/07/2005.   Comprehensive Musculoskeletal Exam:  Musculoskeletal Exam  Gait Normal  Alignment Normal   Right Left  Inspection Normal Normal  Palpation    Tenderness tricompartmental none  Crepitus positive None  Effusion Mild None  Range of Motion    Extension 0 0  Flexion 135 135  Strength    Extension 5/5 5/5  Flexion 5/5 5/5  Ligament Exam     Generalized Laxity No No  Lachman Negative Negative   Pivot Shift Negative Negative  Anterior  Drawer Negative Negative  Valgus at 0 Negative Negative  Valgus at 20 Negative Negative  Varus at 0 0 0  Varus at 20   0 0  Posterior Drawer at 90 0 0  Vascular/Lymphatic Exam    Edema None None  Venous Stasis Changes No No  Distal Circulation Normal Normal  Neurologic    Light Touch Sensation Intact Intact  Special Tests:    Positive straight leg raise that is worse with forward flexion.  Imaging:   Xray (4 views right knee): Moderate to severe tricompartmental osteoarthritis  MRI, cervical thoracic and lumbar spine: She does appear to have a moderate disc herniation involving the L5-S1 disc space  I personally reviewed and interpreted the radiographs.   Assessment:   68 year old female with L5-S1 disc herniation.  Overall she is doing much better with much improved gait after knee replacement.  At this time I will plan to see her back as needed  Plan :    -Return to clinic as needed     I personally saw and evaluated the patient, and participated in the management and treatment plan.  Elspeth Parker, MD Attending Physician, Orthopedic Surgery  This document was dictated using Dragon voice recognition software. A reasonable attempt at proof reading has been made to minimize errors.

## 2023-05-16 ENCOUNTER — Other Ambulatory Visit (HOSPITAL_BASED_OUTPATIENT_CLINIC_OR_DEPARTMENT_OTHER): Payer: Self-pay

## 2023-05-16 MED ORDER — MAGNESIUM OXIDE 400 MG PO TABS
400.0000 mg | ORAL_TABLET | Freq: Every evening | ORAL | 4 refills | Status: DC
  Filled 2023-05-16 – 2023-07-22 (×2): qty 90, 90d supply, fill #0
  Filled 2023-07-24: qty 30, 30d supply, fill #0
  Filled 2023-08-12 – 2023-09-10 (×6): qty 30, 30d supply, fill #1
  Filled ????-??-??: fill #1
  Filled ????-??-?? (×2): fill #0

## 2023-05-27 ENCOUNTER — Ambulatory Visit (HOSPITAL_BASED_OUTPATIENT_CLINIC_OR_DEPARTMENT_OTHER): Payer: Medicare Other | Attending: Orthopaedic Surgery | Admitting: Physical Therapy

## 2023-05-27 ENCOUNTER — Other Ambulatory Visit (HOSPITAL_BASED_OUTPATIENT_CLINIC_OR_DEPARTMENT_OTHER): Payer: Self-pay

## 2023-05-27 ENCOUNTER — Encounter (HOSPITAL_BASED_OUTPATIENT_CLINIC_OR_DEPARTMENT_OTHER): Payer: Self-pay | Admitting: Physical Therapy

## 2023-05-27 ENCOUNTER — Other Ambulatory Visit: Payer: Self-pay

## 2023-05-27 DIAGNOSIS — R262 Difficulty in walking, not elsewhere classified: Secondary | ICD-10-CM | POA: Insufficient documentation

## 2023-05-27 DIAGNOSIS — M25562 Pain in left knee: Secondary | ICD-10-CM | POA: Insufficient documentation

## 2023-05-27 MED FILL — Ezetimibe Tab 10 MG: ORAL | 30 days supply | Qty: 30 | Fill #1 | Status: CN

## 2023-05-27 NOTE — Therapy (Signed)
OUTPATIENT PHYSICAL THERAPY LOWER EXTREMITY TREATMENT   Patient Name: Diane Giles MRN: [id number] DOB:03-09-1956, 68 y.o., female Today's Date: 05/27/2023  END OF SESSION:  PT End of Session - 05/27/23 1223     Visit Number 8    Number of Visits 18    Date for PT Re-Evaluation 06/23/23    Authorization Type MCR    PT Start Time 1148    PT Stop Time 1229    PT Time Calculation (min) 41 min    Activity Tolerance Patient tolerated treatment well    Behavior During Therapy [facility] for tasks assessed/performed                  Past Medical History:  Diagnosis Date   Aortic stenosis    mild-moderate AS 04/22/19 echo   Aortoiliac occlusive disease (HCC) 12/03/2016   Arthritis    Diabetes mellitus without complication (HCC)    pt states she has been told she is no longer diabetic and is not taking meds   Diverticulitis    Gout    Heart murmur    Hyperlipidemia    Hypertension    Osteoporosis    Sleep apnea    Tobacco use disorder 07/04/2012   Past Surgical History:  Procedure Laterality Date   ABDOMINAL AORTOGRAM W/LOWER EXTREMITY N/A 12/03/2016   Procedure: Abdominal Aortogram w/Lower Extremity;  Surgeon: Chuck Hint, MD;  Location: [facility] INVASIVE CV LAB;  Service: Cardiovascular;  Laterality: N/A;   AORTA -INNOMIATE BYPASS N/A 05/18/2019   Procedure: AORTA -INNOMIATE BYPASS Using Hemashield Gold Graft Size 8mm;  Surgeon: Alleen Borne, MD;  Location: [facility] OR;  Service: Open Heart Surgery;  Laterality: N/A;   aorto- fem bypass Bilateral    aortobiiliac bypass in 2000   DILATATION & CURETTAGE/HYSTEROSCOPY WITH MYOSURE N/A 12/27/2015   Procedure: DILATATION & CURETTAGE/HYSTEROSCOPY;  Surgeon: Patton Salles, MD;  Location: [facility] ORS;  Service: Gynecology;  Laterality: N/A;   DILATION AND CURETTAGE OF UTERUS  11/2015   ELBOW SURGERY Left ~2007   tendon repair by Dr. Ranell Patrick   EYE SURGERY Bilateral 2008   lasik   JOINT REPLACEMENT     lower aortic  bypass  2000   per patient   PERIPHERAL VASCULAR INTERVENTION  12/03/2016   Stenting of anastomotic stenosis of the aortoiliac and right common iliac artery with 7 x 39 mm VBX covered stent and postdilated with 9 mm balloon.   Right innonimate bypass  05/18/2019   Aorto innominate bypass surgery with median sternotomy   STERNOTOMY  05/18/2019   Right aorto-innonimate bypass surgery 05/18/19 Dr. Laneta Simmers   TOTAL HIP ARTHROPLASTY Right 07/27/2014   Procedure: RIGHT TOTAL HIP ARTHROPLASTY ANTERIOR APPROACH;  Surgeon: Durene Romans, MD;  Location: [facility]S;  Service: Orthopedics;  Laterality: Right;   TOTAL KNEE ARTHROPLASTY Right 05/28/2022   Procedure: RIGHT TOTAL KNEE ARTHROPLASTY;  Surgeon: Tarry Kos, MD;  Location: [facility];  Service: Orthopedics;  Laterality: Right;   TOTAL KNEE ARTHROPLASTY Left 03/04/2023   Procedure: TOTAL KNEE ARTHROPLASTY;  Surgeon: Tarry Kos, MD;  Location: [facility] OR;  Service: Orthopedics;  Laterality: Left;   Patient Active Problem List   Diagnosis Date Noted   Status post total left knee replacement 03/04/2023   Primary osteoarthritis of left knee 07/10/2022   Status post total right knee replacement 05/28/2022   Vitamin B12 deficiency 10/31/2021   Small bowel obstruction (HCC)    Partial small bowel obstruction (HCC) 10/28/2021  PAD (peripheral artery disease) (HCC)    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   Aortoiliac occlusive disease (HCC) 12/03/2016   Centrilobular emphysema (HCC) 10/03/2016   Right groin pain 02/27/2016   Sacroiliitis (HCC) 02/27/2016   Postmenopausal bleeding 12/01/2015   Psoas tendinitis of right side 10/21/2015   Endometrial thickening on ultrasound 07/28/2015   S/P right THA, 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   Type 2 diabetes mellitus (HCC) 05/18/2014   DDD (degenerative disc disease) 03/18/2013   Metabolic syndrome 07/29/2012   Essential hypertension, benign 07/18/2012   Hyperlipidemia 07/18/2012   Obstructive sleep apnea  07/04/2012   Osteoporosis 07/04/2012   Morbid obesity (HCC) 07/04/2012    PCP: Lula Olszewski MD   REFERRING PROVIDER: Neomia Dear PA   REFERRING DIAG: Right TKA  THERAPY DIAG:  Left knee pain, unspecified chronicity  Difficulty walking  Rationale for Evaluation and Treatment: Rehabilitation  ONSET DATE: 10/28 L TKA  Days since surgery: 84   SUBJECTIVE:   SUBJECTIVE STATEMENT:  Pt states that the bilat LE weakness is much better. She has less LE weakness. Pt states she only had pain into the knees if it is cold. Pt is aiming for 5x/week in the gym. Pt is back to doing knee ext, HS curls, and leg pressing in addition to HEP. She feels the balance is still difficult. There is some moderate pain into the back of the knee.   Eval:  Pt states she fell on Thursday 11/14 after seeing MD for L knee TKA. She does not recall a LOB or LOC but she just remembers landing on the L knee on a bathmat. Pt states the swelling does not seem to going away. There is a sharp/stinging in the groin. Direct pressure will cause pain into the groin and HS. Pt reports landing on the knees and fell fwd. She did hit her head and did not have LOC of concussion symptoms. Pt did not skin/cut the the L knee. Denies s/s on infection.    Pt has had 2 wks of HHPT. Pt did very well with HHPT and was immediately able to bend up to 115 of flexion and -2 of ext.   PERTINENT HISTORY: Aortic stenosis; Pre-diabetic,  Gout ( hasn't had an attack in 1.5 years) Osteopetrosis,  R THA,  Left ebow surgery 2007; R TKA PAIN:  Are you having pain? No: NPRS scale: 0/10 Pain location: L knee  Pain description: aching  Aggravating factors: movement Relieving factors: rest   PRECAUTIONS: None  WEIGHT BEARING RESTRICTIONS: No  FALLS:  Has patient fallen in last 6 months? No  LIVING ENVIRONMENT: 15  going to the second floor. 2 steps going in.   OCCUPATION:  Retired  Presenter, broadcasting: exercise; [employer] member   PLOF:  Independent  PATIENT GOALS:  to get back into her normal routine  OBJECTIVE:   DIAGNOSTIC FINDINGS: 10/28  IMPRESSION: Interval total left knee arthroplasty without evidence of hardware failure.  PATIENT SURVEYS:  FOTO 42 66@ DC 12 pts MCII   COGNITION: Overall cognitive status: Within functional limits for tasks assessed     SENSATION: WFL  EDEMA:  Circumferential: 39.5cm vs 38 cm on R    LOWER EXTREMITY ROM:  Passive ROM L eval L 1/20 R eval  Hip flexion     Hip extension     Hip abduction     Hip adduction     Hip internal rotation     Hip external rotation     Knee flexion 121 120 127  Knee extension -4 1 hyper 0  Ankle dorsiflexion     Ankle plantarflexion     Ankle inversion     Ankle eversion      (Blank rows = not tested)  LOWER EXTREMITY MMT:  MMT Right 1/20 Left 1/20  Hip flexion 4+/5 4+/5  Hip extension    Hip abduction    Hip adduction    Hip internal rotation    Hip external rotation    Knee flexion 4+/5 4/5  Knee extension 5/5 4+/5  Ankle dorsiflexion    Ankle plantarflexion    Ankle inversion    Ankle eversion     (Blank rows = not tested)    FUNCTIONAL TESTS:  STS without UE now   GAIT: ack of full TKE, shortened step length bilat  TODAY'S TREATMENT:                                                                                                                              DATE:   1/20  120 flexion; 0 deg of ext   STM L HS; edema sweeping popliteal space  8" runners step up with UE support 2x10 Airex SLS 30s 4x Lateral step up on foam to SLS 2x10     12/27  Recumbent bike warm up Lvl 1 6 min  Shuttle leg press 62 lbs 3x10 (single leg)  8" step up 3x8 Standing lunge at counter 2x10 each NBOS EC on foam 30s 3x; attempted tandem 3x but unable get get >20s Supine marching bridge 3x10    12/23  Flexion 122, 1 deg hyper after manual  L TKE joint mob grade III, tibial ER Patellar mob in all directions  grade III STM L quad (VL and rec fem)  Self stiffness management, safety with kneeling and kneel to stand, massage therapy options  12/19  Flexion 124, Ext -3 to start ; 1 deg hyper after manual  L TKE joint mob grade III, tibial ER Patellar mob in all directions grade III  SLR 2lbs 3x10 (Focus on TKE) LAQ 5lbs 3x10 SLS 30s 3x STS 3x8     12/16 Manual: reviewed patella ROM   SLR 3x10 1.5 lbs  SAQ 3x10 1.5 lbs    Hip abduction 3x12 70 lbs  Leg press 85 lbs 3x15    Lateral  step 2x10 6 inch  Front step up 2x10 6 inch      Treatment                            12/11:  Fwd & lateral step ups Heel raises & stretch on slant board Lateral stepping Walk through gym- added xride and removed leg ext/flexion machines Taichi/yoga discussion   11/25   Testing of the knee and edu of exam findings: TTP of L tibial plateau, TTP of posterior knee, hypermobility of L patella medial and laterally, mild increase in varus/valgus motion  41 cm suprapatellar edema measurement  Self pain management, appropriate HEP, returning to MD  Eval:   Program Notes Ice 2-3x 10-15 have leg proppedContinue with standing exercises from Home Health  Exercises - Supine Hip Adduction Isometric with Ball  - 2 x daily - 7 x weekly - 2 sets - 10 reps - 5 hold - Supine Bridge  - 2 x daily - 7 x weekly - 2 sets - 10 reps - Lateral Lunge Adductor Stretch with Counter Support  - 2 x daily - 7 x weekly - 1 sets - 3 reps - 30 hold - Seated Long Arc Quad  - 2 x daily - 7 x weekly - 3 sets - 10 reps - Long Sitting Quad Set with Towel Roll Under Heel  - 2 x daily - 7 x weekly - 2 sets - 10 reps - 3 hold   PATIENT EDUCATION:  Education details: exam findings, anatomy, exercise progression, DOMS expectations, muscle firing,  envelope of function, HEP, POC Person educated: Patient Education method: Explanation, Demonstration, Tactile cues, Verbal cues, and Handouts Education comprehension: verbalized  understanding, returned demonstration, verbal cues required, tactile cues required, and needs further education  HOME EXERCISE PROGRAM:  Access Code: [email or web address] URL: [email or web address]  ASSESSMENT:  CLINICAL IMPRESSION: Pt is 12 wks out at this time. Pt has improved with ROM and strength of the L knee today with improvement in gait and mobility. Pt does have some residual swelling that causes posterior knee pain. Manual does improve flexion from 115 to 121 by end of session. Pt HEP progressed to improve SL stability as pt would like to return to classes in the gym. Plan to continue with dynamic stability, hurdle steps, and unstable surfaces for return to full activity. Plan for FOTO at next. Pt would benefit from continued skilled therapy in order to reach goals and maximize functional L LE strength and ROM for return to PLOF and normal ADL/community mobility.   OBJECTIVE IMPAIRMENTS: Abnormal gait, decreased activity tolerance, decreased mobility, difficulty walking, decreased ROM, decreased strength, increased edema, and pain.   ACTIVITY LIMITATIONS: carrying, lifting, bending, sitting, standing, squatting, sleeping, stairs, transfers, and locomotion level  PARTICIPATION LIMITATIONS: meal prep, cleaning, laundry, driving, shopping, community activity, occupation, and yard work  PERSONAL FACTORS: 1-2 comorbidities: right hip replacement  are also affecting patient's functional outcome.   REHAB POTENTIAL: Excellent  CLINICAL DECISION MAKING: Stable/uncomplicated  EVALUATION COMPLEXITY: Low   GOALS:   SHORT TERM GOALS: 05/06/2023    Pt will become independent with HEP in order to demonstrate synthesis of PT education.  Baseline: Goal status: MET  2.  Pt will be able to demonstrate full L knee TKE in order to demonstrate functional improvement in LE function for self-care and house hold duties.  Baseline:  Goal status: MET  3.  Pt will be able to demonstrate  normal walking gait without SPC in clinic distances in order to demonstrate functional improvement in LE function for self-care and house hold duties.  Baseline:  Goal status: MET  4.  Pt will score at least 12 pt increase on FOTO to demonstrate functional improvement in MCII and pt perceived function.   Baseline:  Goal status: ongoing  LONG TERM GOALS: 06/17/2023   Pt  will become independent with final HEP in order to demonstrate synthesis of PT education.  Baseline:  Goal status: ongoing  2. Pt will score >/= 66 on FOTO to demonstrate improvement in perceived L LE function.  Baseline:  Goal status:  INITIAL  3. Pt will be able to demonstrate full reciprocal stair pattern with single UE in order to demonstrate functional improvement in LE function for community and household mobility.  Baseline:  Goal status: ongoing  4.  Pt will be able to demonstrate/report ability to walk >30 mins without pain in order to demonstrate functional improvement and tolerance to exercise and community mobility.  Baseline:  Goal status: ongoing  PLAN:  PT FREQUENCY: 1-2x/week  PT DURATION: 12 weeks  PLANNED INTERVENTIONS: Therapeutic exercises, Therapeutic activity, Neuromuscular re-education, Balance training, Gait training, Patient/Family education, Self Care, Joint mobilization, Stair training, DME instructions, Aquatic Therapy, Cryotherapy, Moist heat, Taping, and Manual therapy, Dry Needling  PLAN FOR NEXT SESSION:  progress balance, how did gym program go?  Zebedee Iba PT, DPT 05/27/23 12:44 PM

## 2023-05-28 ENCOUNTER — Ambulatory Visit (INDEPENDENT_AMBULATORY_CARE_PROVIDER_SITE_OTHER): Payer: Medicare Other | Admitting: Physician Assistant

## 2023-05-28 ENCOUNTER — Encounter: Payer: Self-pay | Admitting: Physician Assistant

## 2023-05-28 DIAGNOSIS — Z96652 Presence of left artificial knee joint: Secondary | ICD-10-CM

## 2023-05-28 NOTE — Progress Notes (Signed)
Post-Op Visit Note   Patient: Diane Giles           Date of Birth: July 21, 1955           MRN: [id number] Visit Date: 05/28/2023 PCP: Lula Olszewski, MD   Assessment & Plan:  Chief Complaint:  Chief Complaint  Patient presents with   Left Knee - Follow-up    Left total knee arthroplasty 03/04/2023   Visit Diagnoses:  1. Status post total left knee replacement     Plan: Patient is a pleasant 68 year old female who comes in today 3 months status post left total knee replacement 03/04/2023.  She has been doing great.  She notes an occasional discomfort but nothing more.  She is still in physical therapy.  Examination of the left knee reveals range of motion from 0 to 120 degrees.  She is stable to valgus and varus stress.  She is neurovascularly intact distally.  At this point, I believe she can wean to a home exercise program.  She is agreeable to this plan.  Dental prophylaxis reinforced.  She will follow-up in 3 months for repeat evaluation and and 2 view x-rays of the left knee.  Call with concerns or questions.  Follow-Up Instructions: Return in about 3 months (around 08/26/2023).   Orders:  No orders of the defined types were placed in this encounter.  No orders of the defined types were placed in this encounter.   Imaging: No new imaging  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   Aortoiliac occlusive disease (HCC) 12/03/2016   Centrilobular emphysema (HCC) 10/03/2016   Right groin pain 02/27/2016   Sacroiliitis (HCC) 02/27/2016   Postmenopausal bleeding 12/01/2015    Psoas tendinitis of right side 10/21/2015   Endometrial thickening on ultrasound 07/28/2015   S/P right THA, 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   Type 2 diabetes mellitus (HCC) 05/18/2014   DDD (degenerative disc disease) 03/18/2013   Metabolic syndrome 07/29/2012   Essential hypertension, benign 07/18/2012   Hyperlipidemia 07/18/2012   Obstructive sleep apnea 07/04/2012   Osteoporosis 07/04/2012   Morbid obesity (HCC) 07/04/2012   Past Medical History:  Diagnosis Date   Aortic stenosis    mild-moderate AS 04/22/19 echo   Aortoiliac occlusive disease (HCC) 12/03/2016   Arthritis    Diabetes mellitus without complication (HCC)    pt states she has been told she is no longer diabetic and is not taking meds   Diverticulitis    Gout    Heart murmur    Hyperlipidemia    Hypertension    Osteoporosis    Sleep apnea    Tobacco use disorder 07/04/2012    Family History  Problem Relation Age of Onset   Migraines Mother    Thyroid disease Mother        hypothyroid   Cancer Father 13       Dec age 75 with pancreatic Ca    Past Surgical History:  Procedure Laterality Date   ABDOMINAL AORTOGRAM W/LOWER EXTREMITY N/A 12/03/2016   Procedure: Abdominal Aortogram w/Lower Extremity;  Surgeon: Chuck Hint, MD;  Location: [facility] INVASIVE CV  LAB;  Service: Cardiovascular;  Laterality: N/A;   AORTA -INNOMIATE BYPASS N/A 05/18/2019   Procedure: AORTA -INNOMIATE BYPASS Using Hemashield Gold Graft Size 8mm;  Surgeon: Alleen Borne, MD;  Location: [facility];  Service: Open Heart Surgery;  Laterality: N/A;   aorto- fem bypass Bilateral    aortobiiliac bypass in 2000   DILATATION & CURETTAGE/HYSTEROSCOPY WITH MYOSURE N/A 12/27/2015   Procedure: DILATATION & CURETTAGE/HYSTEROSCOPY;  Surgeon: Patton Salles, MD;  Location: [facility];  Service: Gynecology;  Laterality: N/A;   DILATION AND CURETTAGE OF UTERUS  11/2015   ELBOW SURGERY Left ~2007   tendon repair by Dr. Ranell Patrick   EYE SURGERY Bilateral 2008    lasik   JOINT REPLACEMENT     lower aortic bypass  2000   per patient   PERIPHERAL VASCULAR INTERVENTION  12/03/2016   Stenting of anastomotic stenosis of the aortoiliac and right common iliac artery with 7 x 39 mm VBX covered stent and postdilated with 9 mm balloon.   Right innonimate bypass  05/18/2019   Aorto innominate bypass surgery with median sternotomy   STERNOTOMY  05/18/2019   Right aorto-innonimate bypass surgery 05/18/19 Dr. Laneta Simmers   TOTAL HIP ARTHROPLASTY Right 07/27/2014   Procedure: RIGHT TOTAL HIP ARTHROPLASTY ANTERIOR APPROACH;  Surgeon: Durene Romans, MD;  Location: [facility];  Service: Orthopedics;  Laterality: Right;   TOTAL KNEE ARTHROPLASTY Right 05/28/2022   Procedure: RIGHT TOTAL KNEE ARTHROPLASTY;  Surgeon: Tarry Kos, MD;  Location: [facility];  Service: Orthopedics;  Laterality: Right;   TOTAL KNEE ARTHROPLASTY Left 03/04/2023   Procedure: TOTAL KNEE ARTHROPLASTY;  Surgeon: Tarry Kos, MD;  Location: [facility];  Service: Orthopedics;  Laterality: Left;   Social History   Occupational History   Occupation: Tree surgeon  Tobacco Use   Smoking status: Former    Current packs/day: 0.00    Average packs/day: 0.5 packs/day for 40.0 years (20.0 ttl pk-yrs)    Types: Cigarettes    Start date: 02/23/1977    Quit date: 02/23/2017    Years since quitting: 6.2   Smokeless tobacco: Never  Vaping Use   Vaping status: Never Used  Substance and Sexual Activity   Alcohol use: Yes    Comment: very rare--maybe once a year   Drug use: No   Sexual activity: Yes    Partners: Male    Birth control/protection: Post-menopausal

## 2023-06-03 ENCOUNTER — Other Ambulatory Visit: Payer: Self-pay

## 2023-06-04 ENCOUNTER — Other Ambulatory Visit (HOSPITAL_BASED_OUTPATIENT_CLINIC_OR_DEPARTMENT_OTHER): Payer: Self-pay

## 2023-06-04 ENCOUNTER — Other Ambulatory Visit: Payer: Self-pay

## 2023-06-04 MED FILL — Ezetimibe Tab 10 MG: ORAL | 30 days supply | Qty: 30 | Fill #1 | Status: AC

## 2023-06-05 ENCOUNTER — Other Ambulatory Visit (HOSPITAL_BASED_OUTPATIENT_CLINIC_OR_DEPARTMENT_OTHER): Payer: Self-pay

## 2023-06-07 ENCOUNTER — Other Ambulatory Visit (HOSPITAL_BASED_OUTPATIENT_CLINIC_OR_DEPARTMENT_OTHER): Payer: Self-pay

## 2023-06-11 ENCOUNTER — Encounter (HOSPITAL_BASED_OUTPATIENT_CLINIC_OR_DEPARTMENT_OTHER): Payer: Self-pay | Admitting: Physical Therapy

## 2023-06-11 ENCOUNTER — Other Ambulatory Visit: Payer: Self-pay

## 2023-06-11 ENCOUNTER — Other Ambulatory Visit (HOSPITAL_BASED_OUTPATIENT_CLINIC_OR_DEPARTMENT_OTHER): Payer: Self-pay

## 2023-06-11 ENCOUNTER — Ambulatory Visit (HOSPITAL_BASED_OUTPATIENT_CLINIC_OR_DEPARTMENT_OTHER): Payer: Medicare Other | Attending: Orthopaedic Surgery | Admitting: Physical Therapy

## 2023-06-11 DIAGNOSIS — M6281 Muscle weakness (generalized): Secondary | ICD-10-CM | POA: Insufficient documentation

## 2023-06-11 DIAGNOSIS — R262 Difficulty in walking, not elsewhere classified: Secondary | ICD-10-CM | POA: Diagnosis present

## 2023-06-11 DIAGNOSIS — R600 Localized edema: Secondary | ICD-10-CM | POA: Insufficient documentation

## 2023-06-11 DIAGNOSIS — M25562 Pain in left knee: Secondary | ICD-10-CM | POA: Diagnosis present

## 2023-06-11 MED ORDER — LOSARTAN POTASSIUM 50 MG PO TABS
50.0000 mg | ORAL_TABLET | Freq: Every day | ORAL | 0 refills | Status: DC
  Filled 2023-06-11 (×2): qty 30, 30d supply, fill #0

## 2023-06-11 MED ORDER — LOSARTAN POTASSIUM 100 MG PO TABS
100.0000 mg | ORAL_TABLET | Freq: Every day | ORAL | 11 refills | Status: DC
  Filled 2023-07-11 – 2023-07-22 (×2): qty 30, 30d supply, fill #0

## 2023-06-11 NOTE — Therapy (Signed)
 OUTPATIENT PHYSICAL THERAPY LOWER EXTREMITY TREATMENT   Patient Name: Diane Giles MRN: [id number] DOB:1955/09/11, 68 y.o., female Today's Date: 06/11/2023  END OF SESSION:  PT End of Session - 06/11/23 1107     Visit Number 9    Number of Visits 18    Date for PT Re-Evaluation 06/23/23    Authorization Type MCR    PT Start Time 1104    PT Stop Time 1143    PT Time Calculation (min) 39 min    Activity Tolerance Patient tolerated treatment well    Behavior During Therapy [facility] for tasks assessed/performed                  Past Medical History:  Diagnosis Date   Aortic stenosis    mild-moderate AS 04/22/19 echo   Aortoiliac occlusive disease (HCC) 12/03/2016   Arthritis    Diabetes mellitus without complication (HCC)    pt states she has been told she is no longer diabetic and is not taking meds   Diverticulitis    Gout    Heart murmur    Hyperlipidemia    Hypertension    Osteoporosis    Sleep apnea    Tobacco use disorder 07/04/2012   Past Surgical History:  Procedure Laterality Date   ABDOMINAL AORTOGRAM W/LOWER EXTREMITY N/A 12/03/2016   Procedure: Abdominal Aortogram w/Lower Extremity;  Surgeon: Eliza Lonni RAMAN, MD;  Location: [facility] INVASIVE CV LAB;  Service: Cardiovascular;  Laterality: N/A;   AORTA -INNOMIATE BYPASS N/A 05/18/2019   Procedure: AORTA -INNOMIATE BYPASS Using Hemashield Gold Graft Size 8mm;  Surgeon: Lucas Dorise POUR, MD;  Location: [facility] OR;  Service: Open Heart Surgery;  Laterality: N/A;   aorto- fem bypass Bilateral    aortobiiliac bypass in 2000   DILATATION & CURETTAGE/HYSTEROSCOPY WITH MYOSURE N/A 12/27/2015   Procedure: DILATATION & CURETTAGE/HYSTEROSCOPY;  Surgeon: Bobie FORBES Cathlyn JAYSON Nikki, MD;  Location: [facility] ORS;  Service: Gynecology;  Laterality: N/A;   DILATION AND CURETTAGE OF UTERUS  11/2015   ELBOW SURGERY Left ~2007   tendon repair by Dr. Kay   EYE SURGERY Bilateral 2008   lasik   JOINT REPLACEMENT     lower aortic  bypass  2000   per patient   PERIPHERAL VASCULAR INTERVENTION  12/03/2016   Stenting of anastomotic stenosis of the aortoiliac and right common iliac artery with 7 x 39 mm VBX covered stent and postdilated with 9 mm balloon.   Right innonimate bypass  05/18/2019   Aorto innominate bypass surgery with median sternotomy   STERNOTOMY  05/18/2019   Right aorto-innonimate bypass surgery 05/18/19 Dr. Lucas   TOTAL HIP ARTHROPLASTY Right 07/27/2014   Procedure: RIGHT TOTAL HIP ARTHROPLASTY ANTERIOR APPROACH;  Surgeon: Donnice Car, MD;  Location: [facility] ORS;  Service: Orthopedics;  Laterality: Right;   TOTAL KNEE ARTHROPLASTY Right 05/28/2022   Procedure: RIGHT TOTAL KNEE ARTHROPLASTY;  Surgeon: Jerri Kay HERO, MD;  Location: [facility] OR;  Service: Orthopedics;  Laterality: Right;   TOTAL KNEE ARTHROPLASTY Left 03/04/2023   Procedure: TOTAL KNEE ARTHROPLASTY;  Surgeon: Jerri Kay HERO, MD;  Location: [facility] OR;  Service: Orthopedics;  Laterality: Left;   Patient Active Problem List   Diagnosis Date Noted   Status post total left knee replacement 03/04/2023   Primary osteoarthritis of left knee 07/10/2022   Status post total right knee replacement 05/28/2022   Vitamin B12 deficiency 10/31/2021   Small bowel obstruction (HCC)    Partial small bowel obstruction (HCC) 10/28/2021  PAD (peripheral artery disease) (HCC)    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   Aortoiliac occlusive disease (HCC) 12/03/2016   Centrilobular emphysema (HCC) 10/03/2016   Right groin pain 02/27/2016   Sacroiliitis (HCC) 02/27/2016   Postmenopausal bleeding 12/01/2015   Psoas tendinitis of right side 10/21/2015   Endometrial thickening on ultrasound 07/28/2015   S/P right THA, 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   Type 2 diabetes mellitus (HCC) 05/18/2014   DDD (degenerative disc disease) 03/18/2013   Metabolic syndrome 07/29/2012   Essential hypertension, benign 07/18/2012   Hyperlipidemia 07/18/2012   Obstructive sleep apnea  07/04/2012   Osteoporosis 07/04/2012   Morbid obesity (HCC) 07/04/2012    PCP: Cecille Skiff MD   REFERRING PROVIDER: Ronal Graver PA   REFERRING DIAG: Right TKA  THERAPY DIAG:  Left knee pain, unspecified chronicity  Muscle weakness (generalized)  Localized edema  Difficulty walking  Rationale for Evaluation and Treatment: Rehabilitation  ONSET DATE: 10/28 L TKA  Days since surgery: 99   SUBJECTIVE:   SUBJECTIVE STATEMENT: Patient has been doing very well.  She has been going to the gym on a regular basis.  Some very little pain in her knee.  She has some stiffness at times but it resolves.  She feels like she is ready for discharge.  Eval:  Pt states she fell on Thursday 11/14 after seeing MD for L knee TKA. She does not recall a LOB or LOC but she just remembers landing on the L knee on a bathmat. Pt states the swelling does not seem to going away. There is a sharp/stinging in the groin. Direct pressure will cause pain into the groin and HS. Pt reports landing on the knees and fell fwd. She did hit her head and did not have LOC of concussion symptoms. Pt did not skin/cut the the L knee. Denies s/s on infection.    Pt has had 2 wks of HHPT. Pt did very well with HHPT and was immediately able to bend up to 115 of flexion and -2 of ext.   PERTINENT HISTORY: Aortic stenosis; Pre-diabetic,  Gout ( hasn't had an attack in 1.5 years) Osteopetrosis,  R THA,  Left ebow surgery 2007; R TKA PAIN:  Are you having pain? No: NPRS scale: 0/10 Pain location: L knee  Pain description: aching  Aggravating factors: movement Relieving factors: rest   PRECAUTIONS: None  WEIGHT BEARING RESTRICTIONS: No  FALLS:  Has patient fallen in last 6 months? No  LIVING ENVIRONMENT: 15  going to the second floor. 2 steps going in.   OCCUPATION:  Retired  Presenter, Broadcasting: exercise; [employer] member   PLOF: Independent  PATIENT GOALS:  to get back into her normal routine  OBJECTIVE:    DIAGNOSTIC FINDINGS: 10/28  IMPRESSION: Interval total left knee arthroplasty without evidence of hardware failure.  PATIENT SURVEYS:  FOTO 42 66@ DC 12 pts MCII   COGNITION: Overall cognitive status: Within functional limits for tasks assessed     SENSATION: WFL  EDEMA:  Circumferential: 39.5cm vs 38 cm on R    LOWER EXTREMITY ROM:  Passive ROM L eval L 1/20 R eval  Hip flexion     Hip extension     Hip abduction     Hip adduction     Hip internal rotation     Hip external rotation     Knee flexion 121 120 127  Knee extension -4 1 hyper 0  Ankle dorsiflexion     Ankle plantarflexion  Ankle inversion     Ankle eversion      (Blank rows = not tested)  LOWER EXTREMITY MMT:  MMT Right 1/20 Left 1/20  Hip flexion 4+/5 4+/5  Hip extension    Hip abduction    Hip adduction    Hip internal rotation    Hip external rotation    Knee flexion 4+/5 4/5  Knee extension 5/5 4+/5  Ankle dorsiflexion    Ankle plantarflexion    Ankle inversion    Ankle eversion     (Blank rows = not tested)    FUNCTIONAL TESTS:  STS without UE now   GAIT: ack of full TKE, shortened step length bilat  TODAY'S TREATMENT:                                                                                                                              DATE:   2/4 Manual: All PROM performed with distraction to reduce pain and improve movement Trigger point release to posterior knee; Posterior and anterior glides.  Focused on the extension. There-ex: Reviewed and finalize HEP. Gym: Cybex leg press 100 pounds 3 x 12  Life fitness hip abduction 70 pounds 3 x 12 Hamstring curl life fitness 30 pounds 3 x 12 [employer] knee extension 10 pounds 3 x 12  Reviewed Foto score   1/20  120 flexion; 0 deg of ext   STM L HS; edema sweeping popliteal space  8 runners step up with UE support 2x10 Airex SLS 30s 4x Lateral step up on foam to SLS 2x10     12/27  Recumbent  bike warm up Lvl 1 6 min  Shuttle leg press 62 lbs 3x10 (single leg)  8 step up 3x8 Standing lunge at counter 2x10 each NBOS EC on foam 30s 3x; attempted tandem 3x but unable get get >20s Supine marching bridge 3x10         Treatment                            12/11:  Fwd & lateral step ups Heel raises & stretch on slant board Lateral stepping Walk through gym- added xride and removed leg ext/flexion machines Taichi/yoga discussion   11/25   Testing of the knee and edu of exam findings: TTP of L tibial plateau, TTP of posterior knee, hypermobility of L patella medial and laterally, mild increase in varus/valgus motion  41 cm suprapatellar edema measurement  Self pain management, appropriate HEP, returning to MD  Eval:   Program Notes Ice 2-3x 10-15 have leg proppedContinue with standing exercises from Home Health  Exercises - Supine Hip Adduction Isometric with Ball  - 2 x daily - 7 x weekly - 2 sets - 10 reps - 5 hold - Supine Bridge  - 2 x daily - 7 x weekly - 2 sets - 10 reps - Lateral Lunge Adductor Stretch with Counter  Support  - 2 x daily - 7 x weekly - 1 sets - 3 reps - 30 hold - Seated Long Arc Quad  - 2 x daily - 7 x weekly - 3 sets - 10 reps - Long Sitting Quad Set with Towel Roll Under Heel  - 2 x daily - 7 x weekly - 2 sets - 10 reps - 3 hold   PATIENT EDUCATION:  Education details: exam findings, anatomy, exercise progression, DOMS expectations, muscle firing,  envelope of function, HEP, POC Person educated: Patient Education method: Explanation, Demonstration, Tactile cues, Verbal cues, and Handouts Education comprehension: verbalized understanding, returned demonstration, verbal cues required, tactile cues required, and needs further education  HOME EXERCISE PROGRAM:  Access Code: [email or web address] URL: [email or web address]  ASSESSMENT:  CLINICAL IMPRESSION: The patient has made excellent progress.  She has full range of motion.  For  short period of time on extension.  She had full extension.  She is returned to the gym.  She reviewed her gym program with physical therapy.  Her gym program is good.  We reviewed RPE.  She has a good understanding how to progress her exercise.  At this time she has no further need for physical therapy.  She will continue to work on her exercises at home.  See below for goal specific progress.   OBJECTIVE IMPAIRMENTS: Abnormal gait, decreased activity tolerance, decreased mobility, difficulty walking, decreased ROM, decreased strength, increased edema, and pain.   ACTIVITY LIMITATIONS: carrying, lifting, bending, sitting, standing, squatting, sleeping, stairs, transfers, and locomotion level  PARTICIPATION LIMITATIONS: meal prep, cleaning, laundry, driving, shopping, community activity, occupation, and yard work  PERSONAL FACTORS: 1-2 comorbidities: right hip replacement  are also affecting patient's functional outcome.   REHAB POTENTIAL: Excellent  CLINICAL DECISION MAKING: Stable/uncomplicated  EVALUATION COMPLEXITY: Low   GOALS:   SHORT TERM GOALS: 05/06/2023    Pt will become independent with HEP in order to demonstrate synthesis of PT education.  Baseline: Goal status: MET 2/5  2.  Pt will be able to demonstrate full L knee TKE in order to demonstrate functional improvement in LE function for self-care and house hold duties.  Baseline:  Goal status: MET 2/5  3.  Pt will be able to demonstrate normal walking gait without SPC in clinic distances in order to demonstrate functional improvement in LE function for self-care and house hold duties.  Baseline:  Goal status: MET 2/5  4.  Pt will score at least 12 pt increase on FOTO to demonstrate functional improvement in MCII and pt perceived function.   Baseline:  Goal status: 77% achieved 2/5  LONG TERM GOALS: 06/17/2023   Pt  will become independent with final HEP in order to demonstrate synthesis of PT  education.  Baseline:  Goal status: ongoing  2. Pt will score >/= 66 on FOTO to demonstrate improvement in perceived L LE function.  Baseline:  Goal status: 77% achieved 2/5  3. Pt will be able to demonstrate full reciprocal stair pattern with single UE in order to demonstrate functional improvement in LE function for community and household mobility.  Baseline:  Goal status: Full reciprocal gait pattern on steps  4.  Pt will be able to demonstrate/report ability to walk >30 mins without pain in order to demonstrate functional improvement and tolerance to exercise and community mobility.  Baseline:  Goal status: Community distances without pain  PLAN:  PT FREQUENCY: 1-2x/week  PT DURATION: 12 weeks  PLANNED INTERVENTIONS: Therapeutic exercises, Therapeutic activity, Neuromuscular re-education, Balance  training, Gait training, Patient/Family education, Self Care, Joint mobilization, Stair training, DME instructions, Aquatic Therapy, Cryotherapy, Moist heat, Taping, and Manual therapy, Dry Needling  PLAN FOR NEXT SESSION:  progress balance, how did gym program go?  Alm Don, PT, DPT 06/11/23 11:16 AM

## 2023-06-12 ENCOUNTER — Encounter (HOSPITAL_BASED_OUTPATIENT_CLINIC_OR_DEPARTMENT_OTHER): Payer: Self-pay | Admitting: Physical Therapy

## 2023-06-18 ENCOUNTER — Encounter (HOSPITAL_BASED_OUTPATIENT_CLINIC_OR_DEPARTMENT_OTHER): Payer: Medicare Other | Admitting: Physical Therapy

## 2023-06-20 ENCOUNTER — Other Ambulatory Visit (HOSPITAL_BASED_OUTPATIENT_CLINIC_OR_DEPARTMENT_OTHER): Payer: Self-pay

## 2023-06-20 ENCOUNTER — Other Ambulatory Visit: Payer: Self-pay

## 2023-06-20 MED FILL — Ezetimibe Tab 10 MG: ORAL | 30 days supply | Qty: 30 | Fill #2 | Status: CN

## 2023-06-24 ENCOUNTER — Encounter (HOSPITAL_BASED_OUTPATIENT_CLINIC_OR_DEPARTMENT_OTHER): Payer: Medicare Other | Admitting: Physical Therapy

## 2023-06-26 ENCOUNTER — Other Ambulatory Visit (HOSPITAL_BASED_OUTPATIENT_CLINIC_OR_DEPARTMENT_OTHER): Payer: Self-pay

## 2023-06-26 ENCOUNTER — Other Ambulatory Visit: Payer: Self-pay

## 2023-06-26 MED FILL — Ezetimibe Tab 10 MG: ORAL | 30 days supply | Qty: 30 | Fill #2 | Status: AC

## 2023-07-01 ENCOUNTER — Other Ambulatory Visit: Payer: Self-pay

## 2023-07-02 ENCOUNTER — Other Ambulatory Visit: Payer: Self-pay

## 2023-07-02 ENCOUNTER — Encounter (HOSPITAL_BASED_OUTPATIENT_CLINIC_OR_DEPARTMENT_OTHER): Payer: Medicare Other | Admitting: Physical Therapy

## 2023-07-02 ENCOUNTER — Other Ambulatory Visit (HOSPITAL_BASED_OUTPATIENT_CLINIC_OR_DEPARTMENT_OTHER): Payer: Self-pay

## 2023-07-04 ENCOUNTER — Other Ambulatory Visit (HOSPITAL_BASED_OUTPATIENT_CLINIC_OR_DEPARTMENT_OTHER): Payer: Self-pay

## 2023-07-08 ENCOUNTER — Other Ambulatory Visit (HOSPITAL_BASED_OUTPATIENT_CLINIC_OR_DEPARTMENT_OTHER): Payer: Self-pay

## 2023-07-08 ENCOUNTER — Other Ambulatory Visit: Payer: Self-pay

## 2023-07-11 ENCOUNTER — Other Ambulatory Visit: Payer: Self-pay

## 2023-07-11 ENCOUNTER — Other Ambulatory Visit (HOSPITAL_BASED_OUTPATIENT_CLINIC_OR_DEPARTMENT_OTHER): Payer: Self-pay

## 2023-07-11 MED FILL — Ezetimibe Tab 10 MG: ORAL | 30 days supply | Qty: 30 | Fill #3 | Status: CN

## 2023-07-12 ENCOUNTER — Other Ambulatory Visit: Payer: Self-pay

## 2023-07-12 ENCOUNTER — Inpatient Hospital Stay (HOSPITAL_BASED_OUTPATIENT_CLINIC_OR_DEPARTMENT_OTHER)
Admission: EM | Admit: 2023-07-12 | Discharge: 2023-07-15 | DRG: 390 | Disposition: A | Discharge status: Home / Self Care | Attending: Internal Medicine | Admitting: Internal Medicine

## 2023-07-12 ENCOUNTER — Ambulatory Visit (HOSPITAL_BASED_OUTPATIENT_CLINIC_OR_DEPARTMENT_OTHER)
Admission: RE | Admit: 2023-07-12 | Discharge: 2023-07-12 | Disposition: A | Discharge status: Home / Self Care | Source: Ambulatory Visit | Attending: Geriatric Medicine | Admitting: Geriatric Medicine

## 2023-07-12 ENCOUNTER — Encounter (HOSPITAL_BASED_OUTPATIENT_CLINIC_OR_DEPARTMENT_OTHER): Payer: Self-pay | Admitting: Adult Health

## 2023-07-12 ENCOUNTER — Other Ambulatory Visit (HOSPITAL_BASED_OUTPATIENT_CLINIC_OR_DEPARTMENT_OTHER): Payer: Self-pay | Admitting: Geriatric Medicine

## 2023-07-12 ENCOUNTER — Other Ambulatory Visit (HOSPITAL_BASED_OUTPATIENT_CLINIC_OR_DEPARTMENT_OTHER): Payer: Self-pay

## 2023-07-12 DIAGNOSIS — G4733 Obstructive sleep apnea (adult) (pediatric): Secondary | ICD-10-CM | POA: Diagnosis present

## 2023-07-12 DIAGNOSIS — K56609 Unspecified intestinal obstruction, unspecified as to partial versus complete obstruction: Secondary | ICD-10-CM | POA: Diagnosis not present

## 2023-07-12 DIAGNOSIS — R1030 Lower abdominal pain, unspecified: Secondary | ICD-10-CM

## 2023-07-12 DIAGNOSIS — Z96641 Presence of right artificial hip joint: Secondary | ICD-10-CM | POA: Diagnosis present

## 2023-07-12 DIAGNOSIS — Z79899 Other long term (current) drug therapy: Secondary | ICD-10-CM

## 2023-07-12 DIAGNOSIS — M5126 Other intervertebral disc displacement, lumbar region: Secondary | ICD-10-CM | POA: Diagnosis present

## 2023-07-12 DIAGNOSIS — E1151 Type 2 diabetes mellitus with diabetic peripheral angiopathy without gangrene: Secondary | ICD-10-CM | POA: Diagnosis present

## 2023-07-12 DIAGNOSIS — Z885 Allergy status to narcotic agent status: Secondary | ICD-10-CM

## 2023-07-12 DIAGNOSIS — I10 Essential (primary) hypertension: Secondary | ICD-10-CM | POA: Diagnosis present

## 2023-07-12 DIAGNOSIS — Z7982 Long term (current) use of aspirin: Secondary | ICD-10-CM

## 2023-07-12 DIAGNOSIS — Z87891 Personal history of nicotine dependence: Secondary | ICD-10-CM

## 2023-07-12 DIAGNOSIS — K5651 Intestinal adhesions [bands], with partial obstruction: Secondary | ICD-10-CM | POA: Diagnosis not present

## 2023-07-12 DIAGNOSIS — E785 Hyperlipidemia, unspecified: Secondary | ICD-10-CM | POA: Diagnosis present

## 2023-07-12 DIAGNOSIS — F32A Depression, unspecified: Secondary | ICD-10-CM | POA: Diagnosis present

## 2023-07-12 DIAGNOSIS — F419 Anxiety disorder, unspecified: Secondary | ICD-10-CM | POA: Diagnosis present

## 2023-07-12 DIAGNOSIS — E669 Obesity, unspecified: Secondary | ICD-10-CM | POA: Diagnosis present

## 2023-07-12 DIAGNOSIS — Z96653 Presence of artificial knee joint, bilateral: Secondary | ICD-10-CM | POA: Diagnosis present

## 2023-07-12 DIAGNOSIS — I350 Nonrheumatic aortic (valve) stenosis: Secondary | ICD-10-CM | POA: Diagnosis present

## 2023-07-12 DIAGNOSIS — Z6829 Body mass index (BMI) 29.0-29.9, adult: Secondary | ICD-10-CM

## 2023-07-12 DIAGNOSIS — Z800 Family history of malignant neoplasm of digestive organs: Secondary | ICD-10-CM

## 2023-07-12 DIAGNOSIS — J439 Emphysema, unspecified: Secondary | ICD-10-CM | POA: Diagnosis present

## 2023-07-12 DIAGNOSIS — M810 Age-related osteoporosis without current pathological fracture: Secondary | ICD-10-CM | POA: Diagnosis present

## 2023-07-12 DIAGNOSIS — Z8349 Family history of other endocrine, nutritional and metabolic diseases: Secondary | ICD-10-CM

## 2023-07-12 DIAGNOSIS — I700 Atherosclerosis of aorta: Secondary | ICD-10-CM | POA: Diagnosis present

## 2023-07-12 LAB — COMPREHENSIVE METABOLIC PANEL
ALT: 11 U/L (ref 0–44)
AST: 14 U/L — ABNORMAL LOW (ref 15–41)
Albumin: 4.1 g/dL (ref 3.5–5.0)
Alkaline Phosphatase: 134 U/L — ABNORMAL HIGH (ref 38–126)
Anion gap: 7 (ref 5–15)
BUN: 13 mg/dL (ref 8–23)
CO2: 28 mmol/L (ref 22–32)
Calcium: 9.8 mg/dL (ref 8.9–10.3)
Chloride: 101 mmol/L (ref 98–111)
Creatinine, Ser: 1.09 mg/dL — ABNORMAL HIGH (ref 0.44–1.00)
GFR, Estimated: 55 mL/min — ABNORMAL LOW (ref >60)
Glucose, Bld: 110 mg/dL — ABNORMAL HIGH (ref 70–99)
Potassium: 4.1 mmol/L (ref 3.5–5.1)
Sodium: 136 mmol/L (ref 135–145)
Total Bilirubin: 0.5 mg/dL (ref 0.0–1.2)
Total Protein: 6.3 g/dL — ABNORMAL LOW (ref 6.5–8.1)

## 2023-07-12 LAB — CBC
HCT: 34 % — ABNORMAL LOW (ref 36.0–46.0)
Hemoglobin: 11.5 g/dL — ABNORMAL LOW (ref 12.0–15.0)
MCH: 29.3 pg (ref 26.0–34.0)
MCHC: 33.8 g/dL (ref 30.0–36.0)
MCV: 86.7 fL (ref 80.0–100.0)
Platelets: 229 10*3/uL (ref 150–400)
RBC: 3.92 MIL/uL (ref 3.87–5.11)
RDW: 14.4 % (ref 11.5–15.5)
WBC: 9 10*3/uL (ref 4.0–10.5)
nRBC: 0 % (ref 0.0–0.2)

## 2023-07-12 LAB — LIPASE, BLOOD: Lipase: 31 U/L (ref 11–51)

## 2023-07-12 MED ORDER — IOHEXOL 300 MG/ML  SOLN
100.0000 mL | Freq: Once | INTRAMUSCULAR | Status: AC | PRN
  Administered 2023-07-12: 100 mL via INTRAVENOUS

## 2023-07-12 MED ORDER — LACTATED RINGERS IV BOLUS
1000.0000 mL | Freq: Once | INTRAVENOUS | Status: AC
  Administered 2023-07-12: 1000 mL via INTRAVENOUS

## 2023-07-12 MED ORDER — ONDANSETRON HCL 4 MG/2ML IJ SOLN
4.0000 mg | Freq: Three times a day (TID) | INTRAMUSCULAR | Status: DC | PRN
  Administered 2023-07-12: 4 mg via INTRAVENOUS
  Filled 2023-07-12: qty 2

## 2023-07-12 MED ORDER — FENTANYL CITRATE PF 50 MCG/ML IJ SOSY
50.0000 ug | PREFILLED_SYRINGE | INTRAMUSCULAR | Status: DC | PRN
  Administered 2023-07-12: 50 ug via INTRAVENOUS
  Filled 2023-07-12: qty 1

## 2023-07-12 NOTE — ED Triage Notes (Signed)
 Pt POV, abd pain, CT today, +bowel obstruction, advised to come to ED.

## 2023-07-12 NOTE — ED Provider Notes (Signed)
 [place] EMERGENCY DEPARTMENT AT [facility] Provider Note   CSN: [id number] Arrival date & time: 07/12/23  2236     History  Chief Complaint  Patient presents with   Abdominal Pain    Diane Giles is a 68 y.o. female.   Abdominal Pain Associated symptoms: nausea and vomiting      68 year old female with medical history significant for diverticulitis, hypertension, diabetes mellitus, aortoiliac occlusive disease status post bypass surgery, history of small bowel obstruction x 2 who presents to the emergency department with CT proven bowel obstruction outpatient.  The patient states that she was otherwise asymptomatic until lunch today.  After lunch she developed sudden onset abdominal pain and vomiting.  She states also developed nausea.  Her last bowel movement was yesterday and was normal.  She think she is still passing gas.  Due to her pain, she underwent CT imaging tonight which revealed intermediate to high-grade mid ileal small bowel obstruction with transition point anteriorly at the level of the umbilicus where there are multiple small bowel segments abutting the abdominal wall compatible with adhesive disease.  No pneumatosis was noted.  On arrival, the patient endorses 4 out of 10 abdominal pain.  She endorses nausea.  Home Medications Prior to Admission medications   Medication Sig Start Date End Date Taking? Authorizing Provider  allopurinol (ZYLOPRIM) 100 MG tablet Take 100 mg by mouth in the morning.    [provider]  allopurinol (ZYLOPRIM) 100 MG tablet Take 1 tablet (100 mg total) by mouth daily for uric acid/gout 01/30/23     aspirin EC 81 MG tablet Take 1 tablet (81 mg total) by mouth 2 (two) times daily. To be taken after surgery 02/25/23 02/25/24  Cristie Hem, PA-C  atenolol (TENORMIN) 25 MG tablet Take 1 tablet (25 mg total) by mouth daily. 04/08/23     atenolol (TENORMIN) 50 MG tablet Take 25 mg by mouth in the morning. 10/13/21    [provider]  atorvastatin (LIPITOR) 80 MG tablet Take 1 tablet (80 mg total) by mouth daily at 6 PM. 08/16/17   Sherren Mocha, MD  atorvastatin (LIPITOR) 80 MG tablet Take 1 tablet (80 mg total) by mouth daily at 6 PM. 05/06/23     buPROPion (WELLBUTRIN XL) 150 MG 24 hr tablet Take 150 mg by mouth every morning. 04/25/22   [provider]  buPROPion (WELLBUTRIN XL) 150 MG 24 hr tablet Take 1 tablet (150 mg total) by mouth in the morning for depression/anxiety 03/07/23     CALCIUM 600 1500 (600 Ca) MG TABS tablet Take 1 tablet by mouth daily. 02/18/23   [provider]  Calcium Citrate-Vitamin D (CALCIUM CITRATE +) 315-5 MG-MCG TABS Take 1 tablet by mouth daily. 02/18/23     cholecalciferol (VITAMIN D3) 25 MCG (1000 UNIT) tablet Take 1 tablet (25 mcg total) by mouth every morning. 02/18/23     Cholecalciferol (VITAMIN D3) 250 MCG (10000 UT) capsule Take 10,000 Units by mouth daily.    [provider]  clobetasol (TEMOVATE) 0.05 % GEL Apply 1 Application topically daily.    [provider]  clobetasol (TEMOVATE) 0.05 % GEL Apply to vaginal area once daily 02/21/23     ezetimibe (ZETIA) 10 MG tablet Take 1 tablet (10 mg total) by mouth daily. 01/18/23 01/13/24  Yates Decamp, MD  Lancets MISC Use as directed once a day. (DX: R73.9) 01/20/15   Sherren Mocha, MD  losartan (COZAAR) 100 MG tablet Take  1 tablet (100 mg total) by mouth daily for blood pressure. 06/27/23     losartan (COZAAR) 50 MG tablet Take 50 mg by mouth daily.    [provider]  losartan (COZAAR) 50 MG tablet Take 1 tablet (50 mg total) by mouth daily for blood pressure. If running above 130/80, increase to 2 tablets daily as directed 02/21/23     losartan (COZAAR) 50 MG tablet Take 1 tablet (50 mg total) by mouth daily for blood pressure. Take additional 50 mg tab along with your regualr tab in pill pack for a total of 100 mg daily. 05/09/23     losartan (COZAAR) 50 MG tablet Take 1 tablet  (50mg ) by mouth daily for blood pressure. Take this along with your regular tablet in pill pack for a total of 100 mg daily. 06/11/23     magnesium oxide (MAG-OX) 400 (240 Mg) MG tablet Take 400 mg by mouth daily.    [provider]  magnesium oxide (MAG-OX) 400 MG tablet Take 1 tablet (400 mg total) by mouth daily. 12/31/22     magnesium oxide (MAG-OX) 400 MG tablet TAKE ONE TABLET BY MOUTH EVERY EVENING 05/16/23     methocarbamol (ROBAXIN-750) 750 MG tablet Take 1 tablet (750 mg total) by mouth 2 (two) times daily as needed for muscle spasms. 02/25/23   Cristie Hem, PA-C  ondansetron (ZOFRAN-ODT) 4 MG disintegrating tablet Take 1 tablet (4 mg total) by mouth daily as needed for nausea and vomiting 05/20/22     oxyCODONE-acetaminophen (PERCOCET) 5-325 MG tablet Take 1-2 tablets by mouth every 8 (eight) hours as needed. 03/12/23   Cristie Hem, PA-C  OZEMPIC, 0.25 OR 0.5 MG/DOSE, 2 MG/3ML SOPN Inject 0.5 mg into the skin every Sunday. 05/16/22   [provider]  pregabalin (LYRICA) 75 MG capsule Take 75 mg by mouth 2 (two) times daily. 01/01/20   [provider]  pregabalin (LYRICA) 75 MG capsule Take 1 capsule (75 mg total) by mouth 2 (two) times daily. 03/07/23     senna-docusate (SENOKOT-S) 8.6-50 MG tablet Take 1 tablet by mouth daily. 03/19/23   Prosperi, Christian H, PA-C  traMADol (ULTRAM) 50 MG tablet Take 1-2 tablets (50-100 mg total) by mouth 2 (two) times daily as needed. 03/21/23   Tarry Kos, MD      Allergies    Codeine    Review of Systems   Review of Systems  Gastrointestinal:  Positive for abdominal pain, nausea and vomiting.  All other systems reviewed and are negative.   Physical Exam Updated Vital Signs BP (!) 108/92   Pulse 71   Temp 98 F (36.7 C) (Oral)   Resp 20   Ht 4\' 10"  (1.473 m)   Wt 63.5 kg   LMP 05/07/2005 (Approximate)   SpO2 98%   BMI 29.26 kg/m  Physical Exam Vitals and nursing note reviewed.  Constitutional:       General: She is not in acute distress.    Appearance: She is well-developed.  HENT:     Head: Normocephalic and atraumatic.  Eyes:     Conjunctiva/sclera: Conjunctivae normal.  Cardiovascular:     Rate and Rhythm: Normal rate and regular rhythm.     Heart sounds: No murmur heard. Pulmonary:     Effort: Pulmonary effort is normal. No respiratory distress.     Breath sounds: Normal breath sounds.  Abdominal:     Palpations: Abdomen is soft.     Tenderness: There is generalized  abdominal tenderness. There is guarding.  Musculoskeletal:        General: No swelling.     Cervical back: Neck supple.  Skin:    General: Skin is warm and dry.     Capillary Refill: Capillary refill takes less than 2 seconds.  Neurological:     Mental Status: She is alert.  Psychiatric:        Mood and Affect: Mood normal.     ED Results / Procedures / Treatments   Labs (all labs ordered are listed, but only abnormal results are displayed) Labs Reviewed  COMPREHENSIVE METABOLIC PANEL - Abnormal; Notable for the following components:      Result Value   Glucose, Bld 110 (*)    Creatinine, Ser 1.09 (*)    Total Protein 6.3 (*)    AST 14 (*)    Alkaline Phosphatase 134 (*)    GFR, Estimated 55 (*)    All other components within normal limits  CBC - Abnormal; Notable for the following components:   Hemoglobin 11.5 (*)    HCT 34.0 (*)    All other components within normal limits  LIPASE, BLOOD  URINALYSIS, ROUTINE W REFLEX MICROSCOPIC    EKG None  Radiology CT ABDOMEN PELVIS W CONTRAST Result Date: 07/12/2023 CLINICAL DATA:  Lower abdominal pain, nausea, and vomiting. EXAM: CT ABDOMEN AND PELVIS WITH CONTRAST TECHNIQUE: Multidetector CT imaging of the abdomen and pelvis was performed using the standard protocol following bolus administration of intravenous contrast. RADIATION DOSE REDUCTION: This exam was performed according to the departmental dose-optimization program which includes automated  exposure control, adjustment of the mA and/or kV according to patient size and/or use of iterative reconstruction technique. CONTRAST:  OMNIPAQUE IOHEXOL 300 MG/ML  SOLN COMPARISON:  CTs with IV contrast 03/19/2023 and 11/20/2021 FINDINGS: Lower chest: Evidence of prior median sternotomy. Lung bases are clear. The cardiac size is normal. Atherosclerosis descending thoracic aorta. Hepatobiliary: No focal liver abnormality is seen. No gallstones, gallbladder wall thickening, or biliary dilatation. Pancreas: No abnormality. Spleen: Normal in size without focal abnormality. Adrenals/Urinary Tract: Adrenal glands are unremarkable. Kidneys are normal, without renal calculi, focal lesion, or hydronephrosis. Bladder is unremarkable where visible but partially obscured on the right due to metal artifact from right hip replacement. Stomach/Bowel: There is mild dilatation of the stomach with fluid. The duodenum and jejunum are normal caliber but there are multiple dilated proximal to mid ileal small bowel segments measuring up to 3.8 cm. The transition from dilated to decompressed caliber appears to be anteriorly at the level of the umbilicus were there are multiple small bowel segments abutting the abdominal wall compatible with adhesive disease. Below this level the mid to distal ileal small bowel is collapsed. The degree of caliber difference is consistent with an intermediate to high-grade obstruction. The appendix is normal. There is mild to moderate fecal stasis. The large bowel wall normal in thickness. Vascular/Lymphatic: Aortic atherosclerosis. No enlarged abdominal or pelvic lymph nodes. Patent stenting again noted of the right common iliac artery. Reproductive: Uterus and bilateral adnexa are unremarkable. Other: Trace pelvic ascites. No mesenteric edema. No incarcerated hernia. No free air, free hemorrhage or pneumatosis. Musculoskeletal: Osteopenia and mild degenerative changes of the lumbar spine. Right hip  replacement. There is a progressively worsening sizable disc protrusion at L4-5 increasingly compromising the spinal canal with likely mass effect on both descending L5 nerve roots. IMPRESSION: 1. Intermediate to high-grade, approximately mid ileal small bowel obstruction with transition point anteriorly at the  level of the umbilicus, where there are multiple small bowel segments abutting the abdominal wall compatible with adhesive disease. No pneumatosis. 2. Trace pelvic ascites. 3. Constipation. 4. Aortic atherosclerosis. 5. Progressively enlarging disc protrusion at L4-5 increasingly compromising the spinal canal with likely mass effect on both descending L5 nerve roots. 6. Osteopenia and degenerative change. 7. These results will be called to the ordering clinician or representative by the Radiologist Assistant, and communication documented in the PACS or [employer]. Aortic Atherosclerosis (ICD10-I70.0). Electronically Signed   By: Almira Bar M.D.   On: 07/12/2023 21:37    Procedures Procedures    Medications Ordered in ED Medications  ondansetron [facility]) injection 4 mg (4 mg Intravenous Given 07/12/23 2341)  fentaNYL (SUBLIMAZE) injection 50 mcg (50 mcg Intravenous Given 07/12/23 2342)  lactated ringers bolus 1,000 mL (1,000 mLs Intravenous New Bag/Given 07/12/23 2341)    ED Course/ Medical Decision Making/ A&P                                 Medical Decision Making Amount and/or Complexity of Data Reviewed Labs: ordered. Radiology: ordered.  Risk Prescription drug management. Decision regarding hospitalization.      68 year old female with medical history significant for diverticulitis, hypertension, diabetes mellitus, aortoiliac occlusive disease status post bypass surgery, history of small bowel obstruction x 2 who presents to the emergency department with CT proven bowel obstruction outpatient.  The patient states that she was otherwise asymptomatic until lunch today.   After lunch she developed sudden onset abdominal pain and vomiting.  She states also developed nausea.  Her last bowel movement was yesterday and was normal.  She think she is still passing gas.  Due to her pain, she underwent CT imaging tonight which revealed intermediate to high-grade mid ileal small bowel obstruction with transition point anteriorly at the level of the umbilicus where there are multiple small bowel segments abutting the abdominal wall compatible with adhesive disease.  No pneumatosis was noted.  On arrival, the patient endorses 4 out of 10 abdominal pain.  She endorses nausea.  On arrival, was vitally stable, afebrile, not with mild generalized tenderness to palpation of the abdomen, mild guarding present patient with small bowel obstruction on CT.  IV access was obtained and the patient was administered IV fluid bolus, IV Zofran, IV fentanyl.  General surgery was consulted, spoke with Dr. Derrell Lolling who will see the patient in consultation in the hospital, recommended hospitalist admission and NG tube placement.  An NG tube was ordered for placement.  Patient and family were updated regarding the plan of care bedside.  Spoke with Dr. Haroldine Laws of the hospitalist service who admitted the patient in stable condition.   Final Clinical Impression(s) / ED Diagnoses Final diagnoses:  SBO (small bowel obstruction) (HCC)    Rx / DC Orders ED Discharge Orders     None         Ernie Avena, MD 07/13/23 [phone]

## 2023-07-13 ENCOUNTER — Emergency Department (HOSPITAL_BASED_OUTPATIENT_CLINIC_OR_DEPARTMENT_OTHER)

## 2023-07-13 ENCOUNTER — Inpatient Hospital Stay (HOSPITAL_COMMUNITY)

## 2023-07-13 DIAGNOSIS — M5126 Other intervertebral disc displacement, lumbar region: Secondary | ICD-10-CM | POA: Diagnosis present

## 2023-07-13 DIAGNOSIS — K56609 Unspecified intestinal obstruction, unspecified as to partial versus complete obstruction: Secondary | ICD-10-CM | POA: Diagnosis present

## 2023-07-13 DIAGNOSIS — Z96641 Presence of right artificial hip joint: Secondary | ICD-10-CM | POA: Diagnosis present

## 2023-07-13 DIAGNOSIS — I10 Essential (primary) hypertension: Secondary | ICD-10-CM | POA: Diagnosis present

## 2023-07-13 DIAGNOSIS — Z7982 Long term (current) use of aspirin: Secondary | ICD-10-CM | POA: Diagnosis not present

## 2023-07-13 DIAGNOSIS — Z8349 Family history of other endocrine, nutritional and metabolic diseases: Secondary | ICD-10-CM | POA: Diagnosis not present

## 2023-07-13 DIAGNOSIS — K5651 Intestinal adhesions [bands], with partial obstruction: Secondary | ICD-10-CM | POA: Diagnosis present

## 2023-07-13 DIAGNOSIS — J439 Emphysema, unspecified: Secondary | ICD-10-CM | POA: Diagnosis present

## 2023-07-13 DIAGNOSIS — Z96653 Presence of artificial knee joint, bilateral: Secondary | ICD-10-CM | POA: Diagnosis present

## 2023-07-13 DIAGNOSIS — E785 Hyperlipidemia, unspecified: Secondary | ICD-10-CM | POA: Diagnosis present

## 2023-07-13 DIAGNOSIS — G4733 Obstructive sleep apnea (adult) (pediatric): Secondary | ICD-10-CM | POA: Diagnosis present

## 2023-07-13 DIAGNOSIS — E1151 Type 2 diabetes mellitus with diabetic peripheral angiopathy without gangrene: Secondary | ICD-10-CM | POA: Diagnosis present

## 2023-07-13 DIAGNOSIS — Z6829 Body mass index (BMI) 29.0-29.9, adult: Secondary | ICD-10-CM | POA: Diagnosis not present

## 2023-07-13 DIAGNOSIS — I350 Nonrheumatic aortic (valve) stenosis: Secondary | ICD-10-CM | POA: Diagnosis present

## 2023-07-13 DIAGNOSIS — Z800 Family history of malignant neoplasm of digestive organs: Secondary | ICD-10-CM | POA: Diagnosis not present

## 2023-07-13 DIAGNOSIS — F32A Depression, unspecified: Secondary | ICD-10-CM | POA: Diagnosis present

## 2023-07-13 DIAGNOSIS — E669 Obesity, unspecified: Secondary | ICD-10-CM | POA: Diagnosis present

## 2023-07-13 DIAGNOSIS — F419 Anxiety disorder, unspecified: Secondary | ICD-10-CM | POA: Diagnosis present

## 2023-07-13 DIAGNOSIS — Z79899 Other long term (current) drug therapy: Secondary | ICD-10-CM | POA: Diagnosis not present

## 2023-07-13 DIAGNOSIS — I700 Atherosclerosis of aorta: Secondary | ICD-10-CM | POA: Diagnosis present

## 2023-07-13 DIAGNOSIS — Z885 Allergy status to narcotic agent status: Secondary | ICD-10-CM | POA: Diagnosis not present

## 2023-07-13 DIAGNOSIS — M810 Age-related osteoporosis without current pathological fracture: Secondary | ICD-10-CM | POA: Diagnosis present

## 2023-07-13 DIAGNOSIS — Z87891 Personal history of nicotine dependence: Secondary | ICD-10-CM | POA: Diagnosis not present

## 2023-07-13 LAB — CBC
HCT: 31.4 % — ABNORMAL LOW (ref 36.0–46.0)
Hemoglobin: 10.5 g/dL — ABNORMAL LOW (ref 12.0–15.0)
MCH: 29.6 pg (ref 26.0–34.0)
MCHC: 33.4 g/dL (ref 30.0–36.0)
MCV: 88.5 fL (ref 80.0–100.0)
Platelets: 186 10*3/uL (ref 150–400)
RBC: 3.55 MIL/uL — ABNORMAL LOW (ref 3.87–5.11)
RDW: 14.4 % (ref 11.5–15.5)
WBC: 7.9 10*3/uL (ref 4.0–10.5)
nRBC: 0 % (ref 0.0–0.2)

## 2023-07-13 LAB — GLUCOSE, CAPILLARY
Glucose-Capillary: 81 mg/dL (ref 70–99)
Glucose-Capillary: 91 mg/dL (ref 70–99)
Glucose-Capillary: 80 mg/dL (ref 70–99)
Glucose-Capillary: 87 mg/dL (ref 70–99)
Glucose-Capillary: 71 mg/dL (ref 70–99)
Glucose-Capillary: 69 mg/dL — ABNORMAL LOW (ref 70–99)
Glucose-Capillary: 131 mg/dL — ABNORMAL HIGH (ref 70–99)

## 2023-07-13 LAB — CREATININE, SERUM
Creatinine, Ser: 1.33 mg/dL — ABNORMAL HIGH (ref 0.44–1.00)
GFR, Estimated: 43 mL/min — ABNORMAL LOW (ref >60)

## 2023-07-13 LAB — HEMOGLOBIN A1C
Hgb A1c MFr Bld: 5.6 % (ref 4.8–5.6)
Mean Plasma Glucose: 114.02 mg/dL

## 2023-07-13 LAB — HIV ANTIBODY (ROUTINE TESTING W REFLEX): HIV Screen 4th Generation wRfx: NONREACTIVE

## 2023-07-13 MED ORDER — ONDANSETRON HCL 4 MG/2ML IJ SOLN: 4.0000 mg | Freq: Four times a day (QID) | INTRAMUSCULAR | Status: DC | PRN

## 2023-07-13 MED ORDER — HYDROMORPHONE HCL 1 MG/ML IJ SOLN
0.5000 mg | INTRAMUSCULAR | Status: DC | PRN
  Administered 2023-07-13 – 2023-07-14 (×3): 0.5 mg via INTRAVENOUS
  Filled 2023-07-13 (×3): qty 0.5

## 2023-07-13 MED ORDER — HYDROMORPHONE HCL 1 MG/ML IJ SOLN
0.5000 mg | INTRAMUSCULAR | Status: DC | PRN
Start: 2023-07-13 — End: 2023-07-13

## 2023-07-13 MED ORDER — METOPROLOL TARTRATE 5 MG/5ML IV SOLN: 2.5000 mg | Freq: Four times a day (QID) | INTRAVENOUS | Status: DC | PRN

## 2023-07-13 MED ORDER — ALBUTEROL SULFATE (2.5 MG/3ML) 0.083% IN NEBU: 2.5000 mg | INHALATION_SOLUTION | RESPIRATORY_TRACT | Status: DC | PRN

## 2023-07-13 MED ORDER — SODIUM CHLORIDE 0.9 % IV SOLN: INTRAVENOUS | Status: DC

## 2023-07-13 MED ORDER — HYDROMORPHONE HCL 1 MG/ML IJ SOLN: 0.5000 mg | INTRAMUSCULAR | Status: DC | PRN

## 2023-07-13 MED ORDER — DIATRIZOATE MEGLUMINE & SODIUM 66-10 % PO SOLN
90.0000 mL | Freq: Once | ORAL | Status: AC
  Administered 2023-07-13: 90 mL via NASOGASTRIC
  Filled 2023-07-13: qty 90

## 2023-07-13 MED ORDER — HEPARIN SODIUM (PORCINE) 5000 UNIT/ML IJ SOLN
5000.0000 [IU] | Freq: Three times a day (TID) | INTRAMUSCULAR | Status: DC
  Administered 2023-07-13 – 2023-07-15 (×7): 5000 [IU] via SUBCUTANEOUS
  Filled 2023-07-13 (×7): qty 1

## 2023-07-13 MED ORDER — INSULIN ASPART 100 UNIT/ML IJ SOLN: 0.0000 [IU] | INTRAMUSCULAR | Status: DC

## 2023-07-13 MED ORDER — DEXTROSE 50 % IV SOLN
12.5000 g | INTRAVENOUS | Status: AC
Start: 2023-07-13 — End: 2023-07-13
  Administered 2023-07-13: 12.5 g via INTRAVENOUS
  Filled 2023-07-13: qty 50

## 2023-07-13 MED ORDER — DEXTROSE-SODIUM CHLORIDE 5-0.9 % IV SOLN: INTRAVENOUS | Status: AC

## 2023-07-13 MED ORDER — HYDROMORPHONE HCL 1 MG/ML IJ SOLN
1.0000 mg | INTRAMUSCULAR | Status: DC | PRN
  Administered 2023-07-13: 1 mg via INTRAVENOUS
  Filled 2023-07-13: qty 1

## 2023-07-13 NOTE — ED Notes (Signed)
 Report called to [facility] and given to [facility]

## 2023-07-13 NOTE — H&P (Signed)
 History and Physical    Diane Giles WJX:[id number] DOB: Jun 13, 1955 DOA: 07/12/2023  PCP: Lula Olszewski, MD  Patient coming from: home  I have personally briefly reviewed patient's old medical records in [facility]  Chief Complaint:  abdominal pain,  out patient CT noted bowel obstruction and was referred to ED  HPI: Diane Giles is a 68 y.o. female with medical history significant of  emphysema, obesity, hyperlipidemia, diabetes, peripheral artery disease, previous small bowel obstructions, diverticulitis, Hypertension, OSA as well as tobacco abuse who presents to ED with abdominal pain and dx of SBO. Patient stated around 1:30 pm after eating lunch on 3/7 she began to experience severe abdominal pain. She states it was so severe she f/u with MD who ordered CTAB  s/p which she was  referred to ED after CT noted small bowel obstruction. ED Course:  In ED patient had NG tube placed and currently noted pain has improved.   CTAB Outpatient  IMPRESSION: 1. Intermediate to high-grade, approximately mid ileal small bowel obstruction with transition point anteriorly at the level of the umbilicus, where there are multiple small bowel segments abutting the abdominal wall compatible with adhesive disease. No pneumatosis. 2. Trace pelvic ascites. 3. Constipation. 4. Aortic atherosclerosis. 5. Progressively enlarging disc protrusion at L4-5 increasingly compromising the spinal canal with likely mass effect on both descending L5 nerve roots. 6. Osteopenia and degenerative change. 7. These results will be called to the ordering clinician or representative by the Radiologist Assistant, and communication documented in the PACS or [employer].  Vitals: Afeb, bp 143/106 repeat 129/55, hr 81, rr 20  sat 96%  Wbc 9, hgb 11.5,  plt 229 Lipase31 Na 136, K 4.1, CL 101, Glu 110, cr 1.09 AST 14, Alkphos 134 GFR 55 LR 1L, zofran 4mg  , fentanyl 50 mcg KUB IMPRESSION: Nasogastric  tube tip is in the body of the stomach. Tx : dilaudid, insulin Review of Systems: As per HPI otherwise 10 point review of systems negative.   Past Medical History:  Diagnosis Date   Aortic stenosis    mild-moderate AS 04/22/19 echo   Aortoiliac occlusive disease (HCC) 12/03/2016   Arthritis    Diabetes mellitus without complication (HCC)    pt states she has been told she is no longer diabetic and is not taking meds   Diverticulitis    Gout    Heart murmur    Hyperlipidemia    Hypertension    Osteoporosis    Sleep apnea    Tobacco use disorder 07/04/2012    Past Surgical History:  Procedure Laterality Date   ABDOMINAL AORTOGRAM W/LOWER EXTREMITY N/A 12/03/2016   Procedure: Abdominal Aortogram w/Lower Extremity;  Surgeon: Chuck Hint, MD;  Location: [facility] INVASIVE CV LAB;  Service: Cardiovascular;  Laterality: N/A;   AORTA -INNOMIATE BYPASS N/A 05/18/2019   Procedure: AORTA -INNOMIATE BYPASS Using Hemashield Gold Graft Size 8mm;  Surgeon: Alleen Borne, MD;  Location: [facility] OR;  Service: Open Heart Surgery;  Laterality: N/A;   aorto- fem bypass Bilateral    aortobiiliac bypass in 2000   DILATATION & CURETTAGE/HYSTEROSCOPY WITH MYOSURE N/A 12/27/2015   Procedure: DILATATION & CURETTAGE/HYSTEROSCOPY;  Surgeon: Patton Salles, MD;  Location: [facility] ORS;  Service: Gynecology;  Laterality: N/A;   DILATION AND CURETTAGE OF UTERUS  11/2015   ELBOW SURGERY Left ~2007   tendon repair by Dr. Ranell Patrick   EYE SURGERY Bilateral 2008   lasik   JOINT REPLACEMENT  lower aortic bypass  2000   per patient   PERIPHERAL VASCULAR INTERVENTION  12/03/2016   Stenting of anastomotic stenosis of the aortoiliac and right common iliac artery with 7 x 39 mm VBX covered stent and postdilated with 9 mm balloon.   Right innonimate bypass  05/18/2019   Aorto innominate bypass surgery with median sternotomy   STERNOTOMY  05/18/2019   Right aorto-innonimate bypass surgery 05/18/19 Dr. Laneta Simmers    TOTAL HIP ARTHROPLASTY Right 07/27/2014   Procedure: RIGHT TOTAL HIP ARTHROPLASTY ANTERIOR APPROACH;  Surgeon: Durene Romans, MD;  Location: [facility] ORS;  Service: Orthopedics;  Laterality: Right;   TOTAL KNEE ARTHROPLASTY Right 05/28/2022   Procedure: RIGHT TOTAL KNEE ARTHROPLASTY;  Surgeon: Tarry Kos, MD;  Location: [facility] OR;  Service: Orthopedics;  Laterality: Right;   TOTAL KNEE ARTHROPLASTY Left 03/04/2023   Procedure: TOTAL KNEE ARTHROPLASTY;  Surgeon: Tarry Kos, MD;  Location: [facility] OR;  Service: Orthopedics;  Laterality: Left;     reports that she quit smoking about 6 years ago. Her smoking use included cigarettes. She started smoking about 46 years ago. She has a 20 pack-year smoking history. She has never used smokeless tobacco. She reports current alcohol use. She reports that she does not use drugs.  Allergies  Allergen Reactions   Codeine Itching    Family History  Problem Relation Age of Onset   Migraines Mother    Thyroid disease Mother        hypothyroid   Cancer Father 18       Dec age 49 with pancreatic Ca    Prior to Admission medications   Medication Sig Start Date End Date Taking? Authorizing Provider  allopurinol (ZYLOPRIM) 100 MG tablet Take 100 mg by mouth in the morning.    [provider]  allopurinol (ZYLOPRIM) 100 MG tablet Take 1 tablet (100 mg total) by mouth daily for uric acid/gout 01/30/23     aspirin EC 81 MG tablet Take 1 tablet (81 mg total) by mouth 2 (two) times daily. To be taken after surgery 02/25/23 02/25/24  Cristie Hem, PA-C  atenolol (TENORMIN) 25 MG tablet Take 1 tablet (25 mg total) by mouth daily. 04/08/23     atenolol (TENORMIN) 50 MG tablet Take 25 mg by mouth in the morning. 10/13/21   [provider]  atorvastatin (LIPITOR) 80 MG tablet Take 1 tablet (80 mg total) by mouth daily at 6 PM. 08/16/17   Sherren Mocha, MD  atorvastatin (LIPITOR) 80 MG tablet Take 1 tablet (80 mg total) by mouth daily at 6 PM. 05/06/23      buPROPion (WELLBUTRIN XL) 150 MG 24 hr tablet Take 150 mg by mouth every morning. 04/25/22   [provider]  buPROPion (WELLBUTRIN XL) 150 MG 24 hr tablet Take 1 tablet (150 mg total) by mouth in the morning for depression/anxiety 03/07/23     CALCIUM 600 1500 (600 Ca) MG TABS tablet Take 1 tablet by mouth daily. 02/18/23   [provider]  Calcium Citrate-Vitamin D (CALCIUM CITRATE +) 315-5 MG-MCG TABS Take 1 tablet by mouth daily. 02/18/23     cholecalciferol (VITAMIN D3) 25 MCG (1000 UNIT) tablet Take 1 tablet (25 mcg total) by mouth every morning. 02/18/23     Cholecalciferol (VITAMIN D3) 250 MCG (10000 UT) capsule Take 10,000 Units by mouth daily.    [provider]  clobetasol (TEMOVATE) 0.05 % GEL Apply 1 Application topically daily.    [provider]  clobetasol (  TEMOVATE) 0.05 % GEL Apply to vaginal area once daily 02/21/23     ezetimibe (ZETIA) 10 MG tablet Take 1 tablet (10 mg total) by mouth daily. 01/18/23 01/13/24  Yates Decamp, MD  Lancets MISC Use as directed once a day. (DX: R73.9) 01/20/15   Sherren Mocha, MD  losartan (COZAAR) 100 MG tablet Take 1 tablet (100 mg total) by mouth daily for blood pressure. 06/27/23     losartan (COZAAR) 50 MG tablet Take 50 mg by mouth daily.    [provider]  losartan (COZAAR) 50 MG tablet Take 1 tablet (50 mg total) by mouth daily for blood pressure. If running above 130/80, increase to 2 tablets daily as directed 02/21/23     losartan (COZAAR) 50 MG tablet Take 1 tablet (50 mg total) by mouth daily for blood pressure. Take additional 50 mg tab along with your regualr tab in pill pack for a total of 100 mg daily. 05/09/23     losartan (COZAAR) 50 MG tablet Take 1 tablet (50mg ) by mouth daily for blood pressure. Take this along with your regular tablet in pill pack for a total of 100 mg daily. 06/11/23     magnesium oxide (MAG-OX) 400 (240 Mg) MG tablet Take 400 mg by mouth daily.    [provider]   magnesium oxide (MAG-OX) 400 MG tablet Take 1 tablet (400 mg total) by mouth daily. 12/31/22     magnesium oxide (MAG-OX) 400 MG tablet TAKE ONE TABLET BY MOUTH EVERY EVENING 05/16/23     methocarbamol (ROBAXIN-750) 750 MG tablet Take 1 tablet (750 mg total) by mouth 2 (two) times daily as needed for muscle spasms. 02/25/23   Cristie Hem, PA-C  ondansetron (ZOFRAN-ODT) 4 MG disintegrating tablet Take 1 tablet (4 mg total) by mouth daily as needed for nausea and vomiting 05/20/22     oxyCODONE-acetaminophen (PERCOCET) 5-325 MG tablet Take 1-2 tablets by mouth every 8 (eight) hours as needed. 03/12/23   Cristie Hem, PA-C  OZEMPIC, 0.25 OR 0.5 MG/DOSE, 2 MG/3ML SOPN Inject 0.5 mg into the skin every Sunday. 05/16/22   [provider]  pregabalin (LYRICA) 75 MG capsule Take 75 mg by mouth 2 (two) times daily. 01/01/20   [provider]  pregabalin (LYRICA) 75 MG capsule Take 1 capsule (75 mg total) by mouth 2 (two) times daily. 03/07/23     senna-docusate (SENOKOT-S) 8.6-50 MG tablet Take 1 tablet by mouth daily. 03/19/23   Prosperi, Christian H, PA-C  traMADol (ULTRAM) 50 MG tablet Take 1-2 tablets (50-100 mg total) by mouth 2 (two) times daily as needed. 03/21/23   Tarry Kos, MD    Physical Exam: Vitals:   07/13/23 0055 07/13/23 0100 07/13/23 0145 07/13/23 0226  BP: (!) 129/46 (!) 118/49 (!) 129/55 (!) 139/52  Pulse: 74 70 80 75  Resp:    17  Temp:    98.5 F (36.9 C)  TempSrc:    Oral  SpO2: 91% 95% 97% 99%  Weight:    65.4 kg  Height:        Constitutional: NAD, calm, comfortable Vitals:   07/13/23 0055 07/13/23 0100 07/13/23 0145 07/13/23 0226  BP: (!) 129/46 (!) 118/49 (!) 129/55 (!) 139/52  Pulse: 74 70 80 75  Resp:    17  Temp:    98.5 F (36.9 C)  TempSrc:    Oral  SpO2: 91% 95% 97% 99%  Weight:    65.4 kg  Height:  Eyes: PERRL, lids and conjunctivae normal ENMT: Mucous membranes are moist. Posterior pharynx clear of any exudate or  lesions.Normal dentition.  Neck: normal, supple, no masses, no thyromegaly Respiratory: clear to auscultation bilaterally, no wheezing, no crackles. Normal respiratory effort. No accessory muscle use.  Cardiovascular: Regular rate and rhythm, no murmurs / rubs / gallops. No extremity edema. 2+ pedal pulses. Abdomen: no tenderness, no masses palpated. No hepatosplenomegaly. Bowel sounds positive currently slightly hypoactive Musculoskeletal: no clubbing / cyanosis. No joint deformity upper and lower extremities. Good ROM, no contractures. Normal muscle tone.  Skin: no rashes, lesions, ulcers. No induration Neurologic: CN 2-12 grossly intact. Sensation intact, Strength 5/5 in all 4.  Psychiatric: Normal judgment and insight. Alert and oriented x 3. Normal mood.    Labs on Admission: I have personally reviewed following labs and imaging studies  CBC: Recent Labs  Lab 07/12/23 2257  WBC 9.0  HGB 11.5*  HCT 34.0*  MCV 86.7  PLT 229   Basic Metabolic Panel: Recent Labs  Lab 07/12/23 2257  NA 136  K 4.1  CL 101  CO2 28  GLUCOSE 110*  BUN 13  CREATININE 1.09*  CALCIUM 9.8   GFR: Estimated Creatinine Clearance: 38.4 mL/min (A) (by C-G formula based on SCr of 1.09 mg/dL (H)). Liver Function Tests: Recent Labs  Lab 07/12/23 2257  AST 14*  ALT 11  ALKPHOS 134*  BILITOT 0.5  PROT 6.3*  ALBUMIN 4.1   Recent Labs  Lab 07/12/23 2257  LIPASE 31   No results for input(s): "AMMONIA" in the last 168 hours. Coagulation Profile: No results for input(s): "INR", "PROTIME" in the last 168 hours. Cardiac Enzymes: No results for input(s): "CKTOTAL", "CKMB", "CKMBINDEX", "TROPONINI" in the last 168 hours. BNP (last 3 results) No results for input(s): "PROBNP" in the last 8760 hours. HbA1C: No results for input(s): "HGBA1C" in the last 72 hours. CBG: Recent Labs  Lab 07/13/23 0235 07/13/23 0407  GLUCAP 81 91   Lipid Profile: No results for input(s): "CHOL", "HDL", "LDLCALC",  "TRIG", "CHOLHDL", "LDLDIRECT" in the last 72 hours. Thyroid Function Tests: No results for input(s): "TSH", "T4TOTAL", "FREET4", "T3FREE", "THYROIDAB" in the last 72 hours. Anemia Panel: No results for input(s): "VITAMINB12", "FOLATE", "FERRITIN", "TIBC", "IRON", "RETICCTPCT" in the last 72 hours. Urine analysis:    Component Value Date/Time   COLORURINE STRAW (A) 05/14/2019 1313   APPEARANCEUR HAZY (A) 05/14/2019 1313   LABSPEC 1.006 05/14/2019 1313   PHURINE 6.0 05/14/2019 1313   GLUCOSEU NEGATIVE 05/14/2019 1313   HGBUR NEGATIVE 05/14/2019 1313   BILIRUBINUR NEGATIVE 05/14/2019 1313   BILIRUBINUR negative 09/20/2017 1749   BILIRUBINUR neg 05/14/2014 1515   KETONESUR NEGATIVE 05/14/2019 1313   PROTEINUR NEGATIVE 05/14/2019 1313   UROBILINOGEN 0.2 09/20/2017 1749   UROBILINOGEN 0.2 07/19/2014 0927   NITRITE NEGATIVE 05/14/2019 1313   LEUKOCYTESUR LARGE (A) 05/14/2019 1313    Radiological Exams on Admission: DG Abdomen 1 View Result Date: 07/13/2023 CLINICAL DATA:  NG tube EXAM: ABDOMEN - 1 VIEW COMPARISON:  None Available. FINDINGS: Nasogastric tube tip is in the body of the stomach. IMPRESSION: Nasogastric tube tip is in the body of the stomach. Electronically Signed   By: Darliss Cheney M.D.   On: 07/13/2023 01:21   CT ABDOMEN PELVIS W CONTRAST Result Date: 07/12/2023 CLINICAL DATA:  Lower abdominal pain, nausea, and vomiting. EXAM: CT ABDOMEN AND PELVIS WITH CONTRAST TECHNIQUE: Multidetector CT imaging of the abdomen and pelvis was performed using the standard protocol following bolus administration  of intravenous contrast. RADIATION DOSE REDUCTION: This exam was performed according to the departmental dose-optimization program which includes automated exposure control, adjustment of the mA and/or kV according to patient size and/or use of iterative reconstruction technique. CONTRAST:  OMNIPAQUE IOHEXOL 300 MG/ML  SOLN COMPARISON:  CTs with IV contrast 03/19/2023 and 11/20/2021  FINDINGS: Lower chest: Evidence of prior median sternotomy. Lung bases are clear. The cardiac size is normal. Atherosclerosis descending thoracic aorta. Hepatobiliary: No focal liver abnormality is seen. No gallstones, gallbladder wall thickening, or biliary dilatation. Pancreas: No abnormality. Spleen: Normal in size without focal abnormality. Adrenals/Urinary Tract: Adrenal glands are unremarkable. Kidneys are normal, without renal calculi, focal lesion, or hydronephrosis. Bladder is unremarkable where visible but partially obscured on the right due to metal artifact from right hip replacement. Stomach/Bowel: There is mild dilatation of the stomach with fluid. The duodenum and jejunum are normal caliber but there are multiple dilated proximal to mid ileal small bowel segments measuring up to 3.8 cm. The transition from dilated to decompressed caliber appears to be anteriorly at the level of the umbilicus were there are multiple small bowel segments abutting the abdominal wall compatible with adhesive disease. Below this level the mid to distal ileal small bowel is collapsed. The degree of caliber difference is consistent with an intermediate to high-grade obstruction. The appendix is normal. There is mild to moderate fecal stasis. The large bowel wall normal in thickness. Vascular/Lymphatic: Aortic atherosclerosis. No enlarged abdominal or pelvic lymph nodes. Patent stenting again noted of the right common iliac artery. Reproductive: Uterus and bilateral adnexa are unremarkable. Other: Trace pelvic ascites. No mesenteric edema. No incarcerated hernia. No free air, free hemorrhage or pneumatosis. Musculoskeletal: Osteopenia and mild degenerative changes of the lumbar spine. Right hip replacement. There is a progressively worsening sizable disc protrusion at L4-5 increasingly compromising the spinal canal with likely mass effect on both descending L5 nerve roots. IMPRESSION: 1. Intermediate to high-grade,  approximately mid ileal small bowel obstruction with transition point anteriorly at the level of the umbilicus, where there are multiple small bowel segments abutting the abdominal wall compatible with adhesive disease. No pneumatosis. 2. Trace pelvic ascites. 3. Constipation. 4. Aortic atherosclerosis. 5. Progressively enlarging disc protrusion at L4-5 increasingly compromising the spinal canal with likely mass effect on both descending L5 nerve roots. 6. Osteopenia and degenerative change. 7. These results will be called to the ordering clinician or representative by the Radiologist Assistant, and communication documented in the PACS or [employer]. Aortic Atherosclerosis (ICD10-I70.0). Electronically Signed   By: Almira Bar M.D.   On: 07/12/2023 21:37    EKG: Independently reviewed. Pending   Assessment/Plan  SBO -history of recurrent sbo -CT scan :small bowelo bstruction with transition point anteriorly at the level of theumbilicus, where there are multiple small bowel segments abutting the abdominal wall compatible with adhesive disease.  -admit to med tele  - NG to Low intermittent wall suction  - surgery to see in am DR Leonard Downing -supportive care with ivfs and pain medication   COPD -not on controller medication  -prn nebs   HLD -oral medications on hold   DMII -iss/fs -with low sliding scale   PVD -holding po medications currenlty   Hypertension -prn iv metoprolol  -resume oral medication once patient able to take po   OSA -[place] at bedtime as due to sbo will hold cpap at bedtime  -resume cpap once sbo has resolved   DVT prophylaxis: heparin Code Status: full/ as discussed per  patient wishes in event of cardiac arrest  Family Communication: none at bedside  Disposition Plan: patient  expected to be admitted greater than 2 midnights  Consults called: Surgery dr Leonard Downing Admission status: met tele   Lurline Del MD [facility]   If 7PM-7AM, please  contact night-coverage [email or web address] Password [email or web address]  07/13/2023, 4:14 AM

## 2023-07-13 NOTE — Plan of Care (Signed)
  Problem: Clinical Measurements: Goal: Will remain free from infection Outcome: Progressing   Problem: Activity: Goal: Risk for activity intolerance will decrease Outcome: Progressing   Problem: Coping: Goal: Level of anxiety will decrease Outcome: Progressing   Problem: Pain Managment: Goal: General experience of comfort will improve and/or be controlled Outcome: Progressing   Problem: Safety: Goal: Ability to remain free from injury will improve Outcome: Progressing

## 2023-07-13 NOTE — Progress Notes (Signed)
  Progress Note   Patient: Diane Giles UEA:[id number] DOB: 07/21/1955 DOA: 07/12/2023     0 DOS: the patient was seen and examined on 07/13/2023   Brief hospital course: 68 y.o. female with medical history significant of  emphysema, obesity, hyperlipidemia, diabetes, peripheral artery disease, previous small bowel obstructions, diverticulitis, Hypertension, OSA as well as tobacco abuse who presents to ED with abdominal pain and dx of SBO. Patient stated around 1:30 pm after eating lunch on 3/7 she began to experience severe abdominal pain. She states it was so severe she f/u with MD who ordered CTAB  s/p which she was  referred to ED after CT noted small bowel obstruction.  Assessment and Plan: SBO -history of recurrent sbo -CT scan :small bowelo bstruction with transition point anteriorly at the level of theumbilicus, where there are multiple small bowel segments abutting the abdominal wall compatible with adhesive disease.  -General Surgery following -Pt reports flatus this AM. Pos bs on exam -Surgery recs for protocol today, no indication for surgery    COPD -not on controller medication  -prn nebs  -on minimal o2 support at this time   HLD -oral medications on hold    DMII -iss/fs -with low sliding scale    PVD -holding po medications currenlty    Hypertension -BP stable at this time -prn iv metoprolol  -resume oral medication once patient able to take po    OSA -plans to resume cpap once sbo improves       Subjective: Reports passing flatus this AM  Physical Exam: Vitals:   07/13/23 0226 07/13/23 0756 07/13/23 1110 07/13/23 1500  BP: (!) 139/52 (!) 119/40 (!) 130/45 (!) 108/37  Pulse: 75 77 76 78  Resp: 17 16 16 17   Temp: 98.5 F (36.9 C) 98.6 F (37 C)  98 F (36.7 C)  TempSrc: Oral Oral  Oral  SpO2: 99% 94% 93% 96%  Weight: 65.4 kg     Height:       General exam: Awake, laying in bed, in nad Respiratory system: Normal respiratory effort, no  wheezing Cardiovascular system: regular rate, s1, s2 Gastrointestinal system: Soft, positive BS Central nervous system: CN2-12 grossly intact, strength intact Extremities: Perfused, no clubbing Skin: Normal skin turgor, no notable skin lesions seen Psychiatry: Mood normal // no visual hallucinations   Data Reviewed:  Labs reviewed: Cr 1.33, hgb 10.5  Family Communication: Pt in room, family at bedside  Disposition: Status is: Inpatient Remains inpatient appropriate because: severity of illness  Planned Discharge Destination: Home    Author: Rickey Barbara, MD 07/13/2023 4:11 PM  For on call review [email or web address].

## 2023-07-13 NOTE — Consult Note (Signed)
 Reason for Consult:ab pain, sbo Referring Physician: Dr Geryl Councilman is an 68 y.o. female.  HPI: 46 yof with prior aortobifem bypass presents with < 24 hours of ab pain, nausea.  Not having bowel function. She has had prior sbo treated conservatively last 2023.  She was at outside facility and had ct that showed sbo, trace pelvic ascites.  Had ng placed with resolution of pain and transferred here.  She is passing some flatus and denies ab pain when I see her.   Past Medical History:  Diagnosis Date   Aortic stenosis    mild-moderate AS 04/22/19 echo   Aortoiliac occlusive disease (HCC) 12/03/2016   Arthritis    Diabetes mellitus without complication (HCC)    pt states she has been told she is no longer diabetic and is not taking meds   Diverticulitis    Gout    Heart murmur    Hyperlipidemia    Hypertension    Osteoporosis    Sleep apnea    Tobacco use disorder 07/04/2012    Past Surgical History:  Procedure Laterality Date   ABDOMINAL AORTOGRAM W/LOWER EXTREMITY N/A 12/03/2016   Procedure: Abdominal Aortogram w/Lower Extremity;  Surgeon: Chuck Hint, MD;  Location: [facility] INVASIVE CV LAB;  Service: Cardiovascular;  Laterality: N/A;   AORTA -INNOMIATE BYPASS N/A 05/18/2019   Procedure: AORTA -INNOMIATE BYPASS Using Hemashield Gold Graft Size 8mm;  Surgeon: Alleen Borne, MD;  Location: [facility] OR;  Service: Open Heart Surgery;  Laterality: N/A;   aorto- fem bypass Bilateral    aortobiiliac bypass in 2000   DILATATION & CURETTAGE/HYSTEROSCOPY WITH MYOSURE N/A 12/27/2015   Procedure: DILATATION & CURETTAGE/HYSTEROSCOPY;  Surgeon: Patton Salles, MD;  Location: [facility] ORS;  Service: Gynecology;  Laterality: N/A;   DILATION AND CURETTAGE OF UTERUS  11/2015   ELBOW SURGERY Left ~2007   tendon repair by Dr. Ranell Patrick   EYE SURGERY Bilateral 2008   lasik   JOINT REPLACEMENT     lower aortic bypass  2000   per patient   PERIPHERAL VASCULAR INTERVENTION   12/03/2016   Stenting of anastomotic stenosis of the aortoiliac and right common iliac artery with 7 x 39 mm VBX covered stent and postdilated with 9 mm balloon.   Right innonimate bypass  05/18/2019   Aorto innominate bypass surgery with median sternotomy   STERNOTOMY  05/18/2019   Right aorto-innonimate bypass surgery 05/18/19 Dr. Laneta Simmers   TOTAL HIP ARTHROPLASTY Right 07/27/2014   Procedure: RIGHT TOTAL HIP ARTHROPLASTY ANTERIOR APPROACH;  Surgeon: Durene Romans, MD;  Location: [facility] ORS;  Service: Orthopedics;  Laterality: Right;   TOTAL KNEE ARTHROPLASTY Right 05/28/2022   Procedure: RIGHT TOTAL KNEE ARTHROPLASTY;  Surgeon: Tarry Kos, MD;  Location: [facility] OR;  Service: Orthopedics;  Laterality: Right;   TOTAL KNEE ARTHROPLASTY Left 03/04/2023   Procedure: TOTAL KNEE ARTHROPLASTY;  Surgeon: Tarry Kos, MD;  Location: [facility] OR;  Service: Orthopedics;  Laterality: Left;    Family History  Problem Relation Age of Onset   Migraines Mother    Thyroid disease Mother        hypothyroid   Cancer Father 62       Dec age 67 with pancreatic Ca    Social History:  reports that she quit smoking about 6 years ago. Her smoking use included cigarettes. She started smoking about 46 years ago. She has a 20 pack-year smoking history. She has never used smokeless tobacco. She reports current  alcohol use. She reports that she does not use drugs.  Allergies:  Allergies  Allergen Reactions   Codeine Itching   Current Facility-Administered Medications  Medication Dose Route Frequency Provider Last Rate Last Admin   0.9 %  sodium chloride infusion   Intravenous Continuous Joneen Roach, Debby, MD 100 mL/hr at 07/13/23 0230 New Bag at 07/13/23 0230   albuterol (PROVENTIL) (2.5 MG/3ML) 0.083% nebulizer solution 2.5 mg  2.5 mg Nebulization Q2H PRN Lurline Del, MD       diatrizoate meglumine-sodium (GASTROGRAFIN) 66-10 % solution 90 mL  90 mL Per NG tube Once Emelia Loron, MD       heparin injection  5,000 Units  5,000 Units Subcutaneous Q8H Skip Mayer A, MD   5,000 Units at 07/13/23 0630   HYDROmorphone (DILAUDID) injection 0.5 mg  0.5 mg Intravenous Q3H PRN Gery Pray, MD   0.5 mg at 07/13/23 0243   HYDROmorphone (DILAUDID) injection 1 mg  1 mg Intravenous Q4H PRN Crosley, Debby, MD       insulin aspart (novoLOG) injection 0-15 Units  0-15 Units Subcutaneous Q4H Crosley, Debby, MD       metoprolol tartrate (LOPRESSOR) injection 2.5 mg  2.5 mg Intravenous Q6H PRN Lurline Del, MD       ondansetron [facility]) injection 4 mg  4 mg Intravenous Q6H PRN Gery Pray, MD         Results for orders placed or performed during the hospital encounter of 07/12/23 (from the past 48 hours)  Lipase, blood     Status: None   Collection Time: 07/12/23 10:57 PM  Result Value Ref Range   Lipase 31 11 - 51 U/L    Comment: Performed at [facility], [address] [place], [place] [address]  Comprehensive metabolic panel     Status: Abnormal   Collection Time: 07/12/23 10:57 PM  Result Value Ref Range   Sodium 136 135 - 145 mmol/L   Potassium 4.1 3.5 - 5.1 mmol/L   Chloride 101 98 - 111 mmol/L   CO2 28 22 - 32 mmol/L   Glucose, Bld 110 (H) 70 - 99 mg/dL    Comment: Glucose reference range applies only to samples taken after fasting for at least 8 hours.   BUN 13 8 - 23 mg/dL   Creatinine, Ser 9.81 (H) 0.44 - 1.00 mg/dL   Calcium 9.8 8.9 - 19.1 mg/dL   Total Protein 6.3 (L) 6.5 - 8.1 g/dL   Albumin 4.1 3.5 - 5.0 g/dL   AST 14 (L) 15 - 41 U/L   ALT 11 0 - 44 U/L   Alkaline Phosphatase 134 (H) 38 - 126 U/L   Total Bilirubin 0.5 0.0 - 1.2 mg/dL   GFR, Estimated 55 (L) >60 mL/min    Comment: (NOTE) Calculated using the CKD-EPI Creatinine Equation (2021)    Anion gap 7 5 - 15    Comment: Performed at [facility], [address], [place], [place] [address]  CBC     Status: Abnormal   Collection Time: 07/12/23 10:57 PM  Result Value Ref  Range   WBC 9.0 4.0 - 10.5 K/uL   RBC 3.92 3.87 - 5.11 MIL/uL   Hemoglobin 11.5 (L) 12.0 - 15.0 g/dL   HCT 56.2 (L) 13.0 - 86.5 %   MCV 86.7 80.0 - 100.0 fL   MCH 29.3 26.0 - 34.0 pg   MCHC 33.8 30.0 - 36.0 g/dL   RDW 78.4 69.6 - 29.5 %  Platelets 229 150 - 400 K/uL   nRBC 0.0 0.0 - 0.2 %    Comment: Performed at [facility], [address] [place], [place] [address]  Glucose, capillary     Status: None   Collection Time: 07/13/23  2:35 AM  Result Value Ref Range   Glucose-Capillary 81 70 - 99 mg/dL    Comment: Glucose reference range applies only to samples taken after fasting for at least 8 hours.  Glucose, capillary     Status: None   Collection Time: 07/13/23  4:07 AM  Result Value Ref Range   Glucose-Capillary 91 70 - 99 mg/dL    Comment: Glucose reference range applies only to samples taken after fasting for at least 8 hours.  CBC     Status: Abnormal   Collection Time: 07/13/23  6:31 AM  Result Value Ref Range   WBC 7.9 4.0 - 10.5 K/uL   RBC 3.55 (L) 3.87 - 5.11 MIL/uL   Hemoglobin 10.5 (L) 12.0 - 15.0 g/dL   HCT 60.4 (L) 54.0 - 98.1 %   MCV 88.5 80.0 - 100.0 fL   MCH 29.6 26.0 - 34.0 pg   MCHC 33.4 30.0 - 36.0 g/dL   RDW 19.1 47.8 - 29.5 %   Platelets 186 150 - 400 K/uL   nRBC 0.0 0.0 - 0.2 %    Comment: Performed at [facility], [address], [place], [place] [address]  Creatinine, serum     Status: Abnormal   Collection Time: 07/13/23  6:31 AM  Result Value Ref Range   Creatinine, Ser 1.33 (H) 0.44 - 1.00 mg/dL   GFR, Estimated 43 (L) >60 mL/min    Comment: (NOTE) Calculated using the CKD-EPI Creatinine Equation (2021) Performed at [facility], [address] [place], [place] [address]   Hemoglobin A1c     Status: None   Collection Time: 07/13/23  6:31 AM  Result Value Ref Range   Hgb A1c MFr Bld 5.6 4.8 - 5.6 %    Comment: (NOTE) Pre diabetes:          5.7%-6.4%  Diabetes:              >6.4%  Glycemic control  for   <7.0% adults with diabetes    Mean Plasma Glucose 114.02 mg/dL    Comment: Performed at [facility], [address], [place], [place] [address]  Glucose, capillary     Status: None   Collection Time: 07/13/23  8:08 AM  Result Value Ref Range   Glucose-Capillary 80 70 - 99 mg/dL    Comment: Glucose reference range applies only to samples taken after fasting for at least 8 hours.    DG Abdomen 1 View Result Date: 07/13/2023 CLINICAL DATA:  NG tube EXAM: ABDOMEN - 1 VIEW COMPARISON:  None Available. FINDINGS: Nasogastric tube tip is in the body of the stomach. IMPRESSION: Nasogastric tube tip is in the body of the stomach. Electronically Signed   By: Darliss Cheney M.D.   On: 07/13/2023 01:21   CT ABDOMEN PELVIS W CONTRAST Result Date: 07/12/2023 CLINICAL DATA:  Lower abdominal pain, nausea, and vomiting. EXAM: CT ABDOMEN AND PELVIS WITH CONTRAST TECHNIQUE: Multidetector CT imaging of the abdomen and pelvis was performed using the standard protocol following bolus administration of intravenous contrast. RADIATION DOSE REDUCTION: This exam was performed according to the departmental dose-optimization program which includes automated exposure control, adjustment of the mA and/or kV according to patient  size and/or use of iterative reconstruction technique. CONTRAST:  OMNIPAQUE IOHEXOL 300 MG/ML  SOLN COMPARISON:  CTs with IV contrast 03/19/2023 and 11/20/2021 FINDINGS: Lower chest: Evidence of prior median sternotomy. Lung bases are clear. The cardiac size is normal. Atherosclerosis descending thoracic aorta. Hepatobiliary: No focal liver abnormality is seen. No gallstones, gallbladder wall thickening, or biliary dilatation. Pancreas: No abnormality. Spleen: Normal in size without focal abnormality. Adrenals/Urinary Tract: Adrenal glands are unremarkable. Kidneys are normal, without renal calculi, focal lesion, or hydronephrosis. Bladder is unremarkable where visible but partially obscured  on the right due to metal artifact from right hip replacement. Stomach/Bowel: There is mild dilatation of the stomach with fluid. The duodenum and jejunum are normal caliber but there are multiple dilated proximal to mid ileal small bowel segments measuring up to 3.8 cm. The transition from dilated to decompressed caliber appears to be anteriorly at the level of the umbilicus were there are multiple small bowel segments abutting the abdominal wall compatible with adhesive disease. Below this level the mid to distal ileal small bowel is collapsed. The degree of caliber difference is consistent with an intermediate to high-grade obstruction. The appendix is normal. There is mild to moderate fecal stasis. The large bowel wall normal in thickness. Vascular/Lymphatic: Aortic atherosclerosis. No enlarged abdominal or pelvic lymph nodes. Patent stenting again noted of the right common iliac artery. Reproductive: Uterus and bilateral adnexa are unremarkable. Other: Trace pelvic ascites. No mesenteric edema. No incarcerated hernia. No free air, free hemorrhage or pneumatosis. Musculoskeletal: Osteopenia and mild degenerative changes of the lumbar spine. Right hip replacement. There is a progressively worsening sizable disc protrusion at L4-5 increasingly compromising the spinal canal with likely mass effect on both descending L5 nerve roots. IMPRESSION: 1. Intermediate to high-grade, approximately mid ileal small bowel obstruction with transition point anteriorly at the level of the umbilicus, where there are multiple small bowel segments abutting the abdominal wall compatible with adhesive disease. No pneumatosis. 2. Trace pelvic ascites. 3. Constipation. 4. Aortic atherosclerosis. 5. Progressively enlarging disc protrusion at L4-5 increasingly compromising the spinal canal with likely mass effect on both descending L5 nerve roots. 6. Osteopenia and degenerative change. 7. These results will be called to the ordering  clinician or representative by the Radiologist Assistant, and communication documented in the PACS or [employer]. Aortic Atherosclerosis (ICD10-I70.0). Electronically Signed   By: Almira Bar M.D.   On: 07/12/2023 21:37    Review of Systems  Gastrointestinal:  Positive for abdominal pain, nausea and vomiting.  All other systems reviewed and are negative.  Blood pressure (!) 119/40, pulse 77, temperature 98.6 F (37 C), temperature source Oral, resp. rate 16, height 4\' 10"  (1.473 m), weight 65.4 kg, last menstrual period 05/07/2005, SpO2 94%. Physical Exam Vitals reviewed.  Constitutional:      Appearance: She is well-developed.  Eyes:     General: No scleral icterus.    Pupils: Pupils are equal, round, and reactive to light.  Cardiovascular:     Rate and Rhythm: Normal rate and regular rhythm.  Pulmonary:     Effort: Pulmonary effort is normal.     Breath sounds: No wheezing.  Abdominal:     General: There is distension (mild).     Palpations: Abdomen is soft.     Tenderness: There is no abdominal tenderness.     Hernia: No hernia is present.     Comments: Well healed midline incision  Skin:    General: Skin is warm and dry.  Capillary Refill: Capillary refill takes less than 2 seconds.  Neurological:     General: No focal deficit present.     Mental Status: She is alert.  Psychiatric:        Mood and Affect: Mood normal.        Behavior: Behavior normal.     Assessment/Plan: pSBO -hopefully resolving already as she has flatus -no indication for surgery -will do protocol today -fine for pharm dvt prophylaxis  I reviewed hospitalist notes, last 24 h vitals and pain scores, last 48 h intake and output, last 24 h labs and trends, and last 24 h imaging results. Discussed with er provider, reviewed ct which seems to show sbo  Emelia Loron 07/13/2023, 9:28 AM

## 2023-07-13 NOTE — Hospital Course (Signed)
 68 y.o. female with medical history significant of  emphysema, obesity, hyperlipidemia, diabetes, peripheral artery disease, previous small bowel obstructions, diverticulitis, Hypertension, OSA as well as tobacco abuse who presents to ED with abdominal pain and dx of SBO. Patient stated around 1:30 pm after eating lunch on 3/7 she began to experience severe abdominal pain. She states it was so severe she f/u with MD who ordered CTAB  s/p which she was  referred to ED after CT noted small bowel obstruction.

## 2023-07-14 DIAGNOSIS — K56609 Unspecified intestinal obstruction, unspecified as to partial versus complete obstruction: Secondary | ICD-10-CM | POA: Diagnosis not present

## 2023-07-14 LAB — COMPREHENSIVE METABOLIC PANEL
ALT: 10 U/L (ref 0–44)
AST: 12 U/L — ABNORMAL LOW (ref 15–41)
Albumin: 2.5 g/dL — ABNORMAL LOW (ref 3.5–5.0)
Alkaline Phosphatase: 96 U/L (ref 38–126)
Anion gap: 7 (ref 5–15)
BUN: 10 mg/dL (ref 8–23)
CO2: 24 mmol/L (ref 22–32)
Calcium: 8.1 mg/dL — ABNORMAL LOW (ref 8.9–10.3)
Chloride: 110 mmol/L (ref 98–111)
Creatinine, Ser: 1.04 mg/dL — ABNORMAL HIGH (ref 0.44–1.00)
GFR, Estimated: 58 mL/min — ABNORMAL LOW (ref >60)
Glucose, Bld: 77 mg/dL (ref 70–99)
Potassium: 3.8 mmol/L (ref 3.5–5.1)
Sodium: 141 mmol/L (ref 135–145)
Total Bilirubin: 0.4 mg/dL (ref 0.0–1.2)
Total Protein: 4.6 g/dL — ABNORMAL LOW (ref 6.5–8.1)

## 2023-07-14 LAB — GLUCOSE, CAPILLARY
Glucose-Capillary: 71 mg/dL (ref 70–99)
Glucose-Capillary: 77 mg/dL (ref 70–99)
Glucose-Capillary: 81 mg/dL (ref 70–99)
Glucose-Capillary: 85 mg/dL (ref 70–99)
Glucose-Capillary: 86 mg/dL (ref 70–99)
Glucose-Capillary: 98 mg/dL (ref 70–99)

## 2023-07-14 LAB — CBC
HCT: 28.8 % — ABNORMAL LOW (ref 36.0–46.0)
Hemoglobin: 9.5 g/dL — ABNORMAL LOW (ref 12.0–15.0)
MCH: 29.2 pg (ref 26.0–34.0)
MCHC: 33 g/dL (ref 30.0–36.0)
MCV: 88.6 fL (ref 80.0–100.0)
Platelets: 166 10*3/uL (ref 150–400)
RBC: 3.25 MIL/uL — ABNORMAL LOW (ref 3.87–5.11)
RDW: 14.4 % (ref 11.5–15.5)
WBC: 5.8 10*3/uL (ref 4.0–10.5)
nRBC: 0 % (ref 0.0–0.2)

## 2023-07-14 NOTE — Plan of Care (Signed)
   Problem: Education: Goal: Knowledge of General Education information will improve Description: Including pain rating scale, medication(s)/side effects and non-pharmacologic comfort measures Outcome: Progressing   Problem: Activity: Goal: Risk for activity intolerance will decrease Outcome: Progressing   Problem: Nutrition: Goal: Adequate nutrition will be maintained Outcome: Progressing   Problem: Coping: Goal: Level of anxiety will decrease Outcome: Progressing

## 2023-07-14 NOTE — Plan of Care (Signed)

## 2023-07-14 NOTE — Progress Notes (Signed)
 Subjective/Chief Complaint: Having bowel function   Objective: Vital signs in last 24 hours: Temp:  [98 F (36.7 C)-98.9 F (37.2 C)] 98.2 F (36.8 C) (03/09 0817) Pulse Rate:  [76-86] 80 (03/09 0817) Resp:  [15-17] 17 (03/09 0817) BP: (108-130)/(37-52) 130/43 (03/09 0817) SpO2:  [92 %-96 %] 92 % (03/09 0817) Last BM Date : 07/13/23  Intake/Output from previous day: 03/08 0701 - 03/09 0700 In: 878.9 [I.V.:788.9; NG/GT:90] Out: 703 [Urine:2; Emesis/NG output:700; Stool:1] Intake/Output this shift: No intake/output data recorded.  Ab soft nontender  Lab Results:  Recent Labs    07/12/23 2257 07/13/23 0631  WBC 9.0 7.9  HGB 11.5* 10.5*  HCT 34.0* 31.4*  PLT 229 186   BMET Recent Labs    07/12/23 2257 07/13/23 0631  NA 136  --   K 4.1  --   CL 101  --   CO2 28  --   GLUCOSE 110*  --   BUN 13  --   CREATININE 1.09* 1.33*  CALCIUM 9.8  --    PT/INR No results for input(s): "LABPROT", "INR" in the last 72 hours. ABG No results for input(s): "PHART", "HCO3" in the last 72 hours.  Invalid input(s): "PCO2", "PO2"  Studies/Results: DG Abd Portable 1V-Small Bowel Obstruction Protocol-initial, 8 hr delay Result Date: 07/13/2023 CLINICAL DATA:  Possible bowel obstruction. EXAM: PORTABLE ABDOMEN - 1 VIEW COMPARISON:  Radiograph of same day.  CT scan of July 12, 2023. FINDINGS: No abnormal bowel dilatation is noted currently. Residual contrast is noted in nondilated colon. Nasogastric tube tip is seen in proximal stomach. IMPRESSION: No abnormal bowel dilatation. Electronically Signed   By: Lupita Raider M.D.   On: 07/13/2023 19:57   DG Abdomen 1 View Result Date: 07/13/2023 CLINICAL DATA:  NG tube EXAM: ABDOMEN - 1 VIEW COMPARISON:  None Available. FINDINGS: Nasogastric tube tip is in the body of the stomach. IMPRESSION: Nasogastric tube tip is in the body of the stomach. Electronically Signed   By: Darliss Cheney M.D.   On: 07/13/2023 01:21   CT ABDOMEN PELVIS W  CONTRAST Result Date: 07/12/2023 CLINICAL DATA:  Lower abdominal pain, nausea, and vomiting. EXAM: CT ABDOMEN AND PELVIS WITH CONTRAST TECHNIQUE: Multidetector CT imaging of the abdomen and pelvis was performed using the standard protocol following bolus administration of intravenous contrast. RADIATION DOSE REDUCTION: This exam was performed according to the departmental dose-optimization program which includes automated exposure control, adjustment of the mA and/or kV according to patient size and/or use of iterative reconstruction technique. CONTRAST:  OMNIPAQUE IOHEXOL 300 MG/ML  SOLN COMPARISON:  CTs with IV contrast 03/19/2023 and 11/20/2021 FINDINGS: Lower chest: Evidence of prior median sternotomy. Lung bases are clear. The cardiac size is normal. Atherosclerosis descending thoracic aorta. Hepatobiliary: No focal liver abnormality is seen. No gallstones, gallbladder wall thickening, or biliary dilatation. Pancreas: No abnormality. Spleen: Normal in size without focal abnormality. Adrenals/Urinary Tract: Adrenal glands are unremarkable. Kidneys are normal, without renal calculi, focal lesion, or hydronephrosis. Bladder is unremarkable where visible but partially obscured on the right due to metal artifact from right hip replacement. Stomach/Bowel: There is mild dilatation of the stomach with fluid. The duodenum and jejunum are normal caliber but there are multiple dilated proximal to mid ileal small bowel segments measuring up to 3.8 cm. The transition from dilated to decompressed caliber appears to be anteriorly at the level of the umbilicus were there are multiple small bowel segments abutting the abdominal wall compatible with adhesive disease.  Below this level the mid to distal ileal small bowel is collapsed. The degree of caliber difference is consistent with an intermediate to high-grade obstruction. The appendix is normal. There is mild to moderate fecal stasis. The large bowel wall normal in  thickness. Vascular/Lymphatic: Aortic atherosclerosis. No enlarged abdominal or pelvic lymph nodes. Patent stenting again noted of the right common iliac artery. Reproductive: Uterus and bilateral adnexa are unremarkable. Other: Trace pelvic ascites. No mesenteric edema. No incarcerated hernia. No free air, free hemorrhage or pneumatosis. Musculoskeletal: Osteopenia and mild degenerative changes of the lumbar spine. Right hip replacement. There is a progressively worsening sizable disc protrusion at L4-5 increasingly compromising the spinal canal with likely mass effect on both descending L5 nerve roots. IMPRESSION: 1. Intermediate to high-grade, approximately mid ileal small bowel obstruction with transition point anteriorly at the level of the umbilicus, where there are multiple small bowel segments abutting the abdominal wall compatible with adhesive disease. No pneumatosis. 2. Trace pelvic ascites. 3. Constipation. 4. Aortic atherosclerosis. 5. Progressively enlarging disc protrusion at L4-5 increasingly compromising the spinal canal with likely mass effect on both descending L5 nerve roots. 6. Osteopenia and degenerative change. 7. These results will be called to the ordering clinician or representative by the Radiologist Assistant, and communication documented in the PACS or [employer]. Aortic Atherosclerosis (ICD10-I70.0). Electronically Signed   By: Almira Bar M.D.   On: 07/12/2023 21:37    Anti-infectives: Anti-infectives (From admission, onward)    None       Assessment/Plan: SBO -resolved clinically and radiologically -dc ng tube, fulls, adat -can dc home tomorrow  I reviewed vitals, labs and xray showing contrast in colon.    Emelia Loron 07/14/2023

## 2023-07-14 NOTE — Progress Notes (Signed)
  Progress Note   Patient: Diane Giles EAV:[id number] DOB: 07-29-1955 DOA: 07/12/2023     1 DOS: the patient was seen and examined on 07/14/2023   Brief hospital course: 68 y.o. female with medical history significant of  emphysema, obesity, hyperlipidemia, diabetes, peripheral artery disease, previous small bowel obstructions, diverticulitis, Hypertension, OSA as well as tobacco abuse who presents to ED with abdominal pain and dx of SBO. Patient stated around 1:30 pm after eating lunch on 3/7 she began to experience severe abdominal pain. She states it was so severe she f/u with MD who ordered CTAB  s/p which she was  referred to ED after CT noted small bowel obstruction.  Assessment and Plan: SBO -history of recurrent sbo -CT scan :small bowelo bstruction with transition point anteriorly at the level of theumbilicus, where there are multiple small bowel segments abutting the abdominal wall compatible with adhesive disease.  -General Surgery following -Symptoms have largely resolved -Surgery recs to advance diet. Possible d/c tomorrow if stable   COPD -not on controller medication  -prn nebs  -on minimal o2 support at this time   HLD -oral medications on hold    DMII -iss/fs -with low sliding scale    PVD -holding po medications currenlty    Hypertension -BP stable at this time -prn iv metoprolol  -resume oral medication once patient able to take po    OSA -plans to resume cpap once sbo improves       Subjective: Feeling better today. Eager to advance diet  Physical Exam: Vitals:   07/13/23 2329 07/14/23 0002 07/14/23 0817 07/14/23 1639  BP:  (!) 127/50 (!) 130/43 (!) 138/58  Pulse:  86 80 85  Resp:  15 17   Temp: 98.9 F (37.2 C)  98.2 F (36.8 C) 98.6 F (37 C)  TempSrc: Oral  Oral Oral  SpO2:  93% 92% 94%  Weight:      Height:       General exam: Conversant, in no acute distress Respiratory system: normal chest rise, clear, no audible  wheezing Cardiovascular system: regular rhythm, s1-s2 Gastrointestinal system: Nondistended, nontender, pos BS Central nervous system: No seizures, no tremors Extremities: No cyanosis, no joint deformities Skin: No rashes, no pallor Psychiatry: Affect normal // no auditory hallucinations   Data Reviewed:  Labs reviewed: Na 141, K 3.8, Cr 1.04, WBC 5.8, Hgb 9.5  Family Communication: Pt in room, family not at bedside  Disposition: Status is: Inpatient Remains inpatient appropriate because: severity of illness  Planned Discharge Destination: Home    Author: Rickey Barbara, MD 07/14/2023 5:26 PM  For on call review [email or web address].

## 2023-07-15 DIAGNOSIS — K56609 Unspecified intestinal obstruction, unspecified as to partial versus complete obstruction: Secondary | ICD-10-CM | POA: Diagnosis not present

## 2023-07-15 LAB — GLUCOSE, CAPILLARY
Glucose-Capillary: 73 mg/dL (ref 70–99)
Glucose-Capillary: 91 mg/dL (ref 70–99)
Glucose-Capillary: 92 mg/dL (ref 70–99)
Glucose-Capillary: 96 mg/dL (ref 70–99)

## 2023-07-15 NOTE — Discharge Summary (Signed)
 Physician Discharge Summary   Patient: Diane Giles MRN: [id number] DOB: 09-30-1955  Admit date:     07/12/2023  Discharge date: 07/15/23  Discharge Physician: Rickey Barbara   PCP: Theodis Shove, DO   Recommendations at discharge:    Follow up with PCP in 1-2 weeks  Discharge Diagnoses: Active Problems:   SBO (small bowel obstruction) (HCC)  Resolved Problems:   * No resolved hospital problems. *  Hospital Course: 68 y.o. female with medical history significant of  emphysema, obesity, hyperlipidemia, diabetes, peripheral artery disease, previous small bowel obstructions, diverticulitis, Hypertension, OSA as well as tobacco abuse who presents to ED with abdominal pain and dx of SBO. Patient stated around 1:30 pm after eating lunch on 3/7 she began to experience severe abdominal pain. She states it was so severe she f/u with MD who ordered CTAB  s/p which she was  referred to ED after CT noted small bowel obstruction.  Assessment and Plan: SBO -history of recurrent sbo -CT scan :small bowelo bstruction with transition point anteriorly at the level of theumbilicus, where there are multiple small bowel segments abutting the abdominal wall compatible with adhesive disease.  -General Surgery was following -Symptoms had resolved -Diet was successfully advanced, cleared for discharge today   COPD -not on controller medication  -prn nebs  -on minimal o2 support at this time   HLD -oral medications on hold    DMII -Was continued on SSI as needed -with low sliding scale    PVD -holding po medications currenlty    Hypertension -BP stable at this time -resume oral medication on d/c   OSA -plans to resume cpap once sbo improves       Consultants: General Surgery Procedures performed:   Disposition: Home Diet recommendation:  Regular diet DISCHARGE MEDICATION: Allergies as of 07/15/2023       Reactions   Codeine Itching        Medication List      TAKE these medications    allopurinol 100 MG tablet Commonly known as: ZYLOPRIM Take 1 tablet (100 mg total) by mouth daily for uric acid/gout   aspirin EC 81 MG tablet Take 1 tablet (81 mg total) by mouth 2 (two) times daily. To be taken after surgery   atenolol 25 MG tablet Commonly known as: TENORMIN Take 1 tablet (25 mg total) by mouth daily.   atorvastatin 80 MG tablet Commonly known as: LIPITOR Take 1 tablet (80 mg total) by mouth daily at 6 PM.   buPROPion 150 MG 24 hr tablet Commonly known as: WELLBUTRIN XL Take 1 tablet (150 mg total) by mouth in the morning for depression/anxiety   Calcium 600 1500 (600 Ca) MG Tabs tablet Generic drug: calcium carbonate Take 1 tablet by mouth daily.   Calcium Citrate-Vitamin D 315-5 MG-MCG Tabs Commonly known as: Calcium Citrate + Take 1 tablet by mouth daily.   cholecalciferol 25 MCG (1000 UNIT) tablet Commonly known as: VITAMIN D3 Take 1 tablet (25 mcg total) by mouth every morning.   clobetasol 0.05 % Gel Commonly known as: TEMOVATE Apply to vaginal area once daily   ezetimibe 10 MG tablet Commonly known as: ZETIA Take 1 tablet (10 mg total) by mouth daily.   Lancets Misc Use as directed once a day. (DX: R73.9)   losartan 50 MG tablet Commonly known as: COZAAR Take 1 tablet (50 mg total) by mouth daily for blood pressure. If running above 130/80, increase to 2 tablets daily as directed  magnesium oxide 400 (240 Mg) MG tablet Commonly known as: MAG-OX Take 400 mg by mouth daily.   magnesium oxide 400 MG tablet Commonly known as: MAG-OX TAKE ONE TABLET BY MOUTH EVERY EVENING   Ozempic (0.25 or 0.5 MG/DOSE) 2 MG/3ML Sopn Generic drug: Semaglutide(0.25 or 0.5MG /DOS) Inject 0.5 mg into the skin once a week. Pt takes on Mondays   pregabalin 75 MG capsule Commonly known as: LYRICA Take 1 capsule (75 mg total) by mouth 2 (two) times daily.   senna-docusate 8.6-50 MG tablet Commonly known as: Senokot-S Take 1  tablet by mouth daily.   Stool Softener 100 MG capsule Generic drug: docusate sodium Take 100 mg by mouth daily as needed.        Follow-up Information     Combs, Prince Solian, DO Follow up in 2 week(s).   Specialty: Geriatric Medicine Why: Hospital follow up Contact information: [address] [place] [address] [phone]                Discharge Exam: Ceasar Mons Weights   07/12/23 2242 07/13/23 0226  Weight: 63.5 kg 65.4 kg   General exam: Awake, laying in bed, in nad Respiratory system: Normal respiratory effort, no wheezing Cardiovascular system: regular rate, s1, s2 Gastrointestinal system: Soft, nondistended, positive BS Central nervous system: CN2-12 grossly intact, strength intact Extremities: Perfused, no clubbing Skin: Normal skin turgor, no notable skin lesions seen Psychiatry: Mood normal // no visual hallucinations   Condition at discharge: fair  The results of significant diagnostics from this hospitalization (including imaging, microbiology, ancillary and laboratory) are listed below for reference.   Imaging Studies: DG Abd Portable 1V-Small Bowel Obstruction Protocol-initial, 8 hr delay Result Date: 07/13/2023 CLINICAL DATA:  Possible bowel obstruction. EXAM: PORTABLE ABDOMEN - 1 VIEW COMPARISON:  Radiograph of same day.  CT scan of July 12, 2023. FINDINGS: No abnormal bowel dilatation is noted currently. Residual contrast is noted in nondilated colon. Nasogastric tube tip is seen in proximal stomach. IMPRESSION: No abnormal bowel dilatation. Electronically Signed   By: Lupita Raider M.D.   On: 07/13/2023 19:57   DG Abdomen 1 View Result Date: 07/13/2023 CLINICAL DATA:  NG tube EXAM: ABDOMEN - 1 VIEW COMPARISON:  None Available. FINDINGS: Nasogastric tube tip is in the body of the stomach. IMPRESSION: Nasogastric tube tip is in the body of the stomach. Electronically Signed   By: Darliss Cheney M.D.   On: 07/13/2023 01:21   CT ABDOMEN PELVIS  W CONTRAST Result Date: 07/12/2023 CLINICAL DATA:  Lower abdominal pain, nausea, and vomiting. EXAM: CT ABDOMEN AND PELVIS WITH CONTRAST TECHNIQUE: Multidetector CT imaging of the abdomen and pelvis was performed using the standard protocol following bolus administration of intravenous contrast. RADIATION DOSE REDUCTION: This exam was performed according to the departmental dose-optimization program which includes automated exposure control, adjustment of the mA and/or kV according to patient size and/or use of iterative reconstruction technique. CONTRAST:  OMNIPAQUE IOHEXOL 300 MG/ML  SOLN COMPARISON:  CTs with IV contrast 03/19/2023 and 11/20/2021 FINDINGS: Lower chest: Evidence of prior median sternotomy. Lung bases are clear. The cardiac size is normal. Atherosclerosis descending thoracic aorta. Hepatobiliary: No focal liver abnormality is seen. No gallstones, gallbladder wall thickening, or biliary dilatation. Pancreas: No abnormality. Spleen: Normal in size without focal abnormality. Adrenals/Urinary Tract: Adrenal glands are unremarkable. Kidneys are normal, without renal calculi, focal lesion, or hydronephrosis. Bladder is unremarkable where visible but partially obscured on the right due to metal artifact from right hip replacement.  Stomach/Bowel: There is mild dilatation of the stomach with fluid. The duodenum and jejunum are normal caliber but there are multiple dilated proximal to mid ileal small bowel segments measuring up to 3.8 cm. The transition from dilated to decompressed caliber appears to be anteriorly at the level of the umbilicus were there are multiple small bowel segments abutting the abdominal wall compatible with adhesive disease. Below this level the mid to distal ileal small bowel is collapsed. The degree of caliber difference is consistent with an intermediate to high-grade obstruction. The appendix is normal. There is mild to moderate fecal stasis. The large bowel wall normal in  thickness. Vascular/Lymphatic: Aortic atherosclerosis. No enlarged abdominal or pelvic lymph nodes. Patent stenting again noted of the right common iliac artery. Reproductive: Uterus and bilateral adnexa are unremarkable. Other: Trace pelvic ascites. No mesenteric edema. No incarcerated hernia. No free air, free hemorrhage or pneumatosis. Musculoskeletal: Osteopenia and mild degenerative changes of the lumbar spine. Right hip replacement. There is a progressively worsening sizable disc protrusion at L4-5 increasingly compromising the spinal canal with likely mass effect on both descending L5 nerve roots. IMPRESSION: 1. Intermediate to high-grade, approximately mid ileal small bowel obstruction with transition point anteriorly at the level of the umbilicus, where there are multiple small bowel segments abutting the abdominal wall compatible with adhesive disease. No pneumatosis. 2. Trace pelvic ascites. 3. Constipation. 4. Aortic atherosclerosis. 5. Progressively enlarging disc protrusion at L4-5 increasingly compromising the spinal canal with likely mass effect on both descending L5 nerve roots. 6. Osteopenia and degenerative change. 7. These results will be called to the ordering clinician or representative by the Radiologist Assistant, and communication documented in the PACS or [employer]. Aortic Atherosclerosis (ICD10-I70.0). Electronically Signed   By: Almira Bar M.D.   On: 07/12/2023 21:37    Microbiology: Results for orders placed or performed during the hospital encounter of 02/21/23  Surgical pcr screen     Status: None   Collection Time: 02/21/23  2:48 PM   Specimen: Nasal Mucosa; Nasal Swab  Result Value Ref Range Status   MRSA, PCR NEGATIVE NEGATIVE Final   Staphylococcus aureus NEGATIVE NEGATIVE Final    Comment: (NOTE) The Xpert SA Assay (FDA approved for NASAL specimens in patients 2 years of age and older), is one component of a comprehensive surveillance program. It is not  intended to diagnose infection nor to guide or monitor treatment. Performed at [facility], [address], [place], [place] [address]     Labs: CBC: Recent Labs  Lab 07/12/23 2257 07/13/23 0631 07/14/23 0834  WBC 9.0 7.9 5.8  HGB 11.5* 10.5* 9.5*  HCT 34.0* 31.4* 28.8*  MCV 86.7 88.5 88.6  PLT 229 186 166   Basic Metabolic Panel: Recent Labs  Lab 07/12/23 2257 07/13/23 0631 07/14/23 0834  NA 136  --  141  K 4.1  --  3.8  CL 101  --  110  CO2 28  --  24  GLUCOSE 110*  --  77  BUN 13  --  10  CREATININE 1.09* 1.33* 1.04*  CALCIUM 9.8  --  8.1*   Liver Function Tests: Recent Labs  Lab 07/12/23 2257 07/14/23 0834  AST 14* 12*  ALT 11 10  ALKPHOS 134* 96  BILITOT 0.5 0.4  PROT 6.3* 4.6*  ALBUMIN 4.1 2.5*   CBG: Recent Labs  Lab 07/14/23 2058 07/15/23 0017 07/15/23 0440 07/15/23 0601 07/15/23 0759  GLUCAP 86 96 73 91 92  Discharge time spent: less than 30 minutes.  Signed: Rickey Barbara, MD [facility] 07/15/2023

## 2023-07-15 NOTE — Progress Notes (Signed)
 Progress Note     Subjective: Pt reports some mild pain with eating but denies nausea or vomiting. Having bowel function. She would like to go home today. We discussed low fiber diet and that symptoms should gradually continue to improve.   Objective: Vital signs in last 24 hours: Temp:  [98 F (36.7 C)-98.6 F (37 C)] 98 F (36.7 C) (03/10 0439) Pulse Rate:  [70-85] 78 (03/10 0801) Resp:  [16-17] 17 (03/10 0801) BP: (130-146)/(41-62) 135/51 (03/10 0801) SpO2:  [92 %-99 %] 94 % (03/10 0801) Last BM Date : 07/13/23  Intake/Output from previous day: No intake/output data recorded. Intake/Output this shift: Total I/O In: 120 [P.O.:120] Out: -   PE: General: pleasant, WD, overweight female who is laying in bed in NAD Lungs: Respiratory effort nonlabored Abd: soft, NT, ND, no masses, hernias, or organomegaly Psych: A&Ox3 with an appropriate affect.    Lab Results:  Recent Labs    07/13/23 0631 07/14/23 0834  WBC 7.9 5.8  HGB 10.5* 9.5*  HCT 31.4* 28.8*  PLT 186 166   BMET Recent Labs    07/12/23 2257 07/13/23 0631 07/14/23 0834  NA 136  --  141  K 4.1  --  3.8  CL 101  --  110  CO2 28  --  24  GLUCOSE 110*  --  77  BUN 13  --  10  CREATININE 1.09* 1.33* 1.04*  CALCIUM 9.8  --  8.1*   PT/INR No results for input(s): "LABPROT", "INR" in the last 72 hours. CMP     Component Value Date/Time   NA 141 07/14/2023 0834   NA 136 01/05/2021 1649   K 3.8 07/14/2023 0834   CL 110 07/14/2023 0834   CO2 24 07/14/2023 0834   GLUCOSE 77 07/14/2023 0834   BUN 10 07/14/2023 0834   BUN 24 01/05/2021 1649   CREATININE 1.04 (H) 07/14/2023 0834   CREATININE 0.75 10/27/2015 0850   CALCIUM 8.1 (L) 07/14/2023 0834   PROT 4.6 (L) 07/14/2023 0834   PROT 6.9 03/21/2018 0844   ALBUMIN 2.5 (L) 07/14/2023 0834   ALBUMIN 4.2 03/21/2018 0844   AST 12 (L) 07/14/2023 0834   ALT 10 07/14/2023 0834   ALKPHOS 96 07/14/2023 0834   BILITOT 0.4 07/14/2023 0834   BILITOT 0.4  03/21/2018 0844   GFRNONAA 58 (L) 07/14/2023 0834   GFRNONAA >89 05/14/2014 1409   GFRAA 57 (L) 05/19/2019 0428   GFRAA >89 05/14/2014 1409   Lipase     Component Value Date/Time   LIPASE 31 07/12/2023 2257       Studies/Results: DG Abd Portable 1V-Small Bowel Obstruction Protocol-initial, 8 hr delay Result Date: 07/13/2023 CLINICAL DATA:  Possible bowel obstruction. EXAM: PORTABLE ABDOMEN - 1 VIEW COMPARISON:  Radiograph of same day.  CT scan of July 12, 2023. FINDINGS: No abnormal bowel dilatation is noted currently. Residual contrast is noted in nondilated colon. Nasogastric tube tip is seen in proximal stomach. IMPRESSION: No abnormal bowel dilatation. Electronically Signed   By: Lupita Raider M.D.   On: 07/13/2023 19:57    Anti-infectives: Anti-infectives (From admission, onward)    None        Assessment/Plan  SBO - tolerating soft diet and having bowel function   - contrast in colon on 8h delay KUB - stable for DC from surgery standpoint, no indication for surgical intervention   FEN: soft diet  VTE: SQH ID: none   LOS: 2 days   I reviewed  hospitalist notes, last 24 h vitals and pain scores, last 48 h intake and output, last 24 h labs and trends, and last 24 h imaging results.  This care required moderate level of medical decision making.    Juliet Rude, [facility] 07/15/2023, 8:05 AM Please see Amion for pager number during day hours 7:00am-4:30pm

## 2023-07-22 ENCOUNTER — Other Ambulatory Visit (HOSPITAL_BASED_OUTPATIENT_CLINIC_OR_DEPARTMENT_OTHER): Payer: Self-pay

## 2023-07-22 ENCOUNTER — Other Ambulatory Visit: Payer: Self-pay

## 2023-07-22 MED FILL — Ezetimibe Tab 10 MG: ORAL | 30 days supply | Qty: 30 | Fill #3 | Status: CN

## 2023-07-23 ENCOUNTER — Other Ambulatory Visit (HOSPITAL_BASED_OUTPATIENT_CLINIC_OR_DEPARTMENT_OTHER): Payer: Self-pay

## 2023-07-24 ENCOUNTER — Other Ambulatory Visit: Payer: Self-pay

## 2023-07-24 MED FILL — Ezetimibe Tab 10 MG: ORAL | 30 days supply | Qty: 30 | Fill #3 | Status: AC

## 2023-08-03 ENCOUNTER — Other Ambulatory Visit (HOSPITAL_BASED_OUTPATIENT_CLINIC_OR_DEPARTMENT_OTHER): Payer: Self-pay

## 2023-08-08 ENCOUNTER — Other Ambulatory Visit (HOSPITAL_BASED_OUTPATIENT_CLINIC_OR_DEPARTMENT_OTHER): Payer: Self-pay

## 2023-08-08 ENCOUNTER — Other Ambulatory Visit: Payer: Self-pay

## 2023-08-09 ENCOUNTER — Other Ambulatory Visit (HOSPITAL_BASED_OUTPATIENT_CLINIC_OR_DEPARTMENT_OTHER): Payer: Self-pay

## 2023-08-09 MED ORDER — LOSARTAN POTASSIUM 50 MG PO TABS
50.0000 mg | ORAL_TABLET | Freq: Every day | ORAL | 1 refills | Status: DC
  Filled 2023-08-09 – 2023-08-12 (×2): qty 90, 90d supply, fill #0
  Filled 2023-08-21: qty 15, 15d supply, fill #0
  Filled 2023-08-27: qty 15, 15d supply, fill #1
  Filled 2023-09-03: qty 30, 30d supply, fill #1
  Filled 2023-09-09 – 2023-09-10 (×3): qty 15, 15d supply, fill #1
  Filled 2023-09-12 – 2023-09-14 (×2): qty 15, 15d supply, fill #2
  Filled 2023-09-21: qty 90, 90d supply, fill #2
  Filled ????-??-??: fill #0

## 2023-08-12 ENCOUNTER — Other Ambulatory Visit (HOSPITAL_BASED_OUTPATIENT_CLINIC_OR_DEPARTMENT_OTHER): Payer: Self-pay

## 2023-08-12 ENCOUNTER — Other Ambulatory Visit: Payer: Self-pay

## 2023-08-12 MED FILL — Ezetimibe Tab 10 MG: ORAL | 30 days supply | Qty: 30 | Fill #4 | Status: CN

## 2023-08-14 ENCOUNTER — Other Ambulatory Visit (HOSPITAL_BASED_OUTPATIENT_CLINIC_OR_DEPARTMENT_OTHER): Payer: Self-pay

## 2023-08-14 ENCOUNTER — Other Ambulatory Visit: Payer: Self-pay

## 2023-08-14 ENCOUNTER — Other Ambulatory Visit (HOSPITAL_COMMUNITY): Payer: Self-pay

## 2023-08-14 MED ORDER — CITRACAL SLOW RELEASE 600-40-500 MG-MG-UNIT PO TB24
2.0000 | ORAL_TABLET | Freq: Every day | ORAL | 4 refills | Status: DC
Start: 2023-08-14 — End: 2023-10-14
  Filled 2023-09-03 – 2023-09-09 (×2): qty 160, 80d supply, fill #0
  Filled 2023-09-10: qty 240, fill #0
  Filled ????-??-??: fill #0

## 2023-08-15 ENCOUNTER — Other Ambulatory Visit (HOSPITAL_BASED_OUTPATIENT_CLINIC_OR_DEPARTMENT_OTHER): Payer: Self-pay

## 2023-08-15 ENCOUNTER — Other Ambulatory Visit: Payer: Self-pay

## 2023-08-16 ENCOUNTER — Other Ambulatory Visit: Payer: Self-pay

## 2023-08-16 ENCOUNTER — Other Ambulatory Visit (HOSPITAL_BASED_OUTPATIENT_CLINIC_OR_DEPARTMENT_OTHER): Payer: Self-pay

## 2023-08-20 ENCOUNTER — Other Ambulatory Visit (HOSPITAL_BASED_OUTPATIENT_CLINIC_OR_DEPARTMENT_OTHER): Payer: Self-pay

## 2023-08-20 ENCOUNTER — Other Ambulatory Visit: Payer: Self-pay

## 2023-08-21 ENCOUNTER — Other Ambulatory Visit (HOSPITAL_BASED_OUTPATIENT_CLINIC_OR_DEPARTMENT_OTHER): Payer: Self-pay

## 2023-08-21 ENCOUNTER — Other Ambulatory Visit: Payer: Self-pay

## 2023-08-22 ENCOUNTER — Other Ambulatory Visit (HOSPITAL_BASED_OUTPATIENT_CLINIC_OR_DEPARTMENT_OTHER): Payer: Self-pay

## 2023-08-23 ENCOUNTER — Other Ambulatory Visit (HOSPITAL_BASED_OUTPATIENT_CLINIC_OR_DEPARTMENT_OTHER): Payer: Self-pay

## 2023-08-27 ENCOUNTER — Other Ambulatory Visit: Payer: Self-pay

## 2023-08-27 ENCOUNTER — Ambulatory Visit (INDEPENDENT_AMBULATORY_CARE_PROVIDER_SITE_OTHER): Payer: Medicare Other | Admitting: Physician Assistant

## 2023-08-27 ENCOUNTER — Other Ambulatory Visit (INDEPENDENT_AMBULATORY_CARE_PROVIDER_SITE_OTHER)

## 2023-08-27 DIAGNOSIS — Z96652 Presence of left artificial knee joint: Secondary | ICD-10-CM

## 2023-08-27 MED FILL — Ezetimibe Tab 10 MG: ORAL | 30 days supply | Qty: 30 | Fill #4 | Status: CN

## 2023-08-27 NOTE — Progress Notes (Signed)
 Post-Op Visit Note   Patient: Diane Giles           Date of Birth: Apr 16, 1956           MRN: [id number] Visit Date: 08/27/2023 PCP: Augustus Blood, DO   Assessment & Plan:  Chief Complaint:  Chief Complaint  Patient presents with   Left Knee - Pain, Follow-up   Visit Diagnoses:  1. Status post total left knee replacement     Plan: Patient is a pleasant 68 year old female who comes in today 6 months status post left total knee replacement 03/04/2023.  She has been doing great.  No complaints.  Examination of the left knee reveals range of motion of 0 to 120 degrees.  Stable to valgus varus stress.  She is neurovascularly intact distally.  At this point, she will continue to advance with activity as tolerated.  Dental prophylaxis reinforced.  Follow-up in 6 months for repeat evaluation and 2 view x-rays of the left knee.  Call with concerns or questions.  Follow-Up Instructions: Return in about 6 months (around 02/26/2024).   Orders:  Orders Placed This Encounter  Procedures   XR Knee 1-2 Views Left   No orders of the defined types were placed in this encounter.   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   Aortoiliac occlusive disease (HCC) 12/03/2016   Centrilobular emphysema (HCC) 10/03/2016   Right groin pain 02/27/2016   Sacroiliitis (HCC) 02/27/2016   Postmenopausal bleeding 12/01/2015   Psoas tendinitis  of right side 10/21/2015   Endometrial thickening on ultrasound 07/28/2015   S/P right THA, 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   Type 2 diabetes mellitus (HCC) 05/18/2014   DDD (degenerative disc disease) 03/18/2013   Metabolic syndrome 07/29/2012   Essential hypertension, benign 07/18/2012   Hyperlipidemia 07/18/2012   Obstructive sleep apnea 07/04/2012   Osteoporosis 07/04/2012   Morbid obesity (HCC) 07/04/2012   Past Medical History:  Diagnosis Date   Aortic stenosis    mild-moderate AS 04/22/19 echo   Aortoiliac occlusive disease (HCC) 12/03/2016   Arthritis    Diabetes mellitus without complication (HCC)    pt states she has been told she is no longer diabetic and is not taking meds   Diverticulitis    Gout    Heart murmur    Hyperlipidemia    Hypertension    Osteoporosis    Sleep apnea    Tobacco use disorder 07/04/2012    Family History  Problem Relation Age of Onset   Migraines Mother    Thyroid disease Mother        hypothyroid   Cancer Father 22       Dec age 39 with pancreatic Ca    Past Surgical History:  Procedure Laterality Date   ABDOMINAL AORTOGRAM W/LOWER EXTREMITY N/A 12/03/2016   Procedure: Abdominal Aortogram w/Lower Extremity;  Surgeon: Dannis Dy, MD;  Location: [facility] INVASIVE CV LAB;  Service: Cardiovascular;  Laterality: N/A;   AORTA -INNOMIATE  BYPASS N/A 05/18/2019   Procedure: AORTA -INNOMIATE BYPASS Using Hemashield Gold Graft Size 8mm;  Surgeon: Bartley Lightning, MD;  Location: [facility] OR;  Service: Open Heart Surgery;  Laterality: N/A;   aorto- fem bypass Bilateral    aortobiiliac bypass in 2000   DILATATION & CURETTAGE/HYSTEROSCOPY WITH MYOSURE N/A 12/27/2015   Procedure: DILATATION & CURETTAGE/HYSTEROSCOPY;  Surgeon: Greta Leatherwood, MD;  Location: [facility] ORS;  Service: Gynecology;  Laterality: N/A;   DILATION AND CURETTAGE OF UTERUS  11/2015   ELBOW SURGERY Left ~2007   tendon repair by Dr. Brunilda Capra   EYE SURGERY Bilateral 2008   lasik   JOINT  REPLACEMENT     lower aortic bypass  2000   per patient   PERIPHERAL VASCULAR INTERVENTION  12/03/2016   Stenting of anastomotic stenosis of the aortoiliac and right common iliac artery with 7 x 39 mm VBX covered stent and postdilated with 9 mm balloon.   Right innonimate bypass  05/18/2019   Aorto innominate bypass surgery with median sternotomy   STERNOTOMY  05/18/2019   Right aorto-innonimate bypass surgery 05/18/19 Dr. Sherene Dilling   TOTAL HIP ARTHROPLASTY Right 07/27/2014   Procedure: RIGHT TOTAL HIP ARTHROPLASTY ANTERIOR APPROACH;  Surgeon: Claiborne Crew, MD;  Location: [facility] ORS;  Service: Orthopedics;  Laterality: Right;   TOTAL KNEE ARTHROPLASTY Right 05/28/2022   Procedure: RIGHT TOTAL KNEE ARTHROPLASTY;  Surgeon: Wes Hamman, MD;  Location: [facility] OR;  Service: Orthopedics;  Laterality: Right;   TOTAL KNEE ARTHROPLASTY Left 03/04/2023   Procedure: TOTAL KNEE ARTHROPLASTY;  Surgeon: Wes Hamman, MD;  Location: [facility] OR;  Service: Orthopedics;  Laterality: Left;   Social History   Occupational History   Occupation: Tree surgeon  Tobacco Use   Smoking status: Former    Current packs/day: 0.00    Average packs/day: 0.5 packs/day for 40.0 years (20.0 ttl pk-yrs)    Types: Cigarettes    Start date: 02/23/1977    Quit date: 02/23/2017    Years since quitting: 6.5   Smokeless tobacco: Never  Vaping Use   Vaping status: Never Used  Substance and Sexual Activity   Alcohol use: Yes    Comment: very rare--maybe once a year   Drug use: No   Sexual activity: Yes    Partners: Male    Birth control/protection: Post-menopausal

## 2023-08-28 ENCOUNTER — Other Ambulatory Visit: Payer: Self-pay

## 2023-08-29 ENCOUNTER — Other Ambulatory Visit (HOSPITAL_BASED_OUTPATIENT_CLINIC_OR_DEPARTMENT_OTHER): Payer: Self-pay

## 2023-09-03 ENCOUNTER — Other Ambulatory Visit: Payer: Self-pay

## 2023-09-03 ENCOUNTER — Other Ambulatory Visit (HOSPITAL_COMMUNITY): Payer: Self-pay

## 2023-09-03 MED FILL — Ezetimibe Tab 10 MG: ORAL | 30 days supply | Qty: 30 | Fill #4 | Status: CN

## 2023-09-04 ENCOUNTER — Other Ambulatory Visit (HOSPITAL_BASED_OUTPATIENT_CLINIC_OR_DEPARTMENT_OTHER): Payer: Self-pay

## 2023-09-04 ENCOUNTER — Other Ambulatory Visit: Payer: Self-pay

## 2023-09-04 ENCOUNTER — Other Ambulatory Visit (HOSPITAL_COMMUNITY): Payer: Self-pay

## 2023-09-05 ENCOUNTER — Other Ambulatory Visit: Payer: Self-pay

## 2023-09-05 ENCOUNTER — Other Ambulatory Visit (HOSPITAL_BASED_OUTPATIENT_CLINIC_OR_DEPARTMENT_OTHER): Payer: Self-pay

## 2023-09-05 ENCOUNTER — Other Ambulatory Visit (HOSPITAL_COMMUNITY): Payer: Self-pay

## 2023-09-06 ENCOUNTER — Other Ambulatory Visit: Payer: Self-pay

## 2023-09-09 ENCOUNTER — Other Ambulatory Visit (HOSPITAL_BASED_OUTPATIENT_CLINIC_OR_DEPARTMENT_OTHER): Payer: Self-pay

## 2023-09-09 ENCOUNTER — Other Ambulatory Visit (HOSPITAL_COMMUNITY): Payer: Self-pay

## 2023-09-09 ENCOUNTER — Other Ambulatory Visit: Payer: Self-pay

## 2023-09-09 MED FILL — Ezetimibe Tab 10 MG: ORAL | 30 days supply | Qty: 30 | Fill #4 | Status: CN

## 2023-09-10 ENCOUNTER — Other Ambulatory Visit: Payer: Self-pay

## 2023-09-10 ENCOUNTER — Other Ambulatory Visit (HOSPITAL_COMMUNITY): Payer: Self-pay

## 2023-09-10 MED FILL — Ezetimibe Tab 10 MG: ORAL | 30 days supply | Qty: 30 | Fill #4 | Status: AC

## 2023-09-10 MED FILL — Ezetimibe Tab 10 MG: ORAL | 30 days supply | Qty: 30 | Fill #4 | Status: CN

## 2023-09-12 ENCOUNTER — Other Ambulatory Visit (HOSPITAL_COMMUNITY): Payer: Self-pay

## 2023-09-13 ENCOUNTER — Other Ambulatory Visit: Payer: Self-pay

## 2023-09-13 ENCOUNTER — Ambulatory Visit (HOSPITAL_BASED_OUTPATIENT_CLINIC_OR_DEPARTMENT_OTHER): Admitting: Orthopaedic Surgery

## 2023-09-13 DIAGNOSIS — M5430 Sciatica, unspecified side: Secondary | ICD-10-CM

## 2023-09-13 NOTE — Progress Notes (Signed)
 Chief Complaint: Leg weakness     History of Present Illness:   09/13/2023; presents today overall doing quite well from a leg perspective.  She did get quite significant relief from her previous injection and she would like to proceed with this  Diane Giles is a 68 y.o. female with history of right knee pain now going on for approximately 15 years.  She states that the knee feels sore and achy all over.  She states that she attempts to be active and is walking more than a mile a day at the gym at [place] although it starts to buckle and be painful after approximately more than 1 month.  She states that she has significant difficulty going from sit to stand.  She has pain even while at rest while sitting.  She is working significantly on trying to lose weight that she did gain from COVID.  She has lost 25 pounds recently.  [facility].  These did not provide any type of significant relief.  She has taken over-the-counter anti-inflammatories with minimal relief as well.  She is here today seeking a more definitive solution.  She does have a history of diabetes with a last A1c of 6.3    Surgical History:   History of total hip arthroplasty done at [facility] many years prior  PMH/PSH/Family History/Social History/Meds/Allergies:    Past Medical History:  Diagnosis Date   Aortic stenosis    mild-moderate AS 04/22/19 echo   Aortoiliac occlusive disease (HCC) 12/03/2016   Arthritis    Diabetes mellitus without complication (HCC)    pt states she has been told she is no longer diabetic and is not taking meds   Diverticulitis    Gout    Heart murmur    Hyperlipidemia    Hypertension    Osteoporosis    Sleep apnea    Tobacco use disorder 07/04/2012   Past Surgical History:  Procedure Laterality Date   ABDOMINAL AORTOGRAM W/LOWER EXTREMITY N/A 12/03/2016   Procedure: Abdominal  Aortogram w/Lower Extremity;  Surgeon: Dannis Dy, MD;  Location: [facility] INVASIVE CV LAB;  Service: Cardiovascular;  Laterality: N/A;   AORTA -INNOMIATE BYPASS N/A 05/18/2019   Procedure: AORTA -INNOMIATE BYPASS Using Hemashield Gold Graft Size 8mm;  Surgeon: Bartley Lightning, MD;  Location: [facility] OR;  Service: Open Heart Surgery;  Laterality: N/A;   aorto- fem bypass Bilateral    aortobiiliac bypass in 2000   DILATATION & CURETTAGE/HYSTEROSCOPY WITH MYOSURE N/A 12/27/2015   Procedure: DILATATION & CURETTAGE/HYSTEROSCOPY;  Surgeon: Greta Leatherwood, MD;  Location: [facility] ORS;  Service: Gynecology;  Laterality: N/A;   DILATION AND CURETTAGE OF UTERUS  11/2015   ELBOW SURGERY Left ~2007   tendon repair by Dr. Brunilda Capra   EYE SURGERY Bilateral 2008   lasik   JOINT REPLACEMENT     lower aortic bypass  2000   per patient   PERIPHERAL VASCULAR INTERVENTION  12/03/2016   Stenting of anastomotic stenosis of the aortoiliac and right common iliac artery with 7 x 39 mm VBX covered stent and postdilated with 9 mm balloon.   Right innonimate bypass  05/18/2019   Aorto innominate bypass surgery with median sternotomy   STERNOTOMY  05/18/2019   Right aorto-innonimate bypass  surgery 05/18/19 Dr. Sherene Dilling   TOTAL HIP ARTHROPLASTY Right 07/27/2014   Procedure: RIGHT TOTAL HIP ARTHROPLASTY ANTERIOR APPROACH;  Surgeon: Claiborne Crew, MD;  Location: [facility] ORS;  Service: Orthopedics;  Laterality: Right;   TOTAL KNEE ARTHROPLASTY Right 05/28/2022   Procedure: RIGHT TOTAL KNEE ARTHROPLASTY;  Surgeon: Wes Hamman, MD;  Location: [facility] OR;  Service: Orthopedics;  Laterality: Right;   TOTAL KNEE ARTHROPLASTY Left 03/04/2023   Procedure: TOTAL KNEE ARTHROPLASTY;  Surgeon: Wes Hamman, MD;  Location: [facility] OR;  Service: Orthopedics;  Laterality: Left;   Social History   Socioeconomic History   Marital status: Married    Spouse name: Not on file   Number of children: 5   Years of education: Not on file   Highest  education level: Not on file  Occupational History   Occupation: Tree surgeon  Tobacco Use   Smoking status: Former    Current packs/day: 0.00    Average packs/day: 0.5 packs/day for 40.0 years (20.0 ttl pk-yrs)    Types: Cigarettes    Start date: 02/23/1977    Quit date: 02/23/2017    Years since quitting: 6.5   Smokeless tobacco: Never  Vaping Use   Vaping status: Never Used  Substance and Sexual Activity   Alcohol use: Yes    Comment: very rare--maybe once a year   Drug use: No   Sexual activity: Yes    Partners: Male    Birth control/protection: Post-menopausal  Other Topics Concern   Not on file  Social History Narrative   Married. Patient does not exercise. She has a Geographical information systems officer.   Right handed   Lives with husband in two story home   Social Drivers of Health   Financial Resource Strain: Not on file  Food Insecurity: No Food Insecurity (07/13/2023)   Hunger Vital Sign    Worried About Running Out of Food in the Last Year: Never true    Ran Out of Food in the Last Year: Never true  Transportation Needs: No Transportation Needs (07/13/2023)   PRAPARE - Administrator, Civil Service (Medical): No    Lack of Transportation (Non-Medical): No  Physical Activity: Not on file  Stress: Not on file  Social Connections: Unknown (07/13/2023)   Social Connection and Isolation Panel [NHANES]    Frequency of Communication with Friends and Family: More than three times a week    Frequency of Social Gatherings with Friends and Family: Three times a week    Attends Religious Services: Not on file    Active Member of Clubs or Organizations: Not on file    Attends Banker Meetings: Not on file    Marital Status: Married   Family History  Problem Relation Age of Onset   Migraines Mother    Thyroid disease Mother        hypothyroid   Cancer Father 33       Dec age 80 with pancreatic Ca   Allergies  Allergen Reactions   Codeine Itching   Current  Outpatient Medications  Medication Sig Dispense Refill   allopurinol  (ZYLOPRIM ) 100 MG tablet Take 1 tablet (100 mg total) by mouth daily for uric acid/gout 90 tablet 4   aspirin  EC 81 MG tablet Take 1 tablet (81 mg total) by mouth 2 (two) times daily. To be taken after surgery 84 tablet 0   atenolol  (TENORMIN ) 25 MG tablet Take 1 tablet (25 mg total) by mouth daily. 90 tablet 4  atorvastatin  (LIPITOR ) 80 MG tablet Take 1 tablet (80 mg total) by mouth daily at 6 PM. 90 tablet 4   buPROPion  (WELLBUTRIN  XL) 150 MG 24 hr tablet Take 1 tablet (150 mg total) by mouth in the morning for depression/anxiety 90 tablet 3   CALCIUM  600 1500 (600 Ca) MG TABS tablet Take 1 tablet by mouth daily.     Calcium -Magnesium -Vitamin D  (CITRACAL CALCIUM +D) 600-40-500 MG-MG-UNIT TB24 Take 2 tablets by mouth daily. 185 tablet 4   cholecalciferol  (VITAMIN D3) 25 MCG (1000 UNIT) tablet Take 1 tablet (25 mcg total) by mouth every morning. 90 tablet 4   clobetasol  (TEMOVATE ) 0.05 % GEL Apply to vaginal area once daily (Patient not taking: Reported on 07/13/2023) 90 g 4   ezetimibe  (ZETIA ) 10 MG tablet Take 1 tablet (10 mg total) by mouth daily. 90 tablet 3   Lancets MISC Use as directed once a day. (DX: R73.9) 100 each 2   losartan  (COZAAR ) 50 MG tablet Take 1 tablet (50 mg total) by mouth daily for blood pressure. If running above 130/80, increase to 2 tablets daily as directed 90 tablet 4   losartan  (COZAAR ) 50 MG tablet Take 1 tablet (50 mg total) by mouth daily. 90 tablet 1   magnesium  oxide (MAG-OX) 400 (240 Mg) MG tablet Take 400 mg by mouth daily.     magnesium  oxide (MAG-OX) 400 MG tablet Take 1 tablet (400 mg total) by mouth every evening. 90 tablet 4   OZEMPIC, 0.25 OR 0.5 MG/DOSE, 2 MG/3ML SOPN Inject 0.5 mg into the skin once a week. Pt takes on Mondays     pregabalin  (LYRICA ) 75 MG capsule Take 1 capsule (75 mg total) by mouth 2 (two) times daily. 180 capsule 2   senna-docusate (SENOKOT-S) 8.6-50 MG tablet Take 1  tablet by mouth daily. 15 tablet 0   STOOL SOFTENER 100 MG capsule Take 100 mg by mouth daily as needed.     No current facility-administered medications for this visit.   No results found.  Review of Systems:   A ROS was performed including pertinent positives and negatives as documented in the HPI.  Physical Exam :   Constitutional: NAD and appears stated age Neurological: Alert and oriented Psych: Appropriate affect and cooperative Last menstrual period 05/07/2005.   Comprehensive Musculoskeletal Exam:      Musculoskeletal Exam  Gait Normal  Alignment Normal   Right Left  Inspection Normal Normal  Palpation    Tenderness tricompartmental none  Crepitus positive None  Effusion Mild None  Range of Motion    Extension 0 0  Flexion 135 135  Strength    Extension 5/5 5/5  Flexion 5/5 5/5  Ligament Exam     Generalized Laxity No No  Lachman Negative Negative   Pivot Shift Negative Negative  Anterior Drawer Negative Negative  Valgus at 0 Negative Negative  Valgus at 20 Negative Negative  Varus at 0 0 0  Varus at 20   0 0  Posterior Drawer at 90 0 0  Vascular/Lymphatic Exam    Edema None None  Venous Stasis Changes No No  Distal Circulation Normal Normal  Neurologic    Light Touch Sensation Intact Intact  Special Tests:    Positive straight leg raise that is worse with forward flexion.  Imaging:   Xray (4 views right knee): Moderate to severe tricompartmental osteoarthritis  MRI, cervical thoracic and lumbar spine: She does appear to have a moderate disc herniation involving the L5-S1 disc space  I personally reviewed and interpreted the radiographs.   Assessment:   68 year old female with L5-S1 disc herniation.  At this time she is hoping to pursue an additional injection as well as this gave her quite significant relief.  She did not have a great experience with [facility] imaging previously as this was quite painful.  I will plan to refer her to Dr.  Daisey Dryer for discussion of this  Plan :    -Return to clinic as needed     I personally saw and evaluated the patient, and participated in the management and treatment plan.  Wilhelmenia Harada, MD Attending Physician, Orthopedic Surgery  This document was dictated using Dragon voice recognition software. A reasonable attempt at proof reading has been made to minimize errors.

## 2023-09-15 ENCOUNTER — Other Ambulatory Visit (HOSPITAL_COMMUNITY): Payer: Self-pay

## 2023-09-16 ENCOUNTER — Other Ambulatory Visit (HOSPITAL_COMMUNITY): Payer: Self-pay

## 2023-09-16 ENCOUNTER — Other Ambulatory Visit: Payer: Self-pay

## 2023-09-23 ENCOUNTER — Other Ambulatory Visit (HOSPITAL_COMMUNITY): Payer: Self-pay

## 2023-09-24 ENCOUNTER — Ambulatory Visit (INDEPENDENT_AMBULATORY_CARE_PROVIDER_SITE_OTHER): Admitting: Physical Medicine and Rehabilitation

## 2023-09-24 ENCOUNTER — Other Ambulatory Visit (HOSPITAL_COMMUNITY): Payer: Self-pay

## 2023-09-24 ENCOUNTER — Encounter: Payer: Self-pay | Admitting: Physical Medicine and Rehabilitation

## 2023-09-24 DIAGNOSIS — R202 Paresthesia of skin: Secondary | ICD-10-CM

## 2023-09-24 DIAGNOSIS — M5416 Radiculopathy, lumbar region: Secondary | ICD-10-CM

## 2023-09-24 DIAGNOSIS — M5116 Intervertebral disc disorders with radiculopathy, lumbar region: Secondary | ICD-10-CM | POA: Diagnosis not present

## 2023-09-24 MED ORDER — DIAZEPAM 5 MG PO TABS
ORAL_TABLET | ORAL | 0 refills | Status: DC
  Filled 2023-09-24 (×2): qty 1, 1d supply, fill #0

## 2023-09-24 NOTE — Progress Notes (Unsigned)
 Diane Giles - 68 y.o. female MRN [id number]  Date of birth: 20-Oct-1955  Office Visit Note: Visit Date: 09/24/2023 PCP: Augustus Blood, DO Referred by: Augustus Blood, *  Subjective: Chief Complaint  Patient presents with   Other    Upper back pain with bilateral leg weakness and giving way Prior ESI at [facility] 100/2024 was helpful until about 1 week ago   HPI: Diane Giles is a 68 y.o. female who comes in today per the request of Dr. Wilhelmenia Harada for evaluation of chronic, worsening and severe weakness and numbness to bilateral lower extremities. Symptoms ongoing for 20 plus years. She does not describe her symptoms as pain, no lower back discomfort. Reports intermittent upper back pain with activity. States her legs feel very weak, especially when attempting to go from sitting to standing position and with walking. She has tried home exercise regimen, rest and use of medications with minimal relief of pain. No history of formal physical therapy. Lumbar MRI imaging from 2024 shows central disc protrusion at L5-S1 displaces the traversing left S1 nerve root in the subarticular zone. No high grade spinal canal stenosis. Patient underwent left L5-S1 interlaminar epidural steroid injection at [facility] on 02/20/2023. She reports significant relief of pain with this injection until several weeks ago. Patient denies recent falls. No bowel/bladder incontinence.       Review of Systems  Neurological:  Positive for focal weakness.  All other systems reviewed and are negative.  Otherwise per HPI.  Assessment & Plan: Visit Diagnoses:    ICD-10-CM   1. Lumbar radiculopathy  M54.16     2. Intervertebral disc disorders with radiculopathy, lumbar region  M51.16     3. Paresthesia of skin  R20.2        Plan: Findings:  Chronic, worsening and severe weakness and numbness to bilateral lower extremities. Patient continues to have symptoms despite good conservative  therapies such as home exercise regimen, rest and use of medications. Patients clinical presentation and exam are complex. I also feel there is a myofascial component contributing to her symptoms as she does have myofascial tenderness to bilateral lumbar paraspinal regions. Significant relief of pain with previous lumbar epidural steroid injection in October of 2024. We discussed treatment plan in detail today. Next step is to perform diagnostic and hopefully therapeutic left L5-S1 interlaminar epidural steroid injection under fluoroscopic guidance. She is not currently taking anticoagulant medications. If good relief of pain with injection we can repeat this procedure infrequently as needed. Patient has no questions at this time. No red flag symptoms noted upon exam today.     Meds & Orders: No orders of the defined types were placed in this encounter.  No orders of the defined types were placed in this encounter.   Follow-up: Return for Left L5-S1 interlaminar epidural steroid injection.   Procedures: No procedures performed      Clinical History: MRI LUMBAR SPINE WITHOUT CONTRAST   TECHNIQUE: Multiplanar, multisequence MR imaging of the lumbar spine was performed. No intravenous contrast was administered.   COMPARISON:  Lumbar spine radiographs 07/04/2012.   FINDINGS: Segmentation: Conventional numbering is assumed with 5 non-rib-bearing, lumbar type vertebral bodies.   Alignment:  Normal.   Vertebrae:  Normal vertebral body heights and marrow signal.   Conus medullaris and cauda equina: Conus extends to the T12-L1 level. Conus and cauda equina appear normal.   Paraspinal and other soft tissues: Unremarkable.   Disc levels:   T12-L1:  Normal.   L1-L2:  Normal.   L2-L3:  Normal.   L3-L4:  Normal.   L4-L5: Disc bulge with central annular fissure. No spinal canal stenosis or neural foraminal narrowing.   L5-S1: Central disc protrusion displaces the traversing left  S1 nerve root in the subarticular zone. No neural foraminal narrowing.   IMPRESSION: 1. Mild lumbar spondylosis without spinal canal stenosis. 2. Central disc protrusion at L5-S1 displaces the traversing left S1 nerve root in the subarticular zone.     Electronically Signed   By: Audra Blend M.D.   On: 08/29/2022 16:24   She reports that she quit smoking about 6 years ago. Her smoking use included cigarettes. She started smoking about 46 years ago. She has a 20 pack-year smoking history. She has never used smokeless tobacco.  Recent Labs    02/21/23 1448 07/13/23 0631  HGBA1C 5.6 5.6    Objective:  VS:  HT:    WT:   BMI:     BP:   HR: bpm  TEMP: ( )  RESP:  Physical Exam Vitals and nursing note reviewed.  HENT:     Head: Normocephalic and atraumatic.     Right Ear: External ear normal.     Left Ear: External ear normal.     Nose: Nose normal.     Mouth/Throat:     Mouth: Mucous membranes are moist.  Eyes:     Extraocular Movements: Extraocular movements intact.  Cardiovascular:     Rate and Rhythm: Normal rate.     Pulses: Normal pulses.  Pulmonary:     Effort: Pulmonary effort is normal.  Abdominal:     General: Abdomen is flat. There is no distension.  Musculoskeletal:        General: Tenderness present.     Cervical back: Normal range of motion.     Comments: Patient rises from seated position to standing without difficulty. Good lumbar range of motion. No pain noted with facet loading. 5/5 strength noted with bilateral hip flexion, knee flexion/extension, ankle dorsiflexion/plantarflexion and EHL. No clonus noted bilaterally. No pain upon palpation of greater trochanters. No pain with internal/external rotation of bilateral hips. Sensation intact bilaterally. Myofascial tenderness to bilateral lumbar paraspinal regions upon palpation. Negative slump test bilaterally. Ambulates with rolling walker, gait slow and unsteady.   Skin:    General: Skin is warm and  dry.     Capillary Refill: Capillary refill takes less than 2 seconds.  Neurological:     Mental Status: She is alert and oriented to person, place, and time.     Gait: Gait abnormal.  Psychiatric:        Mood and Affect: Mood normal.        Behavior: Behavior normal.     Ortho Exam  Imaging: No results found.  Past Medical/Family/Surgical/Social History: Medications & Allergies reviewed per EMR, new medications updated. 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   Aortoiliac occlusive disease (HCC) 12/03/2016   Centrilobular emphysema (HCC) 10/03/2016   Right groin pain 02/27/2016   Sacroiliitis (HCC) 02/27/2016  Postmenopausal bleeding 12/01/2015   Psoas tendinitis of right side 10/21/2015   Endometrial thickening on ultrasound 07/28/2015   S/P right THA, 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   Type 2 diabetes mellitus (HCC) 05/18/2014   DDD (degenerative disc disease) 03/18/2013   Metabolic syndrome 07/29/2012   Essential hypertension, benign 07/18/2012   Hyperlipidemia 07/18/2012   Obstructive sleep apnea 07/04/2012   Osteoporosis 07/04/2012   Morbid obesity (HCC) 07/04/2012   Past Medical History:  Diagnosis Date   Aortic stenosis    mild-moderate AS 04/22/19 echo   Aortoiliac occlusive disease (HCC) 12/03/2016   Arthritis    Diabetes mellitus without complication (HCC)    pt states she has been told she is no longer diabetic and is not taking meds   Diverticulitis    Gout    Heart murmur    Hyperlipidemia    Hypertension    Osteoporosis    Sleep  apnea    Tobacco use disorder 07/04/2012   Family History  Problem Relation Age of Onset   Migraines Mother    Thyroid disease Mother        hypothyroid   Cancer Father 32       Dec age 68 with pancreatic Ca   Past Surgical History:  Procedure Laterality Date   ABDOMINAL AORTOGRAM W/LOWER EXTREMITY N/A 12/03/2016   Procedure: Abdominal Aortogram w/Lower Extremity;  Surgeon: Dannis Dy, MD;  Location: [facility] INVASIVE CV LAB;  Service: Cardiovascular;  Laterality: N/A;   AORTA -INNOMIATE BYPASS N/A 05/18/2019   Procedure: AORTA -INNOMIATE BYPASS Using Hemashield Gold Graft Size 8mm;  Surgeon: Bartley Lightning, MD;  Location: [facility] OR;  Service: Open Heart Surgery;  Laterality: N/A;   aorto- fem bypass Bilateral    aortobiiliac bypass in 2000   DILATATION & CURETTAGE/HYSTEROSCOPY WITH MYOSURE N/A 12/27/2015   Procedure: DILATATION & CURETTAGE/HYSTEROSCOPY;  Surgeon: Greta Leatherwood, MD;  Location: [facility];  Service: Gynecology;  Laterality: N/A;   DILATION AND CURETTAGE OF UTERUS  11/2015   ELBOW SURGERY Left ~2007   tendon repair by Dr. Brunilda Capra   EYE SURGERY Bilateral 2008   lasik   JOINT REPLACEMENT     lower aortic bypass  2000   per patient   PERIPHERAL VASCULAR INTERVENTION  12/03/2016   Stenting of anastomotic stenosis of the aortoiliac and right common iliac artery with 7 x 39 mm VBX covered stent and postdilated with 9 mm balloon.   Right innonimate bypass  05/18/2019   Aorto innominate bypass surgery with median sternotomy   STERNOTOMY  05/18/2019   Right aorto-innonimate bypass surgery 05/18/19 Dr. Sherene Dilling   TOTAL HIP ARTHROPLASTY Right 07/27/2014   Procedure: RIGHT TOTAL HIP ARTHROPLASTY ANTERIOR APPROACH;  Surgeon: Claiborne Crew, MD;  Location: [facility];  Service: Orthopedics;  Laterality: Right;   TOTAL KNEE ARTHROPLASTY Right 05/28/2022   Procedure: RIGHT TOTAL KNEE ARTHROPLASTY;  Surgeon: Wes Hamman, MD;  Location: [facility] OR;  Service: Orthopedics;  Laterality:  Right;   TOTAL KNEE ARTHROPLASTY Left 03/04/2023   Procedure: TOTAL KNEE ARTHROPLASTY;  Surgeon: Wes Hamman, MD;  Location: [facility] OR;  Service: Orthopedics;  Laterality: Left;   Social History   Occupational History   Occupation: Tree surgeon  Tobacco Use   Smoking status: Former    Current packs/day: 0.00    Average packs/day: 0.5 packs/day for 40.0 years (20.0 ttl pk-yrs)    Types: Cigarettes    Start date: 02/23/1977    Quit date: 02/23/2017    Years since  quitting: 6.5   Smokeless tobacco: Never  Vaping Use   Vaping status: Never Used  Substance and Sexual Activity   Alcohol use: Yes    Comment: very rare--maybe once a year   Drug use: No   Sexual activity: Yes    Partners: Male    Birth control/protection: Post-menopausal

## 2023-10-01 ENCOUNTER — Other Ambulatory Visit (HOSPITAL_COMMUNITY): Payer: Self-pay

## 2023-10-14 ENCOUNTER — Ambulatory Visit (INDEPENDENT_AMBULATORY_CARE_PROVIDER_SITE_OTHER): Admitting: Physical Medicine and Rehabilitation

## 2023-10-14 ENCOUNTER — Encounter: Payer: Self-pay | Admitting: Physical Medicine and Rehabilitation

## 2023-10-14 ENCOUNTER — Other Ambulatory Visit: Payer: Self-pay

## 2023-10-14 VITALS — BP 132/80 | HR 81

## 2023-10-14 DIAGNOSIS — M5416 Radiculopathy, lumbar region: Secondary | ICD-10-CM | POA: Diagnosis not present

## 2023-10-14 MED ORDER — METHYLPREDNISOLONE ACETATE 40 MG/ML IJ SUSP
40.0000 mg | Freq: Once | INTRAMUSCULAR | Status: AC
  Administered 2023-10-14: 40 mg

## 2023-10-14 NOTE — Progress Notes (Signed)
 Pain usually in the middle of the back. Pain does not go down the legs, however, both legs give out on her. She has not fallen due to legs giving out. Pain is worse if on feet any amount of time. Likes to walk 2-3 miles a day, sometimes back bothers her and sometimes it doesn't. 3/10 pain today but normally 6-7/10 pain.

## 2023-10-14 NOTE — Procedures (Signed)
 Lumbar Epidural Steroid Injection - Interlaminar Approach with Fluoroscopic Guidance  Patient: Diane Giles      Date of Birth: 03-15-1956 MRN: [id number] PCP: Augustus Blood, DO      Visit Date: 10/14/2023   Universal Protocol:     Consent Given By: the patient  Position: PRONE  Additional Comments: Vital signs were monitored before and after the procedure. Patient was prepped and draped in the usual sterile fashion. The correct patient, procedure, and site was verified.   Injection Procedure Details:   Procedure diagnoses: Lumbar radiculopathy [M54.16]   Meds Administered:  Meds ordered this encounter  Medications   methylPREDNISolone  acetate (DEPO-MEDROL ) injection 40 mg     Laterality: Left  Location/Site:  L5-S1  Needle: 3.5 in., 20 ga. Tuohy  Needle Placement: Paramedian epidural  Findings:   -Comments: Excellent flow of contrast into the epidural space.  Procedure Details: Using a paramedian approach from the side mentioned above, the region overlying the inferior lamina was localized under fluoroscopic visualization and the soft tissues overlying this structure were infiltrated with 4 ml. of 1% Lidocaine  without Epinephrine . The Tuohy needle was inserted into the epidural space using a paramedian approach.   The epidural space was localized using loss of resistance along with counter oblique bi-planar fluoroscopic views.  After negative aspirate for air, blood, and CSF, a 2 ml. volume of Isovue -250 was injected into the epidural space and the flow of contrast was observed. Radiographs were obtained for documentation purposes.    The injectate was administered into the level noted above.   Additional Comments:  The patient tolerated the procedure well Dressing: 2 x 2 sterile gauze and Band-Aid    Post-procedure details: Patient was observed during the procedure. Post-procedure instructions were reviewed.  Patient left the clinic in stable  condition.

## 2023-10-14 NOTE — Patient Instructions (Signed)

## 2023-10-14 NOTE — Progress Notes (Signed)
 Diane Giles - 68 y.o. female MRN [id number]  Date of birth: 09-12-1955  Office Visit Note: Visit Date: 10/14/2023 PCP: Augustus Blood, DO Referred by: Augustus Blood, *  Subjective: Chief Complaint  Patient presents with   Lower Back - Pain    Pain usually in the middle of the back. Pain does not go down the legs, however, both legs give out on her.  She has not fallen due to legs giving out. Pain is worse if on feet any amount of time.  Likes to walk 2-3 miles a day, sometimes back bothers her and sometimes it doesn't. 3/10 pain today but normally 6-7/10 pain.   HPI:  Diane Giles is a 68 y.o. female who comes in today at the request of Elvan Hamel, FNP for planned Left L5-S1 Lumbar Interlaminar epidural steroid injection with fluoroscopic guidance.  The patient has failed conservative care including home exercise, medications, time and activity modification.  This injection will be diagnostic and hopefully therapeutic.  Please see requesting physician notes for further details and justification.   ROS Otherwise per HPI.  Assessment & Plan: Visit Diagnoses:    ICD-10-CM   1. Lumbar radiculopathy  M54.16 XR C-ARM NO REPORT    Epidural Steroid injection    methylPREDNISolone  acetate (DEPO-MEDROL ) injection 40 mg      Plan: No additional findings.   Meds & Orders:  Meds ordered this encounter  Medications   methylPREDNISolone  acetate (DEPO-MEDROL ) injection 40 mg    Orders Placed This Encounter  Procedures   XR C-ARM NO REPORT   Epidural Steroid injection    Follow-up: Return for visit to requesting provider as needed.   Procedures: No procedures performed  Lumbar Epidural Steroid Injection - Interlaminar Approach with Fluoroscopic Guidance  Patient: Diane Giles      Date of Birth: [birth year] MRN: [id number] PCP: Augustus Blood, DO      Visit Date: 10/14/2023   Universal Protocol:     Consent Given By: the patient  Position:  PRONE  Additional Comments: Vital signs were monitored before and after the procedure. Patient was prepped and draped in the usual sterile fashion. The correct patient, procedure, and site was verified.   Injection Procedure Details:   Procedure diagnoses: Lumbar radiculopathy [M54.16]   Meds Administered:  Meds ordered this encounter  Medications   methylPREDNISolone  acetate (DEPO-MEDROL ) injection 40 mg     Laterality: Left  Location/Site:  L5-S1  Needle: 3.5 in., 20 ga. Tuohy  Needle Placement: Paramedian epidural  Findings:   -Comments: Excellent flow of contrast into the epidural space.  Procedure Details: Using a paramedian approach from the side mentioned above, the region overlying the inferior lamina was localized under fluoroscopic visualization and the soft tissues overlying this structure were infiltrated with 4 ml. of 1% Lidocaine  without Epinephrine . The Tuohy needle was inserted into the epidural space using a paramedian approach.   The epidural space was localized using loss of resistance along with counter oblique bi-planar fluoroscopic views.  After negative aspirate for air, blood, and CSF, a 2 ml. volume of Isovue -250 was injected into the epidural space and the flow of contrast was observed. Radiographs were obtained for documentation purposes.    The injectate was administered into the level noted above.   Additional Comments:  The patient tolerated the procedure well Dressing: 2 x 2 sterile gauze and Band-Aid    Post-procedure details: Patient was observed during the procedure. Post-procedure instructions were reviewed.  Patient  left the clinic in stable condition.   Clinical History: MRI LUMBAR SPINE WITHOUT CONTRAST   TECHNIQUE: Multiplanar, multisequence MR imaging of the lumbar spine was performed. No intravenous contrast was administered.   COMPARISON:  Lumbar spine radiographs 07/04/2012.   FINDINGS: Segmentation: Conventional  numbering is assumed with 5 non-rib-bearing, lumbar type vertebral bodies.   Alignment:  Normal.   Vertebrae:  Normal vertebral body heights and marrow signal.   Conus medullaris and cauda equina: Conus extends to the T12-L1 level. Conus and cauda equina appear normal.   Paraspinal and other soft tissues: Unremarkable.   Disc levels:   T12-L1:  Normal.   L1-L2:  Normal.   L2-L3:  Normal.   L3-L4:  Normal.   L4-L5: Disc bulge with central annular fissure. No spinal canal stenosis or neural foraminal narrowing.   L5-S1: Central disc protrusion displaces the traversing left S1 nerve root in the subarticular zone. No neural foraminal narrowing.   IMPRESSION: 1. Mild lumbar spondylosis without spinal canal stenosis. 2. Central disc protrusion at L5-S1 displaces the traversing left S1 nerve root in the subarticular zone.     Electronically Signed   By: Audra Blend M.D.   On: 08/29/2022 16:24     Objective:  VS:  HT:    WT:   BMI:     BP:132/80  HR:81bpm  TEMP: ( )  RESP:  Physical Exam Vitals and nursing note reviewed.  Constitutional:      General: She is not in acute distress.    Appearance: Normal appearance. She is not ill-appearing.  HENT:     Head: Normocephalic and atraumatic.     Right Ear: External ear normal.     Left Ear: External ear normal.  Eyes:     Extraocular Movements: Extraocular movements intact.  Cardiovascular:     Rate and Rhythm: Normal rate.     Pulses: Normal pulses.  Pulmonary:     Effort: Pulmonary effort is normal. No respiratory distress.  Abdominal:     General: There is no distension.     Palpations: Abdomen is soft.  Musculoskeletal:        General: Tenderness present.     Cervical back: Neck supple.     Right lower leg: No edema.     Left lower leg: No edema.     Comments: Patient has good distal strength with no pain over the greater trochanters.  No clonus or focal weakness.  Skin:    Findings: No erythema,  lesion or rash.  Neurological:     General: No focal deficit present.     Mental Status: She is alert and oriented to person, place, and time.     Sensory: No sensory deficit.     Motor: No weakness or abnormal muscle tone.     Coordination: Coordination normal.  Psychiatric:        Mood and Affect: Mood normal.        Behavior: Behavior normal.      Imaging: XR C-ARM NO REPORT Result Date: 10/14/2023 Please see Notes tab for imaging impression.

## 2023-10-22 ENCOUNTER — Encounter: Payer: Self-pay | Admitting: Family Medicine

## 2023-10-22 ENCOUNTER — Ambulatory Visit (INDEPENDENT_AMBULATORY_CARE_PROVIDER_SITE_OTHER): Admitting: Family Medicine

## 2023-10-22 ENCOUNTER — Other Ambulatory Visit: Payer: Self-pay | Admitting: Acute Care

## 2023-10-22 VITALS — BP 134/78 | HR 67 | Temp 97.9°F | Ht 58.5 in | Wt 143.0 lb

## 2023-10-22 DIAGNOSIS — F419 Anxiety disorder, unspecified: Secondary | ICD-10-CM | POA: Insufficient documentation

## 2023-10-22 DIAGNOSIS — I10 Essential (primary) hypertension: Secondary | ICD-10-CM

## 2023-10-22 DIAGNOSIS — Z7689 Persons encountering health services in other specified circumstances: Secondary | ICD-10-CM

## 2023-10-22 DIAGNOSIS — M109 Gout, unspecified: Secondary | ICD-10-CM | POA: Diagnosis not present

## 2023-10-22 DIAGNOSIS — Z87891 Personal history of nicotine dependence: Secondary | ICD-10-CM

## 2023-10-22 DIAGNOSIS — E782 Mixed hyperlipidemia: Secondary | ICD-10-CM | POA: Diagnosis not present

## 2023-10-22 DIAGNOSIS — D649 Anemia, unspecified: Secondary | ICD-10-CM

## 2023-10-22 DIAGNOSIS — G629 Polyneuropathy, unspecified: Secondary | ICD-10-CM | POA: Insufficient documentation

## 2023-10-22 DIAGNOSIS — Z122 Encounter for screening for malignant neoplasm of respiratory organs: Secondary | ICD-10-CM

## 2023-10-22 DIAGNOSIS — E1165 Type 2 diabetes mellitus with hyperglycemia: Secondary | ICD-10-CM

## 2023-10-22 DIAGNOSIS — Z7985 Long-term (current) use of injectable non-insulin antidiabetic drugs: Secondary | ICD-10-CM

## 2023-10-22 LAB — MICROALBUMIN / CREATININE URINE RATIO
Creatinine,U: 22.1 mg/dL
Microalb Creat Ratio: 33.3 mg/g — ABNORMAL HIGH (ref 0.0–30.0)
Microalb, Ur: 0.7 mg/dL (ref 0.0–1.9)

## 2023-10-22 LAB — CBC WITH DIFFERENTIAL/PLATELET
Basophils Absolute: 0.1 10*3/uL (ref 0.0–0.1)
Basophils Relative: 0.8 % (ref 0.0–3.0)
Eosinophils Absolute: 0.2 10*3/uL (ref 0.0–0.7)
Eosinophils Relative: 1.9 % (ref 0.0–5.0)
HCT: 39 % (ref 36.0–46.0)
Hemoglobin: 13 g/dL (ref 12.0–15.0)
Lymphocytes Relative: 14.8 % (ref 12.0–46.0)
Lymphs Abs: 1.3 10*3/uL (ref 0.7–4.0)
MCHC: 33.4 g/dL (ref 30.0–36.0)
MCV: 89.1 fl (ref 78.0–100.0)
Monocytes Absolute: 0.6 10*3/uL (ref 0.1–1.0)
Monocytes Relative: 7.1 % (ref 3.0–12.0)
Neutro Abs: 6.4 10*3/uL (ref 1.4–7.7)
Neutrophils Relative %: 75.4 % (ref 43.0–77.0)
Platelets: 245 10*3/uL (ref 150.0–400.0)
RBC: 4.38 Mil/uL (ref 3.87–5.11)
RDW: 14.1 % (ref 11.5–15.5)
WBC: 8.5 10*3/uL (ref 4.0–10.5)

## 2023-10-22 LAB — COMPREHENSIVE METABOLIC PANEL WITH GFR
ALT: 14 U/L (ref 0–35)
AST: 15 U/L (ref 0–37)
Albumin: 4.2 g/dL (ref 3.5–5.2)
Alkaline Phosphatase: 140 U/L — ABNORMAL HIGH (ref 39–117)
BUN: 18 mg/dL (ref 6–23)
CO2: 30 meq/L (ref 19–32)
Calcium: 9.6 mg/dL (ref 8.4–10.5)
Chloride: 103 meq/L (ref 96–112)
Creatinine, Ser: 1.04 mg/dL (ref 0.40–1.20)
GFR: 54.53 mL/min — ABNORMAL LOW (ref >60.00)
Glucose, Bld: 88 mg/dL (ref 70–99)
Potassium: 4.2 meq/L (ref 3.5–5.1)
Sodium: 141 meq/L (ref 135–145)
Total Bilirubin: 0.5 mg/dL (ref 0.2–1.2)
Total Protein: 6.6 g/dL (ref 6.0–8.3)

## 2023-10-22 LAB — LIPID PANEL
Cholesterol: 129 mg/dL (ref 0–200)
HDL: 50.9 mg/dL (ref >39.00)
LDL Cholesterol: 56 mg/dL (ref 0–99)
NonHDL: 78.21
Total CHOL/HDL Ratio: 3
Triglycerides: 113 mg/dL (ref 0.0–149.0)
VLDL: 22.6 mg/dL (ref 0.0–40.0)

## 2023-10-22 LAB — HEMOGLOBIN A1C: Hgb A1c MFr Bld: 5.8 % (ref 4.6–6.5)

## 2023-10-22 NOTE — Assessment & Plan Note (Signed)
 Stable. Continue Ozempic 0.5mg  injection weekly. Discussed about increasing dose to 1mg , but patient reports she needs to get some forms to be filled from the company. Ordered A1c, microalbumin/creatinine urine ratio, and CMP. Needs to have foot exam at next appointment. Patient is on a statin and takes a ARB. Will request records from ophthalmology.

## 2023-10-22 NOTE — Assessment & Plan Note (Signed)
 Stable. Continue taking Pregabalin  75mg  BID. UDS ordered and will need to be completed periodically. Signed controlled substance contract that will need to be renewed yearly. PDMP reviewed. Last refilled 10/02/2023.

## 2023-10-22 NOTE — Patient Instructions (Addendum)
-  It was nice to meet you and look forward to taking care of you. -Ordered labs and urine. Office will call with lab results and will be available on MyChart.  -Continue all medications, except Wellbutrin . Discussed discontinuing this medication. Recommend to take medication every other day for 1 week, second week take every 3rd day. On third week, stop all together. Follow up if needed. -Urine drug screen ordered for management of Pregabalin  75mg  twice daily. This will need to be collected periodically. Signed controlled substance contract. This will need to be renewed yearly.  -Follow up in 3 months and needs an annual wellness visit.

## 2023-10-22 NOTE — Assessment & Plan Note (Signed)
 Stable. Continue taking Allopurinol  100mg  daily.

## 2023-10-22 NOTE — Progress Notes (Signed)
 New Patient Office Visit  Subjective   Patient ID: Diane Giles, female    DOB: May 24, 1955  Age: 68 y.o. MRN: [id number]  CC:  Chief Complaint  Patient presents with   Establish Care    HPI Diane Giles presents to establish care with new provider.  Patients previous primary care provider: [facility] with Dr. Rachael Budd.   Specialist: [facility] with Dr. Wilhelmenia Harada  [facility] at [facility] with Charles Connor, NP (She may not follow back up with them) [facility] with Dr. Brayton Calin  Gout: Chronic. Patient is prescribed Allopurinol  100mg  daily. Not had a gout attack in years.   HTN: Chronic. Patient is prescribed Atenolol  25mg  daily and Losartan  50mg  daily. Does not monitor her blood pressure at home. Denies CP, SHOB, HA, dizziness, lightheadedness, or lower extremity edema.  BP Readings from Last 3 Encounters:  10/22/23 134/78  10/14/23 132/80  07/15/23 (!) 135/51    Hyperlipidemia: Chronic. Patient is prescribed Atorvastatin  80mg  daily. Denies any muscle or joint pain.  Lab Results  Component Value Date   CHOL 185 02/22/2022   HDL 46 02/22/2022   LDLCALC 107 (H) 02/22/2022   TRIG 183 (H) 02/22/2022   CHOLHDL 3.7 12/28/2020    Anxiety: Patient is prescribed Bupropion  150mg  daily. Effective. Patient would like to come off medication to see how she does. She does think it was also helping with weight loss.   Vaginal itching: Patient is prescribed Clobetasol . She reports she applies it every three months.   Diabetes: Chronic. Patient is prescribed Ozempic 0.5mg  injection weekly. She reports she previously been on Metformin , but with weight loss she was able to stop medication. She has come off Ozempic before, gained about 13Ibs, and went back on medication. She is interested in increasing medication, but receives the medication directly from company.   Neuropathy: Chronic. Patient is taking Pregabalin  75mg  BID.  Effective.   Lab Results  Component Value Date   HGBA1C 5.6 07/13/2023    Outpatient Encounter Medications as of 10/22/2023  Medication Sig   allopurinol  (ZYLOPRIM ) 100 MG tablet Take 1 tablet (100 mg total) by mouth daily for uric acid/gout   aspirin  EC 81 MG tablet Take 1 tablet (81 mg total) by mouth 2 (two) times daily. To be taken after surgery   atenolol  (TENORMIN ) 25 MG tablet Take 1 tablet (25 mg total) by mouth daily.   atorvastatin  (LIPITOR ) 80 MG tablet Take 1 tablet (80 mg total) by mouth daily at 6 PM.   buPROPion  (WELLBUTRIN  XL) 150 MG 24 hr tablet Take 1 tablet (150 mg total) by mouth in the morning for depression/anxiety   CALCIUM  600 1500 (600 Ca) MG TABS tablet Take 1 tablet by mouth daily.   cholecalciferol  (VITAMIN D3) 25 MCG (1000 UNIT) tablet Take 1 tablet (25 mcg total) by mouth every morning.   losartan  (COZAAR ) 50 MG tablet Take 1 tablet (50 mg total) by mouth daily for blood pressure. If running above 130/80, increase to 2 tablets daily as directed   magnesium  oxide (MAG-OX) 400 (240 Mg) MG tablet Take 400 mg by mouth daily.   OZEMPIC, 0.25 OR 0.5 MG/DOSE, 2 MG/3ML SOPN Inject 0.5 mg into the skin once a week. Pt takes on Mondays   pregabalin  (LYRICA ) 75 MG capsule Take 1 capsule (75 mg total) by mouth 2 (two) times daily.   clobetasol  (TEMOVATE ) 0.05 % GEL Apply to vaginal area once daily (Patient not  taking: Reported on 07/13/2023)   [DISCONTINUED] Calcium  Citrate-Vitamin D  (CALCIUM  CITRATE +) 315-5 MG-MCG TABS Take 1 tablet by mouth daily. (Patient not taking: Reported on 07/13/2023)   [DISCONTINUED] diazepam  (VALIUM ) 5 MG tablet Take one tablet by mouth with light food one hour prior to procedure.   [DISCONTINUED] ezetimibe  (ZETIA ) 10 MG tablet Take 1 tablet (10 mg total) by mouth daily. (Patient not taking: Reported on 10/22/2023)   [DISCONTINUED] Lancets MISC Use as directed once a day. (DX: R73.9)   [DISCONTINUED] magnesium  oxide (MAG-OX) 400 MG tablet Take 1 tablet  (400 mg total) by mouth every evening.   [DISCONTINUED] senna-docusate (SENOKOT-S) 8.6-50 MG tablet Take 1 tablet by mouth daily.   No facility-administered encounter medications on file as of 10/22/2023.    Past Medical History:  Diagnosis Date   Aortic stenosis    mild-moderate AS 04/22/19 echo   Aortoiliac occlusive disease (HCC) 12/03/2016   Arthritis    Bowel obstruction (HCC)    Diabetes mellitus without complication (HCC)    pt states she has been told she is no longer diabetic and is not taking meds   Diverticulitis    Gout    Heart murmur    Hyperlipidemia    Hypertension    Osteoporosis    Sleep apnea    Tobacco use disorder 07/04/2012    Past Surgical History:  Procedure Laterality Date   ABDOMINAL AORTOGRAM W/LOWER EXTREMITY N/A 12/03/2016   Procedure: Abdominal Aortogram w/Lower Extremity;  Surgeon: Dannis Dy, MD;  Location: [facility] INVASIVE CV LAB;  Service: Cardiovascular;  Laterality: N/A;   AORTA -INNOMIATE BYPASS N/A 05/18/2019   Procedure: AORTA -INNOMIATE BYPASS Using Hemashield Gold Graft Size 8mm;  Surgeon: Bartley Lightning, MD;  Location: [facility] OR;  Service: Open Heart Surgery;  Laterality: N/A;   aorto- fem bypass Bilateral    aortobiiliac bypass in 2000   DILATATION & CURETTAGE/HYSTEROSCOPY WITH MYOSURE N/A 12/27/2015   Procedure: DILATATION & CURETTAGE/HYSTEROSCOPY;  Surgeon: Greta Leatherwood, MD;  Location: [facility] ORS;  Service: Gynecology;  Laterality: N/A;   DILATION AND CURETTAGE OF UTERUS  11/2015   ELBOW SURGERY Left ~2007   tendon repair by Dr. Brunilda Capra   EYE SURGERY Bilateral 2008   lasik   JOINT REPLACEMENT     lower aortic bypass  2000   per patient   PERIPHERAL VASCULAR INTERVENTION  12/03/2016   Stenting of anastomotic stenosis of the aortoiliac and right common iliac artery with 7 x 39 mm VBX covered stent and postdilated with 9 mm balloon.   Right innonimate bypass  05/18/2019   Aorto innominate bypass surgery with median  sternotomy   STERNOTOMY  05/18/2019   Right aorto-innonimate bypass surgery 05/18/19 Dr. Sherene Dilling   TOTAL HIP ARTHROPLASTY Right 07/27/2014   Procedure: RIGHT TOTAL HIP ARTHROPLASTY ANTERIOR APPROACH;  Surgeon: Claiborne Crew, MD;  Location: [facility] ORS;  Service: Orthopedics;  Laterality: Right;   TOTAL KNEE ARTHROPLASTY Right 05/28/2022   Procedure: RIGHT TOTAL KNEE ARTHROPLASTY;  Surgeon: Wes Hamman, MD;  Location: [facility] OR;  Service: Orthopedics;  Laterality: Right;   TOTAL KNEE ARTHROPLASTY Left 03/04/2023   Procedure: TOTAL KNEE ARTHROPLASTY;  Surgeon: Wes Hamman, MD;  Location: [facility] OR;  Service: Orthopedics;  Laterality: Left;    Family History  Problem Relation Age of Onset   Migraines Mother    Thyroid disease Mother        hypothyroid   Cancer Father 28       Dec  age 37 with pancreatic Ca   Rheum arthritis Daughter    Migraines Daughter     Social History   Socioeconomic History   Marital status: Married    Spouse name: Not on file   Number of children: 5   Years of education: Not on file   Highest education level: Some college, no degree  Occupational History   Occupation: Tree surgeon  Tobacco Use   Smoking status: Former    Current packs/day: 0.00    Average packs/day: 0.5 packs/day for 40.0 years (20.0 ttl pk-yrs)    Types: Cigarettes    Start date: 02/23/1977    Quit date: 02/23/2017    Years since quitting: 6.6   Smokeless tobacco: Never  Vaping Use   Vaping status: Never Used  Substance and Sexual Activity   Alcohol use: Yes    Comment: very rare--maybe once a year   Drug use: No   Sexual activity: Yes    Partners: Male    Birth control/protection: Post-menopausal  Other Topics Concern   Not on file  Social History Narrative   Married. Patient does not exercise. She has a Geographical information systems officer.   Right handed   Lives with husband in two story home   Social Drivers of Health   Financial Resource Strain: Low Risk  (10/21/2023)   Overall Financial Resource  Strain (CARDIA)    Difficulty of Paying Living Expenses: Not very hard  Food Insecurity: No Food Insecurity (10/21/2023)   Hunger Vital Sign    Worried About Running Out of Food in the Last Year: Never true    Ran Out of Food in the Last Year: Never true  Transportation Needs: No Transportation Needs (10/21/2023)   PRAPARE - Administrator, Civil Service (Medical): No    Lack of Transportation (Non-Medical): No  Physical Activity: Sufficiently Active (10/21/2023)   Exercise Vital Sign    Days of Exercise per Week: 5 days    Minutes of Exercise per Session: 60 min  Stress: No Stress Concern Present (10/21/2023)   [employer] of Occupational Health - Occupational Stress Questionnaire    Feeling of Stress: Not at all  Social Connections: Socially Isolated (10/21/2023)   Social Connection and Isolation Panel    Frequency of Communication with Friends and Family: Once a week    Frequency of Social Gatherings with Friends and Family: Once a week    Attends Religious Services: Never    Database administrator or Organizations: No    Attends Engineer, structural: Not on file    Marital Status: Married  Catering manager Violence: Not At Risk (07/13/2023)   Humiliation, Afraid, Rape, and Kick questionnaire    Fear of Current or Ex-Partner: No    Emotionally Abused: No    Physically Abused: No    Sexually Abused: No    ROS See HPI above    Objective  BP 134/78   Pulse 67   Temp 97.9 F (36.6 C) (Oral)   Ht 4' 10.5 (1.486 m)   Wt 143 lb (64.9 kg)   LMP 05/07/2005 (Approximate)   SpO2 99%   BMI 29.38 kg/m   Physical Exam Vitals reviewed.  Constitutional:      General: She is not in acute distress.    Appearance: Normal appearance. She is not ill-appearing, toxic-appearing or diaphoretic.  HENT:     Head: Normocephalic and atraumatic.   Eyes:     General:  Right eye: No discharge.        Left eye: No discharge.     Conjunctiva/sclera:  Conjunctivae normal.    Cardiovascular:     Rate and Rhythm: Normal rate and regular rhythm.     Heart sounds: Murmur heard.     No friction rub. No gallop.  Pulmonary:     Effort: Pulmonary effort is normal. No respiratory distress.     Breath sounds: Normal breath sounds.   Musculoskeletal:        General: Normal range of motion.     Right lower leg: No edema.     Left lower leg: No edema.   Skin:    General: Skin is warm and dry.   Neurological:     General: No focal deficit present.     Mental Status: She is alert and oriented to person, place, and time. Mental status is at baseline.   Psychiatric:        Mood and Affect: Mood normal.        Behavior: Behavior normal.        Thought Content: Thought content normal.        Judgment: Judgment normal.      Assessment & Plan:  Essential hypertension, benign Assessment & Plan: Stable. Continue Atenolol  25mg  and Losartan  50mg  daily. Ordered CMP to assess kidney function.   Orders: -     Comprehensive metabolic panel with GFR  Gout, unspecified cause, unspecified chronicity, unspecified site Assessment & Plan: Stable. Continue taking Allopurinol  100mg  daily.    Mixed hyperlipidemia Assessment & Plan: Stable. Continue taking Atorvastatin  80mg  daily. Ordered lipid and CMP.   Orders: -     Comprehensive metabolic panel with GFR -     Lipid panel  Anxiety Assessment & Plan: Stable. Patient would like to come off this medication. Recommend to take medication every other day for 1 week, second week take every 3rd day. On third week, stop all together.  Patient scored 0 on GAD-7 and 2 on PHQ-9.    Type 2 diabetes mellitus with hyperglycemia, without long-term current use of insulin  (HCC) Assessment & Plan: Stable. Continue Ozempic 0.5mg  injection weekly. Discussed about increasing dose to 1mg , but patient reports she needs to get some forms to be filled from the company. Ordered A1c, microalbumin/creatinine urine  ratio, and CMP. Needs to have foot exam at next appointment. Patient is on a statin and takes a ARB. Will request records from ophthalmology.   Orders: -     Microalbumin / creatinine urine ratio -     Comprehensive metabolic panel with GFR -     Hemoglobin A1c  Neuropathy Assessment & Plan: Stable. Continue taking Pregabalin  75mg  BID. UDS ordered and will need to be completed periodically. Signed controlled substance contract that will need to be renewed yearly. PDMP reviewed. Last refilled 10/02/2023.  Orders: -     DRUG MONITOR, PANEL 1, SCREEN, URINE  Anemia, unspecified type -     CBC with Differential/Platelet  Encounter to establish care   1.Review health maintenance:  -Colonoscopy: [facility]  -Covid booster: UTD; last September  -Mammogram: Declines  -AWV: Needs appt  -Ophthalmology: [facility] with Dr. Ambrosio Junker  2.Ordered CBC for low HGB in March 2025.  Return in about 3 months (around 01/22/2024) for chronic management ; AWV: either telehealth with Dr. Burdette Carolin or Berdie Breach, LPN .   Quaniya Damas, NP

## 2023-10-22 NOTE — Assessment & Plan Note (Addendum)
 Stable. Continue Atenolol  25mg  and Losartan  50mg  daily. Ordered CMP to assess kidney function.

## 2023-10-22 NOTE — Assessment & Plan Note (Signed)
 Stable. Continue taking Atorvastatin  80mg  daily. Ordered lipid and CMP.

## 2023-10-22 NOTE — Assessment & Plan Note (Signed)
 Stable. Patient would like to come off this medication. Recommend to take medication every other day for 1 week, second week take every 3rd day. On third week, stop all together.  Patient scored 0 on GAD-7 and 2 on PHQ-9.

## 2023-10-23 ENCOUNTER — Ambulatory Visit: Payer: Self-pay | Admitting: Family Medicine

## 2023-10-23 ENCOUNTER — Ambulatory Visit (INDEPENDENT_AMBULATORY_CARE_PROVIDER_SITE_OTHER)

## 2023-10-23 DIAGNOSIS — R748 Abnormal levels of other serum enzymes: Secondary | ICD-10-CM

## 2023-10-23 LAB — DRUG MONITOR, PANEL 1, SCREEN, URINE
Amphetamines: NEGATIVE ng/mL (ref <500)
Barbiturates: NEGATIVE ng/mL (ref <300)
Benzodiazepines: NEGATIVE ng/mL (ref <100)
Cocaine Metabolite: NEGATIVE ng/mL (ref <150)
Creatinine: 21.9 mg/dL (ref >=20.0)
Marijuana Metabolite: NEGATIVE ng/mL (ref <20)
Methadone Metabolite: NEGATIVE ng/mL (ref <100)
Opiates: NEGATIVE ng/mL (ref <100)
Oxidant: NEGATIVE ug/mL (ref <200)
Oxycodone: NEGATIVE ng/mL (ref <100)
Phencyclidine: NEGATIVE ng/mL (ref <25)
pH: 7.4 (ref 4.5–9.0)

## 2023-10-23 LAB — GAMMA GT: GGT: 9 U/L (ref 7–51)

## 2023-10-23 LAB — DM TEMPLATE

## 2023-10-23 MED ORDER — DAPAGLIFLOZIN PROPANEDIOL 10 MG PO TABS: 10.0000 mg | ORAL_TABLET | Freq: Every day | ORAL | 2 refills | Status: DC

## 2023-10-24 ENCOUNTER — Ambulatory Visit: Payer: Self-pay | Admitting: Family Medicine

## 2023-10-25 ENCOUNTER — Telehealth: Payer: Self-pay | Admitting: Family Medicine

## 2023-10-25 NOTE — Telephone Encounter (Signed)
 Patient dropped off document patient assistance program forms, to be filled out by provider. Patient requested to send it back via Fax within 7-days. Document is located in providers tray at front office.Please advise at Mobile [phone] (mobile) Pt requests a call when completed

## 2023-10-25 NOTE — Telephone Encounter (Signed)
 noted

## 2023-11-01 ENCOUNTER — Ambulatory Visit (INDEPENDENT_AMBULATORY_CARE_PROVIDER_SITE_OTHER): Admitting: Family Medicine

## 2023-11-01 ENCOUNTER — Encounter: Payer: Self-pay | Admitting: Family Medicine

## 2023-11-01 VITALS — BP 130/72 | HR 70 | Temp 97.6°F | Ht 58.5 in | Wt 142.0 lb

## 2023-11-01 DIAGNOSIS — S51819A Laceration without foreign body of unspecified forearm, initial encounter: Secondary | ICD-10-CM

## 2023-11-01 MED ORDER — MUPIROCIN 2 % EX OINT: 1.0000 | TOPICAL_OINTMENT | Freq: Two times a day (BID) | CUTANEOUS | 0 refills | Status: AC

## 2023-11-01 NOTE — Patient Instructions (Signed)
-  It was nice to see you today and I am sorry this has happened to you.  -Recommend to cleanse the wound on left forearm twice a day with a mild soap, such as Dial Antibacterial Soap, Chlorhexidine . Pat dry and apply a thin layer of Mupirocin (Bactroban) ointment over the wound. Cover the wound with a nonstick dressing. Do this until wound is closed.  -Discussed about signs of infection and if symptoms occurred, follow up.  -Decided through shared decision making that an x-ray was not necessary since their is no pain in the arm or no major swelling.  -Follow up if not improved or becomes worse.

## 2023-11-01 NOTE — Progress Notes (Signed)
   Acute Office Visit   Subjective:  Patient ID: Diane Giles, female    DOB: 05-Mar-1956, 68 y.o.   MRN: [id number]  Chief Complaint  Patient presents with   Puncture Wound    HPI Patient reports she dropped some gym equipment on her left forearm on Monday. She reports it took some skin off. She has only been applying Neosporin once a day, maybe twice a day. Not cleaning wound. Also, has been been keeping it covered.  She reports she was concerned about infection and if she was caring for it correctly.  ROS See HPI above      Objective:   BP 130/72   Pulse 70   Temp 97.6 F (36.4 C) (Oral)   Ht 4' 10.5 (1.486 m)   Wt 142 lb (64.4 kg)   LMP 05/07/2005 (Approximate)   SpO2 98%   BMI 29.17 kg/m    Physical Exam Vitals reviewed.  Constitutional:      General: She is not in acute distress.    Appearance: Normal appearance. She is not ill-appearing, toxic-appearing or diaphoretic.  HENT:     Head: Normocephalic and atraumatic.   Eyes:     General:        Right eye: No discharge.        Left eye: No discharge.     Conjunctiva/sclera: Conjunctivae normal.    Cardiovascular:     Rate and Rhythm: Normal rate.  Pulmonary:     Effort: Pulmonary effort is normal. No respiratory distress.   Musculoskeletal:        General: Normal range of motion.   Skin:    General: Skin is warm and dry.     Findings: Wound (See picture of left forearm.) present.   Neurological:     General: No focal deficit present.     Mental Status: She is alert and oriented to person, place, and time. Mental status is at baseline.   Psychiatric:        Mood and Affect: Mood normal.        Behavior: Behavior normal.        Thought Content: Thought content normal.        Judgment: Judgment normal.         Assessment & Plan:  Skin tear of forearm without complication, initial encounter -     Mupirocin; Apply 1 Application topically 2 (two) times daily for 11 days.  Dispense: 22 g;  Refill: 0  -Recommend to cleanse the wound on left forearm twice a day with a mild soap, such as Dial Antibacterial Soap, Chlorhexidine . Pat dry and apply a thin layer of Mupirocin (Bactroban) ointment over the wound. Cover the wound with a nonstick dressing. Do this until wound is closed.  -Discussed about signs of infection and if symptoms occurred, follow up.  -Decided through shared decision making that an x-ray was not necessary since their is no pain in the arm or no major swelling.  -Follow up if not improved or becomes worse.   Franziska Podgurski, NP

## 2023-11-06 ENCOUNTER — Ambulatory Visit (HOSPITAL_COMMUNITY)
Admission: RE | Admit: 2023-11-06 | Discharge: 2023-11-06 | Disposition: A | Discharge status: Home / Self Care | Source: Ambulatory Visit | Attending: Geriatric Medicine | Admitting: Geriatric Medicine

## 2023-11-06 DIAGNOSIS — Z87891 Personal history of nicotine dependence: Secondary | ICD-10-CM | POA: Insufficient documentation

## 2023-11-06 DIAGNOSIS — Z122 Encounter for screening for malignant neoplasm of respiratory organs: Secondary | ICD-10-CM | POA: Insufficient documentation

## 2023-11-11 ENCOUNTER — Telehealth: Payer: Self-pay

## 2023-11-11 NOTE — Progress Notes (Signed)
   11/11/2023  Patient ID: Diane Giles, female   DOB: [birth year], 68 y.o.   MRN: [id number]  Received notice from [employer] that patient assistance application for Ozempic 1mg  needed the PCP signature.  Forms completed and faxed in, pending company approval.  Jon VEAR Lindau, PharmD Clinical Pharmacist [phone]

## 2023-11-18 ENCOUNTER — Ambulatory Visit: Admitting: Physical Medicine and Rehabilitation

## 2023-11-18 ENCOUNTER — Encounter: Payer: Self-pay | Admitting: Physical Medicine and Rehabilitation

## 2023-11-18 ENCOUNTER — Other Ambulatory Visit: Payer: Self-pay

## 2023-11-18 DIAGNOSIS — M5116 Intervertebral disc disorders with radiculopathy, lumbar region: Secondary | ICD-10-CM | POA: Diagnosis not present

## 2023-11-18 DIAGNOSIS — M545 Low back pain, unspecified: Secondary | ICD-10-CM | POA: Diagnosis not present

## 2023-11-18 DIAGNOSIS — R29898 Other symptoms and signs involving the musculoskeletal system: Secondary | ICD-10-CM

## 2023-11-18 DIAGNOSIS — G8929 Other chronic pain: Secondary | ICD-10-CM

## 2023-11-18 DIAGNOSIS — Z87891 Personal history of nicotine dependence: Secondary | ICD-10-CM

## 2023-11-18 DIAGNOSIS — Z122 Encounter for screening for malignant neoplasm of respiratory organs: Secondary | ICD-10-CM

## 2023-11-18 NOTE — Progress Notes (Signed)
 Diane Giles - 68 y.o. female MRN [id number]  Date of birth: 06-22-1955  Office Visit Note: Visit Date: 11/18/2023 PCP: Billy Philippe SAUNDERS, NP Referred by: Judyth Isaiah Bottcher, *  Subjective: Chief Complaint  Patient presents with   Lower Back - Follow-up   HPI: Diane Giles is a 68 y.o. female who comes in today for evaluation of chronic, worsening and severe bilateral lower back pain, also reports weakness down both legs. Symptoms ongoing 20 plus years, no specific aggravating factors. States her legs feel very weak, especially when attempting to go from sitting to standing position and with walking. Standing for several minutes seems to resolve feeling of weakness to bilateral legs. She has tried home exercise regimen, rest and use of medications with minimal relief of pain.  Lumbar MRI imaging from 2024 shows central disc protrusion at L5-S1 displaces the traversing left S1 nerve root in the subarticular zone. There is central annular fissure at L4-L5. No high grade spinal canal stenosis. Patient underwent left L5-S1 interlaminar epidural steroid injection at [facility] on 02/20/2023. She reports significant relief of pain with this injection  More recently, she underwent left L5-S1 interlaminar epidural steroid injection in our office on 10/14/2023, no relief of pain with procedure. Patient denies numbness and tingling. No recent trauma or falls.   She reports history of vascular issues many years ago, she is followed by vascular surgery. History of aortoiliac occlusive disease.      Review of Systems  Musculoskeletal:  Positive for back pain and myalgias.  Neurological:  Positive for weakness. Negative for sensory change.  All other systems reviewed and are negative.  Otherwise per HPI.  Assessment & Plan: Visit Diagnoses:    ICD-10-CM   1. Chronic bilateral low back pain without sciatica  M54.50 Ambulatory referral to Physical Therapy   G89.29     2. Intervertebral  disc disorders with radiculopathy, lumbar region  M51.16 Ambulatory referral to Physical Therapy    3. Weakness of both legs  R29.898 Ambulatory referral to Physical Therapy       Plan: Findings:  Chronic, worsening and severe bilateral lower back pain. Feeling of weakness to bilateral lower extremities. Patient continues to experience symptoms despite good conservative therapies such as home exercise regimen, rest and use of medications. Patients clinical presentation and exam are complex. Lumbar MRI findings from 2024 do not directly correlate with long standing history of bilateral leg weakness. Of note, no focal weakness noted to bilateral lower extremities upon exam today, she has good strength to both legs. We do not feel her reported weakness to both lower extremities is related to her spine. We discussed treatment plan in detail today. Next step is to place order for short course of formal physical therapy. I do think she would benefit from core strengthening and manual treatments. Myofascial tenderness noted to bilateral lumbar paraspinal region upon palpation today. We are happy to see her back as needed. No red flag symptoms noted upon exam today.     Meds & Orders: No orders of the defined types were placed in this encounter.   Orders Placed This Encounter  Procedures   Ambulatory referral to Physical Therapy    Follow-up: Return if symptoms worsen or fail to improve.   Procedures: No procedures performed      Clinical History: MRI LUMBAR SPINE WITHOUT CONTRAST   TECHNIQUE: Multiplanar, multisequence MR imaging of the lumbar spine was performed. No intravenous contrast was administered.   COMPARISON:  Lumbar spine radiographs 07/04/2012.   FINDINGS: Segmentation: Conventional numbering is assumed with 5 non-rib-bearing, lumbar type vertebral bodies.   Alignment:  Normal.   Vertebrae:  Normal vertebral body heights and marrow signal.   Conus medullaris and cauda  equina: Conus extends to the T12-L1 level. Conus and cauda equina appear normal.   Paraspinal and other soft tissues: Unremarkable.   Disc levels:   T12-L1:  Normal.   L1-L2:  Normal.   L2-L3:  Normal.   L3-L4:  Normal.   L4-L5: Disc bulge with central annular fissure. No spinal canal stenosis or neural foraminal narrowing.   L5-S1: Central disc protrusion displaces the traversing left S1 nerve root in the subarticular zone. No neural foraminal narrowing.   IMPRESSION: 1. Mild lumbar spondylosis without spinal canal stenosis. 2. Central disc protrusion at L5-S1 displaces the traversing left S1 nerve root in the subarticular zone.     Electronically Signed   By: Ryan Chess M.D.   On: 08/29/2022 16:24   She reports that she quit smoking about 6 years ago. Her smoking use included cigarettes. She started smoking about 46 years ago. She has a 20 pack-year smoking history. She has never used smokeless tobacco.  Recent Labs    02/21/23 1448 07/13/23 0631 10/22/23 1454  HGBA1C 5.6 5.6 5.8    Objective:  VS:  HT:    WT:   BMI:     BP:   HR: bpm  TEMP: ( )  RESP:  Physical Exam Vitals and nursing note reviewed.  HENT:     Head: Normocephalic and atraumatic.     Right Ear: External ear normal.     Left Ear: External ear normal.     Nose: Nose normal.     Mouth/Throat:     Mouth: Mucous membranes are moist.  Eyes:     Extraocular Movements: Extraocular movements intact.  Cardiovascular:     Rate and Rhythm: Normal rate.     Pulses: Normal pulses.  Pulmonary:     Effort: Pulmonary effort is normal.  Abdominal:     General: Abdomen is flat. There is no distension.  Musculoskeletal:        General: Tenderness present.     Cervical back: Normal range of motion.     Comments: Patient rises from seated position to standing without difficulty. Good lumbar range of motion. No pain noted with facet loading. 5/5 strength noted with bilateral hip flexion, knee  flexion/extension, ankle dorsiflexion/plantarflexion and EHL. No clonus noted bilaterally. No pain upon palpation of greater trochanters. No pain with internal/external rotation of bilateral hips. Sensation intact bilaterally. Myofascial tenderness noted to bilateral lumbar paraspinal region. Negative slump test bilaterally. Ambulates without aid, gait steady.     Skin:    General: Skin is warm and dry.     Capillary Refill: Capillary refill takes less than 2 seconds.  Neurological:     General: No focal deficit present.     Mental Status: She is alert and oriented to person, place, and time.  Psychiatric:        Mood and Affect: Mood normal.        Behavior: Behavior normal.     Ortho Exam  Imaging: No results found.  Past Medical/Family/Surgical/Social History: Medications & Allergies reviewed per EMR, new medications updated. 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   Aortoiliac occlusive disease (HCC) 12/03/2016   Centrilobular emphysema (HCC) 10/03/2016   Right groin pain 02/27/2016   Sacroiliitis (HCC) 02/27/2016   Postmenopausal bleeding 12/01/2015   Psoas tendinitis of right side 10/21/2015   Endometrial thickening on ultrasound 07/28/2015   S/P right THA, 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   Type 2 diabetes mellitus (HCC) 05/18/2014   DDD (degenerative disc disease) 03/18/2013   Metabolic syndrome 07/29/2012   Essential hypertension, benign 07/18/2012   Hyperlipidemia 07/18/2012   Obstructive  sleep apnea 07/04/2012   Osteoporosis 07/04/2012   Morbid obesity (HCC) 07/04/2012   Past Medical History:  Diagnosis Date   Aortic stenosis    mild-moderate AS 04/22/19 echo   Aortoiliac occlusive disease (HCC) 12/03/2016   Arthritis    Bowel obstruction (HCC)    Diabetes mellitus without complication (HCC)    pt states she has been told she is no longer diabetic and is not taking meds   Diverticulitis    Gout    Heart murmur    Hyperlipidemia    Hypertension    Osteoporosis    Sleep apnea    Tobacco use disorder 07/04/2012   Family History  Problem Relation Age of Onset   Migraines Mother    Thyroid disease Mother        hypothyroid   Cancer Father 54       Dec age 65 with pancreatic Ca   Rheum arthritis Daughter    Migraines Daughter    Past Surgical History:  Procedure Laterality Date   ABDOMINAL AORTOGRAM W/LOWER EXTREMITY N/A 12/03/2016   Procedure: Abdominal Aortogram w/Lower Extremity;  Surgeon: Eliza Lonni RAMAN, MD;  Location: [facility] INVASIVE CV LAB;  Service: Cardiovascular;  Laterality: N/A;   AORTA -INNOMIATE BYPASS N/A 05/18/2019   Procedure: AORTA -INNOMIATE BYPASS Using Hemashield Gold Graft Size 8mm;  Surgeon: Lucas Dorise POUR, MD;  Location: [facility] OR;  Service: Open Heart Surgery;  Laterality: N/A;   aorto- fem bypass Bilateral    aortobiiliac bypass in 2000   DILATATION & CURETTAGE/HYSTEROSCOPY WITH MYOSURE N/A 12/27/2015   Procedure: DILATATION & CURETTAGE/HYSTEROSCOPY;  Surgeon: Bobie FORBES Cathlyn JAYSON Nikki, MD;  Location: [facility] ORS;  Service: Gynecology;  Laterality: N/A;   DILATION AND CURETTAGE OF UTERUS  11/2015   ELBOW SURGERY Left ~2007   tendon repair by Dr. Kay   EYE SURGERY Bilateral 2008   lasik   JOINT REPLACEMENT     lower aortic bypass  2000   per patient   PERIPHERAL VASCULAR INTERVENTION  12/03/2016   Stenting of anastomotic stenosis of the aortoiliac and right common iliac artery with 7 x 39 mm VBX covered stent and postdilated with 9  mm balloon.   Right innonimate bypass  05/18/2019   Aorto innominate bypass surgery with median sternotomy   STERNOTOMY  05/18/2019   Right aorto-innonimate bypass surgery 05/18/19 Dr. Lucas   TOTAL HIP ARTHROPLASTY Right 07/27/2014   Procedure: RIGHT TOTAL HIP ARTHROPLASTY ANTERIOR APPROACH;  Surgeon: Donnice Car, MD;  Location: [facility];  Service: Orthopedics;  Laterality: Right;   TOTAL KNEE ARTHROPLASTY Right 05/28/2022   Procedure: RIGHT TOTAL KNEE ARTHROPLASTY;  Surgeon: Jerri Moody  M, MD;  Location: [facility] OR;  Service: Orthopedics;  Laterality: Right;   TOTAL KNEE ARTHROPLASTY Left 03/04/2023   Procedure: TOTAL KNEE ARTHROPLASTY;  Surgeon: Jerri Kay HERO, MD;  Location: [facility] OR;  Service: Orthopedics;  Laterality: Left;   Social History   Occupational History   Occupation: Tree surgeon  Tobacco Use   Smoking status: Former    Current packs/day: 0.00    Average packs/day: 0.5 packs/day for 40.0 years (20.0 ttl pk-yrs)    Types: Cigarettes    Start date: 02/23/1977    Quit date: 02/23/2017    Years since quitting: 6.7   Smokeless tobacco: Never  Vaping Use   Vaping status: Never Used  Substance and Sexual Activity   Alcohol use: Yes    Comment: very rare--maybe once a year   Drug use: No   Sexual activity: Yes    Partners: Male    Birth control/protection: Post-menopausal

## 2023-11-18 NOTE — Progress Notes (Signed)
 Pain Scale   Average Pain 0 Patient advising she still has pain in her lower back even after her injection on 10/14/2023.        +Driver, -BT, -Dye Allergies.

## 2023-11-20 ENCOUNTER — Other Ambulatory Visit: Payer: Self-pay

## 2023-11-20 ENCOUNTER — Telehealth: Payer: Self-pay

## 2023-11-20 MED ORDER — DAPAGLIFLOZIN PROPANEDIOL 10 MG PO TABS: 10.0000 mg | ORAL_TABLET | Freq: Every day | ORAL | 1 refills | Status: DC

## 2023-11-20 NOTE — Progress Notes (Signed)
   11/20/2023  Patient ID: Renna JAYSON Ness, female   DOB: 08-Feb-1956, 68 y.o.   MRN: [id number]  Received request from [employer] patient assistance program that they need a new rx for the patient's Farxiga  10mg  tablets. Routing request to PCP team for refill.  Jon VEAR Lindau, PharmD Clinical Pharmacist [phone]

## 2023-11-28 ENCOUNTER — Other Ambulatory Visit: Payer: Self-pay | Admitting: Family Medicine

## 2023-12-02 ENCOUNTER — Encounter: Payer: Self-pay | Admitting: Family Medicine

## 2023-12-03 ENCOUNTER — Encounter: Payer: Self-pay | Admitting: Family Medicine

## 2023-12-03 ENCOUNTER — Ambulatory Visit: Payer: Self-pay

## 2023-12-03 NOTE — Telephone Encounter (Signed)
 FYI Only or Action Required?: FYI only for provider.  Patient was last seen in primary care on 11/01/2023 by Billy Philippe SAUNDERS, NP.  Called Nurse Triage reporting Dizziness.  Symptoms began x 2 weeks ago.  Interventions attempted: Rest, hydration, or home remedies and Ice/heat application.  Symptoms are: unchanged.  Triage Disposition: See Physician Within 24 Hours  Patient/caregiver understands and will follow disposition?:   **Patient scheduled for 7/30, if symptoms worsen she will seek care in ED today*       Copied from CRM #[id number]. Topic: Clinical - Red Word Triage >> Dec 03, 2023  1:48 PM Berneda FALCON wrote: Red Word that prompted transfer to Nurse Triage: Patient experiencing dizzy spells over the past couple of weeks. Had one this morning, but not currently dizzy right now. Was told through MyChart to schedule an appt. Reason for Disposition  [1] MODERATE dizziness (e.g., interferes with normal activities) AND [2] has NOT been evaluated by doctor (or NP/PA) for this  (Exception: Dizziness caused by heat exposure, sudden standing, or poor fluid intake.)  Answer Assessment - Initial Assessment Questions 1. DESCRIPTION: Describe your dizziness.       She states during these episodes she feels lightheaded.   2. LIGHTHEADED: Do you feel lightheaded? (e.g., somewhat faint, woozy, weak upon standing)     Yes, not currently   3. VERTIGO: Do you feel like either you or the room is spinning or tilting? (i.e., vertigo)     No   4. SEVERITY: How bad is it?  Do you feel like you are going to faint? Can you stand and walk?      During the episodes she rates the severity as moderate/severe.   5. ONSET:  When did the dizziness begin?     Ongoing x 2 weeks   6. AGGRAVATING FACTORS: Does anything make it worse? (e.g., standing, change in head position)     Standing   8. CAUSE: What do you think is causing the dizziness? (e.g., decreased fluids or food,  diarrhea, emotional distress, heat exposure, new medicine, sudden standing, vomiting; unknown)     Unknown   9. RECURRENT SYMPTOM: Have you had dizziness before? If Yes, ask: When was the last time? What happened that time?     No   10. OTHER SYMPTOMS: Do you have any other symptoms? (e.g., fever, chest pain, vomiting, diarrhea, bleeding) No  Protocols used: Dizziness - Lightheadedness-A-AH

## 2023-12-03 NOTE — Telephone Encounter (Signed)
 Called patient left a VM to call back and schedule an appt

## 2023-12-03 NOTE — Telephone Encounter (Signed)
 Patient has an appt 7/30

## 2023-12-04 ENCOUNTER — Encounter: Payer: Self-pay | Admitting: Family Medicine

## 2023-12-04 ENCOUNTER — Ambulatory Visit (HOSPITAL_BASED_OUTPATIENT_CLINIC_OR_DEPARTMENT_OTHER): Admitting: Orthopaedic Surgery

## 2023-12-04 ENCOUNTER — Ambulatory Visit (INDEPENDENT_AMBULATORY_CARE_PROVIDER_SITE_OTHER): Admitting: Family Medicine

## 2023-12-04 VITALS — Temp 98.0°F | Ht 58.5 in | Wt 140.6 lb

## 2023-12-04 DIAGNOSIS — Z862 Personal history of diseases of the blood and blood-forming organs and certain disorders involving the immune mechanism: Secondary | ICD-10-CM | POA: Diagnosis not present

## 2023-12-04 DIAGNOSIS — R42 Dizziness and giddiness: Secondary | ICD-10-CM | POA: Diagnosis not present

## 2023-12-04 DIAGNOSIS — E538 Deficiency of other specified B group vitamins: Secondary | ICD-10-CM | POA: Diagnosis not present

## 2023-12-04 DIAGNOSIS — R5383 Other fatigue: Secondary | ICD-10-CM | POA: Diagnosis not present

## 2023-12-04 DIAGNOSIS — E559 Vitamin D deficiency, unspecified: Secondary | ICD-10-CM | POA: Diagnosis not present

## 2023-12-04 LAB — CBC WITH DIFFERENTIAL/PLATELET
Basophils Absolute: 0 K/uL (ref 0.0–0.1)
Basophils Relative: 0.6 % (ref 0.0–3.0)
Eosinophils Absolute: 0.1 K/uL (ref 0.0–0.7)
Eosinophils Relative: 2.6 % (ref 0.0–5.0)
HCT: 35.6 % — ABNORMAL LOW (ref 36.0–46.0)
Hemoglobin: 12 g/dL (ref 12.0–15.0)
Lymphocytes Relative: 13.2 % (ref 12.0–46.0)
Lymphs Abs: 0.7 K/uL (ref 0.7–4.0)
MCHC: 33.7 g/dL (ref 30.0–36.0)
MCV: 89.4 fl (ref 78.0–100.0)
Monocytes Absolute: 0.4 K/uL (ref 0.1–1.0)
Monocytes Relative: 6.9 % (ref 3.0–12.0)
Neutro Abs: 4 K/uL (ref 1.4–7.7)
Neutrophils Relative %: 76.7 % (ref 43.0–77.0)
Platelets: 227 K/uL (ref 150.0–400.0)
RBC: 3.98 Mil/uL (ref 3.87–5.11)
RDW: 14.3 % (ref 11.5–15.5)
WBC: 5.2 K/uL (ref 4.0–10.5)

## 2023-12-04 LAB — COMPREHENSIVE METABOLIC PANEL WITH GFR
ALT: 12 U/L (ref 0–35)
AST: 18 U/L (ref 0–37)
Albumin: 3.8 g/dL (ref 3.5–5.2)
Alkaline Phosphatase: 143 U/L — ABNORMAL HIGH (ref 39–117)
BUN: 17 mg/dL (ref 6–23)
CO2: 27 meq/L (ref 19–32)
Calcium: 9.4 mg/dL (ref 8.4–10.5)
Chloride: 104 meq/L (ref 96–112)
Creatinine, Ser: 1.21 mg/dL — ABNORMAL HIGH (ref 0.40–1.20)
GFR: 45.43 mL/min — ABNORMAL LOW (ref >60.00)
Glucose, Bld: 84 mg/dL (ref 70–99)
Potassium: 4.4 meq/L (ref 3.5–5.1)
Sodium: 139 meq/L (ref 135–145)
Total Bilirubin: 0.5 mg/dL (ref 0.2–1.2)
Total Protein: 6.3 g/dL (ref 6.0–8.3)

## 2023-12-04 LAB — MAGNESIUM: Magnesium: 2 mg/dL (ref 1.5–2.5)

## 2023-12-04 LAB — VITAMIN D 25 HYDROXY (VIT D DEFICIENCY, FRACTURES): VITD: 47.63 ng/mL (ref 30.00–100.00)

## 2023-12-04 LAB — TSH: TSH: 2.36 u[IU]/mL (ref 0.35–5.50)

## 2023-12-04 LAB — VITAMIN B12: Vitamin B-12: 161 pg/mL — ABNORMAL LOW (ref 211–911)

## 2023-12-04 NOTE — Patient Instructions (Addendum)
-  Ordered labs (CBC, CMP, TSH, Vitamin B12/D, Magnesium , and Iron Panel) as a reason for dizziness, fatigue with a history of deficiency in Vitamin D /B12 and anemia. Office will call with lab results and will be available on MyChart.  -Orthostatics completed today with a positive findings.  -EKG competed with no acute findings.  -Discussed about reasons for dizziness, such as inner ear-vertigo, neurological, cardiac, and dehydration. Leaning toward dehydration being a main reason for dizziness.  -Recommend to monitor blood pressure twice a day and during episodes of dizziness. If blood pressure is low in the AM, wait 1 hour before rechecking blood pressure and taking medication for the day.  -Recommend to increase fluid intake, primarily water 64-100oz a day. If going to work out or being outside more, drink more water.  -If symptoms become severely worse, develop chest pain, shortness of breath, and severe headache-go directly to the closes emergency department.  -Follow up in 2 weeks.

## 2023-12-04 NOTE — Progress Notes (Signed)
 Established Patient Office Visit   Subjective:  Patient ID: Diane Giles, female    DOB: 04-16-1956  Age: 68 y.o. MRN: [id number]  Chief Complaint  Patient presents with   Dizziness    Couple of weeks maybe a little longer; seems like its getting worse, everyday now mainly in the mornings;     HPI Patient is complaining of episodes of fogginess, like she gradually her energy will be depleted, becomes wobble. Not had a fall. She denies it being dizziness, but spouse reports he feels like it dizziness. She denies feeling her self or the room spinning. Started at least 2 weeks ago. Usually occurs daily, mostly in the morning, but can occur during the day. This has occurred 3 times in a row in the morning. Usually occurs 2-3 times a day. This has occurred while resting, after exercising, getting out of pool.   Denies chest pain, shortness of breath, or headaches.   She takes Atenolol  25mg  daily and Losartan  50mg  daily in the morning. She reports her blood pressures have been ranging 113-164/51-66. The highest reading have been in the afternoon or evening. The lowest readings have been in the morning.   She reports she drinks about 64 oz a day, mostly Gatorade is fluid intake. Not been staying outside as much. When she does stay outside, not drinking additional water.   ROS See HPI above     Objective:   Temp 98 F (36.7 C)   Ht 4' 10.5 (1.486 m)   Wt 140 lb 9.6 oz (63.8 kg)   LMP 05/07/2005 (Approximate)   SpO2 93%   BMI 28.89 kg/m    Physical Exam Vitals reviewed.  Constitutional:      General: She is not in acute distress.    Appearance: Normal appearance. She is overweight. She is not ill-appearing, toxic-appearing or diaphoretic.  HENT:     Head: Normocephalic and atraumatic.     Right Ear: There is impacted cerumen.     Left Ear: Tympanic membrane, ear canal and external ear normal. There is no impacted cerumen.  Eyes:     General:        Right eye: No  discharge.        Left eye: No discharge.     Extraocular Movements:     Right eye: No nystagmus.     Left eye: No nystagmus.     Conjunctiva/sclera: Conjunctivae normal.     Pupils: Pupils are equal, round, and reactive to light.  Cardiovascular:     Rate and Rhythm: Normal rate and regular rhythm.     Heart sounds: Murmur heard.     No friction rub. No gallop.  Pulmonary:     Effort: Pulmonary effort is normal. No respiratory distress.     Breath sounds: Normal breath sounds.  Musculoskeletal:        General: Normal range of motion.  Skin:    General: Skin is warm and dry.  Neurological:     General: No focal deficit present.     Mental Status: She is alert and oriented to person, place, and time. Mental status is at baseline.     Cranial Nerves: Cranial nerves 2-12 are intact. No cranial nerve deficit or facial asymmetry.     Motor: Weakness (During episode) present.     Gait: Gait normal.  Psychiatric:        Mood and Affect: Mood normal.        Behavior: Behavior normal.  Thought Content: Thought content normal.        Judgment: Judgment normal.   EKG completed for dizziness. Sinus rhythm, HR 69. No acute changes compared to previous EKG on 02/21/2023 with NSR, HR 75.     Assessment & Plan:  Dizziness -     EKG 12-Lead -     CBC with Differential/Platelet -     Comprehensive metabolic panel with GFR -     TSH -     VITAMIN D  25 Hydroxy (Vit-D Deficiency, Fractures) -     Vitamin B12 -     Magnesium   Fatigue, unspecified type -     CBC with Differential/Platelet -     Comprehensive metabolic panel with GFR -     TSH -     VITAMIN D  25 Hydroxy (Vit-D Deficiency, Fractures) -     Vitamin B12 -     Magnesium   Vitamin D  deficiency -     VITAMIN D  25 Hydroxy (Vit-D Deficiency, Fractures)  Vitamin B12 deficiency -     Vitamin B12  History of anemia -     CBC with Differential/Platelet -     Iron, TIBC and Ferritin Panel  -Ordered labs (CBC, CMP, TSH,  Vitamin B12/D, Magnesium , and Iron Panel) as a reason for dizziness, fatigue with a history of deficiency in Vitamin D /B12 and anemia. Office will call with lab results and will be available on MyChart.  -Orthostatics completed today with a positive findings.  -EKG competed with no acute findings.  -Discussed about reasons for dizziness, such as inner ear-vertigo, neurological, cardiac, and dehydration. Leaning toward dehydration being a main reason for dizziness.  -Recommend to monitor blood pressure twice a day and during episodes of dizziness. If blood pressure is low in the AM, wait 1 hour before rechecking blood pressure and taking medication for the day. Not convinced that blood pressure medication needs to be decreased due to having some elevated BP readings in the afternoon.  -Recommend to increase fluid intake, primarily water 64-100oz a day. If going to work out or being outside more, drink more water.  -If symptoms become severely worse, develop chest pain, shortness of breath, and severe headache-go directly to the closes emergency department.  -Follow up in 2 weeks.   Return in about 2 weeks (around 12/18/2023) for follow-up.   Prestina Raigoza, NP

## 2023-12-05 ENCOUNTER — Ambulatory Visit: Payer: Self-pay | Admitting: Family Medicine

## 2023-12-05 ENCOUNTER — Encounter: Payer: Self-pay | Admitting: Family Medicine

## 2023-12-05 DIAGNOSIS — E538 Deficiency of other specified B group vitamins: Secondary | ICD-10-CM

## 2023-12-05 LAB — IRON,TIBC AND FERRITIN PANEL
%SAT: 20 % (ref 16–45)
Ferritin: 43 ng/mL (ref 16–288)
Iron: 52 ug/dL (ref 45–160)
TIBC: 262 ug/dL (ref 250–450)

## 2023-12-10 ENCOUNTER — Other Ambulatory Visit: Payer: Self-pay

## 2023-12-10 DIAGNOSIS — I7409 Other arterial embolism and thrombosis of abdominal aorta: Secondary | ICD-10-CM

## 2023-12-10 DIAGNOSIS — I739 Peripheral vascular disease, unspecified: Secondary | ICD-10-CM

## 2023-12-10 MED ORDER — CYANOCOBALAMIN 1000 MCG/ML IJ SOLN: 1000.0000 ug | Freq: Once | INTRAMUSCULAR | 3 refills | Status: AC

## 2023-12-12 ENCOUNTER — Telehealth: Payer: Self-pay | Admitting: *Deleted

## 2023-12-12 NOTE — Telephone Encounter (Signed)
Called pharmacy and gave direction.

## 2023-12-12 NOTE — Telephone Encounter (Signed)
 Copied from CRM [id number]. Topic: Clinical - Prescription Issue >> Dec 12, 2023 10:39 AM Berneda FALCON wrote: Reason for CRM: Kristen from [facility] pharmacy states that the order for the vitamin b12 injection (which I do not see on the med list) are missing the frequency.  [phone]

## 2023-12-19 ENCOUNTER — Encounter: Payer: Self-pay | Admitting: Family Medicine

## 2023-12-19 ENCOUNTER — Ambulatory Visit (INDEPENDENT_AMBULATORY_CARE_PROVIDER_SITE_OTHER): Admitting: Family Medicine

## 2023-12-19 VITALS — BP 142/76 | HR 67 | Temp 97.8°F | Ht 58.5 in | Wt 139.0 lb

## 2023-12-19 DIAGNOSIS — N289 Disorder of kidney and ureter, unspecified: Secondary | ICD-10-CM | POA: Diagnosis not present

## 2023-12-19 DIAGNOSIS — R42 Dizziness and giddiness: Secondary | ICD-10-CM | POA: Diagnosis not present

## 2023-12-19 DIAGNOSIS — E538 Deficiency of other specified B group vitamins: Secondary | ICD-10-CM

## 2023-12-19 DIAGNOSIS — I10 Essential (primary) hypertension: Secondary | ICD-10-CM

## 2023-12-19 NOTE — Progress Notes (Signed)
 Established Patient Office Visit   Subjective:  Patient ID: Diane Giles, female    DOB: 1955/05/11  Age: 68 y.o. MRN: [id number]  Chief Complaint  Patient presents with   Medical Management of Chronic Issues    2 week follow up     HPI Patient is here for a two week follow up from previous concerns:   Patient is complaining of episodes of fogginess, like she gradually her energy will be depleted, becomes wobble. Not had a fall. She denies it being dizziness, but spouse reports he feels like it dizziness. She denies feeling her self or the room spinning. Started at least 2 weeks ago. Usually occurs daily, mostly in the morning, but can occur during the day. This has occurred 3 times in a row in the morning. Usually occurs 2-3 times a day. This has occurred while resting, after exercising, getting out of pool.   She takes Atenolol 25mg  daily and Losartan 50mg  daily in the morning. She reports her blood pressures have been ranging 113-164/51-66. The highest reading have been in the afternoon or evening. The lowest readings have been in the morning.    She reports she drinks about 64 oz a day, mostly Gatorade is fluid intake. Not been staying outside as much. When she does stay outside, not drinking additional water ____________________________________________________________________________ Labs were drawn at last visit with her vitamin B12 being (161) low and kidney function being low. Recommended to hydrate with fluids to have them redrawn today. She has been hydrating and feeling a little better. Still having some of the fogginess. She had her first vitamin B12 injection today. She is doing those at home.   Patient has been monitoring her BPs, ranging 105-164/48-89. On her log, she has reported once detected irregular HR, but not had any symptoms. Unsure if this was accurate. She had two episodes of fogginess where her BP was 105/48, 120/48 and 154/67, 136/84. Denies feeling  palpitations.   ROS See HPI above     Objective:   BP (!) 142/76   Pulse 67   Temp 97.8 F (36.6 C) (Oral)   Ht 4' 10.5 (1.486 m)   Wt 139 lb (63 kg)   LMP 05/07/2005 (Approximate)   SpO2 96%   BMI 28.56 kg/m    Physical Exam Vitals reviewed.  Constitutional:      General: She is not in acute distress.    Appearance: Normal appearance. She is not ill-appearing, toxic-appearing or diaphoretic.  Eyes:     General:        Right eye: No discharge.        Left eye: No discharge.     Conjunctiva/sclera: Conjunctivae normal.  Cardiovascular:     Rate and Rhythm: Normal rate and regular rhythm.     Heart sounds: Murmur heard.     No friction rub. No gallop.  Pulmonary:     Effort: Pulmonary effort is normal. No respiratory distress.     Breath sounds: Normal breath sounds.  Musculoskeletal:        General: Normal range of motion.     Right lower leg: Edema (+1) present.     Left lower leg: Edema (+1) present.  Skin:    General: Skin is warm and dry.  Neurological:     General: No focal deficit present.     Mental Status: She is alert and oriented to person, place, and time. Mental status is at baseline.  Psychiatric:  Mood and Affect: Mood normal.        Behavior: Behavior normal.        Thought Content: Thought content normal.        Judgment: Judgment normal.      Assessment & Plan:  Function kidney decreased -     Comprehensive metabolic panel with GFR  Vitamin B12 deficiency  Essential hypertension, benign  Dizziness  -Ordered lab (CMP) to reassess kidney function. Office will call with lab results and will be availble via MyChart.  -Discussed about possibly nephrology referral if kidney function is still low. -Continue Vitamin B12 injections weekly x 4, then monthly. Advised when she takes the last weekly injection, send a message for a refill. Will send in the monthly injections. Make a lab appointment for 10/30 to have vitamin B12  checked. -Concerned that some of the symptoms could be either related to cardiac or neurology. Discussed about possible CT head scan, but through shared decision making, decided to wait until seen by cardiology on 09/04.  -Continue to stay hydrated.  -Blood pressure is mildly elevated today. With wide changes in blood pressure readings, will not change blood pressure medication and let cardiology evaluate.  -Follow up in September as scheduled.  Return for Lab: 10/30 vitamin B12 .   Emmanual Gauthreaux, NP

## 2023-12-19 NOTE — Patient Instructions (Addendum)
-  It was good to see you today. -Ordered lab to reassess kidney function. Office will call with lab results and will be availble via MyChart.  -Discussed about possibly nephrology referral if kidney function is still low. -Continue Vitamin B12 injections weekly x 4, then monthly. When you take the last weekly injection, send a message for a refill. Will send in the monthly injections. Make a lab appointment for 10/30 to have vitamin B12 checked. -Concerned that some of the symptoms could be either related to cardiac or neurology. Discussed about possible CT head scan, but through shared decision making, decided to wait until seen by cardiology on 09/04.  -Continue to stay hydrated.  -Blood pressure is mildly elevated today. With wide changes in blood pressure readings, will not change blood pressure medication and let cardiology evaluate.  -Follow up in September as scheduled.

## 2023-12-20 ENCOUNTER — Ambulatory Visit: Payer: Self-pay | Admitting: Family Medicine

## 2023-12-20 DIAGNOSIS — N289 Disorder of kidney and ureter, unspecified: Secondary | ICD-10-CM

## 2023-12-20 LAB — COMPREHENSIVE METABOLIC PANEL WITH GFR
ALT: 14 U/L (ref 0–35)
AST: 21 U/L (ref 0–37)
Albumin: 3.9 g/dL (ref 3.5–5.2)
Alkaline Phosphatase: 134 U/L — ABNORMAL HIGH (ref 39–117)
BUN: 14 mg/dL (ref 6–23)
CO2: 29 meq/L (ref 19–32)
Calcium: 10 mg/dL (ref 8.4–10.5)
Chloride: 101 meq/L (ref 96–112)
Creatinine, Ser: 1.18 mg/dL (ref 0.40–1.20)
GFR: 46.81 mL/min — ABNORMAL LOW (ref >60.00)
Glucose, Bld: 69 mg/dL — ABNORMAL LOW (ref 70–99)
Potassium: 4.2 meq/L (ref 3.5–5.1)
Sodium: 138 meq/L (ref 135–145)
Total Bilirubin: 0.5 mg/dL (ref 0.2–1.2)
Total Protein: 6.5 g/dL (ref 6.0–8.3)

## 2024-01-07 ENCOUNTER — Encounter (HOSPITAL_BASED_OUTPATIENT_CLINIC_OR_DEPARTMENT_OTHER): Payer: Self-pay

## 2024-01-08 ENCOUNTER — Ambulatory Visit (HOSPITAL_BASED_OUTPATIENT_CLINIC_OR_DEPARTMENT_OTHER): Attending: Physical Medicine and Rehabilitation | Admitting: Physical Therapy

## 2024-01-08 DIAGNOSIS — M6281 Muscle weakness (generalized): Secondary | ICD-10-CM | POA: Diagnosis present

## 2024-01-08 DIAGNOSIS — R2689 Other abnormalities of gait and mobility: Secondary | ICD-10-CM | POA: Insufficient documentation

## 2024-01-08 DIAGNOSIS — M546 Pain in thoracic spine: Secondary | ICD-10-CM | POA: Diagnosis present

## 2024-01-08 DIAGNOSIS — M545 Low back pain, unspecified: Secondary | ICD-10-CM | POA: Insufficient documentation

## 2024-01-08 DIAGNOSIS — R29898 Other symptoms and signs involving the musculoskeletal system: Secondary | ICD-10-CM | POA: Insufficient documentation

## 2024-01-08 DIAGNOSIS — M5116 Intervertebral disc disorders with radiculopathy, lumbar region: Secondary | ICD-10-CM | POA: Diagnosis not present

## 2024-01-08 DIAGNOSIS — M5459 Other low back pain: Secondary | ICD-10-CM | POA: Diagnosis present

## 2024-01-08 DIAGNOSIS — G8929 Other chronic pain: Secondary | ICD-10-CM | POA: Diagnosis not present

## 2024-01-08 NOTE — Therapy (Signed)
 OUTPATIENT PHYSICAL THERAPY THORACOLUMBAR EVALUATION   Patient Name: Diane Giles MRN: [id number] DOB:16-Jan-1956, 68 y.o., female Today's Date: 01/09/2024  END OF SESSION:  PT End of Session - 01/09/24 1538     Visit Number 1    Number of Visits 16    Date for PT Re-Evaluation 03/05/24    Authorization Type MCR    PT Start Time 0930    PT Stop Time 1012    PT Time Calculation (min) 42 min    Activity Tolerance Patient tolerated treatment well    Behavior During Therapy [facility] for tasks assessed/performed          Past Medical History:  Diagnosis Date   Aortic stenosis    mild-moderate AS 04/22/19 echo   Aortoiliac occlusive disease (HCC) 12/03/2016   Arthritis    Bowel obstruction (HCC)    Diabetes mellitus without complication (HCC)    pt states she has been told she is no longer diabetic and is not taking meds   Diverticulitis    Gout    Heart murmur    Hyperlipidemia    Hypertension    Osteoporosis    Sleep apnea    Tobacco use disorder 07/04/2012   Vitamin B12 deficiency    Past Surgical History:  Procedure Laterality Date   ABDOMINAL AORTOGRAM W/LOWER EXTREMITY N/A 12/03/2016   Procedure: Abdominal Aortogram w/Lower Extremity;  Surgeon: Eliza Lonni RAMAN, MD;  Location: [facility] INVASIVE CV LAB;  Service: Cardiovascular;  Laterality: N/A;   AORTA -INNOMIATE BYPASS N/A 05/18/2019   Procedure: AORTA -INNOMIATE BYPASS Using Hemashield Gold Graft Size 8mm;  Surgeon: Lucas Dorise POUR, MD;  Location: [facility] OR;  Service: Open Heart Surgery;  Laterality: N/A;   aorto- fem bypass Bilateral    aortobiiliac bypass in 2000   DILATATION & CURETTAGE/HYSTEROSCOPY WITH MYOSURE N/A 12/27/2015   Procedure: DILATATION & CURETTAGE/HYSTEROSCOPY;  Surgeon: Bobie FORBES Cathlyn JAYSON Nikki, MD;  Location: [facility] ORS;  Service: Gynecology;  Laterality: N/A;   DILATION AND CURETTAGE OF UTERUS  11/2015   ELBOW SURGERY Left ~2007   tendon repair by Dr. Kay   EYE SURGERY Bilateral 2008   lasik    JOINT REPLACEMENT     lower aortic bypass  2000   per patient   PERIPHERAL VASCULAR INTERVENTION  12/03/2016   Stenting of anastomotic stenosis of the aortoiliac and right common iliac artery with 7 x 39 mm VBX covered stent and postdilated with 9 mm balloon.   Right innonimate bypass  05/18/2019   Aorto innominate bypass surgery with median sternotomy   STERNOTOMY  05/18/2019   Right aorto-innonimate bypass surgery 05/18/19 Dr. Lucas   TOTAL HIP ARTHROPLASTY Right 07/27/2014   Procedure: RIGHT TOTAL HIP ARTHROPLASTY ANTERIOR APPROACH;  Surgeon: Donnice Car, MD;  Location: [facility] ORS;  Service: Orthopedics;  Laterality: Right;   TOTAL KNEE ARTHROPLASTY Right 05/28/2022   Procedure: RIGHT TOTAL KNEE ARTHROPLASTY;  Surgeon: Jerri Kay HERO, MD;  Location: [facility] OR;  Service: Orthopedics;  Laterality: Right;   TOTAL KNEE ARTHROPLASTY Left 03/04/2023   Procedure: TOTAL KNEE ARTHROPLASTY;  Surgeon: Jerri Kay HERO, MD;  Location: [facility] OR;  Service: Orthopedics;  Laterality: Left;   Patient Active Problem List   Diagnosis Date Noted   Gout 10/22/2023   Anxiety 10/22/2023   Neuropathy 10/22/2023   SBO (small bowel obstruction) (HCC) 07/13/2023   Status post total left knee replacement 03/04/2023   Primary osteoarthritis of left knee 07/10/2022   Status post total right knee replacement 05/28/2022  Vitamin B12 deficiency 10/31/2021   Small bowel obstruction (HCC)    Partial small bowel obstruction (HCC) 10/28/2021   PAD (peripheral artery disease) (HCC)    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   Aortoiliac occlusive disease (HCC) 12/03/2016   Centrilobular emphysema (HCC) 10/03/2016   Right groin pain 02/27/2016   Sacroiliitis (HCC) 02/27/2016   Postmenopausal bleeding 12/01/2015   Psoas tendinitis of right side 10/21/2015   Endometrial thickening on ultrasound 07/28/2015   S/P right THA, 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   Type 2 diabetes mellitus (HCC) 05/18/2014   DDD (degenerative disc disease)  03/18/2013   Metabolic syndrome 07/29/2012   Essential hypertension, benign 07/18/2012   Hyperlipidemia 07/18/2012   Obstructive sleep apnea 07/04/2012   Osteoporosis 07/04/2012   Morbid obesity (HCC) 07/04/2012    PCP: Philippe Slade NP   REFERRING PROVIDER: Duwaine Pouch NP  REFERRING DIAG:   Rationale for Evaluation and Treatment: Rehabilitation  THERAPY DIAG:  Pain in thoracic spine  Other low back pain  Muscle weakness (generalized)  Other abnormalities of gait and mobility  ONSET DATE:   SUBJECTIVE:                                                                                                                                                                                           SUBJECTIVE STATEMENT: Patient is a 68 year old female who presents to physical therapy with complaints of midthoracic pain.  She also reports she is having increasing frequency and inability to move her legs.  She reports these episodes only last a few minutes.  She is generally walking and feels like she cannot take another step.  When she stands still for 1 to 2 minutes she feels like she has improved function in her legs and can walk.  She reports this started off happening once or twice a year then moved to more monthly and now she feels like is happening daily.  She also presents today with decreased cognition.  She reports she feels like she has been in a fog recently.  Therapy will contact NP and make her aware of the symptoms.  She is advised if the symptoms become severe to seek further medical treatment as soon as possible.  She reports her midthoracic pain started over the past 3 to 4 months.  She has increased pain with activity.  Of note she also has a follow-up with a vascular doctor.  She has a yearly follow-up with her vascular doctor.  She has a history of aortic stenting with a right aortic bypass.  PERTINENT HISTORY:  Left hip replacement, right and left knee replacement,  DMII, Sternotomy  PAIN:  Are you having pain? Yes: NPRS scale:   Pain location: mid thoracic spine  Pain description: aching  Aggravating factors: activity  Relieving factors: comes and goes   PRECAUTIONS: None  RED FLAGS: Changes in mental status.    WEIGHT BEARING RESTRICTIONS: No  FALLS:  Has patient fallen in last 6 months? No but has had several instances where she can't move her legs.   LIVING ENVIRONMENT: Nothing pertinent  OCCUPATION:  Retired   PLOF: Independent  PATIENT GOALS: To return to exercise program  NEXT MD VISIT: Sees vascular doctor next week sees primary care on 7/19  OBJECTIVE:  Note: Objective measures were completed at Evaluation unless otherwise noted.  DIAGNOSTIC FINDINGS:  MRI thoracic spine:  IMPRESSION: 1. Mild thoracic spine spondylosis as described above. 2. No acute osseous injury of the thoracic spine.   Lumbar MRI:  IMPRESSION: 1. Mild lumbar spondylosis without spinal canal stenosis. 2. Central disc protrusion at L5-S1 displaces the traversing left S1 nerve root in the subarticular zone.   PATIENT SURVEYS:  Modified Oswestry 24/50   B/P 128/88 baseline      COGNITION: Overall cognitive status: different from baseline. The patient reports she feels like she is in a fog. This happens several times a week    SENSATION: Feels like legs stop working  MUSCLE LENGTH:  POSTURE: No Significant postural limitations  PALPATION: Tenderness to palpation of bilateral thoracic paraspinals  Thoracic ROM:   AROM eval  Flexion   Extension   Right lateral flexion   Left lateral flexion   Right rotation   Left rotation    (Blank rows = not tested)   LOWER EXTREMITY MMT:    MMT Right eval Left eval  Hip flexion 16.9 27.2  Hip extension    Hip abduction 16.7 16.6  Hip adduction    Hip internal rotation    Hip external rotation    Knee flexion    Knee extension 18.5 24.8  Ankle dorsiflexion    Ankle  plantarflexion    Ankle inversion    Ankle eversion     (Blank rows = not tested)    GAIT: Decreased bilateral hip flexion increased lateral movement TREATMENT DATE:  Manual: Trigger point release to bilateral paraspinals Review of self trigger point release using tennis ball  TherEX: Mid to lower back table stretch forward 5 x 5-second hold Scap retraction keeping RPE low at this time until cleared by vascular doctor 2 x 10 red                                                                                                                                 PATIENT EDUCATION:  Education details: HEP, symptom management, importance of contacting men MD if symptoms worsen Person educated: Patient Education method: Explanation, Demonstration, Tactile cues, Verbal cues, and Handouts Education comprehension: verbalized understanding, returned demonstration, verbal cues required, tactile cues required, and needs further education  HOME EXERCISE PROGRAM: Access Code: [email or web address] URL: [email or web address] Date: 01/09/2024 Prepared by: Alm Don  Exercises - Standing Glute Med Mobilization with Small Ball on Wall  - 1 x daily - 7 x weekly - 3 sets - 1 min  hold - Seated Bilateral Shoulder Flexion Towel Slide at Table Top  - 1 x daily - 7 x weekly - 3 sets - 5 reps - 10 sec hold  hold - Standing Bilateral Low Shoulder Row with Anchored Resistance  - 1 x daily - 7 x weekly - 3 sets - 10 reps  ASSESSMENT:  CLINICAL IMPRESSION: Patient is a 68 year old female presents to physical therapy with mid thoracic pain.  She also presents with cognitive symptoms.  She also reports she has had progressive frequency of difficulty ambulating.  She reports her legs stop working for a period of time.  She is generally able to start walking again.  She will see a vascular specialist next week.  She has a history of aortic bypass.  She was shown exercises to reduce pain in mid thoracic spine.   She was strongly advised of any of these activities increased pain to stop.  She does have spasming in her mid thoracic paraspinals.  She has mild pain with endrange rotation.  She has a history of low back pain but has no significant pain at this time.  We will contact her NP about above symptoms.  She is advised if the symptoms worsen to seek further medical treatment sooner.  She was given a postural exercise but advised not to perform that increases pain.  She reported a decrease in pain with postural exercise.  We will proceed pending vascular findings and progression of cognitive symptoms.  OBJECTIVE IMPAIRMENTS: Abnormal gait, decreased activity tolerance, decreased cognition, decreased endurance, difficulty walking, decreased ROM, decreased strength, and pain.   ACTIVITY LIMITATIONS: carrying, lifting, standing, squatting, stairs, transfers, reach over head, and locomotion level  PARTICIPATION LIMITATIONS: meal prep, cleaning, laundry, driving, shopping, community activity, and yard work  PERSONAL FACTORS: 3+ comorbidity bilateral knee replacements: Aortic bypass: History of chronic low back pain  are also affecting patient's functional outcome.   REHAB POTENTIAL: Good  CLINICAL DECISION MAKING: Unstable/unpredictable fluctuating instances of inability to use her legs.  Intermittent decreases in cognitive function  EVALUATION COMPLEXITY: High   GOALS: Goals reviewed with patient? Yes  SHORT TERM GOALS: Target date: 02/06/2024    Patient will increase bilateral thoracic rotation to end range without pain Baseline: Goal status: INITIAL  2.  Patient will report a 50% reduction in pain in thoracic area Baseline:  Goal status: INITIAL  3.  Patient will be independent with basic HEP when cleared by vascular doctor Baseline:  Goal status: INITIAL  4.  Patient will increase gross bilateral lower extremity strength by Baseline:  Goal status: INITIAL   LONG TERM GOALS: Target  date: 03/05/2024    Patient will return to full gym program Baseline:  Goal status: INITIAL  2.  Patient will perform daily tasks without increase of thoracic spine pain Baseline:  Goal status: INITIAL  3.  Patient will demonstrate equal left and right lower extremity strength in order to perform daily tasks Baseline:  Goal status: INITIAL PLAN:  PT FREQUENCY: 2x/week  PT DURATION: 8 weeks  PLANNED INTERVENTIONS: 97110-Therapeutic exercises, 97530- Therapeutic activity, [id number]- Neuromuscular re-education, 97535- Self Care, [id number]- Manual therapy, [id number]- Gait training, [phone]- Aquatic Therapy, ),Patient/Family education, Stair training, Taping, Dry Needling, DME instructions, Cryotherapy, and Moist heat  PLAN  FOR NEXT SESSION: Continue to work on soft tissue mobilization of the thoracic spine and general mobility of thoracic spine.  Once cleared by vascular doctor we can begin lower extremity strengthening and postural strengthening.  Patient is also recently seen cardiologist.  NP made aware of decline in cognitive status.   Alm JINNY Don, PT 01/09/2024, 3:41 PM

## 2024-01-08 NOTE — Progress Notes (Unsigned)
 Cardiology Office Note    Patient Name: MICHAELENE DUTAN Date of Encounter: 01/08/2024  Primary Care Provider:  Billy Philippe SAUNDERS, NP Primary Cardiologist:  None Primary Electrophysiologist: None   Past Medical History    Past Medical History:  Diagnosis Date   Aortic stenosis    mild-moderate AS 04/22/19 echo   Aortoiliac occlusive disease (HCC) 12/03/2016   Arthritis    Bowel obstruction (HCC)    Diabetes mellitus without complication (HCC)    pt states she has been told she is no longer diabetic and is not taking meds   Diverticulitis    Gout    Heart murmur    Hyperlipidemia    Hypertension    Osteoporosis    Sleep apnea    Tobacco use disorder 07/04/2012   Vitamin B12 deficiency     History of Present Illness  Rosann Gorum Gerdts is a 68 y.o. female with a PMH of PAD s/p aortoiliac bypass 07/1998 and aortoiliac stenting in 2018, with aorto innominate bypass 2021, aortic stenosis, HTN, HLD, mild nonobstructive CAD, DM type II, prior tobacco abuse who presents today for follow-up and discussion of CT results.  Ms. Bickham presents today for follow-up and to discuss recent CT results.  She recently completed a lung cancer screening that showed three-vessel calcium  as well as aortic atherosclerosis.  She previously had a coronary CTA completed in 2020 that showed minimal nonobstructive disease.  She was recently seen by her PCP and was noted to have mild elevation of blood pressure.  She also reported episodes of fogginess and low energy.  Her last 2D echo in 12/2020 that showed hyperdynamic EF of 70-75% with no RWMA and mild aortic stenosis.  Discussed the use of AI scribe software for clinical note transcription with the patient, who gave verbal consent to proceed.  History of Present Illness    ***Notes:   Review of Systems  Please see the history of present illness.    All other systems reviewed and are otherwise negative except as noted above.  Physical Exam     Wt Readings from Last 3 Encounters:  12/19/23 139 lb (63 kg)  12/04/23 140 lb 9.6 oz (63.8 kg)  11/01/23 142 lb (64.4 kg)   CD:Uyzmz were no vitals filed for this visit.,There is no height or weight on file to calculate BMI. GEN: Well nourished, well developed in no acute distress Neck: No JVD; No carotid bruits Pulmonary: Clear to auscultation without rales, wheezing or rhonchi  Cardiovascular: Normal rate. Regular rhythm. Normal S1. Normal S2.   Murmurs: There is no murmur.  ABDOMEN: Soft, non-tender, non-distended EXTREMITIES:  No edema; No deformity   EKG/LABS/ Recent Cardiac Studies   ECG personally reviewed by me today - ***  Risk Assessment/Calculations:   {Does this patient have ATRIAL FIBRILLATION?:[phone]}      Lab Results  Component Value Date   WBC 5.2 12/04/2023   HGB 12.0 12/04/2023   HCT 35.6 (L) 12/04/2023   MCV 89.4 12/04/2023   PLT 227.0 12/04/2023   Lab Results  Component Value Date   CREATININE 1.18 12/19/2023   BUN 14 12/19/2023   NA 138 12/19/2023   K 4.2 12/19/2023   CL 101 12/19/2023   CO2 29 12/19/2023   Lab Results  Component Value Date   CHOL 129 10/22/2023   HDL 50.90 10/22/2023   LDLCALC 56 10/22/2023   TRIG 113.0 10/22/2023   CHOLHDL 3 10/22/2023    Lab Results  Component Value Date  HGBA1C 5.8 10/22/2023   Assessment & Plan    Assessment and Plan Assessment & Plan     1. History of nonobstructive CAD: -Coronary CTA completed 2020 showing minimal nonobstructive CAD present. -Today patient reports  2.Essential hypertension: -Patient's last blood pressure was   3. Hyperlipidemia: -Patient's last LDL cholesterol was 38 at goal -Continue Lipitor  80 mg daily, ezetimibe  10 mg daily  4.History of PAD: -s/p aortoiliac bypass in 2000 with subsequent stenting in 2018, aorto innominate bypass 05/2019  5.  Fatigue      Disposition: Follow-up with None or APP in *** months {Are you ordering a CV Procedure (e.g. stress  test, cath, DCCV, TEE, etc)?   Press F2        :[id number]}   Signed, Wyn Raddle, Jackee Shove, NP 01/08/2024, 5:46 PM [facility]

## 2024-01-09 ENCOUNTER — Encounter (HOSPITAL_BASED_OUTPATIENT_CLINIC_OR_DEPARTMENT_OTHER): Payer: Self-pay | Admitting: Physical Therapy

## 2024-01-09 ENCOUNTER — Encounter: Payer: Self-pay | Admitting: Nurse Practitioner

## 2024-01-09 ENCOUNTER — Ambulatory Visit: Attending: Nurse Practitioner | Admitting: Nurse Practitioner

## 2024-01-09 VITALS — BP 144/84 | HR 73 | Ht 58.5 in | Wt 140.6 lb

## 2024-01-09 DIAGNOSIS — I7409 Other arterial embolism and thrombosis of abdominal aorta: Secondary | ICD-10-CM | POA: Diagnosis present

## 2024-01-09 DIAGNOSIS — I2510 Atherosclerotic heart disease of native coronary artery without angina pectoris: Secondary | ICD-10-CM | POA: Insufficient documentation

## 2024-01-09 DIAGNOSIS — I10 Essential (primary) hypertension: Secondary | ICD-10-CM | POA: Insufficient documentation

## 2024-01-09 DIAGNOSIS — R5383 Other fatigue: Secondary | ICD-10-CM | POA: Diagnosis present

## 2024-01-09 DIAGNOSIS — E7800 Pure hypercholesterolemia, unspecified: Secondary | ICD-10-CM | POA: Diagnosis present

## 2024-01-09 MED ORDER — CARVEDILOL 6.25 MG PO TABS: 6.2500 mg | ORAL_TABLET | Freq: Two times a day (BID) | ORAL | 1 refills | Status: DC

## 2024-01-09 NOTE — Patient Instructions (Signed)
 Medication Instructions:  STOP Atenolol  START Coreg  (Carvedilol ) 6.25mg  Take 1 tablet twice a day  *If you need a refill on your cardiac medications before your next appointment, please call your pharmacy*  Lab Work: TODAY-LP(a) If you have labs (blood work) drawn today and your tests are completely normal, you will receive your results only by: MyChart Message (if you have MyChart) OR A paper copy in the mail If you have any lab test that is abnormal or we need to change your treatment, we will call you to review the results.  Testing/Procedures: Your physician has requested that you have an exercise stress myoview. For further information please visit [email or web address] Please follow instruction sheet, as given.  Follow-Up: At [facility], you and your health needs are our priority.  As part of our continuing mission to provide you with exceptional heart care, our providers are all part of one team.  This team includes your primary Cardiologist (physician) and Advanced Practice Providers or APPs (Physician Assistants and Nurse Practitioners) who all work together to provide you with the care you need, when you need it.  Your next appointment:   6 month(s)  Provider:   Gordy Bergamo, MD or ANY APP    We recommend signing up for the patient portal called MyChart.  Sign up information is provided on this After Visit Summary.  MyChart is used to connect with patients for Virtual Visits (Telemedicine).  Patients are able to view lab/test results, encounter notes, upcoming appointments, etc.  Non-urgent messages can be sent to your provider as well.   To learn more about what you can do with MyChart, go to [email or web address].   Other Instructions

## 2024-01-10 ENCOUNTER — Ambulatory Visit: Payer: Self-pay | Admitting: Nurse Practitioner

## 2024-01-10 ENCOUNTER — Encounter (HOSPITAL_COMMUNITY): Payer: Self-pay | Admitting: *Deleted

## 2024-01-10 ENCOUNTER — Ambulatory Visit (HOSPITAL_BASED_OUTPATIENT_CLINIC_OR_DEPARTMENT_OTHER)
Admission: RE | Admit: 2024-01-10 | Discharge: 2024-01-10 | Disposition: A | Discharge status: Home / Self Care | Source: Ambulatory Visit

## 2024-01-10 ENCOUNTER — Ambulatory Visit (HOSPITAL_COMMUNITY)
Admission: RE | Admit: 2024-01-10 | Discharge: 2024-01-10 | Disposition: A | Discharge status: Home / Self Care | Source: Ambulatory Visit | Attending: Vascular Surgery | Admitting: Vascular Surgery

## 2024-01-10 ENCOUNTER — Ambulatory Visit (INDEPENDENT_AMBULATORY_CARE_PROVIDER_SITE_OTHER): Admitting: Physician Assistant

## 2024-01-10 VITALS — BP 141/77 | HR 64 | Temp 97.8°F | Wt 140.4 lb

## 2024-01-10 DIAGNOSIS — I739 Peripheral vascular disease, unspecified: Secondary | ICD-10-CM

## 2024-01-10 DIAGNOSIS — I7409 Other arterial embolism and thrombosis of abdominal aorta: Secondary | ICD-10-CM

## 2024-01-10 DIAGNOSIS — I708 Atherosclerosis of other arteries: Secondary | ICD-10-CM

## 2024-01-10 DIAGNOSIS — I771 Stricture of artery: Secondary | ICD-10-CM | POA: Diagnosis not present

## 2024-01-10 DIAGNOSIS — I2510 Atherosclerotic heart disease of native coronary artery without angina pectoris: Secondary | ICD-10-CM

## 2024-01-10 LAB — VAS US ABI WITH/WO TBI
Left ABI: 1.11
Right ABI: 1.09

## 2024-01-10 LAB — LIPOPROTEIN A (LPA): Lipoprotein (a): 73.4 nmol/L (ref <75.0)

## 2024-01-10 NOTE — Progress Notes (Signed)
 Office Note     CC:  follow up Requesting Provider:  Billy Philippe SAUNDERS, NP  HPI: Diane Giles is a 68 y.o. (20-Jan-1956) female who presents for surveillance of PAD.  She has history of an aortobiiliac bypass in 2007 by Dr. Eliza.  She developed stenosis in the proximal right limb of her graft in 2018 and underwent angioplasty and stenting with VBX.  Surgical history also significant for aorto innominate bypass by Dr. Lucas and Dr. Eliza in January 2021.  She denies any claudication, rest pain, or tissue loss of bilateral lower extremities.  Her chief complaint is of episodes of bilateral lower extremity weakness.  These episodes were only occurring occasionally however have recently become daily.  She describes weakness that shoots down both legs when she first stands up from sitting.  She has history of lumbar spine pathology and has received spine injections in the past.  She was recently referred to physical therapy and has completed her first treatment this week.  She states these episodes are becoming debilitating.  She is on aspirin  and statin daily.  She denies tobacco use.   Past Medical History:  Diagnosis Date   Aortic stenosis    mild-moderate AS 04/22/19 echo   Aortoiliac occlusive disease (HCC) 12/03/2016   Arthritis    Bowel obstruction (HCC)    Diabetes mellitus without complication (HCC)    pt states she has been told she is no longer diabetic and is not taking meds   Diverticulitis    Gout    Heart murmur    Hyperlipidemia    Hypertension    Osteoporosis    Sleep apnea    Tobacco use disorder 07/04/2012   Vitamin B12 deficiency     Past Surgical History:  Procedure Laterality Date   ABDOMINAL AORTOGRAM W/LOWER EXTREMITY N/A 12/03/2016   Procedure: Abdominal Aortogram w/Lower Extremity;  Surgeon: Eliza Lonni RAMAN, MD;  Location: [facility];  Service: Cardiovascular;  Laterality: N/A;   AORTA -INNOMIATE BYPASS N/A 05/18/2019   Procedure:  AORTA -INNOMIATE BYPASS Using Hemashield Gold Graft Size 8mm;  Surgeon: Lucas Dorise POUR, MD;  Location: [facility] OR;  Service: Open Heart Surgery;  Laterality: N/A;   aorto- fem bypass Bilateral    aortobiiliac bypass in 2000   DILATATION & CURETTAGE/HYSTEROSCOPY WITH MYOSURE N/A 12/27/2015   Procedure: DILATATION & CURETTAGE/HYSTEROSCOPY;  Surgeon: Bobie FORBES Cathlyn JAYSON Nikki, MD;  Location: [facility];  Service: Gynecology;  Laterality: N/A;   DILATION AND CURETTAGE OF UTERUS  11/2015   ELBOW SURGERY Left ~2007   tendon repair by Dr. Kay   EYE SURGERY Bilateral 2008   lasik   JOINT REPLACEMENT     lower aortic bypass  2000   per patient   PERIPHERAL VASCULAR INTERVENTION  12/03/2016   Stenting of anastomotic stenosis of the aortoiliac and right common iliac artery with 7 x 39 mm VBX covered stent and postdilated with 9 mm balloon.   Right innonimate bypass  05/18/2019   Aorto innominate bypass surgery with median sternotomy   STERNOTOMY  05/18/2019   Right aorto-innonimate bypass surgery 05/18/19 Dr. Lucas   TOTAL HIP ARTHROPLASTY Right 07/27/2014   Procedure: RIGHT TOTAL HIP ARTHROPLASTY ANTERIOR APPROACH;  Surgeon: Donnice Car, MD;  Location: [facility];  Service: Orthopedics;  Laterality: Right;   TOTAL KNEE ARTHROPLASTY Right 05/28/2022   Procedure: RIGHT TOTAL KNEE ARTHROPLASTY;  Surgeon: Jerri Kay HERO, MD;  Location: [facility];  Service: Orthopedics;  Laterality: Right;  TOTAL KNEE ARTHROPLASTY Left 03/04/2023   Procedure: TOTAL KNEE ARTHROPLASTY;  Surgeon: Jerri Kay HERO, MD;  Location: [facility] OR;  Service: Orthopedics;  Laterality: Left;    Social History   Socioeconomic History   Marital status: Married    Spouse name: Not on file   Number of children: 5   Years of education: Not on file   Highest education level: Some college, no degree  Occupational History   Occupation: Tree surgeon  Tobacco Use   Smoking status: Former    Current packs/day: 0.00    Average packs/day: 0.5  packs/day for 40.0 years (20.0 ttl pk-yrs)    Types: Cigarettes    Start date: 02/23/1977    Quit date: 02/23/2017    Years since quitting: 6.8   Smokeless tobacco: Never  Vaping Use   Vaping status: Never Used  Substance and Sexual Activity   Alcohol use: Yes    Comment: very rare--maybe once a year   Drug use: No   Sexual activity: Yes    Partners: Male    Birth control/protection: Post-menopausal  Other Topics Concern   Not on file  Social History Narrative   Married. Patient does not exercise. She has a Geographical information systems officer.   Right handed   Lives with husband in two story home   Social Drivers of Health   Financial Resource Strain: Low Risk  (10/21/2023)   Overall Financial Resource Strain (CARDIA)    Difficulty of Paying Living Expenses: Not very hard  Food Insecurity: No Food Insecurity (10/21/2023)   Hunger Vital Sign    Worried About Running Out of Food in the Last Year: Never true    Ran Out of Food in the Last Year: Never true  Transportation Needs: No Transportation Needs (10/21/2023)   PRAPARE - Administrator, Civil Service (Medical): No    Lack of Transportation (Non-Medical): No  Physical Activity: Sufficiently Active (10/21/2023)   Exercise Vital Sign    Days of Exercise per Week: 5 days    Minutes of Exercise per Session: 60 min  Stress: No Stress Concern Present (10/21/2023)   [employer] of Occupational Health - Occupational Stress Questionnaire    Feeling of Stress: Not at all  Social Connections: Socially Isolated (10/21/2023)   Social Connection and Isolation Panel    Frequency of Communication with Friends and Family: Once a week    Frequency of Social Gatherings with Friends and Family: Once a week    Attends Religious Services: Never    Database administrator or Organizations: No    Attends Engineer, structural: Not on file    Marital Status: Married  Catering manager Violence: Not At Risk (07/13/2023)   Humiliation, Afraid,  Rape, and Kick questionnaire    Fear of Current or Ex-Partner: No    Emotionally Abused: No    Physically Abused: No    Sexually Abused: No    Family History  Problem Relation Age of Onset   Migraines Mother    Thyroid  disease Mother        hypothyroid   Cancer Father 58       Dec age 19 with pancreatic Ca   Rheum arthritis Daughter    Migraines Daughter     Current Outpatient Medications  Medication Sig Dispense Refill   allopurinol  (ZYLOPRIM ) 100 MG tablet Take 1 tablet by mouth once daily for uric acid/gout. 30 tablet 1   aspirin  EC 81 MG tablet Take 1 tablet (81  mg total) by mouth 2 (two) times daily. To be taken after surgery 84 tablet 0   atorvastatin  (LIPITOR ) 80 MG tablet Take 1 tablet by mouth every evening for cholesterol. 30 tablet 1   buPROPion  (WELLBUTRIN  XL) 150 MG 24 hr tablet Take 1 tablet by mouth in the morning for depression/anxiety. 30 tablet 1   CALCIUM  600 1500 (600 Ca) MG TABS tablet Take 1 tablet by mouth daily.     carvedilol  (COREG ) 6.25 MG tablet Take 1 tablet (6.25 mg total) by mouth 2 (two) times daily. 180 tablet 1   cholecalciferol  (VITAMIN D3) 25 MCG (1000 UNIT) tablet Take 1 tablet (25 mcg total) by mouth every morning. 90 tablet 4   dapagliflozin  propanediol (FARXIGA ) 10 MG TABS tablet Take 1 tablet (10 mg total) by mouth daily. 90 tablet 1   ezetimibe  (ZETIA ) 10 MG tablet Take 1 tablet by mouth once daily for cholesterol. 30 tablet 1   losartan  (COZAAR ) 50 MG tablet Take 1 tablet (50 mg total) by mouth daily for blood pressure. If running above 130/80, increase to 2 tablets daily as directed (Patient taking differently: Take 50 mg by mouth daily.) 90 tablet 4   magnesium  oxide (MAG-OX) 400 (240 Mg) MG tablet Take 400 mg by mouth daily.     OZEMPIC, 0.25 OR 0.5 MG/DOSE, 2 MG/3ML SOPN Inject 0.5 mg into the skin once a week. Pt takes on Mondays     pregabalin  (LYRICA ) 75 MG capsule Take 1 capsule by mouth twice daily. 60 capsule 0   No current  facility-administered medications for this visit.    Allergies  Allergen Reactions   Codeine Itching     REVIEW OF SYSTEMS:  Negative unless noted in HPI [X]  denotes positive finding, [ ]  denotes negative finding Cardiac  Comments:  Chest pain or chest pressure:    Shortness of breath upon exertion:    Short of breath when lying flat:    Irregular heart rhythm:        Vascular    Pain in calf, thigh, or hip brought on by ambulation:    Pain in feet at night that wakes you up from your sleep:     Blood clot in your veins:    Leg swelling:         Pulmonary    Oxygen at home:    Productive cough:     Wheezing:         Neurologic    Sudden weakness in arms or legs:     Sudden numbness in arms or legs:     Sudden onset of difficulty speaking or slurred speech:    Temporary loss of vision in one eye:     Problems with dizziness:         Gastrointestinal    Blood in stool:     Vomited blood:         Genitourinary    Burning when urinating:     Blood in urine:        Psychiatric    Major depression:         Hematologic    Bleeding problems:    Problems with blood clotting too easily:        Skin    Rashes or ulcers:        Constitutional    Fever or chills:      PHYSICAL EXAMINATION:  Vitals:   01/10/24 1039  BP: (!) 141/77  Pulse: 64  Temp: 97.8  F (36.6 C)  TempSrc: Temporal  Weight: 140 lb 6.4 oz (63.7 kg)    General:  WDWN in NAD; vital signs documented above Gait: Not observed HENT: WNL, normocephalic Pulmonary: normal non-labored breathing Cardiac: regular HR Abdomen: soft, NT, no masses Skin: without rashes Vascular Exam/Pulses: palpable DP pulses; palpable R radial pulse Extremities: without ischemic changes, without Gangrene , without cellulitis; without open wounds;  Musculoskeletal: no muscle wasting or atrophy  Neurologic: A&O X 3 Psychiatric:  The pt has Normal affect.   Non-Invasive Vascular Imaging:   Slightly elevated  velocity 249 cm/s in the inflow to the right and left limb of the aortobiiliac bypass  Right limb VBX widely patent  ABI/TBIToday's ABIToday's TBIPrevious ABIPrevious TBI  +-------+-----------+-----------+------------+------------+  Right 1.09       0.61       1.23        0.61          +-------+-----------+-----------+------------+------------+  Left  1.11       0.45       1.06        0.64            ASSESSMENT/PLAN:: 69 y.o. female here for follow up for surveillance of PAD with history of aortobiiliac bypass graft  Duplex demonstrates a widely patent aortobiiliac bypass graft.  She does have mild velocity elevation both limbs however on exam she has palpable pedal pulses and her ABIs/TBI's have not changed.  She will continue her aspirin  and statin daily.  We will repeat imaging in 1 year.  I encouraged her to walk for exercise.  Ms. Ribera's main concern is of episodic weakness in both legs.  This occurs mainly when standing after sitting for period of time.  Etiology is unknown however it is not typical for arterial insufficiency or ischemia.  Encouraged her to continue with physical therapy.  She may also benefit from referral to neurology given her symptoms.   Donnice Sender, PA-C [facility] [phone]  Clinic MD:   [place]

## 2024-01-15 ENCOUNTER — Ambulatory Visit (HOSPITAL_BASED_OUTPATIENT_CLINIC_OR_DEPARTMENT_OTHER): Admitting: Physical Therapy

## 2024-01-15 ENCOUNTER — Encounter (HOSPITAL_BASED_OUTPATIENT_CLINIC_OR_DEPARTMENT_OTHER): Payer: Self-pay | Admitting: Physical Therapy

## 2024-01-15 ENCOUNTER — Other Ambulatory Visit: Payer: Self-pay | Admitting: Nurse Practitioner

## 2024-01-15 ENCOUNTER — Encounter: Payer: Self-pay | Admitting: Family Medicine

## 2024-01-15 ENCOUNTER — Encounter: Payer: Self-pay | Admitting: Neurology

## 2024-01-15 DIAGNOSIS — I7409 Other arterial embolism and thrombosis of abdominal aorta: Secondary | ICD-10-CM

## 2024-01-15 DIAGNOSIS — I2510 Atherosclerotic heart disease of native coronary artery without angina pectoris: Secondary | ICD-10-CM

## 2024-01-15 DIAGNOSIS — M5459 Other low back pain: Secondary | ICD-10-CM

## 2024-01-15 DIAGNOSIS — R5383 Other fatigue: Secondary | ICD-10-CM

## 2024-01-15 DIAGNOSIS — M546 Pain in thoracic spine: Secondary | ICD-10-CM | POA: Diagnosis not present

## 2024-01-15 DIAGNOSIS — I10 Essential (primary) hypertension: Secondary | ICD-10-CM

## 2024-01-15 DIAGNOSIS — R29898 Other symptoms and signs involving the musculoskeletal system: Secondary | ICD-10-CM

## 2024-01-15 DIAGNOSIS — M6281 Muscle weakness (generalized): Secondary | ICD-10-CM

## 2024-01-15 DIAGNOSIS — E7800 Pure hypercholesterolemia, unspecified: Secondary | ICD-10-CM

## 2024-01-15 DIAGNOSIS — R2689 Other abnormalities of gait and mobility: Secondary | ICD-10-CM

## 2024-01-15 NOTE — Therapy (Signed)
 OUTPATIENT PHYSICAL THERAPY THORACOLUMBAR EVALUATION   Patient Name: Diane Giles MRN: [id number] DOB:01/30/1956, 68 y.o., female Today's Date: 01/15/2024  END OF SESSION:  PT End of Session - 01/15/24 1238     Visit Number 2    Number of Visits 16    Date for PT Re-Evaluation 03/05/24    Authorization Type MCR    PT Start Time 1145    PT Stop Time 1224    PT Time Calculation (min) 39 min    Activity Tolerance Patient tolerated treatment well    Behavior During Therapy [facility] for tasks assessed/performed           Past Medical History:  Diagnosis Date   Aortic stenosis    mild-moderate AS 04/22/19 echo   Aortoiliac occlusive disease (HCC) 12/03/2016   Arthritis    Bowel obstruction (HCC)    Diabetes mellitus without complication (HCC)    pt states she has been told she is no longer diabetic and is not taking meds   Diverticulitis    Gout    Heart murmur    Hyperlipidemia    Hypertension    Osteoporosis    Sleep apnea    Tobacco use disorder 07/04/2012   Vitamin B12 deficiency    Past Surgical History:  Procedure Laterality Date   ABDOMINAL AORTOGRAM W/LOWER EXTREMITY N/A 12/03/2016   Procedure: Abdominal Aortogram w/Lower Extremity;  Surgeon: Eliza Lonni RAMAN, MD;  Location: [facility] INVASIVE CV LAB;  Service: Cardiovascular;  Laterality: N/A;   AORTA -INNOMIATE BYPASS N/A 05/18/2019   Procedure: AORTA -INNOMIATE BYPASS Using Hemashield Gold Graft Size 8mm;  Surgeon: Lucas Dorise POUR, MD;  Location: [facility] OR;  Service: Open Heart Surgery;  Laterality: N/A;   aorto- fem bypass Bilateral    aortobiiliac bypass in 2000   DILATATION & CURETTAGE/HYSTEROSCOPY WITH MYOSURE N/A 12/27/2015   Procedure: DILATATION & CURETTAGE/HYSTEROSCOPY;  Surgeon: Bobie FORBES Cathlyn JAYSON Nikki, MD;  Location: [facility] ORS;  Service: Gynecology;  Laterality: N/A;   DILATION AND CURETTAGE OF UTERUS  11/2015   ELBOW SURGERY Left ~2007   tendon repair by Dr. Kay   EYE SURGERY Bilateral 2008   lasik    JOINT REPLACEMENT     lower aortic bypass  2000   per patient   PERIPHERAL VASCULAR INTERVENTION  12/03/2016   Stenting of anastomotic stenosis of the aortoiliac and right common iliac artery with 7 x 39 mm VBX covered stent and postdilated with 9 mm balloon.   Right innonimate bypass  05/18/2019   Aorto innominate bypass surgery with median sternotomy   STERNOTOMY  05/18/2019   Right aorto-innonimate bypass surgery 05/18/19 Dr. Lucas   TOTAL HIP ARTHROPLASTY Right 07/27/2014   Procedure: RIGHT TOTAL HIP ARTHROPLASTY ANTERIOR APPROACH;  Surgeon: Donnice Car, MD;  Location: [facility] ORS;  Service: Orthopedics;  Laterality: Right;   TOTAL KNEE ARTHROPLASTY Right 05/28/2022   Procedure: RIGHT TOTAL KNEE ARTHROPLASTY;  Surgeon: Jerri Kay HERO, MD;  Location: [facility] OR;  Service: Orthopedics;  Laterality: Right;   TOTAL KNEE ARTHROPLASTY Left 03/04/2023   Procedure: TOTAL KNEE ARTHROPLASTY;  Surgeon: Jerri Kay HERO, MD;  Location: [facility] OR;  Service: Orthopedics;  Laterality: Left;   Patient Active Problem List   Diagnosis Date Noted   Gout 10/22/2023   Anxiety 10/22/2023   Neuropathy 10/22/2023   SBO (small bowel obstruction) (HCC) 07/13/2023   Status post total left knee replacement 03/04/2023   Primary osteoarthritis of left knee 07/10/2022   Status post total right knee replacement  05/28/2022   Vitamin B12 deficiency 10/31/2021   Small bowel obstruction (HCC)    Partial small bowel obstruction (HCC) 10/28/2021   PAD (peripheral artery disease) (HCC)    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   Aortoiliac occlusive disease (HCC) 12/03/2016   Centrilobular emphysema (HCC) 10/03/2016   Right groin pain 02/27/2016   Sacroiliitis (HCC) 02/27/2016   Postmenopausal bleeding 12/01/2015   Psoas tendinitis of right side 10/21/2015   Endometrial thickening on ultrasound 07/28/2015   S/P right THA, 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   Type 2 diabetes mellitus (HCC) 05/18/2014   DDD (degenerative disc  disease) 03/18/2013   Metabolic syndrome 07/29/2012   Essential hypertension, benign 07/18/2012   Hyperlipidemia 07/18/2012   Obstructive sleep apnea 07/04/2012   Osteoporosis 07/04/2012   Morbid obesity (HCC) 07/04/2012    PCP: Philippe Slade NP   REFERRING PROVIDER: Duwaine Pouch NP  REFERRING DIAG:   Rationale for Evaluation and Treatment: Rehabilitation  THERAPY DIAG:  Pain in thoracic spine  Other low back pain  Muscle weakness (generalized)  Other abnormalities of gait and mobility  ONSET DATE:   SUBJECTIVE:                                                                                                                                                                                           SUBJECTIVE STATEMENT:  Pt reports less fogginess since coming in. She has been cleared by vascular at this time. Will have a neuro consult put in with PCP. Tennis ball usage has greatly improved her pain greatly.     Eval:  Patient is a 68 year old female who presents to physical therapy with complaints of midthoracic pain.  She also reports she is having increasing frequency and inability to move her legs.  She reports these episodes only last a few minutes.  She is generally walking and feels like she cannot take another step.  When she stands still for 1 to 2 minutes she feels like she has improved function in her legs and can walk.  She reports this started off happening once or twice a year then moved to more monthly and now she feels like is happening daily.  She also presents today with decreased cognition.  She reports she feels like she has been in a fog recently.  Therapy will contact NP and make her aware of the symptoms.  She is advised if the symptoms become severe to seek further medical treatment as soon as possible.  She reports her midthoracic pain started over the past 3 to 4 months.  She has increased pain with activity.  Of note she also has a  follow-up with a  vascular doctor.  She has a yearly follow-up with her vascular doctor.  She has a history of aortic stenting with a right aortic bypass.  PERTINENT HISTORY:  Left hip replacement, right and left knee replacement, DMII, Sternotomy  PAIN:  Are you having pain? No: NPRS scale: 0/10 Pain location: mid thoracic spine  Pain description: aching  Aggravating factors: activity  Relieving factors: comes and goes  PRECAUTIONS: None  RED FLAGS: Changes in mental status.    WEIGHT BEARING RESTRICTIONS: No  FALLS:  Has patient fallen in last 6 months? No but has had several instances where she can't move her legs.   LIVING ENVIRONMENT: Nothing pertinent  OCCUPATION:  Retired   PLOF: Independent  PATIENT GOALS: To return to exercise program  NEXT MD VISIT: Sees vascular doctor next week sees primary care on 7/19  OBJECTIVE:  Note: Objective measures were completed at Evaluation unless otherwise noted.  DIAGNOSTIC FINDINGS:  MRI thoracic spine:  IMPRESSION: 1. Mild thoracic spine spondylosis as described above. 2. No acute osseous injury of the thoracic spine.   Lumbar MRI:  IMPRESSION: 1. Mild lumbar spondylosis without spinal canal stenosis. 2. Central disc protrusion at L5-S1 displaces the traversing left S1 nerve root in the subarticular zone.   PATIENT SURVEYS:  Modified Oswestry 24/50   B/P 128/88 baseline   COGNITION: Overall cognitive status: different from baseline. The patient reports she feels like she is in a fog. This happens several times a week    SENSATION: Feels like legs stop working  MUSCLE LENGTH:  POSTURE: No Significant postural limitations  PALPATION: Tenderness to palpation of bilateral thoracic paraspinals  Thoracic ROM:   AROM eval  Flexion   Extension   Right lateral flexion   Left lateral flexion   Right rotation   Left rotation    (Blank rows = not tested)   LOWER EXTREMITY MMT:    MMT Right eval Left eval  Hip flexion  16.9 27.2  Hip extension    Hip abduction 16.7 16.6  Hip adduction    Hip internal rotation    Hip external rotation    Knee flexion    Knee extension 18.5 24.8  Ankle dorsiflexion    Ankle plantarflexion    Ankle inversion    Ankle eversion     (Blank rows = not tested)    GAIT: Decreased bilateral hip flexion increased lateral movement TREATMENT DATE:   9/10  STM paraspinals of R mid-spine, peri-scapulars R UPA grade III T3-6   Postural changes during driving trips  Seated thoracic extension 10x  Seated pelvic tilts 10x   Previous:  Manual: Trigger point release to bilateral paraspinals Review of self trigger point release using tennis ball  TherEX: Mid to lower back table stretch forward 5 x 5-second hold Scap retraction keeping RPE low at this time until cleared by vascular doctor 2 x 10 red  PATIENT EDUCATION:  Education details: HEP, symptom management, importance of contacting men MD if symptoms worsen Person educated: Patient Education method: Explanation, Demonstration, Tactile cues, Verbal cues, and Handouts Education comprehension: verbalized understanding, returned demonstration, verbal cues required, tactile cues required, and needs further education  HOME EXERCISE PROGRAM: Access Code: [email or web address] URL: [email or web address] Date: 01/09/2024 Prepared by: Alm Don  Exercises - Standing Glute Med Mobilization with Small Ball on Wall  - 1 x daily - 7 x weekly - 3 sets - 1 min  hold - Seated Bilateral Shoulder Flexion Towel Slide at Table Top  - 1 x daily - 7 x weekly - 3 sets - 5 reps - 10 sec hold  hold - Standing Bilateral Low Shoulder Row with Anchored Resistance  - 1 x daily - 7 x weekly - 3 sets - 10 reps  ASSESSMENT:  CLINICAL IMPRESSION:  Pt cleared to continue with PT from vascular specialist and  will have neuro consult placed to rule out neurological etiology. Pt with significant improvement in upper back pain with manual based therapy today. Pt does report improvement in pain with thoracic extension type movements as well. Plan to continue with postural exercise and thoracic extension as tolerated.   Eval:  Patient is a 68 year old female presents to physical therapy with mid thoracic pain.  She also presents with cognitive symptoms.  She also reports she has had progressive frequency of difficulty ambulating.  She reports her legs stop working for a period of time.  She is generally able to start walking again.  She will see a vascular specialist next week.  She has a history of aortic bypass.  She was shown exercises to reduce pain in mid thoracic spine.  She was strongly advised of any of these activities increased pain to stop.  She does have spasming in her mid thoracic paraspinals.  She has mild pain with endrange rotation.  She has a history of low back pain but has no significant pain at this time.  We will contact her NP about above symptoms.  She is advised if the symptoms worsen to seek further medical treatment sooner.  She was given a postural exercise but advised not to perform that increases pain.  She reported a decrease in pain with postural exercise.  We will proceed pending vascular findings and progression of cognitive symptoms.  OBJECTIVE IMPAIRMENTS: Abnormal gait, decreased activity tolerance, decreased cognition, decreased endurance, difficulty walking, decreased ROM, decreased strength, and pain.   ACTIVITY LIMITATIONS: carrying, lifting, standing, squatting, stairs, transfers, reach over head, and locomotion level  PARTICIPATION LIMITATIONS: meal prep, cleaning, laundry, driving, shopping, community activity, and yard work  PERSONAL FACTORS: 3+ comorbidity bilateral knee replacements: Aortic bypass: History of chronic low back pain  are also affecting patient's  functional outcome.   REHAB POTENTIAL: Good  CLINICAL DECISION MAKING: Unstable/unpredictable fluctuating instances of inability to use her legs.  Intermittent decreases in cognitive function  EVALUATION COMPLEXITY: High   GOALS: Goals reviewed with patient? Yes  SHORT TERM GOALS: Target date: 02/06/2024    Patient will increase bilateral thoracic rotation to end range without pain Baseline: Goal status: INITIAL  2.  Patient will report a 50% reduction in pain in thoracic area Baseline:  Goal status: INITIAL  3.  Patient will be independent with basic HEP when cleared by vascular doctor Baseline:  Goal status: INITIAL  4.  Patient will increase gross bilateral lower extremity strength by Baseline:  Goal status: INITIAL   LONG TERM GOALS: Target  date: 03/05/2024    Patient will return to full gym program Baseline:  Goal status: INITIAL  2.  Patient will perform daily tasks without increase of thoracic spine pain Baseline:  Goal status: INITIAL  3.  Patient will demonstrate equal left and right lower extremity strength in order to perform daily tasks Baseline:  Goal status: INITIAL PLAN:  PT FREQUENCY: 2x/week  PT DURATION: 8 weeks  PLANNED INTERVENTIONS: 97110-Therapeutic exercises, 97530- Therapeutic activity, 97112- Neuromuscular re-education, 97535- Self Care, [id number]- Manual therapy, [phone]- Gait training, [phone]- Aquatic Therapy, ),Patient/Family education, Stair training, Taping, Dry Needling, DME instructions, Cryotherapy, and Moist heat  PLAN FOR NEXT SESSION: Continue to work on soft tissue mobilization of the thoracic spine and general mobility of thoracic spine.  Once cleared by vascular doctor we can begin lower extremity strengthening and postural strengthening.  Patient is also recently seen cardiologist.  NP made aware of decline in cognitive status.   Dale Call, PT 01/15/2024, 12:45 PM

## 2024-01-20 ENCOUNTER — Encounter (HOSPITAL_BASED_OUTPATIENT_CLINIC_OR_DEPARTMENT_OTHER): Payer: Self-pay | Admitting: Physical Therapy

## 2024-01-20 ENCOUNTER — Ambulatory Visit (HOSPITAL_COMMUNITY)
Admission: RE | Admit: 2024-01-20 | Discharge: 2024-01-20 | Disposition: A | Discharge status: Home / Self Care | Source: Ambulatory Visit | Attending: Nurse Practitioner | Admitting: Nurse Practitioner

## 2024-01-20 DIAGNOSIS — R5383 Other fatigue: Secondary | ICD-10-CM | POA: Insufficient documentation

## 2024-01-20 DIAGNOSIS — I10 Essential (primary) hypertension: Secondary | ICD-10-CM | POA: Insufficient documentation

## 2024-01-20 DIAGNOSIS — E7800 Pure hypercholesterolemia, unspecified: Secondary | ICD-10-CM | POA: Diagnosis present

## 2024-01-20 DIAGNOSIS — I7409 Other arterial embolism and thrombosis of abdominal aorta: Secondary | ICD-10-CM | POA: Insufficient documentation

## 2024-01-20 DIAGNOSIS — I2510 Atherosclerotic heart disease of native coronary artery without angina pectoris: Secondary | ICD-10-CM | POA: Diagnosis present

## 2024-01-20 LAB — MYOCARDIAL PERFUSION IMAGING
Angina Index: 0
Duke Treadmill Score: 8
Estimated workload: 8
Exercise duration (min): 7 min
Exercise duration (sec): 30 s
LV dias vol: 52 mL (ref 46–106)
LV sys vol: 5 mL (ref 3.8–5.2)
MPHR: 150 {beats}/min
Nuc Stress EF: 90 %
Peak HR: 141 {beats}/min
Percent HR: 94 %
Rest HR: 63 {beats}/min
Rest Nuclear Isotope Dose: 10.8 mCi
SDS: 1
SRS: 0
SSS: 1
ST Depression (mm): 0 mm
Stress Nuclear Isotope Dose: 32.9 mCi
TID: 1.05

## 2024-01-20 MED ORDER — TECHNETIUM TC 99M TETROFOSMIN IV KIT
32.9000 | PACK | Freq: Once | INTRAVENOUS | Status: AC | PRN
  Administered 2024-01-20: 32.9 via INTRAVENOUS

## 2024-01-20 MED ORDER — TECHNETIUM TC 99M TETROFOSMIN IV KIT
10.8000 | PACK | Freq: Once | INTRAVENOUS | Status: AC | PRN
  Administered 2024-01-20: 10.8 via INTRAVENOUS

## 2024-01-21 ENCOUNTER — Ambulatory Visit (HOSPITAL_BASED_OUTPATIENT_CLINIC_OR_DEPARTMENT_OTHER): Admitting: Physical Therapy

## 2024-01-21 DIAGNOSIS — M6281 Muscle weakness (generalized): Secondary | ICD-10-CM

## 2024-01-21 DIAGNOSIS — M546 Pain in thoracic spine: Secondary | ICD-10-CM

## 2024-01-21 DIAGNOSIS — M5459 Other low back pain: Secondary | ICD-10-CM

## 2024-01-21 DIAGNOSIS — R2689 Other abnormalities of gait and mobility: Secondary | ICD-10-CM

## 2024-01-21 NOTE — Therapy (Signed)
 OUTPATIENT PHYSICAL THERAPY THORACOLUMBAR EVALUATION   Patient Name: Diane Giles MRN: [id number] DOB:25-Feb-1956, 68 y.o., female Today's Date: 01/22/2024  END OF SESSION:  PT End of Session - 01/22/24 0807     Visit Number 3    Number of Visits 16    Date for PT Re-Evaluation 03/05/24    Authorization Type MCR    PT Start Time 1600    PT Stop Time 1643    PT Time Calculation (min) 43 min    Activity Tolerance Patient tolerated treatment well    Behavior During Therapy [facility] for tasks assessed/performed            Past Medical History:  Diagnosis Date   Aortic stenosis    mild-moderate AS 04/22/19 echo   Aortoiliac occlusive disease (HCC) 12/03/2016   Arthritis    Bowel obstruction (HCC)    Diabetes mellitus without complication (HCC)    pt states she has been told she is no longer diabetic and is not taking meds   Diverticulitis    Gout    Heart murmur    Hyperlipidemia    Hypertension    Osteoporosis    Sleep apnea    Tobacco use disorder 07/04/2012   Vitamin B12 deficiency    Past Surgical History:  Procedure Laterality Date   ABDOMINAL AORTOGRAM W/LOWER EXTREMITY N/A 12/03/2016   Procedure: Abdominal Aortogram w/Lower Extremity;  Surgeon: Eliza Lonni RAMAN, MD;  Location: [facility] INVASIVE CV LAB;  Service: Cardiovascular;  Laterality: N/A;   AORTA -INNOMIATE BYPASS N/A 05/18/2019   Procedure: AORTA -INNOMIATE BYPASS Using Hemashield Gold Graft Size 8mm;  Surgeon: Lucas Dorise POUR, MD;  Location: [facility] OR;  Service: Open Heart Surgery;  Laterality: N/A;   aorto- fem bypass Bilateral    aortobiiliac bypass in 2000   DILATATION & CURETTAGE/HYSTEROSCOPY WITH MYOSURE N/A 12/27/2015   Procedure: DILATATION & CURETTAGE/HYSTEROSCOPY;  Surgeon: Bobie FORBES Cathlyn JAYSON Nikki, MD;  Location: [facility] ORS;  Service: Gynecology;  Laterality: N/A;   DILATION AND CURETTAGE OF UTERUS  11/2015   ELBOW SURGERY Left ~2007   tendon repair by Dr. Kay   EYE SURGERY Bilateral 2008    lasik   JOINT REPLACEMENT     lower aortic bypass  2000   per patient   PERIPHERAL VASCULAR INTERVENTION  12/03/2016   Stenting of anastomotic stenosis of the aortoiliac and right common iliac artery with 7 x 39 mm VBX covered stent and postdilated with 9 mm balloon.   Right innonimate bypass  05/18/2019   Aorto innominate bypass surgery with median sternotomy   STERNOTOMY  05/18/2019   Right aorto-innonimate bypass surgery 05/18/19 Dr. Lucas   TOTAL HIP ARTHROPLASTY Right 07/27/2014   Procedure: RIGHT TOTAL HIP ARTHROPLASTY ANTERIOR APPROACH;  Surgeon: Donnice Car, MD;  Location: [facility] ORS;  Service: Orthopedics;  Laterality: Right;   TOTAL KNEE ARTHROPLASTY Right 05/28/2022   Procedure: RIGHT TOTAL KNEE ARTHROPLASTY;  Surgeon: Jerri Kay HERO, MD;  Location: [facility] OR;  Service: Orthopedics;  Laterality: Right;   TOTAL KNEE ARTHROPLASTY Left 03/04/2023   Procedure: TOTAL KNEE ARTHROPLASTY;  Surgeon: Jerri Kay HERO, MD;  Location: [facility] OR;  Service: Orthopedics;  Laterality: Left;   Patient Active Problem List   Diagnosis Date Noted   Gout 10/22/2023   Anxiety 10/22/2023   Neuropathy 10/22/2023   SBO (small bowel obstruction) (HCC) 07/13/2023   Status post total left knee replacement 03/04/2023   Primary osteoarthritis of left knee 07/10/2022   Status post total right knee  replacement 05/28/2022   Vitamin B12 deficiency 10/31/2021   Small bowel obstruction (HCC)    Partial small bowel obstruction (HCC) 10/28/2021   PAD (peripheral artery disease) (HCC)    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   Aortoiliac occlusive disease (HCC) 12/03/2016   Centrilobular emphysema (HCC) 10/03/2016   Right groin pain 02/27/2016   Sacroiliitis (HCC) 02/27/2016   Postmenopausal bleeding 12/01/2015   Psoas tendinitis of right side 10/21/2015   Endometrial thickening on ultrasound 07/28/2015   S/P right THA, 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   Type 2 diabetes mellitus (HCC) 05/18/2014   DDD (degenerative disc  disease) 03/18/2013   Metabolic syndrome 07/29/2012   Essential hypertension, benign 07/18/2012   Hyperlipidemia 07/18/2012   Obstructive sleep apnea 07/04/2012   Osteoporosis 07/04/2012   Morbid obesity (HCC) 07/04/2012    PCP: Philippe Slade NP   REFERRING PROVIDER: Duwaine Pouch NP  REFERRING DIAG:   Rationale for Evaluation and Treatment: Rehabilitation  THERAPY DIAG:  Pain in thoracic spine  Other low back pain  Muscle weakness (generalized)  Other abnormalities of gait and mobility  ONSET DATE:   SUBJECTIVE:                                                                                                                                                                                           SUBJECTIVE STATEMENT:  The patient reports she has had very little pain. Her mental state is much better. She had a medication adjustment she thinks helped. She has been back to the gym,   Eval:  Patient is a 68 year old female who presents to physical therapy with complaints of midthoracic pain.  She also reports she is having increasing frequency and inability to move her legs.  She reports these episodes only last a few minutes.  She is generally walking and feels like she cannot take another step.  When she stands still for 1 to 2 minutes she feels like she has improved function in her legs and can walk.  She reports this started off happening once or twice a year then moved to more monthly and now she feels like is happening daily.  She also presents today with decreased cognition.  She reports she feels like she has been in a fog recently.  Therapy will contact NP and make her aware of the symptoms.  She is advised if the symptoms become severe to seek further medical treatment as soon as possible.  She reports her midthoracic pain started over the past 3 to 4 months.  She has increased pain with activity.  Of note she also has a follow-up with a vascular doctor.  She has a  yearly follow-up with her vascular doctor.  She has a history of aortic stenting with a right aortic bypass.  PERTINENT HISTORY:  Left hip replacement, right and left knee replacement, DMII, Sternotomy  PAIN:  Are you having pain? No: NPRS scale: 0/10 Pain location: mid thoracic spine  Pain description: aching  Aggravating factors: activity  Relieving factors: comes and goes  PRECAUTIONS: None  RED FLAGS: Changes in mental status.    WEIGHT BEARING RESTRICTIONS: No  FALLS:  Has patient fallen in last 6 months? No but has had several instances where she can't move her legs.   LIVING ENVIRONMENT: Nothing pertinent  OCCUPATION:  Retired   PLOF: Independent  PATIENT GOALS: To return to exercise program  NEXT MD VISIT: Sees vascular doctor next week sees primary care on 7/19  OBJECTIVE:  Note: Objective measures were completed at Evaluation unless otherwise noted.  DIAGNOSTIC FINDINGS:  MRI thoracic spine:  IMPRESSION: 1. Mild thoracic spine spondylosis as described above. 2. No acute osseous injury of the thoracic spine.   Lumbar MRI:  IMPRESSION: 1. Mild lumbar spondylosis without spinal canal stenosis. 2. Central disc protrusion at L5-S1 displaces the traversing left S1 nerve root in the subarticular zone.   PATIENT SURVEYS:  Modified Oswestry 24/50   B/P 128/88 baseline   COGNITION: Overall cognitive status: different from baseline. The patient reports she feels like she is in a fog. This happens several times a week    SENSATION: Feels like legs stop working  MUSCLE LENGTH:  POSTURE: No Significant postural limitations  PALPATION: Tenderness to palpation of bilateral thoracic paraspinals  Thoracic ROM:   AROM eval  Flexion   Extension   Right lateral flexion   Left lateral flexion   Right rotation   Left rotation    (Blank rows = not tested)   LOWER EXTREMITY MMT:    MMT Right eval Left eval  Hip flexion 16.9 27.2  Hip extension     Hip abduction 16.7 16.6  Hip adduction    Hip internal rotation    Hip external rotation    Knee flexion    Knee extension 18.5 24.8  Ankle dorsiflexion    Ankle plantarflexion    Ankle inversion    Ankle eversion     (Blank rows = not tested)    GAIT: Decreased bilateral hip flexion increased lateral movement TREATMENT DATE:  9/16 Manual: STM paraspinals of R mid-spine, peri-scapulars Corss hand gross throcic mobilization   There-ex:  Anterior stretch with wand 2 lbs  3x10   Neuro re-ed:  Punches 2 lbs with cuing for posture and abdominal bracing 2x10  Biceps curl with cuing for posture and abdominal bracing 2x10  Cable row 15 lbs tried 10 but had a low RPE 3x10  Shoulder extension 3x10 15 lbs    9/10  STM paraspinals of R mid-spine, peri-scapulars R UPA grade III T3-6   Postural changes during driving trips  Seated thoracic extension 10x  Seated pelvic tilts 10x   Previous:  Manual: Trigger point release to bilateral paraspinals Review of self trigger point release using tennis ball  TherEX: Mid to lower back table stretch forward 5 x 5-second hold Scap retraction keeping RPE low at this time until cleared by vascular doctor 2 x 10 red  PATIENT EDUCATION:  Education details: HEP, symptom management, importance of contacting men MD if symptoms worsen Person educated: Patient Education method: Explanation, Demonstration, Tactile cues, Verbal cues, and Handouts Education comprehension: verbalized understanding, returned demonstration, verbal cues required, tactile cues required, and needs further education  HOME EXERCISE PROGRAM: Access Code: [email or web address] URL: [email or web address] Date: 01/09/2024 Prepared by: Alm Don  Exercises - Standing Glute Med Mobilization with Small Ball on Wall  - 1 x daily - 7 x  weekly - 3 sets - 1 min  hold - Seated Bilateral Shoulder Flexion Towel Slide at Table Top  - 1 x daily - 7 x weekly - 3 sets - 5 reps - 10 sec hold  hold - Standing Bilateral Low Shoulder Row with Anchored Resistance  - 1 x daily - 7 x weekly - 3 sets - 10 reps  ASSESSMENT:  CLINICAL IMPRESSION:  The patient tolerated treatment well. She had no pain  with any exercises. We tested her thoracic spine to see how it reacts. She was advised if it is painful to let us  know and we will get her in. If it feels good she may ontinue with an indepdnenet gym program    Eval:  Patient is a 68 year old female presents to physical therapy with mid thoracic pain.  She also presents with cognitive symptoms.  She also reports she has had progressive frequency of difficulty ambulating.  She reports her legs stop working for a period of time.  She is generally able to start walking again.  She will see a vascular specialist next week.  She has a history of aortic bypass.  She was shown exercises to reduce pain in mid thoracic spine.  She was strongly advised of any of these activities increased pain to stop.  She does have spasming in her mid thoracic paraspinals.  She has mild pain with endrange rotation.  She has a history of low back pain but has no significant pain at this time.  We will contact her NP about above symptoms.  She is advised if the symptoms worsen to seek further medical treatment sooner.  She was given a postural exercise but advised not to perform that increases pain.  She reported a decrease in pain with postural exercise.  We will proceed pending vascular findings and progression of cognitive symptoms.  OBJECTIVE IMPAIRMENTS: Abnormal gait, decreased activity tolerance, decreased cognition, decreased endurance, difficulty walking, decreased ROM, decreased strength, and pain.   ACTIVITY LIMITATIONS: carrying, lifting, standing, squatting, stairs, transfers, reach over head, and locomotion  level  PARTICIPATION LIMITATIONS: meal prep, cleaning, laundry, driving, shopping, community activity, and yard work  PERSONAL FACTORS: 3+ comorbidity bilateral knee replacements: Aortic bypass: History of chronic low back pain  are also affecting patient's functional outcome.   REHAB POTENTIAL: Good  CLINICAL DECISION MAKING: Unstable/unpredictable fluctuating instances of inability to use her legs.  Intermittent decreases in cognitive function  EVALUATION COMPLEXITY: High   GOALS: Goals reviewed with patient? Yes  SHORT TERM GOALS: Target date: 02/06/2024    Patient will increase bilateral thoracic rotation to end range without pain Baseline: Goal status: INITIAL  2.  Patient will report a 50% reduction in pain in thoracic area Baseline:  Goal status: INITIAL  3.  Patient will be independent with basic HEP when cleared by vascular doctor Baseline:  Goal status: INITIAL  4.  Patient will increase gross bilateral lower extremity strength by Baseline:  Goal status: INITIAL   LONG TERM GOALS: Target date: 03/05/2024  Patient will return to full gym program Baseline:  Goal status: INITIAL  2.  Patient will perform daily tasks without increase of thoracic spine pain Baseline:  Goal status: INITIAL  3.  Patient will demonstrate equal left and right lower extremity strength in order to perform daily tasks Baseline:  Goal status: INITIAL PLAN:  PT FREQUENCY: 2x/week  PT DURATION: 8 weeks  PLANNED INTERVENTIONS: 97110-Therapeutic exercises, 97530- Therapeutic activity, 97112- Neuromuscular re-education, 97535- Self Care, 02859- Manual therapy, [phone]- Gait training, [phone]- Aquatic Therapy, ),Patient/Family education, Stair training, Taping, Dry Needling, DME instructions, Cryotherapy, and Moist heat  PLAN FOR NEXT SESSION: Continue to work on soft tissue mobilization of the thoracic spine and general mobility of thoracic spine.  Once cleared by vascular doctor we  can begin lower extremity strengthening and postural strengthening.  Patient is also recently seen cardiologist.  NP made aware of decline in cognitive status.   Alm JINNY Don, PT 01/22/2024, 8:10 AM

## 2024-01-22 ENCOUNTER — Encounter (HOSPITAL_BASED_OUTPATIENT_CLINIC_OR_DEPARTMENT_OTHER): Payer: Self-pay | Admitting: Physical Therapy

## 2024-01-23 ENCOUNTER — Ambulatory Visit: Admitting: Family Medicine

## 2024-01-23 ENCOUNTER — Ambulatory Visit: Payer: Self-pay | Admitting: Nurse Practitioner

## 2024-01-24 ENCOUNTER — Encounter: Payer: Self-pay | Admitting: Family Medicine

## 2024-01-24 ENCOUNTER — Ambulatory Visit (INDEPENDENT_AMBULATORY_CARE_PROVIDER_SITE_OTHER): Admitting: Family Medicine

## 2024-01-24 VITALS — BP 136/74 | HR 67 | Temp 97.6°F | Ht 58.5 in | Wt 140.0 lb

## 2024-01-24 DIAGNOSIS — M109 Gout, unspecified: Secondary | ICD-10-CM | POA: Diagnosis not present

## 2024-01-24 DIAGNOSIS — I10 Essential (primary) hypertension: Secondary | ICD-10-CM

## 2024-01-24 DIAGNOSIS — Z7984 Long term (current) use of oral hypoglycemic drugs: Secondary | ICD-10-CM

## 2024-01-24 DIAGNOSIS — Z23 Encounter for immunization: Secondary | ICD-10-CM

## 2024-01-24 DIAGNOSIS — G629 Polyneuropathy, unspecified: Secondary | ICD-10-CM

## 2024-01-24 DIAGNOSIS — E782 Mixed hyperlipidemia: Secondary | ICD-10-CM

## 2024-01-24 DIAGNOSIS — E1165 Type 2 diabetes mellitus with hyperglycemia: Secondary | ICD-10-CM

## 2024-01-24 DIAGNOSIS — Z7985 Long-term (current) use of injectable non-insulin antidiabetic drugs: Secondary | ICD-10-CM

## 2024-01-24 DIAGNOSIS — F419 Anxiety disorder, unspecified: Secondary | ICD-10-CM

## 2024-01-24 MED ORDER — PREGABALIN 50 MG PO CAPS: 50.0000 mg | ORAL_CAPSULE | Freq: Two times a day (BID) | ORAL | 0 refills | Status: DC

## 2024-01-24 NOTE — Assessment & Plan Note (Signed)
 Continue taking Atorvastatin  80mg  daily and Ezetimibe  10mg  daily. Ordered lipid panel and CMP. Fasting. Will send lab results to cardiology.

## 2024-01-24 NOTE — Assessment & Plan Note (Signed)
 Continue taking Carvedilol  6.25 BID and Losartan  50mg  daily. Ordered CMP to assess kidney function. Concerned with patients range in blood pressure, especially in the evening. Will reach out to E. Wyn, NP with [facility] once lab results are back about her blood pressure and consult.

## 2024-01-24 NOTE — Assessment & Plan Note (Addendum)
-  Discontinue Wellbutrin  when she gets back from her trip. First week, take medication every other day. Second week, take medication once every 3 days. Continue to monitor emotional and mental health. Please send message through MyChart if starts to change in a negative way. Scored 1 on PHQ-9 and GAD-7.

## 2024-01-24 NOTE — Assessment & Plan Note (Signed)
 Stable. Continue Ozempic 0.5mg  injection weekly and Farxiga  10mg  daily.  Ordered A1c, microalbumin/creatinine urine ratio, and CMP. Foot exam completed. Patient is on a statin and takes a ARB. Will request records from ophthalmology.

## 2024-01-24 NOTE — Assessment & Plan Note (Signed)
 Stable. Continue taking Allopurinol  100mg  daily.

## 2024-01-24 NOTE — Progress Notes (Signed)
 Established Patient Office Visit   Subjective:  Patient ID: Diane Giles, female    DOB: 07-20-1955  Age: 68 y.o. MRN: [id number]  Chief Complaint  Patient presents with   Medical Management of Chronic Issues    3 month follow up     HPI Gout: Chronic. Patient is prescribed Allopurinol  100mg  daily. Not had a gout attack in years.    HTN: Chronic. Patient is prescribed Carvedilol  6.25 BID and Losartan  50mg  daily. Cardiology changed patients Atenolol  to Carvedilol  because of elevated BPs in the evening. She has been monitoring her blood pressures at home. Ranging 109-164/50-74, with one BP low, 79/62 and one elevated 163/130, not sure if these are correct. Blood pressure is still elevated more in the evenings. Denies CP, SHOB, and HA. Still having dizziness and lightheadedness, but not as severe or frequent. Daily lower extremity edema.  BP Readings from Last 3 Encounters:  01/24/24 136/74  01/10/24 (!) 141/77  01/09/24 (!) 144/84    Hyperlipidemia: Chronic. Patient is prescribed Atorvastatin  80mg  daily and Ezetimibe  10mg  daily.  Denies any muscle or joint pain.  Lab Results  Component Value Date   CHOL 129 10/22/2023   HDL 50.90 10/22/2023   LDLCALC 56 10/22/2023   TRIG 113.0 10/22/2023   CHOLHDL 3 10/22/2023    Anxiety: Patient is prescribed Bupropion  150mg  daily. Effective. Patient would like to come off medication to see how she does. She does think it was also helping with weight loss.    Diabetes: Chronic. Patient is prescribed Ozempic 0.5mg  injection weekly and taking Farxiga  10mg  daily. Had an elevated microalbumin. She reports she previously been on Metformin , but with weight loss she was able to stop medication. She has come off Ozempic before, gained about 13Ibs, and went back on medication. She is interested in increasing medication, but receives the medication directly from company.    Neuropathy: Chronic. Patient is taking Pregabalin  75mg  BID. Effective, but would  like to decrease medication for her personal choice.  ROS See HPI above     Objective:   BP 136/74   Pulse 67   Temp 97.6 F (36.4 C) (Oral)   Ht 4' 10.5 (1.486 m)   Wt 140 lb (63.5 kg)   LMP 05/07/2005 (Approximate)   SpO2 95%   BMI 28.76 kg/m    Physical Exam Vitals reviewed.  Constitutional:      General: She is not in acute distress.    Appearance: Normal appearance. She is not ill-appearing, toxic-appearing or diaphoretic.  HENT:     Head: Normocephalic and atraumatic.  Eyes:     General:        Right eye: No discharge.        Left eye: No discharge.     Conjunctiva/sclera: Conjunctivae normal.  Cardiovascular:     Rate and Rhythm: Normal rate and regular rhythm.     Pulses:          Dorsalis pedis pulses are 2+ on the right side and 2+ on the left side.     Heart sounds: Murmur heard.     No friction rub. No gallop.  Pulmonary:     Effort: Pulmonary effort is normal. No respiratory distress.     Breath sounds: Normal breath sounds.  Musculoskeletal:        General: Normal range of motion.     Right lower leg: Edema (+1) present.     Left lower leg: Edema (+1) present.  Feet:  Right foot:     Protective Sensation: 10 sites tested.  10 sites sensed.     Skin integrity: Skin integrity normal.     Toenail Condition: Right toenails are long.     Left foot:     Protective Sensation: 10 sites tested.  10 sites sensed.     Skin integrity: Skin integrity normal.     Toenail Condition: Left toenails are long.  Skin:    General: Skin is warm and dry.  Neurological:     General: No focal deficit present.     Mental Status: She is alert and oriented to person, place, and time. Mental status is at baseline.  Psychiatric:        Mood and Affect: Mood normal.        Behavior: Behavior normal.        Thought Content: Thought content normal.        Judgment: Judgment normal.      Assessment & Plan:  Gout, unspecified cause, unspecified chronicity, unspecified  site Assessment & Plan: Stable. Continue taking Allopurinol  100mg  daily.      Essential hypertension, benign Assessment & Plan: Continue taking Carvedilol  6.25 BID and Losartan  50mg  daily. Ordered CMP to assess kidney function. Concerned with patients range in blood pressure, especially in the evening. Will reach out to E. Wyn, NP with [facility] once lab results are back about her blood pressure and consult.   Orders: -     Comprehensive metabolic panel with GFR  Mixed hyperlipidemia Assessment & Plan: Continue taking Atorvastatin  80mg  daily and Ezetimibe  10mg  daily. Ordered lipid panel and CMP. Fasting. Will send lab results to cardiology.   Orders: -     Comprehensive metabolic panel with GFR -     Lipid panel  Type 2 diabetes mellitus with hyperglycemia, without long-term current use of insulin  (HCC) Assessment & Plan: Stable. Continue Ozempic 0.5mg  injection weekly and Farxiga  10mg  daily.  Ordered A1c, microalbumin/creatinine urine ratio, and CMP. Foot exam completed. Patient is on a statin and takes a ARB. Will request records from ophthalmology.   Orders: -     Comprehensive metabolic panel with GFR -     Hemoglobin A1c -     Microalbumin / creatinine urine ratio  Neuropathy Assessment & Plan: Stable. Patient would like to decrease this medication as a personal choice. She was taking Pregabalin  75mg  BID. Will decrease to 50mg  BID. UDS ordered and will need to be completed periodically. Signed controlled substance contract UTD. PDMP reviewed. Last refilled 12/16/2023.    Orders: -     Pregabalin ; Take 1 capsule (50 mg total) by mouth 2 (two) times daily.  Dispense: 60 capsule; Refill: 0 -     DRUG MONITOR, PANEL 1, SCREEN, URINE  Anxiety Assessment & Plan: -Discontinue Wellbutrin  when she gets back from her trip. First week, take medication every other day. Second week, take medication once every 3 days. Continue to monitor emotional and mental health. Please send  message through MyChart if starts to change in a negative way. Scored 1 on PHQ-9 and GAD-7.    Immunization due -     Flu vaccine HIGH DOSE PF(Fluzone Trivalent)  1.Review health maintenance:  -Colonoscopy: [facility] 2024. -Covid booster: Decline -Influenza vaccine: Administered -Mammogram: Declines  -AWV: Declines  -Ophthalmology: Dr. [facility] (cataract surgery in 10/15)  Return in about 3 months (around 04/24/2024) for chronic management.   Jarome Trull, NP

## 2024-01-24 NOTE — Patient Instructions (Addendum)
-  It was great to see you. I hope you have a great vacation! -Influenza vaccine administered. -Discontinue Wellbutrin  when you get back from your trip. First week, take medication every other day. Second week, take medication once every 3 days. Continue to monitor you emotional and mental health. Please send message through MyChart if starts to change in a negative way.  -Decrease Pregablin to 50mg  tablet twice a day from 75mg  tablet.  -Continue all medications.  -I will reach out to cardiology once I receive your lab results to provide results and inquire about elevated blood pressure in the evening.  -Ordered labs and urine. Office will call with results and will be available on MyChart. -Follow up in 3 months.

## 2024-01-24 NOTE — Assessment & Plan Note (Signed)
 Stable. Patient would like to decrease this medication as a personal choice. She was taking Pregabalin  75mg  BID. Will decrease to 50mg  BID. UDS ordered and will need to be completed periodically. Signed controlled substance contract UTD. PDMP reviewed. Last refilled 12/16/2023.

## 2024-01-25 ENCOUNTER — Other Ambulatory Visit: Payer: Self-pay | Admitting: Family Medicine

## 2024-01-25 DIAGNOSIS — G629 Polyneuropathy, unspecified: Secondary | ICD-10-CM

## 2024-01-25 LAB — COMPREHENSIVE METABOLIC PANEL WITH GFR
AG Ratio: 1.8 (calc) (ref 1.0–2.5)
ALT: 12 U/L (ref 6–29)
AST: 17 U/L (ref 10–35)
Albumin: 4.2 g/dL (ref 3.6–5.1)
Alkaline phosphatase (APISO): 139 U/L (ref 37–153)
BUN/Creatinine Ratio: 15 (calc) (ref 6–22)
BUN: 19 mg/dL (ref 7–25)
CO2: 29 mmol/L (ref 20–32)
Calcium: 10.2 mg/dL (ref 8.6–10.4)
Chloride: 102 mmol/L (ref 98–110)
Creat: 1.27 mg/dL — ABNORMAL HIGH (ref 0.60–1.00)
Globulin: 2.3 g/dL (ref 1.9–3.7)
Glucose, Bld: 80 mg/dL (ref 65–99)
Potassium: 3.9 mmol/L (ref 3.5–5.3)
Sodium: 139 mmol/L (ref 135–146)
Total Bilirubin: 0.6 mg/dL (ref 0.2–1.2)
Total Protein: 6.5 g/dL (ref 6.1–8.1)
eGFR: 45 mL/min/1.73m2 — ABNORMAL LOW (ref >=60)

## 2024-01-25 LAB — MICROALBUMIN / CREATININE URINE RATIO
Creatinine, Urine: 28 mg/dL (ref 20–275)
Microalb Creat Ratio: 21 mg/g{creat} (ref <30)
Microalb, Ur: 0.6 mg/dL

## 2024-01-25 LAB — DRUG MONITOR, PANEL 1, SCREEN, URINE
Amphetamines: NEGATIVE ng/mL (ref <500)
Barbiturates: NEGATIVE ng/mL (ref <300)
Benzodiazepines: NEGATIVE ng/mL (ref <100)
Cocaine Metabolite: NEGATIVE ng/mL (ref <150)
Creatinine: 26.2 mg/dL (ref >=20.0)
Marijuana Metabolite: NEGATIVE ng/mL (ref <20)
Methadone Metabolite: NEGATIVE ng/mL (ref <100)
Opiates: NEGATIVE ng/mL (ref <100)
Oxidant: NEGATIVE ug/mL (ref <200)
Oxycodone: NEGATIVE ng/mL (ref <100)
Phencyclidine: NEGATIVE ng/mL (ref <25)
pH: 7.1 (ref 4.5–9.0)

## 2024-01-25 LAB — HEMOGLOBIN A1C
Hgb A1c MFr Bld: 5.3 % (ref <5.7)
Mean Plasma Glucose: 105 mg/dL
eAG (mmol/L): 5.8 mmol/L

## 2024-01-25 LAB — LIPID PANEL
Cholesterol: 127 mg/dL (ref <200)
HDL: 52 mg/dL (ref >=50)
LDL Cholesterol (Calc): 56 mg/dL
Non-HDL Cholesterol (Calc): 75 mg/dL (ref <130)
Total CHOL/HDL Ratio: 2.4 (calc) (ref <5.0)
Triglycerides: 109 mg/dL (ref <150)

## 2024-01-25 LAB — DM TEMPLATE

## 2024-01-26 ENCOUNTER — Other Ambulatory Visit: Payer: Self-pay | Admitting: Family Medicine

## 2024-01-27 ENCOUNTER — Ambulatory Visit: Payer: Self-pay | Admitting: Family Medicine

## 2024-01-27 ENCOUNTER — Other Ambulatory Visit: Payer: Self-pay | Admitting: Family Medicine

## 2024-01-27 DIAGNOSIS — G629 Polyneuropathy, unspecified: Secondary | ICD-10-CM

## 2024-01-28 ENCOUNTER — Telehealth: Payer: Self-pay

## 2024-01-28 MED ORDER — CARVEDILOL 12.5 MG PO TABS: 12.5000 mg | ORAL_TABLET | Freq: Two times a day (BID) | ORAL | 1 refills | Status: DC

## 2024-01-28 NOTE — Telephone Encounter (Signed)
-----   Message from Wyn Raddle, Jackee Shove sent at 01/27/2024  5:33 PM EDT ----- Thanks for reaching out and providing an update.  We will adjust her carvedilol  to 12.5 mg twice daily and have her follow-up with the hypertension clinic for further titration and to evaluate BP after change.  Please have patient bring her blood pressure cuff to her appointment with the pharmacist for review.  She can also discuss cholesterol medication with them at that time.  Jackee Wyn, NP ----- Message ----- From: Billy Philippe SAUNDERS, NP Sent: 01/27/2024  10:48 AM EDT To: Jackee VEAR Wyn Raddle., NP  Good morning,   This is a mutual patient we have together. I reached out to previously about her. I wanted to update you on her status from my most recent visit on Friday and provide lab results to you. Patients blood pressure is still running high most afternoons and stable in the AM even with medication changes from your last visit with her. Do you have any recommendations on changing BP medications or do I need to refer her to the hypertension clinic? Also, she has had lipids drawn. They are about the same as 3 months ago. She thought she was going to speak with pharmacy about medication changes with her cholesterol medication. However, I do see anything scheduled.   Thank you for all your help!  JoAnna, NP

## 2024-01-28 NOTE — Telephone Encounter (Signed)
 Patient voiced understanding.   Message sent to scheduler to get pharm-d appt scheduled.

## 2024-02-12 LAB — LAB REPORT - SCANNED
Albumin, Urine POC: 11.1
Albumin/Creatinine Ratio, Urine, POC: 47
EGFR: 48
PTH, Intact: 25

## 2024-02-18 ENCOUNTER — Encounter: Payer: Self-pay | Admitting: Family Medicine

## 2024-02-19 ENCOUNTER — Telehealth: Payer: Self-pay

## 2024-02-19 ENCOUNTER — Other Ambulatory Visit: Payer: Self-pay

## 2024-02-19 DIAGNOSIS — E1165 Type 2 diabetes mellitus with hyperglycemia: Secondary | ICD-10-CM

## 2024-02-19 MED ORDER — DAPAGLIFLOZIN PROPANEDIOL 10 MG PO TABS: 10.0000 mg | ORAL_TABLET | Freq: Every day | ORAL | 1 refills | Status: AC

## 2024-02-19 MED ORDER — DAPAGLIFLOZIN PROPANEDIOL 10 MG PO TABS: 10.0000 mg | ORAL_TABLET | Freq: Every day | ORAL | 1 refills | Status: DC

## 2024-02-19 NOTE — Addendum Note (Signed)
 Addended by: DIONISIO CAMELIA PARAS on: 02/19/2024 02:35 PM   Modules accepted: Orders

## 2024-02-19 NOTE — Telephone Encounter (Signed)
 Gave pt a call pt is coming up due for re-enrollment on [employer] Farxiga  pt has been pre-approved,spoke with pt will be calling [employer] to give consent to 2026 re-enrollment.was provide with [employer] number.

## 2024-02-19 NOTE — Addendum Note (Signed)
 Addended by: DIONISIO CAMELIA PARAS on: 02/19/2024 02:37 PM   Modules accepted: Orders

## 2024-02-19 NOTE — Progress Notes (Signed)
   02/19/2024  Patient ID: Diane Giles, female   DOB: Sep 13, 1955, 68 y.o.   MRN: [id number]  Received fax notification from [employer] that patient has been conditionally approved to receive Farxiga  for 2026. In order to get full approval for 2026 and continue to receive product, patient needs to provide consent that they acknowledge the terms and conditions of the program.   This can be down in the following ways: 1) Patient can go to [email or web address] to provide consent via digital assistant 2) Call [phone] to provide verbal consent 3) Complete the paper application   [employer] Patient ID: [id number]   Forwarding to CPhT to assist with enrollment process for patient.   Jon VEAR Lindau, PharmD Clinical Pharmacist [phone]

## 2024-02-24 ENCOUNTER — Telehealth: Payer: Self-pay

## 2024-02-24 ENCOUNTER — Other Ambulatory Visit: Payer: Self-pay

## 2024-02-24 MED ORDER — EZETIMIBE 10 MG PO TABS: 10.0000 mg | ORAL_TABLET | Freq: Every day | ORAL | 1 refills | Status: AC

## 2024-02-24 MED ORDER — ATORVASTATIN CALCIUM 80 MG PO TABS: 80.0000 mg | ORAL_TABLET | Freq: Every evening | ORAL | 1 refills | Status: AC

## 2024-02-24 MED ORDER — BUPROPION HCL ER (XL) 150 MG PO TB24: 150.0000 mg | ORAL_TABLET | Freq: Every day | ORAL | 1 refills | Status: AC

## 2024-02-24 MED ORDER — ALLOPURINOL 100 MG PO TABS: 100.0000 mg | ORAL_TABLET | Freq: Every day | ORAL | 1 refills | Status: AC

## 2024-02-24 NOTE — Progress Notes (Signed)
   02/24/2024  Patient ID: Diane Giles, female   DOB: [birth year], 68 y.o.   MRN: [id number]  Received fax notification from [employer] that patient has been approved to continue to receive Farxiga  10mg  through 05/06/25.  Jon VEAR Lindau, PharmD Clinical Pharmacist [phone]

## 2024-02-27 ENCOUNTER — Ambulatory Visit (HOSPITAL_COMMUNITY)
Admission: RE | Admit: 2024-02-27 | Discharge: 2024-02-27 | Disposition: A | Discharge status: Home / Self Care | Source: Ambulatory Visit | Attending: Internal Medicine | Admitting: Internal Medicine

## 2024-02-27 DIAGNOSIS — I708 Atherosclerosis of other arteries: Secondary | ICD-10-CM | POA: Diagnosis present

## 2024-02-27 DIAGNOSIS — I739 Peripheral vascular disease, unspecified: Secondary | ICD-10-CM | POA: Insufficient documentation

## 2024-02-27 DIAGNOSIS — I2510 Atherosclerotic heart disease of native coronary artery without angina pectoris: Secondary | ICD-10-CM | POA: Insufficient documentation

## 2024-02-27 LAB — ECHOCARDIOGRAM COMPLETE
AR max vel: 1.09 cm2
AV Area VTI: 1.12 cm2
AV Area mean vel: 1.02 cm2
AV Mean grad: 14 mmHg
AV Peak grad: 24 mmHg
Ao pk vel: 2.45 m/s
Area-P 1/2: 2.79 cm2
S' Lateral: 2.3 cm

## 2024-02-28 ENCOUNTER — Ambulatory Visit (HOSPITAL_COMMUNITY): Payer: Self-pay | Admitting: Nurse Practitioner

## 2024-02-28 ENCOUNTER — Other Ambulatory Visit (INDEPENDENT_AMBULATORY_CARE_PROVIDER_SITE_OTHER): Payer: Self-pay

## 2024-02-28 ENCOUNTER — Encounter (HOSPITAL_COMMUNITY): Payer: Self-pay

## 2024-02-28 ENCOUNTER — Ambulatory Visit (INDEPENDENT_AMBULATORY_CARE_PROVIDER_SITE_OTHER): Admitting: Physician Assistant

## 2024-02-28 DIAGNOSIS — Z96652 Presence of left artificial knee joint: Secondary | ICD-10-CM | POA: Diagnosis not present

## 2024-02-28 DIAGNOSIS — I2510 Atherosclerotic heart disease of native coronary artery without angina pectoris: Secondary | ICD-10-CM

## 2024-02-28 DIAGNOSIS — I708 Atherosclerosis of other arteries: Secondary | ICD-10-CM

## 2024-02-28 NOTE — Progress Notes (Signed)
 Post-Op Visit Note   Patient: Diane Giles           Date of Birth: 04-23-1956           MRN: [id number] Visit Date: 02/28/2024 PCP: Billy Philippe SAUNDERS, NP   Assessment & Plan:  Chief Complaint:  Chief Complaint  Patient presents with   Left Knee - Follow-up    Left TKA 03/04/2023   Visit Diagnoses:  1. Status post total left knee replacement     Plan: Patient is a very pleasant 68 year old female who comes in today 1 year status post left total knee replacement.  She is still having a fair amount of pain if she sits in any 1 position with her knees flexed for a period of time such as when she is on a car ride.  Otherwise, she is doing great.  Examination of the left knee reveals range of motion from 0 to 120 degrees.  Stable valgus varus stress.  She is neurovascularly intact distally.  This point, she will continue to advance with activity as tolerated.  Follow-up in a year for repeat evaluation and 2 view x-rays of the left knee.  Follow-Up Instructions: Return in about 1 year (around 02/27/2025).   Orders:  Orders Placed This Encounter  Procedures   XR Knee 1-2 Views Left   No orders of the defined types were placed in this encounter.   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   Aortoiliac occlusive disease (HCC) 12/03/2016   Centrilobular emphysema  (HCC) 10/03/2016   Right groin pain 02/27/2016   Sacroiliitis 02/27/2016   Postmenopausal bleeding 12/01/2015   Psoas tendinitis of right side 10/21/2015   Endometrial thickening on ultrasound 07/28/2015   S/P right THA, 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   Type 2 diabetes mellitus (HCC) 05/18/2014   DDD (degenerative disc disease) 03/18/2013   Metabolic syndrome 07/29/2012   Essential hypertension, benign 07/18/2012   Hyperlipidemia 07/18/2012   Obstructive sleep apnea 07/04/2012   Osteoporosis 07/04/2012   Morbid obesity (HCC) 07/04/2012   Past Medical History:  Diagnosis Date   Aortic stenosis    mild-moderate AS 04/22/19 echo   Aortoiliac occlusive disease (HCC) 12/03/2016   Arthritis    Bowel obstruction (HCC)    Diabetes mellitus without complication (HCC)    pt states she has been told she is no longer diabetic and is not taking meds   Diverticulitis    Gout    Heart murmur    Hyperlipidemia    Hypertension    Osteoporosis    Sleep apnea    Tobacco use disorder 07/04/2012   Vitamin B12 deficiency     Family History  Problem Relation Age of Onset   Migraines Mother    Thyroid  disease Mother        hypothyroid   Cancer Father 46       Dec age 88 with pancreatic Ca  Rheum arthritis Daughter    Migraines Daughter     Past Surgical History:  Procedure Laterality Date   ABDOMINAL AORTOGRAM W/LOWER EXTREMITY N/A 12/03/2016   Procedure: Abdominal Aortogram w/Lower Extremity;  Surgeon: Eliza Lonni RAMAN, MD;  Location: [facility] INVASIVE CV LAB;  Service: Cardiovascular;  Laterality: N/A;   AORTA -INNOMIATE BYPASS N/A 05/18/2019   Procedure: AORTA -INNOMIATE BYPASS Using Hemashield Gold Graft Size 8mm;  Surgeon: Lucas Dorise POUR, MD;  Location: [facility] OR;  Service: Open Heart Surgery;  Laterality: N/A;   aorto- fem bypass Bilateral    aortobiiliac bypass in 2000   DILATATION & CURETTAGE/HYSTEROSCOPY WITH MYOSURE N/A 12/27/2015   Procedure: DILATATION & CURETTAGE/HYSTEROSCOPY;  Surgeon: Bobie FORBES Cathlyn JAYSON Nikki, MD;  Location: [facility] ORS;  Service: Gynecology;  Laterality: N/A;   DILATION AND CURETTAGE OF UTERUS  11/2015   ELBOW SURGERY Left ~2007   tendon repair by Dr. Kay   EYE SURGERY Bilateral 2008   lasik   JOINT REPLACEMENT     lower aortic bypass  2000   per patient   PERIPHERAL VASCULAR INTERVENTION  12/03/2016   Stenting of anastomotic stenosis of the aortoiliac and right common iliac artery with 7 x 39 mm VBX covered stent and postdilated with 9 mm balloon.   Right innonimate bypass  05/18/2019   Aorto innominate bypass surgery with median sternotomy   STERNOTOMY  05/18/2019   Right aorto-innonimate bypass surgery 05/18/19 Dr. Lucas   TOTAL HIP ARTHROPLASTY Right 07/27/2014   Procedure: RIGHT TOTAL HIP ARTHROPLASTY ANTERIOR APPROACH;  Surgeon: Donnice Car, MD;  Location: [facility] ORS;  Service: Orthopedics;  Laterality: Right;   TOTAL KNEE ARTHROPLASTY Right 05/28/2022   Procedure: RIGHT TOTAL KNEE ARTHROPLASTY;  Surgeon: Jerri Kay HERO, MD;  Location: [facility] OR;  Service: Orthopedics;  Laterality: Right;   TOTAL KNEE ARTHROPLASTY Left 03/04/2023   Procedure: TOTAL KNEE ARTHROPLASTY;  Surgeon: Jerri Kay HERO, MD;  Location: [facility] OR;  Service: Orthopedics;  Laterality: Left;   Social History   Occupational History   Occupation: Tree surgeon  Tobacco Use   Smoking status: Former    Current packs/day: 0.00    Average packs/day: 0.5 packs/day for 40.0 years (20.0 ttl pk-yrs)    Types: Cigarettes    Start date: 02/23/1977    Quit date: 02/23/2017    Years since quitting: 7.0   Smokeless tobacco: Never  Vaping Use   Vaping status: Never Used  Substance and Sexual Activity   Alcohol use: Yes    Comment: very rare--maybe once a year   Drug use: No   Sexual activity: Yes    Partners: Male    Birth control/protection: Post-menopausal

## 2024-03-02 NOTE — Telephone Encounter (Signed)
 The patient has been notified and voiced understanding.  Order for ECHO has been placed.

## 2024-03-03 ENCOUNTER — Ambulatory Visit: Attending: Internal Medicine | Admitting: Pharmacist

## 2024-03-03 ENCOUNTER — Encounter: Payer: Self-pay | Admitting: Pharmacist

## 2024-03-03 DIAGNOSIS — I2510 Atherosclerotic heart disease of native coronary artery without angina pectoris: Secondary | ICD-10-CM | POA: Diagnosis present

## 2024-03-03 DIAGNOSIS — E7800 Pure hypercholesterolemia, unspecified: Secondary | ICD-10-CM | POA: Diagnosis present

## 2024-03-03 NOTE — Telephone Encounter (Signed)
 This encounter was created in error - please disregard.

## 2024-03-03 NOTE — Patient Instructions (Addendum)
 It was nice meeting you two today  We would like your LDL (bad cholesterol) to be less than 55  We would like your LPA to be less than 75  Please continue your atorvastatin  80mg  and ezetimibe  10mg  once a day  The medication we discussed today is called Repatha, which is an injection you would take once every 2 weeks  I will submit the prior authorization in January for you and contact you with the result  Once you are on the medication we will recheck your fasting lipid panel in 2-3 months  Medford Bolk, PharmD, BCACP, CDCES, CPP [facility] [address], [place], [place] [address] Phone: [phone]; Fax: [phone] 03/03/2024 10:02 AM

## 2024-03-03 NOTE — Progress Notes (Signed)
 Patient ID: CHANNEL PAPANDREA                 DOB: 1955/12/07                    MRN: [id number]     HPI: Diane Giles is a 68 y.o. female patient referred to lipid clinic by Jackee Alberts. PMH is significant for controlled T2DM, HTN, HLD, OSA, elevated LPA, and a history of smoking.   Patient presents today with daughter.  Currently managed on atorvastatin  80mg  and ezetimibe  10mg  daily with no patient reported adverse effects.  Is very physically active. Has been going to the gym daily. Will be going today after appointment.  Patient has been watching her diet. Eats about 1 meal per day and snacks on fruit. Drinks water and gatorade zero.   Current Medications:  Atorvastatin  80mg  daily Ezetimibe  10mg  daily  Intolerances: N/A  Risk Factors:  CAD T2DM Elevated LPA Former smoker  LDL goal: <55   Past Medical History:  Diagnosis Date   Aortic stenosis    mild-moderate AS 04/22/19 echo   Aortoiliac occlusive disease (HCC) 12/03/2016   Arthritis    Bowel obstruction (HCC)    Diabetes mellitus without complication (HCC)    pt states she has been told she is no longer diabetic and is not taking meds   Diverticulitis    Gout    Heart murmur    Hyperlipidemia    Hypertension    Osteoporosis    Sleep apnea    Tobacco use disorder 07/04/2012   Vitamin B12 deficiency     Current Outpatient Medications on File Prior to Visit  Medication Sig Dispense Refill   KERENDIA 10 MG TABS Take 1 tablet by mouth daily.     allopurinol  (ZYLOPRIM ) 100 MG tablet Take 1 tablet (100 mg total) by mouth daily. 90 tablet 1   atorvastatin  (LIPITOR ) 80 MG tablet Take 1 tablet (80 mg total) by mouth every evening. for cholesterol 90 tablet 1   buPROPion  (WELLBUTRIN  XL) 150 MG 24 hr tablet Take 1 tablet (150 mg total) by mouth daily. 90 tablet 1   CALCIUM  600 1500 (600 Ca) MG TABS tablet Take 1 tablet by mouth daily.     carvedilol  (COREG ) 12.5 MG tablet Take 1 tablet (12.5 mg total) by mouth 2  (two) times daily with a meal. 180 tablet 1   cholecalciferol  (VITAMIN D3) 25 MCG (1000 UNIT) tablet Take 1 tablet (25 mcg total) by mouth every morning. 90 tablet 4   dapagliflozin  propanediol (FARXIGA ) 10 MG TABS tablet Take 1 tablet (10 mg total) by mouth daily. 90 tablet 1   ezetimibe  (ZETIA ) 10 MG tablet Take 1 tablet (10 mg total) by mouth daily. for cholesterol. 90 tablet 1   losartan  (COZAAR ) 50 MG tablet Take 1 tablet (50 mg total) by mouth daily for blood pressure. If running above 130/80, increase to 2 tablets daily as directed (Patient taking differently: Take 50 mg by mouth daily.) 90 tablet 4   magnesium  oxide (MAG-OX) 400 (240 Mg) MG tablet Take 400 mg by mouth daily.     OZEMPIC, 0.25 OR 0.5 MG/DOSE, 2 MG/3ML SOPN Inject 0.5 mg into the skin once a week. Pt takes on Mondays     pregabalin  (LYRICA ) 50 MG capsule Take 1 capsule (50 mg total) by mouth 2 (two) times daily. 60 capsule 0   No current facility-administered medications on file prior to visit.    Allergies  Allergen Reactions   Codeine Itching    Assessment/Plan:  1. Hyperlipidemia - Patient's last lipid panel showed good control but LPA elevated. Advised that statins and ezetimibe  will likely not affect LPA and recommend starting PCSK9i.  Using demo pen, educated patient on mechanism of action, storage, site selection, administration, and possible adverse effects. Patient wishes for PA to be run in January 2026. Will repeat lipid panel 3 months after starting.  Recommended reducing Gatorade zero consumption due to high sodium content.  Continue atorvastatin  80mg  daily Continue ezetimibe  10mg  daily Start PCSK9i q 2 weeks Recheck lipid panel in 3 months  Chris Moshe Wenger, PharmD, BCACP, CDCES, CPP [facility] [address], [place], [place] [address] Phone: [phone]; Fax: [phone] 03/03/2024 5:07 PM

## 2024-03-04 ENCOUNTER — Ambulatory Visit: Payer: Self-pay

## 2024-03-04 ENCOUNTER — Ambulatory Visit: Admitting: Family Medicine

## 2024-03-04 ENCOUNTER — Other Ambulatory Visit (HOSPITAL_BASED_OUTPATIENT_CLINIC_OR_DEPARTMENT_OTHER): Payer: Self-pay

## 2024-03-04 ENCOUNTER — Encounter: Payer: Self-pay | Admitting: Family Medicine

## 2024-03-04 VITALS — BP 124/60 | HR 71 | Temp 97.6°F | Wt 140.8 lb

## 2024-03-04 DIAGNOSIS — R300 Dysuria: Secondary | ICD-10-CM

## 2024-03-04 LAB — POC URINALSYSI DIPSTICK (AUTOMATED)
Bilirubin, UA: POSITIVE
Blood, UA: POSITIVE
Glucose, UA: POSITIVE — AB
Ketones, UA: NEGATIVE
Nitrite, UA: POSITIVE
Protein, UA: POSITIVE — AB
Spec Grav, UA: 1.01 (ref 1.010–1.025)
Urobilinogen, UA: 4 U/dL — AB
pH, UA: 8 (ref 5.0–8.0)

## 2024-03-04 MED ORDER — CEPHALEXIN 500 MG PO CAPS
500.0000 mg | ORAL_CAPSULE | Freq: Two times a day (BID) | ORAL | 0 refills | Status: DC
  Filled 2024-03-04: qty 14, 7d supply, fill #0

## 2024-03-04 NOTE — Progress Notes (Signed)
 Established Patient Office Visit  Subjective   Patient ID: Diane Giles, female    DOB: 10-Oct-1955  Age: 68 y.o. MRN: [id number]  Chief Complaint  Patient presents with   Dysuria    HPI   Ms. Magan is seen today as a work in with onset earlier today of some urine frequency followed by burning with urination.  She had 1 episode of some gross hematuria earlier.  No fever.  No flank pain.  No nausea or vomiting.  Was started just yesterday on Kerendia per nephrologist and initially wondered if her symptoms may be related to that.  She does take Farxiga  regularly.  A1c's have been extremely well-controlled.  She does have history of stage IIIa chronic kidney disease with GFR around 45  Past Medical History:  Diagnosis Date   Aortic stenosis    mild-moderate AS 04/22/19 echo   Aortoiliac occlusive disease (HCC) 12/03/2016   Arthritis    Bowel obstruction (HCC)    Diabetes mellitus without complication (HCC)    pt states she has been told she is no longer diabetic and is not taking meds   Diverticulitis    Gout    Heart murmur    Hyperlipidemia    Hypertension    Osteoporosis    Sleep apnea    Tobacco use disorder 07/04/2012   Vitamin B12 deficiency    Past Surgical History:  Procedure Laterality Date   ABDOMINAL AORTOGRAM W/LOWER EXTREMITY N/A 12/03/2016   Procedure: Abdominal Aortogram w/Lower Extremity;  Surgeon: Eliza Lonni RAMAN, MD;  Location[facility] INVASIVE CV LAB;  Service: Cardiovascular;  Laterality: N/A;   AORTA -INNOMIATE BYPASS N/A 05/18/2019   Procedure: AORTA -INNOMIATE BYPASS Using Hemashield Gold Graft Size 8mm;  Surgeon: Lucas Dorise POUR, MD;  Location: [facility] OR;  Service: Open Heart Surgery;  Laterality: N/A;   aorto- fem bypass Bilateral    aortobiiliac bypass in 2000   DILATATION & CURETTAGE/HYSTEROSCOPY WITH MYOSURE N/A 12/27/2015   Procedure: DILATATION & CURETTAGE/HYSTEROSCOPY;  Surgeon: Bobie FORBES Cathlyn JAYSON Nikki, MD;  Location: [facility] ORS;  Service:  Gynecology;  Laterality: N/A;   DILATION AND CURETTAGE OF UTERUS  11/2015   ELBOW SURGERY Left ~2007   tendon repair by Dr. Kay   EYE SURGERY Bilateral 2008   lasik   JOINT REPLACEMENT     lower aortic bypass  2000   per patient   PERIPHERAL VASCULAR INTERVENTION  12/03/2016   Stenting of anastomotic stenosis of the aortoiliac and right common iliac artery with 7 x 39 mm VBX covered stent and postdilated with 9 mm balloon.   Right innonimate bypass  05/18/2019   Aorto innominate bypass surgery with median sternotomy   STERNOTOMY  05/18/2019   Right aorto-innonimate bypass surgery 05/18/19 Dr. Lucas   TOTAL HIP ARTHROPLASTY Right 07/27/2014   Procedure: RIGHT TOTAL HIP ARTHROPLASTY ANTERIOR APPROACH;  Surgeon: Donnice Car, MD;  Location: [facility];  Service: Orthopedics;  Laterality: Right;   TOTAL KNEE ARTHROPLASTY Right 05/28/2022   Procedure: RIGHT TOTAL KNEE ARTHROPLASTY;  Surgeon: Jerri Kay HERO, MD;  Location: [facility] OR;  Service: Orthopedics;  Laterality: Right;   TOTAL KNEE ARTHROPLASTY Left 03/04/2023   Procedure: TOTAL KNEE ARTHROPLASTY;  Surgeon: Jerri Kay HERO, MD;  Location: [facility] OR;  Service: Orthopedics;  Laterality: Left;    reports that she quit smoking about 7 years ago. Her smoking use included cigarettes. She started smoking about 47 years ago. She has a 20 pack-year smoking history. She has never used  smokeless tobacco. She reports current alcohol use. She reports that she does not use drugs. family history includes Cancer (age of onset: 37) in her father; Migraines in her daughter and mother; Rheum arthritis in her daughter; Thyroid  disease in her mother. Allergies  Allergen Reactions   Codeine Itching    Review of Systems  Constitutional:  Negative for chills and fever.  Gastrointestinal:  Negative for abdominal pain, nausea and vomiting.  Genitourinary:  Positive for dysuria, frequency and hematuria. Negative for flank pain.      Objective:     BP 124/60   Pulse  71   Temp 97.6 F (36.4 C) (Oral)   Wt 140 lb 12.8 oz (63.9 kg)   LMP 05/07/2005 (Approximate)   SpO2 98%   BMI 28.93 kg/m  BP Readings from Last 3 Encounters:  03/04/24 124/60  01/24/24 136/74  01/10/24 (!) 141/77   Wt Readings from Last 3 Encounters:  03/04/24 140 lb 12.8 oz (63.9 kg)  01/24/24 140 lb (63.5 kg)  01/10/24 140 lb 6.4 oz (63.7 kg)      Physical Exam Vitals reviewed.  Constitutional:      Appearance: Normal appearance.  Cardiovascular:     Rate and Rhythm: Normal rate and regular rhythm.  Pulmonary:     Effort: Pulmonary effort is normal.     Breath sounds: Normal breath sounds.  Neurological:     Mental Status: She is alert.      No results found for any visits on 03/04/24.    The ASCVD Risk score (Arnett DK, et al., 2019) failed to calculate for the following reasons:   The valid total cholesterol range is 130 to 320 mg/dL    Assessment & Plan:   Onset earlier today of urinary frequency, dysuria with burning with urination, and 1 episode of hematuria.  Patient does take Farxiga  at baseline but no prior history of recent UTI.  Just started yesterday on Kerendia.    Urine dipstick reveals leukocytes, nitrites, blood.  She has, as expected, glucosuria related to Farxiga .  Urine culture obtained.  Start Keflex 500 mg twice daily for 7 days pending culture results.  Hydrate well.  Follow-up immediately for any fever or worsening symptoms.  If having recurrent UTI will need to discuss with primary and nephrologist whether to continue Farxiga   Wolm Scarlet, MD

## 2024-03-04 NOTE — Telephone Encounter (Signed)
 FYI Only or Action Required?: FYI only for provider: appointment scheduled on 03/04/24.  Patient was last seen in primary care on 01/24/2024 by Billy Philippe SAUNDERS, NP.  Called Nurse Triage reporting Urinary Frequency.  Symptoms began today.  Interventions attempted: Nothing.  Symptoms are: unchanged.  Triage Disposition: See Physician Within 24 Hours  Patient/caregiver understands and will follow disposition?: Yes  Copied from CRM #[id number]. Topic: Clinical - Red Word Triage >> Mar 04, 2024 10:16 AM Willma SAUNDERS wrote: Red Word that prompted transfer to Nurse Triage: Patient states she thought she has a UTI, has an increased in frequency, and burning. Sent a message on MyChart but after she started bleeding. Says she started KERENDIA 10 MG TABS just this morning and is not sure if something more serious is going on. Reason for Disposition  Blood in urine (red, pink, or tea-colored)  Answer Assessment - Initial Assessment Questions Scheduled 03/04/24  Advised UC/ED if symptoms worsen.  1. SEVERITY: How bad is the pain?  (e.g., Scale 1-10; mild, moderate, or severe)     Burning with urination 2. FREQUENCY: How many times have you had painful urination today?      several 4. ONSET: When did the painful urination start?      This morning 5. FEVER: Do you have a fever? If Yes, ask: What is your temperature, how was it measured, and when did it start?     Denies fever, chills, n/v 6. PAST UTI: Have you had a urine infection before? If Yes, ask: When was the last time? and What happened that time?      yes 7. CAUSE: What do you think is causing the painful urination?  (e.g., UTI, scratch, Herpes sore)     Kerendia, new medication for kidneys 8. OTHER SYMPTOMS: Do you have any other symptoms? (e.g., blood in urine, flank pain, genital sores, urgency, vaginal discharge)  Blood when wiped, smear of blood Denies pain in abd/lower pain  Protocols used: Urination Pain  - Female-A-AH

## 2024-03-04 NOTE — Telephone Encounter (Signed)
 noted

## 2024-03-05 LAB — URINE CULTURE
MICRO NUMBER:: 17164939
SPECIMEN QUALITY:: ADEQUATE

## 2024-03-06 ENCOUNTER — Ambulatory Visit: Payer: Self-pay | Admitting: Family Medicine

## 2024-03-08 ENCOUNTER — Other Ambulatory Visit: Payer: Self-pay | Admitting: Family Medicine

## 2024-03-08 DIAGNOSIS — G629 Polyneuropathy, unspecified: Secondary | ICD-10-CM

## 2024-03-09 ENCOUNTER — Other Ambulatory Visit: Payer: Self-pay | Admitting: Family Medicine

## 2024-03-09 ENCOUNTER — Other Ambulatory Visit

## 2024-03-09 ENCOUNTER — Encounter: Payer: Self-pay | Admitting: Radiology

## 2024-03-09 DIAGNOSIS — E538 Deficiency of other specified B group vitamins: Secondary | ICD-10-CM

## 2024-03-09 LAB — VITAMIN B12: Vitamin B-12: 208 pg/mL — ABNORMAL LOW (ref 211–911)

## 2024-03-12 ENCOUNTER — Ambulatory Visit: Payer: Self-pay | Admitting: Family Medicine

## 2024-03-12 DIAGNOSIS — E538 Deficiency of other specified B group vitamins: Secondary | ICD-10-CM

## 2024-03-12 MED ORDER — CYANOCOBALAMIN 1000 MCG/ML IJ SOLN: 1000.0000 ug | INTRAMUSCULAR | 11 refills | Status: AC

## 2024-03-13 MED ORDER — NEEDLES & SYRINGES MISC: 1.0000 | 0 refills | Status: AC

## 2024-03-13 NOTE — Addendum Note (Signed)
 Addended by: ELNER NANNY B on: 03/13/2024 01:37 PM   Modules accepted: Orders

## 2024-03-31 ENCOUNTER — Encounter: Payer: Self-pay | Admitting: Neurology

## 2024-03-31 ENCOUNTER — Ambulatory Visit (INDEPENDENT_AMBULATORY_CARE_PROVIDER_SITE_OTHER): Admitting: Neurology

## 2024-03-31 VITALS — BP 117/61 | HR 69 | Ht 58.5 in | Wt 137.0 lb

## 2024-03-31 DIAGNOSIS — R2681 Unsteadiness on feet: Secondary | ICD-10-CM | POA: Diagnosis not present

## 2024-03-31 NOTE — Patient Instructions (Signed)
 Nerve testing of the legs  Referral to physical therapy for balance training

## 2024-03-31 NOTE — Progress Notes (Signed)
 [facility] Clinic Note - Initial Visit   Date: 03/31/2024   Diane Giles MRN: [id number] DOB: 01-17-1956   Dear Philippe Slade, NP:  Thank you for your kind referral of Diane Giles for consultation of gait abnormality. Although her history is well known to you, please allow us  to reiterate it for the purpose of our medical record. The patient was accompanied to the clinic by husband who also provides collateral information.     Diane Giles is a 68 y.o. right-handed female with diabetes mellitus, anxiety, gout, hypertension, hyperlipidemia, CKD stage 3, tobacco use, and PAD s/p aortoiliac bypass presenting for evaluation of gait abnormality.   IMPRESSION/PLAN: Assessment & Plan Bilateral episodic leg weakness and gait ataxia, no identifiable etiology.  MRI lumbar spine shows mild degenerative changes, no spinal canal stenosis.  ABI is normal.   - Ordered nerve conduction study and EMG to assess nerve and muscle function in the legs. - Referred to physical therapy for balance and gait training.  2.   Episodic brain fog, no associated dizziness.  MRI brain from 2021 shows chronic white matter changes. MRA with moderate to severe intracranial stenosis involving particularly R ICA and left PCA, however, vertebral and basilar artery is patent.  She does have low vitamin B12, which can cause cognitive changes.  Mood disorder may also contribute to this.  - Continue vitamin B12 supplementation - Will consider repeating MRI of the brain if symptoms persist or worsen. - Continue ASA + statin for intracranial stenosis   Further recommendations pending results.   ------------------------------------------------------------- History of present illness:  Discussed the use of AI scribe software for clinical note transcription with the patient, who gave verbal consent to proceed.  History of Present Illness She has experienced leg weakness for  several years, with a notable worsening over the past six months. Initially, the weakness occurred once or twice a year, but it has now become a daily issue. She describes an inability to walk during episodes of weakness, requiring her to stop and stand still for three to four minutes before resuming walking. The weakness affects her entire legs and is sometimes accompanied by pain when sensation returns. No falls have been reported, but there have been recent instances of her legs buckling. She does not use a cane or walker.  She goes to the gym 5 days per week.   She has a history of low vitamin B12 and is currently receiving weekly injections, having started this regimen three months ago after an initial month of treatment. She also has a history of diabetes, which she considers well-controlled, and is on Ozempic for weight loss and kidney protection. She has stage 3 kidney disease and takes Lipitor  for cholesterol management. She takes baby aspirin  daily.  She experiences episodes of dizziness described as 'fog' rather than spinning, lasting about 20 minutes. These episodes have been occurring for a couple of years and do not happen daily. She feels unsteady during these episodes but has not lost consciousness or awareness. She has not undergone physical therapy for balance or walking issues recently, though she has had therapy for her knees and back in the past year.  She had an MRI of her low back in 2024 showing arthritis and a disc protrusion at L5-S1 towards the left, and a brain MRI in 2021 showing chronic white matter changes.  CTA head shows diffuse intracranial stenosis, worse in the RICA. She does not smoke or drink alcohol and lives  with her son and grandson.  Of note, she was evaluated for the same symptoms by my colleague, Dr. Skeet, here in 2021.    Out-side paper records, electronic medical record, and images have been reviewed where available and summarized as:  MRI lumbar spine wo  contrast 08/29/2022: 1. Mild lumbar spondylosis without spinal canal stenosis. 2. Central disc protrusion at L5-S1 displaces the traversing left S1 nerve root in the subarticular zone.  MRI cervical spine wo contrast 02/11/2023: 1. At C5-6 there is a broad-based disc osteophyte complex with a small central disc protrusion. Mild bilateral foraminal stenosis. 2. No acute osseous injury of the cervical spine.   Lab Results  Component Value Date   HGBA1C 5.3 01/24/2024   Lab Results  Component Value Date   VITAMINB12 208 (L) 03/09/2024   Lab Results  Component Value Date   TSH 2.36 12/04/2023   Lab Results  Component Value Date   ESRSEDRATE 52 (H) 01/05/2021    Past Medical History:  Diagnosis Date   Aortic stenosis    mild-moderate AS 04/22/19 echo   Aortoiliac occlusive disease (HCC) 12/03/2016   Arthritis    Bowel obstruction (HCC)    Diabetes mellitus without complication (HCC)    pt states she has been told she is no longer diabetic and is not taking meds   Diverticulitis    Gout    Heart murmur    Hyperlipidemia    Hypertension    Osteoporosis    Sleep apnea    Tobacco use disorder 07/04/2012   Vitamin B12 deficiency     Past Surgical History:  Procedure Laterality Date   ABDOMINAL AORTOGRAM W/LOWER EXTREMITY N/A 12/03/2016   Procedure: Abdominal Aortogram w/Lower Extremity;  Surgeon: Eliza Lonni RAMAN, MD;  Location: [facility] INVASIVE CV LAB;  Service: Cardiovascular;  Laterality: N/A;   AORTA -INNOMIATE BYPASS N/A 05/18/2019   Procedure: AORTA -INNOMIATE BYPASS Using Hemashield Gold Graft Size 8mm;  Surgeon: Lucas Dorise POUR, MD;  Location: [facility] OR;  Service: Open Heart Surgery;  Laterality: N/A;   aorto- fem bypass Bilateral    aortobiiliac bypass in 2000   DILATATION & CURETTAGE/HYSTEROSCOPY WITH MYOSURE N/A 12/27/2015   Procedure: DILATATION & CURETTAGE/HYSTEROSCOPY;  Surgeon: Bobie FORBES Cathlyn JAYSON Nikki, MD;  Location: [facility] ORS;  Service: Gynecology;  Laterality:  N/A;   DILATION AND CURETTAGE OF UTERUS  11/2015   ELBOW SURGERY Left ~2007   tendon repair by Dr. Kay   EYE SURGERY Bilateral 2008   lasik   JOINT REPLACEMENT     lower aortic bypass  2000   per patient   PERIPHERAL VASCULAR INTERVENTION  12/03/2016   Stenting of anastomotic stenosis of the aortoiliac and right common iliac artery with 7 x 39 mm VBX covered stent and postdilated with 9 mm balloon.   Right innonimate bypass  05/18/2019   Aorto innominate bypass surgery with median sternotomy   STERNOTOMY  05/18/2019   Right aorto-innonimate bypass surgery 05/18/19 Dr. Lucas   TOTAL HIP ARTHROPLASTY Right 07/27/2014   Procedure: RIGHT TOTAL HIP ARTHROPLASTY ANTERIOR APPROACH;  Surgeon: Donnice Car, MD;  Location: [facility] ORS;  Service: Orthopedics;  Laterality: Right;   TOTAL KNEE ARTHROPLASTY Right 05/28/2022   Procedure: RIGHT TOTAL KNEE ARTHROPLASTY;  Surgeon: Jerri Kay HERO, MD;  Location: [facility] OR;  Service: Orthopedics;  Laterality: Right;   TOTAL KNEE ARTHROPLASTY Left 03/04/2023   Procedure: TOTAL KNEE ARTHROPLASTY;  Surgeon: Jerri Kay HERO, MD;  Location: [facility] OR;  Service: Orthopedics;  Laterality: Left;  Medications:  Outpatient Encounter Medications as of 03/31/2024  Medication Sig   allopurinol  (ZYLOPRIM ) 100 MG tablet Take 1 tablet (100 mg total) by mouth daily.   atorvastatin  (LIPITOR ) 80 MG tablet Take 1 tablet (80 mg total) by mouth every evening. for cholesterol   buPROPion  (WELLBUTRIN  XL) 150 MG 24 hr tablet Take 1 tablet (150 mg total) by mouth daily.   CALCIUM  600 1500 (600 Ca) MG TABS tablet Take 1 tablet by mouth daily.   carvedilol  (COREG ) 12.5 MG tablet Take 1 tablet (12.5 mg total) by mouth 2 (two) times daily with a meal.   cholecalciferol  (VITAMIN D3) 25 MCG (1000 UNIT) tablet Take 1 tablet (25 mcg total) by mouth every morning.   cyanocobalamin  (VITAMIN B12) 1000 MCG/ML injection Inject 1 mL (1,000 mcg total) into the muscle once a week for 12 doses.    dapagliflozin  propanediol (FARXIGA ) 10 MG TABS tablet Take 1 tablet (10 mg total) by mouth daily.   ezetimibe  (ZETIA ) 10 MG tablet Take 1 tablet (10 mg total) by mouth daily. for cholesterol.   KERENDIA 10 MG TABS Take 1 tablet by mouth daily.   losartan  (COZAAR ) 50 MG tablet Take 1 tablet by mouth once daily for blood pressure.   magnesium  oxide (MAG-OX) 400 (240 Mg) MG tablet Take 400 mg by mouth daily.   Needles & Syringes MISC 1 each by Does not apply route once a week.   OZEMPIC, 0.25 OR 0.5 MG/DOSE, 2 MG/3ML SOPN Inject 1 mg into the skin once a week. Pt takes on Mondays   pregabalin  (LYRICA ) 50 MG capsule Take 1 capsule by mouth twice daily.   cephALEXin  (KEFLEX ) 500 MG capsule Take 1 capsule (500 mg total) by mouth 2 (two) times daily. (Patient not taking: Reported on 03/31/2024)   No facility-administered encounter medications on file as of 03/31/2024.    Allergies:  Allergies  Allergen Reactions   Codeine Itching    Family History: Family History  Problem Relation Age of Onset   Migraines Mother    Thyroid  disease Mother        hypothyroid   Cancer Father 25       Dec age 89 with pancreatic Ca   Rheum arthritis Daughter    Migraines Daughter     Social History: Social History   Tobacco Use   Smoking status: Former    Current packs/day: 0.00    Average packs/day: 0.5 packs/day for 40.0 years (20.0 ttl pk-yrs)    Types: Cigarettes    Start date: 02/23/1977    Quit date: 02/23/2017    Years since quitting: 7.1   Smokeless tobacco: Never  Vaping Use   Vaping status: Never Used  Substance Use Topics   Alcohol use: Not Currently    Comment: very rare--maybe once a year   Drug use: No   Social History   Social History Narrative   Married. Patient does not exercise. She has a Geographical information systems officer.   Right handed   Lives with husband in two story home      Are you right handed or left handed? Right Handed   Are you currently employed ? No   What is your current  occupation?   Do you live at home alone? No    Who lives with you? Husband    What type of home do you live in: 1 story or 2 story? Lives in a two story home. One set of steps.  Vital Signs:  BP 117/61   Pulse 69   Ht 4' 10.5 (1.486 m)   Wt 137 lb (62.1 kg)   LMP 05/07/2005 (Approximate)   SpO2 98%   BMI 28.15 kg/m    Neurological Exam: MENTAL STATUS including orientation to time, place, person, recent and remote memory, attention span and concentration, language, and fund of knowledge is normal.  Speech is not dysarthric.  CRANIAL NERVES: II:  No visual field defects.     III-IV-VI: Pupils equal round and reactive to light.  Normal conjugate, extra-ocular eye movements in all directions of gaze.  No nystagmus.  No ptosis.   V:  Normal facial sensation.    VII:  Normal facial symmetry and movements.   VIII:  Normal hearing and vestibular function.   IX-X:  Normal palatal movement.   XI:  Normal shoulder shrug and head rotation.   XII:  Normal tongue strength and range of motion, no deviation or fasciculation.  MOTOR:  No atrophy, fasciculations or abnormal movements.  No pronator drift.   Upper Extremity:  Right  Left  Deltoid  5/5   5/5   Biceps  5/5   5/5   Triceps  5/5   5/5   Wrist extensors  5/5   5/5   Wrist flexors  5/5   5/5   Finger extensors  5/5   5/5   Finger flexors  5/5   5/5   Dorsal interossei  5/5   5/5   Abductor pollicis  5/5   5/5   Tone (Ashworth scale)  0  0   Lower Extremity:  Right  Left  Hip flexors  5/5   5/5   Knee flexors  5/5   5/5   Knee extensors  5/5   5/5   Dorsiflexors  5/5   5/5   Plantarflexors  5/5   5/5   Toe extensors  5/5   5/5   Toe flexors  5/5   5/5   Tone (Ashworth scale)  0  0   MSRs:                                           Right        Left brachioradialis 2+  2+  biceps 2+  2+  triceps 2+  2+  patellar 1+  1+  ankle jerk 1+  1+  Hoffman no  no  plantar response down  down   SENSORY:  Normal and  symmetric perception of light touch, pinprick, vibration.  Romberg's sign absent.   COORDINATION/GAIT: Normal finger-to- nose-finger.  Intact rapid alternating movements bilaterally.  Able to rise from a chair without using arms. Gait is mildly wide-based, unsteady at times, unassisted.  Stressed gait intact.  Unable to perform tandem gait.     Thank you for allowing me to participate in patient's care.  If I can answer any additional questions, I would be pleased to do so.    Sincerely,    Leonides Minder K. Tobie, DO

## 2024-04-21 ENCOUNTER — Ambulatory Visit: Admitting: Family Medicine

## 2024-04-24 ENCOUNTER — Ambulatory Visit: Admitting: Family Medicine

## 2024-05-08 ENCOUNTER — Other Ambulatory Visit (HOSPITAL_COMMUNITY): Payer: Self-pay

## 2024-05-08 ENCOUNTER — Telehealth: Payer: Self-pay | Admitting: Pharmacy Technician

## 2024-05-08 NOTE — Telephone Encounter (Signed)
 Pharmacy Patient Advocate Encounter   Received notification from Physician's Office that prior authorization for repatha is required/requested.   Insurance verification completed.   The patient is insured through [facility].   Per test claim: PA required; PA submitted to above mentioned insurance via Latent Key/confirmation #/EOC [facility] Status is pending

## 2024-05-08 NOTE — Telephone Encounter (Signed)
 Pharmacy Patient Advocate Encounter  Received notification from [employer] that Prior Authorization for repatha has been DENIED.  Full denial letter will be uploaded to the media tab. See denial reason below.   PA #/Case ID/Reference #: [id number]

## 2024-05-11 ENCOUNTER — Telehealth: Payer: Self-pay | Admitting: Pharmacy Technician

## 2024-05-11 ENCOUNTER — Other Ambulatory Visit (HOSPITAL_BASED_OUTPATIENT_CLINIC_OR_DEPARTMENT_OTHER): Payer: Self-pay

## 2024-05-11 ENCOUNTER — Other Ambulatory Visit (HOSPITAL_COMMUNITY): Payer: Self-pay

## 2024-05-11 DIAGNOSIS — I2510 Atherosclerotic heart disease of native coronary artery without angina pectoris: Secondary | ICD-10-CM

## 2024-05-11 DIAGNOSIS — I739 Peripheral vascular disease, unspecified: Secondary | ICD-10-CM

## 2024-05-11 DIAGNOSIS — I708 Atherosclerosis of other arteries: Secondary | ICD-10-CM

## 2024-05-11 MED ORDER — PRALUENT 75 MG/ML ~~LOC~~ SOAJ
75.0000 mg | SUBCUTANEOUS | 5 refills | Status: DC
  Filled 2024-05-11: qty 2, 28d supply, fill #0

## 2024-05-11 NOTE — Addendum Note (Signed)
 Addended by: DARRELL BAREFOOT on: 05/11/2024 01:50 PM   Modules accepted: Orders

## 2024-05-11 NOTE — Telephone Encounter (Addendum)
 Pharmacy Patient Advocate Encounter   Received notification from Physician's Office- chris P that prior authorization for praluent  75 is required/requested.   Insurance verification completed.   The patient is insured through [facility].   Per test claim: PA required; PA submitted to above mentioned insurance via Latent Key/confirmation #/EOC [id number] Status is pending

## 2024-05-11 NOTE — Telephone Encounter (Signed)
 Pharmacy Patient Advocate Encounter  Received notification from [facility] that Prior Authorization for praluent  has been APPROVED from 05/11/24 to 05/06/2098. Ran test claim, Copay is $461.27- one month. This test claim was processed through [facility]- copay amounts may vary at other pharmacies due to pharmacy/plan contracts, or as the patient moves through the different stages of their insurance plan.   PA #/Case ID/Reference #: [id number]

## 2024-05-12 ENCOUNTER — Other Ambulatory Visit: Payer: Self-pay | Admitting: Family Medicine

## 2024-05-12 ENCOUNTER — Other Ambulatory Visit (HOSPITAL_BASED_OUTPATIENT_CLINIC_OR_DEPARTMENT_OTHER): Payer: Self-pay

## 2024-05-18 ENCOUNTER — Encounter: Payer: Self-pay | Admitting: Family Medicine

## 2024-05-18 ENCOUNTER — Other Ambulatory Visit: Payer: Self-pay

## 2024-05-18 ENCOUNTER — Ambulatory Visit: Admitting: Family Medicine

## 2024-05-18 VITALS — BP 132/82 | HR 69 | Temp 98.2°F | Ht <= 58 in | Wt 139.0 lb

## 2024-05-18 DIAGNOSIS — M109 Gout, unspecified: Secondary | ICD-10-CM | POA: Diagnosis not present

## 2024-05-18 DIAGNOSIS — E538 Deficiency of other specified B group vitamins: Secondary | ICD-10-CM | POA: Diagnosis not present

## 2024-05-18 DIAGNOSIS — E1165 Type 2 diabetes mellitus with hyperglycemia: Secondary | ICD-10-CM

## 2024-05-18 DIAGNOSIS — Z7985 Long-term (current) use of injectable non-insulin antidiabetic drugs: Secondary | ICD-10-CM

## 2024-05-18 DIAGNOSIS — F419 Anxiety disorder, unspecified: Secondary | ICD-10-CM | POA: Diagnosis not present

## 2024-05-18 DIAGNOSIS — E782 Mixed hyperlipidemia: Secondary | ICD-10-CM

## 2024-05-18 DIAGNOSIS — I10 Essential (primary) hypertension: Secondary | ICD-10-CM

## 2024-05-18 DIAGNOSIS — G629 Polyneuropathy, unspecified: Secondary | ICD-10-CM | POA: Diagnosis not present

## 2024-05-18 DIAGNOSIS — Z7984 Long term (current) use of oral hypoglycemic drugs: Secondary | ICD-10-CM

## 2024-05-18 DIAGNOSIS — E663 Overweight: Secondary | ICD-10-CM | POA: Diagnosis not present

## 2024-05-18 MED ORDER — OZEMPIC (0.25 OR 0.5 MG/DOSE) 2 MG/3ML ~~LOC~~ SOPN: 1.0000 mg | PEN_INJECTOR | SUBCUTANEOUS | 3 refills | Status: AC

## 2024-05-18 NOTE — Assessment & Plan Note (Addendum)
 Stable. Continue Alirocumab  injections every 2 weeks through cardiology, and Atorvastain 80 mg daily, and Ezetimibe  10 mg daily. Cardiology is following. Ordered lipid panel and CMP. Patient is aware to be fasting at her lab appointment on 02/02.

## 2024-05-18 NOTE — Patient Instructions (Addendum)
 It was nice to see you today. Today we: -Discussed chronic gait disturbance. Continue to follow up with Neurology and Cardiology. -Discussed chronic medical management. -Please let the provider know via MyChart which pharmacy to send Ozempic  refills to. -Plan to draw labs (please be fasting) on next visit 06/08/24 (CBC, CMP, Vit B12, lipid panel, HgA1c). -Follow up in 7 months.

## 2024-05-18 NOTE — Assessment & Plan Note (Signed)
 Ordered lab. Takes vit B 12 injections weekly 1000mcg/mL.

## 2024-05-18 NOTE — Assessment & Plan Note (Addendum)
 Seeing Neurology and Cardiology for ongoing gait disturbance. Begins Physical therapy next week.

## 2024-05-18 NOTE — Progress Notes (Signed)
 "  Established Patient Office Visit   Subjective:  Patient ID: Diane Giles, female    DOB: Sep 01, 1955  Age: 69 y.o. MRN: [id number]  Chief Complaint  Patient presents with   Medical Management of Chronic Issues    Follow up     Diane Giles presents with a chief complaint of management of chronic issues.  Diabetes: Chronic. Patient is prescribed Ozempic  0.5mg  injection weekly and taking Farxiga  10mg  daily. Had an elevated microalbumin. Also, on Kerenda with [facility].  She reports she previously been on Metformin , but with weight loss she was able to stop medication. She has come off Ozempic  before, gained about 13Ibs, and went back on medication. She receives the medication directly from company, and will run out by the end of February.  Lab Results  Component Value Date   HGBA1C 5.3 01/24/2024    Seen Neurology on 03/31/24 for gait disturbance. She expresses frustration. She reports that none of the doctors are doing anything for me. She says that when she sits for a long time and then gets up to move her legs she can not feel them intermittently. She believes it is a nerve pinch. Reports she has a burning sensation from the hip that radiates downward that last seconds to minutes. Reports no falls, no dizziness. Her concern is that this used to happen once or twice a month, now episodes happen every day. Cardiology has since seen her for this complaint with no findings. Neurology is following. She begins physical therapy next week. Scheduled for lower EMGs in February.   However, she reports that prior head fog has improved. May be contributing to taking Vitamin 12 1,000mcg injection every week. Neurology agreed this could be a reason for the cognitive impairment. If no improvement or it worsens, neurology will follow up with MRI head scan.   Neuropathy: Chronic. She is gradually weaning herself off of Lyrica  at her choice. Currently, taking 50mg  tablet at night. She reports she has  about 1.5 weeks of medication and does not want a refill.  Reports no side effects. Reports no pain.   Gout: Chronic. Patient is prescribed Allopurinol  100mg  daily. Not had a gout attack in years.   HTN: Chronic. Patient is prescribed Carvedilol  6.25 BID and Losartan  50mg  daily. Previously, cardiology changed Atenolol  to Carvedilol  because of elevated BPs in the evenings. She reports her blood pressure has been good.  BP Readings from Last 3 Encounters:  05/18/24 132/82  03/31/24 117/61  03/04/24 124/60    Hyperlipidemia: Chronic. Cardiology has made changes to her cholesterol medication. She is  Alirocumab  (Praluent ) injections every 2 weeks, still on Atorvastatin  80mg  daily and Ezetimibe  10mg  daily.  Lab Results  Component Value Date   CHOL 127 01/24/2024   HDL 52 01/24/2024   LDLCALC 56 01/24/2024   TRIG 109 01/24/2024   CHOLHDL 2.4 01/24/2024    Anxiety: Patient is prescribed Bupropion  150mg  daily. Effective   HPI Review of Systems  Constitutional: Negative.   HENT: Negative.    Eyes: Negative.   Respiratory: Negative.    Cardiovascular: Negative.   Gastrointestinal: Negative.   Genitourinary: Negative.   Musculoskeletal: Negative.   Skin: Negative.   Neurological: Negative.   Psychiatric/Behavioral: Negative.      Objective:    BP 132/82   Pulse 69   Temp 98.2 F (36.8 C) (Oral)   Ht 4' 10 (1.473 m)   Wt 139 lb (63 kg)   LMP 05/07/2005   SpO2 99%  BMI 29.05 kg/m  BP Readings from Last 3 Encounters:  05/18/24 132/82  03/31/24 117/61  03/04/24 124/60   Wt Readings from Last 3 Encounters:  05/18/24 139 lb (63 kg)  03/31/24 137 lb (62.1 kg)  03/04/24 140 lb 12.8 oz (63.9 kg)   Physical Exam Constitutional:      Appearance: Normal appearance. She is normal weight.  Cardiovascular:     Rate and Rhythm: Normal rate and regular rhythm.     Heart sounds: Murmur heard.  Pulmonary:     Effort: Pulmonary effort is normal.     Breath sounds: Normal breath  sounds.  Skin:    General: Skin is warm and dry.  Neurological:     General: No focal deficit present.     Mental Status: She is alert and oriented to person, place, and time. Mental status is at baseline.  Psychiatric:        Mood and Affect: Mood normal.        Behavior: Behavior normal.        Thought Content: Thought content normal.        Judgment: Judgment normal.      Assessment & Plan:  Type 2 diabetes mellitus with hyperglycemia, without long-term current use of insulin  (HCC) Assessment & Plan: Stable. Continue Ozempic  0.5mg  injection weekly and Farxiga  10 mg daily. On Kerenda with [facility]. Patient will send a MyChart about which pharmacy she would like her Ozempic  prescription to be sent to. She is not a fan of [facility]. [facility] pharmacy reports they do no have medication on hand. Order CMP and A1c. UTD on microalbumin/creatinine urine ration, ophthalmology exam, and foot exam. She is on an ARB and statin.   Orders: -     Comprehensive metabolic panel with GFR; Future -     Hemoglobin A1c; Future  Neuropathy Assessment & Plan: Seeing Neurology and Cardiology for ongoing gait disturbance. Begins Physical therapy next week.   Essential hypertension, benign Assessment & Plan: Stable in  office today. Continue Carvedilol  6.25 BID and Losartan  50mg  daily. Cardiology is following. Reports that her BP has been good. Ordered CMP.   Orders: -     Comprehensive metabolic panel with GFR; Future  Gout, unspecified cause, unspecified chronicity, unspecified site Assessment & Plan: Taking Allopurinol  100mg  daily. Effective.   Mixed hyperlipidemia Assessment & Plan: Stable. Continue Alirocumab  injections every 2 weeks through cardiology, and Atorvastain 80 mg daily, and Ezetimibe  10 mg daily. Cardiology is following. Ordered lipid panel and CMP. Patient is aware to be fasting at her lab appointment on 02/02.   Orders: -     Comprehensive metabolic panel  with GFR; Future -     Lipid panel; Future  Anxiety Assessment & Plan: Scored 1 on PHQ-9 and 1 on GAD-8. Continue taking Bupropion  150mg  daily. Effective.   Vitamin B12 deficiency Assessment & Plan: Ordered lab. Takes vit B 12 injections weekly 1000mcg/mL.   Overweight -     CBC with Differential/Platelet; Future -     Comprehensive metabolic panel with GFR; Future -     Lipid panel; Future -     Hemoglobin A1c; Future    -Mammogram: Denies. -COVID: Denies.  -Discussed chronic gait disturbance. She is agreeable to continue to follow up with Neurology and Cardiology. -Discussed chronic medical management. -Plan to draw labs (please be fasting) on visit 06/08/24 (CBC, CMP, Vit B12, lipid panel, HgA1c). -Follow up in 7 months.   JoAnna Williamson, NP  "

## 2024-05-18 NOTE — Assessment & Plan Note (Addendum)
 Stable. Continue Ozempic  0.5mg  injection weekly and Farxiga  10 mg daily. On Kerenda with [facility]. Patient will send a MyChart about which pharmacy she would like her Ozempic  prescription to be sent to. She is not a fan of [facility]. [facility] pharmacy reports they do no have medication on hand. Order CMP and A1c. UTD on microalbumin/creatinine urine ration, ophthalmology exam, and foot exam. She is on an ARB and statin.

## 2024-05-18 NOTE — Assessment & Plan Note (Addendum)
 Scored 1 on PHQ-9 and 1 on GAD-8. Continue taking Bupropion  150mg  daily. Effective.

## 2024-05-18 NOTE — Assessment & Plan Note (Addendum)
 Stable in  office today. Continue Carvedilol  6.25 BID and Losartan  50mg  daily. Cardiology is following. Reports that her BP has been good. Ordered CMP.

## 2024-05-18 NOTE — Assessment & Plan Note (Addendum)
 Taking Allopurinol  100mg  daily. Effective.

## 2024-05-20 ENCOUNTER — Other Ambulatory Visit (HOSPITAL_BASED_OUTPATIENT_CLINIC_OR_DEPARTMENT_OTHER): Payer: Self-pay

## 2024-05-22 ENCOUNTER — Telehealth: Payer: Self-pay

## 2024-05-22 ENCOUNTER — Other Ambulatory Visit (HOSPITAL_COMMUNITY): Payer: Self-pay

## 2024-05-22 NOTE — Telephone Encounter (Signed)
 Pharmacy Patient Advocate Encounter   Received notification from Physician's Office that prior authorization for ozempic  2 mg/3 ml pen  is required/requested.   Insurance verification completed.   The patient is insured through [employer].   Per test claim: The current 28 day co-pay is, $11.  No PA needed at this time. This test claim was processed through [facility]- copay amounts may vary at other pharmacies due to pharmacy/plan contracts, or as the patient moves through the different stages of their insurance plan.

## 2024-05-22 NOTE — Telephone Encounter (Signed)
 Patient is following up.

## 2024-05-25 ENCOUNTER — Ambulatory Visit (HOSPITAL_BASED_OUTPATIENT_CLINIC_OR_DEPARTMENT_OTHER): Attending: Neurology | Admitting: Physical Therapy

## 2024-05-25 ENCOUNTER — Encounter (HOSPITAL_BASED_OUTPATIENT_CLINIC_OR_DEPARTMENT_OTHER): Payer: Self-pay | Admitting: Physical Therapy

## 2024-05-25 ENCOUNTER — Other Ambulatory Visit: Payer: Self-pay

## 2024-05-25 DIAGNOSIS — M6281 Muscle weakness (generalized): Secondary | ICD-10-CM

## 2024-05-25 DIAGNOSIS — R2689 Other abnormalities of gait and mobility: Secondary | ICD-10-CM

## 2024-05-25 DIAGNOSIS — R2681 Unsteadiness on feet: Secondary | ICD-10-CM | POA: Insufficient documentation

## 2024-05-25 DIAGNOSIS — M5459 Other low back pain: Secondary | ICD-10-CM

## 2024-05-25 DIAGNOSIS — M546 Pain in thoracic spine: Secondary | ICD-10-CM

## 2024-05-25 DIAGNOSIS — R262 Difficulty in walking, not elsewhere classified: Secondary | ICD-10-CM

## 2024-05-25 NOTE — Progress Notes (Signed)
 " OUTPATIENT PHYSICAL THERAPY THORACOLUMBAR EVALUATION   Patient Name: Diane Giles MRN: [id number] DOB:06-Sep-1955, 69 y.o., female Today's Date: 05/26/2024  END OF SESSION:  PT End of Session - 05/25/24 1436     Visit Number 4    Number of Visits 16    Date for Recertification  07/20/24    Authorization Type MCR    Activity Tolerance Patient tolerated treatment well    Behavior During Therapy [facility] for tasks assessed/performed          Past Medical History:  Diagnosis Date   Aortic stenosis    mild-moderate AS 04/22/19 echo   Aortoiliac occlusive disease (HCC) 12/03/2016   Arthritis    Bowel obstruction (HCC)    Diabetes mellitus without complication (HCC)    pt states she has been told she is no longer diabetic and is not taking meds   Diverticulitis    Gout    Heart murmur    Hyperlipidemia    Hypertension    Osteoporosis    Sleep apnea    Tobacco use disorder 07/04/2012   Vitamin B12 deficiency    Past Surgical History:  Procedure Laterality Date   ABDOMINAL AORTOGRAM W/LOWER EXTREMITY N/A 12/03/2016   Procedure: Abdominal Aortogram w/Lower Extremity;  Surgeon: Eliza Lonni RAMAN, MD;  Location: [facility] INVASIVE CV LAB;  Service: Cardiovascular;  Laterality: N/A;   AORTA -INNOMIATE BYPASS N/A 05/18/2019   Procedure: AORTA -INNOMIATE BYPASS Using Hemashield Gold Graft Size 8mm;  Surgeon: Lucas Dorise POUR, MD;  Location: [facility] OR;  Service: Open Heart Surgery;  Laterality: N/A;   aorto- fem bypass Bilateral    aortobiiliac bypass in 2000   DILATATION & CURETTAGE/HYSTEROSCOPY WITH MYOSURE N/A 12/27/2015   Procedure: DILATATION & CURETTAGE/HYSTEROSCOPY;  Surgeon: Bobie FORBES Cathlyn JAYSON Nikki, MD;  Location: [facility] ORS;  Service: Gynecology;  Laterality: N/A;   DILATION AND CURETTAGE OF UTERUS  11/2015   ELBOW SURGERY Left ~2007   tendon repair by Dr. Kay   EYE SURGERY Bilateral 2008   lasik   JOINT REPLACEMENT     lower aortic bypass  2000   per patient   PERIPHERAL  VASCULAR INTERVENTION  12/03/2016   Stenting of anastomotic stenosis of the aortoiliac and right common iliac artery with 7 x 39 mm VBX covered stent and postdilated with 9 mm balloon.   Right innonimate bypass  05/18/2019   Aorto innominate bypass surgery with median sternotomy   STERNOTOMY  05/18/2019   Right aorto-innonimate bypass surgery 05/18/19 Dr. Lucas   TOTAL HIP ARTHROPLASTY Right 07/27/2014   Procedure: RIGHT TOTAL HIP ARTHROPLASTY ANTERIOR APPROACH;  Surgeon: Donnice Car, MD;  Location: [facility] ORS;  Service: Orthopedics;  Laterality: Right;   TOTAL KNEE ARTHROPLASTY Right 05/28/2022   Procedure: RIGHT TOTAL KNEE ARTHROPLASTY;  Surgeon: Jerri Kay HERO, MD;  Location: [facility] OR;  Service: Orthopedics;  Laterality: Right;   TOTAL KNEE ARTHROPLASTY Left 03/04/2023   Procedure: TOTAL KNEE ARTHROPLASTY;  Surgeon: Jerri Kay HERO, MD;  Location: [facility] OR;  Service: Orthopedics;  Laterality: Left;   Patient Active Problem List   Diagnosis Date Noted   Gout 10/22/2023   Anxiety 10/22/2023   Neuropathy 10/22/2023   SBO (small bowel obstruction) (HCC) 07/13/2023   Status post total left knee replacement 03/04/2023   Primary osteoarthritis of left knee 07/10/2022   Status post total right knee replacement 05/28/2022   Vitamin B12 deficiency 10/31/2021   Small bowel obstruction (HCC)    Partial small bowel obstruction (HCC) 10/28/2021  PAD (peripheral artery disease)    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   Aortoiliac occlusive disease (HCC) 12/03/2016   Centrilobular emphysema (HCC) 10/03/2016   Sacroiliitis 02/27/2016   Psoas tendinitis of right side 10/21/2015   Endometrial thickening on ultrasound 07/28/2015   S/P right THA, 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   Type 2 diabetes mellitus (HCC) 05/18/2014   DDD (degenerative disc disease) 03/18/2013   Metabolic syndrome 07/29/2012   Essential hypertension, benign 07/18/2012   Hyperlipidemia 07/18/2012   Obstructive sleep apnea 07/04/2012    Osteoporosis 07/04/2012   Morbid obesity (HCC) 07/04/2012    PCP: Philippe Slade NP  REFERRING PROVIDER: Tonita Blanch DO  REFERRING DIAG: worsening over the past 2 years  Rationale for Evaluation and Treatment: Rehabilitation  THERAPY DIAG:  Pain in thoracic spine  Other low back pain  Muscle weakness (generalized)  Other abnormalities of gait and mobility  Difficulty walking  ONSET DATE:  Has been increasing in intensity over the past few years   SUBJECTIVE:                                                                                                                                                                                           SUBJECTIVE STATEMENT: Since seeing PT last, pt has seen multiple doctors and specialists and results/diagnoses for the leg locking has been unclear. The patient states that her legs lock up in the middle of walking and she feels a burning sensation up to 12x a day now. She feels as though she has no circulation in her legs and has to stop to let things flow again. Changes in positioning when seated seems to have no effect and seated duration also does not seem to have an impact. The time till onset of sx when walking fluctuates and is hard to predict.   Pt states that the sx happen the least when they are working out however due to cold weather they have not been to the gym as regularly in the past month.   Further questions revealed that pt has had swelling in ankle for years and she is very sensitive to pressure.   In re: to imaging/testing - her vascular system has been cleared and lumbar imaging did not show any significant findings concordant with her sx. No brain scan has been completed.   PERTINENT HISTORY:  Bilateral knee TKA, Aortoiliac occlusive disease (HCC) , Aorta Bypass, DMII, gout, Osteoporosis, Total Hip arthroplasty,   PAIN:  Are you having pain? No  PRECAUTIONS: None  RED FLAGS: None   WEIGHT BEARING  RESTRICTIONS: No  FALLS:  Has patient fallen in last 6 months?  No  OCCUPATION: Retired  PLOF: Independent with basic ADLs  PATIENT GOALS: To improve balance and dec LE sx.   NEXT MD VISIT: --- Brain scan coming up  OBJECTIVE:  Note: Objective measures were completed at Evaluation unless otherwise noted.  DIAGNOSTIC FINDINGS:  ---  COGNITION: Overall cognitive status: Within functional limits for tasks assessed     SENSATION: WFL  POSTURE: No Significant postural limitations  PALPATION: Palpation of the L4-L5 region caused significant pain. Patient reports she has issues with tenderness to palpation at baseline.   LUMBAR ROM:   AROM eval  Flexion WNL  Extension WNL  Right lateral flexion WNL  Left lateral flexion WNL  Right rotation WNL  Left rotation WNL   (Blank rows = not tested)  LOWER EXTREMITY ROM:   FADDIR performed  Active  Right eval Left eval  Hip flexion WNL WNL  Hip extension    Hip abduction    Hip adduction WNL WNL  Hip internal rotation    Hip external rotation WNL WNL  Knee flexion    Knee extension    Ankle dorsiflexion    Ankle plantarflexion    Ankle inversion    Ankle eversion     (Blank rows = not tested)  LOWER EXTREMITY MMT:    MMT Right eval Left eval  Hip flexion 19.5 19.8  Hip extension    Hip abduction 13.2 28  Hip adduction    Hip internal rotation    Hip external rotation    Knee flexion 14.5 23.5  Knee extension    Ankle dorsiflexion    Ankle plantarflexion    Ankle inversion    Ankle eversion     (Blank rows = not tested)  GAIT: Distance walked: 600 ft  Level of assistance: Complete Independence Comments: Pt was able to walk 600 feet before symptoms were reproduced. Gait was slow but steady. When pt had to stop due to sx, pt was able to maintain balance with moderate independence - had to lean against the wall while standing but didn't need PT assistance.  Balance: independent. Tandem gait eyes closed  required min assist.   Janee Milus _ for symptoms    TREATMENT DATE: 1/19                                                                                                                              Forward flexion stretch    PATIENT EDUCATION:  Education details: decompression of the spine, progression of activity, POC  Person educated: Patient Education method: Explanation Education comprehension: verbalized understanding  HOME EXERCISE PROGRAM: Forward flexion stretch 5x 5 sec hold  Lateral stretch 5x 5 sec hold   ASSESSMENT:  CLINICAL IMPRESSION: Patient is a 69 y.o. woman who was seen today for physical therapy evaluation and treatment for gait instability. She had a reproduction of the symptoms with ambulation > 600'. We will test again next visit. She has full hip movement  without reproduction of symptoms. She has decreased strength compared to the last episode of care. She reports she has not been to the gym in a month because of the cold. She has been cleared by her vascular doctor although her signs and symptoms seem to lean more towards a vascular/claudication issue. She has hypersensitivity to light touch in her lumbar spine although she reports this is baseline.     OBJECTIVE IMPAIRMENTS: decreased balance, decreased endurance, difficulty walking, and decreased strength.   ACTIVITY LIMITATIONS: carrying  PARTICIPATION LIMITATIONS: After sitting for varying times and in varying positions - will feel leg stiffen and spasm after walking for a varying amount of time.   PERSONAL FACTORS: Age and Time since onset of injury/illness/exacerbation are also affecting patient's functional outcome.   REHAB POTENTIAL: Good  CLINICAL DECISION MAKING: Progressive increase in   EVALUATION COMPLEXITY: Moderate   GOALS: Goals reviewed with patient? Yes  SHORT TERM GOALS: Target date: 07/21/2024  Pt will be able to inc walking distance by 200 ft before onset of  sx Baseline: Goal status: INITIAL  2.  Pt will report a decrease in sx presentation from 12x a day Baseline:  Goal status: INITIAL  3.  Pt will increase strength to levels consistent with her previous MMT levels  Baseline:  Goal status: INITIAL  4.  Pt will have equal bilat LE strength  Baseline:  Goal status: INITIAL  5.  Pt will improve balance and will be able to stand in tandem with eyes closed with no assist  Baseline:  Goal status: INITIAL  LONG TERM GOALS: Target date: 07/21/2024   Pt will have dec sx presentation in order to get back to ADLs without fear of falling Baseline:  Goal status: INITIAL  2.  Pt will inc balance with eyes closed and in tandem stance to help maintain balance during sx flare-ups  Baseline:  Goal status: INITIAL  3.  Pt will be able to ambulate community distances without symptom flare-up  Baseline:  Goal status: INITIAL    PLAN:  PT FREQUENCY: 1-2x/week  PT DURATION: 8 weeks  PLANNED INTERVENTIONS: 97110-Therapeutic exercises, 97530- Therapeutic activity, [id number]- Neuromuscular re-education, 97535- Self Care, [id number]- Manual therapy, [id number]- Gait training, [phone] (1-2 muscles), [id number] (3+ muscles)- Dry Needling, Patient/Family education, Balance training, and Vestibular training.  PLAN FOR NEXT SESSION: Gait training and retest distance pt able to walk before sx onset. General LE strengthening. Core stability exercises. Progression back into the gym as tolerated    Alm JINNY Don, PT 05/26/2024, 8:06 AM  "

## 2024-05-26 ENCOUNTER — Encounter (HOSPITAL_BASED_OUTPATIENT_CLINIC_OR_DEPARTMENT_OTHER): Payer: Self-pay | Admitting: Physical Therapy

## 2024-06-02 ENCOUNTER — Ambulatory Visit (HOSPITAL_BASED_OUTPATIENT_CLINIC_OR_DEPARTMENT_OTHER): Admitting: Physical Therapy

## 2024-06-02 ENCOUNTER — Encounter (HOSPITAL_BASED_OUTPATIENT_CLINIC_OR_DEPARTMENT_OTHER): Payer: Self-pay | Admitting: Physical Therapy

## 2024-06-02 DIAGNOSIS — M5459 Other low back pain: Secondary | ICD-10-CM

## 2024-06-02 DIAGNOSIS — M6281 Muscle weakness (generalized): Secondary | ICD-10-CM

## 2024-06-02 DIAGNOSIS — M546 Pain in thoracic spine: Secondary | ICD-10-CM

## 2024-06-02 DIAGNOSIS — R2689 Other abnormalities of gait and mobility: Secondary | ICD-10-CM

## 2024-06-02 DIAGNOSIS — R2681 Unsteadiness on feet: Secondary | ICD-10-CM | POA: Diagnosis not present

## 2024-06-02 NOTE — Therapy (Signed)
 OUTPATIENT PHYSICAL THERAPY THORACOLUMBAR EVALUATION     Patient Name: Diane Giles MRN: [id number] DOB:1955/12/26, 69 y.o., female Today's Date: 05/26/2024   END OF SESSION:   PT End of Session - 05/25/24 1436       Visit Number 4     Number of Visits 16     Date for Recertification  07/20/24     Authorization Type MCR     Activity Tolerance Patient tolerated treatment well     Behavior During Therapy [facility] for tasks assessed/performed                  Past Medical History:  Diagnosis Date   Aortic stenosis      mild-moderate AS 04/22/19 echo   Aortoiliac occlusive disease (HCC) 12/03/2016   Arthritis     Bowel obstruction (HCC)     Diabetes mellitus without complication (HCC)      pt states she has been told she is no longer diabetic and is not taking meds   Diverticulitis     Gout     Heart murmur     Hyperlipidemia     Hypertension     Osteoporosis     Sleep apnea     Tobacco use disorder 07/04/2012   Vitamin B12 deficiency               Past Surgical History:  Procedure Laterality Date   ABDOMINAL AORTOGRAM W/LOWER EXTREMITY N/A 12/03/2016    Procedure: Abdominal Aortogram w/Lower Extremity;  Surgeon: Eliza Lonni RAMAN, MD;  Location: [facility] INVASIVE CV LAB;  Service: Cardiovascular;  Laterality: N/A;   AORTA -INNOMIATE BYPASS N/A 05/18/2019    Procedure: AORTA -INNOMIATE BYPASS Using Hemashield Gold Graft Size 8mm;  Surgeon: Lucas Dorise POUR, MD;  Location: [facility] OR;  Service: Open Heart Surgery;  Laterality: N/A;   aorto- fem bypass Bilateral      aortobiiliac bypass in 2000   DILATATION & CURETTAGE/HYSTEROSCOPY WITH MYOSURE N/A 12/27/2015    Procedure: DILATATION & CURETTAGE/HYSTEROSCOPY;  Surgeon: Bobie FORBES Cathlyn JAYSON Nikki, MD;  Location: [facility];  Service: Gynecology;  Laterality: N/A;   DILATION AND CURETTAGE OF UTERUS   11/2015   ELBOW SURGERY Left ~2007    tendon repair by Dr. Kay   EYE SURGERY Bilateral 2008    lasik   JOINT REPLACEMENT        lower aortic bypass   2000    per patient   PERIPHERAL VASCULAR INTERVENTION   12/03/2016    Stenting of anastomotic stenosis of the aortoiliac and right common iliac artery with 7 x 39 mm VBX covered stent and postdilated with 9 mm balloon.   Right innonimate bypass   05/18/2019    Aorto innominate bypass surgery with median sternotomy   STERNOTOMY   05/18/2019    Right aorto-innonimate bypass surgery 05/18/19 Dr. Lucas   TOTAL HIP ARTHROPLASTY Right 07/27/2014    Procedure: RIGHT TOTAL HIP ARTHROPLASTY ANTERIOR APPROACH;  Surgeon: Donnice Car, MD;  Location: [facility];  Service: Orthopedics;  Laterality: Right;   TOTAL KNEE ARTHROPLASTY Right 05/28/2022    Procedure: RIGHT TOTAL KNEE ARTHROPLASTY;  Surgeon: Jerri Kay HERO, MD;  Location: [facility];  Service: Orthopedics;  Laterality: Right;   TOTAL KNEE ARTHROPLASTY Left 03/04/2023    Procedure: TOTAL KNEE ARTHROPLASTY;  Surgeon: Jerri Kay HERO, MD;  Location: [facility] OR;  Service: Orthopedics;  Laterality: Left;            Patient Active Problem List  Diagnosis Date Noted   Gout 10/22/2023   Anxiety 10/22/2023   Neuropathy 10/22/2023   SBO (small bowel obstruction) (HCC) 07/13/2023   Status post total left knee replacement 03/04/2023   Primary osteoarthritis of left knee 07/10/2022   Status post total right knee replacement 05/28/2022   Vitamin B12 deficiency 10/31/2021   Small bowel obstruction (HCC)     Partial small bowel obstruction (HCC) 10/28/2021   PAD (peripheral artery disease)     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   Aortoiliac occlusive disease (HCC) 12/03/2016   Centrilobular emphysema (HCC) 10/03/2016   Sacroiliitis 02/27/2016   Psoas tendinitis of right side 10/21/2015   Endometrial thickening on ultrasound 07/28/2015   S/P right THA, 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   Type 2 diabetes mellitus (HCC) 05/18/2014   DDD (degenerative disc disease) 03/18/2013   Metabolic syndrome 07/29/2012   Essential hypertension, benign  07/18/2012   Hyperlipidemia 07/18/2012   Obstructive sleep apnea 07/04/2012   Osteoporosis 07/04/2012   Morbid obesity (HCC) 07/04/2012      PCP: Philippe Slade NP   REFERRING PROVIDER: Tonita Blanch DO   REFERRING DIAG: worsening over the past 2 years   Rationale for Evaluation and Treatment: Rehabilitation   THERAPY DIAG:  Pain in thoracic spine   Other low back pain   Muscle weakness (generalized)   Other abnormalities of gait and mobility   Difficulty walking   ONSET DATE:  Has been increasing in intensity over the past few years    SUBJECTIVE:                                                                                                                                                                                            SUBJECTIVE STATEMENT: The patient reports her symptoms are about the same at this time.  She is having no significant back pain at this time.  She continues to have several instances a day with her she feels like her legs lock up.  She reports this has not changed since the initial eval.   Eval: Since seeing PT last, pt has seen multiple doctors and specialists and results/diagnoses for the leg locking has been unclear. The patient states that her legs lock up in the middle of walking and she feels a burning sensation up to 12x a day now. She feels as though she has no circulation in her legs and has to stop to let things flow again. Changes in positioning when seated seems to have no effect and seated duration also does not seem to have an impact. The time till onset of sx when walking fluctuates and is hard  to predict.    Pt states that the sx happen the least when they are working out however due to cold weather they have not been to the gym as regularly in the past month.    Further questions revealed that pt has had swelling in ankle for years and she is very sensitive to pressure.    In re: to imaging/testing - her vascular system has been  cleared and lumbar imaging did not show any significant findings concordant with her sx. No brain scan has been completed.    PERTINENT HISTORY:  Bilateral knee TKA, Aortoiliac occlusive disease (HCC) , Aorta Bypass, DMII, gout, Osteoporosis, Total Hip arthroplasty,    PAIN:  Are you having pain? No   PRECAUTIONS: None   RED FLAGS: None      WEIGHT BEARING RESTRICTIONS: No   FALLS:  Has patient fallen in last 6 months? No   OCCUPATION: Retired   PLOF: Independent with basic ADLs   PATIENT GOALS: To improve balance and dec LE sx.    NEXT MD VISIT: --- Brain scan coming up   OBJECTIVE:  Note: Objective measures were completed at Evaluation unless otherwise noted.   DIAGNOSTIC FINDINGS:  ---   COGNITION: Overall cognitive status: Within functional limits for tasks assessed                          SENSATION: WFL   POSTURE: No Significant postural limitations   PALPATION: Palpation of the L4-L5 region caused significant pain. Patient reports she has issues with tenderness to palpation at baseline.    LUMBAR ROM:    AROM eval  Flexion WNL  Extension WNL  Right lateral flexion WNL  Left lateral flexion WNL  Right rotation WNL  Left rotation WNL   (Blank rows = not tested)   LOWER EXTREMITY ROM:   FADDIR performed   Active  Right eval Left eval  Hip flexion WNL WNL  Hip extension      Hip abduction      Hip adduction WNL WNL  Hip internal rotation      Hip external rotation WNL WNL  Knee flexion      Knee extension      Ankle dorsiflexion      Ankle plantarflexion      Ankle inversion      Ankle eversion       (Blank rows = not tested)   LOWER EXTREMITY MMT:     MMT Right eval Left eval  Hip flexion 19.5 19.8  Hip extension      Hip abduction 13.2 28  Hip adduction      Hip internal rotation      Hip external rotation      Knee flexion 14.5 23.5  Knee extension      Ankle dorsiflexion      Ankle plantarflexion      Ankle inversion       Ankle eversion       (Blank rows = not tested)   GAIT: Distance walked: 600 ft  Level of assistance: Complete Independence Comments: Pt was able to walk 600 feet before symptoms were reproduced. Gait was slow but steady. When pt had to stop due to sx, pt was able to maintain balance with moderate independence - had to lean against the wall while standing but didn't need PT assistance.   Balance: independent. Tandem gait eyes closed required min assist.    Janee Milus _  for symptoms     TREATMENT DATE: TherEX: NuStep 4 minutes  Gait: Patient ambulated 900 feet without onset of symptoms.  Patient reported general fatigue but she reported that before she came in.  Neuromuscular reeducation: Air-ex: Tandem stance 2 x 30 seconds each leg min assist Narrow base of support eyes open and eyes closed contact-guard assist 2 x 30 seconds each side Step on step off Airex 2 x 10 each direction Airex heel toe 2 x 10  Postural reeducation: Row 2 x 10 red Shoulder extension 2 x 10 red both exercises with cueing for breathing and posture.  Will progress back into gym exercises as tolerated next visit.    1/19                                                                                                                              Forward flexion stretch      PATIENT EDUCATION:  Education details: decompression of the spine, progression of activity, POC  Person educated: Patient Education method: Explanation Education comprehension: verbalized understanding   HOME EXERCISE PROGRAM: Forward flexion stretch 5x 5 sec hold  Lateral stretch 5x 5 sec hold    ASSESSMENT:   CLINICAL IMPRESSION: Patient ambulated 900 feet without symptoms.  Treatment did not bring on symptoms today.  She reports overall she feels fatigued.  She did well with her balance exercises.  She required min assist for some of her balance exercises but for others just contact-guard.  Next visit we will add these into  her HEP.  We discussed buying her balance mat for home.  She showed improved balance as she practiced more.  She has had no low back pain.  Patient is a 69 y.o. woman who was seen today for physical therapy evaluation and treatment for gait instability. She had a reproduction of the symptoms with ambulation > 600'. We will test again next visit. She has full hip movement without reproduction of symptoms. She has decreased strength compared to the last episode of care. She reports she has not been to the gym in a month because of the cold. She has been cleared by her vascular doctor although her signs and symptoms seem to lean more towards a vascular/claudication issue. She has hypersensitivity to light touch in her lumbar spine although she reports this is baseline.        OBJECTIVE IMPAIRMENTS: decreased balance, decreased endurance, difficulty walking, and decreased strength.    ACTIVITY LIMITATIONS: carrying   PARTICIPATION LIMITATIONS: After sitting for varying times and in varying positions - will feel leg stiffen and spasm after walking for a varying amount of time.    PERSONAL FACTORS: Age and Time since onset of injury/illness/exacerbation are also affecting patient's functional outcome.    REHAB POTENTIAL: Good   CLINICAL DECISION MAKING: Progressive increase in    EVALUATION COMPLEXITY: Moderate     GOALS: Goals reviewed with patient? Yes   SHORT TERM GOALS:  Target date: 07/21/2024   Pt will be able to inc walking distance by 200 ft before onset of sx Baseline: Goal status: INITIAL   2.  Pt will report a decrease in sx presentation from 12x a day Baseline:  Goal status: INITIAL   3.  Pt will increase strength to levels consistent with her previous MMT levels  Baseline:  Goal status: INITIAL   4.  Pt will have equal bilat LE strength  Baseline:  Goal status: INITIAL   5.  Pt will improve balance and will be able to stand in tandem with eyes closed with no assist   Baseline:  Goal status: INITIAL   LONG TERM GOALS: Target date: 07/21/2024     Pt will have dec sx presentation in order to get back to ADLs without fear of falling Baseline:  Goal status: INITIAL   2.  Pt will inc balance with eyes closed and in tandem stance to help maintain balance during sx flare-ups  Baseline:  Goal status: INITIAL   3.  Pt will be able to ambulate community distances without symptom flare-up  Baseline:  Goal status: INITIAL       PLAN:   PT FREQUENCY: 1-2x/week   PT DURATION: 8 weeks   PLANNED INTERVENTIONS: 97110-Therapeutic exercises, 97530- Therapeutic activity, [id number]- Neuromuscular re-education, 97535- Self Care, 02859- Manual therapy, [id number]- Gait training, [phone] (1-2 muscles), [id number] (3+ muscles)- Dry Needling, Patient/Family education, Balance training, and Vestibular training.   PLAN FOR NEXT SESSION: Gait training and retest distance pt able to walk before sx onset. General LE strengthening. Core stability exercises. Progression back into the gym as tolerated   Alm Don PT DPT

## 2024-06-03 MED ORDER — PRALUENT 75 MG/ML ~~LOC~~ SOAJ: 75.0000 mg | SUBCUTANEOUS | 1 refills | Status: AC

## 2024-06-03 NOTE — Addendum Note (Signed)
 Addended by: Dearra Myhand L on: 06/03/2024 03:12 PM   Modules accepted: Orders

## 2024-06-08 ENCOUNTER — Other Ambulatory Visit

## 2024-06-09 ENCOUNTER — Other Ambulatory Visit

## 2024-06-09 ENCOUNTER — Ambulatory Visit (HOSPITAL_BASED_OUTPATIENT_CLINIC_OR_DEPARTMENT_OTHER): Admitting: Physical Therapy

## 2024-06-09 ENCOUNTER — Encounter (HOSPITAL_BASED_OUTPATIENT_CLINIC_OR_DEPARTMENT_OTHER): Payer: Self-pay | Admitting: Physical Therapy

## 2024-06-09 DIAGNOSIS — E663 Overweight: Secondary | ICD-10-CM | POA: Diagnosis not present

## 2024-06-09 DIAGNOSIS — E1165 Type 2 diabetes mellitus with hyperglycemia: Secondary | ICD-10-CM

## 2024-06-09 DIAGNOSIS — R2689 Other abnormalities of gait and mobility: Secondary | ICD-10-CM

## 2024-06-09 DIAGNOSIS — I10 Essential (primary) hypertension: Secondary | ICD-10-CM | POA: Diagnosis not present

## 2024-06-09 DIAGNOSIS — M6281 Muscle weakness (generalized): Secondary | ICD-10-CM

## 2024-06-09 DIAGNOSIS — M5459 Other low back pain: Secondary | ICD-10-CM

## 2024-06-09 DIAGNOSIS — E782 Mixed hyperlipidemia: Secondary | ICD-10-CM

## 2024-06-09 DIAGNOSIS — R262 Difficulty in walking, not elsewhere classified: Secondary | ICD-10-CM

## 2024-06-09 LAB — CBC WITH DIFFERENTIAL/PLATELET
Basophils Absolute: 0 10*3/uL (ref 0.0–0.1)
Basophils Relative: 0.7 % (ref 0.0–3.0)
Eosinophils Absolute: 0.1 10*3/uL (ref 0.0–0.7)
Eosinophils Relative: 2 % (ref 0.0–5.0)
HCT: 38.7 % (ref 36.0–46.0)
Hemoglobin: 12.9 g/dL (ref 12.0–15.0)
Lymphocytes Relative: 11.4 % — ABNORMAL LOW (ref 12.0–46.0)
Lymphs Abs: 0.7 10*3/uL (ref 0.7–4.0)
MCHC: 33.5 g/dL (ref 30.0–36.0)
MCV: 90.2 fl (ref 78.0–100.0)
Monocytes Absolute: 0.4 10*3/uL (ref 0.1–1.0)
Monocytes Relative: 6.7 % (ref 3.0–12.0)
Neutro Abs: 4.8 10*3/uL (ref 1.4–7.7)
Neutrophils Relative %: 79.2 % — ABNORMAL HIGH (ref 43.0–77.0)
Platelets: 226 10*3/uL (ref 150.0–400.0)
RBC: 4.29 Mil/uL (ref 3.87–5.11)
RDW: 14.3 % (ref 11.5–15.5)
WBC: 6.1 10*3/uL (ref 4.0–10.5)

## 2024-06-09 LAB — LIPID PANEL
Cholesterol: 99 mg/dL (ref 28–200)
HDL: 52.6 mg/dL
LDL Cholesterol: 26 mg/dL (ref 10–99)
NonHDL: 46.42
Total CHOL/HDL Ratio: 2
Triglycerides: 100 mg/dL (ref 10.0–149.0)
VLDL: 20 mg/dL (ref 0.0–40.0)

## 2024-06-09 LAB — COMPREHENSIVE METABOLIC PANEL WITH GFR
ALT: 13 U/L (ref 3–35)
AST: 15 U/L (ref 5–37)
Albumin: 3.8 g/dL (ref 3.5–5.2)
Alkaline Phosphatase: 125 U/L — ABNORMAL HIGH (ref 39–117)
BUN: 17 mg/dL (ref 6–23)
CO2: 27 meq/L (ref 19–32)
Calcium: 9.5 mg/dL (ref 8.4–10.5)
Chloride: 103 meq/L (ref 96–112)
Creatinine, Ser: 1.24 mg/dL — ABNORMAL HIGH (ref 0.40–1.20)
GFR: 43.96 mL/min — ABNORMAL LOW
Glucose, Bld: 101 mg/dL — ABNORMAL HIGH (ref 70–99)
Potassium: 4.3 meq/L (ref 3.5–5.1)
Sodium: 138 meq/L (ref 135–145)
Total Bilirubin: 0.4 mg/dL (ref 0.2–1.2)
Total Protein: 6.4 g/dL (ref 6.0–8.3)

## 2024-06-09 LAB — HEMOGLOBIN A1C: Hgb A1c MFr Bld: 5.6 % (ref 4.6–6.5)

## 2024-06-10 ENCOUNTER — Encounter (HOSPITAL_BASED_OUTPATIENT_CLINIC_OR_DEPARTMENT_OTHER): Payer: Self-pay | Admitting: Physical Therapy

## 2024-06-10 ENCOUNTER — Ambulatory Visit: Payer: Self-pay | Admitting: Family Medicine

## 2024-06-11 ENCOUNTER — Ambulatory Visit: Admitting: Neurology

## 2024-06-11 DIAGNOSIS — R2681 Unsteadiness on feet: Secondary | ICD-10-CM | POA: Diagnosis not present

## 2024-06-11 NOTE — Procedures (Signed)
" °  [facility]  [address] [place], [address]  [place], [place] [address] Tel: [phone] Fax: [phone] Test Date:  06/11/2024  Patient: Unice Vantassel DOB: 02-15-1956 Physician: Tonita Blanch, DO  Sex: Female Height: 4' 10 Ref Phys: Tonita Blanch, DO  ID#: [id number]   Technician:    History: This is a 69 year old female referred for evaluation of gait instability and leg weakness.  NCV & EMG Findings: Extensive electrodiagnostic testing of the right lower extremity shows:  Right sural and superficial peroneal sensory responses are within normal limits. Right peroneal and tibial motor responses are within normal limits. Right tibial H reflex study is within normal limits. There is no evidence of active or chronic motor axonal loss changes affecting any of the tested muscles.  Motor unit configuration and recruitment pattern is within normal limits.  Impression: This is a normal study of the right lower extremity.  In particular, there is no evidence of a lumbosacral radiculopathy, diffuse myopathy, or large fiber sensorimotor polyneuropathy.    ___________________________ Tonita Blanch, DO    Nerve Conduction Studies   Stim Site NR Peak (ms) Norm Peak (ms) O-P Amp (V) Norm O-P Amp  Right Sup Peroneal Anti Sensory (Ant Lat Mall)  32 C  12 cm    2.6 <4.6 16.6 >3  Right Sural Anti Sensory (Lat Mall)  32 C  Calf    2.4 <4.6 18.6 >3     Stim Site NR Onset (ms) Norm Onset (ms) O-P Amp (mV) Norm O-P Amp Site1 Site2 Delta-0 (ms) Dist (cm) Vel (m/s) Norm Vel (m/s)  Right Peroneal Motor (Ext Dig Brev)  32 C  Ankle    3.0 <6.0 2.5 >2.5 B Fib Ankle 7.5 32.0 43 >40  B Fib    10.5  2.3  Poplt B Fib 1.5 7.0 47 >40  Poplt    12.0  2.3         Right Peroneal TA Motor (Tib Ant)  32 C  Fib Head    3.2 <4.5 5.6 >3 Poplit Fib Head 1.3 7.0 54 >40  Poplit    4.5 <5.7 5.2         Right Tibial Motor (Abd Hall Brev)  32 C  Ankle    4.8 <6.0 10.5 >4 Knee Ankle 6.7 39.0 58 >40   Knee    11.5  8.4          Electromyography   Side Muscle Ins.Act Fibs Fasc Recrt Amp Dur Poly Activation Comment  Right AntTibialis Nml Nml Nml Nml Nml Nml Nml Nml N/A  Right Gastroc Nml Nml Nml Nml Nml Nml Nml Nml N/A  Right Flex Dig Long Nml Nml Nml Nml Nml Nml Nml Nml N/A  Right RectFemoris Nml Nml Nml Nml Nml Nml Nml Nml N/A  Right BicepsFemS Nml Nml Nml Nml Nml Nml Nml Nml N/A  Right GluteusMed Nml Nml Nml Nml Nml Nml Nml Nml N/A      Waveforms:               "

## 2024-06-12 ENCOUNTER — Ambulatory Visit: Payer: Self-pay | Admitting: Neurology

## 2024-06-15 ENCOUNTER — Ambulatory Visit (HOSPITAL_BASED_OUTPATIENT_CLINIC_OR_DEPARTMENT_OTHER): Admitting: Physical Therapy

## 2024-07-08 ENCOUNTER — Encounter (HOSPITAL_BASED_OUTPATIENT_CLINIC_OR_DEPARTMENT_OTHER): Admitting: Physical Therapy

## 2024-07-13 ENCOUNTER — Ambulatory Visit

## 2024-07-16 ENCOUNTER — Encounter (HOSPITAL_BASED_OUTPATIENT_CLINIC_OR_DEPARTMENT_OTHER): Admitting: Physical Therapy

## 2024-07-23 ENCOUNTER — Encounter (HOSPITAL_BASED_OUTPATIENT_CLINIC_OR_DEPARTMENT_OTHER): Admitting: Physical Therapy

## 2024-07-27 ENCOUNTER — Ambulatory Visit: Admitting: Cardiology

## 2024-07-30 ENCOUNTER — Encounter (HOSPITAL_BASED_OUTPATIENT_CLINIC_OR_DEPARTMENT_OTHER): Admitting: Physical Therapy

## 2024-12-16 ENCOUNTER — Ambulatory Visit: Admitting: Family Medicine

## 2025-02-26 ENCOUNTER — Ambulatory Visit: Admitting: Physician Assistant
